# Patient Record
Sex: Female | Born: 1970 | Race: Black or African American | Hispanic: No | Marital: Single | Smoking: Never smoker
Health system: Southern US, Community
[De-identification: ages and names within clinical notes are randomized; demographics above are authoritative.]

## PROBLEM LIST (undated history)

## (undated) ENCOUNTER — Emergency Department (HOSPITAL_COMMUNITY): Admission: EM | Payer: Medicaid Other | Source: Home / Self Care

## (undated) DIAGNOSIS — E43 Unspecified severe protein-calorie malnutrition: Secondary | ICD-10-CM

## (undated) DIAGNOSIS — J189 Pneumonia, unspecified organism: Secondary | ICD-10-CM

## (undated) DIAGNOSIS — C169 Malignant neoplasm of stomach, unspecified: Secondary | ICD-10-CM

## (undated) DIAGNOSIS — R198 Other specified symptoms and signs involving the digestive system and abdomen: Secondary | ICD-10-CM

## (undated) DIAGNOSIS — M3500 Sicca syndrome, unspecified: Secondary | ICD-10-CM

## (undated) DIAGNOSIS — IMO0002 Reserved for concepts with insufficient information to code with codable children: Secondary | ICD-10-CM

## (undated) DIAGNOSIS — T7840XA Allergy, unspecified, initial encounter: Secondary | ICD-10-CM

## (undated) DIAGNOSIS — K219 Gastro-esophageal reflux disease without esophagitis: Secondary | ICD-10-CM

## (undated) DIAGNOSIS — L309 Dermatitis, unspecified: Secondary | ICD-10-CM

## (undated) DIAGNOSIS — D649 Anemia, unspecified: Secondary | ICD-10-CM

## (undated) DIAGNOSIS — R000 Tachycardia, unspecified: Secondary | ICD-10-CM

## (undated) DIAGNOSIS — M329 Systemic lupus erythematosus, unspecified: Secondary | ICD-10-CM

---

## 1997-05-05 ENCOUNTER — Other Ambulatory Visit: Admission: RE | Admit: 1997-05-05 | Discharge: 1997-05-05 | Payer: Self-pay | Admitting: Obstetrics and Gynecology

## 1998-07-18 ENCOUNTER — Emergency Department (HOSPITAL_COMMUNITY): Admission: EM | Admit: 1998-07-18 | Discharge: 1998-07-18 | Payer: Self-pay | Admitting: Emergency Medicine

## 1998-07-18 ENCOUNTER — Encounter: Payer: Self-pay | Admitting: Emergency Medicine

## 1999-04-01 ENCOUNTER — Other Ambulatory Visit: Admission: RE | Admit: 1999-04-01 | Discharge: 1999-04-01 | Payer: Self-pay | Admitting: Family Medicine

## 1999-08-23 ENCOUNTER — Inpatient Hospital Stay (HOSPITAL_COMMUNITY): Admission: EM | Admit: 1999-08-23 | Discharge: 1999-08-24 | Payer: Self-pay | Admitting: Emergency Medicine

## 1999-08-23 ENCOUNTER — Encounter: Payer: Self-pay | Admitting: Internal Medicine

## 2001-01-11 ENCOUNTER — Other Ambulatory Visit: Admission: RE | Admit: 2001-01-11 | Discharge: 2001-01-11 | Payer: Self-pay | Admitting: Obstetrics and Gynecology

## 2001-03-20 ENCOUNTER — Emergency Department (HOSPITAL_COMMUNITY): Admission: EM | Admit: 2001-03-20 | Discharge: 2001-03-20 | Payer: Self-pay | Admitting: *Deleted

## 2007-06-30 ENCOUNTER — Emergency Department (HOSPITAL_COMMUNITY): Admission: EM | Admit: 2007-06-30 | Discharge: 2007-06-30 | Payer: Self-pay | Admitting: Emergency Medicine

## 2011-07-19 ENCOUNTER — Emergency Department (HOSPITAL_COMMUNITY)
Admission: EM | Admit: 2011-07-19 | Discharge: 2011-07-20 | Disposition: A | Discharge status: Home / Self Care | Payer: No Typology Code available for payment source | Attending: Emergency Medicine | Admitting: Emergency Medicine

## 2011-07-19 ENCOUNTER — Encounter (HOSPITAL_COMMUNITY): Payer: Self-pay | Admitting: Emergency Medicine

## 2011-07-19 ENCOUNTER — Emergency Department (INDEPENDENT_AMBULATORY_CARE_PROVIDER_SITE_OTHER)
Admission: EM | Admit: 2011-07-19 | Discharge: 2011-07-19 | Disposition: A | Discharge status: Nursing Home (Includes ICF) | Payer: PRIVATE HEALTH INSURANCE | Source: Home / Self Care | Attending: Family Medicine | Admitting: Family Medicine

## 2011-07-19 ENCOUNTER — Encounter (HOSPITAL_COMMUNITY): Payer: Self-pay | Admitting: *Deleted

## 2011-07-19 DIAGNOSIS — M791 Myalgia, unspecified site: Secondary | ICD-10-CM

## 2011-07-19 DIAGNOSIS — M2550 Pain in unspecified joint: Secondary | ICD-10-CM

## 2011-07-19 DIAGNOSIS — IMO0001 Reserved for inherently not codable concepts without codable children: Secondary | ICD-10-CM | POA: Insufficient documentation

## 2011-07-19 DIAGNOSIS — J45909 Unspecified asthma, uncomplicated: Secondary | ICD-10-CM | POA: Insufficient documentation

## 2011-07-19 HISTORY — DX: Dermatitis, unspecified: L30.9

## 2011-07-19 NOTE — ED Provider Notes (Signed)
History     CSN: [id number]  Arrival date & time 07/19/11  4098   First MD Initiated Contact with Patient 07/19/11 1952      Chief Complaint  Patient presents with  . Joint Pain    (Consider location/radiation/quality/duration/timing/severity/associated sxs/prior treatment) HPI Comments: 41 year old female with history of asthma and eczema. Here complaining of generalized joint pain and swelling. Started with right hip pain about 2 months ago pain progressively worse affecting several joints including right knee and both ankles, shoulders and elbows. She takes ibuprofen over-the-counter 200 mg 5 pills twice a day that her pain has become intractable. She is here complaining of severe generalized joint pain, unable to move or walk due to pain. Denies fever, vomiting, diarrhea. Has had episodes of dizziness. Denies numbness or weakness. Does not have a primary care provider. Denies similar episodes in the past.   Past Medical History  Diagnosis Date  . Asthma   . Eczema     History reviewed. No pertinent past surgical history.  History reviewed. No pertinent family history.  History  Substance Use Topics  . Smoking status: Never Smoker   . Smokeless tobacco: Not on file  . Alcohol Use: Yes    OB History    Grav Para Term Preterm Abortions TAB SAB Ect Mult Living                  Review of Systems  Constitutional: Positive for fatigue. Negative for fever, chills and appetite change.       No sweats, No cold or heat intolerance.  HENT: Positive for neck pain. Negative for sore throat.   Gastrointestinal: Negative for nausea and vomiting.  Genitourinary: Positive for vaginal bleeding. Negative for dysuria, frequency, hematuria and flank pain.       Patient currently with her menstrual period. No polyuria.  Musculoskeletal: Positive for joint swelling and arthralgias.  Skin: Negative for rash.  Neurological: Positive for dizziness. Negative for seizures, weakness,  numbness and headaches.    Allergies  Azithromycin and Sulfa antibiotics  Home Medications   Current Outpatient Rx  Name Route Sig Dispense Refill  . IBUPROFEN 800 MG PO TABS Oral Take 800 mg by mouth every 8 (eight) hours as needed. For pain.      BP 123/59  Pulse 98  Temp(Src) 98.8 F (37.1 C) (Oral)  Resp 20  SpO2 100%  LMP 07/15/2011  Physical Exam  Nursing note and vitals reviewed. Constitutional: She is oriented to person, place, and time. She appears well-developed and well-nourished.       Sitting in wheelchair. Frequent grimacing, appears stiff, robotic-like movements. Responds appropriately to questions.  HENT:  Head: Normocephalic and atraumatic.  Mouth/Throat: Oropharynx is clear and moist. No oropharyngeal exudate.  Eyes: Conjunctivae and EOM are normal. Pupils are equal, round, and reactive to light. No scleral icterus.  Neck: No thyromegaly present.       Limited range of motion due to reported pain  Cardiovascular: Normal heart sounds.   Pulmonary/Chest: Breath sounds normal.  Musculoskeletal:       Generalized joint pain, sever impairment of movement due to reported pain. Patient unable to transfer from chair to table. Unable to walk. No obvious joint swelling on exam.  Lymphadenopathy:    She has no cervical adenopathy.  Neurological: She is alert and oriented to person, place, and time.  Skin: No rash noted.    ED Course  Procedures (including critical care time)  Labs Reviewed - No data to  display No results found.   1. Arthralgia of multiple joints       MDM  41 year old female here with generalized arthralgia can barely moves, unable to walk due to reported severe pain. No primary doctor. Decided to transfer to the emergency department for further evaluation.        Sharin Grave, MD 07/19/11 2230

## 2011-07-19 NOTE — ED Notes (Signed)
Pt with c/o body pain started right leg 2 months ago - now has progressed to left side - from head to toe - taking ibuprofen

## 2011-07-19 NOTE — ED Notes (Signed)
Patient with generalized pain everywhere on body.  Patient mentions feet, joint areas increasing in pain.  Patient states that this has been going on for about one month.

## 2011-07-19 NOTE — ED Notes (Signed)
Pt from wheelchair to exam table and back to wheelchair with assistance moving slowly - c/o pain bottom of feet and back with movement

## 2011-07-20 ENCOUNTER — Encounter (HOSPITAL_COMMUNITY): Payer: Self-pay | Admitting: *Deleted

## 2011-07-20 LAB — URINE MICROSCOPIC-ADD ON

## 2011-07-20 LAB — URINALYSIS, ROUTINE W REFLEX MICROSCOPIC
Bilirubin Urine: NEGATIVE
Glucose, UA: NEGATIVE mg/dL
Ketones, ur: NEGATIVE mg/dL
Nitrite: NEGATIVE
Specific Gravity, Urine: 1.012 (ref 1.005–1.030)
pH: 7.5 (ref 5.0–8.0)

## 2011-07-20 LAB — POCT I-STAT, CHEM 8
BUN: 9 mg/dL (ref 6–23)
Creatinine, Ser: 0.6 mg/dL (ref 0.50–1.10)
Glucose, Bld: 102 mg/dL — ABNORMAL HIGH (ref 70–99)
Sodium: 140 mEq/L (ref 135–145)
TCO2: 27 mmol/L (ref 0–100)

## 2011-07-20 LAB — CBC
HCT: 27.6 % — ABNORMAL LOW (ref 36.0–46.0)
Hemoglobin: 8.7 g/dL — ABNORMAL LOW (ref 12.0–15.0)
MCV: 73.8 fL — ABNORMAL LOW (ref 78.0–100.0)
RBC: 3.74 MIL/uL — ABNORMAL LOW (ref 3.87–5.11)
WBC: 7.8 10*3/uL (ref 4.0–10.5)

## 2011-07-20 LAB — DIFFERENTIAL
Basophils Absolute: 0 10*3/uL (ref 0.0–0.1)
Eosinophils Relative: 1 % (ref 0–5)
Lymphocytes Relative: 29 % (ref 12–46)
Lymphs Abs: 2.3 10*3/uL (ref 0.7–4.0)
Monocytes Absolute: 0.9 10*3/uL (ref 0.1–1.0)
Monocytes Relative: 12 % (ref 3–12)
Neutro Abs: 4.5 10*3/uL (ref 1.7–7.7)

## 2011-07-20 MED ORDER — MELOXICAM 7.5 MG PO TABS: 7.5000 mg | ORAL_TABLET | Freq: Two times a day (BID) | ORAL | Status: DC

## 2011-07-20 MED ORDER — SODIUM CHLORIDE 0.9 % IV BOLUS (SEPSIS)
1000.0000 mL | Freq: Once | INTRAVENOUS | Status: AC
  Administered 2011-07-20: 1000 mL via INTRAVENOUS

## 2011-07-20 NOTE — ED Notes (Signed)
Pt alert, NAD, calm, interactive, skin W&D, resps e/u, speaking in clear complete sentences, pt seen by EDNP prior to RN assessment, see NP notes, orders received and initiated. Pt here for generalized pain, head, neck, joints, back and extremeties, ongoing for ~ 3 months, (denies: fever or nvd).

## 2011-07-20 NOTE — ED Provider Notes (Signed)
History     CSN: [id number]  Arrival date & time 07/19/11  2152   First MD Initiated Contact with Patient 07/20/11 0054      Chief Complaint  Patient presents with  . Joint Pain    (Consider location/radiation/quality/duration/timing/severity/associated sxs/prior treatment) HPI Comments: Andrea Best is a very pleasant 41 year old, African American female, who reports 2 months of progressive myalgias.  She states it started in her right thigh and progressed into her foot and up into her neck.  It is now involving both arms both legs.  His feet.  She is finding it increasingly difficult to ambulate.  She has been taking 5 200 mg tablets of ibuprofen in the morning and repeating this again at night just to get through the day.  He denies appetite change weight loss, dysuria, dyspepsia, probably due to see, polyuria, constipation, diarrhea, shortness of breath, chest pain, visual disturbances, hearing disturbances.  The history is provided by the patient.    Past Medical History  Diagnosis Date  . Asthma   . Eczema     History reviewed. No pertinent past surgical history.  History reviewed. No pertinent family history.  History  Substance Use Topics  . Smoking status: Never Smoker   . Smokeless tobacco: Not on file  . Alcohol Use: Yes    OB History    Grav Para Term Preterm Abortions TAB SAB Ect Mult Living                  Review of Systems  Constitutional: Negative for appetite change and unexpected weight change.  Genitourinary: Negative for menstrual problem.  Musculoskeletal: Positive for myalgias, back pain, arthralgias and gait problem. Negative for joint swelling.  Skin: Negative for pallor and wound.  Neurological: Negative for dizziness and headaches.    Allergies  Azithromycin and Sulfa antibiotics  Home Medications   Current Outpatient Rx  Name Route Sig Dispense Refill  . IBUPROFEN 800 MG PO TABS Oral Take 800 mg by mouth every 8 (eight) hours as  needed. For pain.      BP 115/77  Pulse 96  Temp(Src) 98.4 F (36.9 C) (Oral)  Resp 20  SpO2 100%  LMP 07/15/2011  Physical Exam  Constitutional: She is oriented to person, place, and time. She appears well-developed and well-nourished. No distress.  HENT:  Head: Normocephalic and atraumatic.  Eyes: Pupils are equal, round, and reactive to light.  Neck: Normal range of motion. No thyromegaly present.  Cardiovascular: Normal rate.   Pulmonary/Chest: Effort normal.  Abdominal: She exhibits no distension. There is no tenderness.  Musculoskeletal: Normal range of motion. She exhibits no edema and no tenderness.  Neurological: She is alert and oriented to person, place, and time.  Skin: Skin is warm and dry. No rash noted. There is pallor.    ED Course  Procedures (including critical care time)  Labs Reviewed  CBC - Abnormal; Notable for the following:    RBC 3.74 (*)    Hemoglobin 8.7 (*)    HCT 27.6 (*)    MCV 73.8 (*)    MCH 23.3 (*)    All other components within normal limits  POCT I-STAT, CHEM 8 - Abnormal; Notable for the following:    Glucose, Bld 102 (*)    Hemoglobin 10.2 (*)    HCT 30.0 (*)    All other components within normal limits  DIFFERENTIAL  URINALYSIS, ROUTINE W REFLEX MICROSCOPIC  ANA  LUPUS ANTICOAGULANT PANEL  CK   No  results found.   No diagnosis found.    MDM  During a myositis versus a lupus versus an arthritic process.  I have requested these labs unfortunately, they would not be resulted today .  I did speak with family practice, who has accepted this patient into their practice.  They will call her to set an appointment and also spoken with the patient and her family about this.  They are agreeable I will send this patient home with a prescription for meloxicam 7.5 mg twice a day to replace her 5 tablets of over-the-counter ibuprofen twice a day.        Arman Filter, NP 07/20/11 [phone]

## 2011-07-20 NOTE — ED Provider Notes (Signed)
Medical screening examination/treatment/procedure(s) were performed by non-physician practitioner and as supervising physician I was immediately available for consultation/collaboration.   Sunnie Nielsen, MD 07/20/11 [phone]

## 2011-07-21 LAB — ANTI-NUCLEAR AB-TITER (ANA TITER): ANA Titer 1: 1:320 {titer} — ABNORMAL HIGH

## 2011-07-21 LAB — LUPUS ANTICOAGULANT PANEL: PTT Lupus Anticoagulant: 36.5 secs (ref 28.0–43.0)

## 2011-07-28 ENCOUNTER — Encounter: Payer: Self-pay | Admitting: Family Medicine

## 2011-07-28 ENCOUNTER — Ambulatory Visit (INDEPENDENT_AMBULATORY_CARE_PROVIDER_SITE_OTHER): Payer: PRIVATE HEALTH INSURANCE | Admitting: Family Medicine

## 2011-07-28 VITALS — BP 124/79 | HR 103 | Temp 98.0°F | Ht 65.5 in | Wt 178.0 lb

## 2011-07-28 DIAGNOSIS — M2550 Pain in unspecified joint: Secondary | ICD-10-CM

## 2011-07-28 DIAGNOSIS — Z7689 Persons encountering health services in other specified circumstances: Secondary | ICD-10-CM

## 2011-07-28 MED ORDER — MELOXICAM 15 MG PO TABS: 15.0000 mg | ORAL_TABLET | Freq: Every day | ORAL | Status: AC

## 2011-07-28 NOTE — Patient Instructions (Signed)
It was nice to meet you today! I am sorry you are not feeling well.   Continue taking the Meloxicam 1-2 tablets for the pain. You can also take Tylenol in addition, if you need it. Use heat as needed.  Please take iron tablets for your anemia.  Come back to see me in 2-3 weeks, unless you are feeling all better. We can do some labs at that time if you are still having pain.  Please call if you have fevers, rash, vomiting, joint redness/swelling.  Take care and let me know if you need anything. Dereon Corkery M. Hervey Wedig, M.D.

## 2011-07-28 NOTE — Progress Notes (Signed)
Addended by: Hilarie Fredrickson on: 07/28/2011 04:15 PM   Modules accepted: Orders

## 2011-07-28 NOTE — Assessment & Plan Note (Signed)
Patient has not had PCP in >15 years. Here to establish care. Other than sub acute joint pain (see assessment) patient is overall healthy. PMH of asthma, eczema and anemia. Overall well controlled. Will encourage her to take iron for her anemia.

## 2011-07-28 NOTE — Progress Notes (Signed)
Subjective:     Patient ID: Andrea Best, female   DOB: 07-May-1970, 41 y.o.   MRN: [id number]  HPI Patient is a 41 yo F presenting to clinic as a new patient and a ER follow up appointment Last PCP was around 15 years ago at [facility]. No significant medical problems. When she was acutely ill, she would be seen in Urgent Care for things such as asthma and eczema.  Patient went to the ER 10 days ago for one month history of pains in joints. Mostly in shoulders, arms, back, neck, legs and feet. Overall felt very weak. Since then she states she is somewhat better with Meloxicam, but it has not completely resolved. Biggest concern now is back pain which feels stiff. She also feels some pressure on left side of back. (Able to move right side with little difficulty) She denies any fevers, rashes, bites. She states she had the flu in December and sinus infection in March, but no other recent illnesses. This has never happened before. She has not noticed any swollen or red joints. She has not started taking any new medications recently, other than the Meloxicam that was started after the onset of symptoms. She reports blurred vision when sitting at the computer. She does have some weakness secondary to fatigue, but no focal weakness. No numbness.   Patient was evaluated in ED for this pain. She had UA (unremarkable), CBC which showed anemia (chronic per patient), ANA positive with 1:320 titer and speckled appearance. She is here to follow up theses results as well.  Past Medical History  Diagnosis Date  . Asthma   . Eczema    Past Surgical History  Procedure Date  . Cesarean section     1997   History   Social History  . Marital Status: Single    Spouse Name: N/A    Number of Children: N/A  . Years of Education: College   Occupational History  .     Social History Main Topics  . Smoking status: Never Smoker   . Smokeless tobacco: Not on file  . Alcohol Use: Yes  . Drug Use: No  .  Sexually Active: Not on file   Other Topics Concern  . Not on file   Social History Narrative   Single. Lives with daughter age 21, Dorathy Daft, and patient's mother. 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 Always wears seatbelt.    Review of Systems  Constitutional: Negative for fever and chills.  HENT: Positive for neck pain. Negative for congestion.   Respiratory: Negative for cough and shortness of breath.   Cardiovascular: Negative for chest pain and leg swelling.  Gastrointestinal: Negative for abdominal pain.  Genitourinary: Negative for dysuria.  Musculoskeletal: Positive for back pain and arthralgias. Negative for myalgias and joint swelling.  Skin: Negative for rash.  Neurological: Negative for headaches.       Objective:   Physical Exam  Constitutional: She is oriented to person, place, and time. She appears well-developed and well-nourished. No distress.  HENT:  Head: Normocephalic and atraumatic.  Neck: Normal range of motion.       ROM causes pain when looking down and to the left.  Cardiovascular: Normal rate and regular rhythm.   Pulmonary/Chest: Effort normal and breath sounds normal.  Abdominal: Soft.  Musculoskeletal:       Decreased ROM of joints on left side compared to right, limited by pain. No joint swelling or redness noted, but  does have tenderness to palpation of left shoulder. Strength appears normal limits.  Lymphadenopathy:    She has no cervical adenopathy.  Neurological: She is alert and oriented to person, place, and time. No cranial nerve deficit. Coordination normal.  Skin: Skin is warm and dry. No rash noted.       Assessment:     41 yo F presenting for new patient appointment as well as hospital follow up    Plan:

## 2011-07-28 NOTE — Assessment & Plan Note (Signed)
Unsure etiology of pain at this time. Could be post-viral, or diffuse inflammation from unknown source. Improved on Meloxicam, which we will continue at increased dose to 15mg . Patient is about 4 weeks into course of this illness, but she is improving. Will continue to treat inflammation since she does not have any other red flag symptoms. She will return in 2-3 weeks for follow up appointment. Will consider ESR, CK, and TSH at that visit. If pain persists for long time, will do lose dose of steroids to see if that will help. I anticipate her pain will improve on its own.

## 2011-08-08 ENCOUNTER — Ambulatory Visit (INDEPENDENT_AMBULATORY_CARE_PROVIDER_SITE_OTHER): Payer: PRIVATE HEALTH INSURANCE | Admitting: Family Medicine

## 2011-08-08 ENCOUNTER — Encounter: Payer: Self-pay | Admitting: Family Medicine

## 2011-08-08 VITALS — BP 125/77 | HR 98 | Temp 98.3°F | Ht 65.5 in | Wt 177.0 lb

## 2011-08-08 DIAGNOSIS — M791 Myalgia, unspecified site: Secondary | ICD-10-CM

## 2011-08-08 DIAGNOSIS — IMO0001 Reserved for inherently not codable concepts without codable children: Secondary | ICD-10-CM

## 2011-08-08 DIAGNOSIS — R5383 Other fatigue: Secondary | ICD-10-CM

## 2011-08-08 DIAGNOSIS — M2550 Pain in unspecified joint: Secondary | ICD-10-CM

## 2011-08-08 DIAGNOSIS — R5381 Other malaise: Secondary | ICD-10-CM

## 2011-08-08 LAB — TSH: TSH: 1.204 u[IU]/mL (ref 0.350–4.500)

## 2011-08-08 LAB — SEDIMENTATION RATE: Sed Rate: 129 mm/hr — ABNORMAL HIGH (ref 0–22)

## 2011-08-08 NOTE — Patient Instructions (Signed)
It was great to see you today!  Please continue to take the Meloxicam for pain. You can take Flexeril at night to help with sleep, and take Tylenol during the day.  I will call you if your labs are abnormal, otherwise I will send you a letter.  Please keep your legs elevated at night or when you can during the day.  Please come back to see me in 1-2 months, or earlier if you need anything!  Take care! Jadin Creque M. Lazaria Schaben, M.D.

## 2011-08-09 NOTE — Progress Notes (Signed)
Subjective:     Patient ID: Andrea Best, female   DOB: 05-23-1970, 41 y.o.   MRN: [id number]  HPI Patient returns to the office today for re-evaluation of her subacute pain. She is taking Meloxicam daily and states it has helped some but she continues to have daily symptoms that wax/wane. Her symptoms tend to be on the left side as before. Her main complaint is a pain on her left side of her midback that she describes as a "spasm" with movement. Her left shoulder pain has improved. She does have a new complaint of bilateral ankle swelling. On further review, she has been standing/walking more in the last week. This ankle swelling has caused her to have difficulty walking. She denies fevers, chills, rashes, sore throat, joint swelling or redness. Overall, she seems concerned that she continues to have this pain with no explantation.   Smoking history reviewed. (Never smoker)  Review of Systems  Constitutional: Positive for fatigue. Negative for fever and chills.  HENT: Negative for sore throat.   Musculoskeletal: Positive for myalgias, arthralgias and gait problem.  Neurological: Positive for weakness. Negative for headaches.       Objective:   Physical Exam  Constitutional: She appears well-developed and well-nourished. No distress.  HENT:  Head: Normocephalic and atraumatic.  Cardiovascular: Normal rate and regular rhythm.   Pulmonary/Chest: Effort normal and breath sounds normal.  Abdominal: Soft. There is no tenderness.  Musculoskeletal: She exhibits no edema.       Improved ROM of shoulders. Pain with twisting of back, but no tenderness to palpation. Decreased strength of left lower extremity compared to right. No pitting edema of ankles; appear "tight" but no swelling noted  Lymphadenopathy:    She has no cervical adenopathy.       Assessment:     41 yo F with pain    Plan:

## 2011-08-09 NOTE — Assessment & Plan Note (Signed)
Patient continues to have pain. Taking Meloxicam 15mg  daily, plus Tylenol which does help somewhat. Concerned about not sleeping. States she can take Flexeril at night if needed to help with back pain and sleep.  Will check ESR, CK and TSH today. Encouraged patient to continue moving and taking medications. This could still be viral in nature. Also, will consider starting SSRI (discussed with patient today, and not interested at this time.) Can also consider prednisone for treatment as well. Will follow up in 6-8 weeks.

## 2011-09-14 ENCOUNTER — Ambulatory Visit: Payer: PRIVATE HEALTH INSURANCE | Admitting: Family Medicine

## 2012-03-04 ENCOUNTER — Encounter: Payer: Self-pay | Admitting: Family Medicine

## 2012-03-04 ENCOUNTER — Ambulatory Visit (INDEPENDENT_AMBULATORY_CARE_PROVIDER_SITE_OTHER): Payer: 59 | Admitting: Family Medicine

## 2012-03-04 VITALS — BP 127/63 | HR 92 | Temp 98.1°F | Ht 66.0 in | Wt 182.5 lb

## 2012-03-04 DIAGNOSIS — R05 Cough: Secondary | ICD-10-CM

## 2012-03-04 NOTE — Assessment & Plan Note (Signed)
Post viral cough with a component of postnasal drainage,.  Discussed symptomatic care, time limited course.  Denies allergic symptoms.  Discussed red flags for follow-up

## 2012-03-04 NOTE — Patient Instructions (Addendum)
Andrea Best Med Sinus rinse.- use distilled or sterilized water  Ok to try sudafed to drainage  Try to limit throat clearing,  Drink lots of fluids  Try honey cough cough and throat.  Typically lasts 4-6 weeks after cold

## 2012-03-04 NOTE — Progress Notes (Signed)
  Subjective:    Patient ID: Andrea Best, female    DOB: 18-Jul-1970, 41 y.o.   MRN: [id number]  HPI  Sinus pressure, cough, congestion x 2 weeks  Started as nasal congestion, hacking cough, hoarseness, fatigue.  Depending on the weather, becomes hoarse on days she does more coughing.  Lots of throat clearing.  Feels throat tickling.  Overall improving.    I have reviewed patient's  PMH, FH, and Social history and Medications as related to this visit. Nonsmoker. Review of Systems No fever, of dyspnea.      Objective:   Physical Exam  GEN: Alert & Oriented, No acute distress HEENT: [place]/AT. EOMI, PERRLA, no conjunctival injection or scleral icterus.  Bilateral tympanic membranes intact without erythema or effusion.  .  Nares without edema or rhinorrhea but some nasal crusting.  Oropharynx is without erythema or exudates.  No anterior or posterior cervical lymphadenopathy. CV:  Regular Rate & Rhythm, no murmur Respiratory:  Normal work of breathing, CTAB       Assessment & Plan:

## 2013-05-26 ENCOUNTER — Other Ambulatory Visit: Payer: Self-pay | Admitting: Family Medicine

## 2013-05-26 ENCOUNTER — Encounter: Payer: Self-pay | Admitting: Family Medicine

## 2013-05-26 ENCOUNTER — Ambulatory Visit (INDEPENDENT_AMBULATORY_CARE_PROVIDER_SITE_OTHER): Payer: 59 | Admitting: Family Medicine

## 2013-05-26 VITALS — BP 129/76 | HR 111 | Temp 98.4°F | Ht 66.0 in | Wt 184.0 lb

## 2013-05-26 DIAGNOSIS — M2550 Pain in unspecified joint: Secondary | ICD-10-CM

## 2013-05-26 LAB — POCT SEDIMENTATION RATE: POCT SED RATE: 94 mm/h — AB (ref 0–22)

## 2013-05-26 LAB — COMPREHENSIVE METABOLIC PANEL
ALBUMIN: 3.3 g/dL — AB (ref 3.5–5.2)
ALK PHOS: 88 U/L (ref 39–117)
ALT: 47 U/L — AB (ref 0–35)
AST: 45 U/L — AB (ref 0–37)
BUN: 11 mg/dL (ref 6–23)
CALCIUM: 8.9 mg/dL (ref 8.4–10.5)
CHLORIDE: 104 meq/L (ref 96–112)
CO2: 25 mEq/L (ref 19–32)
Creat: 0.63 mg/dL (ref 0.50–1.10)
Glucose, Bld: 85 mg/dL (ref 70–99)
POTASSIUM: 3.9 meq/L (ref 3.5–5.3)
SODIUM: 136 meq/L (ref 135–145)
TOTAL PROTEIN: 8.3 g/dL (ref 6.0–8.3)
Total Bilirubin: 0.3 mg/dL (ref 0.2–1.2)

## 2013-05-26 LAB — CBC
HCT: 24.1 % — ABNORMAL LOW (ref 36.0–46.0)
Hemoglobin: 8.1 g/dL — ABNORMAL LOW (ref 12.0–15.0)
MCH: 25.7 pg — AB (ref 26.0–34.0)
MCHC: 33.6 g/dL (ref 30.0–36.0)
MCV: 76.5 fL — AB (ref 78.0–100.0)
PLATELETS: 679 10*3/uL — AB (ref 150–400)
RBC: 3.15 MIL/uL — ABNORMAL LOW (ref 3.87–5.11)
RDW: 23.3 % — AB (ref 11.5–15.5)
WBC: 7 10*3/uL (ref 4.0–10.5)

## 2013-05-26 LAB — TSH: TSH: 1.093 u[IU]/mL (ref 0.350–4.500)

## 2013-05-26 MED ORDER — MELOXICAM 15 MG PO TABS: 15.0000 mg | ORAL_TABLET | Freq: Every day | ORAL | Status: DC

## 2013-05-26 NOTE — Patient Instructions (Signed)
Good to see you today!  Thanks for coming in.  I will call you if your lab tests are not normal.  Otherwise we will discuss them at your next visit.  See Dr Sheral Apley in 1-2 weeks  Stop all ibuprofen take one mobic per day with tylenol as needed

## 2013-05-26 NOTE — Progress Notes (Signed)
Subjective:     Patient ID: Andrea Best, female   DOB: [birth year], 43 y.o.   MRN: [id number]  HPI:  Andrea Best is a 43 year old female that presents with a 2 month history of diffuse joint pain.  The pain is polyarticular, affecting both ankles, both knees, the left elbow, the left hand (MCP, DIP joints more than the PIP), and both shoulders.  The pain is worse when she first wakes up in the morning and is generally not relieved by rest. Feels stiff for several hours.  It worsens with physical activity and exercise.  It also keeps her up at night.   She was previously diagnosed in  May of 2013 with "myalgia," and given Meloxicam (Mobic).Her symptoms and pain completely resolved after April of 2014.  She had been pain free until this past January 2015.  Currently, she is managing the pain with 4-5 Advils q 6-7 hours.    She notes bilateral lower extremity muscle weakness/tiredness that has been present over the last 2 months.  It is not ascending/descending and is associated with use of the leg muscles.    There is a family history of lupus (maternal aunt) and "arthritis" (mother, unsure what kind).  The patient has never been a smoker, infrequently uses alcohol, and is not on OCP medication.     Review of Systems No new rashes, no changes in urination, no fevers, no recent infections, weight loss     Objective:   Physical Exam Neuro Gait:  Able to walk on toes with significant pain.  Able to walk on heels.  Gait appeared to be normal swing and stance phase. 4-5+ muscle strength in both lower extremities, 5+ in upper extremities. Unable to make a fist with the L hand   MSK: Ankles/Feet:  No increased swelling or erythema around the joints.  Joints slightly tender to palpation.  No pain on dorsiflexion or plantarflexion.  Able to roll the ankles.  No podagra or nodules on foot. Knees:  L knee slightly more swollen than the R knee.  General tenderness around both knee joints (above and  below), but no single point tenderness. Hips:  R hip slightly painful on palpation Hands:  L hand has significant joint tenderness at the MCP joints.  Mild tenderness at the DIP and PIP joints.   Shoulders:  Diffuse L shoulder pain when raise L arm.  Skin:  Intact without suspicious lesions or rashes Neck:  No deformities, thyromegaly, masses, or tenderness noted.   Supple with full range of motion without pain.      Assessment:      Plan:

## 2013-05-26 NOTE — Assessment & Plan Note (Addendum)
Ms Andrea Best is a 43 year old female that presents with a 2 month history of diffuse joint pain.  When she was evaluated in 2013, ESR was 129, and ANA was positive.  Urinalysis showed a large amount of blood in the urine.  Despite being diagnosed with myalgia at this point, the patient complains more of joint pain.  The polyarticular, chronic nature of this pain, coupled with the history, leads to a probable inflammatory or immunologic condition.  With a positive ANA will check DSDNA and CMET to look for renal involvement and CBC for anemia . Rheumatoid arthritis is a consideration.  Fibromyalgia is also a possibility.  Blood work should be repeated while a cause is delineated.   1. Switch back to Meloxicam for joint pain management.   2. Discontinue use of Ibuprofen. 3. Labs:  -CBC  -TSH  -CMP  -ESR and CRP  - Anti DS DNA 4. Follow up with Dr. Sheral Apley in 2 weeks.   Should check a UA at that time

## 2013-05-27 LAB — ANTI-DNA ANTIBODY, DOUBLE-STRANDED: ds DNA Ab: 21 IU/mL — ABNORMAL HIGH

## 2013-05-29 ENCOUNTER — Telehealth: Payer: Self-pay | Admitting: Family Medicine

## 2013-05-29 NOTE — Telephone Encounter (Signed)
Left voicemail to call us to discuss her lab results

## 2013-05-29 NOTE — Telephone Encounter (Signed)
Pt called back. She would like to be called back today

## 2013-05-30 ENCOUNTER — Telehealth: Payer: Self-pay | Admitting: Family Medicine

## 2013-05-30 DIAGNOSIS — M2550 Pain in unspecified joint: Secondary | ICD-10-CM

## 2013-05-30 MED ORDER — FLUTICASONE PROPIONATE 50 MCG/ACT NA SUSP: 2.0000 | Freq: Every day | NASAL | Status: DC

## 2013-05-30 NOTE — Telephone Encounter (Signed)
Put in referral to Dr Charlestine Night

## 2013-05-30 NOTE — Telephone Encounter (Signed)
Pt called because she was told to get the numbers of physicians that is in her insurance plan so that she can get a referral for an Rheumatologist. Dr. Hurley Cisco [phone], Dr. Unice Bailey [phone], and DR. Hermelinda Medicus [phone]. She was seen be Dr. Erin Hearing on 3/2. jw

## 2013-05-30 NOTE — Telephone Encounter (Signed)
Called back left voicemail

## 2013-05-30 NOTE — Telephone Encounter (Signed)
Spoke with her.  About the same but having nasal congestion like her allergies in past that flonase helped  She will call with names of rheumatologists that take her insurance  Urged her to keep appointment with Dr Sheral Apley

## 2013-05-30 NOTE — Telephone Encounter (Signed)
When patient calls back what would you like for [place] to tell her about results?  Koji Niehoff,CMA

## 2013-06-06 NOTE — Telephone Encounter (Signed)
Dr. Neita Goodnight office will not be able see patient until the end of April due to MD going out of the country. Patient is requesting a referral to one of the other MD she gave in the msg below.

## 2013-06-06 NOTE — Telephone Encounter (Signed)
Please change referral to [facility] and notify pt  Thanks [facility]

## 2013-06-10 NOTE — Telephone Encounter (Signed)
Referral faxed to Dr. Ouida Sills.   They will contact pt.   [place]

## 2013-06-12 ENCOUNTER — Encounter: Payer: Self-pay | Admitting: Family Medicine

## 2013-06-12 ENCOUNTER — Ambulatory Visit (INDEPENDENT_AMBULATORY_CARE_PROVIDER_SITE_OTHER): Payer: 59 | Admitting: Family Medicine

## 2013-06-12 VITALS — BP 138/88 | HR 106 | Temp 99.2°F | Ht 66.0 in | Wt 179.0 lb

## 2013-06-12 DIAGNOSIS — R109 Unspecified abdominal pain: Secondary | ICD-10-CM

## 2013-06-12 DIAGNOSIS — M359 Systemic involvement of connective tissue, unspecified: Secondary | ICD-10-CM

## 2013-06-12 DIAGNOSIS — R1013 Epigastric pain: Secondary | ICD-10-CM

## 2013-06-12 DIAGNOSIS — D8989 Other specified disorders involving the immune mechanism, not elsewhere classified: Secondary | ICD-10-CM

## 2013-06-12 DIAGNOSIS — K3189 Other diseases of stomach and duodenum: Secondary | ICD-10-CM

## 2013-06-12 DIAGNOSIS — M2550 Pain in unspecified joint: Secondary | ICD-10-CM

## 2013-06-12 LAB — POCT URINALYSIS DIPSTICK
Bilirubin, UA: NEGATIVE
Glucose, UA: NEGATIVE
KETONES UA: NEGATIVE
Nitrite, UA: NEGATIVE
PROTEIN UA: 30
RBC UA: NEGATIVE
SPEC GRAV UA: 1.025
UROBILINOGEN UA: 0.2
pH, UA: 7

## 2013-06-12 LAB — POCT UA - MICROSCOPIC ONLY

## 2013-06-12 MED ORDER — OMEPRAZOLE 40 MG PO CPDR: 40.0000 mg | DELAYED_RELEASE_CAPSULE | Freq: Every day | ORAL | Status: DC

## 2013-06-12 NOTE — Assessment & Plan Note (Signed)
Most likely GERD, which could be exacerbated by NSAID use. Will start prilosec daily to see if her symptoms improve. F/u in 1 month.

## 2013-06-12 NOTE — Addendum Note (Signed)
Addended by: Martin[place], Tayron Hunnell on: 06/12/2013 03:03 PM   Modules accepted: Orders

## 2013-06-12 NOTE — Progress Notes (Signed)
Patient ID: Andrea Best, female   DOB: June 11, 1970, 43 y.o.   MRN: [id number]    Subjective: HPI: Patient is a 43 y.o. female presenting to clinic today for follow up appointment. Concerns today include stomach issues  1. Autoimmune disorder - Patient with joint pain, elevated Sed rate and elevated DSDNA. Awaiting rheumatology appointment. Overall, improved some. She is taking Mobic daily with relief. She is able to move better but she does have still have joint pain. She is aware of her results and we discussed them again today. Will need to give UA to eval for proteinuria today.   2. Stomach pain - Patient with intermittent burning in her throat and sharp epigastric pain after eating. She has never been diagnosed with GERD and has not been on medication. She is taking NSAID for joint pains. She does have a hoarse voice currently, but thinks it is from a URI. No vomiting, no fevers, no diarrhea.   History Reviewed: Non smoker. Health Maintenance: Needs pap smear  ROS: Please see HPI above.  Objective: Office vital signs reviewed. BP 138/88  Pulse 106  Temp(Src) 99.2 F (37.3 C) (Oral)  Ht 5\' 6"  (1.676 m)  Wt 179 lb (81.194 kg)  BMI 28.91 kg/m2  LMP 06/06/2013  Physical Examination:  General: Awake, alert. NAD.  HEENT: Atraumatic, normocephalic. MMM. Hoarse voice, postnasal drip. Pulm: CTAB, no wheezes Cardio: RRR, no murmurs appreciated Abdomen:+BS, soft, nontender, nondistended Extremities: No edema. Hyperpigmentation of second toes and pointer fingers.  Neuro: Sensation grossly intact. Strength equal bilaterally, but seems subjectively weak  Assessment: 43 y.o. female follow up  Plan: See Problem List and After Visit Summary

## 2013-06-12 NOTE — Patient Instructions (Signed)
Please come back to see me after you see the rheumatologist.  Start taking Prilosec every day.  Amber M. Hairford, M.D.

## 2013-08-25 ENCOUNTER — Other Ambulatory Visit: Payer: Self-pay | Admitting: Family Medicine

## 2013-08-25 ENCOUNTER — Other Ambulatory Visit (HOSPITAL_COMMUNITY)
Admission: RE | Admit: 2013-08-25 | Discharge: 2013-08-25 | Disposition: A | Discharge status: Home / Self Care | Payer: 59 | Source: Ambulatory Visit | Attending: Family Medicine | Admitting: Family Medicine

## 2013-08-25 DIAGNOSIS — Z1151 Encounter for screening for human papillomavirus (HPV): Secondary | ICD-10-CM | POA: Diagnosis present

## 2013-08-25 DIAGNOSIS — Z124 Encounter for screening for malignant neoplasm of cervix: Secondary | ICD-10-CM | POA: Diagnosis present

## 2013-08-25 DIAGNOSIS — R87810 Cervical high risk human papillomavirus (HPV) DNA test positive: Secondary | ICD-10-CM | POA: Insufficient documentation

## 2013-08-27 LAB — CYTOLOGY - PAP

## 2013-09-22 ENCOUNTER — Other Ambulatory Visit: Payer: Self-pay | Admitting: Gastroenterology

## 2013-09-22 DIAGNOSIS — R198 Other specified symptoms and signs involving the digestive system and abdomen: Secondary | ICD-10-CM

## 2013-09-22 HISTORY — DX: Other specified symptoms and signs involving the digestive system and abdomen: R19.8

## 2013-09-25 ENCOUNTER — Other Ambulatory Visit (HOSPITAL_COMMUNITY): Payer: Self-pay | Admitting: Gastroenterology

## 2013-09-25 DIAGNOSIS — C169 Malignant neoplasm of stomach, unspecified: Secondary | ICD-10-CM

## 2013-09-29 ENCOUNTER — Other Ambulatory Visit: Payer: Self-pay | Admitting: Obstetrics and Gynecology

## 2013-10-02 ENCOUNTER — Other Ambulatory Visit: Payer: Self-pay | Admitting: Gastroenterology

## 2013-10-02 DIAGNOSIS — C169 Malignant neoplasm of stomach, unspecified: Secondary | ICD-10-CM

## 2013-10-03 ENCOUNTER — Ambulatory Visit
Admission: RE | Admit: 2013-10-03 | Discharge: 2013-10-03 | Disposition: A | Discharge status: Home / Self Care | Payer: 59 | Source: Ambulatory Visit | Attending: Gastroenterology | Admitting: Gastroenterology

## 2013-10-03 ENCOUNTER — Encounter (HOSPITAL_COMMUNITY)
Admission: RE | Admit: 2013-10-03 | Discharge: 2013-10-03 | Disposition: A | Discharge status: Home / Self Care | Payer: 59 | Source: Ambulatory Visit | Attending: Gastroenterology | Admitting: Gastroenterology

## 2013-10-03 DIAGNOSIS — C169 Malignant neoplasm of stomach, unspecified: Secondary | ICD-10-CM

## 2013-10-03 MED ORDER — IOHEXOL 300 MG/ML  SOLN
100.0000 mL | Freq: Once | INTRAMUSCULAR | Status: AC | PRN
  Administered 2013-10-03: 100 mL via INTRAVENOUS

## 2013-10-06 ENCOUNTER — Telehealth: Payer: Self-pay | Admitting: Oncology

## 2013-10-06 NOTE — Telephone Encounter (Signed)
LEFT MESSAGE FOR PATIENT AND GAVE NP APPT FOR 07/14 @ 1:45 W/DR. [place].

## 2013-10-06 NOTE — Telephone Encounter (Signed)
PATIENT CALLED TO CONFIRMED NP APPT ON 07/14 @ 1:45.

## 2013-10-07 ENCOUNTER — Telehealth: Payer: Self-pay | Admitting: *Deleted

## 2013-10-07 ENCOUNTER — Ambulatory Visit (HOSPITAL_BASED_OUTPATIENT_CLINIC_OR_DEPARTMENT_OTHER): Payer: 59 | Admitting: Nurse Practitioner

## 2013-10-07 ENCOUNTER — Ambulatory Visit: Payer: 59

## 2013-10-07 VITALS — BP 117/65 | HR 92 | Temp 98.1°F | Resp 18 | Ht 66.0 in | Wt 154.3 lb

## 2013-10-07 DIAGNOSIS — R109 Unspecified abdominal pain: Secondary | ICD-10-CM

## 2013-10-07 DIAGNOSIS — K311 Adult hypertrophic pyloric stenosis: Secondary | ICD-10-CM

## 2013-10-07 DIAGNOSIS — M329 Systemic lupus erythematosus, unspecified: Secondary | ICD-10-CM

## 2013-10-07 DIAGNOSIS — D509 Iron deficiency anemia, unspecified: Secondary | ICD-10-CM

## 2013-10-07 DIAGNOSIS — C169 Malignant neoplasm of stomach, unspecified: Secondary | ICD-10-CM

## 2013-10-07 DIAGNOSIS — M3500 Sicca syndrome, unspecified: Secondary | ICD-10-CM

## 2013-10-07 DIAGNOSIS — C163 Malignant neoplasm of pyloric antrum: Secondary | ICD-10-CM

## 2013-10-07 DIAGNOSIS — R112 Nausea with vomiting, unspecified: Secondary | ICD-10-CM

## 2013-10-07 NOTE — Progress Notes (Addendum)
[facility] Patient Consult   Referring MD: Dr. Karmen Bongo 43 y.o.  1970/12/23    Reason for Referral: Gastric cancer   HPI: Ms. Petrella is a 43 year old woman with lupus and Sjogren's recently diagnosed with gastric cancer. She reports a 3 to four-month history of acid reflux and nausea/vomiting. She has lost about 30 pounds in that same time frame. She has a good appetite but notes early satiety as well as an uncomfortable sensation when she eats. She noted some improvement in symptoms since beginning Prevacid. She had a change in bowel habits about 2-3 months ago with constipation. Stools have been "dark".  She was referred to Dr. Paulita Fujita and underwent an upper endoscopy on 09/22/2013 with findings of esophagitis, retained gastric contents and a partially obstructing gastric ulcer in the gastric antrum. The ulcerated region was biopsied. The first and second part of the duodenum were normal.  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  Staging CT scans 10/03/2013 showed groundglass opacity measuring 7 x 5 mm posteriorly in the superior segment of the right lower lobe; 4.5 mm nodular density in the right upper lobe; bilateral axillary adenopathy with the largest lymph node measuring 13 x 11 mm in the right axillary region; low density area measuring 30 x 21 mm anteriorly in the left hepatic lobe most consistent with fatty infiltration; another hypodense area measuring 27 x 24 mm within the medial segment left hepatic lobe; severe gastric distention with a large amount of complex material seen in the body and antrum of the stomach; mild bilateral inguinal adenopathy with the largest lymph node measuring 10 x 9 mm on the right side.  Past medical history: 1. Lupus 2. Sjgren's   Past Surgical History  Procedure Laterality Date  . Cesarean section      1997    Medications: Reviewed  Allergies:    Allergies  Allergen Reactions  . Azithromycin Anaphylaxis  . Sulfa Antibiotics Anaphylaxis    Family history: Maternal aunt diagnosed with stomach cancer in her 51s. No other family history of cancer.  Social History: She lives in [place]. She is single. She has a 48 year old daughter who is in good health. She is employed as a Media planner. No tobacco use. No alcohol intake since February of 2015. Prior to that she drank alcohol occasionally.  ROS: 30 pound weight loss over the past 3-4 months. Acid reflux and nausea/vomiting over the past 3-4 months. Early satiety. Uncomfortable sensation in the abdomen with eating. No fevers. She has occasional hot flashes at night. No unusual headaches. Chronic vision problems. No dysphagia. No shortness of breath. Intermittent cough. No chest pain. She denies leg swelling and calf pain. No significant joint pains. Change in bowel habits with constipation 2-3 months ago. Stools are dark. No hematuria or dysuria. No numbness or tingling in her hands or feet.  Physical Exam:  Blood pressure 117/65, pulse 92, temperature 98.1 F (36.7 C), temperature source Oral, resp. rate 18, height 5\' 6"  (1.676 m), weight 154 lb 4.8 oz (69.99 kg), SpO2 100.00%.  HEENT: Pupils equal round and reactive to light. Extraocular movements intact. Sclera anicteric. Oropharynx without thrush or ulceration. Lungs: Clear bilaterally. No wheezes or rales. Cardiac: Regular cardiac rhythm. No murmur. Abdomen: Soft with mild tenderness at the right upper abdomen. No organomegaly. No mass. Vascular: No leg edema. Calves soft and nontender. Lymph nodes: No palpable cervical, supraclavicular, axillary or inguinal lymph nodes.  LAB:  CBC  Lab Results  Component Value Date   WBC 7.0 05/26/2013   HGB 8.1* 05/26/2013   HCT 24.1* 05/26/2013   MCV 76.5* 05/26/2013   PLT 679* 05/26/2013   NEUTROABS 4.5 07/20/2011     CMP      Component Value Date/Time   NA 136  05/26/2013 1517   K 3.9 05/26/2013 1517   CL 104 05/26/2013 1517   CO2 25 05/26/2013 1517   GLUCOSE 85 05/26/2013 1517   BUN 11 05/26/2013 1517   CREATININE 0.63 05/26/2013 1517   CREATININE 0.60 07/20/2011 0056   CALCIUM 8.9 05/26/2013 1517   PROT 8.3 05/26/2013 1517   ALBUMIN 3.3* 05/26/2013 1517   AST 45* 05/26/2013 1517   ALT 47* 05/26/2013 1517   ALKPHOS 88 05/26/2013 1517   BILITOT 0.3 05/26/2013 1517    Assessment/Plan:   1. Gastric cancer status post upper endoscopy 09/22/2013 with findings of a partially obstructing oozing cratered gastric ulcer in the gastric antrum.   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  CT scans chest/abdomen/pelvis 10/03/2013 showed a 7 x 5 mm groundglass opacity in the superior segment of the right lower lobe; a 4.5 mm nodular density in the right upper lobe; bilateral axillary adenopathy; low density area anteriorly in the left hepatic lobe most consistent with fatty infiltration; another hypodense area within the medial segment of the left hepatic lobe; severe gastric distention; mild bilateral inguinal adenopathy. 2. Gastric outlet obstruction secondary to #1. 3. Nausea/vomiting secondary to #2. 4. Abdominal pain secondary to #1 and #2. 5. Weight loss secondary to #1 and #2. 6. Microcytic anemia, likely iron deficiency, at least partially due to bleeding from the gastric mass. 7. Lupus. 8. Sjgren's.  Disposition:   Ms. Rager has been diagnosed with gastric cancer. She has a gastric outlet obstruction. She is symptomatic with nausea/vomiting and abdominal pain. She has lost a significant amount of weight.   Dr. Benay Spice recommends hospitalization for initiation of TPN and surgical consultation. She declined hospitalization tonight. She is agreeable to return on 10/08/2013 to be admitted. Dr. Benay Spice has spoken with Dr. Excell Seltzer regarding her case. Dr. Excell Seltzer will see her in the hospital.   Patient seen with Dr. Benay Spice.  CT images reviewed on the computer with Ms. Iorio and her mother. 50 minutes were spent face-to-face at today's visit with the majority of that time involved in counseling/coordination of care.  Ned Card, ANP/GNP-BC 10/07/2013, 3:12 PM  This was a shared visit with Ned Card. Ms. Rawlinson was interviewed and examined. The upper endoscopy report, pathology report, and CT images were reviewed. I reviewed the 10/03/2013 CT images with the patient and her mother.  She has been diagnosed with gastric cancer. She is symptomatic with gastric outlet obstruction. The small axillary nodes and parenchymal lung changes are most likely unrelated to the gastric cancer diagnosis. There is an indeterminate area in the medial left liver.  I discussed the case with Dr. Excell Seltzer. He agrees she should be evaluated for primary resection. A palliative bypass procedure or palliative feeding tube can be considered if the primary tumor is unresectable or she has obvious metastatic disease.. I recommend hospital admission for TNA and an expedient surgical evaluation. We will consider obtaining a PET scan or liver MRI prior to surgery.  I contacted Dr. Charlies Silvers and she agrees to admit Ms. Gurganus to the medical service.  Julieanne Manson, M.D.

## 2013-10-07 NOTE — Telephone Encounter (Addendum)
Dr. Benay Spice discussed case with Dr. Charlies Silvers. Pt to be admitted to [facility] 10/08/13. Bed requested.

## 2013-10-08 ENCOUNTER — Telehealth: Payer: Self-pay | Admitting: *Deleted

## 2013-10-08 ENCOUNTER — Encounter (HOSPITAL_COMMUNITY): Payer: Self-pay | Admitting: Internal Medicine

## 2013-10-08 ENCOUNTER — Inpatient Hospital Stay (HOSPITAL_COMMUNITY)
Admission: AD | Admit: 2013-10-08 | Discharge: 2013-10-27 | DRG: 326 | Disposition: A | Discharge status: Home Health | Payer: 59 | Source: Ambulatory Visit | Attending: Internal Medicine | Admitting: Internal Medicine

## 2013-10-08 ENCOUNTER — Inpatient Hospital Stay (HOSPITAL_COMMUNITY): Payer: 59

## 2013-10-08 DIAGNOSIS — R112 Nausea with vomiting, unspecified: Secondary | ICD-10-CM | POA: Diagnosis present

## 2013-10-08 DIAGNOSIS — Y836 Removal of other organ (partial) (total) as the cause of abnormal reaction of the patient, or of later complication, without mention of misadventure at the time of the procedure: Secondary | ICD-10-CM | POA: Diagnosis not present

## 2013-10-08 DIAGNOSIS — D62 Acute posthemorrhagic anemia: Secondary | ICD-10-CM | POA: Diagnosis not present

## 2013-10-08 DIAGNOSIS — R634 Abnormal weight loss: Secondary | ICD-10-CM | POA: Diagnosis present

## 2013-10-08 DIAGNOSIS — D649 Anemia, unspecified: Secondary | ICD-10-CM

## 2013-10-08 DIAGNOSIS — R339 Retention of urine, unspecified: Secondary | ICD-10-CM | POA: Diagnosis not present

## 2013-10-08 DIAGNOSIS — K311 Adult hypertrophic pyloric stenosis: Principal | ICD-10-CM

## 2013-10-08 DIAGNOSIS — D72829 Elevated white blood cell count, unspecified: Secondary | ICD-10-CM

## 2013-10-08 DIAGNOSIS — R5082 Postprocedural fever: Secondary | ICD-10-CM | POA: Diagnosis not present

## 2013-10-08 DIAGNOSIS — J189 Pneumonia, unspecified organism: Secondary | ICD-10-CM

## 2013-10-08 DIAGNOSIS — R599 Enlarged lymph nodes, unspecified: Secondary | ICD-10-CM | POA: Diagnosis present

## 2013-10-08 DIAGNOSIS — J45909 Unspecified asthma, uncomplicated: Secondary | ICD-10-CM | POA: Diagnosis present

## 2013-10-08 DIAGNOSIS — K259 Gastric ulcer, unspecified as acute or chronic, without hemorrhage or perforation: Secondary | ICD-10-CM | POA: Diagnosis present

## 2013-10-08 DIAGNOSIS — Z8249 Family history of ischemic heart disease and other diseases of the circulatory system: Secondary | ICD-10-CM | POA: Diagnosis not present

## 2013-10-08 DIAGNOSIS — IMO0002 Reserved for concepts with insufficient information to code with codable children: Secondary | ICD-10-CM | POA: Diagnosis not present

## 2013-10-08 DIAGNOSIS — M329 Systemic lupus erythematosus, unspecified: Secondary | ICD-10-CM | POA: Diagnosis present

## 2013-10-08 DIAGNOSIS — E876 Hypokalemia: Secondary | ICD-10-CM | POA: Diagnosis not present

## 2013-10-08 DIAGNOSIS — D500 Iron deficiency anemia secondary to blood loss (chronic): Secondary | ICD-10-CM | POA: Diagnosis present

## 2013-10-08 DIAGNOSIS — R000 Tachycardia, unspecified: Secondary | ICD-10-CM

## 2013-10-08 DIAGNOSIS — Z881 Allergy status to other antibiotic agents status: Secondary | ICD-10-CM

## 2013-10-08 DIAGNOSIS — Z79899 Other long term (current) drug therapy: Secondary | ICD-10-CM

## 2013-10-08 DIAGNOSIS — M3500 Sicca syndrome, unspecified: Secondary | ICD-10-CM | POA: Diagnosis present

## 2013-10-08 DIAGNOSIS — C169 Malignant neoplasm of stomach, unspecified: Secondary | ICD-10-CM | POA: Diagnosis present

## 2013-10-08 DIAGNOSIS — E43 Unspecified severe protein-calorie malnutrition: Secondary | ICD-10-CM

## 2013-10-08 DIAGNOSIS — R109 Unspecified abdominal pain: Secondary | ICD-10-CM

## 2013-10-08 DIAGNOSIS — IMO0001 Reserved for inherently not codable concepts without codable children: Secondary | ICD-10-CM

## 2013-10-08 DIAGNOSIS — J90 Pleural effusion, not elsewhere classified: Secondary | ICD-10-CM

## 2013-10-08 DIAGNOSIS — Y95 Nosocomial condition: Secondary | ICD-10-CM

## 2013-10-08 DIAGNOSIS — R509 Fever, unspecified: Secondary | ICD-10-CM

## 2013-10-08 DIAGNOSIS — B3789 Other sites of candidiasis: Secondary | ICD-10-CM | POA: Diagnosis present

## 2013-10-08 DIAGNOSIS — K651 Peritoneal abscess: Secondary | ICD-10-CM

## 2013-10-08 DIAGNOSIS — Z833 Family history of diabetes mellitus: Secondary | ICD-10-CM | POA: Diagnosis not present

## 2013-10-08 DIAGNOSIS — N871 Moderate cervical dysplasia: Secondary | ICD-10-CM | POA: Diagnosis present

## 2013-10-08 DIAGNOSIS — K922 Gastrointestinal hemorrhage, unspecified: Secondary | ICD-10-CM | POA: Diagnosis present

## 2013-10-08 DIAGNOSIS — T814XXD Infection following a procedure, subsequent encounter: Secondary | ICD-10-CM

## 2013-10-08 DIAGNOSIS — R633 Feeding difficulties, unspecified: Secondary | ICD-10-CM | POA: Diagnosis present

## 2013-10-08 DIAGNOSIS — B379 Candidiasis, unspecified: Secondary | ICD-10-CM

## 2013-10-08 DIAGNOSIS — Z885 Allergy status to narcotic agent status: Secondary | ICD-10-CM

## 2013-10-08 DIAGNOSIS — R188 Other ascites: Secondary | ICD-10-CM | POA: Diagnosis not present

## 2013-10-08 DIAGNOSIS — L259 Unspecified contact dermatitis, unspecified cause: Secondary | ICD-10-CM | POA: Diagnosis present

## 2013-10-08 DIAGNOSIS — D509 Iron deficiency anemia, unspecified: Secondary | ICD-10-CM

## 2013-10-08 DIAGNOSIS — T814XXA Infection following a procedure, initial encounter: Secondary | ICD-10-CM

## 2013-10-08 HISTORY — DX: Systemic lupus erythematosus, unspecified: M32.9

## 2013-10-08 HISTORY — DX: Reserved for concepts with insufficient information to code with codable children: IMO0002

## 2013-10-08 HISTORY — DX: Sjogren syndrome, unspecified: M35.00

## 2013-10-08 HISTORY — DX: Malignant neoplasm of stomach, unspecified: C16.9

## 2013-10-08 LAB — COMPREHENSIVE METABOLIC PANEL
ALT: 25 U/L (ref 0–35)
ANION GAP: 10 (ref 5–15)
AST: 34 U/L (ref 0–37)
Albumin: 2.9 g/dL — ABNORMAL LOW (ref 3.5–5.2)
Alkaline Phosphatase: 50 U/L (ref 39–117)
BUN: 8 mg/dL (ref 6–23)
CO2: 26 meq/L (ref 19–32)
Calcium: 9.6 mg/dL (ref 8.4–10.5)
Chloride: 102 mEq/L (ref 96–112)
Creatinine, Ser: 0.68 mg/dL (ref 0.50–1.10)
Glucose, Bld: 89 mg/dL (ref 70–99)
Potassium: 4.5 mEq/L (ref 3.7–5.3)
SODIUM: 138 meq/L (ref 137–147)
TOTAL PROTEIN: 8.3 g/dL (ref 6.0–8.3)
Total Bilirubin: 0.4 mg/dL (ref 0.3–1.2)

## 2013-10-08 LAB — CBC
HCT: 32.9 % — ABNORMAL LOW (ref 36.0–46.0)
Hemoglobin: 9.9 g/dL — ABNORMAL LOW (ref 12.0–15.0)
MCH: 21.7 pg — AB (ref 26.0–34.0)
MCHC: 30.1 g/dL (ref 30.0–36.0)
MCV: 72.1 fL — ABNORMAL LOW (ref 78.0–100.0)
PLATELETS: 386 10*3/uL (ref 150–400)
RBC: 4.56 MIL/uL (ref 3.87–5.11)
RDW: 17.8 % — ABNORMAL HIGH (ref 11.5–15.5)
WBC: 4.5 10*3/uL (ref 4.0–10.5)

## 2013-10-08 MED ORDER — HEPARIN SODIUM (PORCINE) 5000 UNIT/ML IJ SOLN
5000.0000 [IU] | Freq: Three times a day (TID) | INTRAMUSCULAR | Status: AC
  Administered 2013-10-09 (×3): 5000 [IU] via SUBCUTANEOUS
  Filled 2013-10-08 (×5): qty 1

## 2013-10-08 MED ORDER — DEXTROSE-NACL 5-0.9 % IV SOLN
INTRAVENOUS | Status: DC
  Administered 2013-10-08: via INTRAVENOUS

## 2013-10-08 MED ORDER — ACETAMINOPHEN 325 MG PO TABS: 650.0000 mg | ORAL_TABLET | Freq: Four times a day (QID) | ORAL | Status: DC | PRN

## 2013-10-08 MED ORDER — ACETAMINOPHEN 650 MG RE SUPP
650.0000 mg | Freq: Four times a day (QID) | RECTAL | Status: DC | PRN
  Administered 2013-10-12 – 2013-10-14 (×2): 650 mg via RECTAL
  Filled 2013-10-08 (×2): qty 1

## 2013-10-08 MED ORDER — ONDANSETRON HCL 4 MG/2ML IJ SOLN
4.0000 mg | Freq: Four times a day (QID) | INTRAMUSCULAR | Status: DC | PRN
  Administered 2013-10-16: 4 mg via INTRAVENOUS
  Filled 2013-10-08 (×5): qty 2

## 2013-10-08 MED ORDER — PANTOPRAZOLE SODIUM 40 MG IV SOLR
40.0000 mg | Freq: Every day | INTRAVENOUS | Status: DC
  Administered 2013-10-09 – 2013-10-26 (×19): 40 mg via INTRAVENOUS
  Filled 2013-10-08 (×22): qty 40

## 2013-10-08 MED ORDER — ONDANSETRON HCL 4 MG PO TABS: 4.0000 mg | ORAL_TABLET | Freq: Four times a day (QID) | ORAL | Status: DC | PRN

## 2013-10-08 NOTE — H&P (Signed)
[facility] History and Physical  AVRYL ROEHM YIR:[id number] DOB: 02-09-1971 DOA: 10/08/2013  Referring physician: ER physician. PCP: Conni Slipper, MD   Chief Complaint: Nausea vomiting.  HPI: Andrea Best is a 43 y.o. female with history of recent diagnosis of gastric carcinoma was found to have increasing difficulty eating with nausea vomiting and unable to keep in anything. Patient was diagnosed with gastric outlet obstruction and referred to hospital for admission and have further workup done with surgical peripheral. Patient was seen by Dr. Learta Codding and Dr. Paulita Fujita as outpatient. Plan was to admit patient and be seen by Dr. Excell Seltzer, general surgeon for patient's gastric outlet obstruction for further workup including possible surgery. On my exam patient denies any chest pain or shortness of breath. Denies any abdominal pain. Patient did have some food early yesterday and was able to keep it in. Patient also has diagnoses of Sjogren's syndrome and lupus. Patient is on Plaquenil which she was unable to take last few days.  Review of Systems: As presented in the history of presenting illness, rest negative.  Past Medical History  Diagnosis Date  . Asthma   . Eczema   . Lupus   . Sjogren's disease    Past Surgical History  Procedure Laterality Date  . Cesarean section      1997   Social History:  reports that she has never smoked. She does not have any smokeless tobacco history on file. She reports that she drinks alcohol. She reports that she does not use illicit drugs. Where does patient live home. Can patient participate in ADLs? Yes.  Allergies  Allergen Reactions  . Azithromycin Anaphylaxis  . Sulfa Antibiotics Anaphylaxis  . Hydrocodone-Acetaminophen Nausea And Vomiting    Hallucinating     Family History:  Family History  Problem Relation Age of Onset  . Hypertension Mother   . Diabetes Mother   . Diabetes Maternal Grandmother   .  Diabetes Maternal Grandfather       Prior to Admission medications   Medication Sig Start Date End Date Taking? Authorizing Provider  lansoprazole (PREVACID SOLUTAB) 15 MG disintegrating tablet Take 15 mg by mouth 2 (two) times daily before a meal.   Yes Historical Provider, MD  hydroxychloroquine (PLAQUENIL) 200 MG tablet Take 200 mg by mouth 2 (two) times daily.    Historical Provider, MD  predniSONE (DELTASONE) 5 MG tablet Take 5 mg by mouth 2 (two) times daily with a meal.    Historical Provider, MD    Physical Exam: Filed Vitals:   10/08/13 2041  BP: 100/56  Pulse: 81  Temp: 99.7 F (37.6 C)  TempSrc: Oral  Resp: 18  Height: 5\' 6"  (1.676 m)  Weight: 70.2 kg (154 lb 12.2 oz)  SpO2: 100%     General:  Moderately built and nourished.  Eyes: Anicteric mild pallor.  ENT: No discharge from ears eyes nose mouth.  Neck: No mass felt.  Cardiovascular: S1-S2 heard.  Respiratory: No rhonchi or crepitations.  Abdomen: Soft nontender bowel sounds present. No guarding rigidity.  Skin: No rash.  Musculoskeletal: No edema.  Psychiatric: Appears normal.  Neurologic: Alert awake oriented to time place and person. Moves all extremities.  Labs on Admission:  Basic Metabolic Panel:  Recent Labs Lab 10/08/13 2058  NA 138  K 4.5  CL 102  CO2 26  GLUCOSE 89  BUN 8  CREATININE 0.68  CALCIUM 9.6   Liver Function Tests:  Recent Labs Lab 10/08/13 2058  AST 34  ALT 25  ALKPHOS 50  BILITOT 0.4  PROT 8.3  ALBUMIN 2.9*   No results found for this basename: LIPASE, AMYLASE,  in the last 168 hours No results found for this basename: AMMONIA,  in the last 168 hours CBC:  Recent Labs Lab 10/08/13 2058  WBC 4.5  HGB 9.9*  HCT 32.9*  MCV 72.1*  PLT 386   Cardiac Enzymes: No results found for this basename: CKTOTAL, CKMB, CKMBINDEX, TROPONINI,  in the last 168 hours  BNP (last 3 results) No results found for this basename: PROBNP,  in the last 8760  hours CBG: No results found for this basename: GLUCAP,  in the last 168 hours  Radiological Exams on Admission: No results found.   Assessment/Plan Principal Problem:   Gastric outlet obstruction Active Problems:   Gastric cancer   Anemia   1. Gastric carcinoma with gastric outlet obstruction - I have discussed with on-call surgeon Dr. Earle Gell who will be seeing patient in consult. Dr. Wynetta Emery advised OK to keep patient on clear diet and if patient cannot tolerate and has to be made n.p.o. Patient will be on IV fluids. Further recommendations per surgery and oncologist. 2. Protein calorie malnutrition - I have consulted pharmacy for TPN. Patient may require PICC line in a.m. 3. Anemia probably related to #1 - closely follow CBC. 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 4. History of Sjogren's syndrome and lupus.    Code Status: Full code.  Family Communication: Patient's mother at the bedside.  Disposition Plan: Admit to inpatient.    Karyn Brull N. [facility] Pager [phone].  If 7PM-7AM, please contact night-coverage [email or web address] Password [facility] 10/08/2013, 11:26 PM

## 2013-10-08 NOTE — Telephone Encounter (Signed)
Message from pt, call from pt's mother to follow up on admission for today. Checked with bed control. They have pt on the schedule for today. Oncology unit is full. Admitting will contact pt when a bed is ready. Pt made aware, voiced understanding. Stated she is resting today, will await call from [facility].

## 2013-10-09 ENCOUNTER — Encounter (HOSPITAL_COMMUNITY): Payer: Self-pay | Admitting: General Surgery

## 2013-10-09 DIAGNOSIS — K3189 Other diseases of stomach and duodenum: Secondary | ICD-10-CM

## 2013-10-09 DIAGNOSIS — R112 Nausea with vomiting, unspecified: Secondary | ICD-10-CM

## 2013-10-09 DIAGNOSIS — R1013 Epigastric pain: Secondary | ICD-10-CM

## 2013-10-09 DIAGNOSIS — K311 Adult hypertrophic pyloric stenosis: Principal | ICD-10-CM

## 2013-10-09 DIAGNOSIS — C163 Malignant neoplasm of pyloric antrum: Secondary | ICD-10-CM

## 2013-10-09 DIAGNOSIS — C169 Malignant neoplasm of stomach, unspecified: Secondary | ICD-10-CM

## 2013-10-09 DIAGNOSIS — D509 Iron deficiency anemia, unspecified: Secondary | ICD-10-CM

## 2013-10-09 DIAGNOSIS — R634 Abnormal weight loss: Secondary | ICD-10-CM

## 2013-10-09 DIAGNOSIS — R599 Enlarged lymph nodes, unspecified: Secondary | ICD-10-CM

## 2013-10-09 DIAGNOSIS — M329 Systemic lupus erythematosus, unspecified: Secondary | ICD-10-CM

## 2013-10-09 DIAGNOSIS — K7689 Other specified diseases of liver: Secondary | ICD-10-CM

## 2013-10-09 DIAGNOSIS — R109 Unspecified abdominal pain: Secondary | ICD-10-CM

## 2013-10-09 DIAGNOSIS — C772 Secondary and unspecified malignant neoplasm of intra-abdominal lymph nodes: Secondary | ICD-10-CM

## 2013-10-09 DIAGNOSIS — M3500 Sicca syndrome, unspecified: Secondary | ICD-10-CM

## 2013-10-09 DIAGNOSIS — E43 Unspecified severe protein-calorie malnutrition: Secondary | ICD-10-CM

## 2013-10-09 LAB — COMPREHENSIVE METABOLIC PANEL
ALBUMIN: 2.5 g/dL — AB (ref 3.5–5.2)
ALK PHOS: 45 U/L (ref 39–117)
ALT: 22 U/L (ref 0–35)
AST: 32 U/L (ref 0–37)
Anion gap: 10 (ref 5–15)
BUN: 8 mg/dL (ref 6–23)
CALCIUM: 8.6 mg/dL (ref 8.4–10.5)
CO2: 24 mEq/L (ref 19–32)
Chloride: 105 mEq/L (ref 96–112)
Creatinine, Ser: 0.58 mg/dL (ref 0.50–1.10)
GFR calc Af Amer: >90 mL/min (ref >90)
GFR calc non Af Amer: >90 mL/min (ref >90)
Glucose, Bld: 87 mg/dL (ref 70–99)
POTASSIUM: 3.6 meq/L — AB (ref 3.7–5.3)
Sodium: 139 mEq/L (ref 137–147)
Total Bilirubin: 0.3 mg/dL (ref 0.3–1.2)
Total Protein: 7.2 g/dL (ref 6.0–8.3)

## 2013-10-09 LAB — URINALYSIS, ROUTINE W REFLEX MICROSCOPIC
Bilirubin Urine: NEGATIVE
GLUCOSE, UA: NEGATIVE mg/dL
HGB URINE DIPSTICK: NEGATIVE
Ketones, ur: NEGATIVE mg/dL
Nitrite: NEGATIVE
PH: 6.5 (ref 5.0–8.0)
PROTEIN: NEGATIVE mg/dL
SPECIFIC GRAVITY, URINE: 1.026 (ref 1.005–1.030)
Urobilinogen, UA: 0.2 mg/dL (ref 0.0–1.0)

## 2013-10-09 LAB — GLUCOSE, CAPILLARY
GLUCOSE-CAPILLARY: 101 mg/dL — AB (ref 70–99)
Glucose-Capillary: 98 mg/dL (ref 70–99)

## 2013-10-09 LAB — FERRITIN: Ferritin: 8 ng/mL — ABNORMAL LOW (ref 10–291)

## 2013-10-09 LAB — CBC
HEMATOCRIT: 30.4 % — AB (ref 36.0–46.0)
HEMOGLOBIN: 9.2 g/dL — AB (ref 12.0–15.0)
MCH: 21.8 pg — ABNORMAL LOW (ref 26.0–34.0)
MCHC: 30.3 g/dL (ref 30.0–36.0)
MCV: 72 fL — ABNORMAL LOW (ref 78.0–100.0)
Platelets: 331 10*3/uL (ref 150–400)
RBC: 4.22 MIL/uL (ref 3.87–5.11)
RDW: 17.9 % — ABNORMAL HIGH (ref 11.5–15.5)
WBC: 3.8 10*3/uL — AB (ref 4.0–10.5)

## 2013-10-09 LAB — URINE MICROSCOPIC-ADD ON

## 2013-10-09 LAB — PHOSPHORUS: Phosphorus: 4.8 mg/dL — ABNORMAL HIGH (ref 2.3–4.6)

## 2013-10-09 LAB — MAGNESIUM: MAGNESIUM: 1.9 mg/dL (ref 1.5–2.5)

## 2013-10-09 MED ORDER — CEFAZOLIN SODIUM-DEXTROSE 2-3 GM-% IV SOLR
2.0000 g | INTRAVENOUS | Status: AC
  Administered 2013-10-10: 2 g via INTRAVENOUS

## 2013-10-09 MED ORDER — INSULIN ASPART 100 UNIT/ML ~~LOC~~ SOLN
0.0000 [IU] | SUBCUTANEOUS | Status: DC
  Administered 2013-10-10: 2 [IU] via SUBCUTANEOUS
  Administered 2013-10-10: 5 [IU] via SUBCUTANEOUS
  Administered 2013-10-10 – 2013-10-13 (×6): 1 [IU] via SUBCUTANEOUS

## 2013-10-09 MED ORDER — BOOST / RESOURCE BREEZE PO LIQD
1.0000 | Freq: Two times a day (BID) | ORAL | Status: DC
  Administered 2013-10-09: 1 via ORAL

## 2013-10-09 MED ORDER — SODIUM CHLORIDE 0.9 % IJ SOLN
10.0000 mL | Freq: Two times a day (BID) | INTRAMUSCULAR | Status: DC
  Administered 2013-10-09: 40 mL
  Administered 2013-10-10 – 2013-10-27 (×6): 10 mL

## 2013-10-09 MED ORDER — TRACE MINERALS CR-CU-F-FE-I-MN-MO-SE-ZN IV SOLN
INTRAVENOUS | Status: AC
  Administered 2013-10-09: 18:00:00 via INTRAVENOUS
  Filled 2013-10-09: qty 1000

## 2013-10-09 MED ORDER — SODIUM CHLORIDE 0.9 % IJ SOLN
10.0000 mL | INTRAMUSCULAR | Status: DC | PRN
  Administered 2013-10-15 – 2013-10-27 (×9): 10 mL

## 2013-10-09 MED ORDER — DEXTROSE-NACL 5-0.9 % IV SOLN
INTRAVENOUS | Status: AC
  Administered 2013-10-09 – 2013-10-11 (×4): via INTRAVENOUS

## 2013-10-09 MED ORDER — FAT EMULSION 20 % IV EMUL
250.0000 mL | INTRAVENOUS | Status: AC
  Administered 2013-10-09: 250 mL via INTRAVENOUS
  Administered 2013-10-10: 10 mL via INTRAVENOUS
  Filled 2013-10-09: qty 250

## 2013-10-09 NOTE — Progress Notes (Signed)
IP PROGRESS NOTE  Subjective:   She was admitted last night. She continues to have bloating, belching, and intermittent vomiting.  Objective: Vital signs in last 24 hours: Blood pressure 105/59, pulse 72, temperature 98.1 F (36.7 C), temperature source Oral, resp. rate 16, height 5\' 6"  (1.676 m), weight 154 lb 12.2 oz (70.2 kg), last menstrual period 09/14/2013, SpO2 100.00%.  Intake/Output from previous day: 07/15 0701 - 07/16 0700 In: 467.5 [I.V.:467.5] Out: 225 [Urine:225]  Physical Exam:  Lungs: Lungs clear bilaterally Cardiac: Regular rate and rhythm Abdomen: Distended in the upper abdomen, no hepatosplenomegaly Extremities: No leg edema  Lab Results:  Recent Labs  10/08/13 2058 10/09/13 0427  WBC 4.5 3.8*  HGB 9.9* 9.2*  HCT 32.9* 30.4*  PLT 386 331    BMET  Recent Labs  10/08/13 2058 10/09/13 0427  NA 138 139  K 4.5 3.6*  CL 102 105  CO2 26 24  GLUCOSE 89 87  BUN 8 8  CREATININE 0.68 0.58  CALCIUM 9.6 8.6    Studies/Results: Dg Chest Port 1 View  10/08/2013   CLINICAL DATA:  Dry cough.  Evaluate for infiltrate  EXAM: PORTABLE CHEST - 1 VIEW  COMPARISON:  06/30/2007  FINDINGS: Normal heart size and mediastinal contours. No consolidation or edema. Nodular densities seen on preceding chest CT are not clearly visible. No effusion or pneumothorax. No acute osseous findings.  IMPRESSION: No active disease.   Electronically Signed   By: Jorje Guild M.D.   On: 10/08/2013 23:28    Medications: I have reviewed the patient's current medications.  Assessment/Plan: 1. Gastric cancer status post upper endoscopy 09/22/2013 with findings of a partially obstructing oozing cratered gastric ulcer in the gastric antrum.  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  CT scans chest/abdomen/pelvis 10/03/2013 showed a 7 x 5 mm groundglass opacity in the superior segment of the right lower lobe; a 4.5 mm nodular density in the right  upper lobe; bilateral axillary adenopathy; low density area anteriorly in the left hepatic lobe most consistent with fatty infiltration; another hypodense area within the medial segment of the left hepatic lobe; severe gastric distention; mild bilateral inguinal adenopathy. 2. Gastric outlet obstruction secondary to #1. 3. Nausea/vomiting secondary to #2. 4. Abdominal pain secondary to #1 and #2. 5. Weight loss secondary to #1 and #2. 6. Microcytic anemia, likely iron deficiency, at least partially due to bleeding from the gastric mass. 7. Lupus/Sjgren's.  She has gastric outlet obstruction secondary to the distal gastric tumor. She is admitted for nutrition support and surgical evaluation. We will consider adjuvant treatment options for the gastric cancer based on the surgical findings.  Recommendations:  1. PICC/TNA 2. iron replacement 3. surgical consult 4. staging PET scan if radiology will agree to this as an inpatient, if not schedule an MRI of the liver to evaluate the lesion seen on CT 5. Please call oncology as needed. I will be out 10/10/2013 through 10/12/2013. I will check on her 10/13/2013.   LOS: 1 day   Marianny Goris  10/09/2013, 8:10 AM

## 2013-10-09 NOTE — Progress Notes (Addendum)
INITIAL NUTRITION ASSESSMENT  DOCUMENTATION CODES Per approved criteria  -Severe malnutrition in the context of chronic illness  Pt meets criteria for severe MALNUTRITION in the context of chronic illness as evidenced by PO intake < 75% for > one month, 16% body weight loss in 4 months.   INTERVENTION: -Recommend Resource Breeze po BID, each supplement provides 250 kcal and 9 grams of protein -TPN per pharmacy -Diet advancement per MD -Will continue to monitor  NUTRITION DIAGNOSIS: Inadequate oral intake related to nausea/stomach pains as evidenced by PO intake <75%, 16% body weight loss in 4 months.   Goal: TPN to meet >/= 90% of their estimated nutrition needs    Monitor:  TPN tolerance, total protein/energy intake, labs, weights, GI profile, diet order  Reason for Assessment: New TPN  43 y.o. female  Admitting Dx: Gastric outlet obstruction  ASSESSMENT: She has gastric outlet obstruction secondary to the distal gastric tumor. She is admitted for nutrition support and surgical evaluation.  -Pt reported unintentional weight loss of 30 lbs since 05/2013.  -Follows vegetarian diet. Eats vegetables, pasta, eggs, dairy and cheese. Has had appetite for past 4 months; however has had to decrease intake d/t stomach pain and nausea post meals. Diet recall indicates pt tolerating liquid diet for past 2-3 weeks d/t inability to tolerate solid foods. Consumes one Boost supplement daily, has been on regimen for one week. Family prepares smoothies with protein powder for additional nutrition -Pt tolerating < 50% of clear liquid diet. Denied nausea, having small amount of stomach pains -Was willing to trial [employer] supplement, provided pt with sample, which she tolerated better with soda. Provided pt with information to continue with supplement upon d/c as tolerated -Pt to have PICC lined placed for initiation of TPN, RN noted possibly to occur later today. -K low, Phos slightly  elevated.  Height: Ht Readings from Last 1 Encounters:  10/08/13 5\' 6"  (1.676 m)    Weight: Wt Readings from Last 1 Encounters:  10/08/13 154 lb 12.2 oz (70.2 kg)    Ideal Body Weight: 130 lbs  % Ideal Body Weight: 118%  Wt Readings from Last 10 Encounters:  10/08/13 154 lb 12.2 oz (70.2 kg)  10/07/13 154 lb 4.8 oz (69.99 kg)  06/12/13 179 lb (81.194 kg)  05/26/13 184 lb (83.462 kg)  03/04/12 182 lb 8 oz (82.781 kg)  08/08/11 177 lb (80.287 kg)  07/28/11 178 lb (80.74 kg)    Usual Body Weight: 184 lbs  % Usual Body Weight: 84%  BMI:  Body mass index is 24.99 kg/(m^2).  Estimated Nutritional Needs: Kcal: 2000-2200 Protein: 85-100 gram Fluid: >/=2100 ml/daily  Skin: WDL  Diet Order: Clear Liquid  EDUCATION NEEDS: -No education needs identified at this time   Intake/Output Summary (Last 24 hours) at 10/09/13 1215 Last data filed at 10/09/13 0600  Gross per 24 hour  Intake  467.5 ml  Output    225 ml  Net  242.5 ml    Last BM: 7/17   Labs:   Recent Labs Lab 10/08/13 2058 10/09/13 0427 10/09/13 0433  NA 138 139  --   K 4.5 3.6*  --   CL 102 105  --   CO2 26 24  --   BUN 8 8  --   CREATININE 0.68 0.58  --   CALCIUM 9.6 8.6  --   MG  --   --  1.9  PHOS  --   --  4.8*  GLUCOSE 89 87  --  CBG (last 3)  No results found for this basename: GLUCAP,  in the last 72 hours  Scheduled Meds: . feeding supplement (RESOURCE BREEZE)  1 Container Oral BID BM  . heparin  5,000 Units Subcutaneous 3 times per day  . insulin aspart  0-9 Units Subcutaneous 6 times per day  . pantoprazole (PROTONIX) IV  40 mg Intravenous QHS    Continuous Infusions: . dextrose 5 % and 0.9% NaCl    . Marland KitchenTPN (CLINIMIX-E) Adult     And  . fat emulsion      Past Medical History  Diagnosis Date  . Asthma   . Eczema   . Lupus   . Sjogren's disease     Past Surgical History  Procedure Laterality Date  . Cesarean section      [place]  LDN Clinical Dietitian [phone]

## 2013-10-09 NOTE — Care Management Note (Addendum)
    Page 1 of 2   10/20/2013     3:13:07 PM CARE MANAGEMENT NOTE 10/20/2013  Patient:  Andrea Best, Andrea Best   Account Number:  [id number]  Date Initiated:  10/09/2013  Documentation initiated by:  [facility]  Subjective/Objective Assessment:   adm:  Stomach cancer with obstruction of the outflow tract of the stomach causing nausea and vomiting     Action/Plan:   Anticipated DC Date:  10/23/2013   Anticipated DC Plan:  [place]  CM consult      [facility] Choice  HOME HEALTH   Choice offered to / List presented to:  C-1 Patient        [place] arranged  HH-1 RN      [place].   Status of service:   Medicare Important Message given?  NA - LOS <3 / Initial given by admissions (If response is "NO", the following Medicare IM given date fields will be blank) Date Medicare IM given:   Medicare IM given by:   Date Additional Medicare IM given:   Additional Medicare IM given by:    Discharge Disposition:  [place]  Per UR Regulation:  Reviewed for med. necessity/level of care/duration of stay  If discussed at [place] of Stay Meetings, dates discussed:   10/16/2013    Comments:  10/18/13 Andrea Anastos RN,BSN NCM [phone] [place] REP AWARE.TPN,NPO,JP-PURULENT OUTPUT.LUQ FLUID COLLECTION.CT ABD/CHEST.IV ABX.  10/17/13 MMcGibboney, RN, BSN Pt selected [place] for HHPT. Referral given to in house rep.  10/13/2013/[name],RN,BSn,CCM: pt moving out of the icu to med surg floor/iv flds,iv tpn,goo surg.post op day 3/npo, no bowel sounds as of yet.  10/09/13 15:55 CM notes consult for med asst.  Pt has [facility] insurance and, unfortunately, is not eligible for med asst programs (for uninsured).  If a specific medication is ordered upon discharge which is new on the market, sometimes a discount card may be available.  CM will continue to follow for disposiiton needs.  [name], BSN, CM [phone].

## 2013-10-09 NOTE — Progress Notes (Addendum)
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 Azithromycin Anaphylaxis  . Sulfa Antibiotics Anaphylaxis  . Hydrocodone-Acetaminophen Nausea And Vomiting    Hallucinating   . 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 SHE DOES NOT WANT ANY BLOOD PRODUCTS OR FRACTIONS   Patient Measurements: Height: 5\' 6"  (167.6 cm) Weight: 154 lb 12.2 oz (70.2 kg) IBW/kg (Calculated) : 59.3  Vital Signs: Temp: 98.1 F (36.7 C) (07/16 0537) Temp src: Oral (07/16 0537) BP: 105/59 mmHg (07/16 0537) Pulse Rate: 72 (07/16 0537) Intake/Output from previous day: 07/15 0701 - 07/16 0700 In: 467.5 [I.V.:467.5] Out: 225 [Urine:225] Intake/Output from this shift:    Labs:  Recent Labs  10/08/13 2058 10/09/13 0427  WBC 4.5 3.8*  HGB 9.9* 9.2*  HCT 32.9* 30.4*  PLT 386 331    Recent Labs  10/08/13 2058 10/09/13 0427 10/09/13 0433  NA 138 139  --   K 4.5 3.6*  --   CL 102 105  --   CO2 26 24  --   GLUCOSE 89 87  --   BUN 8 8  --   CREATININE 0.68 0.58  --   CALCIUM 9.6 8.6  --   MG  --   --  1.9  PHOS  --   --  4.8*  PROT 8.3 7.2  --   ALBUMIN 2.9* 2.5*  --   AST 34 32  --   ALT 25 22  --   ALKPHOS 50 45  --   BILITOT 0.4 0.3  --    Estimated Creatinine Clearance: 87.5 ml/min (by C-G formula based on Cr of 0.58).   No results found for this basename: GLUCAP,  in the last 72 hours  Medical History: Past Medical History  Diagnosis Date  . Asthma   . Eczema   . Lupus   . Sjogren's disease    Medications:  Scheduled:  . heparin  5,000 Units Subcutaneous 3 times per day  . pantoprazole (PROTONIX) IV  40 mg Intravenous QHS   Infusions:  . dextrose 5 % and 0.9% NaCl 75 mL/hr at 10/08/13 2346   Insulin Requirements in the past 24 hours:  none  Current Nutrition, IV fluids:  CL diet D5NS at 75 ml/hr  Assessment:  Electrolytes: K 3.6, Phos 4.8, Ca corr 9.4;  Ca x Phos product  within range (41)  LFT's: wnl  Glucose control: wnl  Triglycerides: pending  Pre-Albumin: pending  Nutritional Goals:  RD consulted: await recommendations Clinimix 5/15 at 80 ml/hr + Lipids 20% at 10 ml/hr would deliver: 1850 kCal, 96 grams of protein per day  Plan: at 1800 today  Begin Clinimix E 5/15 at 40 ml/hr  Begin Lipids 20% at 10 ml/hr  Reduce IV fluid to 50 ml/hr  Full TNA labs in am, then Monday/Thursday  MVI and Trace elements daily  Add sensitive Novolog scale q4hr  Follow up daily.  Minda Ditto PharmD Pager [phone] 10/09/2013, 11:29 AM

## 2013-10-09 NOTE — Consult Note (Signed)
Andrea Best December 17, 1970  [id number].   Primary Care MD: Dr. Milagros Evener Requesting MD: Dr. 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 She admits to occasional mild epigastric tenderness but nothing significant as far as pain is concerned. Due to persistent nausea and vomiting, her PCP referred her to her gastroenterologist several weeks ago. Dr. Paulita Fujita endoscoped the patient and found a gastric mass. 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 She was urgently referred to oncology. She saw Dr. Benay Spice this past Tuesday. A CT scan of her chest abdomen and pelvis was obtained for further evaluation of this gastric mass. The chest CT revealed a 7 x 5 mm groundglass opacity in the superior segment of right lower lobe as well as a 5 mm nodular density seen in the right upper lobe. She also had bilateral axillary adenopathy with the largest lymph node measuring 13 x 11 mm on the right side. Her abdominal scan revealed a distended stomach with a neoplasm seen in the body and antrum of the stomach. She has a low density lesion measuring 30 x 21 mm in the anterior left hepatic lobe most consistent with fatty infiltration. However, there is a 27 x 24 mm hypodense area noted laterally to this in the medial segment of the left hepatic lobe which potentially represents metastatic disease. She is also noted to have bilateral inguinal adenopathy. After evaluation by oncology, the patient was admitted to the hospital last night for surgical intervention of her gastric outlet obstruction secondary to this gastric mass. The patient admits to about a 30 pound weight loss over the last 3-4 months due to an inability to eat  as well as now likely secondary to her malignancy. She denies any chest pain, shortness of breath, dysuria, hematemesis, or blood in her stools. She does note a very infrequent stools given her inability to eat. We have been asked to evaluate this patient for surgical recommendations.  ROS please see history of present illness, otherwise all other systems have been reviewed and are negative except for occasional swelling of her lower extremity joints likely secondary to her lupus.  Family History  Problem Relation Age of Onset  . Hypertension Mother   . Diabetes Mother   . Diabetes Maternal Grandmother   . Diabetes Maternal Grandfather     Past Medical History  Diagnosis Date  . Asthma   . Eczema   . Lupus   . Sjogren's disease     Past Surgical History  Procedure Laterality Date  . Cesarean section      1997    Social History:  reports that she has never smoked. She does not have any smokeless tobacco history on file. She reports that she drinks alcohol. She reports that she does not use illicit drugs.  Allergies:  Allergies  Allergen Reactions  . Azithromycin Rash  . Sulfa Antibiotics Rash  . Hydrocodone-Acetaminophen Nausea And Vomiting    Hallucinating   . 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 SHE DOES NOT WANT ANY BLOOD PRODUCTS OR FRACTIONS    Medications Prior to Admission  Medication Sig Dispense Refill  . lansoprazole (PREVACID SOLUTAB) 15 MG disintegrating tablet Take 15 mg by mouth 2 (two) times daily before a meal.      .  hydroxychloroquine (PLAQUENIL) 200 MG tablet Take 200 mg by mouth 2 (two) times daily.      . predniSONE (DELTASONE) 5 MG tablet Take 5 mg by mouth 2 (two) times daily with a meal.        Blood pressure 105/59, pulse 72, temperature 98.1 F (36.7 C), temperature source Oral, resp. rate 16, height $RemoveBe'5\' 6"'BmXIMQMJH$  (1.676 m), weight 154 lb 12.2 oz (70.2 kg), last menstrual period 09/14/2013, SpO2 100.00%. Physical Exam: General: pleasant, WD, WN black  female who is laying in bed in NAD HEENT: head is normocephalic, atraumatic.  Sclera are noninjected.  PERRL.  Ears and nose without any masses or lesions.  Mouth is pink and moist Heart: regular, rate, and rhythm.  Normal s1,s2. No obvious murmurs, gallops, or rubs noted.  Palpable radial and pedal pulses bilaterally Lungs: CTAB, no wheezes, rhonchi, or rales noted.  Respiratory effort nonlabored Abd: soft, mildly tender in her epigastrium, ND, +BS, no masses, hernias, or organomegaly MS: all 4 extremities are symmetrical with no cyanosis, clubbing, or edema. Skin: warm and dry with no masses, lesions, or rashes Psych: A&Ox3 with an appropriate affect.    Results for orders placed during the hospital encounter of 10/08/13 (from the past 48 hour(s))  COMPREHENSIVE METABOLIC PANEL     Status: Abnormal   Collection Time    10/08/13  8:58 PM      Result Value Ref Range   Sodium 138  137 - 147 mEq/L   Potassium 4.5  3.7 - 5.3 mEq/L   Chloride 102  96 - 112 mEq/L   CO2 26  19 - 32 mEq/L   Glucose, Bld 89  70 - 99 mg/dL   BUN 8  6 - 23 mg/dL   Creatinine, Ser 0.68  0.50 - 1.10 mg/dL   Calcium 9.6  8.4 - 10.5 mg/dL   Total Protein 8.3  6.0 - 8.3 g/dL   Albumin 2.9 (*) 3.5 - 5.2 g/dL   AST 34  0 - 37 U/L   ALT 25  0 - 35 U/L   Alkaline Phosphatase 50  39 - 117 U/L   Total Bilirubin 0.4  0.3 - 1.2 mg/dL   GFR calc non Af Amer >90  >90 mL/min   GFR calc Af Amer >90  >90 mL/min   Comment: (NOTE)     The eGFR has been calculated using the CKD EPI equation.     This calculation has not been validated in all clinical situations.     eGFR's persistently <90 mL/min signify possible Chronic Kidney     Disease.   Anion gap 10  5 - 15  CBC     Status: Abnormal   Collection Time    10/08/13  8:58 PM      Result Value Ref Range   WBC 4.5  4.0 - 10.5 K/uL   RBC 4.56  3.87 - 5.11 MIL/uL   Hemoglobin 9.9 (*) 12.0 - 15.0 g/dL   HCT 32.9 (*) 36.0 - 46.0 %   MCV 72.1 (*) 78.0 - 100.0 fL   MCH  21.7 (*) 26.0 - 34.0 pg   MCHC 30.1  30.0 - 36.0 g/dL   RDW 17.8 (*) 11.5 - 15.5 %   Platelets 386  150 - 400 K/uL  COMPREHENSIVE METABOLIC PANEL     Status: Abnormal   Collection Time    10/09/13  4:27 AM      Result Value Ref Range   Sodium 139  137 -  147 mEq/L   Potassium 3.6 (*) 3.7 - 5.3 mEq/L   Comment: DELTA CHECK NOTED     REPEATED TO VERIFY   Chloride 105  96 - 112 mEq/L   CO2 24  19 - 32 mEq/L   Glucose, Bld 87  70 - 99 mg/dL   BUN 8  6 - 23 mg/dL   Creatinine, Ser 0.58  0.50 - 1.10 mg/dL   Calcium 8.6  8.4 - 10.5 mg/dL   Total Protein 7.2  6.0 - 8.3 g/dL   Albumin 2.5 (*) 3.5 - 5.2 g/dL   AST 32  0 - 37 U/L   ALT 22  0 - 35 U/L   Alkaline Phosphatase 45  39 - 117 U/L   Total Bilirubin 0.3  0.3 - 1.2 mg/dL   GFR calc non Af Amer >90  >90 mL/min   GFR calc Af Amer >90  >90 mL/min   Comment: (NOTE)     The eGFR has been calculated using the CKD EPI equation.     This calculation has not been validated in all clinical situations.     eGFR's persistently <90 mL/min signify possible Chronic Kidney     Disease.   Anion gap 10  5 - 15  CBC     Status: Abnormal   Collection Time    10/09/13  4:27 AM      Result Value Ref Range   WBC 3.8 (*) 4.0 - 10.5 K/uL   RBC 4.22  3.87 - 5.11 MIL/uL   Hemoglobin 9.2 (*) 12.0 - 15.0 g/dL   HCT 30.4 (*) 36.0 - 46.0 %   MCV 72.0 (*) 78.0 - 100.0 fL   MCH 21.8 (*) 26.0 - 34.0 pg   MCHC 30.3  30.0 - 36.0 g/dL   RDW 17.9 (*) 11.5 - 15.5 %   Platelets 331  150 - 400 K/uL  MAGNESIUM     Status: None   Collection Time    10/09/13  4:33 AM      Result Value Ref Range   Magnesium 1.9  1.5 - 2.5 mg/dL  PHOSPHORUS     Status: Abnormal   Collection Time    10/09/13  4:33 AM      Result Value Ref Range   Phosphorus 4.8 (*) 2.3 - 4.6 mg/dL  URINALYSIS, ROUTINE W REFLEX MICROSCOPIC     Status: Abnormal   Collection Time    10/09/13  5:15 AM      Result Value Ref Range   Color, Urine YELLOW  YELLOW   APPearance CLOUDY (*) CLEAR    Specific Gravity, Urine 1.026  1.005 - 1.030   pH 6.5  5.0 - 8.0   Glucose, UA NEGATIVE  NEGATIVE mg/dL   Hgb urine dipstick NEGATIVE  NEGATIVE   Bilirubin Urine NEGATIVE  NEGATIVE   Ketones, ur NEGATIVE  NEGATIVE mg/dL   Protein, ur NEGATIVE  NEGATIVE mg/dL   Urobilinogen, UA 0.2  0.0 - 1.0 mg/dL   Nitrite NEGATIVE  NEGATIVE   Leukocytes, UA SMALL (*) NEGATIVE  URINE MICROSCOPIC-ADD ON     Status: Abnormal   Collection Time    10/09/13  5:15 AM      Result Value Ref Range   Squamous Epithelial / LPF RARE  RARE   WBC, UA 3-6  <3 WBC/hpf   RBC / HPF 3-6  <3 RBC/hpf   Bacteria, UA FEW (*) RARE   Dg Chest Port 1 View  10/08/2013   CLINICAL  DATA:  Dry cough.  Evaluate for infiltrate  EXAM: PORTABLE CHEST - 1 VIEW  COMPARISON:  06/30/2007  FINDINGS: Normal heart size and mediastinal contours. No consolidation or edema. Nodular densities seen on preceding chest CT are not clearly visible. No effusion or pneumothorax. No acute osseous findings.  IMPRESSION: No active disease.   Electronically Signed   By: Jorje Guild M.D.   On: 10/08/2013 23:28       Assessment/Plan 1. Gastric carcinoma with signet cell features, with gastric outlet obstruction 2. Several small lesions in her lung and liver, ? malignancy versus benign findings 3. Lupus 4. Sjogren's disease 5. Iron deficiency anemia, hemoglobin 9.2 (Jehovah's Witness)  Plan: 1. I've had an extensive discussion with the patient as well as her mother who is present in the room. I have talked to them about proceeding with surgical intervention to relieve her gastric outlet obstruction. We have talked about the different possibilities of surgery, including resection and anastomosis, bypass without resection, inability to do any type of resection and placement of palliative feeding tubes only.  We also talked about the possibility of trying to obtain a liver biopsy if this lesion was visible and felt appropriate by Dr. Excell Seltzer. I have  discussed with them about proceeding with surgical intervention tomorrow. They seem agreeable to this plan. We did also have several discussions about the fact that the patient is a Jehovah's Witness and blood products would not be given to the patient. 2. We'll make the patient n.p.o. after midnight in possible preparation for surgical intervention tomorrow. 3. Dr. Excell Seltzer will review her films and discuss this with the patient as well. 4. I agree with initiation of TNA as the patient has had poor nutrition for the last several months. We also agree with initiation of iron given the patient is already anemic and has plans for a major abdominal operation.   Dacie Mandel E 10/09/2013, 11:32 AM Pager: [phone]

## 2013-10-09 NOTE — Consult Note (Signed)
Patient interviewed and examined, imaging and endoscopy report personally reviewed. agree with PA note above. I had a long discussion with the patient and her cousin who was present regarding the indications and nature of the surgery and potential risks. She has a long and liver abnormality by CT scan that is indeterminate but this would not change our plan for resection. Her liver may be able to be evaluated intraoperatively. 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 We discussed and confirmed that she does not want a blood transfusion under any circumstances even to save her life. Other risks including anesthetic complications, leakage or infection were discussed. We discussed that her tumor may not be resectable in which case we would do a bypass. I discussed placement of a temporary feeding tube. All of her questions were answered and she desires to proceed.  Edward Jolly MD, FACS  10/09/2013 5:30 PM

## 2013-10-09 NOTE — Progress Notes (Signed)
Progress Note   [name] LFY:[id number] DOB: 1970/06/06 DOA: 10/08/2013 PCP: Conni Slipper, MD   Brief Narrative:   Andrea Best is an 43 y.o. 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  Assessment/Plan:   Principal Problem:   Gastric outlet obstruction secondary to gastric cancer  Evaluated by surgery with plans to proceed with gastric resection 10/10/13.  Dr. Benay Spice following for consideration of adjuvant treatment options.  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    Protein-calorie malnutrition, severe  Pt meets criteria for severe MALNUTRITION in the context of chronic illness as evidenced by PO intake < 75% for > one month, 16% body weight loss in 4 months.  Place PICC line for TNA.    Lupus/Sjogren syndrome  Plaquenil and prednisone currently on hold.    DVT Prophylaxis  On subcutaneous heparin.  Code Status: Full. Family Communication: Mother updated at the bedside. Disposition Plan: Home when stable.   IV Access:    Peripheral IV   Procedures and diagnostic studies:    Chest x-ray 10/08/13: No active disease.   Medical Consultants:    Dr. Betsy Coder, Oncology.  Dr. Excell Seltzer, Surgery.   Other Consultants:    Dietitian  Pharmacy  Anti-Infectives:    None.  Subjective:    [name] complains of some abdominal pain with liquid intake, but no vomiting since admission. She has not moved her bowels in 6 days. No shortness of breath.  Objective:    Filed Vitals:   10/08/13 2041 10/09/13 0537  BP: 100/56 105/59  Pulse: 81 72  Temp: 99.7  F (37.6 C) 98.1 F (36.7 C)  TempSrc: Oral Oral  Resp: 18 16  Height: 5\' 6"  (1.676 m)   Weight: 70.2 kg (154 lb 12.2 oz)   SpO2: 100% 100%    Intake/Output Summary (Last 24 hours) at 10/09/13 1316 Last data filed at 10/09/13 0600  Gross per 24 hour  Intake  467.5 ml  Output    225 ml  Net  242.5 ml    Exam: Gen:  NAD Cardiovascular:  RRR, No M/R/G Respiratory:  Lungs CTAB Gastrointestinal:  Abdomen slightly distended, + BS Extremities:  No C/E/C   Data Reviewed:    Labs: Basic Metabolic Panel:  Recent Labs Lab 10/08/13 2058 10/09/13 0427 10/09/13 0433  NA 138 139  --   K 4.5 3.6*  --   CL 102 105  --   CO2 26 24  --   GLUCOSE 89 87  --   BUN 8 8  --   CREATININE 0.68 0.58  --   CALCIUM 9.6 8.6  --   MG  --   --  1.9  PHOS  --   --  4.8*   GFR Estimated Creatinine Clearance: 87.5 ml/min (by C-G formula based on Cr of 0.58). Liver Function Tests:  Recent Labs Lab 10/08/13 2058 10/09/13 0427  AST 34 32  ALT 25 22  ALKPHOS 50 45  BILITOT 0.4 0.3  PROT 8.3 7.2  ALBUMIN 2.9* 2.5*   CBC:  Recent Labs Lab 10/08/13 2058 10/09/13 0427  WBC 4.5 3.8*  HGB 9.9* 9.2*  HCT 32.9* 30.4*  MCV  72.1* 72.0*  PLT 386 331   Anemia work up:  Recent Labs  10/08/13 2058  FERRITIN 8*   Sepsis Labs:  Recent Labs Lab 10/08/13 2058 10/09/13 0427  WBC 4.5 3.8*   Microbiology No results found for this or any previous visit (from the past 240 hour(s)).   Medications:   . [START ON 10/10/2013]  ceFAZolin (ANCEF) IV  2 g Intravenous On Call to OR  . feeding supplement (RESOURCE BREEZE)  1 Container Oral BID BM  . heparin  5,000 Units Subcutaneous 3 times per day  . insulin aspart  0-9 Units Subcutaneous 6 times per day  . pantoprazole (PROTONIX) IV  40 mg Intravenous QHS   Continuous Infusions: . dextrose 5 % and 0.9% NaCl    . Marland KitchenTPN (CLINIMIX-E) Adult     And  . fat emulsion      Time spent: 35 minutes with > 50% of time discussing current  diagnostic test results, clinical impression and plan of care.    LOS: 1 day   RAMA,CHRISTINA  [facility] Pager [phone]. If unable to reach me by pager, please call my cell phone at [phone].  *Please refer to [email or web address], password [email or web address] to get updated schedule on who will round on this patient, as hospitalists switch teams weekly. If 7PM-7AM, please contact night-coverage at [email or web address], password [email or web address] for any overnight needs.  10/09/2013, 1:16 PM    **Disclaimer: This note was dictated with voice recognition software. Similar sounding words can inadvertently be transcribed and this note may contain transcription errors which may not have been corrected upon publication of note.**   Information printed out and reviewed with the patient/family:     In an effort to keep you and your family informed about your hospital stay, I am providing you with this information sheet. If you or your family have any questions, please do not hesitate to have the nursing staff page me to set up a meeting time.  Also note that the hospitalist doctors typically change on Tuesdays or Wednesdays to a different hospitalist doctor.  [name] 10/09/2013 1 (Number of days in the hospital)  Treatment team:  Dr. Jacquelynn Cree, Hospitalist (Internist)  Dr. Julieanne Manson, cancer specialist  Dr. Excell Seltzer, surgery  Pertinent labs / studies:  Chest x-ray 10/08/13: No active disease.  Hemoglobin 9.2 (slightly low) with low iron.  Principle Diagnosis: Stomach cancer with obstruction of the outflow tract of the stomach causing nausea and vomiting  Plan for today: We will have a special IV placed in your arm (called a PICC line) so that we can give you concentrated nutritional fluids through your IV. The surgeons plan to perform surgery to remove the cancer in your stomach 10/10/13. Once you have recuperated from surgery, Dr. Benay Spice will talk with you about further treatment  options.  Anticipated discharge date: Depends on progress and recuperation from surgery.

## 2013-10-09 NOTE — Progress Notes (Signed)
Peripherally Inserted Central Catheter/Midline Placement  The IV Nurse has discussed with the patient and/or persons authorized to consent for the patient, the purpose of this procedure and the potential benefits and risks involved with this procedure.  The benefits include less needle sticks, lab draws from the catheter and patient may be discharged home with the catheter.  Risks include, but not limited to, infection, bleeding, blood clot (thrombus formation), and puncture of an artery; nerve damage and irregular heat beat.  Alternatives to this procedure were also discussed.  PICC/Midline Placement Documentation  PICC / Midline Double Lumen 10/09/13 PICC Right Brachial 39 cm 1 cm (Active)  Indication for Insertion or Continuance of Line Administration of hyperosmolar/irritating solutions (i.e. TPN, Vancomycin, etc.) 10/09/2013  5:11 PM  Exposed Catheter (cm) 1 cm 10/09/2013  5:11 PM  Site Assessment Clean;Dry;Intact 10/09/2013  5:11 PM  Lumen #1 Status Flushed;Saline locked;Blood return noted 10/09/2013  5:11 PM  Lumen #2 Status Flushed;Saline locked;Blood return noted 10/09/2013  5:11 PM  Dressing Type Transparent 10/09/2013  5:11 PM  Dressing Status Clean;Dry;Intact;Antimicrobial disc in place 10/09/2013  5:11 PM  Line Care Connections checked and tightened 10/09/2013  5:11 PM  Dressing Intervention New dressing 10/09/2013  5:11 PM  Dressing Change Due 10/16/13 10/09/2013  5:11 PM       Andrea Best 10/09/2013, 5:12 PM

## 2013-10-10 ENCOUNTER — Encounter (HOSPITAL_COMMUNITY): Payer: 59 | Admitting: Certified Registered Nurse Anesthetist

## 2013-10-10 ENCOUNTER — Inpatient Hospital Stay (HOSPITAL_COMMUNITY): Payer: 59 | Admitting: Certified Registered Nurse Anesthetist

## 2013-10-10 ENCOUNTER — Encounter (HOSPITAL_COMMUNITY)
Admission: AD | Disposition: A | Discharge status: Home Health | Payer: Self-pay | Source: Ambulatory Visit | Attending: Internal Medicine

## 2013-10-10 HISTORY — PX: LAPAROTOMY: SHX154

## 2013-10-10 LAB — DIFFERENTIAL
BASOS PCT: 0 % (ref 0–1)
Basophils Absolute: 0 10*3/uL (ref 0.0–0.1)
EOS PCT: 1 % (ref 0–5)
Eosinophils Absolute: 0 10*3/uL (ref 0.0–0.7)
LYMPHS PCT: 42 % (ref 12–46)
Lymphs Abs: 1.6 10*3/uL (ref 0.7–4.0)
MONOS PCT: 16 % — AB (ref 3–12)
Monocytes Absolute: 0.6 10*3/uL (ref 0.1–1.0)
NEUTROS PCT: 41 % — AB (ref 43–77)
Neutro Abs: 1.5 10*3/uL — ABNORMAL LOW (ref 1.7–7.7)

## 2013-10-10 LAB — GLUCOSE, CAPILLARY
Glucose-Capillary: 107 mg/dL — ABNORMAL HIGH (ref 70–99)
Glucose-Capillary: 195 mg/dL — ABNORMAL HIGH (ref 70–99)
Glucose-Capillary: 255 mg/dL — ABNORMAL HIGH (ref 70–99)
Glucose-Capillary: 188 mg/dL — ABNORMAL HIGH (ref 70–99)

## 2013-10-10 LAB — COMPREHENSIVE METABOLIC PANEL
ALK PHOS: 43 U/L (ref 39–117)
ALT: 19 U/L (ref 0–35)
AST: 27 U/L (ref 0–37)
Albumin: 2.3 g/dL — ABNORMAL LOW (ref 3.5–5.2)
Anion gap: 9 (ref 5–15)
BUN: 4 mg/dL — ABNORMAL LOW (ref 6–23)
CALCIUM: 8.6 mg/dL (ref 8.4–10.5)
CO2: 25 meq/L (ref 19–32)
Chloride: 106 mEq/L (ref 96–112)
Creatinine, Ser: 0.55 mg/dL (ref 0.50–1.10)
GFR calc Af Amer: >90 mL/min (ref >90)
GFR calc non Af Amer: >90 mL/min (ref >90)
Glucose, Bld: 90 mg/dL (ref 70–99)
Potassium: 3.9 mEq/L (ref 3.7–5.3)
Sodium: 140 mEq/L (ref 137–147)
TOTAL PROTEIN: 6.8 g/dL (ref 6.0–8.3)
Total Bilirubin: 0.2 mg/dL — ABNORMAL LOW (ref 0.3–1.2)

## 2013-10-10 LAB — MRSA PCR SCREENING: MRSA by PCR: NEGATIVE

## 2013-10-10 LAB — PHOSPHORUS: PHOSPHORUS: 5.1 mg/dL — AB (ref 2.3–4.6)

## 2013-10-10 LAB — PREALBUMIN: Prealbumin: 7.9 mg/dL — ABNORMAL LOW (ref 17.0–34.0)

## 2013-10-10 LAB — CBC
HCT: 30 % — ABNORMAL LOW (ref 36.0–46.0)
HEMOGLOBIN: 9 g/dL — AB (ref 12.0–15.0)
MCH: 21.9 pg — ABNORMAL LOW (ref 26.0–34.0)
MCHC: 30 g/dL (ref 30.0–36.0)
MCV: 73 fL — ABNORMAL LOW (ref 78.0–100.0)
Platelets: 338 10*3/uL (ref 150–400)
RBC: 4.11 MIL/uL (ref 3.87–5.11)
RDW: 17.9 % — ABNORMAL HIGH (ref 11.5–15.5)
WBC: 3.7 10*3/uL — ABNORMAL LOW (ref 4.0–10.5)

## 2013-10-10 LAB — HEMOGLOBIN AND HEMATOCRIT, BLOOD
HCT: 31.2 % — ABNORMAL LOW (ref 36.0–46.0)
Hemoglobin: 9.4 g/dL — ABNORMAL LOW (ref 12.0–15.0)

## 2013-10-10 LAB — MAGNESIUM: MAGNESIUM: 1.8 mg/dL (ref 1.5–2.5)

## 2013-10-10 LAB — TRIGLYCERIDES: TRIGLYCERIDES: 94 mg/dL (ref <150)

## 2013-10-10 SURGERY — LAPAROTOMY, EXPLORATORY
Anesthesia: General | Site: Abdomen

## 2013-10-10 MED ORDER — MIDAZOLAM HCL 5 MG/5ML IJ SOLN
INTRAMUSCULAR | Status: DC | PRN
  Administered 2013-10-10: 2 mg via INTRAVENOUS

## 2013-10-10 MED ORDER — GLYCOPYRROLATE 0.2 MG/ML IJ SOLN
INTRAMUSCULAR | Status: AC
  Filled 2013-10-10: qty 2

## 2013-10-10 MED ORDER — LIDOCAINE HCL (CARDIAC) 20 MG/ML IV SOLN
INTRAVENOUS | Status: AC
  Filled 2013-10-10: qty 5

## 2013-10-10 MED ORDER — HYDROMORPHONE HCL PF 1 MG/ML IJ SOLN
1.0000 mg | INTRAMUSCULAR | Status: DC | PRN
  Administered 2013-10-10: 1 mg via INTRAVENOUS
  Administered 2013-10-10 (×2): 2 mg via INTRAVENOUS
  Administered 2013-10-10: 1 mg via INTRAVENOUS
  Administered 2013-10-11: 2 mg via INTRAVENOUS
  Administered 2013-10-11: 1 mg via INTRAVENOUS
  Administered 2013-10-11: 2 mg via INTRAVENOUS
  Administered 2013-10-11: 1 mg via INTRAVENOUS
  Filled 2013-10-10 (×5): qty 2
  Filled 2013-10-10 (×2): qty 1

## 2013-10-10 MED ORDER — HYDROMORPHONE HCL PF 1 MG/ML IJ SOLN
0.2500 mg | INTRAMUSCULAR | Status: DC | PRN
  Administered 2013-10-10: 0.5 mg via INTRAVENOUS

## 2013-10-10 MED ORDER — SODIUM CHLORIDE 0.9 % IV SOLN
25.0000 mg | Freq: Once | INTRAVENOUS | Status: AC
  Administered 2013-10-11: 25 mg via INTRAVENOUS
  Filled 2013-10-10: qty 0.5

## 2013-10-10 MED ORDER — PROMETHAZINE HCL 25 MG/ML IJ SOLN: 6.2500 mg | INTRAMUSCULAR | Status: DC | PRN

## 2013-10-10 MED ORDER — FENTANYL CITRATE 0.05 MG/ML IJ SOLN
INTRAMUSCULAR | Status: AC
  Filled 2013-10-10: qty 5

## 2013-10-10 MED ORDER — CEFAZOLIN SODIUM-DEXTROSE 2-3 GM-% IV SOLR
INTRAVENOUS | Status: AC
  Filled 2013-10-10: qty 50

## 2013-10-10 MED ORDER — FAT EMULSION 20 % IV EMUL
250.0000 mL | INTRAVENOUS | Status: AC
  Administered 2013-10-10: 250 mL via INTRAVENOUS
  Filled 2013-10-10: qty 250

## 2013-10-10 MED ORDER — HYDROMORPHONE HCL PF 2 MG/ML IJ SOLN
INTRAMUSCULAR | Status: AC
  Filled 2013-10-10: qty 1

## 2013-10-10 MED ORDER — PROPOFOL 10 MG/ML IV BOLUS
INTRAVENOUS | Status: DC | PRN
  Administered 2013-10-10: 160 mg via INTRAVENOUS

## 2013-10-10 MED ORDER — CEFAZOLIN SODIUM-DEXTROSE 2-3 GM-% IV SOLR
2.0000 g | Freq: Once | INTRAVENOUS | Status: AC
  Administered 2013-10-10: 2 g via INTRAVENOUS

## 2013-10-10 MED ORDER — HYDROMORPHONE HCL PF 1 MG/ML IJ SOLN
INTRAMUSCULAR | Status: AC
  Administered 2013-10-10: 1 mg via INTRAVENOUS
  Filled 2013-10-10: qty 1

## 2013-10-10 MED ORDER — ONDANSETRON HCL 4 MG/2ML IJ SOLN
4.0000 mg | Freq: Four times a day (QID) | INTRAMUSCULAR | Status: DC
  Administered 2013-10-10 – 2013-10-27 (×66): 4 mg via INTRAVENOUS
  Filled 2013-10-10 (×62): qty 2

## 2013-10-10 MED ORDER — TISSEEL VH 10 ML EX KIT
PACK | CUTANEOUS | Status: AC
  Filled 2013-10-10: qty 1

## 2013-10-10 MED ORDER — HYDROMORPHONE HCL PF 1 MG/ML IJ SOLN
0.2500 mg | INTRAMUSCULAR | Status: DC | PRN
  Administered 2013-10-10: 1 mg via INTRAVENOUS

## 2013-10-10 MED ORDER — 0.9 % SODIUM CHLORIDE (POUR BTL) OPTIME
TOPICAL | Status: DC | PRN
  Administered 2013-10-10: 6000 mL

## 2013-10-10 MED ORDER — MIDAZOLAM HCL 2 MG/2ML IJ SOLN
INTRAMUSCULAR | Status: AC
  Filled 2013-10-10: qty 2

## 2013-10-10 MED ORDER — STERILE WATER FOR IRRIGATION IR SOLN
Status: DC | PRN
  Administered 2013-10-10: 1500 mL

## 2013-10-10 MED ORDER — NEOSTIGMINE METHYLSULFATE 10 MG/10ML IV SOLN
INTRAVENOUS | Status: DC | PRN
  Administered 2013-10-10: 3 mg via INTRAVENOUS

## 2013-10-10 MED ORDER — ROCURONIUM BROMIDE 100 MG/10ML IV SOLN
INTRAVENOUS | Status: AC
Start: 2013-10-10 — End: 2013-10-10
  Filled 2013-10-10: qty 3

## 2013-10-10 MED ORDER — LACTATED RINGERS IV SOLN
INTRAVENOUS | Status: DC | PRN
  Administered 2013-10-10 (×3): via INTRAVENOUS

## 2013-10-10 MED ORDER — ROCURONIUM BROMIDE 100 MG/10ML IV SOLN
INTRAVENOUS | Status: AC
  Filled 2013-10-10: qty 1

## 2013-10-10 MED ORDER — SODIUM CHLORIDE 0.9 % IV SOLN
1000.0000 mg | Freq: Once | INTRAVENOUS | Status: AC
  Administered 2013-10-11: 1000 mg via INTRAVENOUS
  Filled 2013-10-10: qty 20

## 2013-10-10 MED ORDER — DEXAMETHASONE SODIUM PHOSPHATE 10 MG/ML IJ SOLN
INTRAMUSCULAR | Status: DC | PRN
  Administered 2013-10-10: 10 mg via INTRAVENOUS

## 2013-10-10 MED ORDER — HYDROMORPHONE HCL PF 1 MG/ML IJ SOLN
INTRAMUSCULAR | Status: AC
  Filled 2013-10-10: qty 1

## 2013-10-10 MED ORDER — GLYCOPYRROLATE 0.2 MG/ML IJ SOLN
INTRAMUSCULAR | Status: DC | PRN
  Administered 2013-10-10: 0.4 mg via INTRAVENOUS

## 2013-10-10 MED ORDER — FENTANYL CITRATE 0.05 MG/ML IJ SOLN
INTRAMUSCULAR | Status: DC | PRN
  Administered 2013-10-10: 50 ug via INTRAVENOUS
  Administered 2013-10-10: 100 ug via INTRAVENOUS
  Administered 2013-10-10: 50 ug via INTRAVENOUS
  Administered 2013-10-10 (×2): 100 ug via INTRAVENOUS
  Administered 2013-10-10: 50 ug via INTRAVENOUS
  Administered 2013-10-10: 150 ug via INTRAVENOUS
  Administered 2013-10-10: 50 ug via INTRAVENOUS

## 2013-10-10 MED ORDER — PROPOFOL 10 MG/ML IV BOLUS
INTRAVENOUS | Status: AC
  Filled 2013-10-10: qty 20

## 2013-10-10 MED ORDER — ONDANSETRON HCL 4 MG/2ML IJ SOLN
INTRAMUSCULAR | Status: DC | PRN
  Administered 2013-10-10: 4 mg via INTRAVENOUS

## 2013-10-10 MED ORDER — ONDANSETRON HCL 4 MG/2ML IJ SOLN
INTRAMUSCULAR | Status: AC
  Filled 2013-10-10: qty 2

## 2013-10-10 MED ORDER — HYDROMORPHONE HCL PF 1 MG/ML IJ SOLN
INTRAMUSCULAR | Status: DC | PRN
  Administered 2013-10-10 (×2): 1 mg via INTRAVENOUS

## 2013-10-10 MED ORDER — SUCCINYLCHOLINE CHLORIDE 20 MG/ML IJ SOLN
INTRAMUSCULAR | Status: DC | PRN
  Administered 2013-10-10: 100 mg via INTRAVENOUS

## 2013-10-10 MED ORDER — DEXAMETHASONE SODIUM PHOSPHATE 10 MG/ML IJ SOLN
INTRAMUSCULAR | Status: AC
  Filled 2013-10-10: qty 1

## 2013-10-10 MED ORDER — TRACE MINERALS CR-CU-F-FE-I-MN-MO-SE-ZN IV SOLN
INTRAVENOUS | Status: AC
  Administered 2013-10-10: 17:00:00 via INTRAVENOUS
  Filled 2013-10-10: qty 1000

## 2013-10-10 MED ORDER — ROCURONIUM BROMIDE 100 MG/10ML IV SOLN
INTRAVENOUS | Status: DC | PRN
  Administered 2013-10-10 (×4): 10 mg via INTRAVENOUS
  Administered 2013-10-10: 20 mg via INTRAVENOUS
  Administered 2013-10-10: 10 mg via INTRAVENOUS
  Administered 2013-10-10: 40 mg via INTRAVENOUS

## 2013-10-10 MED ORDER — LIDOCAINE HCL (CARDIAC) 20 MG/ML IV SOLN
INTRAVENOUS | Status: DC | PRN
  Administered 2013-10-10: 80 mg via INTRAVENOUS

## 2013-10-10 SURGICAL SUPPLY — 70 items
APPLICATOR COTTON TIP 6IN STRL (MISCELLANEOUS) IMPLANT
ATTRACTOMAT 16X20 MAGNETIC DRP (DRAPES) IMPLANT
BLADE EXTENDED COATED 6.5IN (ELECTRODE) ×4 IMPLANT
BLADE HEX COATED 2.75 (ELECTRODE) ×4 IMPLANT
BLADE SURG SZ10 CARB STEEL (BLADE) ×8 IMPLANT
CANISTER SUCTION 2500CC (MISCELLANEOUS) ×4 IMPLANT
CLIP TI LARGE 6 (CLIP) ×16 IMPLANT
CLIP TI MEDIUM 6 (CLIP) ×16 IMPLANT
COUNTER NEEDLE 20 DBL MAG RED (NEEDLE) ×4 IMPLANT
COVER MAYO STAND STRL (DRAPES) ×4 IMPLANT
DRAIN CHANNEL 19F RND (DRAIN) ×4 IMPLANT
DRAIN CHANNEL RND F F (WOUND CARE) IMPLANT
DRAIN PENROSE 1X36 LTX NONSTRL (WOUND CARE) IMPLANT
DRAPE INCISE IOBAN 66X45 STRL (DRAPES) ×4 IMPLANT
DRAPE LAPAROSCOPIC ABDOMINAL (DRAPES) ×4 IMPLANT
DRAPE WARM FLUID 44X44 (DRAPE) ×4 IMPLANT
DRSG OPSITE POSTOP 4X12 (GAUZE/BANDAGES/DRESSINGS) ×4 IMPLANT
ELECT REM PT RETURN 9FT ADLT (ELECTROSURGICAL) ×4
ELECTRODE REM PT RTRN 9FT ADLT (ELECTROSURGICAL) ×2 IMPLANT
EVACUATOR SILICONE 100CC (DRAIN) ×4 IMPLANT
GAUZE SPONGE 4X4 12PLY STRL (GAUZE/BANDAGES/DRESSINGS) ×4 IMPLANT
GAUZE SPONGE 4X4 16PLY XRAY LF (GAUZE/BANDAGES/DRESSINGS) IMPLANT
GLOVE BIOGEL PI IND STRL 7.0 (GLOVE) ×4 IMPLANT
GLOVE BIOGEL PI IND STRL 7.5 (GLOVE) ×2 IMPLANT
GLOVE BIOGEL PI INDICATOR 7.0 (GLOVE) ×4
GLOVE BIOGEL PI INDICATOR 7.5 (GLOVE) ×2
GLOVE ECLIPSE 6.5 STRL STRAW (GLOVE) ×12 IMPLANT
GLOVE SURG SS PI 6.5 STRL IVOR (GLOVE) ×12 IMPLANT
GLOVE SURG SS PI 7.5 STRL IVOR (GLOVE) ×24 IMPLANT
GOWN STRL REUS W/TWL LRG LVL3 (GOWN DISPOSABLE) ×4 IMPLANT
GOWN STRL REUS W/TWL XL LVL3 (GOWN DISPOSABLE) ×8 IMPLANT
KIT BASIN OR (CUSTOM PROCEDURE TRAY) ×4 IMPLANT
LIGASURE IMPACT 36 18CM CVD LR (INSTRUMENTS) ×4 IMPLANT
MARKER SKIN DUAL TIP RULER LAB (MISCELLANEOUS) IMPLANT
NS IRRIG 1000ML POUR BTL (IV SOLUTION) ×8 IMPLANT
PACK GENERAL/GYN (CUSTOM PROCEDURE TRAY) ×4 IMPLANT
PLUG CATH AND CAP STER (CATHETERS) IMPLANT
RELOAD AUTO 90-4.8 TA90 GRN (ENDOMECHANICALS) ×12 IMPLANT
RELOAD PROXIMATE 75MM BLUE (ENDOMECHANICALS) ×4 IMPLANT
RELOAD PROXIMATE TA60MM BLUE (ENDOMECHANICALS) ×4 IMPLANT
SHEARS HARMONIC ACE PLUS 36CM (ENDOMECHANICALS) IMPLANT
SPONGE DRAIN TRACH 4X4 STRL 2S (GAUZE/BANDAGES/DRESSINGS) ×4 IMPLANT
SPONGE LAP 18X18 X RAY DECT (DISPOSABLE) ×8 IMPLANT
SPONGE LAP 4X18 X RAY DECT (DISPOSABLE) IMPLANT
STAPLER 90 3.5 STAND SLIM (STAPLE) ×4
STAPLER 90 3.5 STD SLIM (STAPLE) ×2 IMPLANT
STAPLER GUN LINEAR PROX 60 (STAPLE) ×4 IMPLANT
STAPLER PROXIMATE 75MM BLUE (STAPLE) ×4 IMPLANT
STAPLER VISISTAT 35W (STAPLE) ×4 IMPLANT
SUCTION POOLE TIP (SUCTIONS) ×12 IMPLANT
SUT ETHILON 2 0 PS N (SUTURE) ×4 IMPLANT
SUT NOVA NAB GS-21 0 18 T12 DT (SUTURE) IMPLANT
SUT PDS AB 1 CTX 36 (SUTURE) IMPLANT
SUT PDS AB 1 TP1 96 (SUTURE) ×8 IMPLANT
SUT SILK 2 0 (SUTURE) ×6
SUT SILK 2 0 SH CR/8 (SUTURE) ×12 IMPLANT
SUT SILK 2-0 18XBRD TIE 12 (SUTURE) ×6 IMPLANT
SUT SILK 3 0 (SUTURE) ×2
SUT SILK 3 0 SH CR/8 (SUTURE) ×8 IMPLANT
SUT SILK 3-0 18XBRD TIE 12 (SUTURE) ×2 IMPLANT
SUT VIC AB 2-0 SH 27 (SUTURE) ×12
SUT VIC AB 2-0 SH 27X BRD (SUTURE) ×12 IMPLANT
SUT VIC AB 3-0 SH 18 (SUTURE) ×4 IMPLANT
SUT VIC AB 3-0 SH 27 (SUTURE) ×6
SUT VIC AB 3-0 SH 27X BRD (SUTURE) ×2 IMPLANT
SUT VIC AB 3-0 SH 27XBRD (SUTURE) ×4 IMPLANT
SYRINGE IRR TOOMEY STRL 70CC (SYRINGE) ×4 IMPLANT
TOWEL OR 17X26 10 PK STRL BLUE (TOWEL DISPOSABLE) ×8 IMPLANT
TRAY FOLEY CATH 14FRSI W/METER (CATHETERS) ×4 IMPLANT
YANKAUER SUCT BULB TIP NO VENT (SUCTIONS) ×4 IMPLANT

## 2013-10-10 NOTE — Anesthesia Postprocedure Evaluation (Signed)
  Anesthesia Post-op Note  Patient: Andrea Best  Procedure(s) Performed: Procedure(s) (LRB): SUBTOTAL GASTRECTOMY WITH GASTRIC JEJUNOSOTMY (N/A)  Patient Location: PACU  Anesthesia Type: General  Level of Consciousness: awake and alert   Airway and Oxygen Therapy: Patient Spontanous Breathing  Post-op Pain: mild  Post-op Assessment: Post-op Vital signs reviewed, Patient's Cardiovascular Status Stable, Respiratory Function Stable, Patent Airway and No signs of Nausea or vomiting  Last Vitals:  Filed Vitals:   10/10/13 1330  BP: 106/72  Pulse: 85  Temp:   Resp: 11    Post-op Vital Signs: stable   Complications: No apparent anesthesia complications

## 2013-10-10 NOTE — Progress Notes (Signed)
CSW received referral that pt had Healthcare Power of [place], but wanted to complete living will.  CSW received notification that pt in surgery today.  CSW to follow up at a later time to assist with Living Will and complete full psychosocial assessment at that time.  CSW to continue to follow.  Alison Murray, MSW, [place] [facility] [phone]

## 2013-10-10 NOTE — Progress Notes (Signed)
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 Azithromycin Rash  . Sulfa Antibiotics Rash  . Hydrocodone-Acetaminophen Nausea And Vomiting    Hallucinating   . 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 SHE DOES NOT WANT ANY BLOOD PRODUCTS OR FRACTIONS   Patient Measurements: Height: 5\' 6"  (167.6 cm) Weight: 154 lb 12.2 oz (70.2 kg) IBW/kg (Calculated) : 59.3  Vital Signs: Temp: 98.5 F (36.9 C) (07/17 0407) Temp src: Oral (07/17 0407) BP: 102/59 mmHg (07/17 0407) Pulse Rate: 74 (07/17 0407) Intake/Output from previous day: 07/16 0701 - 07/17 0700 In: 480 [P.O.:480] Out: 600 [Urine:600] Intake/Output from this shift: Total I/O In: 1000 [I.V.:1000] Out: -   Labs:  Recent Labs  10/08/13 2058 10/09/13 0427 10/10/13 0550  WBC 4.5 3.8* 3.7*  HGB 9.9* 9.2* 9.0*  HCT 32.9* 30.4* 30.0*  PLT 386 331 338    Recent Labs  10/08/13 2058 10/09/13 0427 10/09/13 0433 10/10/13 0550  NA 138 139  --  140  K 4.5 3.6*  --  3.9  CL 102 105  --  106  CO2 26 24  --  25  GLUCOSE 89 87  --  90  BUN 8 8  --  4*  CREATININE 0.68 0.58  --  0.55  CALCIUM 9.6 8.6  --  8.6  MG  --   --  1.9 1.8  PHOS  --   --  4.8* 5.1*  PROT 8.3 7.2  --  6.8  ALBUMIN 2.9* 2.5*  --  2.3*  AST 34 32  --  27  ALT 25 22  --  19  ALKPHOS 50 45  --  43  BILITOT 0.4 0.3  --  0.2*  TRIG  --   --   --  94   Estimated Creatinine Clearance: 87.5 ml/min (by C-G formula based on Cr of 0.55).    Recent Labs  10/09/13 2024 10/09/13 2355 10/10/13 0403  GLUCAP 98 101* 107*   Medications:  Scheduled:  . [MAR HOLD] feeding supplement (RESOURCE BREEZE)  1 Container Oral BID BM  . [MAR HOLD] insulin aspart  0-9 Units Subcutaneous 6 times per day  . [MAR HOLD] pantoprazole (PROTONIX) IV  40 mg Intravenous QHS  . [facility] HOLD] sodium chloride  10-40 mL Intracatheter Q12H   Infusions:  . dextrose 5 % and 0.9% NaCl 75  mL/hr at 10/09/13 1235  . [MAR HOLD] .TPN (CLINIMIX-E) Adult 40 mL/hr (10/10/13 0713)   And  . [MAR HOLD] fat emulsion 250 mL (10/09/13 1802)   Insulin Requirements in the past 24 hours:  None, CBG 101/107 Novolog sensitive scale q4hr  Current Nutrition, IV fluids:  CL diet D5NS at 50 ml/hr  Access: PICC placed 7/16 Day # 2 TNA  Assessment:  Electrolytes: Phos 4.8-> 5.1, Ca corr 9.7;  Ca x Phos product within range (49)  LFT's: wnl  Glucose control: wnl  Triglycerides: 94  Pre-Albumin: pending  Nutritional Goals:  RD consulted: Kcal: 2000-2200  Protein: 85-100 gram  Fluid: >/=2100 ml/daily  Clinimix 5/15 at 80 ml/hr + Lipids 20% at 10 ml/hr would deliver: 1850 kCal per day 96 grams of protein per day  Plan: at 1800 today  Continue Clinimix E 5/15 at 40 ml/hr  Continue Lipids 20% at 10 ml/hr  IV fluid per MD post-op  Full TNA labs Monday/Thursday  MVI and Trace elements  daily  Continue sensitive Novolog scale q4hr, reduce to q6hr once stable with TNA closer to goal range  Follow up daily: CMET, Mag, Phos levels in am  Minda Ditto PharmD Pager [phone] 10/10/2013, 9:58 AM

## 2013-10-10 NOTE — Anesthesia Preprocedure Evaluation (Signed)
Anesthesia Evaluation  Patient identified by MRN, date of birth, ID band Patient awake  General Assessment Comment:gastric cancer  Reviewed: Allergy & Precautions, H&P , NPO status , Patient's Chart, lab work & pertinent test results  Airway Mallampati: II TM Distance: >3 FB Neck ROM: Full    Dental no notable dental hx.    Pulmonary neg pulmonary ROS,  breath sounds clear to auscultation  Pulmonary exam normal       Cardiovascular negative cardio ROS  Rhythm:Regular Rate:Normal     Neuro/Psych negative neurological ROS  negative psych ROS   GI/Hepatic negative GI ROS, Neg liver ROS,   Endo/Other  negative endocrine ROS  Renal/GU negative Renal ROS  negative genitourinary   Musculoskeletal negative musculoskeletal ROS (+)   Abdominal   Peds negative pediatric ROS (+)  Hematology  (+) anemia , Lupus    Anesthesia Other Findings   Reproductive/Obstetrics negative OB ROS                           Anesthesia Physical Anesthesia Plan  ASA: III  Anesthesia Plan: General   Post-op Pain Management:    Induction: Intravenous, Rapid sequence and Cricoid pressure planned  Airway Management Planned: Oral ETT  Additional Equipment:   Intra-op Plan:   Post-operative Plan: Extubation in OR  Informed Consent: I have reviewed the patients History and Physical, chart, labs and discussed the procedure including the risks, benefits and alternatives for the proposed anesthesia with the patient or authorized representative who has indicated his/her understanding and acceptance.   Dental advisory given  Plan Discussed with: CRNA and Surgeon  Anesthesia Plan Comments:         Anesthesia Quick Evaluation

## 2013-10-10 NOTE — Progress Notes (Signed)
Progress Note   Andrea Best:[id number] DOB: 06-21-1970 DOA: 10/08/2013 PCP: Conni Slipper, MD   Brief Narrative:   Andrea Best is an 43 y.o. 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 She underwent subtotal gastrectomy with porta hepatis and peripancreatic lymph node dissection with a Roux-en-Y gastrojejunostomy on 10/10/13.  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  In SDU post-op.  Dr. Benay Spice following for consideration of adjuvant treatment options.  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  Iron therapy ordered per pharmacy dosing.    Protein-calorie malnutrition, severe  Pt meets criteria for severe MALNUTRITION in the context of chronic illness as evidenced by PO intake < 75% for > one month, 16% body weight loss in 4 months.  PICC line placed for TNA administration.    Lupus/Sjogren syndrome  Plaquenil and prednisone currently on hold.    DVT Prophylaxis  On subcutaneous heparin.  Code Status: Full. Family Communication: Mother updated at the bedside 10/09/13, no family currently at the bedside. Disposition Plan: Home when stable.   IV Access:    Peripheral IV   Procedures and diagnostic studies:    Chest x-ray 10/08/13: No active disease.  Subtotal gastrectomy with porta hepatis and peripancreatic lymph node dissection, Roux-en-Y gastrojejunostomy 10/10/13 by Dr. Excell Seltzer.   Medical Consultants:    Dr. Betsy Coder, Oncology.  Dr. Excell Seltzer, Surgery.   Other Consultants:    Dietitian  Pharmacy  Anti-Infectives:    None.  Subjective:    [name] is sedated postoperatively but opens rise to voice. She has some postoperative discomfort but no nausea or vomiting. NG tube in place draining bilious material.  Objective:    Filed Vitals:   10/10/13 1415 10/10/13 1430 10/10/13 1455 10/10/13 1500  BP: 125/71 122/73 127/73 133/77  Pulse: 92 96 100 102  Temp: 97 F (36.1 C) 97 F (36.1 C) 97.7 F (36.5 C)   TempSrc:      Resp: 10 12 13 11   Height:      Weight:      SpO2: 100% 100% 100% 100%    Intake/Output Summary (Last 24 hours) at 10/10/13 1603 Last data filed at 10/10/13 1430  Gross per 24 hour  Intake   3850 ml  Output   2800 ml  Net   1050 ml    Exam: Gen:  NAD, sedated postop Cardiovascular:  Tachycardic, No M/R/G Respiratory:  Lungs diminished Gastrointestinal:  Abdomen soft with dressings intact. Suction drain RUQ. Extremities:  No C/E/C   Data Reviewed:    Labs: Basic Metabolic Panel:  Recent Labs Lab 10/08/13 2058 10/09/13 0427 10/09/13 0433 10/10/13 0550  NA 138 139  --  140  K 4.5 3.6*  --  3.9  CL 102 105  --  106  CO2 26 24  --  25  GLUCOSE 89 87  --  90  BUN 8 8  --  4*  CREATININE 0.68 0.58  --  0.55  CALCIUM 9.6 8.6  --  8.6  MG  --   --  1.9 1.8  PHOS  --   --  4.8* 5.1*   GFR Estimated Creatinine Clearance: 87.5 ml/min (by C-G formula based on Cr of 0.55). Liver Function Tests:  Recent Labs Lab 10/08/13 2058 10/09/13 0427 10/10/13 0550  AST 34 32 27  ALT 25 22 19   ALKPHOS 50 45 43  BILITOT 0.4 0.3 0.2*  PROT 8.3 7.2 6.8  ALBUMIN 2.9* 2.5* 2.3*   CBC:  Recent Labs Lab 10/08/13 2058 10/09/13 0427 10/10/13 0550  WBC 4.5 3.8* 3.7*  NEUTROABS  --   --  1.5*  HGB 9.9* 9.2* 9.0*  HCT 32.9* 30.4* 30.0*  MCV 72.1* 72.0* 73.0*  PLT 386 331 338    Anemia work up:  Recent Labs  10/08/13 2058  FERRITIN 8*   Sepsis Labs:  Recent Labs Lab 10/08/13 2058 10/09/13 0427 10/10/13 0550  WBC 4.5 3.8* 3.7*   Microbiology No results found for this or any previous visit (from the past 240 hour(s)).   Medications:   . HYDROmorphone      . insulin aspart  0-9 Units Subcutaneous 6 times per day  . [START ON 10/11/2013] iron dextran (INFED/DEXFERRUM) infusion  25 mg Intravenous Once   Followed by  . [START ON 10/11/2013] iron dextran (INFED/DEXFERRUM) infusion  1,000 mg Intravenous Once  . ondansetron (ZOFRAN) IV  4 mg Intravenous 4 times per day  . pantoprazole (PROTONIX) IV  40 mg Intravenous QHS  . sodium chloride  10-40 mL Intracatheter Q12H   Continuous Infusions: . dextrose 5 % and 0.9% NaCl 75 mL/hr at 10/10/13 1541  . Marland KitchenTPN (CLINIMIX-E) Adult 40 mL/hr (10/10/13 0713)   And  . fat emulsion 250 mL (10/09/13 1802)  . Marland KitchenTPN (CLINIMIX-E) Adult     And  . fat emulsion      Time spent: 25 minutes.    LOS: 2 days   Kemberly Taves  [facility] Pager [phone]. If unable to reach me by pager, please call my cell phone at [phone].  *Please refer to [email or web address], password [email or web address] to get updated schedule on who will round on this patient, as hospitalists switch teams weekly. If 7PM-7AM, please contact night-coverage at [email or web address], password [email or web address] for any overnight needs.  10/10/2013, 4:03 PM    **Disclaimer: This note was dictated with voice recognition software. Similar sounding words can inadvertently be transcribed and this note may contain transcription errors which may not have been corrected upon publication of note.**   Information printed out and reviewed with the patient/family:     In an effort to keep you and your family informed about your hospital stay, I am providing you with this information sheet. If you or your family have any questions, please do not hesitate to have the nursing staff page me to set  up a meeting time.  Also note that the hospitalist doctors typically change on Tuesdays or Wednesdays to a different hospitalist doctor.  AKACIA BOLTZ 10/10/2013 2 (Number of days in the hospital)  Treatment team:  Dr. Jacquelynn Cree, Hospitalist (Internist)  Dr. Julieanne Manson, cancer specialist  Dr. Excell Seltzer, surgery  Pertinent labs / studies:  Chest x-ray 10/08/13: No active disease.  Hemoglobin 9.2 (slightly low) with low iron.  Principle Diagnosis: Stomach cancer with obstruction of the outflow tract of the stomach causing nausea and vomiting  Plan for today: We will have a special IV placed in your arm (called a PICC line) so that we can give you concentrated nutritional fluids through your IV. The  surgeons plan to perform surgery to remove the cancer in your stomach 10/10/13. Once you have recuperated from surgery, Dr. Benay Spice will talk with you about further treatment options.  Anticipated discharge date: Depends on progress and recuperation from surgery.

## 2013-10-10 NOTE — Op Note (Signed)
Preoperative Diagnosis: gastric cancer Gastric obstruction gastric cancer  Postoprative Diagnosis: gastric cancer Gastric obstruction gastric cancer  Procedure: Procedure(s): SUBTOTAL GASTRECTOMY WITH porta hepatis and peripancreatic lymph node dissection, Roux-en-Y gastrojejunostomy   Surgeon: Excell Seltzer T   Assistants: none  Anesthesia:  General endotracheal anesthesia  Indications: patient is a 43 year old female with several months of progressive postprandial nausea and vomiting and bloating. Recent endoscopy has revealed an ulcerated mass in the prepyloric area with near total obstruction. The scope was able to be passed into the duodenum which appeared normal. Biopsy showed adenocarcinoma, signet ring type. A CT scan showed no obvious extensive regional disease. I have recommended proceeding with exploration with plan subtotal gastrectomy. The nature of the surgery and risks have been discussed and detailed elsewhere.  Procedure Detail:  Patient was brought to the operating room, placed in the supine position on the operating table, and general endotracheal anesthesia induced. PAS were in place. The abdomen was widely sterilely prepped and draped. She received broad-spectrum preoperative IV antibiotics. Patient time out performed and correct procedure verified. An upper midline incision from the xiphoid to just above the umbilicus was used and dissection carried down through the subcutaneous tissue and midline fascia with cautery and the peritoneum entered under direct vision. There was no ascites. A thorough exploration was performed. There was an approximately 6 cm palpable mass in the distal antrum/prepyloric area that felt fairly mobile. The entire abdominal contents were carefully examined. CT scan had questioned a possible mass in the left lobe of the liver. I took down the falciform ligament and carefully examine the entire liver was able to palpate or see no abnormalities. The  small intestine was run from the ligament of Treitz to the ileocecal valve and was normal. There was no evidence of any peritoneal disease. The stomach was very distended. Nasogastric tube was placed but only partially decompressed as it was full of solid material. I could not feel any nodes along the celiac axis. The tumor did appear to be somewhat adherent to the pancreas and I could feel a couple of small firm lymph nodes in the periduodenal area superiorly and in the lower porta hepatis. The tumor did however appear resectable. The duodenum was soft and appeared normal. Initially I chose a point of proximal resection at about the mid stomach. The omentum was divided with the LigaSure were hand gastroepiploic vessels divided between clamps and tied with 2-0 silk. The lesser curve was dissected at this point. There was a slightly enlarged perigastric lymph node along the lesser curve at this point which was sent for frozen section. The stomach was divided at this point with a single firing of the 90 mm green load stapler. Dissection was then carried along distally taking as much soft tissue as possible along the lesser and greater curve working down toward the tumor. Again the tumor was found to be adherent somewhat to the head of the pancreas with some firm tissue just proximal and superior to the pylorus extending up toward the porta hepatis. The dissection was deepened down into the capsule the head of the pancreas and several peripancreatic lymph nodes taken with the specimen. Superiorly the plane of dissection was taken up to the hepatic artery and over across the porta hepatis at the level of the cystic duct. All fibrofatty and lymph node tissue were then swept inferiorly down the porta hepatis toward the stomach. The hepatic artery was skeletonized and the gastroduodenal artery isolated and divided between clamps and  suture ligated. This exposed the portal vein and the common bile duct which were carefully  protected and all nodes around the structures were taken down inferiorly toward the specimen. A point of soft duodenum at about the junction of the first and second point the duodenum was chosen in the periduodenal tissue dissected superiorly and inferiorly and dissection carried back behind the duodenum. Finally the specimen was dissected away from the head of the pancreas somewhat within the capsule of the pancreas and taking all soft tissue around the head of the pancreas superiorly and inferiorly down toward the duodenum until the tumor was completely up off the head of the pancreas and I dissected down to the junction of the first and second portion of the duodenum. This appeared soft and at least a couple of centimeters away from the distal extent of the tumor. The duodenum was divided at this point with the TA 60 blue load stapler and the specimen was removed. The porta hepatis and peripancreatic and periduodenal tissue had been completely dissected. The duodenal stump was oversewn with a running 2-0 Vicryl. The frozen section on the lesser curve lymph node came back as positive for metastatic adenocarcinoma. I elected to take another several centimeters of stomach resecting further soft tissue along the lesser curve more proximally and took some of the lower celiac soft tissue and lymph nodes with this and after cleaning the greater and lesser curve divided the stomach more proximally with the TA 90 green load stapler again. The frozen section of the proximal margin on the original specimen was negative. This was sent as further additional proximal stomach. I cannot feel any abnormal lymph nodes up around the aorta or celiac trunk. The ligament of Treitz was identified and a mobile portion of proximal jejunum about 20 cm distal was chosen and was divided with the GIA stapler. The mesentery was divided a short distance for mobility. At approximately 30 cm Roux limb was created at this point a side-to-side  jejunojejunostomy was created with a single firing of the GIA stapler and the common enterotomy closed with a TA 60 stapler. It was widely patent. Good blood supply. A few bleeding points along the anastomotic staple line had been oversewn with 3-0 Vicryl. The Roux limb was brought up to the gastric remnant. A posterior row of running 2-0 Vicryl was created between the Roux limb and the staple line of the gastric remnant along the lesser curve. The small bowel and stomach were then opened for about 3-4 cm for anastomosis. A very large amount of semisolid stomach contents was suctioned and evacuated, about 800 cc. The anastomosis was then completed with a full-thickness circumferential suture line of 2-0 Vicryl followed by an anterior seromuscular row 2-0 Vicryl. The anastomosis was widely patent under no tension. Following this the abdomen was thoroughly irrigated. Hemostasis was assured. Tisseel tissue sealant was placed over the gastrojejunostomy, the jejunojejunostomy and around the head of the pancreas. A 19 Blake closed suction drain was brought out through a stab wound in the right upper quadrant and placed near the porta hepatis and anastomosis. The viscera returned to their anatomic position. The midline fascia is closed using running looped #1 PDS beginning at either end the incision and tied centrally. Subcutaneous tissue was irrigated and skin closed with staples. Sponge needle and instrument counts were correct.    Findings: As above  Estimated Blood Loss:  150 cc         Drains: 19 round Blake drain  in right upper quadrant  Blood Given: none          Specimens: subtotal gastrectomy with perigastric, periduodenal, peripancreatic and porta hepatis lymph nodes        Complications:  * No complications entered in OR log *         Disposition: PACU - hemodynamically stable.         Condition: stable

## 2013-10-10 NOTE — Transfer of Care (Signed)
Immediate Anesthesia Transfer of Care Note  Patient: Andrea Best  Procedure(s) Performed: Procedure(s) (LRB): SUBTOTAL GASTRECTOMY WITH GASTRIC JEJUNOSOTMY (N/A)  Patient Location: PACU  Anesthesia Type: General  Level of Consciousness: sedated, patient cooperative and responds to stimulation  Airway & Oxygen Therapy: Patient Spontanous Breathing and Patient connected to face mask oxgen  Post-op Assessment: Report given to PACU RN and Post -op Vital signs reviewed and stable  Post vital signs: Reviewed and stable  Complications: No apparent anesthesia complications

## 2013-10-10 NOTE — Progress Notes (Signed)
Dr. Kalman Shan made aware of patient's CBG results in PACU-195- No Insulin coverage to be given at this time.

## 2013-10-10 NOTE — Progress Notes (Signed)
Brief Pharmacy note: Iron Dextran  Newly diagnosed Gastric Ca, surgery 7/17 with Roux N Y gastrojejunostomy. Patient is Jehovah's Witness and does not desire blood products. Oncologist Benay Spice) ordered Ferritin level 7/15 = 8, below normal range and suggested iron supplementation. Surgery note agrees with iron supplementation. Ok per attending to order iron.  7/18: Iron Dextran 25mg  test dose, then 1000mg  Iron Dextran infusion if no reaction to test dose  Aimed for Hgb of 12, current Hgb 9.  Thank you,   Minda Ditto PharmD Pager [phone] 10/10/2013, 2:19 PM

## 2013-10-10 NOTE — Addendum Note (Signed)
Addendum created 10/10/13 1439 by Zhavia Cunanan C [place] CRNA   Modules edited: Anesthesia LDA

## 2013-10-11 LAB — PHOSPHORUS: Phosphorus: 4.8 mg/dL — ABNORMAL HIGH (ref 2.3–4.6)

## 2013-10-11 LAB — COMPREHENSIVE METABOLIC PANEL
ALK PHOS: 42 U/L (ref 39–117)
ALT: 21 U/L (ref 0–35)
AST: 33 U/L (ref 0–37)
Albumin: 2 g/dL — ABNORMAL LOW (ref 3.5–5.2)
Anion gap: 9 (ref 5–15)
BUN: 8 mg/dL (ref 6–23)
CO2: 25 meq/L (ref 19–32)
Calcium: 8.2 mg/dL — ABNORMAL LOW (ref 8.4–10.5)
Chloride: 99 mEq/L (ref 96–112)
Creatinine, Ser: 0.51 mg/dL (ref 0.50–1.10)
GLUCOSE: 134 mg/dL — AB (ref 70–99)
POTASSIUM: 4.2 meq/L (ref 3.7–5.3)
SODIUM: 133 meq/L — AB (ref 137–147)
Total Bilirubin: 0.2 mg/dL — ABNORMAL LOW (ref 0.3–1.2)
Total Protein: 6.4 g/dL (ref 6.0–8.3)

## 2013-10-11 LAB — CBC
HCT: 29 % — ABNORMAL LOW (ref 36.0–46.0)
Hemoglobin: 9 g/dL — ABNORMAL LOW (ref 12.0–15.0)
MCH: 22.7 pg — AB (ref 26.0–34.0)
MCHC: 31 g/dL (ref 30.0–36.0)
MCV: 73.2 fL — ABNORMAL LOW (ref 78.0–100.0)
PLATELETS: 264 10*3/uL (ref 150–400)
RBC: 3.96 MIL/uL (ref 3.87–5.11)
RDW: 17.8 % — AB (ref 11.5–15.5)
WBC: 22.4 10*3/uL — ABNORMAL HIGH (ref 4.0–10.5)

## 2013-10-11 LAB — GLUCOSE, CAPILLARY
GLUCOSE-CAPILLARY: 105 mg/dL — AB (ref 70–99)
GLUCOSE-CAPILLARY: 111 mg/dL — AB (ref 70–99)
Glucose-Capillary: 125 mg/dL — ABNORMAL HIGH (ref 70–99)
Glucose-Capillary: 140 mg/dL — ABNORMAL HIGH (ref 70–99)
Glucose-Capillary: 115 mg/dL — ABNORMAL HIGH (ref 70–99)
Glucose-Capillary: 107 mg/dL — ABNORMAL HIGH (ref 70–99)

## 2013-10-11 LAB — MAGNESIUM: Magnesium: 1.4 mg/dL — ABNORMAL LOW (ref 1.5–2.5)

## 2013-10-11 MED ORDER — HYDROMORPHONE HCL PF 1 MG/ML IJ SOLN
1.0000 mg | INTRAMUSCULAR | Status: DC | PRN
  Administered 2013-10-11: 2 mg via INTRAVENOUS
  Administered 2013-10-11: 1 mg via INTRAVENOUS
  Administered 2013-10-11: 3 mg via INTRAVENOUS
  Administered 2013-10-11 (×2): 1 mg via INTRAVENOUS
  Administered 2013-10-11: 2 mg via INTRAVENOUS
  Administered 2013-10-11 – 2013-10-12 (×4): 1 mg via INTRAVENOUS
  Administered 2013-10-12: 2 mg via INTRAVENOUS
  Administered 2013-10-12: 1 mg via INTRAVENOUS
  Administered 2013-10-12 (×2): 2 mg via INTRAVENOUS
  Administered 2013-10-12: 1 mg via INTRAVENOUS
  Administered 2013-10-12 – 2013-10-13 (×2): 2 mg via INTRAVENOUS
  Administered 2013-10-13 (×2): 1 mg via INTRAVENOUS
  Administered 2013-10-13 (×2): 2 mg via INTRAVENOUS
  Administered 2013-10-13: 1 mg via INTRAVENOUS
  Administered 2013-10-14 (×5): 2 mg via INTRAVENOUS
  Administered 2013-10-14: 1 mg via INTRAVENOUS
  Administered 2013-10-14 – 2013-10-15 (×7): 2 mg via INTRAVENOUS
  Filled 2013-10-11: qty 2
  Filled 2013-10-11: qty 1
  Filled 2013-10-11 (×4): qty 2
  Filled 2013-10-11 (×2): qty 1
  Filled 2013-10-11 (×3): qty 2
  Filled 2013-10-11: qty 3
  Filled 2013-10-11: qty 2
  Filled 2013-10-11: qty 1
  Filled 2013-10-11 (×4): qty 2
  Filled 2013-10-11 (×2): qty 1
  Filled 2013-10-11 (×6): qty 2
  Filled 2013-10-11: qty 3
  Filled 2013-10-11 (×2): qty 2
  Filled 2013-10-11 (×4): qty 1
  Filled 2013-10-11 (×2): qty 2
  Filled 2013-10-11: qty 3

## 2013-10-11 MED ORDER — PHENOL 1.4 % MT LIQD
1.0000 | OROMUCOSAL | Status: DC | PRN
  Administered 2013-10-11: 1 via OROMUCOSAL
  Filled 2013-10-11: qty 177

## 2013-10-11 MED ORDER — TRACE MINERALS CR-CU-F-FE-I-MN-MO-SE-ZN IV SOLN
INTRAVENOUS | Status: AC
  Administered 2013-10-11: 17:00:00 via INTRAVENOUS
  Filled 2013-10-11: qty 2000

## 2013-10-11 MED ORDER — SODIUM CHLORIDE 0.9 % IV SOLN
INTRAVENOUS | Status: AC
  Administered 2013-10-11: 19:00:00 via INTRAVENOUS

## 2013-10-11 MED ORDER — SALINE SPRAY 0.65 % NA SOLN
1.0000 | NASAL | Status: DC | PRN
  Filled 2013-10-11: qty 44

## 2013-10-11 MED ORDER — HYDROMORPHONE HCL PF 2 MG/ML IJ SOLN: 2.0000 mg | INTRAMUSCULAR | Status: DC | PRN

## 2013-10-11 MED ORDER — MAGNESIUM SULFATE 40 MG/ML IJ SOLN
2.0000 g | Freq: Once | INTRAMUSCULAR | Status: AC
  Administered 2013-10-11: 2 g via INTRAVENOUS
  Filled 2013-10-11 (×2): qty 50

## 2013-10-11 MED ORDER — FAT EMULSION 20 % IV EMUL
250.0000 mL | INTRAVENOUS | Status: AC
  Administered 2013-10-11: 250 mL via INTRAVENOUS
  Filled 2013-10-11: qty 250

## 2013-10-11 NOTE — Progress Notes (Signed)
1 Day Post-Op  Subjective: Complains of pain but meds seem to help  Objective: Vital signs in last 24 hours: Temp:  [97 F (36.1 C)-98.3 F (36.8 C)] 98.2 F (36.8 C) (07/18 0400) Pulse Rate:  [83-121] 104 (07/18 0600) Resp:  [5-19] 10 (07/18 0600) BP: (106-137)/(38-83) 115/60 mmHg (07/18 0600) SpO2:  [92 %-100 %] 100 % (07/18 0600) Last BM Date: 09/30/13  Intake/Output from previous day: 07/17 0701 - 07/18 0700 In: 5568.8 [I.V.:4723.8; NG/GT:120; TPN:725] Out: 3355 [Urine:3050; Emesis/NG output:10; Drains:145; Blood:150] Intake/Output this shift:    Resp: clear to auscultation bilaterally Cardio: regular rate and rhythm GI: tender near incision. quiet. drain output serosanguinous. incision ok  Lab Results:   Recent Labs  10/10/13 0550 10/10/13 2158 10/11/13 0420  WBC 3.7*  --  22.4*  HGB 9.0* 9.4* 9.0*  HCT 30.0* 31.2* 29.0*  PLT 338  --  264   BMET  Recent Labs  10/10/13 0550 10/11/13 0420  NA 140 133*  K 3.9 4.2  CL 106 99  CO2 25 25  GLUCOSE 90 134*  BUN 4* 8  CREATININE 0.55 0.51  CALCIUM 8.6 8.2*   PT/INR No results found for this basename: LABPROT, INR,  in the last 72 hours ABG No results found for this basename: PHART, PCO2, PO2, HCO3,  in the last 72 hours  Studies/Results: No results found.  Anti-infectives: Anti-infectives   Start     Dose/Rate Route Frequency Ordered Stop   10/10/13 1230  ceFAZolin (ANCEF) IVPB 2 g/50 mL premix     2 g 100 mL/hr over 30 Minutes Intravenous  Once 10/10/13 1220 10/10/13 1222   10/10/13 0600  [MAR Hold]  ceFAZolin (ANCEF) IVPB 2 g/50 mL premix     (On MAR Hold since 10/10/13 0659)   2 g 100 mL/hr over 30 Minutes Intravenous On call to O.R. 10/09/13 1244 10/10/13 0743      Assessment/Plan: s/p Procedure(s): SUBTOTAL GASTRECTOMY WITH GASTRIC JEJUNOSOTMY (N/A) Continue ng and bowel rest Continue tpn for nutrition support Will monitor in icu another day  LOS: 3 days    TOTH III,PAUL  S 10/11/2013

## 2013-10-11 NOTE — Progress Notes (Signed)
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 Azithromycin Rash  . Sulfa Antibiotics Rash  . Hydrocodone-Acetaminophen Nausea And Vomiting    Hallucinating   . 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 SHE DOES NOT WANT ANY BLOOD PRODUCTS OR FRACTIONS   Patient Measurements: Height: 5\' 6"  (167.6 cm) Weight: 154 lb 12.2 oz (70.2 kg) IBW/kg (Calculated) : 59.3  Vital Signs: Temp: 98.2 F (36.8 C) (07/18 0400) Temp src: Oral (07/18 0400) BP: 115/60 mmHg (07/18 0600) Pulse Rate: 104 (07/18 0600) Intake/Output from previous day: 07/17 0701 - 07/18 0700 In: 5568.8 [I.V.:4723.8; NG/GT:120; TPN:725] Out: 3355 [Urine:3050; Emesis/NG output:10; Drains:145; Blood:150] Intake/Output from this shift:    Labs:  Recent Labs  10/09/13 0427 10/10/13 0550 10/10/13 2158 10/11/13 0420  WBC 3.8* 3.7*  --  22.4*  HGB 9.2* 9.0* 9.4* 9.0*  HCT 30.4* 30.0* 31.2* 29.0*  PLT 331 338  --  264    Recent Labs  10/09/13 0427 10/09/13 0433 10/10/13 0550 10/11/13 0420  NA 139  --  140 133*  K 3.6*  --  3.9 4.2  CL 105  --  106 99  CO2 24  --  25 25  GLUCOSE 87  --  90 134*  BUN 8  --  4* 8  CREATININE 0.58  --  0.55 0.51  CALCIUM 8.6  --  8.6 8.2*  MG  --  1.9 1.8 1.4*  PHOS  --  4.8* 5.1* 4.8*  PROT 7.2  --  6.8 6.4  ALBUMIN 2.5*  --  2.3* 2.0*  AST 32  --  27 33  ALT 22  --  19 21  ALKPHOS 45  --  43 42  BILITOT 0.3  --  0.2* 0.2*  PREALBUMIN  --   --  7.9*  --   TRIG  --   --  94  --    Estimated Creatinine Clearance: 87.5 ml/min (by C-G formula based on Cr of 0.51).    Recent Labs  10/10/13 1929 10/10/13 2350 10/11/13 0420  GLUCAP 188* 140* 125*   Medications:  Scheduled:  . insulin aspart  0-9 Units Subcutaneous 6 times per day  . iron dextran (INFED/DEXFERRUM) infusion  25 mg Intravenous Once   Followed by  . iron dextran (INFED/DEXFERRUM) infusion  1,000 mg Intravenous  Once  . ondansetron (ZOFRAN) IV  4 mg Intravenous 4 times per day  . pantoprazole (PROTONIX) IV  40 mg Intravenous QHS  . sodium chloride  10-40 mL Intracatheter Q12H   Infusions:  . dextrose 5 % and 0.9% NaCl 75 mL/hr at 10/11/13 0537  . Marland KitchenTPN (CLINIMIX-E) Adult 40 mL/hr at 10/10/13 1725   And  . fat emulsion 250 mL (10/10/13 1726)   Insulin Requirements in the past 24 hours:  9 units SSI/24h, post-op CBGs elevated but have improved Novolog sensitive scale q4hr  Current Nutrition, IV fluids:  NPO D5NS at 75 ml/hr  Assessment: 89 YOF presents with N/V, weight loss d/t difficulty eating with recent dx of Gastric cancer. EGD on 09/22/2013 reveald findings of a partially obstructing oozing cratered gastric ulcer in the gastric antrum. 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 TPN started 7/16 for GOO. Surgery consulted, s/p subtotal gastrectomy with gastric jejunostomy on 7/17.    Electrolytes: Phos 4.8, Ca corr 9.8;  Ca x Phos product within range (47), Mg =  1.4, Na = 133  Renal: SCr WNL, I/O = 5.5L/3.3L (+ 2.2L)  LFT's: wnl  Glucose control: elevated post-op but improving  Triglycerides: 94  Pre-Albumin: 7.9 (7/17) - reflects depleted protein stores  Access: PICC placed 7/16 Day # 3 TNA  Nutritional Goals:  RD consulted: Kcal: 2000-2200  Protein: 85-100 gram  Fluid: >/=2100 ml/daily  Clinimix 5/15 at 80 ml/hr + Lipids 20% at 10 ml/hr would deliver: 1850 kCal per day 96 grams of protein per day  Plan: at 1800 today  Increase Clinimix E 5/15 to 60 ml/hr (titrating to goal of 62ml/hr as tolerated)  Watch Phos - may increase with increase TPN rate, depending on degree of elevation may need to convert to electrolyte free product  Continue Lipids 20% at 10 ml/hr  Remove D5W from MIVF to reduce risk of hyperglycemia/reduce rate to 34ml/hr at 6pm  Replace magnesium with magnesium sulfate 2gm IVPB x 1  Full TNA labs  Monday/Thursday  MVI and Trace elements daily  Continue sensitive Novolog scale q4hr, reduce to q6hr once stable with TNA closer to goal range  Follow up daily: BMP, Mag, Phos levels in am  Doreene Eland, PharmD, BCPS.   Pager: [phone] 10/11/2013, 8:29 AM

## 2013-10-11 NOTE — Progress Notes (Signed)
10/11/13 1617 10/11/13 1700  Vitals  Temp 97.9 F (36.6 C) --   Temp src Oral --   BP --  130/73 mmHg  MAP (mmHg) --  88  Pulse Rate --  ! 135  ECG Heart Rate --  ! 137  Resp --  15   Dr. Rockne Menghini made aware of patient HR. Pt. Asymptomatic. New orders given. Will continue to monitor.

## 2013-10-11 NOTE — Progress Notes (Signed)
Progress Note   TERREL MANALO HUT:[id number] DOB: [birth year] DOA: 10/08/2013 PCP: Conni Slipper, MD   Brief Narrative:   Andrea Best is an 43 y.o. 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 She underwent subtotal gastrectomy with porta hepatis and peripancreatic lymph node dissection with a Roux-en-Y gastrojejunostomy on 10/10/13.  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  In SDU post-op.  Dr. Benay Spice following for consideration of adjuvant treatment options.  Active Problems:   Hypomagnesemia / hypophosphatemia  Replete in TNA.    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  Iron therapy ordered per pharmacy dosing.  Hemoglobin stable post operatively.    Protein-calorie malnutrition, severe  Pt meets criteria for severe MALNUTRITION in the context of chronic illness as evidenced by PO intake < 75% for > one month, 16% body weight loss in 4 months.  PICC line placed for TNA administration.    Lupus/Sjogren syndrome  Plaquenil and prednisone currently on hold.    DVT Prophylaxis  On subcutaneous heparin.  Code Status: Full. Family Communication: Mother updated at the bedside 10/09/13, no family currently at the bedside. Disposition Plan: Home when stable.   IV Access:    PICC placed 10/09/13.   Procedures and diagnostic studies:    Chest x-ray 10/08/13: No active disease.  Subtotal gastrectomy with porta hepatis and  peripancreatic lymph node dissection, Roux-en-Y gastrojejunostomy 10/10/13 by Dr. Excell Seltzer.   Medical Consultants:    Dr. Betsy Coder, Oncology.  Dr. Excell Seltzer, Surgery.   Other Consultants:    Dietitian  Pharmacy  Anti-Infectives:    None.  Subjective:    [name] remains somewhat sedated postoperatively but opens her eyes to voice. She complains of pain uncontrolled by current pain regimen, but seems very sedated. No nausea or vomiting, NG tube remains in place.  Objective:    Filed Vitals:   10/11/13 0300 10/11/13 0400 10/11/13 0500 10/11/13 0600  BP: 120/56 113/59 108/44 115/60  Pulse: 118 113 109 104  Temp:  98.2 F (36.8 C)    TempSrc:  Oral    Resp: 13 19 9 10   Height:      Weight:      SpO2: 98% 99% 100% 100%    Intake/Output Summary (Last 24 hours) at 10/11/13 0704 Last data filed at 10/11/13 0600  Gross per 24 hour  Intake 5568.75 ml  Output   3355 ml  Net 2213.75 ml    Exam: Gen:  NAD, sedated postop Cardiovascular:  Tachycardic, No M/R/G Respiratory:  Lungs diminished Gastrointestinal:  Abdomen soft with dressings intact and unsoiled. Suction drain RUQ. Extremities:  No C/E/C   Data Reviewed:    Labs: Basic Metabolic Panel:  Recent Labs Lab 10/08/13 2058 10/09/13 0427 10/09/13 0433 10/10/13 0550 10/11/13 0420  NA 138 139  --  140 133*  K 4.5 3.6*  --  3.9 4.2  CL 102 105  --  106 99  CO2 26 24  --  25 25  GLUCOSE 89 87  --  90 134*  BUN 8 8  --  4* 8  CREATININE 0.68 0.58  --  0.55 0.51  CALCIUM 9.6 8.6  --  8.6 8.2*  MG  --   --  1.9 1.8 1.4*  PHOS  --   --  4.8* 5.1* 4.8*   GFR Estimated Creatinine Clearance: 87.5 ml/min (by C-G formula based on Cr of 0.51). Liver Function Tests:  Recent Labs Lab 10/08/13 2058 10/09/13 0427 10/10/13 0550 10/11/13 0420  AST 34 32 27 33  ALT 25 22 19 21   ALKPHOS 50 45 43 42  BILITOT 0.4 0.3 0.2* 0.2*  PROT 8.3 7.2 6.8 6.4  ALBUMIN 2.9* 2.5* 2.3*  2.0*   CBC:  Recent Labs Lab 10/08/13 2058 10/09/13 0427 10/10/13 0550 10/10/13 2158 10/11/13 0420  WBC 4.5 3.8* 3.7*  --  22.4*  NEUTROABS  --   --  1.5*  --   --   HGB 9.9* 9.2* 9.0* 9.4* 9.0*  HCT 32.9* 30.4* 30.0* 31.2* 29.0*  MCV 72.1* 72.0* 73.0*  --  73.2*  PLT 386 331 338  --  264   Anemia work up:  Recent Labs  10/08/13 2058  FERRITIN 8*   Sepsis Labs:  Recent Labs Lab 10/08/13 2058 10/09/13 0427 10/10/13 0550 10/11/13 0420  WBC 4.5 3.8* 3.7* 22.4*   Microbiology Recent Results (from the past 240 hour(s))  MRSA PCR SCREENING     Status: None   Collection Time    10/10/13  3:39 PM      Result Value Ref Range Status   MRSA by PCR NEGATIVE  NEGATIVE Final   Comment:            The GeneXpert MRSA Assay (FDA     approved for NASAL specimens     only), is one component of a     comprehensive MRSA colonization     surveillance program. It is not     intended to diagnose MRSA     infection nor to guide or     monitor treatment for     MRSA infections.     Medications:   . insulin aspart  0-9 Units Subcutaneous 6 times per day  . iron dextran (INFED/DEXFERRUM) infusion  25 mg Intravenous Once   Followed by  . iron dextran (INFED/DEXFERRUM) infusion  1,000 mg Intravenous Once  . ondansetron (ZOFRAN) IV  4 mg Intravenous 4 times per day  . pantoprazole (PROTONIX) IV  40 mg Intravenous QHS  . sodium chloride  10-40 mL Intracatheter Q12H   Continuous Infusions: . dextrose 5 % and 0.9% NaCl 75 mL/hr at 10/11/13 0537  . Marland KitchenTPN (CLINIMIX-E) Adult 40 mL/hr at 10/10/13 1725   And  . fat emulsion 250 mL (10/10/13 1726)    Time spent: 35 minutes. The patient is medically complex.    LOS: 3 days   [facility] Pager [phone]. If unable to reach me by pager, please call my cell phone at [phone].  *Please refer to [email or web address], password [email or web address] to get updated schedule on who will round on this patient, as hospitalists switch teams  weekly. If 7PM-7AM, please contact night-coverage at [email or web address], password [email or web address] for any overnight needs.  10/11/2013, 7:04 AM    **Disclaimer: This note was dictated with voice recognition software. Similar sounding words can inadvertently be transcribed and this note may contain transcription errors which may not have been corrected upon publication of note.**   Information printed out and reviewed with the patient/family:     In an effort to  keep you and your family informed about your hospital stay, I am providing you with this information sheet. If you or your family have any questions, please do not hesitate to have the nursing staff page me to set up a meeting time.  Also note that the hospitalist doctors typically change on Tuesdays or Wednesdays to a different hospitalist doctor.  SHAKYRA MATTERA 10/11/2013 3 (Number of days in the hospital)  Treatment team:  Dr. Jacquelynn Cree, Hospitalist (Internist)  Dr. Julieanne Manson, cancer specialist  Dr. Excell Seltzer, surgery  Pertinent labs / studies:  Chest x-ray 10/08/13: No active disease.  Hemoglobin 9.2 (slightly low) with low iron.  Principle Diagnosis: Stomach cancer with obstruction of the outflow tract of the stomach causing nausea and vomiting  Plan for today: We will have a special IV placed in your arm (called a PICC line) so that we can give you concentrated nutritional fluids through your IV. The surgeons plan to perform surgery to remove the cancer in your stomach 10/10/13. Once you have recuperated from surgery, Dr. Benay Spice will talk with you about further treatment options.  Anticipated discharge date: Depends on progress and recuperation from surgery.

## 2013-10-12 ENCOUNTER — Inpatient Hospital Stay (HOSPITAL_COMMUNITY): Payer: 59

## 2013-10-12 DIAGNOSIS — R509 Fever, unspecified: Secondary | ICD-10-CM

## 2013-10-12 DIAGNOSIS — R000 Tachycardia, unspecified: Secondary | ICD-10-CM | POA: Diagnosis not present

## 2013-10-12 LAB — GLUCOSE, CAPILLARY
GLUCOSE-CAPILLARY: 106 mg/dL — AB (ref 70–99)
GLUCOSE-CAPILLARY: 107 mg/dL — AB (ref 70–99)
GLUCOSE-CAPILLARY: 90 mg/dL (ref 70–99)
Glucose-Capillary: 114 mg/dL — ABNORMAL HIGH (ref 70–99)
Glucose-Capillary: 125 mg/dL — ABNORMAL HIGH (ref 70–99)
Glucose-Capillary: 120 mg/dL — ABNORMAL HIGH (ref 70–99)

## 2013-10-12 LAB — MAGNESIUM: Magnesium: 1.8 mg/dL (ref 1.5–2.5)

## 2013-10-12 LAB — URINALYSIS, ROUTINE W REFLEX MICROSCOPIC
BILIRUBIN URINE: NEGATIVE
Glucose, UA: NEGATIVE mg/dL
Hgb urine dipstick: NEGATIVE
KETONES UR: NEGATIVE mg/dL
Leukocytes, UA: NEGATIVE
NITRITE: NEGATIVE
PROTEIN: NEGATIVE mg/dL
Specific Gravity, Urine: 1.008 (ref 1.005–1.030)
UROBILINOGEN UA: 0.2 mg/dL (ref 0.0–1.0)
pH: 8 (ref 5.0–8.0)

## 2013-10-12 LAB — BASIC METABOLIC PANEL
Anion gap: 5 (ref 5–15)
BUN: 6 mg/dL (ref 6–23)
CALCIUM: 8.1 mg/dL — AB (ref 8.4–10.5)
CO2: 27 meq/L (ref 19–32)
Chloride: 99 mEq/L (ref 96–112)
Creatinine, Ser: 0.51 mg/dL (ref 0.50–1.10)
GFR calc Af Amer: >90 mL/min (ref >90)
GFR calc non Af Amer: >90 mL/min (ref >90)
GLUCOSE: 136 mg/dL — AB (ref 70–99)
Potassium: 4.1 mEq/L (ref 3.7–5.3)
SODIUM: 131 meq/L — AB (ref 137–147)

## 2013-10-12 LAB — CBC
HCT: 26.3 % — ABNORMAL LOW (ref 36.0–46.0)
HEMOGLOBIN: 8 g/dL — AB (ref 12.0–15.0)
MCH: 22.3 pg — ABNORMAL LOW (ref 26.0–34.0)
MCHC: 30.4 g/dL (ref 30.0–36.0)
MCV: 73.3 fL — ABNORMAL LOW (ref 78.0–100.0)
Platelets: 194 10*3/uL (ref 150–400)
RBC: 3.59 MIL/uL — AB (ref 3.87–5.11)
RDW: 18.1 % — ABNORMAL HIGH (ref 11.5–15.5)
WBC: 18.9 10*3/uL — ABNORMAL HIGH (ref 4.0–10.5)

## 2013-10-12 LAB — PHOSPHORUS: Phosphorus: 2.3 mg/dL (ref 2.3–4.6)

## 2013-10-12 MED ORDER — SODIUM CHLORIDE 0.9 % IV BOLUS (SEPSIS)
500.0000 mL | Freq: Once | INTRAVENOUS | Status: AC
  Administered 2013-10-12: 500 mL via INTRAVENOUS

## 2013-10-12 MED ORDER — LEVOFLOXACIN IN D5W 500 MG/100ML IV SOLN
500.0000 mg | INTRAVENOUS | Status: DC
  Administered 2013-10-12 – 2013-10-13 (×2): 500 mg via INTRAVENOUS
  Filled 2013-10-12 (×5): qty 100

## 2013-10-12 MED ORDER — CHLORHEXIDINE GLUCONATE 0.12 % MT SOLN
15.0000 mL | Freq: Two times a day (BID) | OROMUCOSAL | Status: DC
  Administered 2013-10-12 – 2013-10-27 (×25): 15 mL via OROMUCOSAL
  Filled 2013-10-12 (×32): qty 15

## 2013-10-12 MED ORDER — FAT EMULSION 20 % IV EMUL
250.0000 mL | INTRAVENOUS | Status: AC
  Administered 2013-10-12: 250 mL via INTRAVENOUS
  Filled 2013-10-12: qty 250

## 2013-10-12 MED ORDER — LIP MEDEX EX OINT
TOPICAL_OINTMENT | CUTANEOUS | Status: DC | PRN
  Filled 2013-10-12: qty 7

## 2013-10-12 MED ORDER — SODIUM CHLORIDE 0.9 % IV SOLN
INTRAVENOUS | Status: DC
  Administered 2013-10-13: 30 mL via INTRAVENOUS
  Administered 2013-10-14 – 2013-10-25 (×4): via INTRAVENOUS

## 2013-10-12 MED ORDER — ACETAMINOPHEN 10 MG/ML IV SOLN
1000.0000 mg | Freq: Once | INTRAVENOUS | Status: AC
  Administered 2013-10-12: 1000 mg via INTRAVENOUS
  Filled 2013-10-12: qty 100

## 2013-10-12 MED ORDER — BIOTENE DRY MOUTH MT LIQD
15.0000 mL | Freq: Two times a day (BID) | OROMUCOSAL | Status: DC
  Administered 2013-10-12 – 2013-10-27 (×26): 15 mL via OROMUCOSAL

## 2013-10-12 MED ORDER — TRACE MINERALS CR-CU-F-FE-I-MN-MO-SE-ZN IV SOLN
INTRAVENOUS | Status: AC
  Administered 2013-10-12: 17:00:00 via INTRAVENOUS
  Filled 2013-10-12: qty 2000

## 2013-10-12 NOTE — Progress Notes (Signed)
Progress Note   Andrea Best:[id number] DOB: 20-Mar-1971 DOA: 10/08/2013 PCP: Conni Slipper, MD   Brief Narrative:   Andrea Best is an 43 y.o. 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 She underwent subtotal gastrectomy with porta hepatis and peripancreatic lymph node dissection with a Roux-en-Y gastrojejunostomy on 10/10/13.  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  In SDU post-op.  Dr. Benay Spice following for consideration of adjuvant treatment options.  Active Problems:   Postoperative Fever/tachycardia  Spiked a fever to 102.61F overnight and tachycardic to the 141 (likely multifactorial with fever and anemia contributory).  Fluid bolus ordered by surgery.  Check CXR.  WBC is improving.    Hypomagnesemia / hypophosphatemia  Repleted in TNA.    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  Iron therapy ordered per pharmacy dosing.  Hemoglobin dropped 1 g overnight, but otherwise stable at 8 mg/dL.    Protein-calorie malnutrition, severe  Pt meets criteria for severe MALNUTRITION in the context of chronic illness as evidenced by PO intake < 75% for > one month, 16% body weight loss in 4 months.  PICC line placed for TNA administration.    Lupus/Sjogren syndrome  Plaquenil and prednisone currently on hold.    DVT Prophylaxis  On subcutaneous heparin.  Code Status: Full. Family Communication: Mother updated at the bedside  10/09/13, no family currently at the bedside. Disposition Plan: Home when stable.   IV Access:    PICC placed 10/09/13.   Procedures and diagnostic studies:    Chest x-ray 10/08/13: No active disease.  Subtotal gastrectomy with porta hepatis and peripancreatic lymph node dissection, Roux-en-Y gastrojejunostomy 10/10/13 by Dr. Excell Seltzer.  Chest x-ray 10/12/13: Ordered.   Medical Consultants:    Dr. Betsy Coder, Oncology.  Dr. Excell Seltzer, Surgery.   Other Consultants:    Dietitian  Pharmacy  Anti-Infectives:    None.  Subjective:    Andrea Best is more awake and alert. She feels better. Pain is controlled. No shortness of breath but does complain of a intermittent cough with clear sputum. No flatus yet.  Objective:    Filed Vitals:   10/12/13 0300 10/12/13 0400 10/12/13 0500 10/12/13 0600  BP: 103/46 91/42 114/55 107/62  Pulse: 129 141 133 140  Temp:  102.1 F (38.9 C)  101.3 F (38.5 C)  TempSrc:  Oral  Oral  Resp: 17 15 8 16   Height:      Weight:  74.2 kg (163 lb 9.3 oz)    SpO2: 99% 95% 98% 94%    Intake/Output Summary (Last 24 hours) at 10/12/13 0711 Last data filed at 10/12/13 0600  Gross per 24 hour  Intake   3495 ml  Output   2235 ml  Net   1260 ml    Exam: Gen:  NAD, awake/alert Cardiovascular:  Tachycardic, No M/R/G Respiratory:  Lungs diminished Gastrointestinal:  Abdomen soft with dressings intact and unsoiled. Suction drain RUQ. Extremities:  No C/E/C   Data Reviewed:    Labs: Basic Metabolic Panel:  Recent Labs Lab 10/08/13 2058 10/09/13  3009 10/09/13 0433 10/10/13 0550 10/11/13 0420 10/12/13 0433  NA 138 139  --  140 133* 131*  K 4.5 3.6*  --  3.9 4.2 4.1  CL 102 105  --  106 99 99  CO2 26 24  --  25 25 27   GLUCOSE 89 87  --  90 134* 136*  BUN 8 8  --  4* 8 6  CREATININE 0.68 0.58  --  0.55 0.51 0.51  CALCIUM 9.6 8.6  --  8.6 8.2* 8.1*  MG  --   --  1.9 1.8 1.4* 1.8  PHOS  --   --   4.8* 5.1* 4.8* 2.3   GFR Estimated Creatinine Clearance: 96.4 ml/min (by C-G formula based on Cr of 0.51). Liver Function Tests:  Recent Labs Lab 10/08/13 2058 10/09/13 0427 10/10/13 0550 10/11/13 0420  AST 34 32 27 33  ALT 25 22 19 21   ALKPHOS 50 45 43 42  BILITOT 0.4 0.3 0.2* 0.2*  PROT 8.3 7.2 6.8 6.4  ALBUMIN 2.9* 2.5* 2.3* 2.0*   CBC:  Recent Labs Lab 10/08/13 2058 10/09/13 0427 10/10/13 0550 10/10/13 2158 10/11/13 0420 10/12/13 0440  WBC 4.5 3.8* 3.7*  --  22.4* 18.9*  NEUTROABS  --   --  1.5*  --   --   --   HGB 9.9* 9.2* 9.0* 9.4* 9.0* 8.0*  HCT 32.9* 30.4* 30.0* 31.2* 29.0* 26.3*  MCV 72.1* 72.0* 73.0*  --  73.2* 73.3*  PLT 386 331 338  --  264 194   Microbiology Recent Results (from the past 240 hour(s))  MRSA PCR SCREENING     Status: None   Collection Time    10/10/13  3:39 PM      Result Value Ref Range Status   MRSA by PCR NEGATIVE  NEGATIVE Final   Comment:            The GeneXpert MRSA Assay (FDA     approved for NASAL specimens     only), is one component of a     comprehensive MRSA colonization     surveillance program. It is not     intended to diagnose MRSA     infection nor to guide or     monitor treatment for     MRSA infections.     Medications:   . acetaminophen  1,000 mg Intravenous Once  . insulin aspart  0-9 Units Subcutaneous 6 times per day  . ondansetron (ZOFRAN) IV  4 mg Intravenous 4 times per day  . pantoprazole (PROTONIX) IV  40 mg Intravenous QHS  . sodium chloride  500 mL Intravenous Once  . sodium chloride  10-40 mL Intracatheter Q12H   Continuous Infusions: . sodium chloride 50 mL/hr at 10/11/13 1844  . Marland KitchenTPN (CLINIMIX-E) Adult 60 mL/hr at 10/11/13 1727   And  . fat emulsion 250 mL (10/11/13 1727)    Time spent: 35 minutes. The patient is medically complex.    LOS: 4 days   [facility] Pager [phone]. If unable to reach me by pager, please call my cell phone at  [phone].  *Please refer to [email or web address], password [email or web address] to get updated schedule on who will round on this patient, as hospitalists switch teams weekly. If 7PM-7AM, please contact night-coverage at [email or web address], password [email or web address] for any overnight needs.  10/12/2013, 7:11 AM    **Disclaimer: This note was dictated with voice recognition software. Similar sounding words can inadvertently be transcribed and this note  may contain transcription errors which may not have been corrected upon publication of note.**

## 2013-10-12 NOTE — Progress Notes (Signed)
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 Azithromycin Rash  . Sulfa Antibiotics Rash  . Hydrocodone-Acetaminophen Nausea And Vomiting    Hallucinating   . 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 SHE DOES NOT WANT ANY BLOOD PRODUCTS OR FRACTIONS   Patient Measurements: Height: 5\' 6"  (167.6 cm) Weight: 163 lb 9.3 oz (74.2 kg) IBW/kg (Calculated) : 59.3  Vital Signs: Temp: 100.2 F (37.9 C) (07/19 0800) Temp src: Axillary (07/19 0800) BP: 103/45 mmHg (07/19 1000) Pulse Rate: 108 (07/19 1000) Intake/Output from previous day: 07/18 0701 - 07/19 0700 In: 3495 [I.V.:1375; NG/GT:180; IV Piggyback:620; TPN:1320] Out: 2385 [Urine:1890; Emesis/NG output:390; Drains:105] Intake/Output from this shift: Total I/O In: 30 [NG/GT:30] Out: 300 [Urine:300]  Labs:  Recent Labs  10/10/13 0550 10/10/13 2158 10/11/13 0420 10/12/13 0440  WBC 3.7*  --  22.4* 18.9*  HGB 9.0* 9.4* 9.0* 8.0*  HCT 30.0* 31.2* 29.0* 26.3*  PLT 338  --  264 194    Recent Labs  10/10/13 0550 10/11/13 0420 10/12/13 0433  NA 140 133* 131*  K 3.9 4.2 4.1  CL 106 99 99  CO2 25 25 27   GLUCOSE 90 134* 136*  BUN 4* 8 6  CREATININE 0.55 0.51 0.51  CALCIUM 8.6 8.2* 8.1*  MG 1.8 1.4* 1.8  PHOS 5.1* 4.8* 2.3  PROT 6.8 6.4  --   ALBUMIN 2.3* 2.0*  --   AST 27 33  --   ALT 19 21  --   ALKPHOS 43 42  --   BILITOT 0.2* 0.2*  --   PREALBUMIN 7.9*  --   --   TRIG 94  --   --    Estimated Creatinine Clearance: 96.4 ml/min (by C-G formula based on Cr of 0.51).    Recent Labs  10/12/13 0055 10/12/13 0428 10/12/13 0741  GLUCAP 90 114* 107*   Medications:  Scheduled:  . insulin aspart  0-9 Units Subcutaneous 6 times per day  . ondansetron (ZOFRAN) IV  4 mg Intravenous 4 times per day  . pantoprazole (PROTONIX) IV  40 mg Intravenous QHS  . sodium chloride  10-40 mL Intracatheter Q12H   Infusions:  . sodium  chloride 50 mL/hr at 10/11/13 1844  . Marland KitchenTPN (CLINIMIX-E) Adult 60 mL/hr at 10/11/13 1727   And  . fat emulsion 250 mL (10/11/13 1727)   Insulin Requirements in the past 24 hours:  0 units SSI/24h, post-op CBGs elevated but have improved Novolog sensitive scale q4hr  Current Nutrition, IV fluids:  NPO NS at 50 ml/hr  Assessment: 53 YOF presents with N/V, weight loss d/t difficulty eating with recent dx of Gastric cancer. EGD on 09/22/2013 reveald findings of a partially obstructing oozing cratered gastric ulcer in the gastric antrum. 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 TPN started 7/16 for GOO. Surgery consulted, s/p subtotal gastrectomy with gastric jejunostomy on 7/17.    Electrolytes: Phos = 2.3 (improved after being increased), Ca corr 9.8;  Mg = 1.8, Na = 131, Corr Ca = 9.7 (WNL)  Renal: SCr WNL, I/O = 3.6L/1.9L (+ 1.2L), NGT = 34ml out, drain 133ml out  LFT's: WNL  Glucose control: at goal of  150mg /dl, no hypoglycemia  Triglycerides: 94  Pre-Albumin: 7.9 (7/17) - reflects depleted protein stores  Access: PICC placed 7/16 Day # 4 TNA  Nutritional Goals:  RD consulted: Kcal: 2000-2200  Protein: 85-100 gram  Fluid: >/=2100 ml/daily  Clinimix 5/15 at 80 ml/hr + Lipids 20% at 10 ml/hr would deliver: 1850 kCal per day 96 grams of protein per day  Plan: at 1800 today  Increase Clinimix E 5/15 to goal rate 80 ml/hr  Hyponatremia - likely from free fluid in TPN - unable to change adjust electrolytes with clinimix product  Continue Lipids 20% at 10 ml/hr  Reduce MIVF to 41ml/hr at 6pm for incTPN rate  Full TNA labs Monday/Thursday  MVI and Trace elements daily  Continue sensitive Novolog scale q4hr, reduce to q6hr once stable with TNA closer to goal range  Doreene Eland, PharmD, BCPS.   Pager: [phone] 10/12/2013, 10:59 AM

## 2013-10-12 NOTE — Progress Notes (Signed)
2 Days Post-Op  Subjective: She feels better today. She has developed some tachycardia and fever overnight  Objective: Vital signs in last 24 hours: Temp:  [97.9 F (36.6 C)-102.1 F (38.9 C)] 102.1 F (38.9 C) (07/19 0400) Pulse Rate:  [113-139] 129 (07/19 0300) Resp:  [8-21] 17 (07/19 0300) BP: (96-136)/(38-109) 103/46 mmHg (07/19 0300) SpO2:  [93 %-100 %] 99 % (07/19 0300) Weight:  [163 lb 9.3 oz (74.2 kg)] 163 lb 9.3 oz (74.2 kg) (07/19 0400) Last BM Date: 09/30/13  Intake/Output from previous day: 07/18 0701 - 07/19 0700 In: 3495 [I.V.:1375; NG/GT:180; IV Piggyback:620; TPN:1320] Out: 3299 [Urine:1565; Emesis/NG output:190; Drains:60] Intake/Output this shift: Total I/O In: 2426 [I.V.:500; NG/GT:90; TPN:700] Out: 855 [Urine:825; Drains:30]  Resp: clear to auscultation bilaterally Cardio: regular rate and rhythm and tachy GI: softer and less tender. quiet. drain output serosanguinous  Lab Results:   Recent Labs  10/11/13 0420 10/12/13 0440  WBC 22.4* 18.9*  HGB 9.0* 8.0*  HCT 29.0* 26.3*  PLT 264 194   BMET  Recent Labs  10/11/13 0420 10/12/13 0433  NA 133* 131*  K 4.2 4.1  CL 99 99  CO2 25 27  GLUCOSE 134* 136*  BUN 8 6  CREATININE 0.51 0.51  CALCIUM 8.2* 8.1*   PT/INR No results found for this basename: LABPROT, INR,  in the last 72 hours ABG No results found for this basename: PHART, PCO2, PO2, HCO3,  in the last 72 hours  Studies/Results: No results found.  Anti-infectives: Anti-infectives   Start     Dose/Rate Route Frequency Ordered Stop   10/10/13 1230  ceFAZolin (ANCEF) IVPB 2 g/50 mL premix     2 g 100 mL/hr over 30 Minutes Intravenous  Once 10/10/13 1220 10/10/13 1222   10/10/13 0600  [MAR Hold]  ceFAZolin (ANCEF) IVPB 2 g/50 mL premix     (On MAR Hold since 10/10/13 0659)   2 g 100 mL/hr over 30 Minutes Intravenous On call to O.R. 10/09/13 1244 10/10/13 0743      Assessment/Plan: s/p Procedure(s): SUBTOTAL GASTRECTOMY WITH  GASTRIC JEJUNOSOTMY (N/A) Will give IV acetaminophen for fever Encourage incentive spirometer Overall she seems to be better. Source of fever not clear Will give fluid bolus for tachycardia and continue to monitor in icu  LOS: 4 days    TOTH III,Andrea Best S 10/12/2013

## 2013-10-12 NOTE — Progress Notes (Signed)
Patient has been experiencing urinary frequency about every 45 minutes to an hour since 1400. Patient voided 200cc at 1900 and Post void residual was 334 ml.  Dr. Marcello Moores notified and new orders received.

## 2013-10-13 DIAGNOSIS — E46 Unspecified protein-calorie malnutrition: Secondary | ICD-10-CM

## 2013-10-13 LAB — PHOSPHORUS: Phosphorus: 2.9 mg/dL (ref 2.3–4.6)

## 2013-10-13 LAB — DIFFERENTIAL
BASOS ABS: 0 10*3/uL (ref 0.0–0.1)
Basophils Relative: 0 % (ref 0–1)
EOS ABS: 0 10*3/uL (ref 0.0–0.7)
Eosinophils Relative: 0 % (ref 0–5)
LYMPHS PCT: 7 % — AB (ref 12–46)
Lymphs Abs: 1.2 10*3/uL (ref 0.7–4.0)
MONO ABS: 1.5 10*3/uL — AB (ref 0.1–1.0)
Monocytes Relative: 9 % (ref 3–12)
NEUTROS PCT: 84 % — AB (ref 43–77)
Neutro Abs: 14.5 10*3/uL — ABNORMAL HIGH (ref 1.7–7.7)

## 2013-10-13 LAB — GLUCOSE, CAPILLARY
GLUCOSE-CAPILLARY: 128 mg/dL — AB (ref 70–99)
GLUCOSE-CAPILLARY: 134 mg/dL — AB (ref 70–99)
Glucose-Capillary: 125 mg/dL — ABNORMAL HIGH (ref 70–99)
Glucose-Capillary: 123 mg/dL — ABNORMAL HIGH (ref 70–99)
Glucose-Capillary: 120 mg/dL — ABNORMAL HIGH (ref 70–99)

## 2013-10-13 LAB — COMPREHENSIVE METABOLIC PANEL
ALT: 12 U/L (ref 0–35)
ANION GAP: 8 (ref 5–15)
AST: 22 U/L (ref 0–37)
Albumin: 1.6 g/dL — ABNORMAL LOW (ref 3.5–5.2)
Alkaline Phosphatase: 67 U/L (ref 39–117)
BILIRUBIN TOTAL: 0.2 mg/dL — AB (ref 0.3–1.2)
BUN: 6 mg/dL (ref 6–23)
CALCIUM: 8.4 mg/dL (ref 8.4–10.5)
CHLORIDE: 100 meq/L (ref 96–112)
CO2: 26 meq/L (ref 19–32)
CREATININE: 0.44 mg/dL — AB (ref 0.50–1.10)
GFR calc Af Amer: >90 mL/min (ref >90)
Glucose, Bld: 127 mg/dL — ABNORMAL HIGH (ref 70–99)
Potassium: 4 mEq/L (ref 3.7–5.3)
Sodium: 134 mEq/L — ABNORMAL LOW (ref 137–147)
Total Protein: 6.2 g/dL (ref 6.0–8.3)

## 2013-10-13 LAB — CBC
HCT: 25.1 % — ABNORMAL LOW (ref 36.0–46.0)
Hemoglobin: 7.7 g/dL — ABNORMAL LOW (ref 12.0–15.0)
MCH: 22.3 pg — AB (ref 26.0–34.0)
MCHC: 30.7 g/dL (ref 30.0–36.0)
MCV: 72.8 fL — ABNORMAL LOW (ref 78.0–100.0)
PLATELETS: 161 10*3/uL (ref 150–400)
RBC: 3.45 MIL/uL — ABNORMAL LOW (ref 3.87–5.11)
RDW: 18.5 % — AB (ref 11.5–15.5)
WBC: 17.2 10*3/uL — ABNORMAL HIGH (ref 4.0–10.5)

## 2013-10-13 LAB — MAGNESIUM: MAGNESIUM: 1.7 mg/dL (ref 1.5–2.5)

## 2013-10-13 LAB — PREALBUMIN: PREALBUMIN: 3.1 mg/dL — AB (ref 17.0–34.0)

## 2013-10-13 LAB — TRIGLYCERIDES: TRIGLYCERIDES: 146 mg/dL (ref <150)

## 2013-10-13 MED ORDER — INSULIN ASPART 100 UNIT/ML ~~LOC~~ SOLN
0.0000 [IU] | Freq: Four times a day (QID) | SUBCUTANEOUS | Status: DC
  Administered 2013-10-13 – 2013-10-18 (×13): 1 [IU] via SUBCUTANEOUS
  Administered 2013-10-18: 4 [IU] via SUBCUTANEOUS
  Administered 2013-10-18 – 2013-10-20 (×5): 1 [IU] via SUBCUTANEOUS
  Administered 2013-10-20: 2 [IU] via SUBCUTANEOUS
  Administered 2013-10-20 – 2013-10-22 (×3): 1 [IU] via SUBCUTANEOUS

## 2013-10-13 MED ORDER — TRACE MINERALS CR-CU-F-FE-I-MN-MO-SE-ZN IV SOLN
INTRAVENOUS | Status: AC
  Administered 2013-10-13: 17:00:00 via INTRAVENOUS
  Filled 2013-10-13: qty 2000

## 2013-10-13 MED ORDER — FAT EMULSION 20 % IV EMUL
250.0000 mL | INTRAVENOUS | Status: AC
  Administered 2013-10-13: 250 mL via INTRAVENOUS
  Filled 2013-10-13 (×2): qty 250

## 2013-10-13 MED ORDER — MAGNESIUM SULFATE 40 MG/ML IJ SOLN
2.0000 g | Freq: Once | INTRAMUSCULAR | Status: AC
  Administered 2013-10-13: 2 g via INTRAVENOUS
  Filled 2013-10-13: qty 50

## 2013-10-13 NOTE — Evaluation (Signed)
Physical Therapy Evaluation Patient Details Name: GINNIE MARICH MRN: [id number] DOB: 1970-07-16 Today's Date: 10/13/2013   History of Present Illness  43 y.o. 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 Pt s/p subtotal gastrectomy with porta hepatis and peripancreatic lymph node dissection with a Roux-en-Y gastrojejunostomy on 10/10/13.   Clinical Impression  Pt admitted with above. Pt currently with functional limitations due to the deficits listed below (see PT Problem List).  Pt will benefit from skilled PT to increase their independence and safety with mobility to allow discharge to the venue listed below.  Pt assisted with very short distance ambulation, limited by fatigue and pain.  Pt plans to d/c home with mother to assist however concerned about flight of stairs into home.      Follow Up Recommendations Home health PT;Supervision for mobility/OOB (pending progress)    Equipment Recommendations  Rolling walker with 5" wheels    Recommendations for Other Services       Precautions / Restrictions Precautions Precautions: Fall Precaution Comments: NG tube, R drain      Mobility  Bed Mobility Overal bed mobility: Needs Assistance Bed Mobility: Supine to Sit     Supine to sit: Min assist;HOB elevated     General bed mobility comments: assist for trunk upright  Transfers Overall transfer level: Needs assistance Equipment used: 1 person hand held assist Transfers: Sit to/from Stand Sit to Stand: Min assist         General transfer comment: verbal cues for safe technique  Ambulation/Gait Ambulation/Gait assistance: Min assist Ambulation Distance (Feet): 10 Feet Assistive device:  (bil UE support on IV pole) Gait Pattern/deviations: Step-through pattern;Decreased stride length;Narrow base of support Gait velocity: decr    General Gait Details: very short steps, increased time, pt reports fatigue and pain L ribs  Stairs            Wheelchair Mobility    Modified Rankin (Stroke Patients Only)       Balance                                             Pertinent Vitals/Pain Max c/o L rib pain, repositioned, RN notified and to bring meds SpO2 room air 95% during ambulation HR 130 during ambulation    Home Living Family/patient expects to be discharged to:: Private residence Living Arrangements: Parent Available Help at Discharge: Family Type of Home: Apartment Home Access: Stairs to enter Entrance Stairs-Rails: Right Entrance Stairs-Number of Steps: 15 Home Layout: One level Home Equipment: None      Prior Function Level of Independence: Independent               Hand Dominance        Extremity/Trunk Assessment               Lower Extremity Assessment: Generalized weakness         Communication   Communication: No difficulties  Cognition Arousal/Alertness: Awake/alert Behavior During Therapy: WFL for tasks assessed/performed Overall Cognitive Status: Within Functional Limits for tasks assessed                      General Comments      Exercises        Assessment/Plan    PT Assessment Patient needs continued  PT services  PT Diagnosis Difficulty walking;Acute pain   PT Problem List Decreased strength;Decreased activity tolerance;Decreased mobility;Pain;Decreased knowledge of use of DME  PT Treatment Interventions Gait training;DME instruction;Patient/family education;Functional mobility training;Therapeutic activities;Therapeutic exercise;Stair training   PT Goals (Current goals can be found in the Care Plan section) Acute Rehab PT Goals PT Goal Formulation: With patient Time For Goal Achievement: 10/20/13 Potential to Achieve Goals: Good    Frequency Min 3X/week   Barriers to discharge        Co-evaluation                End of Session   Activity Tolerance: Patient limited by pain Patient left: with call bell/phone within reach;in chair (left in w/c (RN brought w/c) for pt to get ready to change floors) Nurse Communication: Patient requests pain meds         Time: 3545-6256 PT Time Calculation (min): 23 min   Charges:   PT Evaluation $Initial PT Evaluation Tier I: 1 Procedure PT Treatments $Gait Training: 8-22 mins   PT G Codes:          Laverda Stribling,KATHrine E 10/13/2013, 3:50 PM Carmelia Bake, PT, DPT 10/13/2013 Pager: [phone]

## 2013-10-13 NOTE — Progress Notes (Signed)
Received report from [place], RN from ICU. Will continue to monitor patient.

## 2013-10-13 NOTE — Progress Notes (Signed)
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 Azithromycin Rash  . Sulfa Antibiotics Rash  . Hydrocodone-Acetaminophen Nausea And Vomiting    Hallucinating   . 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 SHE DOES NOT WANT ANY BLOOD PRODUCTS OR FRACTIONS   Patient Measurements: Height: 5\' 6"  (167.6 cm) Weight: 161 lb 13.1 oz (73.4 kg) IBW/kg (Calculated) : 59.3  Vital Signs: Temp: 98.4 F (36.9 C) (07/20 0400) Temp src: Oral (07/20 0400) BP: 113/69 mmHg (07/20 0700) Pulse Rate: 111 (07/20 0700) Intake/Output from previous day: 07/19 0701 - 07/20 0700 In: 3140 [I.V.:970; NG/GT:180; IV Piggyback:100; TPN:1890] Out: 3835 [Urine:3625; Emesis/NG output:200; Drains:10] Intake/Output from this shift: Total I/O In: 30 [NG/GT:30] Out: -   Labs:  Recent Labs  10/11/13 0420 10/12/13 0440 10/13/13 0540  WBC 22.4* 18.9* 17.2*  HGB 9.0* 8.0* 7.7*  HCT 29.0* 26.3* 25.1*  PLT 264 194 161    Recent Labs  10/11/13 0420 10/12/13 0433 10/13/13 0540  NA 133* 131* 134*  K 4.2 4.1 4.0  CL 99 99 100  CO2 25 27 26   GLUCOSE 134* 136* 127*  BUN 8 6 6   CREATININE 0.51 0.51 0.44*  CALCIUM 8.2* 8.1* 8.4  MG 1.4* 1.8 1.7  PHOS 4.8* 2.3 2.9  PROT 6.4  --  6.2  ALBUMIN 2.0*  --  1.6*  AST 33  --  22  ALT 21  --  12  ALKPHOS 42  --  67  BILITOT 0.2*  --  0.2*  TRIG  --   --  146   Estimated Creatinine Clearance: 95.8 ml/min (by C-G formula based on Cr of 0.44).    Recent Labs  10/12/13 2311 10/13/13 0419 10/13/13 0757  GLUCAP 120* 123* 128*   Medications:  Scheduled:  . antiseptic oral rinse  15 mL Mouth Rinse q12n4p  . chlorhexidine  15 mL Mouth Rinse BID  . insulin aspart  0-9 Units Subcutaneous 6 times per day  . levofloxacin (LEVAQUIN) IV  500 mg Intravenous Q24H  . ondansetron (ZOFRAN) IV  4 mg Intravenous 4 times per day  . pantoprazole (PROTONIX) IV  40 mg Intravenous QHS   . sodium chloride  10-40 mL Intracatheter Q12H   Infusions:  . sodium chloride 30 mL (10/13/13 0200)  . Marland KitchenTPN (CLINIMIX-E) Adult 80 mL/hr at 10/12/13 1707   And  . fat emulsion 250 mL (10/12/13 1707)   Insulin Requirements in the past 24 hours:  3 units SSI/24h, CBGs controlled Novolog sensitive scale q4hr  Current Nutrition, IV fluids:  NPO NS at 30 ml/hr  Assessment: 63 YOF presents with N/V, weight loss d/t difficulty eating with recent dx of Gastric cancer. EGD on 09/22/2013 reveald findings of a partially obstructing oozing cratered gastric ulcer in the gastric antrum. 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 TPN started 7/16 for GOO. Surgery consulted, s/p subtotal gastrectomy with gastric jejunostomy on 7/17.    Electrolytes: Phos now WNL, Corrected Ca 10.3, Mg 1.7 (repalced 7/18), Na improved to 131  Renal: SCr WNL, I/O = -664ml, UOP 2.1 ml/kg/hr, NGT = 250ml out, drain 59ml out  LFT's: WNL  Glucose control: at goal of  150mg /dl, no hypoglycemia  Triglycerides: 94 (7/17), 146 (7/20) - increased, will continue to monitor   Pre-Albumin: 7.9 (7/17) - reflects depleted protein stores  Access: PICC placed 7/16 Day #5 TNA  Nutritional Goals:  RD consulted: Kcal: 2000-2200  Protein: 85-100 gram  Fluid: >/=2100 ml/daily  Clinimix 5/15 at 80 ml/hr + Lipids 20% at 10 ml/hr would deliver: 1850 kCal per day 96 grams of protein per day  Plan: at 1800 today  Continue Clinimix E 5/15 at goal rate 80 ml/hr  Hyponatremia - likely from free fluid in TPN - unable to change adjust electrolytes with clinimix product  Continue Lipids 20% at 10 ml/hr  Reduce MIVF to 62ml/hr at 6pm for incTPN rate  Full TNA labs Monday/Thursday  Replace Mg 2g IV x 1, recheck Mg level and CMET tomorrow   MVI and Trace elements daily  Reduce SSI to q6h   Ralene Bathe, PharmD, BCPS 10/13/2013, 9:22 AM  Pager: [phone]

## 2013-10-13 NOTE — Progress Notes (Signed)
Seen and agree  

## 2013-10-13 NOTE — Progress Notes (Addendum)
Patient ID: Andrea Best, female   DOB: 06/01/1970, 43 y.o.   MRN: [id number]  [facility] PROGRESS NOTE  Andrea Best PPJ:[id number] DOB: 1970-09-02 DOA: 10/08/2013 PCP: Conni Slipper, MD  Brief Narrative:   43 y.o. 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 She underwent subtotal gastrectomy with porta hepatis and peripancreatic lymph node dissection with a Roux-en-Y gastrojejunostomy on 10/10/13.   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 In SDU post-op.  Dr. Benay Spice following for consideration of adjuvant treatment options. Pt stable for transfer to the floor Active Problems:  Postoperative Fever/tachycardia  Spiked a fever to 102.44F overnight and tachycardic to the 141 (likely multifactorial with fever and anemia contributory, ? PNA) CXR suggestive of developing PNA Pt started on Levaquin, continue day #2 PNA  Continue Levaquin day #2  Sputum analysis requested  Hypomagnesemia / hypophosphatemia  Repleted in TNA. 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  Iron therapy ordered per pharmacy dosing.  Hemoglobin dropped overnight, but otherwise stable at 7.7 Protein-calorie malnutrition, severe  Pt meets criteria for severe MALNUTRITION in the context of chronic illness as evidenced by PO intake < 75% for > one month, 16% body weight loss in 4 months. PICC line placed for TNA administration. Lupus/Sjogren syndrome  Plaquenil and prednisone  currently on hold. DVT Prophylaxis  Hold heparin or Lovenox due to drop inHg   Code Status: Full.  Family Communication: Mother updated at the bedside 10/09/13, no family currently at the bedside.  Disposition Plan: Transfer to telemetry bed   IV Access:   PICC placed 10/09/13. Procedures and diagnostic studies:   Chest x-ray 10/08/13: No active disease.  Subtotal gastrectomy with porta hepatis and peripancreatic lymph node dissection, Roux-en-Y gastrojejunostomy 10/10/13 by Dr. Excell Seltzer.  Chest x-ray 10/12/13: ? Developing PNA Medical Consultants:   Dr. Betsy Coder, Oncology.  Dr. Excell Seltzer, Surgery. Other Consultants:   Dietitian  Pharmacy Anti-Infectives:   Levaquin 7/19 -->  HPI/Subjective: No events overnight.   Objective: Filed Vitals:   10/13/13 1100 10/13/13 1200 10/13/13 1300 10/13/13 1400  BP: 123/72 119/66 117/49   Pulse: 118 124 129 129  Temp:  98.7 F (37.1 C)    TempSrc:  Oral    Resp: 21 18 16 23   Height:      Weight:      SpO2: 98% 96% 97% 98%    Intake/Output Summary (Last 24 hours) at 10/13/13 1429 Last data filed at 10/13/13 1400  Gross per 24 hour  Intake   3190 ml  Output   4655 ml  Net  -1465 ml    Exam:   General:  Pt is alert, follows commands appropriately, not in acute distress  Cardiovascular: Regular rhythm, tachycardic, S1/S2, no murmurs, no rubs, no gallops  Respiratory: Clear to auscultation bilaterally, no wheezing, diminished breath sounds at base   Abdomen: Soft, tender in epigastric area, mildly distended, bowel sounds present, no guarding, NGT in place   Data Reviewed: Basic Metabolic Panel:  Recent Labs Lab 10/09/13 0427 10/09/13 0433 10/10/13 0550 10/11/13 0420 10/12/13 0433 10/13/13 0540  NA  139  --  140 133* 131* 134*  K 3.6*  --  3.9 4.2 4.1 4.0  CL 105  --  106 99 99 100  CO2 24  --  25 25 27 26   GLUCOSE 87  --  90 134* 136* 127*  BUN 8  --  4* 8 6 6   CREATININE 0.58  --  0.55 0.51 0.51  0.44*  CALCIUM 8.6  --  8.6 8.2* 8.1* 8.4  MG  --  1.9 1.8 1.4* 1.8 1.7  PHOS  --  4.8* 5.1* 4.8* 2.3 2.9   Liver Function Tests:  Recent Labs Lab 10/08/13 2058 10/09/13 0427 10/10/13 0550 10/11/13 0420 10/13/13 0540  AST 34 32 27 33 22  ALT 25 22 19 21 12   ALKPHOS 50 45 43 42 67  BILITOT 0.4 0.3 0.2* 0.2* 0.2*  PROT 8.3 7.2 6.8 6.4 6.2  ALBUMIN 2.9* 2.5* 2.3* 2.0* 1.6*   CBC:  Recent Labs Lab 10/09/13 0427 10/10/13 0550 10/10/13 2158 10/11/13 0420 10/12/13 0440 10/13/13 0540  WBC 3.8* 3.7*  --  22.4* 18.9* 17.2*  NEUTROABS  --  1.5*  --   --   --  14.5*  HGB 9.2* 9.0* 9.4* 9.0* 8.0* 7.7*  HCT 30.4* 30.0* 31.2* 29.0* 26.3* 25.1*  MCV 72.0* 73.0*  --  73.2* 73.3* 72.8*  PLT 331 338  --  264 194 161   CBG:  Recent Labs Lab 10/12/13 2006 10/12/13 2311 10/13/13 0419 10/13/13 0757 10/13/13 1210  GLUCAP 125* 120* 123* 128* 134*    Recent Results (from the past 240 hour(s))  MRSA PCR SCREENING     Status: None   Collection Time    10/10/13  3:39 PM      Result Value Ref Range Status   MRSA by PCR NEGATIVE  NEGATIVE Final   Comment:            The GeneXpert MRSA Assay (FDA     approved for NASAL specimens     only), is one component of a     comprehensive MRSA colonization     surveillance program. It is not     intended to diagnose MRSA     infection nor to guide or     monitor treatment for     MRSA infections.     Scheduled Meds: . antiseptic oral rinse  15 mL Mouth Rinse q12n4p  . chlorhexidine  15 mL Mouth Rinse BID  . insulin aspart  0-9 Units Subcutaneous 4 times per day  . levofloxacin (LEVAQUIN) IV  500 mg Intravenous Q24H  . ondansetron (ZOFRAN) IV  4 mg Intravenous 4 times per day  . pantoprazole (PROTONIX) IV  40 mg Intravenous QHS  . sodium chloride  10-40 mL Intracatheter Q12H   Continuous Infusions: . sodium chloride 30 mL (10/13/13 0200)  . Marland KitchenTPN (CLINIMIX-E) Adult 80 mL/hr at 10/12/13 1707   And  . fat emulsion 250 mL (10/12/13  1707)  . Marland KitchenTPN (CLINIMIX-E) Adult     And  . fat emulsion     Faye Ramsay, MD  [facility] Pager [phone]  If 7PM-7AM, please contact night-coverage [email or web address] Password [email or web address] 10/13/2013, 2:29 PM   LOS: 5 days

## 2013-10-13 NOTE — Progress Notes (Signed)
[facility] Progress Note  3 Days Post-Op  Subjective: Pt doing okay, no N/V, abdominal pain well managed.  No bowel function yet.  No flatus or BM.  Ambulating OOB to the bathroom and sat in the chair yesterday.  Objective: Vital signs in last 24 hours: Temp:  [97.7 F (36.5 C)-101.3 F (38.5 C)] 98.4 F (36.9 C) (07/20 0400) Pulse Rate:  [25-136] 111 (07/20 0700) Resp:  [9-22] 20 (07/20 0700) BP: (82-127)/(44-69) 113/69 mmHg (07/20 0700) SpO2:  [91 %-100 %] 97 % (07/20 0700) Weight:  [161 lb 13.1 oz (73.4 kg)] 161 lb 13.1 oz (73.4 kg) (07/20 0400) Last BM Date: 09/30/13  Intake/Output from previous day: 07/19 0701 - 07/20 0700 In: 3140 [I.V.:970; NG/GT:180; IV Piggyback:100; TPN:1890] Out: 3835 [Urine:3625; Emesis/NG output:200; Drains:10] Intake/Output this shift:    PE: Gen:  Alert, NAD, pleasant Abd: Soft, mild distension, tender in the upper abdomen and midline, +BS, no HSM, midline incision staples in place with honeycomb dressing overtop with sanguinous drainage, drain with minimal brown clear drainage   Lab Results:   Recent Labs  10/12/13 0440 10/13/13 0540  WBC 18.9* 17.2*  HGB 8.0* 7.7*  HCT 26.3* 25.1*  PLT 194 161   BMET  Recent Labs  10/12/13 0433 10/13/13 0540  NA 131* 134*  K 4.1 4.0  CL 99 100  CO2 27 26  GLUCOSE 136* 127*  BUN 6 6  CREATININE 0.51 0.44*  CALCIUM 8.1* 8.4   PT/INR No results found for this basename: LABPROT, INR,  in the last 72 hours CMP     Component Value Date/Time   NA 134* 10/13/2013 0540   K 4.0 10/13/2013 0540   CL 100 10/13/2013 0540   CO2 26 10/13/2013 0540   GLUCOSE 127* 10/13/2013 0540   BUN 6 10/13/2013 0540   CREATININE 0.44* 10/13/2013 0540   CREATININE 0.63 05/26/2013 1517   CALCIUM 8.4 10/13/2013 0540   PROT 6.2 10/13/2013 0540   ALBUMIN 1.6* 10/13/2013 0540   AST 22 10/13/2013 0540   ALT 12 10/13/2013 0540   ALKPHOS 67 10/13/2013 0540   BILITOT 0.2* 10/13/2013 0540   GFRNONAA >90 10/13/2013  0540   GFRAA >90 10/13/2013 0540   Lipase  No results found for this basename: lipase       Studies/Results: Dg Chest Port 1 View  10/12/2013   CLINICAL DATA:  Fever and cough.  Asthma.  EXAM: PORTABLE CHEST - 1 VIEW  COMPARISON:  10/08/2013  FINDINGS: Nasogastric tube is seen with tip in the mid stomach. A right arm PICC line is seen with tip overlying the proximal to mid right atrium. Low lung volumes are seen with bibasilar opacity which may be due to atelectasis or pneumonia. No evidence of pleural effusion.  IMPRESSION: Low lung volumes with bibasilar atelectasis or pneumonia.   Electronically Signed   By: Earle Gell M.D.   On: 10/12/2013 09:30    Anti-infectives: Anti-infectives   Start     Dose/Rate Route Frequency Ordered Stop   10/12/13 1600  levofloxacin (LEVAQUIN) IVPB 500 mg     500 mg 100 mL/hr over 60 Minutes Intravenous Every 24 hours 10/12/13 1545     10/10/13 1230  ceFAZolin (ANCEF) IVPB 2 g/50 mL premix     2 g 100 mL/hr over 30 Minutes Intravenous  Once 10/10/13 1220 10/10/13 1222   10/10/13 0600  [MAR Hold]  ceFAZolin (ANCEF) IVPB 2 g/50 mL premix     (On MAR Hold since  10/10/13 0659)   2 g 100 mL/hr over 30 Minutes Intravenous On call to O.R. 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) POD #3 s/p subtotal gastrectomy with Roux-en-Y gastrojejunostomy & porta hepatis and peripancreatic lymph node dissection Intractable N/V - resolved Microcytic anemia/blood loss anemia Leukocytosis - down to 17.2 ABL Anemia - Hgb 7.7 PCM on TPN Urinary retention - foley back in  Plan: -Afebrile, WBC trending down to 17.2, mild tachycardia -On Levoquin for possible PNA -Encourage incentive spirometer, SCD's, and would not start heparin or lovenox yet given drop in Hgb/Hct -Continue TPN, NPO, IVF, pain control, antiemetics -Await bowel function prior to advancing diet, may need UGI study prior to taking out  NG tube -Okay from surgery perspective to transfer out of unit to floor -Ordered PT -Keep foley for now -Hgb down 7.7, but seems to be stabilizing, pt does not desire blood because she is a Jehovah's witness     LOS: 5 days    DORT, Martiza Speth 10/13/2013, 7:55 AM Pager: [phone]

## 2013-10-14 ENCOUNTER — Inpatient Hospital Stay (HOSPITAL_COMMUNITY): Payer: 59

## 2013-10-14 ENCOUNTER — Encounter (HOSPITAL_COMMUNITY): Payer: Self-pay | Admitting: General Surgery

## 2013-10-14 LAB — CBC
HCT: 25.9 % — ABNORMAL LOW (ref 36.0–46.0)
HEMOGLOBIN: 8 g/dL — AB (ref 12.0–15.0)
MCH: 22.7 pg — ABNORMAL LOW (ref 26.0–34.0)
MCHC: 30.9 g/dL (ref 30.0–36.0)
MCV: 73.4 fL — ABNORMAL LOW (ref 78.0–100.0)
Platelets: 212 10*3/uL (ref 150–400)
RBC: 3.53 MIL/uL — ABNORMAL LOW (ref 3.87–5.11)
RDW: 18.7 % — ABNORMAL HIGH (ref 11.5–15.5)
WBC: 18.4 10*3/uL — AB (ref 4.0–10.5)

## 2013-10-14 LAB — URINALYSIS, ROUTINE W REFLEX MICROSCOPIC
Bilirubin Urine: NEGATIVE
Glucose, UA: NEGATIVE mg/dL
Hgb urine dipstick: NEGATIVE
KETONES UR: NEGATIVE mg/dL
LEUKOCYTES UA: NEGATIVE
NITRITE: NEGATIVE
PH: 7.5 (ref 5.0–8.0)
Protein, ur: 30 mg/dL — AB
Specific Gravity, Urine: 1.013 (ref 1.005–1.030)
Urobilinogen, UA: 0.2 mg/dL (ref 0.0–1.0)

## 2013-10-14 LAB — GLUCOSE, CAPILLARY
GLUCOSE-CAPILLARY: 121 mg/dL — AB (ref 70–99)
GLUCOSE-CAPILLARY: 136 mg/dL — AB (ref 70–99)
Glucose-Capillary: 132 mg/dL — ABNORMAL HIGH (ref 70–99)
Glucose-Capillary: 136 mg/dL — ABNORMAL HIGH (ref 70–99)
Glucose-Capillary: 141 mg/dL — ABNORMAL HIGH (ref 70–99)

## 2013-10-14 LAB — COMPREHENSIVE METABOLIC PANEL
ALT: 10 U/L (ref 0–35)
AST: 15 U/L (ref 0–37)
Albumin: 1.6 g/dL — ABNORMAL LOW (ref 3.5–5.2)
Alkaline Phosphatase: 85 U/L (ref 39–117)
Anion gap: 9 (ref 5–15)
BUN: 8 mg/dL (ref 6–23)
CHLORIDE: 96 meq/L (ref 96–112)
CO2: 25 meq/L (ref 19–32)
Calcium: 8.5 mg/dL (ref 8.4–10.5)
Creatinine, Ser: 0.47 mg/dL — ABNORMAL LOW (ref 0.50–1.10)
GFR calc Af Amer: >90 mL/min (ref >90)
Glucose, Bld: 140 mg/dL — ABNORMAL HIGH (ref 70–99)
POTASSIUM: 3.7 meq/L (ref 3.7–5.3)
SODIUM: 130 meq/L — AB (ref 137–147)
Total Bilirubin: <0.2 mg/dL — ABNORMAL LOW (ref 0.3–1.2)
Total Protein: 6.8 g/dL (ref 6.0–8.3)

## 2013-10-14 LAB — EXPECTORATED SPUTUM ASSESSMENT W GRAM STAIN, RFLX TO RESP C

## 2013-10-14 LAB — MAGNESIUM: Magnesium: 1.8 mg/dL (ref 1.5–2.5)

## 2013-10-14 LAB — STREP PNEUMONIAE URINARY ANTIGEN: Strep Pneumo Urinary Antigen: NEGATIVE

## 2013-10-14 LAB — URINE MICROSCOPIC-ADD ON

## 2013-10-14 LAB — EXPECTORATED SPUTUM ASSESSMENT W REFEX TO RESP CULTURE

## 2013-10-14 MED ORDER — TRACE MINERALS CR-CU-F-FE-I-MN-MO-SE-ZN IV SOLN
INTRAVENOUS | Status: AC
  Administered 2013-10-14: 17:00:00 via INTRAVENOUS
  Filled 2013-10-14: qty 2000

## 2013-10-14 MED ORDER — VANCOMYCIN HCL IN DEXTROSE 750-5 MG/150ML-% IV SOLN
750.0000 mg | Freq: Three times a day (TID) | INTRAVENOUS | Status: DC
  Administered 2013-10-14 – 2013-10-15 (×4): 750 mg via INTRAVENOUS
  Filled 2013-10-14 (×5): qty 150

## 2013-10-14 MED ORDER — DEXTROSE 5 % IV SOLN
1.0000 g | Freq: Three times a day (TID) | INTRAVENOUS | Status: DC
  Administered 2013-10-14 – 2013-10-20 (×19): 1 g via INTRAVENOUS
  Filled 2013-10-14 (×20): qty 1

## 2013-10-14 MED ORDER — FAT EMULSION 20 % IV EMUL
250.0000 mL | INTRAVENOUS | Status: AC
  Administered 2013-10-14: 250 mL via INTRAVENOUS
  Filled 2013-10-14: qty 250

## 2013-10-14 MED ORDER — SODIUM CHLORIDE 0.9 % IV SOLN
1500.0000 mg | Freq: Once | INTRAVENOUS | Status: AC
  Administered 2013-10-14: 1500 mg via INTRAVENOUS
  Filled 2013-10-14: qty 1500

## 2013-10-14 NOTE — Progress Notes (Addendum)
Patient ID: Andrea Best, female   DOB: [birth year], 43 y.o.   MRN: [id number]  [facility] PROGRESS NOTE  Andrea Best QPY:[id number] DOB: September 03, 1970 DOA: 10/08/2013 PCP: Conni Slipper, MD  Brief Narrative:   43 y.o. 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 She underwent subtotal gastrectomy with porta hepatis and peripancreatic lymph node dissection with a Roux-en-Y gastrojejunostomy on 10/10/13.   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 In SDU post-op.  Dr. Benay Spice following for consideration of adjuvant treatment options.  Pt stable for transfer to the floor Appreciate surgery input Continue TPN, n.p.o., pain control and antiemetics as needed Active Problems:  Postoperative Fever/tachycardia  Patient remains febrile with MAXIMUM TEMPERATURE 100.1 Fahrenheit CXR suggestive of developing PNA  Pt started on Levaquin, today is day #3 Due to worsening leukocytosis and persistent fever will broaden the spectrum to cover for HCAP Repeat chest x-ray, also check urinalysis and urine culture PNA  Patient initially started on Levaquin and today is day 3  will broaden the spectrum as noted above to vancomycin and Maxipime Hypomagnesemia / hypophosphatemia  Repleted in TNA. 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  Iron therapy ordered per pharmacy dosing.  Hemoglobin remained stable over the past 24 hours Protein-calorie malnutrition, severe  Pt  meets criteria for severe MALNUTRITION in the context of chronic illness as evidenced by PO intake < 75% for > one month, 16% body weight loss in 4 months.  PICC line placed for TNA administration. Lupus/Sjogren syndrome  Plaquenil and prednisone currently on hold. DVT Prophylaxis  Hold heparin or Lovenox due to drop inHg   Code Status: Full.  Family Communication: Mother updated at the bedside 10/09/13, no family currently at the bedside.  Disposition Plan: Transfer to telemetry bed   IV Access:   PICC placed 10/09/13. Procedures and diagnostic studies:   Chest x-ray 10/08/13: No active disease.  Subtotal gastrectomy with porta hepatis and peripancreatic lymph node dissection, Roux-en-Y gastrojejunostomy 10/10/13 by Dr. Excell Seltzer.  Chest x-ray 10/12/13: ? Developing PNA Medical Consultants:   Dr. Betsy Coder, Oncology.  Dr. Excell Seltzer, Surgery. Other Consultants:   Dietitian  Pharmacy Anti-Infectives:   Levaquin 7/19 --> 7/21 Maxipime 7/21 --> Vancomycin 7/21 -->   [name], MD  [facility] Pager [phone]  If 7PM-7AM, please contact night-coverage [email or web address] Password [facility] 10/14/2013, 10:23 AM   LOS: 6 days   HPI/Subjective: No acute overnight events. NG tube with 617mL bilious output. No flatus or BM yet.  Objective: Filed Vitals:   10/13/13 2041 10/14/13 0217 10/14/13 0541 10/14/13 0816  BP: 114/59 112/61 117/63 115/61  Pulse: 128 133 136 130  Temp: 98.5 F (36.9 C) 99 F (37.2 C) 99.4 F (37.4 C) 100.1 F (37.8 C)  TempSrc: Oral Oral Oral Oral  Resp: 16 16 18 18   Height:      Weight:      SpO2: 97% 96% 95% 94%    Intake/Output Summary (Last 24 hours) at 10/14/13 1023 Last data filed at 10/14/13 0700  Gross per 24 hour  Intake   2670 ml  Output   2785 ml  Net   -115 ml    Exam:   General:  Pt is alert, follows commands appropriately, not in acute distress  Cardiovascular: Regular rhythm, tachycardic, S1/S2, no  murmurs  Respiratory: Diminished breath sounds at bases, bibasilar rales  Abdomen: Soft, non tender, non distended, bowel sounds present, 600 mL of bilious output, NGT in place   Extremities: No edema, pulses DP and PT palpable bilaterally  Neuro: Grossly nonfocal  Data Reviewed: Basic Metabolic Panel:  Recent Labs Lab 10/09/13 0433 10/10/13 0550 10/11/13 0420 10/12/13 0433 10/13/13 0540 10/14/13 0450  NA  --  140 133* 131* 134* 130*  K  --  3.9 4.2 4.1 4.0 3.7  CL  --  106 99 99 100 96  CO2  --  25 25 27 26 25   GLUCOSE  --  90 134* 136* 127* 140*  BUN  --  4* 8 6 6 8   CREATININE  --  0.55 0.51 0.51 0.44* 0.47*  CALCIUM  --  8.6 8.2* 8.1* 8.4 8.5  MG 1.9 1.8 1.4* 1.8 1.7 1.8  PHOS 4.8* 5.1* 4.8* 2.3 2.9  --    Liver Function Tests:  Recent Labs Lab 10/09/13 0427 10/10/13 0550 10/11/13 0420 10/13/13 0540 10/14/13 0450  AST 32 27 33 22 15  ALT 22 19 21 12 10   ALKPHOS 45 43 42 67 85  BILITOT 0.3 0.2* 0.2* 0.2* <0.2*  PROT 7.2 6.8 6.4 6.2 6.8  ALBUMIN 2.5* 2.3* 2.0* 1.6* 1.6*  CBC:  Recent Labs Lab 10/10/13 0550 10/10/13 2158 10/11/13 0420 10/12/13 0440 10/13/13 0540 10/14/13 0450  WBC 3.7*  --  22.4* 18.9* 17.2* 18.4*  NEUTROABS 1.5*  --   --   --  14.5*  --   HGB 9.0* 9.4* 9.0* 8.0* 7.7* 8.0*  HCT 30.0* 31.2* 29.0* 26.3* 25.1* 25.9*  MCV 73.0*  --  73.2* 73.3* 72.8* 73.4*  PLT 338  --  264 194 161 212   CBG:  Recent Labs Lab 10/13/13 0757 10/13/13 1210 10/13/13 1803 10/14/13 0020 10/14/13 0619  GLUCAP 128* 134* 120* 132* 121*    Recent Results (from the past 240 hour(s))  MRSA PCR SCREENING     Status: None   Collection Time    10/10/13  3:39 PM      Result Value Ref Range Status   MRSA by PCR NEGATIVE  NEGATIVE Final   Comment:            The GeneXpert MRSA Assay (FDA     approved for NASAL specimens     only), is one component of a     comprehensive MRSA colonization     surveillance program. It is not     intended to diagnose  MRSA     infection nor to guide or     monitor treatment for     MRSA infections.     Studies: No results found.  Scheduled Meds: . antiseptic oral rinse  15 mL Mouth Rinse q12n4p  . chlorhexidine  15 mL Mouth Rinse BID  . insulin aspart  0-9 Units Subcutaneous 4 times per day  . levofloxacin (LEVAQUIN) IV  500 mg Intravenous Q24H  . ondansetron (ZOFRAN) IV  4 mg Intravenous 4 times per day  . pantoprazole (PROTONIX) IV  40 mg Intravenous QHS  . sodium chloride  10-40 mL Intracatheter Q12H   Continuous Infusions: . sodium chloride 30 mL (10/13/13 0200)  . Marland KitchenTPN (CLINIMIX-E) Adult 80  mL/hr at 10/13/13 1725   And  . fat emulsion 250 mL (10/13/13 1725)  . Marland KitchenTPN (CLINIMIX-E) Adult     And  . fat emulsion

## 2013-10-14 NOTE — Progress Notes (Signed)
[facility] Progress Note  4 Days Post-Op  Subjective: Pt states she's doing okay.  No N/V, abdominal pain with movement.  Not hungry.  Worried about foley coming out, doesn't feels she's strong enough to get up.  Sat in chair yesterday, working with PT/OT.  Using IS but can only get between 500-750.  NG tube with 675mL bilious output.  No flatus or BM yet.  Objective: Vital signs in last 24 hours: Temp:  [98.5 F (36.9 C)-99.4 F (37.4 C)] 99.4 F (37.4 C) (07/21 0541) Pulse Rate:  [110-136] 136 (07/21 0541) Resp:  [15-23] 18 (07/21 0541) BP: (107-123)/(49-72) 117/63 mmHg (07/21 0541) SpO2:  [95 %-98 %] 95 % (07/21 0541) Last BM Date: 09/30/13  Intake/Output from previous day: 07/20 0701 - 07/21 0700 In: 3060 [I.V.:720; NG/GT:210; IV Piggyback:150; TPN:1980] Out: 2785 [Urine:2175; Emesis/NG output:600; Drains:10] Intake/Output this shift:    PE: Gen:  Alert, NAD, pleasant Abd: Soft, mildly distended, tender throughout and most over midline incision, +BS, no HSM, incisions C/D/I, drain with brown clear drainage (recorded as 10mL/24hr output) NG tube with 656mL bilious output   Lab Results:   Recent Labs  10/13/13 0540 10/14/13 0450  WBC 17.2* 18.4*  HGB 7.7* 8.0*  HCT 25.1* 25.9*  PLT 161 212   BMET  Recent Labs  10/13/13 0540 10/14/13 0450  NA 134* 130*  K 4.0 3.7  CL 100 96  CO2 26 25  GLUCOSE 127* 140*  BUN 6 8  CREATININE 0.44* 0.47*  CALCIUM 8.4 8.5   PT/INR No results found for this basename: LABPROT, INR,  in the last 72 hours CMP     Component Value Date/Time   NA 130* 10/14/2013 0450   K 3.7 10/14/2013 0450   CL 96 10/14/2013 0450   CO2 25 10/14/2013 0450   GLUCOSE 140* 10/14/2013 0450   BUN 8 10/14/2013 0450   CREATININE 0.47* 10/14/2013 0450   CREATININE 0.63 05/26/2013 1517   CALCIUM 8.5 10/14/2013 0450   PROT 6.8 10/14/2013 0450   ALBUMIN 1.6* 10/14/2013 0450   AST 15 10/14/2013 0450   ALT 10 10/14/2013 0450   ALKPHOS 85  10/14/2013 0450   BILITOT <0.2* 10/14/2013 0450   GFRNONAA >90 10/14/2013 0450   GFRAA >90 10/14/2013 0450   Lipase  No results found for this basename: lipase       Studies/Results: Dg Chest Port 1 View  10/12/2013   CLINICAL DATA:  Fever and cough.  Asthma.  EXAM: PORTABLE CHEST - 1 VIEW  COMPARISON:  10/08/2013  FINDINGS: Nasogastric tube is seen with tip in the mid stomach. A right arm PICC line is seen with tip overlying the proximal to mid right atrium. Low lung volumes are seen with bibasilar opacity which may be due to atelectasis or pneumonia. No evidence of pleural effusion.  IMPRESSION: Low lung volumes with bibasilar atelectasis or pneumonia.   Electronically Signed   By: Earle Gell M.D.   On: 10/12/2013 09:30    Anti-infectives: Anti-infectives   Start     Dose/Rate Route Frequency Ordered Stop   10/12/13 1600  levofloxacin (LEVAQUIN) IVPB 500 mg     500 mg 100 mL/hr over 60 Minutes Intravenous Every 24 hours 10/12/13 1545     10/10/13 1230  ceFAZolin (ANCEF) IVPB 2 g/50 mL premix     2 g 100 mL/hr over 30 Minutes Intravenous  Once 10/10/13 1220 10/10/13 1222   10/10/13 0600  [MAR Hold]  ceFAZolin (ANCEF) IVPB 2  g/50 mL premix     (On MAR Hold since 10/10/13 0659)   2 g 100 mL/hr over 30 Minutes Intravenous On call to O.R. 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)  POD #4 s/p subtotal gastrectomy with Roux-en-Y gastrojejunostomy & porta hepatis and peripancreatic lymph node dissection  Intractable N/V - resolved  Microcytic anemia/blood loss anemia  Leukocytosis - up to 18.4 today ABL Anemia - Hgb 8.0 PCM on TPN  Urinary retention - foley back in   Plan:  -Afebrile, WBC trending back up to 18.4, tachycardic - ?pulm source since IS is pretty insufficient at >750 -On Levoquin for possible PNA  -Encourage incentive spirometer, SCD's, and would not start heparin or lovenox yet given drop in Hgb/Hct  and patients blood administration preferences -Continue TPN, NPO, IVF, pain control, antiemetics  -Await bowel function prior to advancing diet, may need UGI study prior to taking out NG tube  -PT following -May need to try to d/c foley today if suspect urinary source of leukocytosis -Hgb up to 8.0, seems to be stabilizing, pt does not desire blood because she is a Jehovah's witness      LOS: 6 days    DORT, Noor Vidales 10/14/2013, 8:14 AM Pager: [phone]

## 2013-10-14 NOTE — Progress Notes (Signed)
ANTIBIOTIC CONSULT NOTE - INITIAL  Pharmacy Consult for vancomcyin Indication: HCAP  Allergies  Allergen Reactions  . Azithromycin Rash  . Sulfa Antibiotics Rash  . Hydrocodone-Acetaminophen Nausea And Vomiting    Hallucinating   . 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 SHE DOES NOT WANT ANY BLOOD PRODUCTS OR FRACTIONS    Patient Measurements: Height: 5\' 6"  (167.6 cm) Weight: 161 lb 13.1 oz (73.4 kg) IBW/kg (Calculated) : 59.3 Adjusted Body Weight:   Vital Signs: Temp: 100.1 F (37.8 C) (07/21 0816) Temp src: Oral (07/21 0816) BP: 115/61 mmHg (07/21 0816) Pulse Rate: 130 (07/21 0816) Intake/Output from previous day: 07/20 0701 - 07/21 0700 In: 3060 [I.V.:720; NG/GT:210; IV Piggyback:150; TPN:1980] Out: 2785 [Urine:2175; Emesis/NG output:600; Drains:10] Intake/Output from this shift:    Labs:  Recent Labs  10/12/13 0433 10/12/13 0440 10/13/13 0540 10/14/13 0450  WBC  --  18.9* 17.2* 18.4*  HGB  --  8.0* 7.7* 8.0*  PLT  --  194 161 212  CREATININE 0.51  --  0.44* 0.47*   Estimated Creatinine Clearance: 95.8 ml/min (by C-G formula based on Cr of 0.47). No results found for this basename: VANCOTROUGH, Corlis Leak, VANCORANDOM, [place], GENTPEAK, GENTRANDOM, TOBRATROUGH, TOBRAPEAK, TOBRARND, AMIKACINPEAK, AMIKACINTROU, AMIKACIN,  in the last 72 hours   Microbiology: Recent Results (from the past 720 hour(s))  MRSA PCR SCREENING     Status: None   Collection Time    10/10/13  3:39 PM      Result Value Ref Range Status   MRSA by PCR NEGATIVE  NEGATIVE Final   Comment:            The GeneXpert MRSA Assay (FDA     approved for NASAL specimens     only), is one component of a     comprehensive MRSA colonization     surveillance program. It is not     intended to diagnose MRSA     infection nor to guide or     monitor treatment for     MRSA infections.    Medical History: Past Medical History  Diagnosis Date  . Asthma   . Eczema   . Lupus   . Sjogren's  disease    Assessment: 74 YOF with Gastric cancer status post upper endoscopy 09/22/2013 with findings of a partially obstructing oozing cratered gastric ulcer in the gastric antrum. 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  Started of levofloxacin for possible pneumonia 7/19, orders to broaden to vancomycin/cefepime  7/19 >> levofloxacin >> 7/21 >> vancomycin  >> 7/21 >> cefepime  >>    Tmax:100.1 WBCs: elevated Renal: Scr low, normalized CrCl > 168ml/min  7/21 blood:  7/21 urine:  7/21 sputum:   Goal of Therapy:  Vancomycin trough level 15-20 mcg/ml  Plan:   Vancomycin 1500mg  IV x1 then 750mg  IV q8h, check steady state trough  Continue cefepime 1gm VI q8h as ordered by [facility]  Monitor renal function and culture results  Doreene Eland, PharmD, BCPS.   Pager: [phone]  10/14/2013,11:43 AM

## 2013-10-14 NOTE — Progress Notes (Signed)
Report received from E. Pelletier, RN. I agree with previous RN's assessment. Will continue to monitor pt closely. Asaro, Darrnell Mangiaracina I  

## 2013-10-14 NOTE — Progress Notes (Signed)
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 Azithromycin Rash  . Sulfa Antibiotics Rash  . Hydrocodone-Acetaminophen Nausea And Vomiting    Hallucinating   . 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 SHE DOES NOT WANT ANY BLOOD PRODUCTS OR FRACTIONS   Patient Measurements: Height: 5\' 6"  (167.6 cm) Weight: 161 lb 13.1 oz (73.4 kg) IBW/kg (Calculated) : 59.3  Vital Signs: Temp: 100.1 F (37.8 C) (07/21 0816) Temp src: Oral (07/21 0816) BP: 115/61 mmHg (07/21 0816) Pulse Rate: 130 (07/21 0816) Intake/Output from previous day: 07/20 0701 - 07/21 0700 In: 3060 [I.V.:720; NG/GT:210; IV Piggyback:150; TPN:1980] Out: 2785 [Urine:2175; Emesis/NG output:600; Drains:10] Intake/Output from this shift:    Labs:  Recent Labs  10/12/13 0440 10/13/13 0540 10/14/13 0450  WBC 18.9* 17.2* 18.4*  HGB 8.0* 7.7* 8.0*  HCT 26.3* 25.1* 25.9*  PLT 194 161 212    Recent Labs  10/12/13 0433 10/13/13 0540 10/14/13 0450  NA 131* 134* 130*  K 4.1 4.0 3.7  CL 99 100 96  CO2 27 26 25   GLUCOSE 136* 127* 140*  BUN 6 6 8   CREATININE 0.51 0.44* 0.47*  CALCIUM 8.1* 8.4 8.5  MG 1.8 1.7 1.8  PHOS 2.3 2.9  --   PROT  --  6.2 6.8  ALBUMIN  --  1.6* 1.6*  AST  --  22 15  ALT  --  12 10  ALKPHOS  --  67 85  BILITOT  --  0.2* <0.2*  PREALBUMIN  --  3.1*  --   TRIG  --  146  --    Estimated Creatinine Clearance: 95.8 ml/min (by C-G formula based on Cr of 0.47).    Recent Labs  10/13/13 1803 10/14/13 0020 10/14/13 0619  GLUCAP 120* 132* 121*   Medications:  Scheduled:  . antiseptic oral rinse  15 mL Mouth Rinse q12n4p  . chlorhexidine  15 mL Mouth Rinse BID  . insulin aspart  0-9 Units Subcutaneous 4 times per day  . levofloxacin (LEVAQUIN) IV  500 mg Intravenous Q24H  . ondansetron (ZOFRAN) IV  4 mg Intravenous 4 times per day  . pantoprazole (PROTONIX) IV  40 mg Intravenous QHS  .  sodium chloride  10-40 mL Intracatheter Q12H   Infusions:  . sodium chloride 30 mL (10/13/13 0200)  . Marland KitchenTPN (CLINIMIX-E) Adult 80 mL/hr at 10/13/13 1725   And  . fat emulsion 250 mL (10/13/13 1725)   Insulin Requirements in the past 24 hours:  3 units SSI/24h, CBGs controlled Novolog sensitive scale q4hr  Current Nutrition, IV fluids:  NPO NS at 30 ml/hr  Assessment: 29 YOF presents with N/V, weight loss d/t difficulty eating with recent dx of Gastric cancer. EGD on 09/22/2013 reveald findings of a partially obstructing oozing cratered gastric ulcer in the gastric antrum. 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 TPN started 7/16 for GOO. Surgery consulted, s/p subtotal gastrectomy with gastric jejunostomy on 7/17.    Electrolytes: Corrected Ca = 10.42, Mg 1.8 (replaced 7/18), Na = 130  Renal: SCr WNL, I/O = -52ml, NGT = 671ml out, drain 80ml out  LFT's: WNL   Glucose control: at goal of  150mg /dl, no hypoglycemia  Triglycerides: 94 (7/17), 146 (7/20) - increased, will continue to monitor   Pre-Albumin: 3.1 (7/20), 7.9 (7/17) - reflects depleted protein stores  Access: PICC placed 7/16 Day #6  TNA  Nutritional Goals:  RD consulted: Kcal: 2000-2200  Protein: 85-100 gram  Fluid: >/=2100 ml/daily  Clinimix 5/15 at 80 ml/hr + Lipids 20% at 10 ml/hr would deliver: 1850 kCal per day 96 grams of protein per day  Plan: at 1800 today  Continue Clinimix E 5/15 at goal rate 80 ml/hr  Hyponatremia - likely from free fluid in TPN - unable to change adjust electrolytes with clinimix product  Prealbumin decreased from previous but does not reflect TPN at goal rate  Continue Lipids 20% at 10 ml/hr  Reduce MIVF to 58ml/hr at 6pm for incTPN rate  Full TNA labs Monday/Thursday  Replace Mg 2g IV x 1, recheck Mg level and CMET tomorrow   MVI and Trace elements daily  Reduce SSI to q6h   Doreene Eland, PharmD, BCPS.   Pager:  [phone] 10/14/2013. 10:10 AM

## 2013-10-15 ENCOUNTER — Inpatient Hospital Stay (HOSPITAL_COMMUNITY): Payer: 59

## 2013-10-15 LAB — BASIC METABOLIC PANEL
ANION GAP: 12 (ref 5–15)
BUN: 8 mg/dL (ref 6–23)
CO2: 25 mEq/L (ref 19–32)
Calcium: 8.6 mg/dL (ref 8.4–10.5)
Chloride: 97 mEq/L (ref 96–112)
Creatinine, Ser: 0.46 mg/dL — ABNORMAL LOW (ref 0.50–1.10)
GFR calc Af Amer: >90 mL/min (ref >90)
GFR calc non Af Amer: >90 mL/min (ref >90)
Glucose, Bld: 137 mg/dL — ABNORMAL HIGH (ref 70–99)
Potassium: 3.6 mEq/L — ABNORMAL LOW (ref 3.7–5.3)
Sodium: 134 mEq/L — ABNORMAL LOW (ref 137–147)

## 2013-10-15 LAB — GLUCOSE, CAPILLARY
GLUCOSE-CAPILLARY: 114 mg/dL — AB (ref 70–99)
Glucose-Capillary: 135 mg/dL — ABNORMAL HIGH (ref 70–99)
Glucose-Capillary: 124 mg/dL — ABNORMAL HIGH (ref 70–99)
Glucose-Capillary: 125 mg/dL — ABNORMAL HIGH (ref 70–99)

## 2013-10-15 LAB — LEGIONELLA ANTIGEN, URINE: Legionella Antigen, Urine: NEGATIVE

## 2013-10-15 LAB — CBC
HCT: 25 % — ABNORMAL LOW (ref 36.0–46.0)
Hemoglobin: 7.8 g/dL — ABNORMAL LOW (ref 12.0–15.0)
MCH: 22.7 pg — ABNORMAL LOW (ref 26.0–34.0)
MCHC: 31.2 g/dL (ref 30.0–36.0)
MCV: 72.9 fL — ABNORMAL LOW (ref 78.0–100.0)
Platelets: 205 K/uL (ref 150–400)
RBC: 3.43 MIL/uL — ABNORMAL LOW (ref 3.87–5.11)
RDW: 18.5 % — ABNORMAL HIGH (ref 11.5–15.5)
WBC: 18.1 K/uL — ABNORMAL HIGH (ref 4.0–10.5)

## 2013-10-15 LAB — URINE CULTURE
Colony Count: NO GROWTH
Culture: NO GROWTH

## 2013-10-15 MED ORDER — TRACE MINERALS CR-CU-F-FE-I-MN-MO-SE-ZN IV SOLN
INTRAVENOUS | Status: AC
  Administered 2013-10-15: 17:00:00 via INTRAVENOUS
  Filled 2013-10-15: qty 2000

## 2013-10-15 MED ORDER — METHOCARBAMOL 1000 MG/10ML IJ SOLN
1000.0000 mg | Freq: Three times a day (TID) | INTRAVENOUS | Status: DC
  Administered 2013-10-15 – 2013-10-27 (×37): 1000 mg via INTRAVENOUS
  Filled 2013-10-15 (×39): qty 10

## 2013-10-15 MED ORDER — POTASSIUM CHLORIDE 10 MEQ/100ML IV SOLN
10.0000 meq | INTRAVENOUS | Status: AC
  Administered 2013-10-15 (×3): 10 meq via INTRAVENOUS
  Filled 2013-10-15 (×3): qty 100

## 2013-10-15 MED ORDER — POTASSIUM CHLORIDE 10 MEQ/100ML IV SOLN: 10.0000 meq | INTRAVENOUS | Status: DC

## 2013-10-15 MED ORDER — ACETAMINOPHEN 10 MG/ML IV SOLN
1000.0000 mg | Freq: Four times a day (QID) | INTRAVENOUS | Status: AC
  Administered 2013-10-15 – 2013-10-16 (×4): 1000 mg via INTRAVENOUS
  Filled 2013-10-15 (×4): qty 100

## 2013-10-15 MED ORDER — HYDROMORPHONE HCL PF 1 MG/ML IJ SOLN
1.0000 mg | INTRAMUSCULAR | Status: DC | PRN
  Administered 2013-10-15 – 2013-10-16 (×6): 2 mg via INTRAVENOUS
  Filled 2013-10-15 (×6): qty 2

## 2013-10-15 MED ORDER — FAT EMULSION 20 % IV EMUL
250.0000 mL | INTRAVENOUS | Status: AC
  Administered 2013-10-15: 250 mL via INTRAVENOUS
  Filled 2013-10-15: qty 250

## 2013-10-15 MED ORDER — IOHEXOL 300 MG/ML  SOLN
50.0000 mL | Freq: Once | INTRAMUSCULAR | Status: AC | PRN
  Administered 2013-10-15: 50 mL

## 2013-10-15 NOTE — Progress Notes (Signed)
PT Cancellation Note  Patient Details Name: Andrea Best MRN: [id number] DOB: 05/12/1970   Cancelled Treatment:    Reason Eval/Treat Not Completed: Patient at procedure or test/unavailableWill check back as schedule permits.   Larose Batres,KATHrine E 10/15/2013, 10:25 AM Carmelia Bake, PT, DPT 10/15/2013 Pager: [phone]

## 2013-10-15 NOTE — Progress Notes (Signed)
Physical Therapy Treatment Patient Details Name: Andrea Best MRN: [id number] DOB: 1971/03/07 Today's Date: 10/23/13    History of Present Illness 43 y.o. 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 Pt s/p subtotal gastrectomy with porta hepatis and peripancreatic lymph node dissection with a Roux-en-Y gastrojejunostomy on 10/10/13.     PT Comments    Pt able to increase ambulation distance this session however required RW and assist to steady.  Follow Up Recommendations  Home health PT;Supervision for mobility/OOB     Equipment Recommendations  Rolling walker with 5" wheels    Recommendations for Other Services       Precautions / Restrictions Precautions Precautions: Fall Precaution Comments: NG tube, R drain    Mobility  Bed Mobility               General bed mobility comments: pt up in recliner on arrival  Transfers Overall transfer level: Needs assistance Equipment used: Rolling walker (2 wheeled) Transfers: Sit to/from Stand Sit to Stand: Min guard         General transfer comment: verbal cues for safe technique  Ambulation/Gait Ambulation/Gait assistance: Min assist Ambulation Distance (Feet): 200 Feet Assistive device: Rolling walker (2 wheeled) Gait Pattern/deviations: Step-through pattern;Decreased stride length;Narrow base of support Gait velocity: decr   General Gait Details: decreased pace iniitally but able to improve a little with distance, unsteady gait with assist required, pt aware if she mobilizes with nsg to have person assist and use RW for safety   Stairs            Wheelchair Mobility    Modified Rankin (Stroke Patients Only)       Balance                                    Cognition Arousal/Alertness: Awake/alert Behavior During Therapy: WFL for tasks  assessed/performed Overall Cognitive Status: Within Functional Limits for tasks assessed                      Exercises Other Exercises Other Exercises: demonstrated marching, LAQ, ankle pumps for pt to perform as tolerated    General Comments        Pertinent Vitals/Pain Pt reports pain controlled, activity to tolerance    Home Living                      Prior Function            PT Goals (current goals can now be found in the care plan section) Progress towards PT goals: Progressing toward goals    Frequency  Min 3X/week    PT Plan Current plan remains appropriate    Co-evaluation             End of Session   Activity Tolerance: Patient tolerated treatment well Patient left: in chair;with call bell/phone within reach     Time: 1423-1447 PT Time Calculation (min): 24 min  Charges:  $Gait Training: 23-37 mins                    G Codes:      Chaye Misch,KATHrine E 10/23/13, 3:21 PM Carmelia Bake, PT, DPT 23-Oct-2013 Pager: [phone]

## 2013-10-15 NOTE — Progress Notes (Signed)
Chaplain saw pt re: advance directives.  Pt indicated that she wished to complete prior to surgery, but is now thinking of completing advance directives after discharged.  Chaplain provided information and packet on advance directives.  Pt will look at information and chaplain will follow up tomorrow to complete if pt desires.    [place], [place]

## 2013-10-15 NOTE — Progress Notes (Signed)
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 Azithromycin Rash  . Sulfa Antibiotics Rash  . Hydrocodone-Acetaminophen Nausea And Vomiting    Hallucinating   . 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 SHE DOES NOT WANT ANY BLOOD PRODUCTS OR FRACTIONS   Patient Measurements: Height: 5\' 6"  (167.6 cm) Weight: 161 lb 13.1 oz (73.4 kg) IBW/kg (Calculated) : 59.3  Vital Signs: Temp: 98.5 F (36.9 C) (07/22 0604) Temp src: Oral (07/22 0604) BP: 127/64 mmHg (07/22 0604) Pulse Rate: 132 (07/22 0604) Intake/Output from previous day: 07/21 0701 - 07/22 0700 In: 1750 [I.V.:600; NG/GT:180; IV Piggyback:250; TPN:720] Out: 2052 [Urine:1800; Emesis/NG output:250; Drains:2] Intake/Output from this shift: Total I/O In: 1448 [I.V.:600; NG/GT:90; IV Piggyback:200; TPN:720] Out: 2050 [Urine:1800; Emesis/NG output:250]  Labs:  Recent Labs  10/13/13 0540 10/14/13 0450 10/15/13 0447  WBC 17.2* 18.4* 18.1*  HGB 7.7* 8.0* 7.8*  HCT 25.1* 25.9* 25.0*  PLT 161 212 205    Recent Labs  10/13/13 0540 10/14/13 0450 10/15/13 0447  NA 134* 130* 134*  K 4.0 3.7 3.6*  CL 100 96 97  CO2 26 25 25   GLUCOSE 127* 140* 137*  BUN 6 8 8   CREATININE 0.44* 0.47* 0.46*  CALCIUM 8.4 8.5 8.6  MG 1.7 1.8  --   PHOS 2.9  --   --   PROT 6.2 6.8  --   ALBUMIN 1.6* 1.6*  --   AST 22 15  --   ALT 12 10  --   ALKPHOS 67 85  --   BILITOT 0.2* <0.2*  --   PREALBUMIN 3.1*  --   --   TRIG 146  --   --    Estimated Creatinine Clearance: 95.8 ml/min (by C-G formula based on Cr of 0.46).    Recent Labs  10/14/13 1808 10/14/13 2351 10/15/13 0613  GLUCAP 136* 141* 135*   Medications:  Scheduled:  . antiseptic oral rinse  15 mL Mouth Rinse q12n4p  . ceFEPime (MAXIPIME) IV  1 g Intravenous 3 times per day  . chlorhexidine  15 mL Mouth Rinse BID  . insulin aspart  0-9 Units Subcutaneous 4 times per day  .  ondansetron (ZOFRAN) IV  4 mg Intravenous 4 times per day  . pantoprazole (PROTONIX) IV  40 mg Intravenous QHS  . sodium chloride  10-40 mL Intracatheter Q12H  . vancomycin  750 mg Intravenous Q8H   Infusions:  . sodium chloride 30 mL/hr at 10/14/13 1317  . Marland KitchenTPN (CLINIMIX-E) Adult 80 mL/hr at 10/14/13 1726   And  . fat emulsion 250 mL (10/14/13 1726)   Insulin Requirements in the past 24 hours:  5 units SSI/24h, CBGs controlled Novolog sensitive scale q4hr  Current Nutrition, IV fluids:  NPO NS at 30 ml/hr  Assessment: 31 YOF presents with N/V, weight loss d/t difficulty eating with recent dx of Gastric cancer. EGD on 09/22/2013 reveald findings of a partially obstructing oozing cratered gastric ulcer in the gastric antrum. 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 TPN started 7/16 for GOO. Surgery consulted, s/p subtotal gastrectomy with gastric jejunostomy on 7/17.    Electrolytes: Corrected Ca = 10.4, Na = 134 (improved)  Renal: SCr WNL, I/O = + 197ml, NGT = 259ml out (improved), drain 65ml out  LFT's: WNL   Glucose control: at goal of  150mg /dl, no hypoglycemia  Triglycerides: 146 (7/20),  94 (7/17), - increased, will continue to monitor    Pre-Albumin: 3.1 (7/20), 7.9 (7/17) - reflects depleted protein stores  Access: PICC placed 7/16 Day #7 TNA  Nutritional Goals:  RD consulted: Kcal: 2000-2200  Protein: 85-100 gram  Fluid: >/=2100 ml/daily  Clinimix 5/15 at 80 ml/hr + Lipids 20% at 10 ml/hr would deliver: 1850 kCal per day 96 grams of protein per day  Plan: at 1800 today  Continue Clinimix E 5/15 at goal rate 80 ml/hr  Hyponatremia - likely from free fluid in TPN - unable to change adjust electrolytes with clinimix product  Prealbumin decreased from previous but does not reflect TPN at goal rate  Continue Lipids 20% at 10 ml/hr  Give KCl x 3 runs  Full TNA labs Monday/Thursday  MVI and Trace elements  daily  Continue SSI at q6h   Doreene Eland, PharmD, BCPS.   Pager: [phone] 10/15/2013. 6:57 AM

## 2013-10-15 NOTE — Progress Notes (Signed)
Seen and agree  

## 2013-10-15 NOTE — Progress Notes (Signed)
[facility] Progress Note  5 Days Post-Op  Subjective: Pt c/o pain in abdomen, mostly over midline incision, not at drain site.  Ambulating OOB with PT.  No N/V.  Not hungry.    Objective: Vital signs in last 24 hours: Temp:  [98.5 F (36.9 C)-101.5 F (38.6 C)] 98.5 F (36.9 C) (07/22 0604) Pulse Rate:  [130-147] 132 (07/22 0604) Resp:  [16-18] 16 (07/22 0604) BP: (115-127)/(61-73) 127/64 mmHg (07/22 0604) SpO2:  [94 %-96 %] 94 % (07/22 0604) Last BM Date: 09/30/13  Intake/Output from previous day: 07/21 0701 - 07/22 0700 In: 1750 [I.V.:600; NG/GT:180; IV Piggyback:250; TPN:720] Out: 2052 [Urine:1800; Emesis/NG output:250; Drains:2] Intake/Output this shift:    PE: Gen:  Alert, NAD, pleasant Abd: Soft, mild distension, tender in the midline, +BS, no HSM, midline incision staples in place, dressing C/D/I, drain with minimal brown clear drainage (33mL/24hr)   Lab Results:   Recent Labs  10/14/13 0450 10/15/13 0447  WBC 18.4* 18.1*  HGB 8.0* 7.8*  HCT 25.9* 25.0*  PLT 212 205   BMET  Recent Labs  10/14/13 0450 10/15/13 0447  NA 130* 134*  K 3.7 3.6*  CL 96 97  CO2 25 25  GLUCOSE 140* 137*  BUN 8 8  CREATININE 0.47* 0.46*  CALCIUM 8.5 8.6   PT/INR No results found for this basename: LABPROT, INR,  in the last 72 hours CMP     Component Value Date/Time   NA 134* 10/15/2013 0447   K 3.6* 10/15/2013 0447   CL 97 10/15/2013 0447   CO2 25 10/15/2013 0447   GLUCOSE 137* 10/15/2013 0447   BUN 8 10/15/2013 0447   CREATININE 0.46* 10/15/2013 0447   CREATININE 0.63 05/26/2013 1517   CALCIUM 8.6 10/15/2013 0447   PROT 6.8 10/14/2013 0450   ALBUMIN 1.6* 10/14/2013 0450   AST 15 10/14/2013 0450   ALT 10 10/14/2013 0450   ALKPHOS 85 10/14/2013 0450   BILITOT <0.2* 10/14/2013 0450   GFRNONAA >90 10/15/2013 0447   GFRAA >90 10/15/2013 0447   Lipase  No results found for this basename: lipase       Studies/Results: Dg Chest 2 View  10/14/2013   CLINICAL  DATA:  Chest pain. Worsening pneumonia. Fever. Shortness of breath.  EXAM: CHEST  2 VIEW  COMPARISON:  Chest x-ray 10/12/2013.  FINDINGS: There is a right upper extremity PICC with tip terminating in the superior aspect of the right atrium. Nasogastric tube in place extending into the mid stomach. Lung volumes are low. Worsening opacification at the base of the left hemithorax, compatible with atelectasis and/or consolidation in the left lower lobe, was superimposed moderate left pleural effusion. Right lung appears clear. No evidence of pulmonary edema. Heart size is within normal limits. Upper mediastinal contours are unremarkable. Numerous surgical clips and skin staples are seen overlying the epigastric region.  IMPRESSION: 1. Worsening atelectasis and/or consolidation in the left lower lobe with increasing moderate left pleural effusion. 2. Support apparatus, as above.   Electronically Signed   By: Vinnie Langton M.D.   On: 10/14/2013 14:14    Anti-infectives: Anti-infectives   Start     Dose/Rate Route Frequency Ordered Stop   10/14/13 2200  vancomycin (VANCOCIN) IVPB 750 mg/150 ml premix     750 mg 150 mL/hr over 60 Minutes Intravenous Every 8 hours 10/14/13 1158     10/14/13 1400  ceFEPIme (MAXIPIME) 1 g in dextrose 5 % 50 mL IVPB     1 g  100 mL/hr over 30 Minutes Intravenous 3 times per day 10/14/13 1125 10/22/13 1359   10/14/13 1400  vancomycin (VANCOCIN) 1,500 mg in sodium chloride 0.9 % 500 mL IVPB     1,500 mg 250 mL/hr over 120 Minutes Intravenous  Once 10/14/13 1158 10/14/13 1658   10/12/13 1600  levofloxacin (LEVAQUIN) IVPB 500 mg  Status:  Discontinued     500 mg 100 mL/hr over 60 Minutes Intravenous Every 24 hours 10/12/13 1545 10/14/13 1124   10/10/13 1230  ceFAZolin (ANCEF) IVPB 2 g/50 mL premix     2 g 100 mL/hr over 30 Minutes Intravenous  Once 10/10/13 1220 10/10/13 1222   10/10/13 0600  [MAR Hold]  ceFAZolin (ANCEF) IVPB 2 g/50 mL premix     (On MAR Hold since 10/10/13  0659)   2 g 100 mL/hr over 30 Minutes Intravenous On call to O.R. 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)  POD #5 s/p subtotal gastrectomy with Roux-en-Y gastrojejunostomy & porta hepatis and peripancreatic lymph node dissection  Intractable N/V - resolved  Microcytic anemia/blood loss anemia  Leukocytosis - at 18.1 today  ABL Anemia - down to Hgb 7.8  PCM on TPN  Urinary retention - foley back in  HCAP  Plan:  -Afebrile, WBC trending back up to 18.1, tachycardic - appears to be pulm source on vanc and cefepime -Encourage incentive spirometer, SCD's, and would not start heparin or lovenox yet given drop in Hgb/Hct and patients blood administration preferences  -Continue TPN, NPO, IVF, pain control, antiemetics  -Await bowel function prior to advancing diet -UGI today to check for anastomotic leak at [place] junction given elevated WBC -PT following, needs to ambulate more -D/c foley, I&O cath if needed -Decrease dosage/frequence of dilaudid, add robaxin, IV tylenol to help with pain control, orals when we start a diet -Hgb up to 7.8, seems to be stabilizing, pt does not desire blood because she is a Jehovah's witness      LOS: 7 days    DORT, Hasina Kreager 10/15/2013, 7:55 AM Pager: [phone]

## 2013-10-15 NOTE — Progress Notes (Signed)
Patient ID: Andrea Best, female   DOB: 1971/02/05, 43 y.o.   MRN: [id number] [facility] PROGRESS NOTE  Andrea Best OEH:[id number] DOB: 1970/10/27 DOA: 10/08/2013 PCP: Conni Slipper, MD  Brief narrative: 43 y.o. 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 She underwent subtotal gastrectomy with porta hepatis and peripancreatic lymph node dissection with a Roux-en-Y gastrojejunostomy on 10/10/13.   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 In SDU post-op. Remains stable on medical floor. Appreciate Dr. Benay Spice following for consideration of adjuvant treatment options. Surgery following as well. Continue TPN, n.p.o., pain control and antiemetics as needed Active Problems:  Postoperative Fever/tachycardia / HCAP CXR suggestive of developing PNA. Pt started on Levaquin but this was changed to maxipime and vanco on 10/14/13. Hypomagnesemia / hypophosphatemia /hypokalemia Replete as needed. 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  Hemoglobin ranges 7.8-8.0  Protein-calorie malnutrition, severe  Pt meets criteria for severe MALNUTRITION in the context of chronic illness as evidenced by PO intake < 75% for > one month, 16% body weight loss in 4 months.  PICC line placed for TNA administration. Lupus/Sjogren syndrome  Plaquenil and prednisone currently on hold. DVT Prophylaxis  Hold heparin or Lovenox due to drop in Hg    Code Status: Full.  Family  Communication: Mother updated at the bedside 10/09/13, no family currently at the bedside.  Disposition Plan: home when stable   IV Access:   PICC placed 10/09/13.  [name], MD  [facility] Pager [phone]  If 7PM-7AM, please contact night-coverage [email or web address] Password [email or web address] 10/15/2013, 3:47 PM   LOS: 7 days   Consultants: Dr. Betsy Coder, Oncology.  Dr. Excell Seltzer, Surgery  Other consultants:  [place]  Procedures: Chest x-ray 10/08/13: No active disease.  Subtotal gastrectomy with porta hepatis and peripancreatic lymph node dissection, Roux-en-Y gastrojejunostomy 10/10/13 by Dr. Excell Seltzer.  Chest x-ray 10/12/13: ? Developing PNA  Antibiotics:  Levaquin 7/19 --> 7/21   Maxipime 7/21 -->   Vancomycin 7/21 -->  HPI/Subjective: No acute overnight events.  Objective: Filed Vitals:   10/14/13 1812 10/14/13 1929 10/14/13 1956 10/15/13 0604  BP: 123/63  121/65 127/64  Pulse: 147  133 132  Temp: 101.5 F (38.6 C) 100.1 F (37.8 C) 99.7 F (37.6 C) 98.5 F (36.9 C)  TempSrc: Oral Oral Oral Oral  Resp: 18  18 16   Height:      Weight:      SpO2: 95%  96% 94%    Intake/Output Summary (Last 24 hours) at 10/15/13 1547 Last data filed at 10/15/13 1345  Gross per 24 hour  Intake   1640 ml  Output   2925 ml  Net  -1285 ml    Exam:   General:  Pt is alert, follows commands appropriately, not in acute distress  Cardiovascular: Regular rate and rhythm, S1/S2, no murmurs  Respiratory: Clear to auscultation bilaterally, no wheezing, no crackles, no rhonchi  Abdomen: Soft, non tender, non distended, bowel sounds present  Extremities: No edema, pulses DP and PT palpable bilaterally  Neuro: Grossly nonfocal  Data Reviewed: Basic Metabolic  Panel:  Recent Labs Lab 10/09/13 0433 10/10/13 0550 10/11/13 0420 10/12/13 0433 10/13/13 0540 10/14/13 0450 10/15/13 0447  NA  --  140 133* 131* 134* 130* 134*  K  --  3.9 4.2  4.1 4.0 3.7 3.6*  CL  --  106 99 99 100 96 97  CO2  --  25 25 27 26 25 25   GLUCOSE  --  90 134* 136* 127* 140* 137*  BUN  --  4* 8 6 6 8 8   CREATININE  --  0.55 0.51 0.51 0.44* 0.47* 0.46*  CALCIUM  --  8.6 8.2* 8.1* 8.4 8.5 8.6  MG 1.9 1.8 1.4* 1.8 1.7 1.8  --   PHOS 4.8* 5.1* 4.8* 2.3 2.9  --   --    Liver Function Tests:  Recent Labs Lab 10/09/13 0427 10/10/13 0550 10/11/13 0420 10/13/13 0540 10/14/13 0450  AST 32 27 33 22 15  ALT 22 19 21 12 10   ALKPHOS 45 43 42 67 85  BILITOT 0.3 0.2* 0.2* 0.2* <0.2*  PROT 7.2 6.8 6.4 6.2 6.8  ALBUMIN 2.5* 2.3* 2.0* 1.6* 1.6*   No results found for this basename: LIPASE, AMYLASE,  in the last 168 hours No results found for this basename: AMMONIA,  in the last 168 hours CBC:  Recent Labs Lab 10/10/13 0550  10/11/13 0420 10/12/13 0440 10/13/13 0540 10/14/13 0450 10/15/13 0447  WBC 3.7*  --  22.4* 18.9* 17.2* 18.4* 18.1*  NEUTROABS 1.5*  --   --   --  14.5*  --   --   HGB 9.0*  < > 9.0* 8.0* 7.7* 8.0* 7.8*  HCT 30.0*  < > 29.0* 26.3* 25.1* 25.9* 25.0*  MCV 73.0*  --  73.2* 73.3* 72.8* 73.4* 72.9*  PLT 338  --  264 194 161 212 205  < > = values in this interval not displayed. Cardiac Enzymes: No results found for this basename: CKTOTAL, CKMB, CKMBINDEX, TROPONINI,  in the last 168 hours BNP: No components found with this basename: POCBNP,  CBG:  Recent Labs Lab 10/14/13 1204 10/14/13 1808 10/14/13 2351 10/15/13 0613 10/15/13 1214  GLUCAP 136* 136* 141* 135* 124*    Recent Results (from the past 240 hour(s))  MRSA PCR SCREENING     Status: None   Collection Time    10/10/13  3:39 PM      Result Value Ref Range Status   MRSA by PCR NEGATIVE  NEGATIVE Final   Comment:            The GeneXpert MRSA Assay (FDA     approved for NASAL specimens     only), is one component of a     comprehensive MRSA colonization     surveillance program. It is not     intended to diagnose MRSA     infection nor to guide or      monitor treatment for     MRSA infections.  CULTURE, BLOOD (ROUTINE X 2)     Status: None   Collection Time    10/14/13 11:50 AM      Result Value Ref Range Status   Specimen Description BLOOD LEFT ARM   Final   Special Requests BOTTLES DRAWN AEROBIC AND ANAEROBIC [facility] EACH   Final   Culture  Setup Time     Final   Value: 10/14/2013 14:17     Performed at [facility]   Culture     Final   Value:  BLOOD CULTURE RECEIVED NO GROWTH TO DATE CULTURE WILL BE HELD FOR 5 DAYS BEFORE ISSUING A FINAL NEGATIVE REPORT     Performed at [facility]   Report Status PENDING   Incomplete  CULTURE, BLOOD (ROUTINE X 2)     Status: None   Collection Time    10/14/13 11:53 AM      Result Value Ref Range Status   Specimen Description BLOOD LEFT HAND   Final   Special Requests BOTTLES DRAWN AEROBIC AND ANAEROBIC 5CC EACH   Final   Culture  Setup Time     Final   Value: 10/14/2013 14:17     Performed at [facility]   Culture     Final   Value:        BLOOD CULTURE RECEIVED NO GROWTH TO DATE CULTURE WILL BE HELD FOR 5 DAYS BEFORE ISSUING A FINAL NEGATIVE REPORT     Performed at [facility]   Report Status PENDING   Incomplete  URINE CULTURE     Status: None   Collection Time    10/14/13 12:09 PM      Result Value Ref Range Status   Specimen Description URINE, CATHETERIZED   Final   Special Requests NONE   Final   Culture  Setup Time     Final   Value: 10/14/2013 17:48     Performed at [facility]     Final   Value: NO GROWTH     Performed at [facility]   Culture     Final   Value: NO GROWTH     Performed at [facility]   Report Status 10/15/2013 FINAL   Final  CULTURE, EXPECTORATED SPUTUM-ASSESSMENT     Status: None   Collection Time    10/14/13  2:59 PM      Result Value Ref Range Status   Specimen Description SPUTUM   Final   Special Requests NONE   Final   Sputum evaluation     Final   Value: THIS  SPECIMEN IS ACCEPTABLE. RESPIRATORY CULTURE REPORT TO FOLLOW.   Report Status 10/14/2013 FINAL   Final  CULTURE, RESPIRATORY (NON-EXPECTORATED)     Status: None   Collection Time    10/14/13  2:59 PM      Result Value Ref Range Status   Specimen Description SPUTUM   Final   Special Requests NONE   Final   Gram Stain     Final   Value: RARE WBC PRESENT, PREDOMINANTLY MONONUCLEAR     FEW SQUAMOUS EPITHELIAL CELLS PRESENT     MODERATE GRAM POSITIVE COCCI IN PAIRS     IN CLUSTERS FEW GRAM POSITIVE RODS     RARE GRAM NEGATIVE RODS   Culture     Final   Value: Culture reincubated for better growth     Performed at [facility]   Report Status PENDING   Incomplete     Studies: Dg Chest 2 View 10/14/2013   IMPRESSION: 1. Worsening atelectasis and/or consolidation in the left lower lobe with increasing moderate left pleural effusion. 2. Support apparatus, as above.   Electronically Signed   By: Vinnie Langton M.D.   On: 10/14/2013 14:14   Dg Ugi W/water Sol Cm 10/15/2013     IMPRESSION: 1. No evidence of anastomotic leak. Slow transit of contrast from the stomach to the jejunostomy loop. 2. Small volume gastroesophageal reflux to which the patient was  mildly symptomatic.    Scheduled Meds: . acetaminophen  1,000 mg Intravenous 4 times per day  . antiseptic oral rinse  15 mL Mouth Rinse q12n4p  . ceFEPime (MAXIPIME) IV  1 g Intravenous 3 times per day  . chlorhexidine  15 mL Mouth Rinse BID  . insulin aspart  0-9 Units Subcutaneous 4 times per day  . methocarbamol (ROBAXIN) IV  1,000 mg Intravenous Q8H  . ondansetron (ZOFRAN) IV  4 mg Intravenous 4 times per day  . pantoprazole (PROTONIX) IV  40 mg Intravenous QHS  . sodium chloride  10-40 mL Intracatheter Q12H  . vancomycin  750 mg Intravenous Q8H   Continuous Infusions: . sodium chloride 30 mL/hr at 10/14/13 1317  . Marland KitchenTPN (CLINIMIX-E) Adult 80 mL/hr at 10/14/13 1726   And  . fat emulsion 250 mL (10/14/13 1726)  . Marland KitchenTPN  (CLINIMIX-E) Adult     And  . fat emulsion

## 2013-10-16 LAB — COMPREHENSIVE METABOLIC PANEL
ALBUMIN: 1.6 g/dL — AB (ref 3.5–5.2)
ALT: 19 U/L (ref 0–35)
ANION GAP: 9 (ref 5–15)
AST: 29 U/L (ref 0–37)
Alkaline Phosphatase: 165 U/L — ABNORMAL HIGH (ref 39–117)
BILIRUBIN TOTAL: 0.3 mg/dL (ref 0.3–1.2)
BUN: 7 mg/dL (ref 6–23)
CALCIUM: 8.8 mg/dL (ref 8.4–10.5)
CHLORIDE: 99 meq/L (ref 96–112)
CO2: 26 mEq/L (ref 19–32)
CREATININE: 0.38 mg/dL — AB (ref 0.50–1.10)
GFR calc Af Amer: >90 mL/min (ref >90)
GFR calc non Af Amer: >90 mL/min (ref >90)
Glucose, Bld: 117 mg/dL — ABNORMAL HIGH (ref 70–99)
Potassium: 3.6 mEq/L — ABNORMAL LOW (ref 3.7–5.3)
Sodium: 134 mEq/L — ABNORMAL LOW (ref 137–147)
TOTAL PROTEIN: 7 g/dL (ref 6.0–8.3)

## 2013-10-16 LAB — GLUCOSE, CAPILLARY
GLUCOSE-CAPILLARY: 141 mg/dL — AB (ref 70–99)
Glucose-Capillary: 116 mg/dL — ABNORMAL HIGH (ref 70–99)
Glucose-Capillary: 127 mg/dL — ABNORMAL HIGH (ref 70–99)
Glucose-Capillary: 109 mg/dL — ABNORMAL HIGH (ref 70–99)

## 2013-10-16 LAB — PHOSPHORUS: PHOSPHORUS: 4.4 mg/dL (ref 2.3–4.6)

## 2013-10-16 LAB — CBC
HCT: 24.6 % — ABNORMAL LOW (ref 36.0–46.0)
Hemoglobin: 7.6 g/dL — ABNORMAL LOW (ref 12.0–15.0)
MCH: 22.6 pg — AB (ref 26.0–34.0)
MCHC: 30.9 g/dL (ref 30.0–36.0)
MCV: 73.2 fL — ABNORMAL LOW (ref 78.0–100.0)
Platelets: 207 10*3/uL (ref 150–400)
RBC: 3.36 MIL/uL — ABNORMAL LOW (ref 3.87–5.11)
RDW: 18.9 % — ABNORMAL HIGH (ref 11.5–15.5)
WBC: 18.6 10*3/uL — ABNORMAL HIGH (ref 4.0–10.5)

## 2013-10-16 LAB — CULTURE, RESPIRATORY W GRAM STAIN: Culture: NORMAL

## 2013-10-16 LAB — VANCOMYCIN, TROUGH: VANCOMYCIN TR: 9.8 ug/mL — AB (ref 10.0–20.0)

## 2013-10-16 LAB — MAGNESIUM: Magnesium: 2 mg/dL (ref 1.5–2.5)

## 2013-10-16 LAB — CULTURE, RESPIRATORY

## 2013-10-16 LAB — TROPONIN I: Troponin I: <0.3 ng/mL (ref <0.30)

## 2013-10-16 MED ORDER — CLINIMIX E/DEXTROSE (5/15) 5 % IV SOLN
INTRAVENOUS | Status: AC
  Administered 2013-10-16: 18:00:00 via INTRAVENOUS
  Filled 2013-10-16: qty 2000

## 2013-10-16 MED ORDER — ALBUTEROL SULFATE (2.5 MG/3ML) 0.083% IN NEBU
2.5000 mg | INHALATION_SOLUTION | Freq: Four times a day (QID) | RESPIRATORY_TRACT | Status: DC
  Administered 2013-10-16: 2.5 mg via RESPIRATORY_TRACT
  Filled 2013-10-16: qty 3

## 2013-10-16 MED ORDER — VANCOMYCIN HCL IN DEXTROSE 1-5 GM/200ML-% IV SOLN
1000.0000 mg | Freq: Three times a day (TID) | INTRAVENOUS | Status: DC
  Administered 2013-10-16 – 2013-10-17 (×5): 1000 mg via INTRAVENOUS
  Filled 2013-10-16 (×7): qty 200

## 2013-10-16 MED ORDER — POTASSIUM CHLORIDE 10 MEQ/100ML IV SOLN
10.0000 meq | INTRAVENOUS | Status: AC
  Administered 2013-10-16 (×3): 10 meq via INTRAVENOUS
  Filled 2013-10-16 (×3): qty 100

## 2013-10-16 MED ORDER — FAT EMULSION 20 % IV EMUL
250.0000 mL | INTRAVENOUS | Status: AC
  Administered 2013-10-16: 250 mL via INTRAVENOUS
  Filled 2013-10-16: qty 250

## 2013-10-16 MED ORDER — HYDROMORPHONE HCL PF 1 MG/ML IJ SOLN
1.0000 mg | INTRAMUSCULAR | Status: DC | PRN
  Administered 2013-10-16 – 2013-10-17 (×3): 1 mg via INTRAVENOUS
  Filled 2013-10-16 (×2): qty 1

## 2013-10-16 MED ORDER — HYDROMORPHONE HCL PF 1 MG/ML IJ SOLN
1.0000 mg | Freq: Four times a day (QID) | INTRAMUSCULAR | Status: DC | PRN
  Administered 2013-10-16: 1 mg via INTRAVENOUS
  Filled 2013-10-16 (×2): qty 1

## 2013-10-16 MED ORDER — ALBUTEROL SULFATE (2.5 MG/3ML) 0.083% IN NEBU
2.5000 mg | INHALATION_SOLUTION | Freq: Three times a day (TID) | RESPIRATORY_TRACT | Status: DC
  Administered 2013-10-16 – 2013-10-18 (×4): 2.5 mg via RESPIRATORY_TRACT
  Filled 2013-10-16 (×5): qty 3

## 2013-10-16 MED ORDER — ACETAMINOPHEN 10 MG/ML IV SOLN
1000.0000 mg | Freq: Four times a day (QID) | INTRAVENOUS | Status: AC
  Administered 2013-10-16 – 2013-10-17 (×4): 1000 mg via INTRAVENOUS
  Filled 2013-10-16 (×5): qty 100

## 2013-10-16 NOTE — Progress Notes (Signed)
Patient ID: Andrea Best, female   DOB: [birth year], 43 y.o.   MRN: [id number] [facility] PROGRESS NOTE  Andrea Best IWP:[id number] DOB: 1971-01-14 DOA: 10/08/2013 PCP: Conni Slipper, MD  Brief narrative: 43 y.o. 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 She underwent subtotal gastrectomy with porta hepatis and peripancreatic lymph node dissection with a Roux-en-Y gastrojejunostomy on 10/10/13.   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 Was in SDU post-op. Remains stable on medical floor. NG output about 150 in past 24 hours. Appreciate Dr. Benay Spice recommendations for consideration of adjuvant treatment options. Surgery following as well.  Continue TPN, n.p.o., pain control and antiemetics as needed Active Problems:  Postoperative Fever/tachycardia / HCAP  CXR suggestive of developing PNA. Pt started on Levaquin but this was changed to maxipime and vanco on 10/14/13. Hypomagnesemia / hypophosphatemia /hypokalemia  Replete as needed. 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  Hemoglobin ranges 7.8-8.0  Protein-calorie malnutrition, severe  Pt meets criteria for severe MALNUTRITION in the context of chronic illness as evidenced by PO intake < 75% for > one month, 16% body weight loss in 4 months.  PICC line placed for TNA administration. Lupus/Sjogren syndrome  Plaquenil and prednisone currently on hold. DVT Prophylaxis  Hold heparin or Lovenox due  to drop in Hg    Code Status: Full.  Family Communication: Mother updated at the bedside 10/09/13, no family currently at the bedside.  Disposition Plan: home when stable    IV access: PICC placed 10/09/13. Consultants:  Dr. Betsy Coder, Oncology.  Dr. Excell Seltzer, Surgery Other consultants:  Baker Procedures:  Chest x-ray 10/08/13: No active disease.  Subtotal gastrectomy with porta hepatis and peripancreatic lymph node dissection, Roux-en-Y gastrojejunostomy 10/10/13 by Dr. Excell Seltzer.  Chest x-ray 10/12/13: ? Developing PNA Antibiotics:  Levaquin 7/19 --> 7/21  Maxipime 7/21 -->  Vancomycin 7/21 -->   [name], MD  [facility] Pager [phone]  If 7PM-7AM, please contact night-coverage [email or web address] Password [facility] 10/16/2013, 6:58 AM   LOS: 8 days    HPI/Subjective: No acute overnight events.  Objective: Filed Vitals:   10/14/13 1929 10/14/13 1956 10/15/13 0604 10/15/13 2133  BP:  121/65 127/64 124/58  Pulse:  133 132 122  Temp: 100.1 F (37.8 C) 99.7 F (37.6 C) 98.5 F (36.9 C) 99.5 F (37.5 C)  TempSrc: Oral Oral Oral Oral  Resp:  18 16 18   Height:      Weight:      SpO2:  96% 94% 98%    Intake/Output Summary (Last 24 hours) at 10/16/13 7341 Last data filed at 10/16/13 0606  Gross per 24 hour  Intake   1770 ml  Output   3130 ml  Net  -1360 ml    Exam:   General:  Pt is alert, follows commands appropriately, not in acute distress  Cardiovascular: Regular rate and rhythm, S1/S2, no murmurs  Respiratory: Clear to auscultation bilaterally, no wheezing  Abdomen: abd incision, dressing in place, drain with brownish color, bowel sounds present  Extremities: No edema, pulses DP and PT palpable  bilaterally  Neuro: Grossly nonfocal  Data Reviewed: Basic Metabolic Panel:  Recent Labs Lab 10/10/13 0550 10/11/13 0420 10/12/13 0433 10/13/13 0540 10/14/13 0450 10/15/13 0447 10/16/13 0530  NA 140 133*  131* 134* 130* 134* 134*  K 3.9 4.2 4.1 4.0 3.7 3.6* 3.6*  CL 106 99 99 100 96 97 99  CO2 25 25 27 26 25 25 26   GLUCOSE 90 134* 136* 127* 140* 137* 117*  BUN 4* 8 6 6 8 8 7   CREATININE 0.55 0.51 0.51 0.44* 0.47* 0.46* 0.38*  CALCIUM 8.6 8.2* 8.1* 8.4 8.5 8.6 8.8  MG 1.8 1.4* 1.8 1.7 1.8  --  2.0  PHOS 5.1* 4.8* 2.3 2.9  --   --  4.4   Liver Function Tests:  Recent Labs Lab 10/10/13 0550 10/11/13 0420 10/13/13 0540 10/14/13 0450 10/16/13 0530  AST 27 33 22 15 29   ALT 19 21 12 10 19   ALKPHOS 43 42 67 85 165*  BILITOT 0.2* 0.2* 0.2* <0.2* 0.3  PROT 6.8 6.4 6.2 6.8 7.0  ALBUMIN 2.3* 2.0* 1.6* 1.6* 1.6*   No results found for this basename: LIPASE, AMYLASE,  in the last 168 hours No results found for this basename: AMMONIA,  in the last 168 hours CBC:  Recent Labs Lab 10/10/13 0550  10/12/13 0440 10/13/13 0540 10/14/13 0450 10/15/13 0447 10/16/13 0530  WBC 3.7*  < > 18.9* 17.2* 18.4* 18.1* 18.6*  NEUTROABS 1.5*  --   --  14.5*  --   --   --   HGB 9.0*  < > 8.0* 7.7* 8.0* 7.8* 7.6*  HCT 30.0*  < > 26.3* 25.1* 25.9* 25.0* 24.6*  MCV 73.0*  < > 73.3* 72.8* 73.4* 72.9* 73.2*  PLT 338  < > 194 161 212 205 207  < > = values in this interval not displayed. Cardiac Enzymes: No results found for this basename: CKTOTAL, CKMB, CKMBINDEX, TROPONINI,  in the last 168 hours BNP: No components found with this basename: POCBNP,  CBG:  Recent Labs Lab 10/14/13 2351 10/15/13 0613 10/15/13 1214 10/15/13 1758 10/15/13 2319  GLUCAP 141* 135* 124* 114* 125*    Recent Results (from the past 240 hour(s))  MRSA PCR SCREENING     Status: None   Collection Time    10/10/13  3:39 PM      Result Value Ref Range Status   MRSA by PCR NEGATIVE  NEGATIVE Final   Comment:            The GeneXpert MRSA Assay (FDA     approved for NASAL specimens     only), is one component of a     comprehensive MRSA colonization     surveillance program. It is not     intended to diagnose MRSA      infection nor to guide or     monitor treatment for     MRSA infections.  CULTURE, BLOOD (ROUTINE X 2)     Status: None   Collection Time    10/14/13 11:50 AM      Result Value Ref Range Status   Specimen Description BLOOD LEFT ARM   Final   Special Requests BOTTLES DRAWN AEROBIC AND ANAEROBIC [facility] EACH   Final   Culture  Setup Time     Final   Value: 10/14/2013 14:17     Performed at [facility]   Culture     Final   Value:  BLOOD CULTURE RECEIVED NO GROWTH TO DATE CULTURE WILL BE HELD FOR 5 DAYS BEFORE ISSUING A FINAL NEGATIVE REPORT     Performed at [facility]   Report Status PENDING   Incomplete  CULTURE, BLOOD (ROUTINE X 2)     Status: None   Collection Time    10/14/13 11:53 AM      Result Value Ref Range Status   Specimen Description BLOOD LEFT HAND   Final   Special Requests BOTTLES DRAWN AEROBIC AND ANAEROBIC 5CC EACH   Final   Culture  Setup Time     Final   Value: 10/14/2013 14:17     Performed at [facility]   Culture     Final   Value:        BLOOD CULTURE RECEIVED NO GROWTH TO DATE CULTURE WILL BE HELD FOR 5 DAYS BEFORE ISSUING A FINAL NEGATIVE REPORT     Performed at [facility]   Report Status PENDING   Incomplete  URINE CULTURE     Status: None   Collection Time    10/14/13 12:09 PM      Result Value Ref Range Status   Specimen Description URINE, CATHETERIZED   Final   Special Requests NONE   Final   Culture  Setup Time     Final   Value: 10/14/2013 17:48     Performed at [facility]     Final   Value: NO GROWTH     Performed at [facility]   Culture     Final   Value: NO GROWTH     Performed at [facility]   Report Status 10/15/2013 FINAL   Final  CULTURE, EXPECTORATED SPUTUM-ASSESSMENT     Status: None   Collection Time    10/14/13  2:59 PM      Result Value Ref Range Status   Specimen Description SPUTUM   Final   Special Requests NONE   Final   Sputum evaluation      Final   Value: THIS SPECIMEN IS ACCEPTABLE. RESPIRATORY CULTURE REPORT TO FOLLOW.   Report Status 10/14/2013 FINAL   Final  CULTURE, RESPIRATORY (NON-EXPECTORATED)     Status: None   Collection Time    10/14/13  2:59 PM      Result Value Ref Range Status   Specimen Description SPUTUM   Final   Special Requests NONE   Final   Gram Stain     Final   Value: RARE WBC PRESENT, PREDOMINANTLY MONONUCLEAR     FEW SQUAMOUS EPITHELIAL CELLS PRESENT     MODERATE GRAM POSITIVE COCCI IN PAIRS     IN CLUSTERS FEW GRAM POSITIVE RODS     RARE GRAM NEGATIVE RODS   Culture     Final   Value: Culture reincubated for better growth     Performed at [facility]   Report Status PENDING   Incomplete     Studies: Dg Chest 2 View  10/14/2013   CLINICAL DATA:  Chest pain. Worsening pneumonia. Fever. Shortness of breath.  EXAM: CHEST  2 VIEW  COMPARISON:  Chest x-ray 10/12/2013.  FINDINGS: There is a right upper extremity PICC with tip terminating in the superior aspect of the right atrium. Nasogastric tube in place extending into the mid stomach. Lung volumes are low. Worsening opacification at the base of the left hemithorax, compatible with atelectasis and/or consolidation in the left lower lobe, was superimposed  moderate left pleural effusion. Right lung appears clear. No evidence of pulmonary edema. Heart size is within normal limits. Upper mediastinal contours are unremarkable. Numerous surgical clips and skin staples are seen overlying the epigastric region.  IMPRESSION: 1. Worsening atelectasis and/or consolidation in the left lower lobe with increasing moderate left pleural effusion. 2. Support apparatus, as above.   Electronically Signed   By: Vinnie Langton M.D.   On: 10/14/2013 14:14   Dg Duanne Limerick W/water Sol Cm  10/15/2013   CLINICAL DATA:  44 year old female with distal gastric cancer status post distal gastrectomy with Roux-en-Y. Postop day 5. Abdomen pain, leukocytosis, query anastomotic leak.  Initial encounter.  EXAM: WATER SOLUBLE UPPER GI SERIES  TECHNIQUE: Single-column upper GI series was performed using water soluble contrast.  CONTRAST:  86mL OMNIPAQUE IOHEXOL 300 MG/ML  SOLN  COMPARISON:  Preoperative CT Abdomen and Pelvis 10/03/2013.  FLUOROSCOPY TIME:  1 min and 16 seconds.  FINDINGS: Preprocedural scout view of the abdomen. NG tube in place in the left upper quadrant. Right abdominal approach postoperative drain. Numerous surgical clips to the right of the spine. Multiple staple lines in the left upper quadrant. Midline abdominal staples. Paucity of bowel gas. Non obstructed bowel gas pattern.  50 mL of Omnipaque 300 was injected into the patient NG tube and followed by 50 mL of water. Filling of the proximal stomach remnant occurred as expected. A small volume of gastroesophageal reflux occurred tracking proximally along the course of the tube. The patient was mildly symptomatic to this.  After a short delay of to min there is faint contrast visible in the jejunostomy loop (series 9-11 arrows). After an additional 15 min delay, more robust opacification of the jejunostomy loop is noted (image 16). No abnormal accumulation of contrast identified. No contrast within the nearby surgical drain.  IMPRESSION: 1. No evidence of anastomotic leak. Slow transit of contrast from the stomach to the jejunostomy loop. 2. Small volume gastroesophageal reflux to which the patient was mildly symptomatic.   Electronically Signed   By: Lars Pinks M.D.   On: 10/15/2013 12:34    Scheduled Meds: . antiseptic oral rinse  15 mL Mouth Rinse q12n4p  . ceFEPime (MAXIPIME) IV  1 g Intravenous 3 times per day  . chlorhexidine  15 mL Mouth Rinse BID  . insulin aspart  0-9 Units Subcutaneous 4 times per day  . methocarbamol (ROBAXIN) IV  1,000 mg Intravenous Q8H  . ondansetron (ZOFRAN) IV  4 mg Intravenous 4 times per day  . pantoprazole (PROTONIX) IV  40 mg Intravenous QHS  . sodium chloride  10-40 mL  Intracatheter Q12H  . vancomycin  750 mg Intravenous Q8H   Continuous Infusions: . sodium chloride 30 mL/hr at 10/14/13 1317  . Marland KitchenTPN (CLINIMIX-E) Adult 80 mL/hr at 10/15/13 1723   And  . fat emulsion 250 mL (10/15/13 1723)

## 2013-10-16 NOTE — Progress Notes (Signed)
Operative findings and pathology report reviewed with the patient. We briefly discussed adjuvant treatment recommendations. She is recovering from the gastrectomy procedure.  Please call oncology as needed. I will check on her 10/20/2013.  We will schedule outpatient followup at the [facility].

## 2013-10-16 NOTE — Progress Notes (Signed)
Physical Therapy Treatment Patient Details Name: Andrea Best MRN: [id number] DOB: 26-Oct-1970 Today's Date: 2013-11-10    History of Present Illness 43 y.o. 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 Pt s/p subtotal gastrectomy with porta hepatis and peripancreatic lymph node dissection with a Roux-en-Y gastrojejunostomy on 10/10/13.     PT Comments    Pt able to increase ambulation distance today.  Follow Up Recommendations  Home health PT;Supervision for mobility/OOB     Equipment Recommendations  Rolling walker with 5" wheels    Recommendations for Other Services       Precautions / Restrictions Precautions Precautions: Fall Precaution Comments: NG tube, R drain    Mobility  Bed Mobility Overal bed mobility: Needs Assistance Bed Mobility: Supine to Sit     Supine to sit: Supervision     General bed mobility comments: supervision for lines  Transfers Overall transfer level: Needs assistance Equipment used: Rolling walker (2 wheeled) Transfers: Sit to/from Stand Sit to Stand: Min guard         General transfer comment: verbal cues for safe technique  Ambulation/Gait Ambulation/Gait assistance: Min guard Ambulation Distance (Feet): 400 Feet Assistive device: Rolling walker (2 wheeled) Gait Pattern/deviations: Step-through pattern;Trunk flexed;Narrow base of support Gait velocity: decr   General Gait Details: improved steadiness today and less need for UE support through RW, occasional short rest breaks due to fatigue   Stairs            Wheelchair Mobility    Modified Rankin (Stroke Patients Only)       Balance                                    Cognition Arousal/Alertness: Awake/alert Behavior During Therapy: WFL for tasks assessed/performed Overall Cognitive Status: Within  Functional Limits for tasks assessed                      Exercises      General Comments        Pertinent Vitals/Pain RN into room to give muscle relaxer prior to session, no pain reported, repositioned.    Home Living                      Prior Function            PT Goals (current goals can now be found in the care plan section) Progress towards PT goals: Progressing toward goals    Frequency  Min 3X/week    PT Plan Current plan remains appropriate    Co-evaluation             End of Session   Activity Tolerance: Patient tolerated treatment well Patient left: in chair;with call bell/phone within reach     Time: 2841-3244 PT Time Calculation (min): 29 min  Charges:  $Gait Training: 23-37 mins                    G Codes:      Faelynn Wynder,KATHrine E Nov 10, 2013, 1:47 PM Carmelia Bake, PT, DPT 11/10/13 Pager: [phone]

## 2013-10-16 NOTE — Progress Notes (Signed)
NUTRITION FOLLOW UP  Intervention:   -Diet advancement per MD -Recommend [employer] supplement TID as tolerated -TPN per pharmacy; consider increase in goal rate and monitor labs -Will continue to monitor  Nutrition Dx:   Inadequate oral intake related to nausea/stomach pains as evidenced by PO intake <75%, 16% body weight loss in 4 months; ongoing, now as evidenced by NPO status  Goal:   TPN to meet >/= 90% of their estimated nutrition needs    Monitor:   Diet order, GI profile, TPN tolerance, total protein/energy intake, labs, weights  Assessment:   7/16: -Pt reported unintentional weight loss of 30 lbs since 05/2013.  -Follows vegetarian diet. Eats vegetables, pasta, eggs, dairy and cheese. Has had appetite for past 4 months; however has had to decrease intake d/t stomach pain and nausea post meals. Diet recall indicates pt tolerating liquid diet for past 2-3 weeks d/t inability to tolerate solid foods. Consumes one Boost supplement daily, has been on regimen for one week. Family prepares smoothies with protein powder for additional nutrition  -Pt tolerating < 50% of clear liquid diet. Denied nausea, having small amount of stomach pains  -Was willing to trial [employer] supplement, provided pt with sample, which she tolerated better with soda. Provided pt with information to continue with supplement upon d/c as tolerated  -Pt to have PICC lined placed for initiation of TPN, RN noted possibly to occur later today.  -K low, Phos slightly elevated.  7/23: -Pt s/p subtotal gastrectomy with porta hepatis and peripancreatic lymph node dissection with a Roux-en-Y gastrojejunostomy on 10/10/13 -NGT with 150 ml output, bowel function has not yet returned. Surgery recommends to continue NPO status -Pharmacy noted pt receiving Clinimix 5/15 at goal rate of 80 ml/hr w/Lipids 20% at 10 ml/hr -This provides 1850 kcal (93% est minimum kcal needs, 84% est maximum kcal needs) and 96 gram  protein (100% est protein needs) -Will recommend to slightly increase TPN rate to meet > 90% est kcal needs; pt with decreased albumin/pre-albumin (also likely r/t to inflammatory process/gastric cancer), and MD noted pt with feelings of hunger/thirst -Phos/Mg WNL, K slightly low at 3.6 -CBG's WNL, <150 mg   Height: Ht Readings from Last 1 Encounters:  10/08/13 5\' 6"  (1.676 m)    Weight Status:   Wt Readings from Last 1 Encounters:  10/13/13 161 lb 13.1 oz (73.4 kg)  10/08/13 154 lbs  Re-estimated needs:  Kcal: 2000-2200  Protein: 85-100 gram  Fluid: >/=2100 ml/daily   Skin: WDL  Diet Order: NPO   Intake/Output Summary (Last 24 hours) at 10/16/13 1159 Last data filed at 10/16/13 0837  Gross per 24 hour  Intake   2090 ml  Output   3380 ml  Net  -1290 ml    Last BM: 7/07- hypoactive BS   Labs:   Recent Labs Lab 10/12/13 0433 10/13/13 0540 10/14/13 0450 10/15/13 0447 10/16/13 0530  NA 131* 134* 130* 134* 134*  K 4.1 4.0 3.7 3.6* 3.6*  CL 99 100 96 97 99  CO2 27 26 25 25 26   BUN 6 6 8 8 7   CREATININE 0.51 0.44* 0.47* 0.46* 0.38*  CALCIUM 8.1* 8.4 8.5 8.6 8.8  MG 1.8 1.7 1.8  --  2.0  PHOS 2.3 2.9  --   --  4.4  GLUCOSE 136* 127* 140* 137* 117*    CBG (last 3)   Recent Labs  10/15/13 1758 10/15/13 2319 10/16/13 0708  GLUCAP 114* 125* 116*    Scheduled  Meds: . acetaminophen  1,000 mg Intravenous 4 times per day  . albuterol  2.5 mg Nebulization Q6H  . antiseptic oral rinse  15 mL Mouth Rinse q12n4p  . ceFEPime (MAXIPIME) IV  1 g Intravenous 3 times per day  . chlorhexidine  15 mL Mouth Rinse BID  . insulin aspart  0-9 Units Subcutaneous 4 times per day  . methocarbamol (ROBAXIN) IV  1,000 mg Intravenous Q8H  . ondansetron (ZOFRAN) IV  4 mg Intravenous 4 times per day  . pantoprazole (PROTONIX) IV  40 mg Intravenous QHS  . sodium chloride  10-40 mL Intracatheter Q12H  . vancomycin  1,000 mg Intravenous 3 times per day    Continuous  Infusions: . sodium chloride 30 mL/hr at 10/16/13 0720  . Marland KitchenTPN (CLINIMIX-E) Adult 80 mL/hr at 10/15/13 1723   And  . fat emulsion 250 mL (10/15/13 1723)  . Marland KitchenTPN (CLINIMIX-E) Adult     And  . fat emulsion      Atlee Abide MS RD LDN Clinical Dietitian [phone]

## 2013-10-16 NOTE — Progress Notes (Signed)
[facility] Progress Note  6 Days Post-Op  Subjective: Pt's very out of it after administration of 2mg  dilaudid.  Says pain is well controlled with addition of robaxin and tylenol.  No flatus or BM yet.  Using IS but only at 750.  Ambulating with PT/OT only.  IS to 750 only.  Pt starting to get hungry/thirsty.    Objective: Vital signs in last 24 hours: Temp:  [99.3 F (37.4 C)-99.5 F (37.5 C)] 99.3 F (37.4 C) (07/23 0709) Pulse Rate:  [122-123] 123 (07/23 0709) Resp:  [18] 18 (07/23 0709) BP: (123-124)/(58-72) 123/72 mmHg (07/23 0709) SpO2:  [97 %-98 %] 97 % (07/23 0709) Last BM Date: 09/30/13  Intake/Output from previous day: 07/22 0701 - 07/23 0700 In: 1770 [I.V.:600; NG/GT:90; IV Piggyback:1080] Out: 3130 [Urine:2975; Emesis/NG output:150; Drains:5] Intake/Output this shift:    PE: Gen:  Alert, NAD, pleasant Abd: Soft, mild tenderness, mild distension, diminished BS, no HSM, midline incision staples in place, dressing C/D/I, drain with minimal light brown clear drainage (56mL/24hr)    Lab Results:   Recent Labs  10/15/13 0447 10/16/13 0530  WBC 18.1* 18.6*  HGB 7.8* 7.6*  HCT 25.0* 24.6*  PLT 205 207   BMET  Recent Labs  10/15/13 0447 10/16/13 0530  NA 134* 134*  K 3.6* 3.6*  CL 97 99  CO2 25 26  GLUCOSE 137* 117*  BUN 8 7  CREATININE 0.46* 0.38*  CALCIUM 8.6 8.8   PT/INR No results found for this basename: LABPROT, INR,  in the last 72 hours CMP     Component Value Date/Time   NA 134* 10/16/2013 0530   K 3.6* 10/16/2013 0530   CL 99 10/16/2013 0530   CO2 26 10/16/2013 0530   GLUCOSE 117* 10/16/2013 0530   BUN 7 10/16/2013 0530   CREATININE 0.38* 10/16/2013 0530   CREATININE 0.63 05/26/2013 1517   CALCIUM 8.8 10/16/2013 0530   PROT 7.0 10/16/2013 0530   ALBUMIN 1.6* 10/16/2013 0530   AST 29 10/16/2013 0530   ALT 19 10/16/2013 0530   ALKPHOS 165* 10/16/2013 0530   BILITOT 0.3 10/16/2013 0530   GFRNONAA >90 10/16/2013 0530   GFRAA >90  10/16/2013 0530   Lipase  No results found for this basename: lipase       Studies/Results: Dg Chest 2 View  10/14/2013   CLINICAL DATA:  Chest pain. Worsening pneumonia. Fever. Shortness of breath.  EXAM: CHEST  2 VIEW  COMPARISON:  Chest x-ray 10/12/2013.  FINDINGS: There is a right upper extremity PICC with tip terminating in the superior aspect of the right atrium. Nasogastric tube in place extending into the mid stomach. Lung volumes are low. Worsening opacification at the base of the left hemithorax, compatible with atelectasis and/or consolidation in the left lower lobe, was superimposed moderate left pleural effusion. Right lung appears clear. No evidence of pulmonary edema. Heart size is within normal limits. Upper mediastinal contours are unremarkable. Numerous surgical clips and skin staples are seen overlying the epigastric region.  IMPRESSION: 1. Worsening atelectasis and/or consolidation in the left lower lobe with increasing moderate left pleural effusion. 2. Support apparatus, as above.   Electronically Signed   By: Vinnie Langton M.D.   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 Postop day 5. Abdomen pain, leukocytosis, query anastomotic leak. Initial encounter.  EXAM: WATER SOLUBLE UPPER GI  SERIES  TECHNIQUE: Single-column upper GI series was performed using water soluble contrast.  CONTRAST:  60mL OMNIPAQUE IOHEXOL 300 MG/ML  SOLN  COMPARISON:  Preoperative CT Abdomen and Pelvis 10/03/2013.  FLUOROSCOPY TIME:  1 min and 16 seconds.  FINDINGS: Preprocedural scout view of the abdomen. NG tube in place in the left upper quadrant. Right abdominal approach postoperative drain. Numerous surgical clips to the right of the spine. Multiple staple lines in the left upper quadrant. Midline abdominal staples. Paucity of bowel gas. Non obstructed bowel gas pattern.  50 mL  of Omnipaque 300 was injected into the patient NG tube and followed by 50 mL of water. Filling of the proximal stomach remnant occurred as expected. A small volume of gastroesophageal reflux occurred tracking proximally along the course of the tube. The patient was mildly symptomatic to this.  After a short delay of to min there is faint contrast visible in the jejunostomy loop (series 9-11 arrows). After an additional 15 min delay, more robust opacification of the jejunostomy loop is noted (image 16). No abnormal accumulation of contrast identified. No contrast within the nearby surgical drain.  IMPRESSION: 1. No evidence of anastomotic leak. Slow transit of contrast from the stomach to the jejunostomy loop. 2. Small volume gastroesophageal reflux to which the patient was mildly symptomatic.   Electronically Signed   By: Lars Pinks M.D.   On: 10/15/2013 12:34    Anti-infectives: Anti-infectives   Start     Dose/Rate Route Frequency Ordered Stop   10/14/13 2200  vancomycin (VANCOCIN) IVPB 750 mg/150 ml premix     750 mg 150 mL/hr over 60 Minutes Intravenous Every 8 hours 10/14/13 1158     10/14/13 1400  ceFEPIme (MAXIPIME) 1 g in dextrose 5 % 50 mL IVPB     1 g 100 mL/hr over 30 Minutes Intravenous 3 times per day 10/14/13 1125 10/22/13 1359   10/14/13 1400  vancomycin (VANCOCIN) 1,500 mg in sodium chloride 0.9 % 500 mL IVPB     1,500 mg 250 mL/hr over 120 Minutes Intravenous  Once 10/14/13 1158 10/14/13 1658   10/12/13 1600  levofloxacin (LEVAQUIN) IVPB 500 mg  Status:  Discontinued     500 mg 100 mL/hr over 60 Minutes Intravenous Every 24 hours 10/12/13 1545 10/14/13 1124   10/10/13 1230  ceFAZolin (ANCEF) IVPB 2 g/50 mL premix     2 g 100 mL/hr over 30 Minutes Intravenous  Once 10/10/13 1220 10/10/13 1222   10/10/13 0600  [MAR Hold]  ceFAZolin (ANCEF) IVPB 2 g/50 mL premix     (On MAR Hold since 10/10/13 0659)   2 g 100 mL/hr over 30 Minutes Intravenous On call to O.R. 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)  POD #6 s/p subtotal gastrectomy with Roux-en-Y gastrojejunostomy & porta hepatis and peripancreatic lymph node dissection  Intractable N/V - resolved  Microcytic anemia/blood loss anemia  Leukocytosis - at 18.6 today  ABL Anemia - down to Hgb 7.6 PCM on TPN  Urinary retention - foley discontinued HCAP   Plan:  -Afebrile, WBC trending back up to 18.6, tachycardic - appears to be pulm source on vanc and cefepime  -Encourage incentive spirometer, SCD's, and would not start heparin or lovenox yet given drop in Hgb/Hct and patients blood administration preferences  -Continue TPN, NPO, IVF, pain control, antiemetics  -Await bowel function prior to advancing diet  -UGI today showed no leak, but no  bowel function yet -PT following, needs to ambulate more!!! - try 6x today -Removed foley yesterday -Decrease dosage/frequence of dilaudid to 1mg  q6h prn, cont robaxin, IV tylenol to help with pain control, orals when we start a diet  -Hgb up to 7.6, Heme consult pending, pt does not desire blood because she is a Jehovah's witness  -Could resume prednisone (solumedrol) which could also make her tachycardic since she is on it long term for her lupus - will leave this up to Dr. Charlies Silvers -Try albuterol     LOS: 8 days    DORT, [facility] 10/16/2013, 7:18 AM Pager: [phone]

## 2013-10-16 NOTE — Progress Notes (Signed)
Pt c/o left chest tightness with out any increased SOB, diaphoresis, radiation or N/V.   VSS. EKG done. Lurlean Leyden text paged with finding. Awaiting return call. Will continue to monitor.

## 2013-10-16 NOTE — Progress Notes (Signed)
ANTIBIOTIC CONSULT NOTE - follow-up  Pharmacy Consult for vancomcyin Indication: HCAP  Allergies  Allergen Reactions  . Azithromycin Rash  . Sulfa Antibiotics Rash  . Hydrocodone-Acetaminophen Nausea And Vomiting    Hallucinating   . 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 SHE DOES NOT WANT ANY BLOOD PRODUCTS OR FRACTIONS    Patient Measurements: Height: 5\' 6"  (167.6 cm) Weight: 161 lb 13.1 oz (73.4 kg) IBW/kg (Calculated) : 59.3 Adjusted Body Weight:   Vital Signs: Temp: 99.3 F (37.4 C) (07/23 0709) Temp src: Oral (07/23 0709) BP: 123/72 mmHg (07/23 0709) Pulse Rate: 123 (07/23 0709) Intake/Output from previous day: 07/22 0701 - 07/23 0700 In: 1770 [I.V.:600; NG/GT:90; IV Piggyback:1080] Out: 9323 [Urine:2975; Emesis/NG output:150; Drains:5] Intake/Output from this shift:    Labs:  Recent Labs  10/14/13 0450 10/15/13 0447 10/16/13 0530  WBC 18.4* 18.1* 18.6*  HGB 8.0* 7.8* 7.6*  PLT 212 205 207  CREATININE 0.47* 0.46* 0.38*   Estimated Creatinine Clearance: 95.8 ml/min (by C-G formula based on Cr of 0.38).  Recent Labs  10/16/13 0530  VANCOTROUGH 9.8*     Microbiology: Recent Results (from the past 720 hour(s))  MRSA PCR SCREENING     Status: None   Collection Time    10/10/13  3:39 PM      Result Value Ref Range Status   MRSA by PCR NEGATIVE  NEGATIVE Final   Comment:            The GeneXpert MRSA Assay (FDA     approved for NASAL specimens     only), is one component of a     comprehensive MRSA colonization     surveillance program. It is not     intended to diagnose MRSA     infection nor to guide or     monitor treatment for     MRSA infections.  CULTURE, BLOOD (ROUTINE X 2)     Status: None   Collection Time    10/14/13 11:50 AM      Result Value Ref Range Status   Specimen Description BLOOD LEFT ARM   Final   Special Requests BOTTLES DRAWN AEROBIC AND ANAEROBIC 5CC EACH   Final   Culture  Setup Time     Final   Value: 10/14/2013 14:17      Performed at [facility]   Culture     Final   Value:        BLOOD CULTURE RECEIVED NO GROWTH TO DATE CULTURE WILL BE HELD FOR 5 DAYS BEFORE ISSUING A FINAL NEGATIVE REPORT     Performed at [facility]   Report Status PENDING   Incomplete  CULTURE, BLOOD (ROUTINE X 2)     Status: None   Collection Time    10/14/13 11:53 AM      Result Value Ref Range Status   Specimen Description BLOOD LEFT HAND   Final   Special Requests BOTTLES DRAWN AEROBIC AND ANAEROBIC 5CC EACH   Final   Culture  Setup Time     Final   Value: 10/14/2013 14:17     Performed at [facility]   Culture     Final   Value:        BLOOD CULTURE RECEIVED NO GROWTH TO DATE CULTURE WILL BE HELD FOR 5 DAYS BEFORE ISSUING A FINAL NEGATIVE REPORT     Performed at [facility]   Report Status PENDING   Incomplete  URINE CULTURE     Status: None   Collection Time    10/14/13 12:09 PM      Result Value Ref Range Status   Specimen Description URINE, CATHETERIZED   Final   Special Requests NONE   Final   Culture  Setup Time     Final   Value: 10/14/2013 17:48     Performed at [place]     Final   Value: NO GROWTH     Performed at [facility]   Culture     Final   Value: NO GROWTH     Performed at [facility]   Report Status 10/15/2013 FINAL   Final  CULTURE, EXPECTORATED SPUTUM-ASSESSMENT     Status: None   Collection Time    10/14/13  2:59 PM      Result Value Ref Range Status   Specimen Description SPUTUM   Final   Special Requests NONE   Final   Sputum evaluation     Final   Value: THIS SPECIMEN IS ACCEPTABLE. RESPIRATORY CULTURE REPORT TO FOLLOW.   Report Status 10/14/2013 FINAL   Final  CULTURE, RESPIRATORY (NON-EXPECTORATED)     Status: None   Collection Time    10/14/13  2:59 PM      Result Value Ref Range Status   Specimen Description SPUTUM   Final   Special Requests NONE   Final   Gram Stain     Final   Value: RARE WBC  PRESENT, PREDOMINANTLY MONONUCLEAR     FEW SQUAMOUS EPITHELIAL CELLS PRESENT     MODERATE GRAM POSITIVE COCCI IN PAIRS     IN CLUSTERS FEW GRAM POSITIVE RODS     RARE GRAM NEGATIVE RODS   Culture     Final   Value: Culture reincubated for better growth     Performed at [facility]   Report Status PENDING   Incomplete    Medical History: Past Medical History  Diagnosis Date  . Asthma   . Eczema   . Lupus   . Sjogren's disease    Assessment: 5 YOF with Gastric cancer status post upper endoscopy 09/22/2013 with findings of a partially obstructing oozing cratered gastric ulcer in the gastric antrum. 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  Started of levofloxacin for possible pneumonia 7/19, orders to broaden to vancomycin/cefepime  7/19 >> levofloxacin >> 7/21 7/21 >> vancomycin  >> 7/21 >> cefepime  >>    Tmax: afeb WBCs: elevated/unchanged Renal: Scr low, normalized CrCl > 131ml/min  7/21 blood: NG 7/21 urine: NG 7/21 sputum: re-incubated  Drug level / dose changes info: 7/23: 0530 VT = 9.22mcg/ml on 750mg  IV q8h (prior to 5th maintenance dose)  Goal of Therapy:  Vancomycin trough level 15-20 mcg/ml  Plan:  Day #3 vancomycin/cefepime  Increase vancomycin 1gm IV q8h (expect new VT to be ~56mcg/ml)  Continue cefepime 1gm IV q8h as ordered by [facility]  Monitor renal function and culture results  Doreene Eland, PharmD, BCPS.   Pager: [phone]  10/16/2013,7:22 AM

## 2013-10-16 NOTE — Progress Notes (Signed)
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 Azithromycin Rash  . Sulfa Antibiotics Rash  . Hydrocodone-Acetaminophen Nausea And Vomiting    Hallucinating   . 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 SHE DOES NOT WANT ANY BLOOD PRODUCTS OR FRACTIONS   Patient Measurements: Height: 5\' 6"  (167.6 cm) Weight: 161 lb 13.1 oz (73.4 kg) IBW/kg (Calculated) : 59.3  Vital Signs: Temp: 99.3 F (37.4 C) (07/23 0709) Temp src: Oral (07/23 0709) BP: 123/72 mmHg (07/23 0709) Pulse Rate: 123 (07/23 0709) Intake/Output from previous day: 07/22 0701 - 07/23 0700 In: 1770 [I.V.:600; NG/GT:90; IV Piggyback:1080] Out: 3130 [Urine:2975; Emesis/NG output:150; Drains:5] Intake/Output from this shift:    Labs:  Recent Labs  10/14/13 0450 10/15/13 0447 10/16/13 0530  WBC 18.4* 18.1* 18.6*  HGB 8.0* 7.8* 7.6*  HCT 25.9* 25.0* 24.6*  PLT 212 205 207    Recent Labs  10/14/13 0450 10/15/13 0447 10/16/13 0530  NA 130* 134* 134*  K 3.7 3.6* 3.6*  CL 96 97 99  CO2 25 25 26   GLUCOSE 140* 137* 117*  BUN 8 8 7   CREATININE 0.47* 0.46* 0.38*  CALCIUM 8.5 8.6 8.8  MG 1.8  --  2.0  PHOS  --   --  4.4  PROT 6.8  --  7.0  ALBUMIN 1.6*  --  1.6*  AST 15  --  29  ALT 10  --  19  ALKPHOS 85  --  165*  BILITOT <0.2*  --  0.3   Estimated Creatinine Clearance: 95.8 ml/min (by C-G formula based on Cr of 0.38).    Recent Labs  10/15/13 1758 10/15/13 2319 10/16/13 0708  GLUCAP 114* 125* 116*   Medications:  Scheduled:  . antiseptic oral rinse  15 mL Mouth Rinse q12n4p  . ceFEPime (MAXIPIME) IV  1 g Intravenous 3 times per day  . chlorhexidine  15 mL Mouth Rinse BID  . insulin aspart  0-9 Units Subcutaneous 4 times per day  . methocarbamol (ROBAXIN) IV  1,000 mg Intravenous Q8H  . ondansetron (ZOFRAN) IV  4 mg Intravenous 4 times per day  . pantoprazole (PROTONIX) IV  40 mg Intravenous QHS  .  sodium chloride  10-40 mL Intracatheter Q12H  . vancomycin  1,000 mg Intravenous 3 times per day   Infusions:  . sodium chloride 30 mL/hr at 10/16/13 0720  . Marland KitchenTPN (CLINIMIX-E) Adult 80 mL/hr at 10/15/13 1723   And  . fat emulsion 250 mL (10/15/13 1723)   Insulin Requirements in the past 24 hours:  1 units SSI/24h, CBGs controlled Novolog sensitive scale q4hr  Current Nutrition, IV fluids:  NPO NS at 30 ml/hr  Assessment: 37 YOF presents with N/V, weight loss d/t difficulty eating with recent dx of Gastric cancer. EGD on 09/22/2013 reveald findings of a partially obstructing oozing cratered gastric ulcer in the gastric antrum. 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 TPN started 7/16 for GOO. Surgery consulted, s/p subtotal gastrectomy with gastric jejunostomy on 7/17.  UGI series 7/22 revealed: No evidence of anastomotic leak. Slow transit of contrast from the stomach to the jejunostomy loop.    Electrolytes: Corrected Ca = 10.72 (sl elevated), Phos = 4.4 (Ca x Phos = 47, goal < 55)  Na = 134 (improved), K = 3.6  Renal: SCr WNL, I/O = - 1.3L, NGT = 139ml out (improved), drain  82ml out  LFT's: WNL   Glucose control: at goal of  150mg /dl, no hypoglycemia  Triglycerides: 146 (7/20), 94 (7/17), - increased, will continue to monitor    Pre-Albumin: 3.1 (7/20), 7.9 (7/17) - reflects depleted protein stores  Access: PICC placed 7/16 Day #8 TNA  Nutritional Goals:  RD consulted: Kcal: 2000-2200  Protein: 85-100 gram  Fluid: >/=2100 ml/daily  Clinimix 5/15 at 80 ml/hr + Lipids 20% at 10 ml/hr would deliver: 1850 kCal per day 96 grams of protein per day  Plan: at 1800 today  Continue Clinimix E 5/15 at goal rate 80 ml/hr  Hyponatremia - likely from free fluid in TPN - unable to change adjust electrolytes with clinimix product  Prealbumin decreased from previous value but does not reflect TPN at goal rate  Continue Lipids 20% at 10  ml/hr  Full TNA labs Monday/Thursday  MVI and Trace elements daily  Continue SSI at q6h   Doreene Eland, PharmD, BCPS.   Pager: [phone] 10/16/2013. 7:32 AM

## 2013-10-17 ENCOUNTER — Telehealth: Payer: Self-pay | Admitting: *Deleted

## 2013-10-17 LAB — GLUCOSE, CAPILLARY
GLUCOSE-CAPILLARY: 108 mg/dL — AB (ref 70–99)
Glucose-Capillary: 123 mg/dL — ABNORMAL HIGH (ref 70–99)
Glucose-Capillary: 124 mg/dL — ABNORMAL HIGH (ref 70–99)
Glucose-Capillary: 148 mg/dL — ABNORMAL HIGH (ref 70–99)

## 2013-10-17 LAB — BASIC METABOLIC PANEL
Anion gap: 11 (ref 5–15)
BUN: 8 mg/dL (ref 6–23)
CHLORIDE: 98 meq/L (ref 96–112)
CO2: 24 meq/L (ref 19–32)
CREATININE: 0.44 mg/dL — AB (ref 0.50–1.10)
Calcium: 8.6 mg/dL (ref 8.4–10.5)
GFR calc Af Amer: >90 mL/min (ref >90)
GFR calc non Af Amer: >90 mL/min (ref >90)
Glucose, Bld: 138 mg/dL — ABNORMAL HIGH (ref 70–99)
Potassium: 3.8 mEq/L (ref 3.7–5.3)
Sodium: 133 mEq/L — ABNORMAL LOW (ref 137–147)

## 2013-10-17 LAB — TROPONIN I: Troponin I: <0.3 ng/mL (ref <0.30)

## 2013-10-17 LAB — VANCOMYCIN, TROUGH: Vancomycin Tr: 13.1 ug/mL (ref 10.0–20.0)

## 2013-10-17 LAB — CBC
HEMATOCRIT: 23.2 % — AB (ref 36.0–46.0)
HEMOGLOBIN: 7.2 g/dL — AB (ref 12.0–15.0)
MCH: 22.8 pg — AB (ref 26.0–34.0)
MCHC: 31 g/dL (ref 30.0–36.0)
MCV: 73.4 fL — ABNORMAL LOW (ref 78.0–100.0)
Platelets: 248 10*3/uL (ref 150–400)
RBC: 3.16 MIL/uL — ABNORMAL LOW (ref 3.87–5.11)
RDW: 19.5 % — ABNORMAL HIGH (ref 11.5–15.5)
WBC: 16.1 10*3/uL — ABNORMAL HIGH (ref 4.0–10.5)

## 2013-10-17 MED ORDER — TRACE MINERALS CR-CU-F-FE-I-MN-MO-SE-ZN IV SOLN
INTRAVENOUS | Status: AC
  Administered 2013-10-17: 18:00:00 via INTRAVENOUS
  Filled 2013-10-17: qty 2000

## 2013-10-17 MED ORDER — HYDROMORPHONE HCL PF 1 MG/ML IJ SOLN
1.0000 mg | INTRAMUSCULAR | Status: DC | PRN
  Administered 2013-10-17 – 2013-10-20 (×13): 1 mg via INTRAVENOUS
  Filled 2013-10-17 (×13): qty 1

## 2013-10-17 MED ORDER — METOCLOPRAMIDE HCL 5 MG/ML IJ SOLN
5.0000 mg | Freq: Three times a day (TID) | INTRAMUSCULAR | Status: DC
  Administered 2013-10-17 – 2013-10-25 (×26): 5 mg via INTRAVENOUS
  Administered 2013-10-26: 22:00:00 via INTRAVENOUS
  Administered 2013-10-26 – 2013-10-27 (×3): 5 mg via INTRAVENOUS
  Filled 2013-10-17 (×6): qty 1
  Filled 2013-10-17: qty 2
  Filled 2013-10-17: qty 1
  Filled 2013-10-17: qty 2
  Filled 2013-10-17 (×16): qty 1
  Filled 2013-10-17: qty 2
  Filled 2013-10-17 (×8): qty 1
  Filled 2013-10-17: qty 2
  Filled 2013-10-17 (×2): qty 1

## 2013-10-17 MED ORDER — VANCOMYCIN HCL 10 G IV SOLR
1250.0000 mg | Freq: Three times a day (TID) | INTRAVENOUS | Status: DC
  Administered 2013-10-17 – 2013-10-19 (×5): 1250 mg via INTRAVENOUS
  Filled 2013-10-17 (×6): qty 1250

## 2013-10-17 MED ORDER — FAT EMULSION 20 % IV EMUL
250.0000 mL | INTRAVENOUS | Status: AC
  Administered 2013-10-17: 250 mL via INTRAVENOUS
  Filled 2013-10-17: qty 250

## 2013-10-17 NOTE — Progress Notes (Signed)
2 bm's this afternoon. Will try clamping ng and see how she does

## 2013-10-17 NOTE — Progress Notes (Signed)
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 Azithromycin Rash  . Sulfa Antibiotics Rash  . Hydrocodone-Acetaminophen Nausea And Vomiting    Hallucinating   . 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 SHE DOES NOT WANT ANY BLOOD PRODUCTS OR FRACTIONS   Andrea Best Measurements: Height: 5\' 6"  (167.6 cm) Weight: 161 lb 13.1 oz (73.4 kg) IBW/kg (Calculated) : 59.3  Vital Signs: Temp: 98.3 F (36.8 C) (07/23 2156) Temp src: Oral (07/23 2156) BP: 111/52 mmHg (07/23 2156) Pulse Rate: 121 (07/23 2156) Intake/Output from previous day: 07/23 0701 - 07/24 0700 In: 3468 [P.O.:1500; I.V.:718; NG/GT:60; IV Piggyback:1070] Out: 3500 [Urine:3500] Intake/Output from this shift:    Labs:  Recent Labs  10/15/13 0447 10/16/13 0530 10/17/13 0620  WBC 18.1* 18.6* 16.1*  HGB 7.8* 7.6* 7.2*  HCT 25.0* 24.6* 23.2*  PLT 205 207 248    Recent Labs  10/15/13 0447 10/16/13 0530 10/17/13 0620  NA 134* 134* 133*  K 3.6* 3.6* 3.8  CL 97 99 98  CO2 25 26 24   GLUCOSE 137* 117* 138*  BUN 8 7 8   CREATININE 0.46* 0.38* 0.44*  CALCIUM 8.6 8.8 8.6  MG  --  2.0  --   PHOS  --  4.4  --   PROT  --  7.0  --   ALBUMIN  --  1.6*  --   AST  --  29  --   ALT  --  19  --   ALKPHOS  --  165*  --   BILITOT  --  0.3  --    Estimated Creatinine Clearance: 95.8 ml/min (by C-G formula based on Cr of 0.44).    Recent Labs  10/16/13 1743 10/16/13 2355 10/17/13 0637  GLUCAP 109* 141* 108*   Medications:  Scheduled:  . albuterol  2.5 mg Nebulization TID  . antiseptic oral rinse  15 mL Mouth Rinse q12n4p  . ceFEPime (MAXIPIME) IV  1 g Intravenous 3 times per day  . chlorhexidine  15 mL Mouth Rinse BID  . insulin aspart  0-9 Units Subcutaneous 4 times per day  . methocarbamol (ROBAXIN) IV  1,000 mg Intravenous Q8H  . ondansetron (ZOFRAN) IV  4 mg Intravenous 4 times per day  . pantoprazole (PROTONIX) IV  40  mg Intravenous QHS  . sodium chloride  10-40 mL Intracatheter Q12H  . vancomycin  1,000 mg Intravenous 3 times per day   Infusions:  . sodium chloride 30 mL/hr at 10/17/13 0514  . Marland KitchenTPN (CLINIMIX-E) Adult 80 mL/hr at 10/16/13 1812   And  . fat emulsion 250 mL (10/16/13 1812)   Insulin Requirements in the past 24 hours:  1 units SSI/24h, CBGs controlled Novolog sensitive scale q4hr  Current Nutrition, IV fluids:  NPO NS at 30 ml/hr  Assessment: 53 YOF presents with N/V, weight loss d/t difficulty eating with recent dx of Gastric cancer. EGD on 09/22/2013 reveald findings of a partially obstructing oozing cratered gastric ulcer in the gastric antrum. 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 TPN started 7/16 for GOO. Surgery consulted, s/p subtotal gastrectomy with gastric jejunostomy on 7/17.  UGI series 7/22 revealed: No evidence of anastomotic leak. Slow transit of contrast from the stomach to the jejunostomy loop.    Electrolytes: Corrected Ca = 10.52 (sl elevated), Phos = 4.4 (7/23) (Ca x Phos = 47, goal < 55)  Na = 133 (low), K = 3.8  Renal: SCr WNL, NGT = 80ml out (improved)  LFT's: WNL   Glucose control: at goal of  150mg /dl, no hypoglycemia  Triglycerides: 146 (7/20), 94 (7/17), - increased, will continue to monitor    Pre-Albumin: 3.1 (7/20), 7.9 (7/17) - reflects depleted protein stores  Access: PICC placed 7/16 Day #9 TNA  Nutritional Goals:  RD consulted: Kcal: 2000-2200  Protein: 85-100 gram  Fluid: >/=2100 ml/daily  Clinimix 5/15 at 80 ml/hr + Lipids 20% at 10 ml/hr would deliver: 1850 kCal per day 96 grams of protein per day Per Dietitian recommendations and pt noting feelings of hunger/thirst, agree to increase kCal and adjust TPN accordingly    Plan: at 1800 today  Switch Andrea Best to Clinimix E 5/20 at goal rate 80 ml/hr to provide 2169 kCal and 96g protein.  Hyponatremia - likely from free fluid in TPN - unable  to change adjust electrolytes with clinimix product  Prealbumin decreased from previous value but does not reflect TPN at goal rate  Continue Lipids 20% at 10 ml/hr  Full TNA labs Monday/Thursday  MVI and Trace elements daily  Continue SSI at q6h   [name], PharmD Pager: [phone] 10/17/2013 8:19 AM

## 2013-10-17 NOTE — Progress Notes (Addendum)
Patient ID: Andrea Best, female   DOB: [birth year], 43 y.o.   MRN: [id number] [facility] PROGRESS NOTE  [name] HYQ:[id number] DOB: 07-Sep-1970 DOA: 10/08/2013 PCP: Conni Slipper, MD  Brief narrative: 43 y.o. 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 She underwent subtotal gastrectomy with porta hepatis and peripancreatic lymph node dissection with a Roux-en-Y gastrojejunostomy on 10/10/13.   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 Was in SDU post-op. Remains stable on medical floor. Still has NG tube until bowel function improves. Needs to ambulate more. Appreciate Dr. Benay Spice recommendations for consideration of adjuvant treatment options. Surgery following as well.  Continue TPN, n.p.o., pain control and antiemetics as needed Active Problems:  Postoperative Fever/tachycardia / HCAP  CXR was suggestive of developing PNA. Pt started on Levaquin but this was changed to maxipime and vanco on 10/14/13. Hypomagnesemia / hypophosphatemia /hypokalemia  Replete as needed. 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  Hemoglobin ranges 7.2-8.0  Protein-calorie malnutrition, severe  Pt meets criteria for severe MALNUTRITION in the context of chronic illness as evidenced by PO intake < 75% for > one month, 16% body weight loss in 4 months.  PICC line placed for TNA administration. Lupus/Sjogren syndrome  Plaquenil and prednisone currently on hold. Will  resume once PO intake starts. DVT Prophylaxis  Hold heparin or Lovenox due to drop in Hg    Code Status: Full.  Family Communication: Mother updated at the bedside 10/09/13, no family currently at the bedside.  Disposition Plan: home when stable    IV access:  PICC placed 10/09/13. Consultants:  Dr. Betsy Coder, Oncology.  Dr. Excell Seltzer, Surgery Other consultants:  [place] Procedures:  Chest x-ray 10/08/13: No active disease.  Subtotal gastrectomy with porta hepatis and peripancreatic lymph node dissection, Roux-en-Y gastrojejunostomy 10/10/13 by Dr. Excell Seltzer.  Chest x-ray 10/12/13: ? Developing PNA Antibiotics:  Levaquin 7/19 --> 7/21  Maxipime 7/21 -->  Vancomycin 7/21 -->   [name], MD  [facility] Pager [phone]  If 7PM-7AM, please contact night-coverage [email or web address] Password [email or web address] 10/17/2013, 2:08 PM   LOS: 9 days    HPI/Subjective: No acute overnight events.  Objective: Filed Vitals:   10/16/13 1414 10/16/13 2056 10/16/13 2156 10/17/13 0804  BP:   111/52   Pulse:  120 121   Temp:   98.3 F (36.8 C)   TempSrc:   Oral   Resp:  18 18   Height:      Weight:      SpO2: 98% 98% 100% 96%    Intake/Output Summary (Last 24 hours) at 10/17/13 1408 Last data filed at 10/17/13 1145  Gross per 24 hour  Intake   3528 ml  Output   3150 ml  Net    378 ml    Exam:   General:  Pt is alert, follows commands appropriately, not in acute distress  Cardiovascular: Regular rate and rhythm, S1/S2, no murmurs  Respiratory: Clear to auscultation bilaterally, no wheezing  Abdomen: Soft, non tender, non distended, bowel sounds present; NG tube in place  Extremities: No edema,  pulses DP and PT palpable bilaterally  Neuro: Grossly nonfocal  Data Reviewed: Basic Metabolic Panel:  Recent Labs Lab 10/11/13 0420 10/12/13 0433 10/13/13 0540 10/14/13 0450 10/15/13 0447 10/16/13 0530 10/17/13 0620  NA 133* 131* 134* 130*  134* 134* 133*  K 4.2 4.1 4.0 3.7 3.6* 3.6* 3.8  CL 99 99 100 96 97 99 98  CO2 25 27 26 25 25 26 24   GLUCOSE 134* 136* 127* 140* 137* 117* 138*  BUN 8 6 6 8 8 7 8   CREATININE 0.51 0.51 0.44* 0.47* 0.46* 0.38* 0.44*  CALCIUM 8.2* 8.1* 8.4 8.5 8.6 8.8 8.6  MG 1.4* 1.8 1.7 1.8  --  2.0  --   PHOS 4.8* 2.3 2.9  --   --  4.4  --    Liver Function Tests:  Recent Labs Lab 10/11/13 0420 10/13/13 0540 10/14/13 0450 10/16/13 0530  AST 33 22 15 29   ALT 21 12 10 19   ALKPHOS 42 67 85 165*  BILITOT 0.2* 0.2* <0.2* 0.3  PROT 6.4 6.2 6.8 7.0  ALBUMIN 2.0* 1.6* 1.6* 1.6*   No results found for this basename: LIPASE, AMYLASE,  in the last 168 hours No results found for this basename: AMMONIA,  in the last 168 hours CBC:  Recent Labs Lab 10/13/13 0540 10/14/13 0450 10/15/13 0447 10/16/13 0530 10/17/13 0620  WBC 17.2* 18.4* 18.1* 18.6* 16.1*  NEUTROABS 14.5*  --   --   --   --   HGB 7.7* 8.0* 7.8* 7.6* 7.2*  HCT 25.1* 25.9* 25.0* 24.6* 23.2*  MCV 72.8* 73.4* 72.9* 73.2* 73.4*  PLT 161 212 205 207 248   Cardiac Enzymes:  Recent Labs Lab 10/16/13 2230 10/17/13 0620  TROPONINI <0.30 <0.30   BNP: No components found with this basename: POCBNP,  CBG:  Recent Labs Lab 10/16/13 0708 10/16/13 1156 10/16/13 1743 10/16/13 2355 10/17/13 0637  GLUCAP 116* 127* 109* 141* 108*    MRSA PCR SCREENING     Status: None   Collection Time    10/10/13  3:39 PM      Result Value Ref Range Status   MRSA by PCR NEGATIVE  NEGATIVE Final  CULTURE, BLOOD (ROUTINE X 2)     Status: None   Collection Time    10/14/13 11:50 AM      Result Value Ref Range Status   Specimen Description BLOOD LEFT ARM   Final   Value:        BLOOD CULTURE RECEIVED NO GROWTH TO DATE CULTURE WILL BE HELD FOR 5 DAYS BEFORE ISSUING A FINAL NEGATIVE REPORT     Performed at [facility]   Report Status PENDING   Incomplete  CULTURE, BLOOD (ROUTINE X 2)     Status: None   Collection Time    10/14/13 11:53  AM      Result Value Ref Range Status   Specimen Description BLOOD LEFT HAND   Final   Value:        BLOOD CULTURE RECEIVED NO GROWTH TO DATE CULTURE WILL BE HELD FOR 5 DAYS BEFORE ISSUING A FINAL NEGATIVE REPORT     Performed at [facility]   Report Status PENDING   Incomplete  URINE CULTURE     Status: None   Collection Time    10/14/13 12:09 PM      Result Value Ref Range Status   Specimen Description URINE, CATHETERIZED   Final   Value: NO GROWTH     Performed  at [facility]   Report Status 10/15/2013 FINAL   Final  CULTURE, EXPECTORATED SPUTUM-ASSESSMENT     Status: None   Collection Time    10/14/13  2:59 PM      Result Value Ref Range Status   Specimen Description SPUTUM   Final   Special Requests NONE   Final   Sputum evaluation     Final   Value: THIS SPECIMEN IS ACCEPTABLE. RESPIRATORY CULTURE REPORT TO FOLLOW.   Report Status 10/14/2013 FINAL   Final  CULTURE, RESPIRATORY (NON-EXPECTORATED)     Status: None   Collection Time    10/14/13  2:59 PM      Result Value Ref Range Status   Specimen Description SPUTUM   Final   Special Requests NONE   Final   Gram Stain     Final   Value: RARE WBC PRESENT, PREDOMINANTLY MONONUCLEAR     FEW SQUAMOUS EPITHELIAL CELLS PRESENT     MODERATE GRAM POSITIVE COCCI IN PAIRS     IN CLUSTERS FEW GRAM POSITIVE RODS     RARE GRAM NEGATIVE RODS   Culture     Final   Value: NORMAL OROPHARYNGEAL FLORA     Performed at [facility]   Report Status 10/16/2013 FINAL   Final     Studies: No results found.  Scheduled Meds: . albuterol  2.5 mg Nebulization TID  . antiseptic oral rinse  15 mL Mouth Rinse q12n4p  . ceFEPime (MAXIPIME) IV  1 g Intravenous 3 times per day  . chlorhexidine  15 mL Mouth Rinse BID  . insulin aspart  0-9 Units Subcutaneous 4 times per day  . methocarbamol (ROBAXIN) IV  1,000 mg Intravenous Q8H  . ondansetron (ZOFRAN) IV  4 mg Intravenous 4 times per day  . pantoprazole (PROTONIX) IV   40 mg Intravenous QHS  . sodium chloride  10-40 mL Intracatheter Q12H  . vancomycin  1,000 mg Intravenous 3 times per day   Continuous Infusions: . sodium chloride 30 mL/hr at 10/17/13 0514  . Marland KitchenTPN (CLINIMIX-E) Adult 80 mL/hr at 10/16/13 1812   And  . fat emulsion 250 mL (10/16/13 1812)  . Marland KitchenTPN (CLINIMIX-E) Adult     And  . fat emulsion

## 2013-10-17 NOTE — Progress Notes (Signed)
Dyanne Carrel, NP  returned page. New orders placed in Epic and followed out. Dilaudid 1 mg IV given at 2208 and pt is now pain free. Will continue to monitor.

## 2013-10-17 NOTE — Progress Notes (Signed)
CARE MANAGEMENT NOTE 10/17/2013  Patient:  Andrea Best, Andrea Best   Account Number:  [id number]  Date Initiated:  10/09/2013  Documentation initiated by:  [facility]  Subjective/Objective Assessment:   adm:  Stomach cancer with obstruction of the outflow tract of the stomach causing nausea and vomiting     Action/Plan:   Anticipated DC Date:  10/16/2013   Anticipated DC Plan:  [place]  CM consult      [facility] Choice  HOME HEALTH   Choice offered to / List presented to:  C-1 Patient        [place] arranged  HH-1 RN      [place].   Status of service:  Completed, signed off Medicare Important Message given?  NA - LOS <3 / Initial given by admissions (If response is "NO", the following Medicare IM given date fields will be blank) Date Medicare IM given:   Medicare IM given by:   Date Additional Medicare IM given:   Additional Medicare IM given by:    Discharge Disposition:  [place]  Per UR Regulation:  Reviewed for med. necessity/level of care/duration of stay  If discussed at [place] of Stay Meetings, dates discussed:   10/16/2013    Comments:  10/17/13 MMcGibboney, RN, BSN Pt selected [place] for [place]. Referral given to in house rep.  10/13/2013/[name],RN,BSn,CCM: pt moving out of the icu to med surg floor/iv flds,iv tpn,goo surg.post op day 3/npo, no bowel sounds as of yet.  10/09/13 15:55 CM notes consult for med asst.  Pt has [employer] insurance and, unfortunately, is not eligible for med asst programs (for uninsured).  If a specific medication is ordered upon discharge which is new on the market, sometimes a discount card may be available.  CM will continue to follow for disposiiton needs.  [name], BSN, CM [phone].

## 2013-10-17 NOTE — Progress Notes (Signed)
Pt resting comfortable in bed. Denies pain at this time. Will continue to monitor.

## 2013-10-17 NOTE — Progress Notes (Signed)
[facility] Progress Note  7 Days Post-Op  Subjective: Abdominal pain improved, no N/V.  IS more consistently at 750.  Says breathing treatments are helping.  OOB to bathroom, and only did one lap yesterday, refused getting up yesterday with nurses multiple times.    Objective: Vital signs in last 24 hours: Temp:  [98.3 F (36.8 C)-99.7 F (37.6 C)] 98.3 F (36.8 C) (07/23 2156) Pulse Rate:  [120-131] 121 (07/23 2156) Resp:  [18] 18 (07/23 2156) BP: (111-114)/(52-53) 111/52 mmHg (07/23 2156) SpO2:  [98 %-100 %] 100 % (07/23 2156) Last BM Date: 09/30/13  Intake/Output from previous day: 07/23 0701 - 07/24 0700 In: 3468 [P.O.:1500; I.V.:718; NG/GT:60; IV Piggyback:1070] Out: 3500 [Urine:3500] Intake/Output this shift:    PE: Gen:  Alert, NAD, pleasant Abd: Soft, mild tenderness, mild distension, diminished BS, no HSM, midline incision staples in place, dressing C/D/I, drain with minimal tan/yellow drainage (98mL/24hr)   Lab Results:   Recent Labs  10/16/13 0530 10/17/13 0620  WBC 18.6* 16.1*  HGB 7.6* 7.2*  HCT 24.6* 23.2*  PLT 207 248   BMET  Recent Labs  10/16/13 0530 10/17/13 0620  NA 134* 133*  K 3.6* 3.8  CL 99 98  CO2 26 24  GLUCOSE 117* 138*  BUN 7 8  CREATININE 0.38* 0.44*  CALCIUM 8.8 8.6   PT/INR No results found for this basename: LABPROT, INR,  in the last 72 hours CMP     Component Value Date/Time   NA 133* 10/17/2013 0620   K 3.8 10/17/2013 0620   CL 98 10/17/2013 0620   CO2 24 10/17/2013 0620   GLUCOSE 138* 10/17/2013 0620   BUN 8 10/17/2013 0620   CREATININE 0.44* 10/17/2013 0620   CREATININE 0.63 05/26/2013 1517   CALCIUM 8.6 10/17/2013 0620   PROT 7.0 10/16/2013 0530   ALBUMIN 1.6* 10/16/2013 0530   AST 29 10/16/2013 0530   ALT 19 10/16/2013 0530   ALKPHOS 165* 10/16/2013 0530   BILITOT 0.3 10/16/2013 0530   GFRNONAA >90 10/17/2013 0620   GFRAA >90 10/17/2013 0620   Lipase  No results found for this basename: lipase        Studies/Results: Dg Ugi W/water Sol Cm  10/15/2013   CLINICAL DATA:  43 year old female with distal gastric cancer status post distal gastrectomy with Roux-en-Y. Postop day 5. Abdomen pain, leukocytosis, query anastomotic leak. Initial encounter.  EXAM: WATER SOLUBLE UPPER GI SERIES  TECHNIQUE: Single-column upper GI series was performed using water soluble contrast.  CONTRAST:  70mL OMNIPAQUE IOHEXOL 300 MG/ML  SOLN  COMPARISON:  Preoperative CT Abdomen and Pelvis 10/03/2013.  FLUOROSCOPY TIME:  1 min and 16 seconds.  FINDINGS: Preprocedural scout view of the abdomen. NG tube in place in the left upper quadrant. Right abdominal approach postoperative drain. Numerous surgical clips to the right of the spine. Multiple staple lines in the left upper quadrant. Midline abdominal staples. Paucity of bowel gas. Non obstructed bowel gas pattern.  50 mL of Omnipaque 300 was injected into the patient NG tube and followed by 50 mL of water. Filling of the proximal stomach remnant occurred as expected. A small volume of gastroesophageal reflux occurred tracking proximally along the course of the tube. The patient was mildly symptomatic to this.  After a short delay of to min there is faint contrast visible in the jejunostomy loop (series 9-11 arrows). After an additional 15 min delay, more robust opacification of the jejunostomy loop is noted (image 16). No abnormal accumulation  of contrast identified. No contrast within the nearby surgical drain.  IMPRESSION: 1. No evidence of anastomotic leak. Slow transit of contrast from the stomach to the jejunostomy loop. 2. Small volume gastroesophageal reflux to which the patient was mildly symptomatic.   Electronically Signed   By: Lars Pinks M.D.   On: 10/15/2013 12:34    Anti-infectives: Anti-infectives   Start     Dose/Rate Route Frequency Ordered Stop   10/16/13 0830  vancomycin (VANCOCIN) IVPB 1000 mg/200 mL premix     1,000 mg 200 mL/hr over 60 Minutes  Intravenous 3 times per day 10/16/13 0730     10/14/13 2200  vancomycin (VANCOCIN) IVPB 750 mg/150 ml premix  Status:  Discontinued     750 mg 150 mL/hr over 60 Minutes Intravenous Every 8 hours 10/14/13 1158 10/16/13 0730   10/14/13 1400  ceFEPIme (MAXIPIME) 1 g in dextrose 5 % 50 mL IVPB     1 g 100 mL/hr over 30 Minutes Intravenous 3 times per day 10/14/13 1125 10/22/13 1359   10/14/13 1400  vancomycin (VANCOCIN) 1,500 mg in sodium chloride 0.9 % 500 mL IVPB     1,500 mg 250 mL/hr over 120 Minutes Intravenous  Once 10/14/13 1158 10/14/13 1658   10/12/13 1600  levofloxacin (LEVAQUIN) IVPB 500 mg  Status:  Discontinued     500 mg 100 mL/hr over 60 Minutes Intravenous Every 24 hours 10/12/13 1545 10/14/13 1124   10/10/13 1230  ceFAZolin (ANCEF) IVPB 2 g/50 mL premix     2 g 100 mL/hr over 30 Minutes Intravenous  Once 10/10/13 1220 10/10/13 1222   10/10/13 0600  [MAR Hold]  ceFAZolin (ANCEF) IVPB 2 g/50 mL premix     (On MAR Hold since 10/10/13 0659)   2 g 100 mL/hr over 30 Minutes Intravenous On call to O.R. 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)  POD #7 s/p subtotal gastrectomy with Roux-en-Y gastrojejunostomy & porta hepatis and peripancreatic lymph node dissection  Intractable N/V - resolved  Microcytic anemia/blood loss anemia  Leukocytosis - at 16.1 today  ABL Anemia - down to Hgb 7.6  PCM on TPN  Urinary retention - resolved HCAP   Plan:  -Afebrile, WBC trending back up to 16.1, tachycardic - appears to be pulm source on vanc and cefepime  -Encourage incentive spirometer, SCD's, and would not start heparin or lovenox yet given drop in Hgb/Hct and patients blood administration preferences  -Continue TPN, NPO, IVF, pain control, antiemetics  -Await bowel function prior to advancing diet  -UGI today showed no leak, but no bowel function yet  -PT following, needs to ambulate more!!! - try 6x today   -Decrease dosage/frequence of dilaudid to 1mg  q5h prn, cont robaxin, IV tylenol to help with pain control, orals when we start a diet  -Hgb 7.2 today, pt does not desire blood because she is a Jehovah's witness  -Could resume prednisone (solumedrol) which could also make her tachycardic since she is on it long term for her lupus - will leave this up to Dr. Charlies Silvers -Cont albuterol     LOS: 9 days    DORT, Brayleigh Rybacki 10/17/2013, 7:30 AM Pager: [phone]

## 2013-10-17 NOTE — Progress Notes (Signed)
Physical Therapy Treatment Patient Details Name: Andrea Best MRN: [id number] DOB: Jul 30, 1970 Today's Date: 11-14-13    History of Present Illness 43 y.o. 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 Pt s/p subtotal gastrectomy with porta hepatis and peripancreatic lymph node dissection with a Roux-en-Y gastrojejunostomy on 10/10/13.     PT Comments    Pt ambulated good distance in hallway.  Left NG tube clamped per RN request.  Pt performed a couple exercises in recliner however ended session so pt could interact with visitors.  Follow Up Recommendations  Home health PT;Supervision for mobility/OOB     Equipment Recommendations  Rolling walker with 5" wheels    Recommendations for Other Services       Precautions / Restrictions Precautions Precautions: Fall Precaution Comments: NG tube, R drain    Mobility  Bed Mobility               General bed mobility comments: pt up in recliner and premedicated  Transfers Overall transfer level: Needs assistance Equipment used: Rolling walker (2 wheeled) Transfers: Sit to/from Stand Sit to Stand: Min guard         General transfer comment: verbal cues for safe technique  Ambulation/Gait Ambulation/Gait assistance: Min guard Ambulation Distance (Feet): 600 Feet Assistive device: Rolling walker (2 wheeled) Gait Pattern/deviations: Step-through pattern;Trunk flexed;Narrow base of support Gait velocity: decr   General Gait Details: able to improve distance however reports fatigue, still required RW today, pt hopes to need no assistive device soon   Stairs            Wheelchair Mobility    Modified Rankin (Stroke Patients Only)       Balance                                    Cognition Arousal/Alertness: Awake/alert Behavior During Therapy: WFL  for tasks assessed/performed Overall Cognitive Status: Within Functional Limits for tasks assessed                      Exercises General Exercises - Lower Extremity Ankle Circles/Pumps: AROM;Both;10 reps;Seated Long Arc Quad: AROM;Seated;Both;10 reps Hip Flexion/Marching: AROM;Both;10 reps    General Comments        Pertinent Vitals/Pain Premedicated for session, pt reports pain but better after medication, activity to tolerance    Home Living                      Prior Function            PT Goals (current goals can now be found in the care plan section) Progress towards PT goals: Progressing toward goals    Frequency  Min 3X/week    PT Plan Current plan remains appropriate    Co-evaluation             End of Session   Activity Tolerance: Patient tolerated treatment well Patient left: in chair;with call bell/phone within reach;with family/visitor present     Time: 5681-2751 PT Time Calculation (min): 23 min  Charges:  $Gait Training: 23-37 mins                    G Codes:      Clotiel Troop,KATHrine E 11-14-2013, 3:56 PM Carmelia Bake, PT, DPT 11/14/13 Pager: [phone]

## 2013-10-17 NOTE — Telephone Encounter (Signed)
Spoke with patient and checked in with her to review discussion per Dr. Benay Spice on 10/16/13.  She understands she will receive follow-up appointment with medical oncologist when DC date is known.  Will continue to follow.

## 2013-10-18 ENCOUNTER — Inpatient Hospital Stay (HOSPITAL_COMMUNITY): Payer: 59

## 2013-10-18 LAB — CBC
HCT: 23.6 % — ABNORMAL LOW (ref 36.0–46.0)
Hemoglobin: 7.2 g/dL — ABNORMAL LOW (ref 12.0–15.0)
MCH: 22.6 pg — AB (ref 26.0–34.0)
MCHC: 30.5 g/dL (ref 30.0–36.0)
MCV: 74.2 fL — AB (ref 78.0–100.0)
PLATELETS: 346 10*3/uL (ref 150–400)
RBC: 3.18 MIL/uL — AB (ref 3.87–5.11)
RDW: 19.7 % — ABNORMAL HIGH (ref 11.5–15.5)
WBC: 17.8 10*3/uL — ABNORMAL HIGH (ref 4.0–10.5)

## 2013-10-18 LAB — BASIC METABOLIC PANEL
Anion gap: 10 (ref 5–15)
BUN: 9 mg/dL (ref 6–23)
CO2: 24 mEq/L (ref 19–32)
Calcium: 8.8 mg/dL (ref 8.4–10.5)
Chloride: 100 mEq/L (ref 96–112)
Creatinine, Ser: 0.44 mg/dL — ABNORMAL LOW (ref 0.50–1.10)
GLUCOSE: 116 mg/dL — AB (ref 70–99)
POTASSIUM: 3.7 meq/L (ref 3.7–5.3)
Sodium: 134 mEq/L — ABNORMAL LOW (ref 137–147)

## 2013-10-18 LAB — GLUCOSE, CAPILLARY
GLUCOSE-CAPILLARY: 121 mg/dL — AB (ref 70–99)
Glucose-Capillary: 132 mg/dL — ABNORMAL HIGH (ref 70–99)
Glucose-Capillary: 122 mg/dL — ABNORMAL HIGH (ref 70–99)

## 2013-10-18 MED ORDER — TRACE MINERALS CR-CU-F-FE-I-MN-MO-SE-ZN IV SOLN
INTRAVENOUS | Status: AC
  Administered 2013-10-18: 18:00:00 via INTRAVENOUS
  Filled 2013-10-18: qty 2000

## 2013-10-18 MED ORDER — ALBUTEROL SULFATE (2.5 MG/3ML) 0.083% IN NEBU: 2.5000 mg | INHALATION_SOLUTION | RESPIRATORY_TRACT | Status: DC | PRN

## 2013-10-18 MED ORDER — FAT EMULSION 20 % IV EMUL
250.0000 mL | INTRAVENOUS | Status: AC
  Administered 2013-10-18: 250 mL via INTRAVENOUS
  Filled 2013-10-18: qty 250

## 2013-10-18 NOTE — Progress Notes (Addendum)
ANTIBIOTIC CONSULT NOTE - FOLLOW UP  Pharmacy Consult for vancomycin Indication: HCAP  Allergies  Allergen Reactions  . Azithromycin Rash  . Sulfa Antibiotics Rash  . Hydrocodone-Acetaminophen Nausea And Vomiting    Hallucinating   . 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 SHE DOES NOT WANT ANY BLOOD PRODUCTS OR FRACTIONS    Patient Measurements: Height: 5\' 6"  (167.6 cm) Weight: 161 lb 13.1 oz (73.4 kg) IBW/kg (Calculated) : 59.3 Adjusted Body Weight:   Vital Signs: Temp: 98.6 F (37 C) (07/24 2156) Temp src: Oral (07/24 2156) BP: 128/62 mmHg (07/24 2156) Pulse Rate: 122 (07/24 2156) Intake/Output from previous day: 07/24 0701 - 07/25 0700 In: 1082 [I.V.:362; NG/GT:150; IV Piggyback:480] Out: 650 [Urine:650] Intake/Output from this shift: Total I/O In: 110 [NG/GT:60; IV Piggyback:50] Out: 350 [Urine:350]  Labs:  Recent Labs  10/15/13 0447 10/16/13 0530 10/17/13 0620  WBC 18.1* 18.6* 16.1*  HGB 7.8* 7.6* 7.2*  PLT 205 207 248  CREATININE 0.46* 0.38* 0.44*   Estimated Creatinine Clearance: 95.8 ml/min (by C-G formula based on Cr of 0.44).  Recent Labs  10/16/13 0530 10/17/13 2115  VANCOTROUGH 9.8* 13.1     Microbiology: Recent Results (from the past 720 hour(s))  MRSA PCR SCREENING     Status: None   Collection Time    10/10/13  3:39 PM      Result Value Ref Range Status   MRSA by PCR NEGATIVE  NEGATIVE Final   Comment:            The GeneXpert MRSA Assay (FDA     approved for NASAL specimens     only), is one component of a     comprehensive MRSA colonization     surveillance program. It is not     intended to diagnose MRSA     infection nor to guide or     monitor treatment for     MRSA infections.  CULTURE, BLOOD (ROUTINE X 2)     Status: None   Collection Time    10/14/13 11:50 AM      Result Value Ref Range Status   Specimen Description BLOOD LEFT ARM   Final   Special Requests BOTTLES DRAWN AEROBIC AND ANAEROBIC 5CC EACH   Final   Culture   Setup Time     Final   Value: 10/14/2013 14:17     Performed at [facility]   Culture     Final   Value:        BLOOD CULTURE RECEIVED NO GROWTH TO DATE CULTURE WILL BE HELD FOR 5 DAYS BEFORE ISSUING A FINAL NEGATIVE REPORT     Performed at [facility]   Report Status PENDING   Incomplete  CULTURE, BLOOD (ROUTINE X 2)     Status: None   Collection Time    10/14/13 11:53 AM      Result Value Ref Range Status   Specimen Description BLOOD LEFT HAND   Final   Special Requests BOTTLES DRAWN AEROBIC AND ANAEROBIC 5CC EACH   Final   Culture  Setup Time     Final   Value: 10/14/2013 14:17     Performed at [facility]   Culture     Final   Value:        BLOOD CULTURE RECEIVED NO GROWTH TO DATE CULTURE WILL BE HELD FOR 5 DAYS BEFORE ISSUING A FINAL NEGATIVE REPORT     Performed at [facility]  Report Status PENDING   Incomplete  URINE CULTURE     Status: None   Collection Time    10/14/13 12:09 PM      Result Value Ref Range Status   Specimen Description URINE, CATHETERIZED   Final   Special Requests NONE   Final   Culture  Setup Time     Final   Value: 10/14/2013 17:48     Performed at [place]     Final   Value: NO GROWTH     Performed at [facility]   Culture     Final   Value: NO GROWTH     Performed at [facility]   Report Status 10/15/2013 FINAL   Final  CULTURE, EXPECTORATED SPUTUM-ASSESSMENT     Status: None   Collection Time    10/14/13  2:59 PM      Result Value Ref Range Status   Specimen Description SPUTUM   Final   Special Requests NONE   Final   Sputum evaluation     Final   Value: THIS SPECIMEN IS ACCEPTABLE. RESPIRATORY CULTURE REPORT TO FOLLOW.   Report Status 10/14/2013 FINAL   Final  CULTURE, RESPIRATORY (NON-EXPECTORATED)     Status: None   Collection Time    10/14/13  2:59 PM      Result Value Ref Range Status   Specimen Description SPUTUM   Final   Special Requests NONE    Final   Gram Stain     Final   Value: RARE WBC PRESENT, PREDOMINANTLY MONONUCLEAR     FEW SQUAMOUS EPITHELIAL CELLS PRESENT     MODERATE GRAM POSITIVE COCCI IN PAIRS     IN CLUSTERS FEW GRAM POSITIVE RODS     RARE GRAM NEGATIVE RODS   Culture     Final   Value: NORMAL OROPHARYNGEAL FLORA     Performed at [facility]   Report Status 10/16/2013 FINAL   Final    Anti-infectives   Start     Dose/Rate Route Frequency Ordered Stop   10/17/13 2315  vancomycin (VANCOCIN) 1,250 mg in sodium chloride 0.9 % 250 mL IVPB     1,250 mg 166.7 mL/hr over 90 Minutes Intravenous 3 times per day 10/17/13 2303     10/16/13 0830  vancomycin (VANCOCIN) IVPB 1000 mg/200 mL premix  Status:  Discontinued     1,000 mg 200 mL/hr over 60 Minutes Intravenous 3 times per day 10/16/13 0730 10/17/13 2303   10/14/13 2200  vancomycin (VANCOCIN) IVPB 750 mg/150 ml premix  Status:  Discontinued     750 mg 150 mL/hr over 60 Minutes Intravenous Every 8 hours 10/14/13 1158 10/16/13 0730   10/14/13 1400  ceFEPIme (MAXIPIME) 1 g in dextrose 5 % 50 mL IVPB     1 g 100 mL/hr over 30 Minutes Intravenous 3 times per day 10/14/13 1125 10/22/13 1359   10/14/13 1400  vancomycin (VANCOCIN) 1,500 mg in sodium chloride 0.9 % 500 mL IVPB     1,500 mg 250 mL/hr over 120 Minutes Intravenous  Once 10/14/13 1158 10/14/13 1658   10/12/13 1600  levofloxacin (LEVAQUIN) IVPB 500 mg  Status:  Discontinued     500 mg 100 mL/hr over 60 Minutes Intravenous Every 24 hours 10/12/13 1545 10/14/13 1124   10/10/13 1230  ceFAZolin (ANCEF) IVPB 2 g/50 mL premix     2 g 100 mL/hr over 30 Minutes Intravenous  Once 10/10/13 1220 10/10/13 1222  10/10/13 0600  [MAR Hold]  ceFAZolin (ANCEF) IVPB 2 g/50 mL premix     (On MAR Hold since 10/10/13 0659)   2 g 100 mL/hr over 30 Minutes Intravenous On call to O.R. 10/09/13 1244 10/10/13 0743      Assessment: Patient with low vancomycin level.    Goal of Therapy:  Vancomycin trough level 15-20  mcg/ml  Plan:  Measure antibiotic drug levels at steady state Follow up culture results Change vancomycin to 1250mg  iv q8hr with dose now  Tyler Deis, Shea Stakes [place] 10/18/2013,2:13 AM

## 2013-10-18 NOTE — Progress Notes (Addendum)
Patient ID: Andrea Best, female   DOB: 31-Mar-1970, 43 y.o.   MRN: [id number] [facility] PROGRESS NOTE  Andrea Best KZS:[id number] DOB: 1971-01-11 DOA: 10/08/2013 PCP: Conni Slipper, MD  Brief narrative: 43 y.o. 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 She underwent subtotal gastrectomy with porta hepatis and peripancreatic lymph node dissection with a Roux-en-Y gastrojejunostomy on 10/10/13. Hospital course is complicated due to development of HCAP for which she is on maxipime and vancomycin.   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 Was in SDU post-op. Remains stable on medical floor.  NG tube still present but hopefully can come out today, will see with surgery. Pt was seen by Dr. Benay Spice, possible consideration for adjuvant chemotherapy. Currently on TNA, NPO Continue pain control efforts: robaxin 1000 mg every 8 hours IV; dilaudid 1 mg IV every 5 hours PRN  Active Problems:  Postoperative Fever/tachycardia / HCAP  CXR was suggestive of developing PNA. Pt started on Levaquin but this was changed to maxipime and vanco on 10/14/13. She is doing better so we will get CXR to follow up on resolution of pneumonia. Hypomagnesemia / hypophosphatemia /hypokalemia  Replete as needed. 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  Hemoglobin ranges 7.2-8.0  Protein-calorie malnutrition, severe  Pt meets criteria for severe MALNUTRITION in the context of  chronic illness as evidenced by PO intake < 75% for > one month, 16% body weight loss in 4 months.  PICC line placed for TNA administration which will likely continue even if able to have PO intake. Lupus/Sjogren syndrome  Plaquenil and prednisone currently on hold. Will resume once PO intake starts. DVT Prophylaxis  Hold heparin or Lovenox due to drop in Hg  SCD's ordered for DVT prophylaxis    Code Status: Full.  Family Communication: Mother updated at the bedside daily.  Disposition Plan: home when stable    IV access:  PICC placed 10/09/13. Consultants:  Dr. Betsy Coder, Oncology.  Dr. Excell Seltzer, Surgery Other consultants:  [place] Procedures:  Chest x-ray 10/08/13: No active disease.  Subtotal gastrectomy with porta hepatis and peripancreatic lymph node dissection, Roux-en-Y gastrojejunostomy 10/10/13 by Dr. Excell Seltzer.  Chest x-ray 10/12/13: ? Developing PNA CXR 10/18/2013 - to follow up on resolution of pneumonia Antibiotics:  Levaquin 7/19 --> 7/21  Maxipime 7/21 -->  Vancomycin 7/21 -->   [name], MD  [facility] Pager [phone]  If 7PM-7AM, please contact night-coverage [email or web address] Password [facility] 10/18/2013, 1:53 PM   LOS: 10 days    HPI/Subjective: No acute overnight events. Says she is hungry and hopes NG tube can be removed today.  Objective: Filed Vitals:   10/17/13 2120 10/17/13 2156 10/18/13 0548 10/18/13 0841  BP:  128/62 118/72   Pulse: 128 122 111   Temp:  98.6 F (37 C) 98.9 F (37.2 C)   TempSrc:  Oral Oral   Resp: 16 18 16    Height:      Weight:   71.305 kg (157 lb 3.2 oz)   SpO2: 99% 97% 98% 99%    Intake/Output Summary (Last  24 hours) at 10/18/13 1353 Last data filed at 10/18/13 8250  Gross per 24 hour  Intake   1672 ml  Output    360 ml  Net   1312 ml    Exam:   General:  Pt is alert, follows commands appropriately, not in acute distress  Cardiovascular: Regular rate and rhythm,  S1/S2, no murmurs  Respiratory: Clear to auscultation bilaterally, no wheezing  Abdomen: Soft, non tender, non distended, bowel sounds present; NG tube (+)  Extremities: No edema, pulses DP and PT palpable bilaterally  Neuro: Grossly nonfocal  Data Reviewed: Basic Metabolic Panel:  Recent Labs Lab 10/12/13 0433 10/13/13 0540 10/14/13 0450 10/15/13 0447 10/16/13 0530 10/17/13 0620 10/18/13 0455  NA 131* 134* 130* 134* 134* 133* 134*  K 4.1 4.0 3.7 3.6* 3.6* 3.8 3.7  CL 99 100 96 97 99 98 100  CO2 27 26 25 25 26 24 24   GLUCOSE 136* 127* 140* 137* 117* 138* 116*  BUN 6 6 8 8 7 8 9   CREATININE 0.51 0.44* 0.47* 0.46* 0.38* 0.44* 0.44*  CALCIUM 8.1* 8.4 8.5 8.6 8.8 8.6 8.8  MG 1.8 1.7 1.8  --  2.0  --   --   PHOS 2.3 2.9  --   --  4.4  --   --    Liver Function Tests:  Recent Labs Lab 10/13/13 0540 10/14/13 0450 10/16/13 0530  AST 22 15 29   ALT 12 10 19   ALKPHOS 67 85 165*  BILITOT 0.2* <0.2* 0.3  PROT 6.2 6.8 7.0  ALBUMIN 1.6* 1.6* 1.6*   No results found for this basename: LIPASE, AMYLASE,  in the last 168 hours No results found for this basename: AMMONIA,  in the last 168 hours CBC:  Recent Labs Lab 10/13/13 0540 10/14/13 0450 10/15/13 0447 10/16/13 0530 10/17/13 0620 10/18/13 0455  WBC 17.2* 18.4* 18.1* 18.6* 16.1* 17.8*  NEUTROABS 14.5*  --   --   --   --   --   HGB 7.7* 8.0* 7.8* 7.6* 7.2* 7.2*  HCT 25.1* 25.9* 25.0* 24.6* 23.2* 23.6*  MCV 72.8* 73.4* 72.9* 73.2* 73.4* 74.2*  PLT 161 212 205 207 248 346   Cardiac Enzymes:  Recent Labs Lab 10/16/13 2230 10/17/13 0620  TROPONINI <0.30 <0.30   BNP: No components found with this basename: POCBNP,  CBG:  Recent Labs Lab 10/17/13 0637 10/17/13 1216 10/17/13 1654 10/17/13 2351 10/18/13 0532  GLUCAP 108* 123* 124* 148* 132*    Recent Results (from the past 240 hour(s))  MRSA PCR SCREENING     Status: None   Collection Time    10/10/13  3:39 PM      Result Value Ref Range Status    MRSA by PCR NEGATIVE  NEGATIVE Final   Comment:            The GeneXpert MRSA Assay (FDA     approved for NASAL specimens     only), is one component of a     comprehensive MRSA colonization     surveillance program. It is not     intended to diagnose MRSA     infection nor to guide or     monitor treatment for     MRSA infections.  CULTURE, BLOOD (ROUTINE X 2)     Status: None   Collection Time    10/14/13 11:50 AM      Result Value Ref Range Status   Specimen Description BLOOD LEFT ARM   Final  Special Requests BOTTLES DRAWN AEROBIC AND ANAEROBIC [facility] EACH   Final   Culture  Setup Time     Final   Value: 10/14/2013 14:17     Performed at [facility]   Culture     Final   Value:        BLOOD CULTURE RECEIVED NO GROWTH TO DATE CULTURE WILL BE HELD FOR 5 DAYS BEFORE ISSUING A FINAL NEGATIVE REPORT     Performed at [facility]   Report Status PENDING   Incomplete  CULTURE, BLOOD (ROUTINE X 2)     Status: None   Collection Time    10/14/13 11:53 AM      Result Value Ref Range Status   Specimen Description BLOOD LEFT HAND   Final   Special Requests BOTTLES DRAWN AEROBIC AND ANAEROBIC 5CC EACH   Final   Culture  Setup Time     Final   Value: 10/14/2013 14:17     Performed at [facility]   Culture     Final   Value:        BLOOD CULTURE RECEIVED NO GROWTH TO DATE CULTURE WILL BE HELD FOR 5 DAYS BEFORE ISSUING A FINAL NEGATIVE REPORT     Performed at [facility]   Report Status PENDING   Incomplete  URINE CULTURE     Status: None   Collection Time    10/14/13 12:09 PM      Result Value Ref Range Status   Specimen Description URINE, CATHETERIZED   Final   Special Requests NONE   Final   Culture  Setup Time     Final   Value: 10/14/2013 17:48     Performed at [place]     Final   Value: NO GROWTH     Performed at [facility]   Culture     Final   Value: NO GROWTH     Performed at [facility]    Report Status 10/15/2013 FINAL   Final  CULTURE, EXPECTORATED SPUTUM-ASSESSMENT     Status: None   Collection Time    10/14/13  2:59 PM      Result Value Ref Range Status   Specimen Description SPUTUM   Final   Special Requests NONE   Final   Sputum evaluation     Final   Value: THIS SPECIMEN IS ACCEPTABLE. RESPIRATORY CULTURE REPORT TO FOLLOW.   Report Status 10/14/2013 FINAL   Final  CULTURE, RESPIRATORY (NON-EXPECTORATED)     Status: None   Collection Time    10/14/13  2:59 PM      Result Value Ref Range Status   Specimen Description SPUTUM   Final   Special Requests NONE   Final   Gram Stain     Final   Value: RARE WBC PRESENT, PREDOMINANTLY MONONUCLEAR     FEW SQUAMOUS EPITHELIAL CELLS PRESENT     MODERATE GRAM POSITIVE COCCI IN PAIRS     IN CLUSTERS FEW GRAM POSITIVE RODS     RARE GRAM NEGATIVE RODS   Culture     Final   Value: NORMAL OROPHARYNGEAL FLORA     Performed at [facility]   Report Status 10/16/2013 FINAL   Final     Studies: No results found.  Scheduled Meds: . albuterol  2.5 mg Nebulization TID  . ceFEPime (MAXIPIME) IV  1 g Intravenous 3 times per day  . insulin aspart  0-9 Units Subcutaneous 4 times per day  . methocarbamol   1,000 mg Intravenous Q8H  . metoCLOPramide   5 mg Intravenous 3 times per day  . ondansetron (ZOFRAN)   4 mg Intravenous 4 times per day  . Protonix  40 mg Intravenous QHS  . vancomycin  1,250 mg Intravenous 3 times per day   Continuous Infusions: . sodium chloride 30 mL/hr at 10/17/13 0514  . Marland KitchenTPN (CLINIMIX-E) Adult 80 mL/hr at 10/17/13 2328   And  . fat emulsion 250 kcal (10/17/13 2328)  . Marland KitchenTPN (CLINIMIX-E) Adult     And  . fat emulsion

## 2013-10-18 NOTE — Progress Notes (Signed)
Patient ID: Andrea Best, female   DOB: 02-May-1970, 43 y.o.   MRN: [id number] 8 Days Post-Op  Subjective: States she feels a lot better the last couple of days. Had several bowel movements last night and this morning. Has had no nausea or belching or bloating with NG clamped for 24 hours. Pain is improving.  Objective: Vital signs in last 24 hours: Temp:  [98.1 F (36.7 C)-98.9 F (37.2 C)] 98.9 F (37.2 C) (07/25 0548) Pulse Rate:  [111-128] 111 (07/25 0548) Resp:  [16-18] 16 (07/25 0548) BP: (115-128)/(62-72) 118/72 mmHg (07/25 0548) SpO2:  [97 %-99 %] 99 % (07/25 0841) Weight:  [157 lb 3.2 oz (71.305 kg)] 157 lb 3.2 oz (71.305 kg) (07/25 0548) Last BM Date: 10/18/13  Intake/Output from previous day: 07/24 0701 - 07/25 0700 In: 1792 [I.V.:482; NG/GT:150; IV Piggyback:1040] Out: 660 [Urine:650; Drains:10] Intake/Output this shift:    General appearance: alert, cooperative and no distress GI: minimal appropriate tenderness. Incision/Wound: clean and dry without erythema  Lab Results:   Recent Labs  10/17/13 0620 10/18/13 0455  WBC 16.1* 17.8*  HGB 7.2* 7.2*  HCT 23.2* 23.6*  PLT 248 346   BMET  Recent Labs  10/17/13 0620 10/18/13 0455  NA 133* 134*  K 3.8 3.7  CL 98 100  CO2 24 24  GLUCOSE 138* 116*  BUN 8 9  CREATININE 0.44* 0.44*  CALCIUM 8.6 8.8     Studies/Results: No results found.  Anti-infectives: Anti-infectives   Start     Dose/Rate Route Frequency Ordered Stop   10/17/13 2315  vancomycin (VANCOCIN) 1,250 mg in sodium chloride 0.9 % 250 mL IVPB     1,250 mg 166.7 mL/hr over 90 Minutes Intravenous 3 times per day 10/17/13 2303     10/16/13 0830  vancomycin (VANCOCIN) IVPB 1000 mg/200 mL premix  Status:  Discontinued     1,000 mg 200 mL/hr over 60 Minutes Intravenous 3 times per day 10/16/13 0730 10/17/13 2303   10/14/13 2200  vancomycin (VANCOCIN) IVPB 750 mg/150 ml premix  Status:  Discontinued     750 mg 150 mL/hr over 60 Minutes  Intravenous Every 8 hours 10/14/13 1158 10/16/13 0730   10/14/13 1400  ceFEPIme (MAXIPIME) 1 g in dextrose 5 % 50 mL IVPB     1 g 100 mL/hr over 30 Minutes Intravenous 3 times per day 10/14/13 1125 10/22/13 1359   10/14/13 1400  vancomycin (VANCOCIN) 1,500 mg in sodium chloride 0.9 % 500 mL IVPB     1,500 mg 250 mL/hr over 120 Minutes Intravenous  Once 10/14/13 1158 10/14/13 1658   10/12/13 1600  levofloxacin (LEVAQUIN) IVPB 500 mg  Status:  Discontinued     500 mg 100 mL/hr over 60 Minutes Intravenous Every 24 hours 10/12/13 1545 10/14/13 1124   10/10/13 1230  ceFAZolin (ANCEF) IVPB 2 g/50 mL premix     2 g 100 mL/hr over 30 Minutes Intravenous  Once 10/10/13 1220 10/10/13 1222   10/10/13 0600  [MAR Hold]  ceFAZolin (ANCEF) IVPB 2 g/50 mL premix     (On MAR Hold since 10/10/13 0659)   2 g 100 mL/hr over 30 Minutes Intravenous On call to O.R. 10/09/13 1244 10/10/13 0743      Assessment/Plan: s/p Procedure(s): SUBTOTAL GASTRECTOMY WITH GASTRIC JEJUNOSOTMY Progressing well Will discontinue NG tube. Ice chips by mouth. Continue TNA for now. Persistent leukocytosis of uncertain origin. Follow for now.    LOS: 10 days    Tinslee Klare T 10/18/2013

## 2013-10-18 NOTE — Progress Notes (Signed)
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 Azithromycin Rash  . Sulfa Antibiotics Rash  . Hydrocodone-Acetaminophen Nausea And Vomiting    Hallucinating   . 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 SHE DOES NOT WANT ANY BLOOD PRODUCTS OR FRACTIONS   Patient Measurements: Height: 5\' 6"  (167.6 cm) Weight: 157 lb 3.2 oz (71.305 kg) IBW/kg (Calculated) : 59.3  Vital Signs: Temp: 98.9 F (37.2 C) (07/25 0548) Temp src: Oral (07/25 0548) BP: 118/72 mmHg (07/25 0548) Pulse Rate: 111 (07/25 0548) Intake/Output from previous day: 07/24 0701 - 07/25 0700 In: 5027 [I.V.:482; NG/GT:150; IV Piggyback:1040] Out: 660 [Urine:650; Drains:10] Intake/Output from this shift:    Labs:  Recent Labs  10/16/13 0530 10/17/13 0620 10/18/13 0455  WBC 18.6* 16.1* 17.8*  HGB 7.6* 7.2* 7.2*  HCT 24.6* 23.2* 23.6*  PLT 207 248 346    Recent Labs  10/16/13 0530 10/17/13 0620 10/18/13 0455  NA 134* 133* 134*  K 3.6* 3.8 3.7  CL 99 98 100  CO2 26 24 24   GLUCOSE 117* 138* 116*  BUN 7 8 9   CREATININE 0.38* 0.44* 0.44*  CALCIUM 8.8 8.6 8.8  MG 2.0  --   --   PHOS 4.4  --   --   PROT 7.0  --   --   ALBUMIN 1.6*  --   --   AST 29  --   --   ALT 19  --   --   ALKPHOS 165*  --   --   BILITOT 0.3  --   --    Estimated Creatinine Clearance: 94.6 ml/min (by C-G formula based on Cr of 0.44).    Recent Labs  10/17/13 1654 10/17/13 2351 10/18/13 0532  GLUCAP 124* 148* 132*   Medications:  Scheduled:  . albuterol  2.5 mg Nebulization TID  . antiseptic oral rinse  15 mL Mouth Rinse q12n4p  . ceFEPime (MAXIPIME) IV  1 g Intravenous 3 times per day  . chlorhexidine  15 mL Mouth Rinse BID  . insulin aspart  0-9 Units Subcutaneous 4 times per day  . methocarbamol (ROBAXIN) IV  1,000 mg Intravenous Q8H  . metoCLOPramide (REGLAN) injection  5 mg Intravenous 3 times per day  . ondansetron (ZOFRAN)  IV  4 mg Intravenous 4 times per day  . pantoprazole (PROTONIX) IV  40 mg Intravenous QHS  . sodium chloride  10-40 mL Intracatheter Q12H  . vancomycin  1,250 mg Intravenous 3 times per day   Infusions:  . sodium chloride 30 mL/hr at 10/17/13 0514  . Marland KitchenTPN (CLINIMIX-E) Adult 80 mL/hr at 10/17/13 2328   And  . fat emulsion 250 kcal (10/17/13 2328)     Nutritional Goals:   RD recs: Kcal: 2000-2200, Protein: 85-100 gram, Fluid: >/=2100 ml/daily  Per Dietitian recommendations and pt noting feelings of hunger/thirst, agree to increase kCal and adjust TPN accordingly  Clinimix 5/15 at a goal rate of 80 ml/hr + lipids to provide: 96 g/day protein, 1850 Kcal/day.  Clinimix 5/20 at a goal rate of 80 ml/hr + lipids to provide: 96 g/day protein, 2169 Kcal/day.  Current nutrition:   Diet: NPO  IVF: NS @ 30 ml/hr  Clinimix + Lipids at goal rate for total of 90 ml/hr  CBGs & Insulin requirements past 24 hours:   CBGs: 108-148  Novolog sensitive scale q4hr - 4 units  Assessment:  12 YOF presents with N/V, weight loss d/t difficulty eating with recent dx of Gastric cancer. EGD on 09/22/2013 reveald findings of a partially obstructing oozing cratered gastric ulcer in the gastric antrum. 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 TPN started 7/16 for GOO. Surgery consulted, s/p subtotal gastrectomy with gastric jejunostomy on 7/17.  UGI series 7/22 revealed: No evidence of anastomotic leak. Slow transit of contrast from the stomach to the jejunostomy loop.   Renal / Hepatic function: SCr remains low/stable, LFTs WNL  Electrolytes: Na low (unable to adjust in premix TNA), Corr Ca 10.72 (sl elevated), other WNL.    Pre-Albumin: 3.1 (7/20), 7.9 (7/17) - reflects depleted protein stores  TG:   146 (7/20), 94 (7/17), - increased, will continue to monitor  Glucose: at goal  <150 mg/dl, no hypoglycemia  TPN Access: PICC (placed 7/16)  TPN day#:   10  Plan:  At 1800 today:  Continue Clinimix E 5/20 at 80 ml/hr.  20% fat emulsion at 10 ml/hr.  TNA to contain standard multivitamins and trace elements daily.  Continue SSI q6h  TNA lab panels on Mondays & Thursdays.  F/u daily.   Gretta Arab PharmD, BCPS Pager [phone] 10/18/2013 8:50 AM

## 2013-10-19 ENCOUNTER — Encounter (HOSPITAL_COMMUNITY): Payer: Self-pay | Admitting: Infectious Diseases

## 2013-10-19 DIAGNOSIS — D72829 Elevated white blood cell count, unspecified: Secondary | ICD-10-CM

## 2013-10-19 DIAGNOSIS — J189 Pneumonia, unspecified organism: Secondary | ICD-10-CM

## 2013-10-19 LAB — CBC
HEMATOCRIT: 23.2 % — AB (ref 36.0–46.0)
Hemoglobin: 7.2 g/dL — ABNORMAL LOW (ref 12.0–15.0)
MCH: 22.9 pg — AB (ref 26.0–34.0)
MCHC: 31 g/dL (ref 30.0–36.0)
MCV: 73.9 fL — AB (ref 78.0–100.0)
Platelets: 424 10*3/uL — ABNORMAL HIGH (ref 150–400)
RBC: 3.14 MIL/uL — AB (ref 3.87–5.11)
RDW: 19.9 % — ABNORMAL HIGH (ref 11.5–15.5)
WBC: 18.6 10*3/uL — AB (ref 4.0–10.5)

## 2013-10-19 LAB — AMYLASE: Amylase: 173 U/L — ABNORMAL HIGH (ref 0–105)

## 2013-10-19 LAB — BASIC METABOLIC PANEL
Anion gap: 12 (ref 5–15)
BUN: 9 mg/dL (ref 6–23)
CALCIUM: 8.7 mg/dL (ref 8.4–10.5)
CO2: 22 meq/L (ref 19–32)
Chloride: 100 mEq/L (ref 96–112)
Creatinine, Ser: 0.48 mg/dL — ABNORMAL LOW (ref 0.50–1.10)
GFR calc Af Amer: >90 mL/min (ref >90)
GFR calc non Af Amer: >90 mL/min (ref >90)
GLUCOSE: 107 mg/dL — AB (ref 70–99)
Potassium: 3.8 mEq/L (ref 3.7–5.3)
SODIUM: 134 meq/L — AB (ref 137–147)

## 2013-10-19 LAB — GLUCOSE, CAPILLARY
GLUCOSE-CAPILLARY: 117 mg/dL — AB (ref 70–99)
GLUCOSE-CAPILLARY: 119 mg/dL — AB (ref 70–99)
Glucose-Capillary: 122 mg/dL — ABNORMAL HIGH (ref 70–99)
Glucose-Capillary: 124 mg/dL — ABNORMAL HIGH (ref 70–99)

## 2013-10-19 LAB — LIPASE, BLOOD: Lipase: 121 U/L — ABNORMAL HIGH (ref 11–59)

## 2013-10-19 LAB — VANCOMYCIN, TROUGH: VANCOMYCIN TR: 20.6 ug/mL — AB (ref 10.0–20.0)

## 2013-10-19 MED ORDER — FAT EMULSION 20 % IV EMUL
250.0000 mL | INTRAVENOUS | Status: AC
  Administered 2013-10-19: 250 mL via INTRAVENOUS
  Filled 2013-10-19: qty 250

## 2013-10-19 MED ORDER — VANCOMYCIN HCL IN DEXTROSE 1-5 GM/200ML-% IV SOLN
1000.0000 mg | Freq: Three times a day (TID) | INTRAVENOUS | Status: DC
  Administered 2013-10-19 – 2013-10-24 (×15): 1000 mg via INTRAVENOUS
  Filled 2013-10-19 (×16): qty 200

## 2013-10-19 MED ORDER — TRACE MINERALS CR-CU-F-FE-I-MN-MO-SE-ZN IV SOLN
INTRAVENOUS | Status: AC
  Administered 2013-10-19: 18:00:00 via INTRAVENOUS
  Filled 2013-10-19: qty 2000

## 2013-10-19 NOTE — Progress Notes (Signed)
ANTIBIOTIC CONSULT NOTE - FOLLOW UP  Pharmacy Consult for Vancomycin, Antibiotic renal dose adjustment (Cefepime) Indication: pneumonia  Allergies  Allergen Reactions  . Azithromycin Rash  . Sulfa Antibiotics Rash  . Hydrocodone-Acetaminophen Nausea And Vomiting    Hallucinating   . 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 SHE DOES NOT WANT ANY BLOOD PRODUCTS OR FRACTIONS    Patient Measurements: Height: 5\' 6"  (167.6 cm) Weight: 157 lb 3.2 oz (71.305 kg) IBW/kg (Calculated) : 59.3  Vital Signs: Temp: 98.7 F (37.1 C) (07/26 0513) Temp src: Oral (07/26 0513) BP: 121/62 mmHg (07/26 0513) Pulse Rate: 112 (07/26 0513) Intake/Output from previous day: 07/25 0701 - 07/26 0700 In: 5277 [I.V.:960; IV Piggyback:770] Out: -   Labs:  Recent Labs  10/17/13 0620 10/18/13 0455 10/19/13 0405  WBC 16.1* 17.8* 18.6*  HGB 7.2* 7.2* 7.2*  PLT 248 346 424*  CREATININE 0.44* 0.44* 0.48*   Estimated Creatinine Clearance: 94.6 ml/min (by C-G formula based on Cr of 0.48).  Recent Labs  10/17/13 2115 10/19/13 1300  VANCOTROUGH 13.1 20.6*     Anti-infectives: 7/19 >> levofloxacin >> 7/21 7/21 >> vancomycin >> 7/21 >> cefepime >>    Assessment: 39 YOF with Gastric cancer s/p upper endoscopy 06/29.  Admitted 7/15 with findings of a partially obstructing oozing cratered gastric ulcer in the gastric antrum. 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 Started of levofloxacin for possible pneumonia 7/19, but pharmacy consulted on 7/21 to broaden to vancomycin/cefepime.  Day #6 Vancomycin and Cefepime  Tmax: 100.1, Tc 98.7  WBCs: increased, 18.6  Renal: Scr low, normalized CrCl > 11ml/min  Vancomycin trough level = 20.6, above goal range.   Goal of Therapy:  Vancomycin trough level 15-20 mcg/ml Appropriate abx dosing, eradication of infection.   Plan:   Hold 1400 vancomycin dose  Decrease to  Vancomycin 1g IV q8h.  Recheck Vanc trough at steady state.  Follow up renal fxn and culture results.  Gretta Arab PharmD, BCPS Pager [phone] 10/19/2013 2:00 PM

## 2013-10-19 NOTE — Consult Note (Signed)
[facility]  Date of Admission:  10/08/2013  Date of Consult:  10/19/2013  Reason for Consult: Pneumonia, Leukocytosis Referring Physician: Charlies Silvers  Impression/Recommendation Leukocytosis Pneumonia  Would Consider CT scan chest/abd/pelvis Remove PIC? Consider LE dopplers Check HIV  Comment Wider ddx for her fevers. She has had recent surgery so would be worthwhile to investigate her wound bed. As well, she has not been on heparin so she is at increased risk of DVT, PE. Could this be lupus flare? Will be hard to check her inflammatory markers to f/u as they will be up from her surgery.   Thank you so much for this interesting consult,   Bobby Rumpf (pager) [phone] [email or web address]  Andrea Best is an 43 y.o. female.  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 On 7-17 she underwent subtotal gastrectomy with porta hepatis and peripancreatic lymph node dissection with a Roux-en-Y gastrojejunostomy. By 7-18 she had WBC 22.4 and by 7-19 she had developed temp to 102.1. She was started on levaquin for possible pneumonia (CXR- 7/19- Low lung volumes with bibasilar atelectasis or pneumonia.). By 7-21 her temp was 101.5 and her WBC was 18.4. Her anbx were changed to vanco/cefepime. Repeat CXR showed: Worsening atelectasis and/or consolidation in the left lower lobe with increasing moderate left pleural effusion. 7-22 she underwent upper GI series to evaluate for leak (none found).  A repeat CXR on 7-25 showed airspace consolidation LLL with small effusion.  Tmax last 24h 100.1.  Resp Cx (7-21) nl flora BCx (7-21) ngtd UCx (7-21) (-)  She was given decadron 7-17 (previously on home prednisone 72m daily).   We are now asked to see for persistent leukocytosis.  Past Medical History  Diagnosis Date  . Asthma   . Eczema   . Lupus   . Sjogren's disease     Past Surgical  History  Procedure Laterality Date  . Cesarean section      1997  . Laparotomy N/A 10/10/2013    Procedure: SUBTOTAL GASTRECTOMY WITH GASTRIC JEJUNOSOTMY;  Surgeon: BEdward Jolly MD;  Location: [facility];  Service: General;  Laterality: N/A;     Allergies  Allergen Reactions  . Azithromycin Rash  . Sulfa Antibiotics Rash  . Hydrocodone-Acetaminophen Nausea And Vomiting    Hallucinating   . 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 SHE DOES NOT WANT ANY BLOOD PRODUCTS OR FRACTIONS    Medications:  Scheduled: . antiseptic oral rinse  15 mL Mouth Rinse q12n4p  . ceFEPime (MAXIPIME) IV  1 g Intravenous 3 times per day  . chlorhexidine  15 mL Mouth Rinse BID  . insulin aspart  0-9 Units Subcutaneous 4 times per day  . methocarbamol (ROBAXIN) IV  1,000 mg Intravenous Q8H  . metoCLOPramide (REGLAN) injection  5 mg Intravenous 3 times per day  . ondansetron (ZOFRAN) IV  4 mg Intravenous 4 times per day  . pantoprazole (PROTONIX) IV  40 mg Intravenous QHS  . sodium chloride  10-40 mL Intracatheter Q12H  . vancomycin  1,000 mg Intravenous Q8H    Abtx:  Anti-infectives   Start     Dose/Rate Route Frequency Ordered Stop   10/19/13 2200  vancomycin (VANCOCIN) IVPB 1000 mg/200 mL premix     1,000 mg 200 mL/hr over 60 Minutes Intravenous Every 8 hours 10/19/13 1402     10/17/13 2315  vancomycin (VANCOCIN) 1,250 mg in sodium  chloride 0.9 % 250 mL IVPB  Status:  Discontinued     1,250 mg 166.7 mL/hr over 90 Minutes Intravenous 3 times per day 10/17/13 2303 10/19/13 1337   10/16/13 0830  vancomycin (VANCOCIN) IVPB 1000 mg/200 mL premix  Status:  Discontinued     1,000 mg 200 mL/hr over 60 Minutes Intravenous 3 times per day 10/16/13 0730 10/17/13 2303   10/14/13 2200  vancomycin (VANCOCIN) IVPB 750 mg/150 ml premix  Status:  Discontinued     750 mg 150 mL/hr over 60 Minutes Intravenous Every 8 hours 10/14/13 1158 10/16/13 0730   10/14/13 1400  ceFEPIme (MAXIPIME) 1 g in dextrose 5 % 50 mL  IVPB     1 g 100 mL/hr over 30 Minutes Intravenous 3 times per day 10/14/13 1125 10/22/13 1359   10/14/13 1400  vancomycin (VANCOCIN) 1,500 mg in sodium chloride 0.9 % 500 mL IVPB     1,500 mg 250 mL/hr over 120 Minutes Intravenous  Once 10/14/13 1158 10/14/13 1658   10/12/13 1600  levofloxacin (LEVAQUIN) IVPB 500 mg  Status:  Discontinued     500 mg 100 mL/hr over 60 Minutes Intravenous Every 24 hours 10/12/13 1545 10/14/13 1124   10/10/13 1230  ceFAZolin (ANCEF) IVPB 2 g/50 mL premix     2 g 100 mL/hr over 30 Minutes Intravenous  Once 10/10/13 1220 10/10/13 1222   10/10/13 0600  [MAR Hold]  ceFAZolin (ANCEF) IVPB 2 g/50 mL premix     (On MAR Hold since 10/10/13 0659)   2 g 100 mL/hr over 30 Minutes Intravenous On call to O.R. 10/09/13 1244 10/10/13 0743      Total days of antibiotics: 8 levaquin 7-19 -->7-21 Cefepime 7-21 --> Vancomycin 7-21 -->          Social History:  reports that she has never smoked. She does not have any smokeless tobacco history on file. She reports that she drinks alcohol. She reports that she does not use illicit drugs.  Family History  Problem Relation Age of Onset  . Hypertension Mother   . Diabetes Mother   . Diabetes Maternal Grandmother   . Diabetes Maternal Grandfather     General ROS: normal BM, minimal pain from PIC, no abd pain, no SOB, no CP, see HPI.   Blood pressure 121/62, pulse 112, temperature 98.7 F (37.1 C), temperature source Oral, resp. rate 20, height _0  (1.676 m), weight 71.305 kg (157 lb 3.2 oz), last menstrual period 10/11/2013, SpO2 99.00%. General appearance: alert, cooperative and no distress Eyes: negative findings: conjunctivae and sclerae normal and pupils equal, round, reactive to light and accomodation Throat: lips, mucosa, and tongue normal; teeth and gums normal Neck: no adenopathy and supple, symmetrical, trachea midline Lungs: clear to auscultation bilaterally Heart: regular rate and rhythm Abdomen: normal  findings: bowel sounds normal and soft, non-tender Extremities: edema none and RUE PIC is clean, nontender, no d/c. it is warm.  Her abd wound is clean, non-tender. There is warmth here.  There is no cordis palpable in her calf's   Results for orders placed during the hospital encounter of 10/08/13 (from the past 48 hour(s))  GLUCOSE, CAPILLARY     Status: Abnormal   Collection Time    10/17/13  4:54 PM      Result Value Ref Range   Glucose-Capillary 124 (*) 70 - 99 mg/dL  VANCOMYCIN, TROUGH     Status: None   Collection Time    10/17/13  9:15 PM  Result Value Ref Range   Vancomycin Tr 13.1  10.0 - 20.0 ug/mL  GLUCOSE, CAPILLARY     Status: Abnormal   Collection Time    10/17/13 11:51 PM      Result Value Ref Range   Glucose-Capillary 148 (*) 70 - 99 mg/dL  BASIC METABOLIC PANEL     Status: Abnormal   Collection Time    10/18/13  4:55 AM      Result Value Ref Range   Sodium 134 (*) 137 - 147 mEq/L   Potassium 3.7  3.7 - 5.3 mEq/L   Chloride 100  96 - 112 mEq/L   CO2 24  19 - 32 mEq/L   Glucose, Bld 116 (*) 70 - 99 mg/dL   BUN 9  6 - 23 mg/dL   Creatinine, Ser 0.44 (*) 0.50 - 1.10 mg/dL   Calcium 8.8  8.4 - 10.5 mg/dL   GFR calc non Af Amer >90  >90 mL/min   GFR calc Af Amer >90  >90 mL/min   Comment: (NOTE)     The eGFR has been calculated using the CKD EPI equation.     This calculation has not been validated in all clinical situations.     eGFR's persistently <90 mL/min signify possible Chronic Kidney     Disease.   Anion gap 10  5 - 15  CBC     Status: Abnormal   Collection Time    10/18/13  4:55 AM      Result Value Ref Range   WBC 17.8 (*) 4.0 - 10.5 K/uL   RBC 3.18 (*) 3.87 - 5.11 MIL/uL   Hemoglobin 7.2 (*) 12.0 - 15.0 g/dL   HCT 23.6 (*) 36.0 - 46.0 %   MCV 74.2 (*) 78.0 - 100.0 fL   MCH 22.6 (*) 26.0 - 34.0 pg   MCHC 30.5  30.0 - 36.0 g/dL   RDW 19.7 (*) 11.5 - 15.5 %   Platelets 346  150 - 400 K/uL   Comment: DELTA CHECK NOTED     REPEATED TO  VERIFY  GLUCOSE, CAPILLARY     Status: Abnormal   Collection Time    10/18/13  5:32 AM      Result Value Ref Range   Glucose-Capillary 132 (*) 70 - 99 mg/dL  GLUCOSE, CAPILLARY     Status: Abnormal   Collection Time    10/18/13  5:46 PM      Result Value Ref Range   Glucose-Capillary 122 (*) 70 - 99 mg/dL  GLUCOSE, CAPILLARY     Status: Abnormal   Collection Time    10/18/13 11:15 PM      Result Value Ref Range   Glucose-Capillary 121 (*) 70 - 99 mg/dL  BASIC METABOLIC PANEL     Status: Abnormal   Collection Time    10/19/13  4:05 AM      Result Value Ref Range   Sodium 134 (*) 137 - 147 mEq/L   Potassium 3.8  3.7 - 5.3 mEq/L   Chloride 100  96 - 112 mEq/L   CO2 22  19 - 32 mEq/L   Glucose, Bld 107 (*) 70 - 99 mg/dL   BUN 9  6 - 23 mg/dL   Creatinine, Ser 0.48 (*) 0.50 - 1.10 mg/dL   Calcium 8.7  8.4 - 10.5 mg/dL   GFR calc non Af Amer >90  >90 mL/min   GFR calc Af Amer >90  >90 mL/min   Comment: (NOTE)  The eGFR has been calculated using the CKD EPI equation.     This calculation has not been validated in all clinical situations.     eGFR's persistently <90 mL/min signify possible Chronic Kidney     Disease.   Anion gap 12  5 - 15  CBC     Status: Abnormal   Collection Time    10/19/13  4:05 AM      Result Value Ref Range   WBC 18.6 (*) 4.0 - 10.5 K/uL   RBC 3.14 (*) 3.87 - 5.11 MIL/uL   Hemoglobin 7.2 (*) 12.0 - 15.0 g/dL   HCT 23.2 (*) 36.0 - 46.0 %   MCV 73.9 (*) 78.0 - 100.0 fL   MCH 22.9 (*) 26.0 - 34.0 pg   MCHC 31.0  30.0 - 36.0 g/dL   RDW 19.9 (*) 11.5 - 15.5 %   Platelets 424 (*) 150 - 400 K/uL  GLUCOSE, CAPILLARY     Status: Abnormal   Collection Time    10/19/13  5:14 AM      Result Value Ref Range   Glucose-Capillary 122 (*) 70 - 99 mg/dL  GLUCOSE, CAPILLARY     Status: Abnormal   Collection Time    10/19/13 12:24 PM      Result Value Ref Range   Glucose-Capillary 117 (*) 70 - 99 mg/dL  VANCOMYCIN, TROUGH     Status: Abnormal   Collection  Time    10/19/13  1:00 PM      Result Value Ref Range   Vancomycin Tr 20.6 (*) 10.0 - 20.0 ug/mL      Component Value Date/Time   SDES SPUTUM 10/14/2013 1459   SDES SPUTUM 10/14/2013 1459   SPECREQUEST NONE 10/14/2013 1459   SPECREQUEST NONE 10/14/2013 1459   CULT  Value: NORMAL OROPHARYNGEAL FLORA Performed at [facility] 10/14/2013 1459   REPTSTATUS 10/14/2013 FINAL 10/14/2013 1459   REPTSTATUS 10/16/2013 FINAL 10/14/2013 1459   Dg Chest Port 1 View  10/18/2013   CLINICAL DATA:  Chest pain  EXAM: PORTABLE CHEST - 1 VIEW  COMPARISON:  October 14, 2013  FINDINGS: Nasogastric tube is been removed. Central catheter tip is at the cavoatrial junction. No pneumothorax. There is consolidation in the left base with small left effusion. There is atelectatic change in the right base. Heart is mildly enlarged with normal pulmonary vascularity. No adenopathy.  IMPRESSION: Airspace consolidation left lower lobe with small left effusion. Right base atelectasis. Central catheter tip at cavoatrial junction. No pneumothorax. No change in cardiac silhouette.   Electronically Signed   By: Lowella Grip M.D.   On: 10/18/2013 14:16   Recent Results (from the past 240 hour(s))  MRSA PCR SCREENING     Status: None   Collection Time    10/10/13  3:39 PM      Result Value Ref Range Status   MRSA by PCR NEGATIVE  NEGATIVE Final   Comment:            The GeneXpert MRSA Assay (FDA     approved for NASAL specimens     only), is one component of a     comprehensive MRSA colonization     surveillance program. It is not     intended to diagnose MRSA     infection nor to guide or     monitor treatment for     MRSA infections.  CULTURE, BLOOD (ROUTINE X 2)     Status: None   Collection  Time    10/14/13 11:50 AM      Result Value Ref Range Status   Specimen Description BLOOD LEFT ARM   Final   Special Requests BOTTLES DRAWN AEROBIC AND ANAEROBIC [facility] EACH   Final   Culture  Setup Time     Final   Value:  10/14/2013 14:17     Performed at [facility]   Culture     Final   Value:        BLOOD CULTURE RECEIVED NO GROWTH TO DATE CULTURE WILL BE HELD FOR 5 DAYS BEFORE ISSUING A FINAL NEGATIVE REPORT     Performed at [facility]   Report Status PENDING   Incomplete  CULTURE, BLOOD (ROUTINE X 2)     Status: None   Collection Time    10/14/13 11:53 AM      Result Value Ref Range Status   Specimen Description BLOOD LEFT HAND   Final   Special Requests BOTTLES DRAWN AEROBIC AND ANAEROBIC 5CC EACH   Final   Culture  Setup Time     Final   Value: 10/14/2013 14:17     Performed at [facility]   Culture     Final   Value:        BLOOD CULTURE RECEIVED NO GROWTH TO DATE CULTURE WILL BE HELD FOR 5 DAYS BEFORE ISSUING A FINAL NEGATIVE REPORT     Performed at [facility]   Report Status PENDING   Incomplete  URINE CULTURE     Status: None   Collection Time    10/14/13 12:09 PM      Result Value Ref Range Status   Specimen Description URINE, CATHETERIZED   Final   Special Requests NONE   Final   Culture  Setup Time     Final   Value: 10/14/2013 17:48     Performed at [facility]     Final   Value: NO GROWTH     Performed at [facility]   Culture     Final   Value: NO GROWTH     Performed at [facility]   Report Status 10/15/2013 FINAL   Final  CULTURE, EXPECTORATED SPUTUM-ASSESSMENT     Status: None   Collection Time    10/14/13  2:59 PM      Result Value Ref Range Status   Specimen Description SPUTUM   Final   Special Requests NONE   Final   Sputum evaluation     Final   Value: THIS SPECIMEN IS ACCEPTABLE. RESPIRATORY CULTURE REPORT TO FOLLOW.   Report Status 10/14/2013 FINAL   Final  CULTURE, RESPIRATORY (NON-EXPECTORATED)     Status: None   Collection Time    10/14/13  2:59 PM      Result Value Ref Range Status   Specimen Description SPUTUM   Final   Special Requests NONE   Final   Gram Stain     Final    Value: RARE WBC PRESENT, PREDOMINANTLY MONONUCLEAR     FEW SQUAMOUS EPITHELIAL CELLS PRESENT     MODERATE GRAM POSITIVE COCCI IN PAIRS     IN CLUSTERS FEW GRAM POSITIVE RODS     RARE GRAM NEGATIVE RODS   Culture     Final   Value: NORMAL OROPHARYNGEAL FLORA     Performed at [facility]   Report Status 10/16/2013 FINAL   Final      10/19/2013,  3:44 PM     LOS: 11 days     **Disclaimer: This note may have been dictated with voice recognition software. Similar sounding words can inadvertently be transcribed and this note may contain transcription errors which may not have been corrected upon publication of note.**

## 2013-10-19 NOTE — Progress Notes (Signed)
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 Azithromycin Rash  . Sulfa Antibiotics Rash  . Hydrocodone-Acetaminophen Nausea And Vomiting    Hallucinating   . 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 SHE DOES NOT WANT ANY BLOOD PRODUCTS OR FRACTIONS   Patient Measurements: Height: 5\' 6"  (167.6 cm) Weight: 157 lb 3.2 oz (71.305 kg) IBW/kg (Calculated) : 59.3  Vital Signs: Temp: 98.7 F (37.1 C) (07/26 0513) Temp src: Oral (07/26 0513) BP: 121/62 mmHg (07/26 0513) Pulse Rate: 112 (07/26 0513) Intake/Output from previous day: 07/25 0701 - 07/26 0700 In: 3810 [I.V.:960; IV Piggyback:770] Out: -  Intake/Output from this shift:    Labs:  Recent Labs  10/17/13 0620 10/18/13 0455 10/19/13 0405  WBC 16.1* 17.8* 18.6*  HGB 7.2* 7.2* 7.2*  HCT 23.2* 23.6* 23.2*  PLT 248 346 424*    Recent Labs  10/17/13 0620 10/18/13 0455 10/19/13 0405  NA 133* 134* 134*  K 3.8 3.7 3.8  CL 98 100 100  CO2 24 24 22   GLUCOSE 138* 116* 107*  BUN 8 9 9   CREATININE 0.44* 0.44* 0.48*  CALCIUM 8.6 8.8 8.7   Estimated Creatinine Clearance: 94.6 ml/min (by C-G formula based on Cr of 0.48).    Recent Labs  10/18/13 1746 10/18/13 2315 10/19/13 0514  GLUCAP 122* 121* 122*   Medications:  Scheduled:  . antiseptic oral rinse  15 mL Mouth Rinse q12n4p  . ceFEPime (MAXIPIME) IV  1 g Intravenous 3 times per day  . chlorhexidine  15 mL Mouth Rinse BID  . insulin aspart  0-9 Units Subcutaneous 4 times per day  . methocarbamol (ROBAXIN) IV  1,000 mg Intravenous Q8H  . metoCLOPramide (REGLAN) injection  5 mg Intravenous 3 times per day  . ondansetron (ZOFRAN) IV  4 mg Intravenous 4 times per day  . pantoprazole (PROTONIX) IV  40 mg Intravenous QHS  . sodium chloride  10-40 mL Intracatheter Q12H  . vancomycin  1,250 mg Intravenous 3 times per day   Infusions:  . sodium chloride 30 mL/hr at  10/17/13 0514  . Marland KitchenTPN (CLINIMIX-E) Adult 80 mL/hr at 10/18/13 1742   And  . fat emulsion 250 mL (10/18/13 1742)     Nutritional Goals:   RD recs (7/23): Kcal: 2000-2200, Protein: 85-100 gram, Fluid: >/=2100 ml/daily  Per Dietitian recommendations and pt noting feelings of hunger/thirst, agree to increase kCal and adjust TPN accordingly (7/24)  Clinimix 5/20 at a goal rate of 80 ml/hr + lipids to provide: 96 g/day protein, 2169 Kcal/day.  Current nutrition:   Diet: NPO  IVF: NS @ 30 ml/hr  Clinimix + Lipids at goal rate for total of 90 ml/hr  CBGs & Insulin requirements past 24 hours:   CBGs: 121-122  Novolog sensitive scale q4hr - 6 units  Assessment:  17 YOF presents with N/V, weight loss d/t difficulty eating with recent dx of Gastric cancer. EGD on 09/22/2013 reveald findings of a partially obstructing oozing cratered gastric ulcer in the gastric antrum. 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 Pharmacy consulted to start TPN 7/16 for GOO. Surgery consulted, s/p subtotal gastrectomy with gastric jejunostomy on 7/17.  UGI series 7/22 revealed no evidence of anastomotic leak; slow transit of contrast from the stomach to the jejunostomy loop.  Pt tolerated 24 hr clamping of NG tube with planned removal on 7/26, remains NPO.  Renal / Hepatic function: SCr remains low/stable, LFTs WNL  Electrolytes: Na low (unable to adjust in premix TNA), Corr Ca 10.62 (sl elevated), other WNL.    Pre-Albumin: 3.1 (7/20), 7.9 (7/17) - reflects depleted protein stores  TG:   146 (7/20), 94 (7/17), - increased, will continue to monitor  Glucose: at goal  <150 mg/dl, no hypoglycemia  TPN Access: PICC (placed 7/16)  TPN day#:  11  Plan:  At 1800 today:  Continue Clinimix E 5/20 at 80 ml/hr.  20% fat emulsion at 10 ml/hr.  TNA to contain standard multivitamins and trace elements daily.  Continue SSI q6h  TNA lab panels on Mondays &  Thursdays.  F/u daily.   Gretta Arab PharmD, BCPS Pager [phone] 10/19/2013 8:03 AM

## 2013-10-19 NOTE — Progress Notes (Addendum)
Patient ID: Andrea Best, female   DOB: January 07, 1971, 43 y.o.   MRN: [id number] [facility] PROGRESS NOTE  Andrea Best NWG:[id number] DOB: Jun 25, 1970 DOA: 10/08/2013 PCP: Conni Slipper, MD  Brief narrative: 43 y.o. 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 She underwent subtotal gastrectomy with porta hepatis and peripancreatic lymph node dissection with a Roux-en-Y gastrojejunostomy on 10/10/13. Hospital course is complicated due to development of HCAP for which she is on maxipime and vancomycin. ID consulted to help with antibiotic management and persistent leukocytosis.  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 Was in SDU post-op. Remains stable on medical floor.  Patient required NG tube post-operatively but it was discontinued 10/18/2013. PO intake has not started yet.  Dr. Benay Spice saw the patient in consultation. Possible consideration for adjuvant chemotherapy. Continue pain control efforts: robaxin 1000 mg every 8 hours IV; dilaudid 1 mg IV every 5 hours PRN Active Problems:  Postoperative Fever/tachycardia / HCAP   CXR was suggestive of developing PNA in left lower lobe. Pt started on Levaquin but this was changed to maxipime and vanco on 10/14/13. Repeat CXR 10/18/2013 still shows consolidation in left lower lobe. I asked ID to help with antibiotic recommendation considering persistent left lower lobe consolidation and leukocytosis. Hypomagnesemia / hypophosphatemia /hypokalemia  Replete as needed. 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  Hemoglobin ranges 7.2-8.0  Protein-calorie malnutrition, severe  Pt meets criteria for severe MALNUTRITION in the context of chronic illness as evidenced by PO intake < 75% for > one month, 16% body weight loss in 4 months.  PICC line placed for TNA administration which will likely continue even if able to have PO intake. Lupus/Sjogren syndrome  Plaquenil and prednisone currently on hold. Will resume once PO intake starts. DVT Prophylaxis  Hold heparin or Lovenox due to drop in Hg  SCD's ordered for DVT prophylaxis   Code Status: Full.  Family Communication: Mother updated at the bedside daily.  Disposition Plan: home when stable    IV access:  PICC placed 10/09/13. Consultants:  Dr. Betsy Coder, Oncology.  Dr. Excell Seltzer, Surgery Dt. Bobby Rumpf, Infectious disease  Other consultants:  Dietician  Pharmacy Procedures:  Chest x-ray 10/08/13: No active disease.  Subtotal gastrectomy with porta hepatis and peripancreatic lymph node dissection, Roux-en-Y gastrojejunostomy 10/10/13 by Dr. Excell Seltzer.  Chest x-ray 10/12/13 CXR 10/18/2013 Antibiotics:  Levaquin 7/19 --> 7/21  Maxipime 7/21 -->  Vancomycin 7/21 -->   [name], MD  [facility] Pager [phone]  If 7PM-7AM, please contact night-coverage [email or web address] Password [email or web address] 10/19/2013, 8:24 AM   LOS: 11 days    HPI/Subjective: No acute overnight events.  Objective: Filed Vitals:   10/18/13 2218 10/18/13 2230 10/19/13 0130 10/19/13 0513  BP: 117/58   121/62  Pulse: 128   112  Temp: 100.1 F (37.8 C) 100 F (37.8 C) 99.6 F (37.6 C) 98.7 F (37.1 C)  TempSrc: Oral Oral Oral Oral  Resp: 18   20  Height:      Weight:      SpO2: 100%   99%  Intake/Output Summary (Last 24 hours) at 10/19/13 0824 Last data filed at 10/19/13 0700  Gross per 24 hour  Intake   1730 ml  Output      0 ml   Net   1730 ml    Exam:   General:  Pt is alert, follows commands appropriately, not in acute distress  Cardiovascular: Regular rate and rhythm, S1/S2, no murmurs  Respiratory: some rhonchi on the left side otherwise no wheezing  Abdomen: tender in mid abdomen but no guarding, no rebound, (+) BS  Extremities: No edema, pulses DP and PT palpable bilaterally  Neuro: Grossly nonfocal  Data Reviewed: Basic Metabolic Panel:  Recent Labs Lab 10/13/13 0540 10/14/13 0450 10/15/13 0447 10/16/13 0530 10/17/13 0620 10/18/13 0455 10/19/13 0405  NA 134* 130* 134* 134* 133* 134* 134*  K 4.0 3.7 3.6* 3.6* 3.8 3.7 3.8  CL 100 96 97 99 98 100 100  CO2 26 25 25 26 24 24 22   GLUCOSE 127* 140* 137* 117* 138* 116* 107*  BUN 6 8 8 7 8 9 9   CREATININE 0.44* 0.47* 0.46* 0.38* 0.44* 0.44* 0.48*  CALCIUM 8.4 8.5 8.6 8.8 8.6 8.8 8.7  MG 1.7 1.8  --  2.0  --   --   --   PHOS 2.9  --   --  4.4  --   --   --    Liver Function Tests:  Recent Labs Lab 10/13/13 0540 10/14/13 0450 10/16/13 0530  AST 22 15 29   ALT 12 10 19   ALKPHOS 67 85 165*  BILITOT 0.2* <0.2* 0.3  PROT 6.2 6.8 7.0  ALBUMIN 1.6* 1.6* 1.6*   No results found for this basename: LIPASE, AMYLASE,  in the last 168 hours No results found for this basename: AMMONIA,  in the last 168 hours CBC:  Recent Labs Lab 10/13/13 0540  10/15/13 0447 10/16/13 0530 10/17/13 0620 10/18/13 0455 10/19/13 0405  WBC 17.2*  < > 18.1* 18.6* 16.1* 17.8* 18.6*  NEUTROABS 14.5*  --   --   --   --   --   --   HGB 7.7*  < > 7.8* 7.6* 7.2* 7.2* 7.2*  HCT 25.1*  < > 25.0* 24.6* 23.2* 23.6* 23.2*  MCV 72.8*  < > 72.9* 73.2* 73.4* 74.2* 73.9*  PLT 161  < > 205 207 248 346 424*  < > = values in this interval not displayed. Cardiac Enzymes:  Recent Labs Lab 10/16/13 2230 10/17/13 0620  TROPONINI <0.30 <0.30   BNP: No components found with this basename: POCBNP,  CBG:  Recent Labs Lab 10/17/13 2351 10/18/13 0532 10/18/13 1746  10/18/13 2315 10/19/13 0514  GLUCAP 148* 132* 122* 121* 122*    MRSA PCR SCREENING     Status: None   Collection Time    10/10/13  3:39 PM      Result Value Ref Range Status   MRSA by PCR NEGATIVE  NEGATIVE Final  CULTURE, BLOOD (ROUTINE X 2)     Status: None   Collection Time    10/14/13 11:50 AM      Result Value Ref Range Status   Specimen Description BLOOD LEFT ARM   Final   Value:        BLOOD CULTURE RECEIVED NO GROWTH TO DATE CULTURE WILL BE HELD FOR 5 DAYS BEFORE ISSUING A FINAL NEGATIVE REPORT     Performed at [facility]   Report Status PENDING   Incomplete  CULTURE, BLOOD (ROUTINE X  2)     Status: None   Collection Time    10/14/13 11:53 AM      Result Value Ref Range Status   Specimen Description BLOOD LEFT HAND   Final   Value:        BLOOD CULTURE RECEIVED NO GROWTH TO DATE CULTURE WILL BE HELD FOR 5 DAYS BEFORE ISSUING A FINAL NEGATIVE REPORT     Performed at [facility]   Report Status PENDING   Incomplete  URINE CULTURE     Status: None   Collection Time    10/14/13 12:09 PM      Result Value Ref Range Status   Specimen Description URINE, CATHETERIZED   Final   Value: NO GROWTH     Performed at [facility]   Report Status 10/15/2013 FINAL   Final  CULTURE, RESPIRATORY (NON-EXPECTORATED)     Status: None   Collection Time    10/14/13  2:59 PM      Result Value Ref Range Status   Specimen Description SPUTUM   Final   Value: NORMAL OROPHARYNGEAL FLORA     Performed at [facility]   Report Status 10/16/2013 FINAL   Final     Studies: Dg Chest Port 1 View 10/18/2013   IMPRESSION: Airspace consolidation left lower lobe with small left effusion. Right base atelectasis. Central catheter tip at cavoatrial junction. No pneumothorax. No change in cardiac silhouette.   Electronically Signed   By: Lowella Grip M.D.   On: 10/18/2013 14:16    Scheduled Meds: . ceFEPime (MAXIPIME) IV  1 g Intravenous 3 times per day  .  insulin aspart  0-9 Units Subcutaneous 4 times per day  . methocarbamol (ROBAXIN) IV  1,000 mg Intravenous Q8H  . metoCLOPramide (REGLAN) injection  5 mg Intravenous 3 times per day  . ondansetron (ZOFRAN)   4 mg Intravenous 4 times per day  . pantoprazole (PROTONIX) IV  40 mg Intravenous QHS  . vancomycin  1,250 mg Intravenous 3 times per day   Continuous Infusions: . sodium chloride 30 mL/hr at 10/17/13 0514  . Marland KitchenTPN (CLINIMIX-E) Adult 80 mL/hr at 10/18/13 1742   And  . fat emulsion 250 mL (10/18/13 1742)  . Marland KitchenTPN (CLINIMIX-E) Adult     And  . fat emulsion

## 2013-10-19 NOTE — Progress Notes (Signed)
Patient ID: Andrea Best, female   DOB: 06/16/1970, 43 y.o.   MRN: [id number] 9 Days Post-Op  Subjective: Feels "great". Denies abdominal pain or nausea. No chest pain or shortness of breath. Tolerating NG out. Had small bowel movement.  Objective: Vital signs in last 24 hours: Temp:  [98.7 F (37.1 C)-100.1 F (37.8 C)] 98.7 F (37.1 C) (07/26 0513) Pulse Rate:  [112-128] 112 (07/26 0513) Resp:  [18-20] 20 (07/26 0513) BP: (117-127)/(58-91) 121/62 mmHg (07/26 0513) SpO2:  [99 %-100 %] 99 % (07/26 0513) Last BM Date: 10/18/13  Intake/Output from previous day: 07/25 0701 - 07/26 0700 In: 1730 [I.V.:960; IV Piggyback:770] Out: -  Intake/Output this shift:    General appearance: alert, cooperative and no distress Resp: no wheezing or increased work of breathing GI: normal findings: soft, non-tender Incision/Wound: clean and dry without evidence of infection  Lab Results:   Recent Labs  10/18/13 0455 10/19/13 0405  WBC 17.8* 18.6*  HGB 7.2* 7.2*  HCT 23.6* 23.2*  PLT 346 424*   BMET  Recent Labs  10/18/13 0455 10/19/13 0405  NA 134* 134*  K 3.7 3.8  CL 100 100  CO2 24 22  GLUCOSE 116* 107*  BUN 9 9  CREATININE 0.44* 0.48*  CALCIUM 8.8 8.7     Studies/Results: Dg Chest Port 1 View  10/18/2013   CLINICAL DATA:  Chest pain  EXAM: PORTABLE CHEST - 1 VIEW  COMPARISON:  October 14, 2013  FINDINGS: Nasogastric tube is been removed. Central catheter tip is at the cavoatrial junction. No pneumothorax. There is consolidation in the left base with small left effusion. There is atelectatic change in the right base. Heart is mildly enlarged with normal pulmonary vascularity. No adenopathy.  IMPRESSION: Airspace consolidation left lower lobe with small left effusion. Right base atelectasis. Central catheter tip at cavoatrial junction. No pneumothorax. No change in cardiac silhouette.   Electronically Signed   By: Lowella Grip M.D.   On: 10/18/2013 14:16     Anti-infectives: Anti-infectives   Start     Dose/Rate Route Frequency Ordered Stop   10/17/13 2315  vancomycin (VANCOCIN) 1,250 mg in sodium chloride 0.9 % 250 mL IVPB     1,250 mg 166.7 mL/hr over 90 Minutes Intravenous 3 times per day 10/17/13 2303     10/16/13 0830  vancomycin (VANCOCIN) IVPB 1000 mg/200 mL premix  Status:  Discontinued     1,000 mg 200 mL/hr over 60 Minutes Intravenous 3 times per day 10/16/13 0730 10/17/13 2303   10/14/13 2200  vancomycin (VANCOCIN) IVPB 750 mg/150 ml premix  Status:  Discontinued     750 mg 150 mL/hr over 60 Minutes Intravenous Every 8 hours 10/14/13 1158 10/16/13 0730   10/14/13 1400  ceFEPIme (MAXIPIME) 1 g in dextrose 5 % 50 mL IVPB     1 g 100 mL/hr over 30 Minutes Intravenous 3 times per day 10/14/13 1125 10/22/13 1359   10/14/13 1400  vancomycin (VANCOCIN) 1,500 mg in sodium chloride 0.9 % 500 mL IVPB     1,500 mg 250 mL/hr over 120 Minutes Intravenous  Once 10/14/13 1158 10/14/13 1658   10/12/13 1600  levofloxacin (LEVAQUIN) IVPB 500 mg  Status:  Discontinued     500 mg 100 mL/hr over 60 Minutes Intravenous Every 24 hours 10/12/13 1545 10/14/13 1124   10/10/13 1230  ceFAZolin (ANCEF) IVPB 2 g/50 mL premix     2 g 100 mL/hr over 30 Minutes Intravenous  Once 10/10/13 1220 10/10/13  1222   10/10/13 0600  [MAR Hold]  ceFAZolin (ANCEF) IVPB 2 g/50 mL premix     (On MAR Hold since 10/10/13 0659)   2 g 100 mL/hr over 30 Minutes Intravenous On call to O.R. 10/09/13 1244 10/10/13 0743      Assessment/Plan: s/p Procedure(s): SUBTOTAL GASTRECTOMY WITH GASTRIC JEJUNOSOTMY No apparent abdominal problems clinically. Hemoglobin stable She has persistent leukocytosis and mild tachycardia and low-grade temp but this could be explained on the basis of left lower lobe pneumonia. On Maxipime and vancomycin for pneumonia. Start clear liquid diet.    LOS: 11 days    Markey Deady T 10/19/2013

## 2013-10-20 ENCOUNTER — Inpatient Hospital Stay (HOSPITAL_COMMUNITY): Payer: 59

## 2013-10-20 ENCOUNTER — Encounter (HOSPITAL_COMMUNITY): Payer: Self-pay | Admitting: Radiology

## 2013-10-20 DIAGNOSIS — J189 Pneumonia, unspecified organism: Secondary | ICD-10-CM | POA: Diagnosis not present

## 2013-10-20 DIAGNOSIS — R509 Fever, unspecified: Secondary | ICD-10-CM

## 2013-10-20 DIAGNOSIS — D72829 Elevated white blood cell count, unspecified: Secondary | ICD-10-CM | POA: Diagnosis not present

## 2013-10-20 LAB — CBC
HEMATOCRIT: 23.7 % — AB (ref 36.0–46.0)
HEMOGLOBIN: 7.3 g/dL — AB (ref 12.0–15.0)
MCH: 23 pg — ABNORMAL LOW (ref 26.0–34.0)
MCHC: 30.8 g/dL (ref 30.0–36.0)
MCV: 74.8 fL — ABNORMAL LOW (ref 78.0–100.0)
Platelets: 514 10*3/uL — ABNORMAL HIGH (ref 150–400)
RBC: 3.17 MIL/uL — AB (ref 3.87–5.11)
RDW: 20.1 % — ABNORMAL HIGH (ref 11.5–15.5)
WBC: 22.1 10*3/uL — ABNORMAL HIGH (ref 4.0–10.5)

## 2013-10-20 LAB — COMPREHENSIVE METABOLIC PANEL
ALT: 131 U/L — ABNORMAL HIGH (ref 0–35)
AST: 142 U/L — AB (ref 0–37)
Albumin: 1.8 g/dL — ABNORMAL LOW (ref 3.5–5.2)
Alkaline Phosphatase: 481 U/L — ABNORMAL HIGH (ref 39–117)
Anion gap: 12 (ref 5–15)
BUN: 9 mg/dL (ref 6–23)
CALCIUM: 8.7 mg/dL (ref 8.4–10.5)
CHLORIDE: 100 meq/L (ref 96–112)
CO2: 22 meq/L (ref 19–32)
Creatinine, Ser: 0.47 mg/dL — ABNORMAL LOW (ref 0.50–1.10)
GFR calc Af Amer: >90 mL/min (ref >90)
Glucose, Bld: 108 mg/dL — ABNORMAL HIGH (ref 70–99)
Potassium: 3.8 mEq/L (ref 3.7–5.3)
SODIUM: 134 meq/L — AB (ref 137–147)
Total Bilirubin: <0.2 mg/dL — ABNORMAL LOW (ref 0.3–1.2)
Total Protein: 7.7 g/dL (ref 6.0–8.3)

## 2013-10-20 LAB — CULTURE, BLOOD (ROUTINE X 2)
CULTURE: NO GROWTH
Culture: NO GROWTH

## 2013-10-20 LAB — DIFFERENTIAL
BASOS PCT: 1 % (ref 0–1)
Basophils Absolute: 0.2 10*3/uL — ABNORMAL HIGH (ref 0.0–0.1)
EOS ABS: 0.2 10*3/uL (ref 0.0–0.7)
EOS PCT: 1 % (ref 0–5)
LYMPHS PCT: 7 % — AB (ref 12–46)
Lymphs Abs: 1.5 10*3/uL (ref 0.7–4.0)
MONOS PCT: 9 % (ref 3–12)
Monocytes Absolute: 2 10*3/uL — ABNORMAL HIGH (ref 0.1–1.0)
Neutro Abs: 18.2 10*3/uL — ABNORMAL HIGH (ref 1.7–7.7)
Neutrophils Relative %: 82 % — ABNORMAL HIGH (ref 43–77)

## 2013-10-20 LAB — PHOSPHORUS: PHOSPHORUS: 4.5 mg/dL (ref 2.3–4.6)

## 2013-10-20 LAB — HIV ANTIBODY (ROUTINE TESTING W REFLEX): HIV 1&2 Ab, 4th Generation: NONREACTIVE

## 2013-10-20 LAB — LIPASE, BLOOD: Lipase: 151 U/L — ABNORMAL HIGH (ref 11–59)

## 2013-10-20 LAB — GLUCOSE, CAPILLARY
GLUCOSE-CAPILLARY: 122 mg/dL — AB (ref 70–99)
Glucose-Capillary: 129 mg/dL — ABNORMAL HIGH (ref 70–99)
Glucose-Capillary: 157 mg/dL — ABNORMAL HIGH (ref 70–99)
Glucose-Capillary: 120 mg/dL — ABNORMAL HIGH (ref 70–99)

## 2013-10-20 LAB — PREALBUMIN: PREALBUMIN: 15.2 mg/dL — AB (ref 17.0–34.0)

## 2013-10-20 LAB — TRIGLYCERIDES: TRIGLYCERIDES: 130 mg/dL (ref <150)

## 2013-10-20 LAB — MAGNESIUM: Magnesium: 1.9 mg/dL (ref 1.5–2.5)

## 2013-10-20 MED ORDER — TRACE MINERALS CR-CU-F-FE-I-MN-MO-SE-ZN IV SOLN
INTRAVENOUS | Status: AC
  Administered 2013-10-20: 18:00:00 via INTRAVENOUS
  Filled 2013-10-20: qty 2000

## 2013-10-20 MED ORDER — FAT EMULSION 20 % IV EMUL
250.0000 mL | INTRAVENOUS | Status: AC
  Administered 2013-10-20: 250 mL via INTRAVENOUS
  Filled 2013-10-20: qty 250

## 2013-10-20 MED ORDER — ENOXAPARIN SODIUM 40 MG/0.4ML ~~LOC~~ SOLN
40.0000 mg | SUBCUTANEOUS | Status: DC
  Administered 2013-10-20: 40 mg via SUBCUTANEOUS
  Filled 2013-10-20: qty 0.4

## 2013-10-20 MED ORDER — HYDROXYCHLOROQUINE SULFATE 200 MG PO TABS
200.0000 mg | ORAL_TABLET | Freq: Two times a day (BID) | ORAL | Status: DC
  Administered 2013-10-20 – 2013-10-27 (×14): 200 mg via ORAL
  Filled 2013-10-20 (×17): qty 1

## 2013-10-20 MED ORDER — SODIUM CHLORIDE 0.9 % IV SOLN
500.0000 mg | Freq: Four times a day (QID) | INTRAVENOUS | Status: DC
  Administered 2013-10-20 – 2013-10-27 (×27): 500 mg via INTRAVENOUS
  Filled 2013-10-20 (×29): qty 500

## 2013-10-20 MED ORDER — HYDROMORPHONE HCL PF 1 MG/ML IJ SOLN
1.0000 mg | Freq: Once | INTRAMUSCULAR | Status: AC
  Administered 2013-10-20: 1 mg via INTRAVENOUS
  Filled 2013-10-20: qty 1

## 2013-10-20 MED ORDER — PREDNISONE 5 MG PO TABS
5.0000 mg | ORAL_TABLET | Freq: Two times a day (BID) | ORAL | Status: DC
Start: 2013-10-20 — End: 2013-10-27
  Administered 2013-10-20 – 2013-10-27 (×15): 5 mg via ORAL
  Filled 2013-10-20 (×17): qty 1

## 2013-10-20 MED ORDER — BISACODYL 10 MG RE SUPP: 10.0000 mg | Freq: Every day | RECTAL | Status: DC | PRN

## 2013-10-20 MED ORDER — IOHEXOL 350 MG/ML SOLN
100.0000 mL | Freq: Once | INTRAVENOUS | Status: AC | PRN
  Administered 2013-10-20: 100 mL via INTRAVENOUS

## 2013-10-20 MED ORDER — HYDROMORPHONE HCL PF 2 MG/ML IJ SOLN
2.0000 mg | INTRAMUSCULAR | Status: DC
  Administered 2013-10-20 – 2013-10-21 (×7): 2 mg via INTRAVENOUS
  Filled 2013-10-20 (×7): qty 1

## 2013-10-20 MED ORDER — IOHEXOL 300 MG/ML  SOLN
25.0000 mL | Freq: Once | INTRAMUSCULAR | Status: AC | PRN
  Administered 2013-10-20: 25 mL via ORAL

## 2013-10-20 NOTE — Progress Notes (Signed)
Physical Therapy Treatment Patient Details Name: Andrea Best MRN: [id number] DOB: September 01, 1970 Today's Date: 10/20/2013    History of Present Illness 43 y.o. 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 Pt s/p subtotal gastrectomy with porta hepatis and peripancreatic lymph node dissection with a Roux-en-Y gastrojejunostomy on 10/10/13.     PT Comments    Discussed PT with RN prior to visit.  RN reports elevated HR today and at rest 120 prior to entering room however pt wished to ambulate today and agreeable to short distance only.  Pt's HR elevated to 145 bpm with 140 feet of ambulation.   Follow Up Recommendations  Home health PT;Supervision for mobility/OOB     Equipment Recommendations  None recommended by PT    Recommendations for Other Services       Precautions / Restrictions Precautions Precautions: Fall Precaution Comments: R drain    Mobility  Bed Mobility Overal bed mobility: Needs Assistance Bed Mobility: Supine to Sit;Sit to Supine     Supine to sit: Supervision Sit to supine: Supervision   General bed mobility comments: supervision for lines/drain  Transfers Overall transfer level: Needs assistance Equipment used: None Transfers: Sit to/from Stand Sit to Stand: Supervision            Ambulation/Gait Ambulation/Gait assistance: Min guard Ambulation Distance (Feet): 140 Feet Assistive device:  (IV pole) Gait Pattern/deviations: Step-through pattern Gait velocity: decr   General Gait Details: PT limited distance due to elevated HR today and at rest, HR during ambulation up to 145 bpm and pt reports mild dyspnea however SpO2 99% room air once returning to room, pt used IV pole for support today   Stairs            Wheelchair Mobility    Modified Rankin (Stroke Patients Only)       Balance                                     Cognition Arousal/Alertness: Awake/alert Behavior During Therapy: WFL for tasks assessed/performed Overall Cognitive Status: Within Functional Limits for tasks assessed                      Exercises      General Comments        Pertinent Vitals/Pain Tachycardia per above (mobility), RN aware and okay with mobility    Home Living                      Prior Function            PT Goals (current goals can now be found in the care plan section) Acute Rehab PT Goals PT Goal Formulation: With patient Time For Goal Achievement: 10/27/13 Potential to Achieve Goals: Good Progress towards PT goals: Progressing toward goals    Frequency  Min 3X/week    PT Plan Current plan remains appropriate    Co-evaluation             End of Session   Activity Tolerance: Patient tolerated treatment well Patient left: in bed;with call bell/phone within reach     Time: 8315-1761 PT Time Calculation (min): 15 min  Charges:  $Gait Training: 8-22 mins                    G  CodesTrena Platt 11-06-13, 4:06 PM Carmelia Bake, PT, DPT 06-Nov-2013 Pager: [phone]

## 2013-10-20 NOTE — Progress Notes (Addendum)
[facility] PROGRESS NOTE  Andrea Best WJX:[id number] DOB: 08/16/1970 DOA: 10/08/2013 PCP: Conni Slipper, MD   Brief narrative:  43 y.o. 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 She underwent subtotal gastrectomy with porta hepatis and peripancreatic lymph node dissection with a Roux-en-Y gastrojejunostomy on 10/10/13. Hospital course was complicated due to development of HCAP for which she is on maxipime and vancomycin and now with persistent tachycardia, low grade fever and progressive leucocytosis.  Assessment/Plan: Fever/tachycardia/  And leucocytosis/ HCAP  -CXR was suggestive of developing PNA in left lower lobe post op on 7/19. Patient started on Levaquin but changed to maxipime and vanco on 10/14/13. Repeat CXR 10/18/2013 still showing consolidation in the left lower lobe.  -ID consult appreciated . Agree that given persistent tachycardia ( in 150s this am), cannot r/o underlying PE or DVT. Also patient has not been getting DVT prophylaxis for past few days due to drop in H&H. Wbc trending up and is 21 k today. Also could be flare up of lupus and patient has not been on prednisone she takes at home. .. Will obtain CT angio chest to r/ PE, CT abd and pelvis to r/o intraabdominal infection or abscess. Doppler LE to r/o DVT. -if diet can be advanced today, will remove PICC -cx so far negative. Continue current abx. -monitor wbc and temp closely.  Gastric outlet obstruction secondary to gastric cancer  -Status post subtotal gastrectomy with porta hepatis and peripancreatic lymph node dissection with a Roux-en-Y gastrojejunostomy on 10/10/13. Was in SDU post-op. Remains stable on telemetry and now tolerating clear liquids.. Patient required NG tube post-operatively but  removed on 7/25.  Dr. Benay Spice saw the patient in consultation. Plan on adjuvant chemotherapy.  -pain not controlled with current regimen of IV robaxin and prn dilaudid ( 1mg  q5 hr prn), will increase dilaudid dose to 2 mg q 4 hr prn. Advance diet as tolerated.  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 Check iron panel Hemoglobin ranges from 7-8.0 .   Protein-calorie malnutrition On TNA.    Lupus/Sjogren syndrome  Plaquenil and prednisone currently on hold. Will resume today.   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 Will need gyn follow up as outpt  DVT Prophylaxis  Start on sq lovenox for prophylaxis   Code Status: full code Family Communication: none at bedside Disposition Plan: home once clinically stable   Consultants:  ID   [facility]   Dr Learta Codding  Procedures:  Subtotal gastrectomy with porta hepatis and peripancreatic lymph node dissection, Roux-en-Y gastrojejunostomy 10/10/13 by Dr. Excell Seltzer.   Antibiotics:  Levaquin 7/19 --> 7/21  Maxipime 7/21 -->  Vancomycin 7/21 -->   HPI/Subjective: C/o pain from lateral left chest down to LLQ. Not improved on current mediation regimen. Afebrile. tolerating clear liquids, had BM yesterday  Objective: Filed Vitals:   10/20/13 0636  BP: 111/59  Pulse: 112  Temp: 98.4 F (36.9 C)  Resp: 18    Intake/Output Summary (Last 24 hours) at 10/20/13 0840 Last data filed at 10/20/13 0500  Gross per 24 hour  Intake   2926 ml  Output      0 ml  Net   2926 ml  Filed Weights   10/12/13 0400 10/13/13 0400 10/18/13 0548  Weight: 74.2 kg (163 lb 9.3 oz) 73.4 kg (161 lb 13.1 oz) 71.305 kg (157 lb 3.2 oz)    Exam:   General:  [name] in NAD  HEENT: pallor+, no icterus, moist mucosa  Chest: clear to auscultation  b/l, no added sounds  CVS: S17S2 tachycardic, no MRG  Abd: soft, midline staples intact, JP drain with milky discharge.BS+, LLQ tenderness  Ext: warm, no edema, no calf swelling or tenderness  CNS: alert and oriented  Data Reviewed: Basic Metabolic Panel:  Recent Labs Lab 10/14/13 0450  10/16/13 0530 10/17/13 0620 10/18/13 0455 10/19/13 0405 10/20/13 0500  NA 130*  < > 134* 133* 134* 134* 134*  K 3.7  < > 3.6* 3.8 3.7 3.8 3.8  CL 96  < > 99 98 100 100 100  CO2 25  < > 26 24 24 22 22   GLUCOSE 140*  < > 117* 138* 116* 107* 108*  BUN 8  < > 7 8 9 9 9   CREATININE 0.47*  < > 0.38* 0.44* 0.44* 0.48* 0.47*  CALCIUM 8.5  < > 8.8 8.6 8.8 8.7 8.7  MG 1.8  --  2.0  --   --   --  1.9  PHOS  --   --  4.4  --   --   --  4.5  < > = values in this interval not displayed. Liver Function Tests:  Recent Labs Lab 10/14/13 0450 10/16/13 0530 10/20/13 0500  AST 15 29 142*  ALT 10 19 131*  ALKPHOS 85 165* 481*  BILITOT <0.2* 0.3 <0.2*  PROT 6.8 7.0 7.7  ALBUMIN 1.6* 1.6* 1.8*    Recent Labs Lab 10/19/13 1655  LIPASE 121*  AMYLASE 173*   No results found for this basename: AMMONIA,  in the last 168 hours CBC:  Recent Labs Lab 10/16/13 0530 10/17/13 0620 10/18/13 0455 10/19/13 0405 10/20/13 0500  WBC 18.6* 16.1* 17.8* 18.6* 22.1*  NEUTROABS  --   --   --   --  18.2*  HGB 7.6* 7.2* 7.2* 7.2* 7.3*  HCT 24.6* 23.2* 23.6* 23.2* 23.7*  MCV 73.2* 73.4* 74.2* 73.9* 74.8*  PLT 207 248 346 424* 514*   Cardiac Enzymes:  Recent Labs Lab 10/16/13 2230 10/17/13 0620  TROPONINI <0.30 <0.30   BNP (last 3 results) No results found for this basename: PROBNP,  in the last 8760 hours CBG:  Recent Labs Lab 10/19/13 0514 10/19/13 1224 10/19/13 1808 10/19/13 2013 10/20/13 0617  GLUCAP 122* 117* 124* 119* 129*    Recent Results (from the past 240 hour(s))  MRSA PCR SCREENING     Status: None   Collection Time    10/10/13  3:39 PM      Result Value Ref Range Status    MRSA by PCR NEGATIVE  NEGATIVE Final   Comment:            The GeneXpert MRSA Assay (FDA     approved for NASAL specimens     only), is one component of a     comprehensive MRSA colonization     surveillance program. It is not     intended to diagnose MRSA     infection nor to guide or     monitor treatment for     MRSA infections.  CULTURE, BLOOD (ROUTINE X 2)     Status: None   Collection Time    10/14/13 11:50  AM      Result Value Ref Range Status   Specimen Description BLOOD LEFT ARM   Final   Special Requests BOTTLES DRAWN AEROBIC AND ANAEROBIC [facility] EACH   Final   Culture  Setup Time     Final   Value: 10/14/2013 14:17     Performed at [facility]   Culture     Final   Value: NO GROWTH 5 DAYS     Performed at [facility]   Report Status 10/20/2013 FINAL   Final  CULTURE, BLOOD (ROUTINE X 2)     Status: None   Collection Time    10/14/13 11:53 AM      Result Value Ref Range Status   Specimen Description BLOOD LEFT HAND   Final   Special Requests BOTTLES DRAWN AEROBIC AND ANAEROBIC [facility] EACH   Final   Culture  Setup Time     Final   Value: 10/14/2013 14:17     Performed at [facility]   Culture     Final   Value: NO GROWTH 5 DAYS     Performed at [facility]   Report Status 10/20/2013 FINAL   Final  URINE CULTURE     Status: None   Collection Time    10/14/13 12:09 PM      Result Value Ref Range Status   Specimen Description URINE, CATHETERIZED   Final   Special Requests NONE   Final   Culture  Setup Time     Final   Value: 10/14/2013 17:48     Performed at [place]     Final   Value: NO GROWTH     Performed at [facility]   Culture     Final   Value: NO GROWTH     Performed at [facility]   Report Status 10/15/2013 FINAL   Final  CULTURE, EXPECTORATED SPUTUM-ASSESSMENT     Status: None   Collection Time    10/14/13  2:59 PM      Result Value Ref Range Status   Specimen  Description SPUTUM   Final   Special Requests NONE   Final   Sputum evaluation     Final   Value: THIS SPECIMEN IS ACCEPTABLE. RESPIRATORY CULTURE REPORT TO FOLLOW.   Report Status 10/14/2013 FINAL   Final  CULTURE, RESPIRATORY (NON-EXPECTORATED)     Status: None   Collection Time    10/14/13  2:59 PM      Result Value Ref Range Status   Specimen Description SPUTUM   Final   Special Requests NONE   Final   Gram Stain     Final   Value: RARE WBC PRESENT, PREDOMINANTLY MONONUCLEAR     FEW SQUAMOUS EPITHELIAL CELLS PRESENT     MODERATE GRAM POSITIVE COCCI IN PAIRS     IN CLUSTERS FEW GRAM POSITIVE RODS     RARE GRAM NEGATIVE RODS   Culture     Final   Value: NORMAL OROPHARYNGEAL FLORA     Performed at [facility]   Report Status 10/16/2013 FINAL   Final  CULTURE, BLOOD (ROUTINE X 2)     Status: None   Collection Time    10/19/13  4:55 PM      Result Value Ref Range Status   Specimen Description BLOOD LEFT ARM   Final   Special Requests     Final   Value: BOTTLES DRAWN  AEROBIC AND ANAEROBIC 10CC BOTH BOTTLES   Culture  Setup Time     Final   Value: 10/19/2013 22:40     Performed at [facility]   Culture     Final   Value:        BLOOD CULTURE RECEIVED NO GROWTH TO DATE CULTURE WILL BE HELD FOR 5 DAYS BEFORE ISSUING A FINAL NEGATIVE REPORT     Performed at [facility]   Report Status PENDING   Incomplete     Studies: Dg Chest Port 1 View  10/18/2013   CLINICAL DATA:  Chest pain  EXAM: PORTABLE CHEST - 1 VIEW  COMPARISON:  October 14, 2013  FINDINGS: Nasogastric tube is been removed. Central catheter tip is at the cavoatrial junction. No pneumothorax. There is consolidation in the left base with small left effusion. There is atelectatic change in the right base. Heart is mildly enlarged with normal pulmonary vascularity. No adenopathy.  IMPRESSION: Airspace consolidation left lower lobe with small left effusion. Right base atelectasis. Central catheter tip at  cavoatrial junction. No pneumothorax. No change in cardiac silhouette.   Electronically Signed   By: Lowella Grip M.D.   On: 10/18/2013 14:16    Scheduled Meds: . antiseptic oral rinse  15 mL Mouth Rinse q12n4p  . ceFEPime (MAXIPIME) IV  1 g Intravenous 3 times per day  . chlorhexidine  15 mL Mouth Rinse BID  . enoxaparin (LOVENOX) injection  40 mg Subcutaneous Q24H  .  HYDROmorphone (DILAUDID) injection  2 mg Intravenous Q4H  . insulin aspart  0-9 Units Subcutaneous 4 times per day  . methocarbamol (ROBAXIN) IV  1,000 mg Intravenous Q8H  . metoCLOPramide (REGLAN) injection  5 mg Intravenous 3 times per day  . ondansetron (ZOFRAN) IV  4 mg Intravenous 4 times per day  . pantoprazole (PROTONIX) IV  40 mg Intravenous QHS  . sodium chloride  10-40 mL Intracatheter Q12H  . vancomycin  1,000 mg Intravenous Q8H   Continuous Infusions: . sodium chloride 30 mL/hr at 10/17/13 0514  . Marland KitchenTPN (CLINIMIX-E) Adult 80 mL/hr at 10/19/13 1824   And  . fat emulsion 250 mL (10/19/13 1824)      Time spent: 35 minutes    Brando Taves, [facility] Pager [phone] If 7PM-7AM, please contact night-coverage at [email or web address], password [facility] 10/20/2013, 8:40 AM  LOS: 12 days

## 2013-10-20 NOTE — Progress Notes (Addendum)
INFECTIOUS DISEASE PROGRESS NOTE  ID: Andrea Best is a 43 y.o. female with  Principal Problem:   Gastric outlet obstruction Active Problems:   Gastric cancer   Anemia, iron deficiency   Protein-calorie malnutrition, severe   Hypomagnesemia   Hyperphosphatemia   Fever, unspecified   Tachycardia   Leucocytosis   HCAP (healthcare-associated pneumonia)  Subjective: Without complaints.  No abd pain.  Abtx:  Anti-infectives   Start     Dose/Rate Route Frequency Ordered Stop   10/20/13 1000  hydroxychloroquine (PLAQUENIL) tablet 200 mg     200 mg Oral 2 times daily 10/20/13 0906     10/19/13 2200  vancomycin (VANCOCIN) IVPB 1000 mg/200 mL premix     1,000 mg 200 mL/hr over 60 Minutes Intravenous Every 8 hours 10/19/13 1402     10/17/13 2315  vancomycin (VANCOCIN) 1,250 mg in sodium chloride 0.9 % 250 mL IVPB  Status:  Discontinued     1,250 mg 166.7 mL/hr over 90 Minutes Intravenous 3 times per day 10/17/13 2303 10/19/13 1337   10/16/13 0830  vancomycin (VANCOCIN) IVPB 1000 mg/200 mL premix  Status:  Discontinued     1,000 mg 200 mL/hr over 60 Minutes Intravenous 3 times per day 10/16/13 0730 10/17/13 2303   10/14/13 2200  vancomycin (VANCOCIN) IVPB 750 mg/150 ml premix  Status:  Discontinued     750 mg 150 mL/hr over 60 Minutes Intravenous Every 8 hours 10/14/13 1158 10/16/13 0730   10/14/13 1400  ceFEPIme (MAXIPIME) 1 g in dextrose 5 % 50 mL IVPB     1 g 100 mL/hr over 30 Minutes Intravenous 3 times per day 10/14/13 1125 10/22/13 1359   10/14/13 1400  vancomycin (VANCOCIN) 1,500 mg in sodium chloride 0.9 % 500 mL IVPB     1,500 mg 250 mL/hr over 120 Minutes Intravenous  Once 10/14/13 1158 10/14/13 1658   10/12/13 1600  levofloxacin (LEVAQUIN) IVPB 500 mg  Status:  Discontinued     500 mg 100 mL/hr over 60 Minutes Intravenous Every 24 hours 10/12/13 1545 10/14/13 1124   10/10/13 1230  ceFAZolin (ANCEF) IVPB 2 g/50 mL premix     2 g 100 mL/hr over 30 Minutes  Intravenous  Once 10/10/13 1220 10/10/13 1222   10/10/13 0600  [MAR Hold]  ceFAZolin (ANCEF) IVPB 2 g/50 mL premix     (On MAR Hold since 10/10/13 0659)   2 g 100 mL/hr over 30 Minutes Intravenous On call to O.R. 10/09/13 1244 10/10/13 0743      Medications:  Scheduled: . antiseptic oral rinse  15 mL Mouth Rinse q12n4p  . ceFEPime (MAXIPIME) IV  1 g Intravenous 3 times per day  . chlorhexidine  15 mL Mouth Rinse BID  . enoxaparin (LOVENOX) injection  40 mg Subcutaneous Q24H  .  HYDROmorphone (DILAUDID) injection  2 mg Intravenous Q4H  . hydroxychloroquine  200 mg Oral BID  . insulin aspart  0-9 Units Subcutaneous 4 times per day  . methocarbamol (ROBAXIN) IV  1,000 mg Intravenous Q8H  . metoCLOPramide (REGLAN) injection  5 mg Intravenous 3 times per day  . ondansetron (ZOFRAN) IV  4 mg Intravenous 4 times per day  . pantoprazole (PROTONIX) IV  40 mg Intravenous QHS  . predniSONE  5 mg Oral BID WC  . sodium chloride  10-40 mL Intracatheter Q12H  . vancomycin  1,000 mg Intravenous Q8H    Objective: Vital signs in last 24 hours: Temp:  [98.4 F (36.9 C)-99.8 F (  37.7 C)] 99.4 F (37.4 C) (07/27 1352) Pulse Rate:  [112-129] 129 (07/27 1352) Resp:  [18-20] 18 (07/27 1352) BP: (109-121)/(56-65) 118/57 mmHg (07/27 1352) SpO2:  [96 %-100 %] 100 % (07/27 1352)   General appearance: alert, cooperative and no distress Resp: clear to auscultation bilaterally Cardio: regular rate and rhythm GI: normal findings: bowel sounds normal and soft, non-tender Extremities: edema none  Lab Results  Recent Labs  10/19/13 0405 10/20/13 0500  WBC 18.6* 22.1*  HGB 7.2* 7.3*  HCT 23.2* 23.7*  NA 134* 134*  K 3.8 3.8  CL 100 100  CO2 22 22  BUN 9 9  CREATININE 0.48* 0.47*   Liver Panel  Recent Labs  10/20/13 0500  PROT 7.7  ALBUMIN 1.8*  AST 142*  ALT 131*  ALKPHOS 481*  BILITOT <0.2*   Sedimentation Rate No results found for this basename: ESRSEDRATE,  in the last 72  hours C-Reactive Protein No results found for this basename: CRP,  in the last 72 hours  Microbiology: Recent Results (from the past 240 hour(s))  MRSA PCR SCREENING     Status: None   Collection Time    10/10/13  3:39 PM      Result Value Ref Range Status   MRSA by PCR NEGATIVE  NEGATIVE Final   Comment:            The GeneXpert MRSA Assay (FDA     approved for NASAL specimens     only), is one component of a     comprehensive MRSA colonization     surveillance program. It is not     intended to diagnose MRSA     infection nor to guide or     monitor treatment for     MRSA infections.  CULTURE, BLOOD (ROUTINE X 2)     Status: None   Collection Time    10/14/13 11:50 AM      Result Value Ref Range Status   Specimen Description BLOOD LEFT ARM   Final   Special Requests BOTTLES DRAWN AEROBIC AND ANAEROBIC [facility] EACH   Final   Culture  Setup Time     Final   Value: 10/14/2013 14:17     Performed at [facility]   Culture     Final   Value: NO GROWTH 5 DAYS     Performed at [facility]   Report Status 10/20/2013 FINAL   Final  CULTURE, BLOOD (ROUTINE X 2)     Status: None   Collection Time    10/14/13 11:53 AM      Result Value Ref Range Status   Specimen Description BLOOD LEFT HAND   Final   Special Requests BOTTLES DRAWN AEROBIC AND ANAEROBIC [facility] EACH   Final   Culture  Setup Time     Final   Value: 10/14/2013 14:17     Performed at [facility]   Culture     Final   Value: NO GROWTH 5 DAYS     Performed at [facility]   Report Status 10/20/2013 FINAL   Final  URINE CULTURE     Status: None   Collection Time    10/14/13 12:09 PM      Result Value Ref Range Status   Specimen Description URINE, CATHETERIZED   Final   Special Requests NONE   Final   Culture  Setup Time     Final   Value: 10/14/2013 17:48  Performed at [place]     Final   Value: NO GROWTH     Performed at [facility]   Culture      Final   Value: NO GROWTH     Performed at [facility]   Report Status 10/15/2013 FINAL   Final  CULTURE, EXPECTORATED SPUTUM-ASSESSMENT     Status: None   Collection Time    10/14/13  2:59 PM      Result Value Ref Range Status   Specimen Description SPUTUM   Final   Special Requests NONE   Final   Sputum evaluation     Final   Value: THIS SPECIMEN IS ACCEPTABLE. RESPIRATORY CULTURE REPORT TO FOLLOW.   Report Status 10/14/2013 FINAL   Final  CULTURE, RESPIRATORY (NON-EXPECTORATED)     Status: None   Collection Time    10/14/13  2:59 PM      Result Value Ref Range Status   Specimen Description SPUTUM   Final   Special Requests NONE   Final   Gram Stain     Final   Value: RARE WBC PRESENT, PREDOMINANTLY MONONUCLEAR     FEW SQUAMOUS EPITHELIAL CELLS PRESENT     MODERATE GRAM POSITIVE COCCI IN PAIRS     IN CLUSTERS FEW GRAM POSITIVE RODS     RARE GRAM NEGATIVE RODS   Culture     Final   Value: NORMAL OROPHARYNGEAL FLORA     Performed at [facility]   Report Status 10/16/2013 FINAL   Final  CULTURE, BLOOD (ROUTINE X 2)     Status: None   Collection Time    10/19/13  4:55 PM      Result Value Ref Range Status   Specimen Description BLOOD LEFT ARM   Final   Special Requests     Final   Value: BOTTLES DRAWN AEROBIC AND ANAEROBIC 10CC BOTH BOTTLES   Culture  Setup Time     Final   Value: 10/19/2013 22:40     Performed at [facility]   Culture     Final   Value:        BLOOD CULTURE RECEIVED NO GROWTH TO DATE CULTURE WILL BE HELD FOR 5 DAYS BEFORE ISSUING A FINAL NEGATIVE REPORT     Performed at [facility]   Report Status PENDING   Incomplete    Studies/Results: Ct Angio Chest Pe W/cm &/or Wo Cm  10/20/2013   CLINICAL DATA:  History of malignant gastric tumor removal now with mid abdominal pain and tachycardia ; history of lupus and Sjogren's disease  EXAM: CT ANGIOGRAPHY CHEST  CT ABDOMEN AND PELVIS WITH CONTRAST  TECHNIQUE: Multidetector  CT imaging of the chest was performed using the standard protocol during bolus administration of intravenous contrast. Multiplanar CT image reconstructions and MIPs were obtained to evaluate the vascular anatomy. Multidetector CT imaging of the abdomen and pelvis was performed using the standard protocol during bolus administration of intravenous contrast.  CONTRAST:  122mL OMNIPAQUE IOHEXOL 350 MG/ML SOLN, 46mL OMNIPAQUE IOHEXOL 300 MG/ML SOLN  COMPARISON:  Portable chest x-ray of today's date CT scan of the chest and abdomen and pelvis of October 03, 2013  FINDINGS: CTA CHEST FINDINGS  Contrast within the pulmonary arterial tree is normal. The caliber of the thoracic aorta is normal. There is no false lumen. There is a new moderate size left pleural effusion layering posteriorly. There is atelectasis of much of the  left lower lobe and atelectasis of the posterior inferior aspect of the right lower lobe. The cardiac chambers are top-normal in size. There is no pericardial effusion. There is a precarinal lymph node which measures 9 mm in short axis. There is no sub carinal, AP window, or axillary lymphadenopathy. There is a small hiatal hernia.  The observed portions of the bony thorax are normal.  CT ABDOMEN and PELVIS FINDINGS  The patient has undergone partial resection of the left hepatic lobe. There is a small amount of fluid at the surgical site. There is a drainage catheter in place which enters the abdominal cavity tip to the right of midline. The tip comes to lie superior to the spleen adjacent to the medial aspect of the remaining portion of the stomach. There are a few normal sized perigastric lymph nodes. There is no bulky lymphadenopathy.  There is minimal intrahepatic ductal dilation. The gallbladder is normal. The pancreas exhibits no inflammatory change or focal mass. There is a thin walled, elliptical fluid collection inferior to the tip of the spleen which measures 8.3 cm AP x 2.5 cm transversely x  4.2 cm longitudinally with HU value of +14. The kidneys and adrenal glands are normal. The periaortic and pericaval regions are normal.  Contrast has traversed the stomach and reached as far distally as the proximal transverse colon. There is no ileus, obstruction, or acute inflammation. There is small amount of free pelvic fluid. The uterus exhibits heterogeneous density and there is likely a fibroid in the fundus measuring 3.2 cm in diameter. There is a tiny amount of gas within the partially distended urinary bladder. There is no inguinal hernia. There is a small amount of fluid within the midline incision.  The lumbar spine and bony pelvis are unremarkable.  Review of the MIP images confirms the above findings.  IMPRESSION: 1. There is no acute pulmonary embolism nor acute thoracic aortic pathology. 2. Left lower lobe atelectasis and/or pneumonia with moderate-sized left pleural effusion new since July 10th. There mild posterior right lower lobe atelectasis. 3. There are surgical changes from the previous partial left gastrectomy. A drainage catheter is in place in the gastric band and no abnormal fluid collection is seen here. There is a fluid collection inferior to the spleen compatible with a seroma or resolved hematoma. There is a small amount of free pelvic fluid. 4. Postsurgical changes in the left hepatic lobe are noted. A small amount of fluid is present at the surgical site but no abscess is demonstrated. 5. There is no acute bowel abnormality nor acute hepatobiliary abnormality. There is a small amount of fluid in the midline incision.   Electronically Signed   By: David  Martinique   On: 10/20/2013 13:15   Ct Abdomen Pelvis W Contrast  10/20/2013   CLINICAL DATA:  Status post subtotal gastrectomy.  EXAM: CT CHEST, ABDOMEN, AND PELVIS WITH CONTRAST  TECHNIQUE: Multidetector CT imaging of the chest, abdomen and pelvis was performed following the standard protocol during bolus administration of  intravenous contrast.  CONTRAST:  164mL OMNIPAQUE IOHEXOL 350 MG/ML SOLN, 34mL OMNIPAQUE IOHEXOL 300 MG/ML SOLN  COMPARISON:  CT scan of October 03, 2013.  FINDINGS: CT CHEST FINDINGS  No pneumothorax is noted. Large left pleural effusion is noted with atelectasis of the left lower lobe. Mild right posterior basilar subsegmental atelectasis is noted. Right-sided PICC line is noted with distal tip at the cavoatrial junction. There is no evidence of pulmonary embolus. Thoracic aorta and great vessels appear  normal without evidence of dissection or aneurysm. Bilateral axillary adenopathy is noted which is unchanged compared to prior exam, with the largest measuring 21 x 11 mm on the right side. Precarinal lymph node measuring 12 x 9 mm is noted which most likely is benign.  CT ABDOMEN AND PELVIS FINDINGS  No gallstones are noted. Patient is status post partial gastrectomy with Roux en Y procedure. The spleen and pancreas appear normal. The low density seen in the left hepatic lobe on prior exam is significantly improved compared to prior exam and is most consistent with focal fatty infiltration as opposed to neoplasm. No other abnormality seen in hepatic parenchyma. Adrenal glands and kidneys appear normal. No hydronephrosis or renal obstruction is in noted. There is noted a lenticular shaped fluid collection measuring 8.5 x 2.8 cm inferior to the spleen. Surgical drain remains within the epigastric region. Expected amount of postoperative fluid is seen in the epigastric region anteriorly. Minimal amount of fluid is noted in the right pericolic gutter. There is no evidence of bowel obstruction. Urinary bladder appears normal. Continued presence of probable fibroid is noted and uterine fundus. Interval development of fluid collection with enhancing margins is noted in the presacral space of the pelvis measuring 3.9 x 3.1 cm. Anterior midline surgical incision is noted with a small amount of associated fluid.  IMPRESSION:  No evidence of pulmonary embolus.  Large left pleural effusion is noted with associated atelectasis of the left lower lobe.  Stable bilateral axillary and inguinal adenopathy is noted which which may be reactive, but metastatic disease cannot be excluded. PET scan may be performed further evaluation.  Status post partial gastrectomy with expected postoperative changes. Surgical drain remains in the epigastric region.  Abnormality noted in left hepatic lobe on prior exam is significantly improved to most likely represents focal fatty infiltration.  Stable uterine fibroid.  There is noted 8.5 x 2.8 cm lenticular shaped fluid collection inferior to the spleen concerning for possible abscess or seroma. Also noted is 3.9 x 3.1 cm fluid collection seen in the presacral space of the posterior pelvis which may represent abscess is well. Critical Value/emergent results were called by telephone at the time of interpretation on 10/20/2013 at 1:37 pm to [facility] , who verbally acknowledged these results.   Electronically Signed   By: Sabino Dick M.D.   On: 10/20/2013 13:39     Assessment/Plan: Leukocytosis  Pneumonia ? abd abscesses Severe protein calorie malnutrition   Total days of antibiotics: 9 Total days of antibiotics: 8  levaquin 7-19 -->7-21  Cefepime 7-21 -->  Vancomycin 7-21 -->   Ct results noted.  For IR drain tomorrow Will change cefpime to imipenem WBC up  HIV (-) LE dopplers (-) on parenteral nutrition        Bobby Rumpf Infectious Diseases (pager) [phone] [email or web address] 10/20/2013, 3:26 PM  LOS: 12 days   **Disclaimer: This note may have been dictated with voice recognition software. Similar sounding words can inadvertently be transcribed and this note may contain transcription errors which may not have been corrected upon publication of note.**

## 2013-10-20 NOTE — Progress Notes (Signed)
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 Azithromycin Rash  . Sulfa Antibiotics Rash  . Hydrocodone-Acetaminophen Nausea And Vomiting    Hallucinating   . 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 SHE DOES NOT WANT ANY BLOOD PRODUCTS OR FRACTIONS   Patient Measurements: Height: 5' 6"  (167.6 cm) Weight: 157 lb 3.2 oz (71.305 kg) IBW/kg (Calculated) : 59.3  Vital Signs: Temp: 98.4 F (36.9 C) (07/27 0636) Temp src: Oral (07/27 0636) BP: 111/59 mmHg (07/27 0636) Pulse Rate: 112 (07/27 0636) Intake/Output from previous day: 07/26 0701 - 07/27 0700 In: 2926 [P.O.:240; I.V.:660; IV Piggyback:580; OMB:5597] Out: 0  Intake/Output from this shift:    Labs:  Recent Labs  10/18/13 0455 10/19/13 0405 10/20/13 0500  WBC 17.8* 18.6* 22.1*  HGB 7.2* 7.2* 7.3*  HCT 23.6* 23.2* 23.7*  PLT 346 424* 514*    Recent Labs  10/18/13 0455 10/19/13 0405 10/20/13 0500  NA 134* 134* 134*  K 3.7 3.8 3.8  CL 100 100 100  CO2 24 22 22   GLUCOSE 116* 107* 108*  BUN 9 9 9   CREATININE 0.44* 0.48* 0.47*  CALCIUM 8.8 8.7 8.7  MG  --   --  1.9  PHOS  --   --  4.5  PROT  --   --  7.7  ALBUMIN  --   --  1.8*  AST  --   --  142*  ALT  --   --  131*  ALKPHOS  --   --  481*  BILITOT  --   --  <0.2*  TRIG  --   --  130   Estimated Creatinine Clearance: 94.6 ml/min (by C-G formula based on Cr of 0.47).    Recent Labs  10/19/13 1808 10/19/13 2013 10/20/13 0617  GLUCAP 124* 119* 129*   Medications:  Scheduled:  . antiseptic oral rinse  15 mL Mouth Rinse q12n4p  . ceFEPime (MAXIPIME) IV  1 g Intravenous 3 times per day  . chlorhexidine  15 mL Mouth Rinse BID  . enoxaparin (LOVENOX) injection  40 mg Subcutaneous Q24H  . insulin aspart  0-9 Units Subcutaneous 4 times per day  . methocarbamol (ROBAXIN) IV  1,000 mg Intravenous Q8H  . metoCLOPramide (REGLAN) injection  5 mg Intravenous 3  times per day  . ondansetron (ZOFRAN) IV  4 mg Intravenous 4 times per day  . pantoprazole (PROTONIX) IV  40 mg Intravenous QHS  . sodium chloride  10-40 mL Intracatheter Q12H  . vancomycin  1,000 mg Intravenous Q8H   Infusions:  . sodium chloride 30 mL/hr at 10/17/13 0514  . Marland KitchenTPN (CLINIMIX-E) Adult 80 mL/hr at 10/19/13 1824   And  . fat emulsion 250 mL (10/19/13 1824)     Nutritional Goals:   RD recs (7/23): Kcal: 2000-2200, Protein: 85-100 gram, Fluid: >/=2100 ml/daily  Per Dietitian recommendations and pt noting feelings of hunger/thirst, agree to increase kCal and adjust TPN accordingly (7/24)  Clinimix 5/20 at a goal rate of 80 ml/hr + lipids to provide: 96 g/day protein, 2169 Kcal/day.  Current nutrition:   Diet: NPO  IVF: NS @ 30 ml/hr  Clinimix + Lipids at goal rate for total of 90 ml/hr  CBGs & Insulin requirements past 24 hours:   CBGs: 108-129  Novolog sensitive scale q4hr - 2 units  Assessment:  24 YOF presents with N/V, weight loss  d/t difficulty eating with recent dx of Gastric cancer. EGD on 09/22/2013 reveald findings of a partially obstructing oozing cratered gastric ulcer in the gastric antrum. 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 Pharmacy consulted to start TPN 7/16 for GOO. Surgery consulted, s/p subtotal gastrectomy with gastric jejunostomy on 7/17.  UGI series 7/22 revealed no evidence of anastomotic leak; slow transit of contrast from the stomach to the jejunostomy loop.  Pt tolerated 24 hr clamping of NG tube with planned removal on 7/26, remains NPO.   Renal / Hepatic function: SCr remains low/stable, LFTs have increased AST 142, ALT 131. Alk Phos 481. Total bili < 0.2. Continue to monitor.  Electrolytes: Na low but stable (unable to adjust in premix TNA), Corr Ca 10.62 (sl elevated), other WNL.    Pre-Albumin: pending today, 3.1 (7/20), 7.9 (7/17) - reflects depleted protein stores  TG:   130 today, 146  (7/20), 94 (7/17), - increased, will continue to monitor  Glucose: at goal  <150 mg/dl, no hypoglycemia  TPN Access: PICC (placed 7/16)  TPN day#:  12  Plan:  At 1800 today:  Continue Clinimix E 5/20 at 80 ml/hr.  20% fat emulsion at 10 ml/hr.  TNA to contain standard multivitamins and trace elements daily.  Continue SSI q6h  TNA lab panels on Mondays & Thursdays.  F/u daily.  F/u prealbumin level   [name], PharmD Pager: [phone] 10/20/2013 8:40 AM

## 2013-10-20 NOTE — Progress Notes (Signed)
*  PRELIMINARY RESULTS* Vascular Ultrasound Lower extremity venous duplex has been completed.  Preliminary findings: no evidence of DVT or baker's cyst.  Landry Mellow, RDMS, RVT  10/20/2013, 10:00 AM

## 2013-10-20 NOTE — Progress Notes (Signed)
Patient ID: Andrea Best, female   DOB: [birth year], 43 y.o.   MRN: [id number] Request received from [facility] for CT guided aspiration/possible drainage of fluid collections inferior to spleen and presacral space. Pt is POD #10 from subtotal gastrectomy/Roux-en-Y gastrojejunostomy/porta hepatis-peripancreatic LN dissection for gastric adenocarcinoma(signet ring type)/GOO. She has increasing WBC, abd pain and purulent drainage from RLQ surgical drain. Imaging studies were reviewed by Dr. Vernard Gambles. Additional PMH as below. Exam: pt awake/alert; chest- dim BS left base; heart- tachy but regular; abd- soft, mildly dist., intact RLQ surgical drain with purulent beige colored fluid in bulb, tender epig- LUQ region; clean midline wound with staples; ext- FROM, no edema.    Filed Vitals:   10/19/13 1700 10/19/13 2226 10/20/13 0636 10/20/13 1352  BP: 109/56 121/65 111/59 118/57  Pulse: 116 120 112 129  Temp: 98.4 F (36.9 C) 99.8 F (37.7 C) 98.4 F (36.9 C) 99.4 F (37.4 C)  TempSrc: Axillary Oral Oral Oral  Resp:  20 18 18   Height:      Weight:      SpO2: 99% 96% 100% 100%   Past Medical History  Diagnosis Date  . Asthma   . Eczema   . Lupus   . Sjogren's disease   . Gastric cancer    Past Surgical History  Procedure Laterality Date  . Cesarean section      1997  . Laparotomy N/A 10/10/2013    Procedure: SUBTOTAL GASTRECTOMY WITH GASTRIC JEJUNOSOTMY;  Surgeon: Edward Jolly, MD;  Location: [facility];  Service: General;  Laterality: N/A;   Dg Chest 2 View  10/14/2013   CLINICAL DATA:  Chest pain. Worsening pneumonia. Fever. Shortness of breath.  EXAM: CHEST  2 VIEW  COMPARISON:  Chest x-ray 10/12/2013.  FINDINGS: There is a right upper extremity PICC with tip terminating in the superior aspect of the right atrium. Nasogastric tube in place extending into the mid stomach. Lung volumes are low. Worsening opacification at the base of the left hemithorax, compatible with atelectasis and/or  consolidation in the left lower lobe, was superimposed moderate left pleural effusion. Right lung appears clear. No evidence of pulmonary edema. Heart size is within normal limits. Upper mediastinal contours are unremarkable. Numerous surgical clips and skin staples are seen overlying the epigastric region.  IMPRESSION: 1. Worsening atelectasis and/or consolidation in the left lower lobe with increasing moderate left pleural effusion. 2. Support apparatus, as above.   Electronically Signed   By: Vinnie Langton M.D.   On: 10/14/2013 14:14   Ct Chest W Contrast  10/03/2013   CLINICAL DATA:  Gastric cancer.  EXAM: CT CHEST, ABDOMEN, AND PELVIS WITH CONTRAST  TECHNIQUE: Multidetector CT imaging of the chest, abdomen and pelvis was performed following the standard protocol during bolus administration of intravenous contrast.  CONTRAST:  173mL OMNIPAQUE IOHEXOL 300 MG/ML  SOLN  COMPARISON:  None.  FINDINGS: CT CHEST FINDINGS  No pneumothorax or pleural effusion is noted. Ground-glass opacity measuring 7 x 5 mm is noted posteriorly in the superior segment of the right lower lobe best seen on image number 26 of series 4. 5 mm nodular density is noted in the right upper lobe best seen on image number 22 of series 4. No definite abnormality seen in the left lung. Bilateral axillary adenopathy is noted with the largest lymph node measuring 13 x 11 mm in the right axillary region. No mediastinal mass or adenopathy is noted. No definite osseous abnormality is noted in the chest.  CT ABDOMEN  AND PELVIS FINDINGS  The spleen and pancreas appear normal. No gallstones are noted. Low density area measuring 30 x 21 mm is noted anteriorly in left hepatic lobe with blood vessel running through it most consistent with fatty infiltration. However, another hypodense area measuring 27 x 24 mm is noted lateral to this within the medial segment of the left hepatic lobe. Metastatic disease cannot be excluded given the history of gastric  carcinoma. Adrenal glands and kidneys appear normal. No hydronephrosis or renal obstruction is noted. Severe gastric distention is noted with a large amount of complex material seen in the body and antrum of the stomach. This most likely represents a combination of neoplasm and retained food debris. The appendix appears normal. No abnormality seen involving the large or small bowel. The abdominal aorta appears normal. Urinary bladder appears normal. 47 x 42 mm low density is noted in the left side of the uterine fundus with peripheral enhancement most consistent with uterine fibroid. No significant retroperitoneal adenopathy is noted. Mild bilateral inguinal adenopathy is noted with the largest lymph node measuring 10 x 9 mm in the right side.  IMPRESSION: Distended stomach is noted with large amount of complex material seen in the body and antrum of the stomach most likely representing a combination of neoplasm an retained food debris.  Low density area measuring 30 x 21 mm is noted anteriorly in left hepatic lobe most consistent with fatty infiltration. However, 27 x 24 mm hypodense area is noted lateral to this within the medial segment the left hepatic lobe which potentially may represent metastatic disease; further evaluation with MRI is recommended.  47 x 42 mm low density seen in left side of uterine fundus most consistent with uterine fibroid. Follow-up pelvic ultrasound may be obtained.  Mild bilateral inguinal adenopathy is noted which may simply be reactive or inflammatory in etiology, but given the history of gastric carcinoma, metastatic disease cannot be excluded.  7 x 5 mm ground-glass opacity seen in superior segment of right lower lobe as well as 5 mm nodular density seen in right upper lobe. Also noted is bilateral axillary adenopathy with largest lymph node measuring 13 x 11 mm on the right side ; given the history of gastric carcinoma, metastatic disease cannot be excluded and further evaluation  with PET scan is recommended.   Electronically Signed   By: Sabino Dick M.D.   On: 10/03/2013 10:22   Ct Angio Chest Pe W/cm &/or Wo Cm  10/20/2013   CLINICAL DATA:  History of malignant gastric tumor removal now with mid abdominal pain and tachycardia ; history of lupus and Sjogren's disease  EXAM: CT ANGIOGRAPHY CHEST  CT ABDOMEN AND PELVIS WITH CONTRAST  TECHNIQUE: Multidetector CT imaging of the chest was performed using the standard protocol during bolus administration of intravenous contrast. Multiplanar CT image reconstructions and MIPs were obtained to evaluate the vascular anatomy. Multidetector CT imaging of the abdomen and pelvis was performed using the standard protocol during bolus administration of intravenous contrast.  CONTRAST:  126mL OMNIPAQUE IOHEXOL 350 MG/ML SOLN, 25mL OMNIPAQUE IOHEXOL 300 MG/ML SOLN  COMPARISON:  Portable chest x-ray of today's date CT scan of the chest and abdomen and pelvis of October 03, 2013  FINDINGS: CTA CHEST FINDINGS  Contrast within the pulmonary arterial tree is normal. The caliber of the thoracic aorta is normal. There is no false lumen. There is a new moderate size left pleural effusion layering posteriorly. There is atelectasis of much of the left lower  lobe and atelectasis of the posterior inferior aspect of the right lower lobe. The cardiac chambers are top-normal in size. There is no pericardial effusion. There is a precarinal lymph node which measures 9 mm in short axis. There is no sub carinal, AP window, or axillary lymphadenopathy. There is a small hiatal hernia.  The observed portions of the bony thorax are normal.  CT ABDOMEN and PELVIS FINDINGS  The patient has undergone partial resection of the left hepatic lobe. There is a small amount of fluid at the surgical site. There is a drainage catheter in place which enters the abdominal cavity tip to the right of midline. The tip comes to lie superior to the spleen adjacent to the medial aspect of the  remaining portion of the stomach. There are a few normal sized perigastric lymph nodes. There is no bulky lymphadenopathy.  There is minimal intrahepatic ductal dilation. The gallbladder is normal. The pancreas exhibits no inflammatory change or focal mass. There is a thin walled, elliptical fluid collection inferior to the tip of the spleen which measures 8.3 cm AP x 2.5 cm transversely x 4.2 cm longitudinally with HU value of +14. The kidneys and adrenal glands are normal. The periaortic and pericaval regions are normal.  Contrast has traversed the stomach and reached as far distally as the proximal transverse colon. There is no ileus, obstruction, or acute inflammation. There is small amount of free pelvic fluid. The uterus exhibits heterogeneous density and there is likely a fibroid in the fundus measuring 3.2 cm in diameter. There is a tiny amount of gas within the partially distended urinary bladder. There is no inguinal hernia. There is a small amount of fluid within the midline incision.  The lumbar spine and bony pelvis are unremarkable.  Review of the MIP images confirms the above findings.  IMPRESSION: 1. There is no acute pulmonary embolism nor acute thoracic aortic pathology. 2. Left lower lobe atelectasis and/or pneumonia with moderate-sized left pleural effusion new since July 10th. There mild posterior right lower lobe atelectasis. 3. There are surgical changes from the previous partial left gastrectomy. A drainage catheter is in place in the gastric band and no abnormal fluid collection is seen here. There is a fluid collection inferior to the spleen compatible with a seroma or resolved hematoma. There is a small amount of free pelvic fluid. 4. Postsurgical changes in the left hepatic lobe are noted. A small amount of fluid is present at the surgical site but no abscess is demonstrated. 5. There is no acute bowel abnormality nor acute hepatobiliary abnormality. There is a small amount of fluid in  the midline incision.   Electronically Signed   By: David  Martinique   On: 10/20/2013 13:15   Ct Abdomen Pelvis W Contrast  10/20/2013   CLINICAL DATA:  Status post subtotal gastrectomy.  EXAM: CT CHEST, ABDOMEN, AND PELVIS WITH CONTRAST  TECHNIQUE: Multidetector CT imaging of the chest, abdomen and pelvis was performed following the standard protocol during bolus administration of intravenous contrast.  CONTRAST:  167mL OMNIPAQUE IOHEXOL 350 MG/ML SOLN, 34mL OMNIPAQUE IOHEXOL 300 MG/ML SOLN  COMPARISON:  CT scan of October 03, 2013.  FINDINGS: CT CHEST FINDINGS  No pneumothorax is noted. Large left pleural effusion is noted with atelectasis of the left lower lobe. Mild right posterior basilar subsegmental atelectasis is noted. Right-sided PICC line is noted with distal tip at the cavoatrial junction. There is no evidence of pulmonary embolus. Thoracic aorta and great vessels appear normal without  evidence of dissection or aneurysm. Bilateral axillary adenopathy is noted which is unchanged compared to prior exam, with the largest measuring 21 x 11 mm on the right side. Precarinal lymph node measuring 12 x 9 mm is noted which most likely is benign.  CT ABDOMEN AND PELVIS FINDINGS  No gallstones are noted. Patient is status post partial gastrectomy with Roux en Y procedure. The spleen and pancreas appear normal. The low density seen in the left hepatic lobe on prior exam is significantly improved compared to prior exam and is most consistent with focal fatty infiltration as opposed to neoplasm. No other abnormality seen in hepatic parenchyma. Adrenal glands and kidneys appear normal. No hydronephrosis or renal obstruction is in noted. There is noted a lenticular shaped fluid collection measuring 8.5 x 2.8 cm inferior to the spleen. Surgical drain remains within the epigastric region. Expected amount of postoperative fluid is seen in the epigastric region anteriorly. Minimal amount of fluid is noted in the right pericolic  gutter. There is no evidence of bowel obstruction. Urinary bladder appears normal. Continued presence of probable fibroid is noted and uterine fundus. Interval development of fluid collection with enhancing margins is noted in the presacral space of the pelvis measuring 3.9 x 3.1 cm. Anterior midline surgical incision is noted with a small amount of associated fluid.  IMPRESSION: No evidence of pulmonary embolus.  Large left pleural effusion is noted with associated atelectasis of the left lower lobe.  Stable bilateral axillary and inguinal adenopathy is noted which which may be reactive, but metastatic disease cannot be excluded. PET scan may be performed further evaluation.  Status post partial gastrectomy with expected postoperative changes. Surgical drain remains in the epigastric region.  Abnormality noted in left hepatic lobe on prior exam is significantly improved to most likely represents focal fatty infiltration.  Stable uterine fibroid.  There is noted 8.5 x 2.8 cm lenticular shaped fluid collection inferior to the spleen concerning for possible abscess or seroma. Also noted is 3.9 x 3.1 cm fluid collection seen in the presacral space of the posterior pelvis which may represent abscess is well. Critical Value/emergent results were called by telephone at the time of interpretation on 10/20/2013 at 1:37 pm to [facility] , who verbally acknowledged these results.   Electronically Signed   By: Sabino Dick M.D.   On: 10/20/2013 13:39   Ct Abdomen Pelvis W Contrast  10/03/2013   CLINICAL DATA:  Gastric cancer.  EXAM: CT CHEST, ABDOMEN, AND PELVIS WITH CONTRAST  TECHNIQUE: Multidetector CT imaging of the chest, abdomen and pelvis was performed following the standard protocol during bolus administration of intravenous contrast.  CONTRAST:  170mL OMNIPAQUE IOHEXOL 300 MG/ML  SOLN  COMPARISON:  None.  FINDINGS: CT CHEST FINDINGS  No pneumothorax or pleural effusion is noted. Ground-glass opacity measuring 7 x 5  mm is noted posteriorly in the superior segment of the right lower lobe best seen on image number 26 of series 4. 5 mm nodular density is noted in the right upper lobe best seen on image number 22 of series 4. No definite abnormality seen in the left lung. Bilateral axillary adenopathy is noted with the largest lymph node measuring 13 x 11 mm in the right axillary region. No mediastinal mass or adenopathy is noted. No definite osseous abnormality is noted in the chest.  CT ABDOMEN AND PELVIS FINDINGS  The spleen and pancreas appear normal. No gallstones are noted. Low density area measuring 30 x 21 mm is noted  anteriorly in left hepatic lobe with blood vessel running through it most consistent with fatty infiltration. However, another hypodense area measuring 27 x 24 mm is noted lateral to this within the medial segment of the left hepatic lobe. Metastatic disease cannot be excluded given the history of gastric carcinoma. Adrenal glands and kidneys appear normal. No hydronephrosis or renal obstruction is noted. Severe gastric distention is noted with a large amount of complex material seen in the body and antrum of the stomach. This most likely represents a combination of neoplasm and retained food debris. The appendix appears normal. No abnormality seen involving the large or small bowel. The abdominal aorta appears normal. Urinary bladder appears normal. 47 x 42 mm low density is noted in the left side of the uterine fundus with peripheral enhancement most consistent with uterine fibroid. No significant retroperitoneal adenopathy is noted. Mild bilateral inguinal adenopathy is noted with the largest lymph node measuring 10 x 9 mm in the right side.  IMPRESSION: Distended stomach is noted with large amount of complex material seen in the body and antrum of the stomach most likely representing a combination of neoplasm an retained food debris.  Low density area measuring 30 x 21 mm is noted anteriorly in left  hepatic lobe most consistent with fatty infiltration. However, 27 x 24 mm hypodense area is noted lateral to this within the medial segment the left hepatic lobe which potentially may represent metastatic disease; further evaluation with MRI is recommended.  47 x 42 mm low density seen in left side of uterine fundus most consistent with uterine fibroid. Follow-up pelvic ultrasound may be obtained.  Mild bilateral inguinal adenopathy is noted which may simply be reactive or inflammatory in etiology, but given the history of gastric carcinoma, metastatic disease cannot be excluded.  7 x 5 mm ground-glass opacity seen in superior segment of right lower lobe as well as 5 mm nodular density seen in right upper lobe. Also noted is bilateral axillary adenopathy with largest lymph node measuring 13 x 11 mm on the right side ; given the history of gastric carcinoma, metastatic disease cannot be excluded and further evaluation with PET scan is recommended.   Electronically Signed   By: Sabino Dick M.D.   On: 10/03/2013 10:22   Dg Chest Port 1 View  10/18/2013   CLINICAL DATA:  Chest pain  EXAM: PORTABLE CHEST - 1 VIEW  COMPARISON:  October 14, 2013  FINDINGS: Nasogastric tube is been removed. Central catheter tip is at the cavoatrial junction. No pneumothorax. There is consolidation in the left base with small left effusion. There is atelectatic change in the right base. Heart is mildly enlarged with normal pulmonary vascularity. No adenopathy.  IMPRESSION: Airspace consolidation left lower lobe with small left effusion. Right base atelectasis. Central catheter tip at cavoatrial junction. No pneumothorax. No change in cardiac silhouette.   Electronically Signed   By: Lowella Grip M.D.   On: 10/18/2013 14:16   Dg Chest Port 1 View  10/12/2013   CLINICAL DATA:  Fever and cough.  Asthma.  EXAM: PORTABLE CHEST - 1 VIEW  COMPARISON:  10/08/2013  FINDINGS: Nasogastric tube is seen with tip in the mid stomach. A right arm  PICC line is seen with tip overlying the proximal to mid right atrium. Low lung volumes are seen with bibasilar opacity which may be due to atelectasis or pneumonia. No evidence of pleural effusion.  IMPRESSION: Low lung volumes with bibasilar atelectasis or pneumonia.   Electronically  Signed   By: Earle Gell M.D.   On: 10/12/2013 09:30   Dg Chest Port 1 View  10/08/2013   CLINICAL DATA:  Dry cough.  Evaluate for infiltrate  EXAM: PORTABLE CHEST - 1 VIEW  COMPARISON:  06/30/2007  FINDINGS: Normal heart size and mediastinal contours. No consolidation or edema. Nodular densities seen on preceding chest CT are not clearly visible. No effusion or pneumothorax. No acute osseous findings.  IMPRESSION: No active disease.   Electronically Signed   By: Jorje Guild M.D.   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 Postop day 5. Abdomen pain, leukocytosis, query anastomotic leak. Initial encounter.  EXAM: WATER SOLUBLE UPPER GI SERIES  TECHNIQUE: Single-column upper GI series was performed using water soluble contrast.  CONTRAST:  67mL OMNIPAQUE IOHEXOL 300 MG/ML  SOLN  COMPARISON:  Preoperative CT Abdomen and Pelvis 10/03/2013.  FLUOROSCOPY TIME:  1 min and 16 seconds.  FINDINGS: Preprocedural scout view of the abdomen. NG tube in place in the left upper quadrant. Right abdominal approach postoperative drain. Numerous surgical clips to the right of the spine. Multiple staple lines in the left upper quadrant. Midline abdominal staples. Paucity of bowel gas. Non obstructed bowel gas pattern.  50 mL of Omnipaque 300 was injected into the patient NG tube and followed by 50 mL of water. Filling of the proximal stomach remnant occurred as expected. A small volume of gastroesophageal reflux occurred tracking proximally along the course of the tube. The patient was mildly symptomatic to this.  After  a short delay of to min there is faint contrast visible in the jejunostomy loop (series 9-11 arrows). After an additional 15 min delay, more robust opacification of the jejunostomy loop is noted (image 16). No abnormal accumulation of contrast identified. No contrast within the nearby surgical drain.  IMPRESSION: 1. No evidence of anastomotic leak. Slow transit of contrast from the stomach to the jejunostomy loop. 2. Small volume gastroesophageal reflux to which the patient was mildly symptomatic.   Electronically Signed   By: Lars Pinks M.D.   On: 10/15/2013 12:34  Results for orders placed during the hospital encounter of 10/08/13  MRSA PCR SCREENING      Result Value Ref Range   MRSA by PCR NEGATIVE  NEGATIVE  URINE CULTURE      Result Value Ref Range   Specimen Description URINE, CATHETERIZED     Special Requests NONE     Culture  Setup Time       Value: 10/14/2013 17:48     Performed at [facility] Count       Value: NO GROWTH     Performed at [facility]   Culture       Value: NO GROWTH     Performed at [facility]   Report Status 10/15/2013 FINAL    CULTURE, BLOOD (ROUTINE X 2)      Result Value Ref Range   Specimen Description BLOOD LEFT HAND     Special Requests BOTTLES DRAWN AEROBIC AND ANAEROBIC 5CC EACH     Culture  Setup Time       Value: 10/14/2013 14:17     Performed at [facility]   Culture       Value: NO GROWTH 5 DAYS     Performed at [facility]  Report Status 10/20/2013 FINAL    CULTURE, BLOOD (ROUTINE X 2)      Result Value Ref Range   Specimen Description BLOOD LEFT ARM     Special Requests BOTTLES DRAWN AEROBIC AND ANAEROBIC 5CC EACH     Culture  Setup Time       Value: 10/14/2013 14:17     Performed at [facility]   Culture       Value: NO GROWTH 5 DAYS     Performed at [facility]   Report Status 10/20/2013 FINAL    CULTURE, EXPECTORATED SPUTUM-ASSESSMENT      Result Value Ref  Range   Specimen Description SPUTUM     Special Requests NONE     Sputum evaluation       Value: THIS SPECIMEN IS ACCEPTABLE. RESPIRATORY CULTURE REPORT TO FOLLOW.   Report Status 10/14/2013 FINAL    CULTURE, RESPIRATORY (NON-EXPECTORATED)      Result Value Ref Range   Specimen Description SPUTUM     Special Requests NONE     Gram Stain       Value: RARE WBC PRESENT, PREDOMINANTLY MONONUCLEAR     FEW SQUAMOUS EPITHELIAL CELLS PRESENT     MODERATE GRAM POSITIVE COCCI IN PAIRS     IN CLUSTERS FEW GRAM POSITIVE RODS     RARE GRAM NEGATIVE RODS   Culture       Value: NORMAL OROPHARYNGEAL FLORA     Performed at [facility]   Report Status 10/16/2013 FINAL    CULTURE, BLOOD (ROUTINE X 2)      Result Value Ref Range   Specimen Description BLOOD LEFT ARM     Special Requests       Value: BOTTLES DRAWN AEROBIC AND ANAEROBIC 10CC BOTH BOTTLES   Culture  Setup Time       Value: 10/19/2013 22:40     Performed at [facility]   Culture       Value:        BLOOD CULTURE RECEIVED NO GROWTH TO DATE CULTURE WILL BE HELD FOR 5 DAYS BEFORE ISSUING A FINAL NEGATIVE REPORT     Performed at [facility]   Report Status PENDING    COMPREHENSIVE METABOLIC PANEL      Result Value Ref Range   Sodium 138  137 - 147 mEq/L   Potassium 4.5  3.7 - 5.3 mEq/L   Chloride 102  96 - 112 mEq/L   CO2 26  19 - 32 mEq/L   Glucose, Bld 89  70 - 99 mg/dL   BUN 8  6 - 23 mg/dL   Creatinine, Ser 0.68  0.50 - 1.10 mg/dL   Calcium 9.6  8.4 - 10.5 mg/dL   Total Protein 8.3  6.0 - 8.3 g/dL   Albumin 2.9 (*) 3.5 - 5.2 g/dL   AST 34  0 - 37 U/L   ALT 25  0 - 35 U/L   Alkaline Phosphatase 50  39 - 117 U/L   Total Bilirubin 0.4  0.3 - 1.2 mg/dL   GFR calc non Af Amer >90  >90 mL/min   GFR calc Af Amer >90  >90 mL/min   Anion gap 10  5 - 15  CBC      Result Value Ref Range   WBC 4.5  4.0 - 10.5 K/uL   RBC 4.56  3.87 - 5.11 MIL/uL   Hemoglobin 9.9 (*) 12.0 - 15.0 g/dL   HCT 32.9 (*)  36.0 -  46.0 %   MCV 72.1 (*) 78.0 - 100.0 fL   MCH 21.7 (*) 26.0 - 34.0 pg   MCHC 30.1  30.0 - 36.0 g/dL   RDW 17.8 (*) 11.5 - 15.5 %   Platelets 386  150 - 400 K/uL  COMPREHENSIVE METABOLIC PANEL      Result Value Ref Range   Sodium 139  137 - 147 mEq/L   Potassium 3.6 (*) 3.7 - 5.3 mEq/L   Chloride 105  96 - 112 mEq/L   CO2 24  19 - 32 mEq/L   Glucose, Bld 87  70 - 99 mg/dL   BUN 8  6 - 23 mg/dL   Creatinine, Ser 0.58  0.50 - 1.10 mg/dL   Calcium 8.6  8.4 - 10.5 mg/dL   Total Protein 7.2  6.0 - 8.3 g/dL   Albumin 2.5 (*) 3.5 - 5.2 g/dL   AST 32  0 - 37 U/L   ALT 22  0 - 35 U/L   Alkaline Phosphatase 45  39 - 117 U/L   Total Bilirubin 0.3  0.3 - 1.2 mg/dL   GFR calc non Af Amer >90  >90 mL/min   GFR calc Af Amer >90  >90 mL/min   Anion gap 10  5 - 15  CBC      Result Value Ref Range   WBC 3.8 (*) 4.0 - 10.5 K/uL   RBC 4.22  3.87 - 5.11 MIL/uL   Hemoglobin 9.2 (*) 12.0 - 15.0 g/dL   HCT 30.4 (*) 36.0 - 46.0 %   MCV 72.0 (*) 78.0 - 100.0 fL   MCH 21.8 (*) 26.0 - 34.0 pg   MCHC 30.3  30.0 - 36.0 g/dL   RDW 17.9 (*) 11.5 - 15.5 %   Platelets 331  150 - 400 K/uL  URINALYSIS, ROUTINE W REFLEX MICROSCOPIC      Result Value Ref Range   Color, Urine YELLOW  YELLOW   APPearance CLOUDY (*) CLEAR   Specific Gravity, Urine 1.026  1.005 - 1.030   pH 6.5  5.0 - 8.0   Glucose, UA NEGATIVE  NEGATIVE mg/dL   Hgb urine dipstick NEGATIVE  NEGATIVE   Bilirubin Urine NEGATIVE  NEGATIVE   Ketones, ur NEGATIVE  NEGATIVE mg/dL   Protein, ur NEGATIVE  NEGATIVE mg/dL   Urobilinogen, UA 0.2  0.0 - 1.0 mg/dL   Nitrite NEGATIVE  NEGATIVE   Leukocytes, UA SMALL (*) NEGATIVE  URINE MICROSCOPIC-ADD ON      Result Value Ref Range   Squamous Epithelial / LPF RARE  RARE   WBC, UA 3-6  <3 WBC/hpf   RBC / HPF 3-6  <3 RBC/hpf   Bacteria, UA FEW (*) RARE  MAGNESIUM      Result Value Ref Range   Magnesium 1.9  1.5 - 2.5 mg/dL  PHOSPHORUS      Result Value Ref Range   Phosphorus 4.8 (*) 2.3 - 4.6 mg/dL   FERRITIN      Result Value Ref Range   Ferritin 8 (*) 10 - 291 ng/mL  COMPREHENSIVE METABOLIC PANEL      Result Value Ref Range   Sodium 140  137 - 147 mEq/L   Potassium 3.9  3.7 - 5.3 mEq/L   Chloride 106  96 - 112 mEq/L   CO2 25  19 - 32 mEq/L   Glucose, Bld 90  70 - 99 mg/dL   BUN 4 (*) 6 - 23 mg/dL  Creatinine, Ser 0.55  0.50 - 1.10 mg/dL   Calcium 8.6  8.4 - 10.5 mg/dL   Total Protein 6.8  6.0 - 8.3 g/dL   Albumin 2.3 (*) 3.5 - 5.2 g/dL   AST 27  0 - 37 U/L   ALT 19  0 - 35 U/L   Alkaline Phosphatase 43  39 - 117 U/L   Total Bilirubin 0.2 (*) 0.3 - 1.2 mg/dL   GFR calc non Af Amer >90  >90 mL/min   GFR calc Af Amer >90  >90 mL/min   Anion gap 9  5 - 15  PREALBUMIN      Result Value Ref Range   Prealbumin 7.9 (*) 17.0 - 34.0 mg/dL  MAGNESIUM      Result Value Ref Range   Magnesium 1.8  1.5 - 2.5 mg/dL  TRIGLYCERIDES      Result Value Ref Range   Triglycerides 94  <150 mg/dL  CBC      Result Value Ref Range   WBC 3.7 (*) 4.0 - 10.5 K/uL   RBC 4.11  3.87 - 5.11 MIL/uL   Hemoglobin 9.0 (*) 12.0 - 15.0 g/dL   HCT 30.0 (*) 36.0 - 46.0 %   MCV 73.0 (*) 78.0 - 100.0 fL   MCH 21.9 (*) 26.0 - 34.0 pg   MCHC 30.0  30.0 - 36.0 g/dL   RDW 17.9 (*) 11.5 - 15.5 %   Platelets 338  150 - 400 K/uL  DIFFERENTIAL      Result Value Ref Range   Neutrophils Relative % 41 (*) 43 - 77 %   Lymphocytes Relative 42  12 - 46 %   Monocytes Relative 16 (*) 3 - 12 %   Eosinophils Relative 1  0 - 5 %   Basophils Relative 0  0 - 1 %   Neutro Abs 1.5 (*) 1.7 - 7.7 K/uL   Lymphs Abs 1.6  0.7 - 4.0 K/uL   Monocytes Absolute 0.6  0.1 - 1.0 K/uL   Eosinophils Absolute 0.0  0.0 - 0.7 K/uL   Basophils Absolute 0.0  0.0 - 0.1 K/uL   RBC Morphology ELLIPTOCYTES    PHOSPHORUS      Result Value Ref Range   Phosphorus 5.1 (*) 2.3 - 4.6 mg/dL  GLUCOSE, CAPILLARY      Result Value Ref Range   Glucose-Capillary 98  70 - 99 mg/dL   Comment 1 Notify RN    GLUCOSE, CAPILLARY      Result Value Ref  Range   Glucose-Capillary 101 (*) 70 - 99 mg/dL   Comment 1 Notify RN    GLUCOSE, CAPILLARY      Result Value Ref Range   Glucose-Capillary 107 (*) 70 - 99 mg/dL   Comment 1 Notify RN    GLUCOSE, CAPILLARY      Result Value Ref Range   Glucose-Capillary 195 (*) 70 - 99 mg/dL   Comment 1 Documented in Chart     Comment 2 Notify RN    HEMOGLOBIN AND HEMATOCRIT, BLOOD      Result Value Ref Range   Hemoglobin 9.4 (*) 12.0 - 15.0 g/dL   HCT 31.2 (*) 36.0 - 46.0 %  MAGNESIUM      Result Value Ref Range   Magnesium 1.4 (*) 1.5 - 2.5 mg/dL  COMPREHENSIVE METABOLIC PANEL      Result Value Ref Range   Sodium 133 (*) 137 - 147 mEq/L   Potassium 4.2  3.7 -  5.3 mEq/L   Chloride 99  96 - 112 mEq/L   CO2 25  19 - 32 mEq/L   Glucose, Bld 134 (*) 70 - 99 mg/dL   BUN 8  6 - 23 mg/dL   Creatinine, Ser 0.51  0.50 - 1.10 mg/dL   Calcium 8.2 (*) 8.4 - 10.5 mg/dL   Total Protein 6.4  6.0 - 8.3 g/dL   Albumin 2.0 (*) 3.5 - 5.2 g/dL   AST 33  0 - 37 U/L   ALT 21  0 - 35 U/L   Alkaline Phosphatase 42  39 - 117 U/L   Total Bilirubin 0.2 (*) 0.3 - 1.2 mg/dL   GFR calc non Af Amer >90  >90 mL/min   GFR calc Af Amer >90  >90 mL/min   Anion gap 9  5 - 15  PHOSPHORUS      Result Value Ref Range   Phosphorus 4.8 (*) 2.3 - 4.6 mg/dL  CBC      Result Value Ref Range   WBC 22.4 (*) 4.0 - 10.5 K/uL   RBC 3.96  3.87 - 5.11 MIL/uL   Hemoglobin 9.0 (*) 12.0 - 15.0 g/dL   HCT 29.0 (*) 36.0 - 46.0 %   MCV 73.2 (*) 78.0 - 100.0 fL   MCH 22.7 (*) 26.0 - 34.0 pg   MCHC 31.0  30.0 - 36.0 g/dL   RDW 17.8 (*) 11.5 - 15.5 %   Platelets 264  150 - 400 K/uL  GLUCOSE, CAPILLARY      Result Value Ref Range   Glucose-Capillary 255 (*) 70 - 99 mg/dL  GLUCOSE, CAPILLARY      Result Value Ref Range   Glucose-Capillary 188 (*) 70 - 99 mg/dL  GLUCOSE, CAPILLARY      Result Value Ref Range   Glucose-Capillary 140 (*) 70 - 99 mg/dL   Comment 1 Notify RN    GLUCOSE, CAPILLARY      Result Value Ref Range    Glucose-Capillary 125 (*) 70 - 99 mg/dL  GLUCOSE, CAPILLARY      Result Value Ref Range   Glucose-Capillary 115 (*) 70 - 99 mg/dL   Comment 1 Documented in Chart     Comment 2 Notify RN    GLUCOSE, CAPILLARY      Result Value Ref Range   Glucose-Capillary 105 (*) 70 - 99 mg/dL   Comment 1 Documented in Chart     Comment 2 Notify RN    GLUCOSE, CAPILLARY      Result Value Ref Range   Glucose-Capillary 107 (*) 70 - 99 mg/dL   Comment 1 Documented in Chart     Comment 2 Notify RN    BASIC METABOLIC PANEL      Result Value Ref Range   Sodium 131 (*) 137 - 147 mEq/L   Potassium 4.1  3.7 - 5.3 mEq/L   Chloride 99  96 - 112 mEq/L   CO2 27  19 - 32 mEq/L   Glucose, Bld 136 (*) 70 - 99 mg/dL   BUN 6  6 - 23 mg/dL   Creatinine, Ser 0.51  0.50 - 1.10 mg/dL   Calcium 8.1 (*) 8.4 - 10.5 mg/dL   GFR calc non Af Amer >90  >90 mL/min   GFR calc Af Amer >90  >90 mL/min   Anion gap 5  5 - 15  MAGNESIUM      Result Value Ref Range   Magnesium 1.8  1.5 - 2.5  mg/dL  PHOSPHORUS      Result Value Ref Range   Phosphorus 2.3  2.3 - 4.6 mg/dL  GLUCOSE, CAPILLARY      Result Value Ref Range   Glucose-Capillary 111 (*) 70 - 99 mg/dL  GLUCOSE, CAPILLARY      Result Value Ref Range   Glucose-Capillary 90  70 - 99 mg/dL  CBC      Result Value Ref Range   WBC 18.9 (*) 4.0 - 10.5 K/uL   RBC 3.59 (*) 3.87 - 5.11 MIL/uL   Hemoglobin 8.0 (*) 12.0 - 15.0 g/dL   HCT 26.3 (*) 36.0 - 46.0 %   MCV 73.3 (*) 78.0 - 100.0 fL   MCH 22.3 (*) 26.0 - 34.0 pg   MCHC 30.4  30.0 - 36.0 g/dL   RDW 18.1 (*) 11.5 - 15.5 %   Platelets 194  150 - 400 K/uL  GLUCOSE, CAPILLARY      Result Value Ref Range   Glucose-Capillary 114 (*) 70 - 99 mg/dL  GLUCOSE, CAPILLARY      Result Value Ref Range   Glucose-Capillary 107 (*) 70 - 99 mg/dL   Comment 1 Documented in Chart     Comment 2 Notify RN    GLUCOSE, CAPILLARY      Result Value Ref Range   Glucose-Capillary 106 (*) 70 - 99 mg/dL  COMPREHENSIVE METABOLIC PANEL       Result Value Ref Range   Sodium 134 (*) 137 - 147 mEq/L   Potassium 4.0  3.7 - 5.3 mEq/L   Chloride 100  96 - 112 mEq/L   CO2 26  19 - 32 mEq/L   Glucose, Bld 127 (*) 70 - 99 mg/dL   BUN 6  6 - 23 mg/dL   Creatinine, Ser 0.44 (*) 0.50 - 1.10 mg/dL   Calcium 8.4  8.4 - 10.5 mg/dL   Total Protein 6.2  6.0 - 8.3 g/dL   Albumin 1.6 (*) 3.5 - 5.2 g/dL   AST 22  0 - 37 U/L   ALT 12  0 - 35 U/L   Alkaline Phosphatase 67  39 - 117 U/L   Total Bilirubin 0.2 (*) 0.3 - 1.2 mg/dL   GFR calc non Af Amer >90  >90 mL/min   GFR calc Af Amer >90  >90 mL/min   Anion gap 8  5 - 15  MAGNESIUM      Result Value Ref Range   Magnesium 1.7  1.5 - 2.5 mg/dL  PHOSPHORUS      Result Value Ref Range   Phosphorus 2.9  2.3 - 4.6 mg/dL  CBC      Result Value Ref Range   WBC 17.2 (*) 4.0 - 10.5 K/uL   RBC 3.45 (*) 3.87 - 5.11 MIL/uL   Hemoglobin 7.7 (*) 12.0 - 15.0 g/dL   HCT 25.1 (*) 36.0 - 46.0 %   MCV 72.8 (*) 78.0 - 100.0 fL   MCH 22.3 (*) 26.0 - 34.0 pg   MCHC 30.7  30.0 - 36.0 g/dL   RDW 18.5 (*) 11.5 - 15.5 %   Platelets 161  150 - 400 K/uL  DIFFERENTIAL      Result Value Ref Range   Neutrophils Relative % 84 (*) 43 - 77 %   Lymphocytes Relative 7 (*) 12 - 46 %   Monocytes Relative 9  3 - 12 %   Eosinophils Relative 0  0 - 5 %   Basophils Relative 0  0 - 1 %   Neutro Abs 14.5 (*) 1.7 - 7.7 K/uL   Lymphs Abs 1.2  0.7 - 4.0 K/uL   Monocytes Absolute 1.5 (*) 0.1 - 1.0 K/uL   Eosinophils Absolute 0.0  0.0 - 0.7 K/uL   Basophils Absolute 0.0  0.0 - 0.1 K/uL   RBC Morphology ELLIPTOCYTES    TRIGLYCERIDES      Result Value Ref Range   Triglycerides 146  <150 mg/dL  PREALBUMIN      Result Value Ref Range   Prealbumin 3.1 (*) 17.0 - 34.0 mg/dL  URINALYSIS, ROUTINE W REFLEX MICROSCOPIC      Result Value Ref Range   Color, Urine YELLOW  YELLOW   APPearance CLEAR  CLEAR   Specific Gravity, Urine 1.008  1.005 - 1.030   pH 8.0  5.0 - 8.0   Glucose, UA NEGATIVE  NEGATIVE mg/dL   Hgb urine  dipstick NEGATIVE  NEGATIVE   Bilirubin Urine NEGATIVE  NEGATIVE   Ketones, ur NEGATIVE  NEGATIVE mg/dL   Protein, ur NEGATIVE  NEGATIVE mg/dL   Urobilinogen, UA 0.2  0.0 - 1.0 mg/dL   Nitrite NEGATIVE  NEGATIVE   Leukocytes, UA NEGATIVE  NEGATIVE  GLUCOSE, CAPILLARY      Result Value Ref Range   Glucose-Capillary 125 (*) 70 - 99 mg/dL  GLUCOSE, CAPILLARY      Result Value Ref Range   Glucose-Capillary 120 (*) 70 - 99 mg/dL  GLUCOSE, CAPILLARY      Result Value Ref Range   Glucose-Capillary 125 (*) 70 - 99 mg/dL   Comment 1 Documented in Chart     Comment 2 Notify RN    GLUCOSE, CAPILLARY      Result Value Ref Range   Glucose-Capillary 123 (*) 70 - 99 mg/dL  GLUCOSE, CAPILLARY      Result Value Ref Range   Glucose-Capillary 128 (*) 70 - 99 mg/dL  GLUCOSE, CAPILLARY      Result Value Ref Range   Glucose-Capillary 134 (*) 70 - 99 mg/dL  CBC      Result Value Ref Range   WBC 18.4 (*) 4.0 - 10.5 K/uL   RBC 3.53 (*) 3.87 - 5.11 MIL/uL   Hemoglobin 8.0 (*) 12.0 - 15.0 g/dL   HCT 25.9 (*) 36.0 - 46.0 %   MCV 73.4 (*) 78.0 - 100.0 fL   MCH 22.7 (*) 26.0 - 34.0 pg   MCHC 30.9  30.0 - 36.0 g/dL   RDW 18.7 (*) 11.5 - 15.5 %   Platelets 212  150 - 400 K/uL  MAGNESIUM      Result Value Ref Range   Magnesium 1.8  1.5 - 2.5 mg/dL  COMPREHENSIVE METABOLIC PANEL      Result Value Ref Range   Sodium 130 (*) 137 - 147 mEq/L   Potassium 3.7  3.7 - 5.3 mEq/L   Chloride 96  96 - 112 mEq/L   CO2 25  19 - 32 mEq/L   Glucose, Bld 140 (*) 70 - 99 mg/dL   BUN 8  6 - 23 mg/dL   Creatinine, Ser 0.47 (*) 0.50 - 1.10 mg/dL   Calcium 8.5  8.4 - 10.5 mg/dL   Total Protein 6.8  6.0 - 8.3 g/dL   Albumin 1.6 (*) 3.5 - 5.2 g/dL   AST 15  0 - 37 U/L   ALT 10  0 - 35 U/L   Alkaline Phosphatase 85  39 - 117 U/L   Total  Bilirubin <0.2 (*) 0.3 - 1.2 mg/dL   GFR calc non Af Amer >90  >90 mL/min   GFR calc Af Amer >90  >90 mL/min   Anion gap 9  5 - 15  GLUCOSE, CAPILLARY      Result Value Ref Range    Glucose-Capillary 120 (*) 70 - 99 mg/dL  GLUCOSE, CAPILLARY      Result Value Ref Range   Glucose-Capillary 132 (*) 70 - 99 mg/dL   Comment 1 Notify RN     Comment 2 Documented in Chart    GLUCOSE, CAPILLARY      Result Value Ref Range   Glucose-Capillary 121 (*) 70 - 99 mg/dL  URINALYSIS, ROUTINE W REFLEX MICROSCOPIC      Result Value Ref Range   Color, Urine YELLOW  YELLOW   APPearance CLEAR  CLEAR   Specific Gravity, Urine 1.013  1.005 - 1.030   pH 7.5  5.0 - 8.0   Glucose, UA NEGATIVE  NEGATIVE mg/dL   Hgb urine dipstick NEGATIVE  NEGATIVE   Bilirubin Urine NEGATIVE  NEGATIVE   Ketones, ur NEGATIVE  NEGATIVE mg/dL   Protein, ur 30 (*) NEGATIVE mg/dL   Urobilinogen, UA 0.2  0.0 - 1.0 mg/dL   Nitrite NEGATIVE  NEGATIVE   Leukocytes, UA NEGATIVE  NEGATIVE  LEGIONELLA ANTIGEN, URINE      Result Value Ref Range   Specimen Description URINE, CATHETERIZED     Special Requests NONE     Legionella Antigen, Urine       Value: Negative for Legionella pneumophilia serogroup 1     Performed at [facility]   Report Status 10/15/2013 FINAL    STREP PNEUMONIAE URINARY ANTIGEN      Result Value Ref Range   Strep Pneumo Urinary Antigen NEGATIVE  NEGATIVE  GLUCOSE, CAPILLARY      Result Value Ref Range   Glucose-Capillary 136 (*) 70 - 99 mg/dL   Comment 1 Documented in Chart     Comment 2 Notify RN    URINE MICROSCOPIC-ADD ON      Result Value Ref Range   Squamous Epithelial / LPF RARE  RARE   WBC, UA 0-2  <3 WBC/hpf   RBC / HPF 0-2  <3 RBC/hpf  CBC      Result Value Ref Range   WBC 18.1 (*) 4.0 - 10.5 K/uL   RBC 3.43 (*) 3.87 - 5.11 MIL/uL   Hemoglobin 7.8 (*) 12.0 - 15.0 g/dL   HCT 25.0 (*) 36.0 - 46.0 %   MCV 72.9 (*) 78.0 - 100.0 fL   MCH 22.7 (*) 26.0 - 34.0 pg   MCHC 31.2  30.0 - 36.0 g/dL   RDW 18.5 (*) 11.5 - 15.5 %   Platelets 205  150 - 400 K/uL  BASIC METABOLIC PANEL      Result Value Ref Range   Sodium 134 (*) 137 - 147 mEq/L   Potassium 3.6 (*) 3.7  - 5.3 mEq/L   Chloride 97  96 - 112 mEq/L   CO2 25  19 - 32 mEq/L   Glucose, Bld 137 (*) 70 - 99 mg/dL   BUN 8  6 - 23 mg/dL   Creatinine, Ser 0.46 (*) 0.50 - 1.10 mg/dL   Calcium 8.6  8.4 - 10.5 mg/dL   GFR calc non Af Amer >90  >90 mL/min   GFR calc Af Amer >90  >90 mL/min   Anion gap 12  5 - 15  GLUCOSE, CAPILLARY      Result Value Ref Range   Glucose-Capillary 136 (*) 70 - 99 mg/dL  GLUCOSE, CAPILLARY      Result Value Ref Range   Glucose-Capillary 141 (*) 70 - 99 mg/dL   Comment 1 Documented in Chart     Comment 2 Notify RN    GLUCOSE, CAPILLARY      Result Value Ref Range   Glucose-Capillary 135 (*) 70 - 99 mg/dL   Comment 1 Notify RN     Comment 2 Documented in Chart    GLUCOSE, CAPILLARY      Result Value Ref Range   Glucose-Capillary 124 (*) 70 - 99 mg/dL  COMPREHENSIVE METABOLIC PANEL      Result Value Ref Range   Sodium 134 (*) 137 - 147 mEq/L   Potassium 3.6 (*) 3.7 - 5.3 mEq/L   Chloride 99  96 - 112 mEq/L   CO2 26  19 - 32 mEq/L   Glucose, Bld 117 (*) 70 - 99 mg/dL   BUN 7  6 - 23 mg/dL   Creatinine, Ser 0.38 (*) 0.50 - 1.10 mg/dL   Calcium 8.8  8.4 - 10.5 mg/dL   Total Protein 7.0  6.0 - 8.3 g/dL   Albumin 1.6 (*) 3.5 - 5.2 g/dL   AST 29  0 - 37 U/L   ALT 19  0 - 35 U/L   Alkaline Phosphatase 165 (*) 39 - 117 U/L   Total Bilirubin 0.3  0.3 - 1.2 mg/dL   GFR calc non Af Amer >90  >90 mL/min   GFR calc Af Amer >90  >90 mL/min   Anion gap 9  5 - 15  MAGNESIUM      Result Value Ref Range   Magnesium 2.0  1.5 - 2.5 mg/dL  PHOSPHORUS      Result Value Ref Range   Phosphorus 4.4  2.3 - 4.6 mg/dL  CBC      Result Value Ref Range   WBC 18.6 (*) 4.0 - 10.5 K/uL   RBC 3.36 (*) 3.87 - 5.11 MIL/uL   Hemoglobin 7.6 (*) 12.0 - 15.0 g/dL   HCT 24.6 (*) 36.0 - 46.0 %   MCV 73.2 (*) 78.0 - 100.0 fL   MCH 22.6 (*) 26.0 - 34.0 pg   MCHC 30.9  30.0 - 36.0 g/dL   RDW 18.9 (*) 11.5 - 15.5 %   Platelets 207  150 - 400 K/uL  VANCOMYCIN, TROUGH      Result Value  Ref Range   Vancomycin Tr 9.8 (*) 10.0 - 20.0 ug/mL  GLUCOSE, CAPILLARY      Result Value Ref Range   Glucose-Capillary 114 (*) 70 - 99 mg/dL  GLUCOSE, CAPILLARY      Result Value Ref Range   Glucose-Capillary 125 (*) 70 - 99 mg/dL  GLUCOSE, CAPILLARY      Result Value Ref Range   Glucose-Capillary 116 (*) 70 - 99 mg/dL  GLUCOSE, CAPILLARY      Result Value Ref Range   Glucose-Capillary 127 (*) 70 - 99 mg/dL  BASIC METABOLIC PANEL      Result Value Ref Range   Sodium 133 (*) 137 - 147 mEq/L   Potassium 3.8  3.7 - 5.3 mEq/L   Chloride 98  96 - 112 mEq/L   CO2 24  19 - 32 mEq/L   Glucose, Bld 138 (*) 70 - 99 mg/dL   BUN 8  6 - 23 mg/dL  Creatinine, Ser 0.44 (*) 0.50 - 1.10 mg/dL   Calcium 8.6  8.4 - 10.5 mg/dL   GFR calc non Af Amer >90  >90 mL/min   GFR calc Af Amer >90  >90 mL/min   Anion gap 11  5 - 15  CBC      Result Value Ref Range   WBC 16.1 (*) 4.0 - 10.5 K/uL   RBC 3.16 (*) 3.87 - 5.11 MIL/uL   Hemoglobin 7.2 (*) 12.0 - 15.0 g/dL   HCT 23.2 (*) 36.0 - 46.0 %   MCV 73.4 (*) 78.0 - 100.0 fL   MCH 22.8 (*) 26.0 - 34.0 pg   MCHC 31.0  30.0 - 36.0 g/dL   RDW 19.5 (*) 11.5 - 15.5 %   Platelets 248  150 - 400 K/uL  GLUCOSE, CAPILLARY      Result Value Ref Range   Glucose-Capillary 109 (*) 70 - 99 mg/dL  TROPONIN I      Result Value Ref Range   Troponin I <0.30  <0.30 ng/mL  TROPONIN I      Result Value Ref Range   Troponin I <0.30  <0.30 ng/mL  GLUCOSE, CAPILLARY      Result Value Ref Range   Glucose-Capillary 141 (*) 70 - 99 mg/dL   Comment 1 Documented in Chart     Comment 2 Notify RN    GLUCOSE, CAPILLARY      Result Value Ref Range   Glucose-Capillary 108 (*) 70 - 99 mg/dL  VANCOMYCIN, TROUGH      Result Value Ref Range   Vancomycin Tr 13.1  10.0 - 20.0 ug/mL  GLUCOSE, CAPILLARY      Result Value Ref Range   Glucose-Capillary 123 (*) 70 - 99 mg/dL  BASIC METABOLIC PANEL      Result Value Ref Range   Sodium 134 (*) 137 - 147 mEq/L   Potassium 3.7   3.7 - 5.3 mEq/L   Chloride 100  96 - 112 mEq/L   CO2 24  19 - 32 mEq/L   Glucose, Bld 116 (*) 70 - 99 mg/dL   BUN 9  6 - 23 mg/dL   Creatinine, Ser 0.44 (*) 0.50 - 1.10 mg/dL   Calcium 8.8  8.4 - 10.5 mg/dL   GFR calc non Af Amer >90  >90 mL/min   GFR calc Af Amer >90  >90 mL/min   Anion gap 10  5 - 15  CBC      Result Value Ref Range   WBC 17.8 (*) 4.0 - 10.5 K/uL   RBC 3.18 (*) 3.87 - 5.11 MIL/uL   Hemoglobin 7.2 (*) 12.0 - 15.0 g/dL   HCT 23.6 (*) 36.0 - 46.0 %   MCV 74.2 (*) 78.0 - 100.0 fL   MCH 22.6 (*) 26.0 - 34.0 pg   MCHC 30.5  30.0 - 36.0 g/dL   RDW 19.7 (*) 11.5 - 15.5 %   Platelets 346  150 - 400 K/uL  GLUCOSE, CAPILLARY      Result Value Ref Range   Glucose-Capillary 124 (*) 70 - 99 mg/dL  GLUCOSE, CAPILLARY      Result Value Ref Range   Glucose-Capillary 148 (*) 70 - 99 mg/dL  GLUCOSE, CAPILLARY      Result Value Ref Range   Glucose-Capillary 132 (*) 70 - 99 mg/dL  BASIC METABOLIC PANEL      Result Value Ref Range   Sodium 134 (*) 137 - 147 mEq/L   Potassium  3.8  3.7 - 5.3 mEq/L   Chloride 100  96 - 112 mEq/L   CO2 22  19 - 32 mEq/L   Glucose, Bld 107 (*) 70 - 99 mg/dL   BUN 9  6 - 23 mg/dL   Creatinine, Ser 0.48 (*) 0.50 - 1.10 mg/dL   Calcium 8.7  8.4 - 10.5 mg/dL   GFR calc non Af Amer >90  >90 mL/min   GFR calc Af Amer >90  >90 mL/min   Anion gap 12  5 - 15  CBC      Result Value Ref Range   WBC 18.6 (*) 4.0 - 10.5 K/uL   RBC 3.14 (*) 3.87 - 5.11 MIL/uL   Hemoglobin 7.2 (*) 12.0 - 15.0 g/dL   HCT 23.2 (*) 36.0 - 46.0 %   MCV 73.9 (*) 78.0 - 100.0 fL   MCH 22.9 (*) 26.0 - 34.0 pg   MCHC 31.0  30.0 - 36.0 g/dL   RDW 19.9 (*) 11.5 - 15.5 %   Platelets 424 (*) 150 - 400 K/uL  GLUCOSE, CAPILLARY      Result Value Ref Range   Glucose-Capillary 122 (*) 70 - 99 mg/dL  GLUCOSE, CAPILLARY      Result Value Ref Range   Glucose-Capillary 121 (*) 70 - 99 mg/dL  VANCOMYCIN, TROUGH      Result Value Ref Range   Vancomycin Tr 20.6 (*) 10.0 - 20.0 ug/mL   GLUCOSE, CAPILLARY      Result Value Ref Range   Glucose-Capillary 122 (*) 70 - 99 mg/dL  GLUCOSE, CAPILLARY      Result Value Ref Range   Glucose-Capillary 117 (*) 70 - 99 mg/dL  HIV ANTIBODY (ROUTINE TESTING)      Result Value Ref Range   HIV 1&2 Ab, 4th Generation NONREACTIVE  NONREACTIVE  AMYLASE      Result Value Ref Range   Amylase 173 (*) 0 - 105 U/L  LIPASE, BLOOD      Result Value Ref Range   Lipase 121 (*) 11 - 59 U/L  COMPREHENSIVE METABOLIC PANEL      Result Value Ref Range   Sodium 134 (*) 137 - 147 mEq/L   Potassium 3.8  3.7 - 5.3 mEq/L   Chloride 100  96 - 112 mEq/L   CO2 22  19 - 32 mEq/L   Glucose, Bld 108 (*) 70 - 99 mg/dL   BUN 9  6 - 23 mg/dL   Creatinine, Ser 0.47 (*) 0.50 - 1.10 mg/dL   Calcium 8.7  8.4 - 10.5 mg/dL   Total Protein 7.7  6.0 - 8.3 g/dL   Albumin 1.8 (*) 3.5 - 5.2 g/dL   AST 142 (*) 0 - 37 U/L   ALT 131 (*) 0 - 35 U/L   Alkaline Phosphatase 481 (*) 39 - 117 U/L   Total Bilirubin <0.2 (*) 0.3 - 1.2 mg/dL   GFR calc non Af Amer >90  >90 mL/min   GFR calc Af Amer >90  >90 mL/min   Anion gap 12  5 - 15  MAGNESIUM      Result Value Ref Range   Magnesium 1.9  1.5 - 2.5 mg/dL  PHOSPHORUS      Result Value Ref Range   Phosphorus 4.5  2.3 - 4.6 mg/dL  CBC      Result Value Ref Range   WBC 22.1 (*) 4.0 - 10.5 K/uL   RBC 3.17 (*) 3.87 - 5.11 MIL/uL  Hemoglobin 7.3 (*) 12.0 - 15.0 g/dL   HCT 23.7 (*) 36.0 - 46.0 %   MCV 74.8 (*) 78.0 - 100.0 fL   MCH 23.0 (*) 26.0 - 34.0 pg   MCHC 30.8  30.0 - 36.0 g/dL   RDW 20.1 (*) 11.5 - 15.5 %   Platelets 514 (*) 150 - 400 K/uL  DIFFERENTIAL      Result Value Ref Range   Neutrophils Relative % 82 (*) 43 - 77 %   Lymphocytes Relative 7 (*) 12 - 46 %   Monocytes Relative 9  3 - 12 %   Eosinophils Relative 1  0 - 5 %   Basophils Relative 1  0 - 1 %   Neutro Abs 18.2 (*) 1.7 - 7.7 K/uL   Lymphs Abs 1.5  0.7 - 4.0 K/uL   Monocytes Absolute 2.0 (*) 0.1 - 1.0 K/uL   Eosinophils Absolute 0.2  0.0 -  0.7 K/uL   Basophils Absolute 0.2 (*) 0.0 - 0.1 K/uL   RBC Morphology POLYCHROMASIA PRESENT     WBC Morphology       Value: MODERATE LEFT SHIFT (>5% METAS AND MYELOS,OCC PRO NOTED)  TRIGLYCERIDES      Result Value Ref Range   Triglycerides 130  <150 mg/dL  GLUCOSE, CAPILLARY      Result Value Ref Range   Glucose-Capillary 124 (*) 70 - 99 mg/dL  GLUCOSE, CAPILLARY      Result Value Ref Range   Glucose-Capillary 119 (*) 70 - 99 mg/dL  GLUCOSE, CAPILLARY      Result Value Ref Range   Glucose-Capillary 129 (*) 70 - 99 mg/dL  GLUCOSE, CAPILLARY      Result Value Ref Range   Glucose-Capillary 122 (*) 70 - 99 mg/dL  LIPASE, BLOOD      Result Value Ref Range   Lipase 151 (*) 11 - 59 U/L   A/P: Pt is POD #10 from subtotal gastrectomy/Roux-en-Y gastrojejunostomy/porta hepatis-peripancreatic LN dissection for gastric adenocarcinoma(signet ring type)/GOO. She has increasing WBC, abd pain and purulent drainage from RLQ surgical drain. Imaging studies reveal fluid collections inferior to spleen and in presacral space. At this time collections are too small for drainage cath placement (LUQ collection would entail crossing pleura with drain and could create potential pleural contamination ). However,  both areas are amenable to aspiration. Details/risks of procedure d/w pt with her understanding and consent . Since pt had lovenox today will plan case for 7/28. Pt also noted to be Jehovah's witness.

## 2013-10-20 NOTE — Progress Notes (Signed)
Seen, agree with above.  Will get CT scan chest/abd/pelvis since leukocytosis is worsening, and she is still having low grade fevers.  Drain output also purulent.  Has had PNA, so will also rule out worsening or empyema.

## 2013-10-20 NOTE — Progress Notes (Signed)
[facility] Progress Note  10 Days Post-Op  Subjective: Patient feels great, no N/V, tolerating clears.  Abdominal pain much improved.  IS at 1000.  Says lungs are better.  Drain putting out purulent drainage.  Objective: Vital signs in last 24 hours: Temp:  [98.4 F (36.9 C)-99.8 F (37.7 C)] 98.4 F (36.9 C) (07/27 0636) Pulse Rate:  [112-120] 112 (07/27 0636) Resp:  [18-20] 18 (07/27 0636) BP: (109-121)/(56-65) 111/59 mmHg (07/27 0636) SpO2:  [96 %-100 %] 100 % (07/27 0636) Last BM Date: 10/19/13  Intake/Output from previous day: 07/26 0701 - 07/27 0700 In: 3166 [P.O.:480; I.V.:660; IV Piggyback:580; TPN:1446] Out: 0  Intake/Output this shift:    PE: Gen:  Alert, NAD, pleasant Abd: Soft, NT/ND, +BS, no HSM, incisions C/D/I with staples in place, drain with yellow/tan cloudy purulent drainage, approximately 32mL drainage in JP drain (but the nurses have not been recording I&O)   Lab Results:   Recent Labs  10/19/13 0405 10/20/13 0500  WBC 18.6* 22.1*  HGB 7.2* 7.3*  HCT 23.2* 23.7*  PLT 424* 514*   BMET  Recent Labs  10/19/13 0405 10/20/13 0500  NA 134* 134*  K 3.8 3.8  CL 100 100  CO2 22 22  GLUCOSE 107* 108*  BUN 9 9  CREATININE 0.48* 0.47*  CALCIUM 8.7 8.7   PT/INR No results found for this basename: LABPROT, INR,  in the last 72 hours CMP     Component Value Date/Time   NA 134* 10/20/2013 0500   K 3.8 10/20/2013 0500   CL 100 10/20/2013 0500   CO2 22 10/20/2013 0500   GLUCOSE 108* 10/20/2013 0500   BUN 9 10/20/2013 0500   CREATININE 0.47* 10/20/2013 0500   CREATININE 0.63 05/26/2013 1517   CALCIUM 8.7 10/20/2013 0500   PROT 7.7 10/20/2013 0500   ALBUMIN 1.8* 10/20/2013 0500   AST 142* 10/20/2013 0500   ALT 131* 10/20/2013 0500   ALKPHOS 481* 10/20/2013 0500   BILITOT <0.2* 10/20/2013 0500   GFRNONAA >90 10/20/2013 0500   GFRAA >90 10/20/2013 0500   Lipase     Component Value Date/Time   LIPASE 121* 10/19/2013 1655        Studies/Results: Dg Chest Port 1 View  10/18/2013   CLINICAL DATA:  Chest pain  EXAM: PORTABLE CHEST - 1 VIEW  COMPARISON:  October 14, 2013  FINDINGS: Nasogastric tube is been removed. Central catheter tip is at the cavoatrial junction. No pneumothorax. There is consolidation in the left base with small left effusion. There is atelectatic change in the right base. Heart is mildly enlarged with normal pulmonary vascularity. No adenopathy.  IMPRESSION: Airspace consolidation left lower lobe with small left effusion. Right base atelectasis. Central catheter tip at cavoatrial junction. No pneumothorax. No change in cardiac silhouette.   Electronically Signed   By: Lowella Grip M.D.   On: 10/18/2013 14:16    Anti-infectives: Anti-infectives   Start     Dose/Rate Route Frequency Ordered Stop   10/20/13 1000  hydroxychloroquine (PLAQUENIL) tablet 200 mg     200 mg Oral 2 times daily 10/20/13 0906     10/19/13 2200  vancomycin (VANCOCIN) IVPB 1000 mg/200 mL premix     1,000 mg 200 mL/hr over 60 Minutes Intravenous Every 8 hours 10/19/13 1402     10/17/13 2315  vancomycin (VANCOCIN) 1,250 mg in sodium chloride 0.9 % 250 mL IVPB  Status:  Discontinued     1,250 mg 166.7 mL/hr over 90 Minutes  Intravenous 3 times per day 10/17/13 2303 10/19/13 1337   10/16/13 0830  vancomycin (VANCOCIN) IVPB 1000 mg/200 mL premix  Status:  Discontinued     1,000 mg 200 mL/hr over 60 Minutes Intravenous 3 times per day 10/16/13 0730 10/17/13 2303   10/14/13 2200  vancomycin (VANCOCIN) IVPB 750 mg/150 ml premix  Status:  Discontinued     750 mg 150 mL/hr over 60 Minutes Intravenous Every 8 hours 10/14/13 1158 10/16/13 0730   10/14/13 1400  ceFEPIme (MAXIPIME) 1 g in dextrose 5 % 50 mL IVPB     1 g 100 mL/hr over 30 Minutes Intravenous 3 times per day 10/14/13 1125 10/22/13 1359   10/14/13 1400  vancomycin (VANCOCIN) 1,500 mg in sodium chloride 0.9 % 500 mL IVPB     1,500 mg 250 mL/hr over 120 Minutes  Intravenous  Once 10/14/13 1158 10/14/13 1658   10/12/13 1600  levofloxacin (LEVAQUIN) IVPB 500 mg  Status:  Discontinued     500 mg 100 mL/hr over 60 Minutes Intravenous Every 24 hours 10/12/13 1545 10/14/13 1124   10/10/13 1230  ceFAZolin (ANCEF) IVPB 2 g/50 mL premix     2 g 100 mL/hr over 30 Minutes Intravenous  Once 10/10/13 1220 10/10/13 1222   10/10/13 0600  [MAR Hold]  ceFAZolin (ANCEF) IVPB 2 g/50 mL premix     (On MAR Hold since 10/10/13 0659)   2 g 100 mL/hr over 30 Minutes Intravenous On call to O.R. 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)  POD #10 s/p subtotal gastrectomy with Roux-en-Y gastrojejunostomy & porta hepatis and peripancreatic lymph node dissection  Intractable N/V - resolved  Microcytic anemia/blood loss anemia  Leukocytosis - at 22.1 today  ABL Anemia -  Hgb 7.3 PCM on TPN  Urinary retention - resolved  HCAP   Plan:  -Afebrile, WBC trending back up to 22.1, tachycardic -Encourage incentive spirometer, ambulation, SCD's, and would not start heparin or lovenox yet given low Hgb/Hct and patients blood administration preferences  -Hgb 7.3 today, pt does not desire blood because she is a Jehovah's witness  -Continue TPN, NPO, IVF, pain control, antiemetics  -UGI showed no leak -Work to decrease IV narcotics, orals when we resume diet -Order CT Abd/pelvis and CT chest for possible PE, LE doppler shows no DVT -Likely has intra-abdominal fluid collection based on change of JP drain output    LOS: 12 days    Andrea Best, Andrea Best 10/20/2013, 9:37 AM Pager: [phone]

## 2013-10-20 NOTE — Progress Notes (Signed)
NUTRITION FOLLOW UP  Intervention:   -Diet advancement per MD -Recommend [employer] supplement TID as tolerated -TPN per pharmacy -Will continue to monitor  Nutrition Dx:   Inadequate oral intake related to nausea/stomach pains as evidenced by PO intake <75%, 16% body weight loss in 4 months; ongoing, now as evidenced by NPO status  Goal:   TPN to meet >/= 90% of their estimated nutrition needs    Monitor:   Diet order, GI profile, TPN tolerance, total protein/energy intake, labs, weights  Assessment:   7/16: -Pt reported unintentional weight loss of 30 lbs since 05/2013.  -Follows vegetarian diet. Eats vegetables, pasta, eggs, dairy and cheese. Has had appetite for past 4 months; however has had to decrease intake d/t stomach pain and nausea post meals. Diet recall indicates pt tolerating liquid diet for past 2-3 weeks d/t inability to tolerate solid foods. Consumes one Boost supplement daily, has been on regimen for one week. Family prepares smoothies with protein powder for additional nutrition  -Pt tolerating < 50% of clear liquid diet. Denied nausea, having small amount of stomach pains  -Was willing to trial [employer] supplement, provided pt with sample, which she tolerated better with soda. Provided pt with information to continue with supplement upon d/c as tolerated  -Pt to have PICC lined placed for initiation of TPN, RN noted possibly to occur later today.  -K low, Phos slightly elevated.  7/23: -Pt s/p subtotal gastrectomy with porta hepatis and peripancreatic lymph node dissection with a Roux-en-Y gastrojejunostomy on 10/10/13 -NGT with 150 ml output, bowel function has not yet returned. Surgery recommends to continue NPO status -Pharmacy noted pt receiving Clinimix 5/15 at goal rate of 80 ml/hr w/Lipids 20% at 10 ml/hr -This provides 1850 kcal (93% est minimum kcal needs, 84% est maximum kcal needs) and 96 gram protein (100% est protein needs) -Will recommend to  slightly increase TPN rate to meet > 90% est kcal needs; pt with decreased albumin/pre-albumin (also likely r/t to inflammatory process/gastric cancer), and MD noted pt with feelings of hunger/thirst -Phos/Mg WNL, K slightly low at 3.6 -CBG's WNL, <150 mg  7/27: -Per pharmacy note, TPN was modified to Clinimix E 5/20 at 80 ml/hr to provide 2196 kcal (100% est kcal needs), and 96 gram protein (100% est protein needs). Appreciate Pharmacy assistance -Phos/K/Mg WNL, Glucose < 150 mg/dl. Prealbumin pending. -LFT elevated, will continue to monitor. Consider cyclical TPN as needed. Will add supplements as diet advancement tolerated. -NGT clamped on 7/26. Was advanced to clears on 7/26, then full liquid on 7/27. Tolerated w/out nausea/vomiting. Receiving Reglan -Pt to undergo CT scan chest/abd/pelvis. Currently NPO.   Height: Ht Readings from Last 1 Encounters:  10/08/13 5\' 6"  (1.676 m)    Weight Status:   Wt Readings from Last 1 Encounters:  10/18/13 157 lb 3.2 oz (71.305 kg)  10/08/13 154 lbs  Re-estimated needs:  Kcal: 2000-2200  Protein: 85-100 gram  Fluid: >/=2100 ml/daily   Skin: WDL  Diet Order: NPO   Intake/Output Summary (Last 24 hours) at 10/20/13 1230 Last data filed at 10/20/13 0900  Gross per 24 hour  Intake   3286 ml  Output      0 ml  Net   3286 ml    Last BM: 7/26- hypoactive BS   Labs:   Recent Labs Lab 10/14/13 0450  10/16/13 0530  10/18/13 0455 10/19/13 0405 10/20/13 0500  NA 130*  < > 134*  < > 134* 134* 134*  K 3.7  < >  3.6*  < > 3.7 3.8 3.8  CL 96  < > 99  < > 100 100 100  CO2 25  < > 26  < > 24 22 22   BUN 8  < > 7  < > 9 9 9   CREATININE 0.47*  < > 0.38*  < > 0.44* 0.48* 0.47*  CALCIUM 8.5  < > 8.8  < > 8.8 8.7 8.7  MG 1.8  --  2.0  --   --   --  1.9  PHOS  --   --  4.4  --   --   --  4.5  GLUCOSE 140*  < > 117*  < > 116* 107* 108*  < > = values in this interval not displayed.  CBG (last 3)   Recent Labs  10/19/13 2013 10/20/13 0617  10/20/13 1154  GLUCAP 119* 129* 122*    Scheduled Meds: . antiseptic oral rinse  15 mL Mouth Rinse q12n4p  . ceFEPime (MAXIPIME) IV  1 g Intravenous 3 times per day  . chlorhexidine  15 mL Mouth Rinse BID  . enoxaparin (LOVENOX) injection  40 mg Subcutaneous Q24H  .  HYDROmorphone (DILAUDID) injection  2 mg Intravenous Q4H  . hydroxychloroquine  200 mg Oral BID  . insulin aspart  0-9 Units Subcutaneous 4 times per day  . methocarbamol (ROBAXIN) IV  1,000 mg Intravenous Q8H  . metoCLOPramide (REGLAN) injection  5 mg Intravenous 3 times per day  . ondansetron (ZOFRAN) IV  4 mg Intravenous 4 times per day  . pantoprazole (PROTONIX) IV  40 mg Intravenous QHS  . predniSONE  5 mg Oral BID WC  . sodium chloride  10-40 mL Intracatheter Q12H  . vancomycin  1,000 mg Intravenous Q8H    Continuous Infusions: . sodium chloride 30 mL/hr at 10/17/13 0514  . Marland KitchenTPN (CLINIMIX-E) Adult 80 mL/hr at 10/19/13 1824   And  . fat emulsion 250 mL (10/19/13 1824)  . Marland KitchenTPN (CLINIMIX-E) Adult     And  . fat emulsion      Atlee Abide MS RD LDN Clinical Dietitian [phone]

## 2013-10-21 ENCOUNTER — Inpatient Hospital Stay (HOSPITAL_COMMUNITY): Payer: 59

## 2013-10-21 DIAGNOSIS — T8140XA Infection following a procedure, unspecified, initial encounter: Secondary | ICD-10-CM

## 2013-10-21 DIAGNOSIS — J90 Pleural effusion, not elsewhere classified: Secondary | ICD-10-CM

## 2013-10-21 DIAGNOSIS — K651 Peritoneal abscess: Secondary | ICD-10-CM

## 2013-10-21 DIAGNOSIS — G893 Neoplasm related pain (acute) (chronic): Secondary | ICD-10-CM

## 2013-10-21 LAB — BODY FLUID CELL COUNT WITH DIFFERENTIAL
EOS FL: 2 %
Lymphs, Fluid: 49 %
Monocyte-Macrophage-Serous Fluid: 10 % — ABNORMAL LOW (ref 50–90)
NEUTROPHIL FLUID: 39 % — AB (ref 0–25)
WBC FLUID: UNDETERMINED uL (ref 0–1000)

## 2013-10-21 LAB — BASIC METABOLIC PANEL
Anion gap: 13 (ref 5–15)
BUN: 10 mg/dL (ref 6–23)
CALCIUM: 8.9 mg/dL (ref 8.4–10.5)
CHLORIDE: 98 meq/L (ref 96–112)
CO2: 23 meq/L (ref 19–32)
Creatinine, Ser: 0.43 mg/dL — ABNORMAL LOW (ref 0.50–1.10)
GFR calc Af Amer: >90 mL/min (ref >90)
GFR calc non Af Amer: >90 mL/min (ref >90)
GLUCOSE: 120 mg/dL — AB (ref 70–99)
POTASSIUM: 4.1 meq/L (ref 3.7–5.3)
SODIUM: 134 meq/L — AB (ref 137–147)

## 2013-10-21 LAB — IRON AND TIBC
Iron: 33 ug/dL — ABNORMAL LOW (ref 42–135)
Saturation Ratios: 17 % — ABNORMAL LOW (ref 20–55)
TIBC: 196 ug/dL — ABNORMAL LOW (ref 250–470)
UIBC: 163 ug/dL (ref 125–400)

## 2013-10-21 LAB — CBC
HEMATOCRIT: 23.7 % — AB (ref 36.0–46.0)
Hemoglobin: 7 g/dL — ABNORMAL LOW (ref 12.0–15.0)
MCH: 22.7 pg — ABNORMAL LOW (ref 26.0–34.0)
MCHC: 29.5 g/dL — ABNORMAL LOW (ref 30.0–36.0)
MCV: 76.7 fL — ABNORMAL LOW (ref 78.0–100.0)
Platelets: 542 10*3/uL — ABNORMAL HIGH (ref 150–400)
RBC: 3.09 MIL/uL — AB (ref 3.87–5.11)
RDW: 20.4 % — ABNORMAL HIGH (ref 11.5–15.5)
WBC: 23.7 10*3/uL — AB (ref 4.0–10.5)

## 2013-10-21 LAB — GLUCOSE, CAPILLARY
GLUCOSE-CAPILLARY: 121 mg/dL — AB (ref 70–99)
GLUCOSE-CAPILLARY: 122 mg/dL — AB (ref 70–99)
Glucose-Capillary: 118 mg/dL — ABNORMAL HIGH (ref 70–99)

## 2013-10-21 LAB — PROTEIN, BODY FLUID: Total protein, fluid: 5.3 g/dL

## 2013-10-21 LAB — PROTIME-INR
INR: 1.1 (ref 0.00–1.49)
Prothrombin Time: 14.2 seconds (ref 11.6–15.2)

## 2013-10-21 LAB — LACTATE DEHYDROGENASE, PLEURAL OR PERITONEAL FLUID: LD FL: 392 U/L — AB (ref 3–23)

## 2013-10-21 LAB — LACTATE DEHYDROGENASE: LDH: 428 U/L — ABNORMAL HIGH (ref 94–250)

## 2013-10-21 LAB — PROTEIN, TOTAL: Total Protein: 7.5 g/dL (ref 6.0–8.3)

## 2013-10-21 LAB — APTT: APTT: 35 s (ref 24–37)

## 2013-10-21 LAB — VITAMIN B12: VITAMIN B 12: 1135 pg/mL — AB (ref 211–911)

## 2013-10-21 MED ORDER — MIDAZOLAM HCL 2 MG/2ML IJ SOLN
INTRAMUSCULAR | Status: AC | PRN
  Administered 2013-10-21 (×4): 1 mg via INTRAVENOUS

## 2013-10-21 MED ORDER — FLUCONAZOLE IN SODIUM CHLORIDE 200-0.9 MG/100ML-% IV SOLN
200.0000 mg | INTRAVENOUS | Status: DC
  Administered 2013-10-21: 200 mg via INTRAVENOUS
  Filled 2013-10-21: qty 100

## 2013-10-21 MED ORDER — HYDROMORPHONE HCL PF 2 MG/ML IJ SOLN
2.0000 mg | INTRAMUSCULAR | Status: DC | PRN
  Administered 2013-10-21 – 2013-10-27 (×7): 2 mg via INTRAVENOUS
  Filled 2013-10-21 (×7): qty 1

## 2013-10-21 MED ORDER — FENTANYL CITRATE 0.05 MG/ML IJ SOLN
INTRAMUSCULAR | Status: AC | PRN
  Administered 2013-10-21 (×2): 50 ug via INTRAVENOUS

## 2013-10-21 MED ORDER — MIDAZOLAM HCL 2 MG/2ML IJ SOLN
INTRAMUSCULAR | Status: AC
  Filled 2013-10-21: qty 6

## 2013-10-21 MED ORDER — OXYCODONE HCL 5 MG PO TABS
5.0000 mg | ORAL_TABLET | ORAL | Status: DC | PRN
  Administered 2013-10-22 (×3): 10 mg via ORAL
  Administered 2013-10-23 (×4): 15 mg via ORAL
  Administered 2013-10-24: 10 mg via ORAL
  Administered 2013-10-24 – 2013-10-25 (×6): 15 mg via ORAL
  Administered 2013-10-25: 10 mg via ORAL
  Administered 2013-10-26 (×2): 15 mg via ORAL
  Administered 2013-10-26: 10 mg via ORAL
  Administered 2013-10-27: 5 mg via ORAL
  Filled 2013-10-21 (×5): qty 3
  Filled 2013-10-21: qty 2
  Filled 2013-10-21: qty 3
  Filled 2013-10-21: qty 2
  Filled 2013-10-21: qty 3
  Filled 2013-10-21: qty 2
  Filled 2013-10-21 (×3): qty 3
  Filled 2013-10-21: qty 2
  Filled 2013-10-21 (×2): qty 3
  Filled 2013-10-21 (×2): qty 2
  Filled 2013-10-21: qty 3

## 2013-10-21 MED ORDER — FAT EMULSION 20 % IV EMUL
250.0000 mL | INTRAVENOUS | Status: AC
  Administered 2013-10-21: 250 mL via INTRAVENOUS
  Filled 2013-10-21: qty 250

## 2013-10-21 MED ORDER — TRACE MINERALS CR-CU-F-FE-I-MN-MO-SE-ZN IV SOLN
INTRAVENOUS | Status: AC
  Administered 2013-10-21: 18:00:00 via INTRAVENOUS
  Filled 2013-10-21: qty 2000

## 2013-10-21 MED ORDER — DEXTROSE 50 % IV SOLN: 25.0000 mL | Freq: Once | INTRAVENOUS | Status: DC | PRN

## 2013-10-21 MED ORDER — FENTANYL CITRATE 0.05 MG/ML IJ SOLN
INTRAMUSCULAR | Status: AC
  Filled 2013-10-21: qty 6

## 2013-10-21 MED ORDER — SODIUM CHLORIDE 0.9 % IV SOLN
100.0000 mg | Freq: Every day | INTRAVENOUS | Status: DC
  Administered 2013-10-21 – 2013-10-27 (×7): 100 mg via INTRAVENOUS
  Filled 2013-10-21 (×7): qty 100

## 2013-10-21 NOTE — Consult Note (Signed)
Name: Andrea Best MRN: [id number] DOB: 02/25/1971    ADMISSION DATE:  10/08/2013 CONSULTATION DATE:  7/28  REFERRING MD :  [facility] PRIMARY SERVICE: [facility]  CHIEF COMPLAINT:  Pleural effusion  BRIEF PATIENT DESCRIPTION:  [name] with recent subtotal gastrectomy 7/17 (metastatic adenocarcinoma) and developed increasing abdomina pain and CT abd revealed abscess and drain was placed per IR. CT chest further revealed left lower lobe effusion/pna and PCCM was asked to evaluate.  PMH: Lupus Asthma Sjogren's dz. Eczema    SIGNIFICANT EVENTS / STUDIES:  7/17 subtotal gastrectomy for metastatic cancer 7/28 abdominal abscess drain per IR   LINES / TUBES: Rt picc 7/16>>  CULTURES: 7/26 bc>> 7/28 body fluid>> 7/28 pleural fluid>> ANTIBIOTICS: 7/27 diflucan>> 7/27 primaxin>> 7/26 vanco>>  HISTORY OF PRESENT ILLNESS:   43 yo (Jehovah Witness) AAF with recent subtotal gastrectomy 7/17 (metastatic adenocarcinoma) and developed increasing abdomina pain and CT abd revealed abscess and drain was placed per IR. CT chest further revealed left lower lobe effusion/pna.  She has an extensive history of Lupus, allergies, Asthma, Sjogren's dz.and has been on steroids and plaquenil for years. She developed vomiting and after EGD per Dr. Paulita Fujita was diagnosed with metastatic adenocarcinoma ,gastric, and was operated on 7/17. Now with new abd abscess post perq drain per IR 7/28 and CT chest reveals LLL effusion worrisome for infection. PCCM asked to evaluate for thoracentesis.   PAST MEDICAL HISTORY :  Past Medical History  Diagnosis Date  . Asthma   . Eczema   . Lupus   . Sjogren's disease   . Gastric cancer    Past Surgical History  Procedure Laterality Date  . Cesarean section      1997  . Laparotomy N/A 10/10/2013    Procedure: SUBTOTAL GASTRECTOMY WITH GASTRIC JEJUNOSOTMY;  Surgeon: Edward Jolly, MD;  Location: [facility];  Service: General;  Laterality: N/A;   Prior to  Admission medications   Medication Sig Start Date End Date Taking? Authorizing Provider  lansoprazole (PREVACID SOLUTAB) 15 MG disintegrating tablet Take 15 mg by mouth 2 (two) times daily before a meal.   Yes Historical Provider, MD  hydroxychloroquine (PLAQUENIL) 200 MG tablet Take 200 mg by mouth 2 (two) times daily.    Historical Provider, MD  predniSONE (DELTASONE) 5 MG tablet Take 5 mg by mouth 2 (two) times daily with a meal.    Historical Provider, MD   Allergies  Allergen Reactions  . Azithromycin Rash  . Sulfa Antibiotics Rash  . Hydrocodone-Acetaminophen Nausea And Vomiting    Hallucinating   . 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 SHE DOES NOT WANT ANY BLOOD PRODUCTS OR FRACTIONS    FAMILY HISTORY:  Family History  Problem Relation Age of Onset  . Hypertension Mother   . Diabetes Mother   . Diabetes Maternal Grandmother   . Diabetes Maternal Grandfather    SOCIAL HISTORY:  reports that she has never smoked. She does not have any smokeless tobacco history on file. She reports that she drinks alcohol. She reports that she does not use illicit drugs.  REVIEW OF SYSTEMS:   Constitutional: Negative for fever, chills, weight loss, malaise/fatigue and diaphoresis.  HENT: Negative for hearing loss, ear pain, nosebleeds, congestion, sore throat, neck pain, tinnitus and ear discharge.   Eyes: Negative for blurred vision, double vision, photophobia, pain, discharge and redness.  Respiratory: Negative for cough, hemoptysis, sputum production, shortness of breath, wheezing and stridor.   Cardiovascular: Negative for  chest pain, palpitations, orthopnea, claudication, leg swelling and PND.  Gastrointestinal: Negative for heartburn, nausea, vomiting, abdominal pain, diarrhea, constipation, blood in stool and melena.  Genitourinary: Negative for dysuria, urgency, frequency, hematuria and flank pain.  Musculoskeletal: Negative for myalgias, back pain, joint pain and falls.  Skin: Negative  for itching and rash.  Neurological: Negative for dizziness, tingling, tremors, sensory change, speech change, focal weakness, seizures, loss of consciousness, weakness and headaches.  Endo/Heme/Allergies: Negative for environmental allergies and polydipsia. Does not bruise/bleed easily.  SUBJECTIVE:   VITAL SIGNS: Temp:  [98 F (36.7 C)-99.4 F (37.4 C)] 98 F (36.7 C) (07/28 0535) Pulse Rate:  [97-168] 111 (07/28 1013) Resp:  [12-27] 12 (07/28 1013) BP: (100-121)/(52-79) 110/72 mmHg (07/28 1013) SpO2:  [97 %-100 %] 100 % (07/28 1013)  PHYSICAL EXAMINATION: General:  WNWDAAM nad AT REST Neuro:  Intact HEENT: No LAN/JVD, OP unremarkable Cardiovascular: HSR RRR Lungs:  Diminished left base otherwise clear Abdomen:  Rt flank drain noted, suture line intact with staples Musculoskeletal:  intact Skin:  warm   Recent Labs Lab 10/19/13 0405 10/20/13 0500 10/21/13 0510  NA 134* 134* 134*  K 3.8 3.8 4.1  CL 100 100 98  CO2 22 22 23   BUN 9 9 10   CREATININE 0.48* 0.47* 0.43*  GLUCOSE 107* 108* 120*    Recent Labs Lab 10/19/13 0405 10/20/13 0500 10/21/13 0510  HGB 7.2* 7.3* 7.0*  HCT 23.2* 23.7* 23.7*  WBC 18.6* 22.1* 23.7*  PLT 424* 514* 542*   ASSESSMENT / PLAN:  Left pleural effusion in 77 female with subtotal gastrectomy 7/17 for locally advanced adenocarcinoma and abd abscess drainage 7/28 with New left  pleural effusion cannot r/o infectious process.  Pan: We will order left thoracentesis per IR  Sent fluid for culture along with usual lab studies. Discussed risks & benefits of procedure with pt   Richardson Landry Minor ACNP Maryanna Shape PCCM Pager [phone] till 3 pm If no answer page [phone]  Independently examined pt, evaluated data & formulated above care plan with NP who scribed this note & edited by me. Discussed with Dr Christin Fudge MD  10/21/2013, 1:26 PM

## 2013-10-21 NOTE — Procedures (Signed)
CT aspiration times two Perisplenic - serous 15 cc Pelvic - cloudy bloody 12 cc No comp

## 2013-10-21 NOTE — Procedures (Addendum)
Small left pleural effusion identified Successful US guided left thoracentesis. Yielded 254mL of hazy dark yellow fluid. Pt tolerated procedure well. No immediate complications.  Specimen was sent for labs. CXR ordered.  Ascencion Dike PA-C 10/21/2013 3:49 PM

## 2013-10-21 NOTE — Progress Notes (Signed)
[facility] PROGRESS NOTE  Andrea Best IRC:[id number] DOB: Nov 13, 1970 DOA: 10/08/2013 PCP: Andrea Slipper, MD   Brief narrative:  43 y.o. 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 She underwent subtotal gastrectomy with porta hepatis and peripancreatic lymph node dissection with a Roux-en-Y gastrojejunostomy on 10/10/13. Hospital course was complicated due to development of HCAP for which she is on maxipime and vancomycin and now with persistent tachycardia, low grade fever and progressive leucocytosis.  Assessment/Plan: Fever/tachycardia/   leucocytosis/ HCAP  -CXR was suggestive of developing PNA in left lower lobe post op on 7/19. Patient started on Levaquin but changed to maxipime and vanco on 10/14/13. Repeat CXR 10/18/2013 still showing consolidation in the left lower lobe.  -ID consult appreciated . patient ruled out for DVT and PE. CT of chest and abd shows left pleural effusion and fluid collection inferior to spleen and free pelvic fluid.  - Perisplenic and pelvic fluid aspirated by  IR on 7/28 and sent for cx. Preliminary cx growing yeast.  Added empiric mycafungin. Also had 270 cc dark yellow fluid aspirated from left pleura and sent for analysis -continue full liquids with TNA -Blood cx so far negative. Continue current abx. HR better today. -monitor wbc and temp closely.  Gastric outlet obstruction secondary to gastric cancer  -Status post subtotal gastrectomy with porta hepatis and peripancreatic lymph node dissection with a Roux-en-Y gastrojejunostomy on 10/10/13. Was in SDU post-op. Remains stable on telemetry and now tolerating clear liquids.. Patient required NG tube post-operatively but removed on 7/25.  Dr. Benay Best saw the patient in consultation.  Plan on adjuvant chemotherapy.  -pain better controlled after dilaudid dose adjusted. Added oxycodone prn today. Advance diet as tolerated.  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 Check iron panel Hemoglobin ranges from 7-8.0 .   Protein-calorie malnutrition On TNA.    Lupus/Sjogren syndrome  Plaquenil and prednisone resumed.   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 Will need gyn follow up as outpt  DVT Prophylaxis  Resumed sq lovenox for prophylaxis as Hb has been stable around 7   Code Status: full code Family Communication: none at bedside Disposition Plan: home once clinically stable   Consultants:  ID   CCS   Dr Andrea Best  Pulmonary   IR    Procedures:  Subtotal gastrectomy with porta hepatis and peripancreatic lymph node dissection, Roux-en-Y gastrojejunostomy 10/10/13 by Dr. Excell Best.  Aspiration of perisplenic and pelvic fluid on 7/28. Left thoracentesis on 7/28  Antibiotics:  Levaquin 7/19 --> 7/21  Maxipime 7/21 -->  Vancomycin 7/21 -->   HPI/Subjective: LLQ better. S/p aspiration of pelvic and infrasplenic fluid followed by left thoracentesis today. Had serosanguinous discharge from midline incision overnight. 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  JP drain with milky discharge.BS+, non tender  Ext: warm, no edema, no calf swelling or tenderness  CNS: alert and oriented  Data Reviewed: Basic Metabolic Panel:  Recent Labs Lab 10/16/13 0530 10/17/13 0620 10/18/13 0455 10/19/13 0405 10/20/13 0500 10/21/13 0510  NA 134* 133* 134* 134* 134* 134*  K 3.6* 3.8 3.7 3.8 3.8 4.1  CL 99 98 100 100 100 98  CO2 26 24 24 22 22 23   GLUCOSE 117* 138* 116* 107* 108* 120*  BUN 7 8 9 9 9 10   CREATININE 0.38* 0.44* 0.44* 0.48* 0.47* 0.43*  CALCIUM 8.8 8.6 8.8 8.7 8.7 8.9  MG 2.0  --   --   --  1.9  --   PHOS 4.4  --   --   --  4.5  --    Liver Function Tests:  Recent Labs Lab 10/16/13 0530 10/20/13 0500  AST 29 142*  ALT 19 131*  ALKPHOS 165* 481*  BILITOT 0.3 <0.2*  PROT 7.0 7.7  ALBUMIN 1.6* 1.8*    Recent Labs Lab 10/19/13 1655 10/20/13 1214  LIPASE 121* 151*  AMYLASE 173*  --    No results found for this basename: AMMONIA,  in the last 168 hours CBC:  Recent Labs Lab 10/17/13 0620 10/18/13 0455 10/19/13 0405 10/20/13 0500 10/21/13 0510  WBC 16.1* 17.8* 18.6* 22.1* 23.7*  NEUTROABS  --   --   --  18.2*  --   HGB 7.2* 7.2* 7.2* 7.3* 7.0*  HCT 23.2* 23.6* 23.2* 23.7* 23.7*  MCV 73.4* 74.2* 73.9* 74.8* 76.7*  PLT 248 346 424* 514* 542*   Cardiac Enzymes:  Recent Labs Lab 10/16/13 2230 10/17/13 0620  TROPONINI <0.30 <0.30   BNP (last 3 results) No results found for this basename: PROBNP,  in the last 8760 hours CBG:  Recent Labs Lab 10/20/13 1154 10/20/13 1823 10/20/13 2319 10/21/13 0535 10/21/13 1155  GLUCAP 122* 157* 120* 118* 121*    Recent Results (from the past 240 hour(s))  CULTURE, BLOOD (ROUTINE X 2)     Status: None   Collection Time    10/14/13 11:50 AM      Result Value Ref Range Status   Specimen  Description BLOOD LEFT ARM   Final   Special Requests BOTTLES DRAWN AEROBIC AND ANAEROBIC [facility] EACH   Final   Culture  Setup Time     Final   Value: 10/14/2013 14:17     Performed at [facility]   Culture     Final   Value: NO GROWTH 5 DAYS     Performed at [facility]   Report Status 10/20/2013 FINAL   Final  CULTURE, BLOOD (ROUTINE X 2)     Status: None   Collection Time    10/14/13 11:53 AM      Result Value Ref Range Status   Specimen Description BLOOD LEFT HAND   Final   Special Requests BOTTLES DRAWN AEROBIC AND ANAEROBIC [facility] EACH   Final   Culture  Setup Time     Final   Value: 10/14/2013 14:17  Performed at [facility]     Final   Value: NO GROWTH 5 DAYS     Performed at [facility]   Report Status 10/20/2013 FINAL   Final  URINE CULTURE     Status: None   Collection Time    10/14/13 12:09 PM      Result Value Ref Range Status   Specimen Description URINE, CATHETERIZED   Final   Special Requests NONE   Final   Culture  Setup Time     Final   Value: 10/14/2013 17:48     Performed at [place]     Final   Value: NO GROWTH     Performed at [facility]   Culture     Final   Value: NO GROWTH     Performed at [facility]   Report Status 10/15/2013 FINAL   Final  CULTURE, EXPECTORATED SPUTUM-ASSESSMENT     Status: None   Collection Time    10/14/13  2:59 PM      Result Value Ref Range Status   Specimen Description SPUTUM   Final   Special Requests NONE   Final   Sputum evaluation     Final   Value: THIS SPECIMEN IS ACCEPTABLE. RESPIRATORY CULTURE REPORT TO FOLLOW.   Report Status 10/14/2013 FINAL   Final  CULTURE, RESPIRATORY (NON-EXPECTORATED)     Status: None   Collection Time    10/14/13  2:59 PM      Result Value Ref Range Status   Specimen Description SPUTUM   Final   Special Requests NONE   Final   Gram Stain     Final   Value: RARE WBC PRESENT, PREDOMINANTLY  MONONUCLEAR     FEW SQUAMOUS EPITHELIAL CELLS PRESENT     MODERATE GRAM POSITIVE COCCI IN PAIRS     IN CLUSTERS FEW GRAM POSITIVE RODS     RARE GRAM NEGATIVE RODS   Culture     Final   Value: NORMAL OROPHARYNGEAL FLORA     Performed at [facility]   Report Status 10/16/2013 FINAL   Final  CULTURE, BLOOD (ROUTINE X 2)     Status: None   Collection Time    10/19/13  4:55 PM      Result Value Ref Range Status   Specimen Description BLOOD LEFT ARM   Final   Special Requests     Final   Value: BOTTLES DRAWN AEROBIC AND ANAEROBIC 10CC BOTH BOTTLES   Culture  Setup Time     Final   Value: 10/19/2013 22:40     Performed at [facility]   Culture     Final   Value:        BLOOD CULTURE RECEIVED NO GROWTH TO DATE CULTURE WILL BE HELD FOR 5 DAYS BEFORE ISSUING A FINAL NEGATIVE REPORT     Performed at [facility]   Report Status PENDING   Incomplete  BODY FLUID CULTURE     Status: None   Collection Time    10/20/13  5:28 PM      Result Value Ref Range Status   Specimen Description OTHER   Final   Special Requests Normal JP DRAIN IN RIGHT ABDOMEN   Final   Gram Stain     Final   Value: MODERATE WBC PRESENT,BOTH PMN AND MONONUCLEAR     YEAST     Performed at [facility]   Culture PENDING   Incomplete   Report Status PENDING   Incomplete     Studies: Ct Angio Chest Pe W/cm &/or Wo Cm  10/20/2013   CLINICAL DATA:  History of malignant gastric tumor removal now with mid abdominal pain and tachycardia ; history of lupus and Sjogren's disease  EXAM: CT ANGIOGRAPHY CHEST  CT ABDOMEN AND PELVIS WITH CONTRAST  TECHNIQUE: Multidetector CT imaging of the chest was performed using the standard protocol during bolus administration of intravenous contrast. Multiplanar CT image reconstructions and MIPs were obtained to evaluate the vascular anatomy. Multidetector CT imaging of the abdomen and pelvis was performed using the standard protocol during bolus administration of  intravenous contrast.  CONTRAST:  117mL OMNIPAQUE IOHEXOL 350 MG/ML SOLN, 59mL OMNIPAQUE IOHEXOL 300 MG/ML SOLN  COMPARISON:  Portable chest x-ray of today's date CT scan of the chest and abdomen and pelvis of October 03, 2013  FINDINGS: CTA CHEST FINDINGS  Contrast within the pulmonary arterial tree is normal. The caliber of the thoracic aorta is normal. There is no false lumen. There is a new moderate size left pleural effusion layering posteriorly. There is atelectasis of much of the left lower lobe and atelectasis of the posterior inferior aspect of the right lower lobe. The cardiac chambers are top-normal in size. There is no pericardial effusion. There is a precarinal lymph node which measures 9 mm in short axis. There is no sub carinal, AP window, or axillary lymphadenopathy. There is a small hiatal hernia.  The observed portions of the bony thorax are normal.  CT ABDOMEN and PELVIS FINDINGS  The patient has undergone partial resection of the left hepatic lobe. There is a small amount of fluid at the surgical site. There is a drainage catheter in place which enters the abdominal cavity tip to the right of midline. The tip comes to lie superior to the spleen adjacent to the medial aspect of the remaining portion of the stomach. There are a few normal sized perigastric lymph nodes. There is no bulky lymphadenopathy.  There is minimal intrahepatic ductal dilation. The gallbladder is normal. The pancreas exhibits no inflammatory change or focal mass. There is a thin walled, elliptical fluid collection inferior to the tip of the spleen which measures 8.3 cm AP x 2.5 cm transversely x 4.2 cm longitudinally with HU value of +14. The kidneys and adrenal glands are normal. The periaortic and pericaval regions are normal.  Contrast has traversed the stomach and reached as far distally as the proximal transverse colon. There is no ileus, obstruction, or acute inflammation. There is small amount of free pelvic fluid. The  uterus exhibits heterogeneous density and there is likely a fibroid in the fundus measuring 3.2 cm in diameter. There is a tiny amount of gas within the partially distended urinary bladder. There is no inguinal hernia. There is a small amount of fluid within the midline incision.  The lumbar spine and bony pelvis are unremarkable.  Review of the MIP images confirms the above findings.  IMPRESSION: 1. There is no acute pulmonary embolism nor acute thoracic aortic pathology. 2. Left lower lobe atelectasis and/or pneumonia with moderate-sized left pleural effusion new since July 10th. There mild posterior right lower lobe atelectasis. 3. There are surgical changes from the previous partial left gastrectomy. A drainage catheter is in place in the gastric band and no abnormal fluid collection is seen here. There is a fluid collection inferior to the spleen compatible with a seroma or resolved  hematoma. There is a small amount of free pelvic fluid. 4. Postsurgical changes in the left hepatic lobe are noted. A small amount of fluid is present at the surgical site but no abscess is demonstrated. 5. There is no acute bowel abnormality nor acute hepatobiliary abnormality. There is a small amount of fluid in the midline incision.   Electronically Signed   By: David  Martinique   On: 10/20/2013 13:15   Ct Abdomen Pelvis W Contrast  10/20/2013   CLINICAL DATA:  Status post subtotal gastrectomy.  EXAM: CT CHEST, ABDOMEN, AND PELVIS WITH CONTRAST  TECHNIQUE: Multidetector CT imaging of the chest, abdomen and pelvis was performed following the standard protocol during bolus administration of intravenous contrast.  CONTRAST:  159mL OMNIPAQUE IOHEXOL 350 MG/ML SOLN, 66mL OMNIPAQUE IOHEXOL 300 MG/ML SOLN  COMPARISON:  CT scan of October 03, 2013.  FINDINGS: CT CHEST FINDINGS  No pneumothorax is noted. Large left pleural effusion is noted with atelectasis of the left lower lobe. Mild right posterior basilar subsegmental atelectasis is  noted. Right-sided PICC line is noted with distal tip at the cavoatrial junction. There is no evidence of pulmonary embolus. Thoracic aorta and great vessels appear normal without evidence of dissection or aneurysm. Bilateral axillary adenopathy is noted which is unchanged compared to prior exam, with the largest measuring 21 x 11 mm on the right side. Precarinal lymph node measuring 12 x 9 mm is noted which most likely is benign.  CT ABDOMEN AND PELVIS FINDINGS  No gallstones are noted. Patient is status post partial gastrectomy with Roux en Y procedure. The spleen and pancreas appear normal. The low density seen in the left hepatic lobe on prior exam is significantly improved compared to prior exam and is most consistent with focal fatty infiltration as opposed to neoplasm. No other abnormality seen in hepatic parenchyma. Adrenal glands and kidneys appear normal. No hydronephrosis or renal obstruction is in noted. There is noted a lenticular shaped fluid collection measuring 8.5 x 2.8 cm inferior to the spleen. Surgical drain remains within the epigastric region. Expected amount of postoperative fluid is seen in the epigastric region anteriorly. Minimal amount of fluid is noted in the right pericolic gutter. There is no evidence of bowel obstruction. Urinary bladder appears normal. Continued presence of probable fibroid is noted and uterine fundus. Interval development of fluid collection with enhancing margins is noted in the presacral space of the pelvis measuring 3.9 x 3.1 cm. Anterior midline surgical incision is noted with a small amount of associated fluid.  IMPRESSION: No evidence of pulmonary embolus.  Large left pleural effusion is noted with associated atelectasis of the left lower lobe.  Stable bilateral axillary and inguinal adenopathy is noted which which may be reactive, but metastatic disease cannot be excluded. PET scan may be performed further evaluation.  Status post partial gastrectomy with  expected postoperative changes. Surgical drain remains in the epigastric region.  Abnormality noted in left hepatic lobe on prior exam is significantly improved to most likely represents focal fatty infiltration.  Stable uterine fibroid.  There is noted 8.5 x 2.8 cm lenticular shaped fluid collection inferior to the spleen concerning for possible abscess or seroma. Also noted is 3.9 x 3.1 cm fluid collection seen in the presacral space of the posterior pelvis which may represent abscess is well. Critical Value/emergent results were called by telephone at the time of interpretation on 10/20/2013 at 1:37 pm to [facility] , who verbally acknowledged these results.   Electronically Signed  By: Sabino Dick M.D.   On: 10/20/2013 13:39   Ct Aspiration  10/21/2013   CLINICAL DATA:  Abdominal and pelvic abscesses  EXAM: CT GUIDED ASPIRATION BIOPSY OF ABDOMINAL AND PELVIC ABSCESSES  ANESTHESIA/SEDATION: Four  Mg IV Versed; 100 mcg IV Fentanyl  Total Moderate Sedation Time: 30 minutes.  PROCEDURE: The procedure risks, benefits, and alternatives were explained to the patient. Questions regarding the procedure were encouraged and answered. The patient understands and consents to the procedure.  The abdomen was prepped with Betadinein a sterile fashion, and a sterile drape was applied covering the operative field. A sterile gown and sterile gloves were used for the procedure. Local anesthesia was provided with 1% Lidocaine.  Under CT guidance, an 18 gauge needle was advanced into the perisplenic fluid collection. 15 cc serous fluid was aspirated and sent for culture.  In the prone position, the right gluteal region was prepped and draped in a sterile fashion. Under CT guidance, a second 18 gauge needle was advanced into the pelvic fluid collection via right trans gluteal approach. 12 cc cloudy reddish fluid was aspirated. Samples were sent for culture.  Imaging with documentation was performed.  Complications: None.   FINDINGS: The series of images demonstrate needle placement into the perisplenic and pelvic fluid collections. Post aspiration imaging demonstrates near resolution of the fluid collections.  IMPRESSION: Successful aspiration of 2 fluid collections as described above. The perisplenic aspiration yielded serous fluid in the pelvic aspiration yielded purulent bloody fluid.   Electronically Signed   By: Maryclare Bean M.D.   On: 10/21/2013 12:26   Ct Aspiration  10/21/2013   CLINICAL DATA:  Abdominal and pelvic abscesses  EXAM: CT GUIDED ASPIRATION BIOPSY OF ABDOMINAL AND PELVIC ABSCESSES  ANESTHESIA/SEDATION: Four  Mg IV Versed; 100 mcg IV Fentanyl  Total Moderate Sedation Time: 30 minutes.  PROCEDURE: The procedure risks, benefits, and alternatives were explained to the patient. Questions regarding the procedure were encouraged and answered. The patient understands and consents to the procedure.  The abdomen was prepped with Betadinein a sterile fashion, and a sterile drape was applied covering the operative field. A sterile gown and sterile gloves were used for the procedure. Local anesthesia was provided with 1% Lidocaine.  Under CT guidance, an 18 gauge needle was advanced into the perisplenic fluid collection. 15 cc serous fluid was aspirated and sent for culture.  In the prone position, the right gluteal region was prepped and draped in a sterile fashion. Under CT guidance, a second 18 gauge needle was advanced into the pelvic fluid collection via right trans gluteal approach. 12 cc cloudy reddish fluid was aspirated. Samples were sent for culture.  Imaging with documentation was performed.  Complications: None.  FINDINGS: The series of images demonstrate needle placement into the perisplenic and pelvic fluid collections. Post aspiration imaging demonstrates near resolution of the fluid collections.  IMPRESSION: Successful aspiration of 2 fluid collections as described above. The perisplenic aspiration yielded  serous fluid in the pelvic aspiration yielded purulent bloody fluid.   Electronically Signed   By: Maryclare Bean M.D.   On: 10/21/2013 12:26    Scheduled Meds: . antiseptic oral rinse  15 mL Mouth Rinse q12n4p  . chlorhexidine  15 mL Mouth Rinse BID  . fluconazole (DIFLUCAN) IV  200 mg Intravenous Q24H  . hydroxychloroquine  200 mg Oral BID  . imipenem-cilastatin  500 mg Intravenous 4 times per day  . insulin aspart  0-9 Units Subcutaneous 4 times per day  .  methocarbamol (ROBAXIN) IV  1,000 mg Intravenous Q8H  . metoCLOPramide (REGLAN) injection  5 mg Intravenous 3 times per day  . ondansetron (ZOFRAN) IV  4 mg Intravenous 4 times per day  . pantoprazole (PROTONIX) IV  40 mg Intravenous QHS  . predniSONE  5 mg Oral BID WC  . sodium chloride  10-40 mL Intracatheter Q12H  . vancomycin  1,000 mg Intravenous Q8H   Continuous Infusions: . sodium chloride 30 mL/hr at 10/17/13 0514  . Marland KitchenTPN (CLINIMIX-E) Adult 80 mL/hr at 10/20/13 1758   And  . fat emulsion 250 mL (10/20/13 1757)  . Marland KitchenTPN (CLINIMIX-E) Adult     And  . fat emulsion        Time spent: 25 minutes    Keni Elison  [facility] Pager [phone] If 7PM-7AM, please contact night-coverage at [email or web address], password [facility] 10/21/2013, 2:55 PM  LOS: 13 days

## 2013-10-21 NOTE — Progress Notes (Signed)
IP PROGRESS NOTE  Subjective:   She underwent a subtotal gastrectomy and lymph node dissection with creation of a gastrojejunostomy on 10/10/2013. There was no evidence of distant metastatic disease noted at the time of surgery. No evidence of liver metastases. 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 Tumor involved the serosal surface. The resection margins were negative. 10 of 17 lymph nodes were positive for metastatic adenocarcinoma.  She is recovering from surgery. She has been diagnosed with pneumonia and  possible abdominal/pelvic abscesses. She is scheduled for a drainage procedure in interventional radiology today.  Ms. Uffelman reports resolution of the nausea and belching.  Objective: Vital signs in last 24 hours: Blood pressure 100/57, pulse 97, temperature 98 F (36.7 C), temperature source Oral, resp. rate 20, height 5\' 6"  (1.676 m), weight 157 lb 3.2 oz (71.305 kg), last menstrual period 10/11/2013, SpO2 100.00%.  Intake/Output from previous day: 07/27 0701 - 07/28 0700 In: 3995.2 [P.O.:480; I.V.:510; IV Piggyback:920; TPN:2085.2] Out: 20 [Drains:20]  Physical Exam:  Lungs: Decreased breath sounds at the lower chest bilaterally, no respiratory distress Cardiac: Regular rate and rhythm Abdomen: Midline incision with staples in place.. Let drainage on the covering gauze, right upper quadrant drain Extremities: No leg edema  Portacath/PICC-without erythema  Lab Results:  Recent Labs  10/20/13 0500 10/21/13 0510  WBC 22.1* 23.7*  HGB 7.3* 7.0*  HCT 23.7* 23.7*  PLT 514* 542*    BMET  Recent Labs  10/20/13 0500 10/21/13 0510  NA 134* 134*  K 3.8 4.1  CL 100 98  CO2 22 23  GLUCOSE 108* 120*  BUN 9 10  CREATININE 0.47* 0.43*  CALCIUM 8.7 8.9    Studies/Results: Ct Angio Chest Pe W/cm &/or Wo Cm  10/20/2013   CLINICAL DATA:  History of malignant gastric tumor removal now with mid abdominal pain and  tachycardia ; history of lupus and Sjogren's disease  EXAM: CT ANGIOGRAPHY CHEST  CT ABDOMEN AND PELVIS WITH CONTRAST  TECHNIQUE: Multidetector CT imaging of the chest was performed using the standard protocol during bolus administration of intravenous contrast. Multiplanar CT image reconstructions and MIPs were obtained to evaluate the vascular anatomy. Multidetector CT imaging of the abdomen and pelvis was performed using the standard protocol during bolus administration of intravenous contrast.  CONTRAST:  130mL OMNIPAQUE IOHEXOL 350 MG/ML SOLN, 71mL OMNIPAQUE IOHEXOL 300 MG/ML SOLN  COMPARISON:  Portable chest x-ray of today's date CT scan of the chest and abdomen and pelvis of October 03, 2013  FINDINGS: CTA CHEST FINDINGS  Contrast within the pulmonary arterial tree is normal. The caliber of the thoracic aorta is normal. There is no false lumen. There is a new moderate size left pleural effusion layering posteriorly. There is atelectasis of much of the left lower lobe and atelectasis of the posterior inferior aspect of the right lower lobe. The cardiac chambers are top-normal in size. There is no pericardial effusion. There is a precarinal lymph node which measures 9 mm in short axis. There is no sub carinal, AP window, or axillary lymphadenopathy. There is a small hiatal hernia.  The observed portions of the bony thorax are normal.  CT ABDOMEN and PELVIS FINDINGS  The patient has undergone partial resection of the left hepatic lobe. There is a small amount of fluid at the surgical site. There is a drainage catheter in place which enters the abdominal cavity tip to the right of midline. The tip comes to lie superior to the spleen adjacent to the  medial aspect of the remaining portion of the stomach. There are a few normal sized perigastric lymph nodes. There is no bulky lymphadenopathy.  There is minimal intrahepatic ductal dilation. The gallbladder is normal. The pancreas exhibits no inflammatory change or focal  mass. There is a thin walled, elliptical fluid collection inferior to the tip of the spleen which measures 8.3 cm AP x 2.5 cm transversely x 4.2 cm longitudinally with HU value of +14. The kidneys and adrenal glands are normal. The periaortic and pericaval regions are normal.  Contrast has traversed the stomach and reached as far distally as the proximal transverse colon. There is no ileus, obstruction, or acute inflammation. There is small amount of free pelvic fluid. The uterus exhibits heterogeneous density and there is likely a fibroid in the fundus measuring 3.2 cm in diameter. There is a tiny amount of gas within the partially distended urinary bladder. There is no inguinal hernia. There is a small amount of fluid within the midline incision.  The lumbar spine and bony pelvis are unremarkable.  Review of the MIP images confirms the above findings.  IMPRESSION: 1. There is no acute pulmonary embolism nor acute thoracic aortic pathology. 2. Left lower lobe atelectasis and/or pneumonia with moderate-sized left pleural effusion new since July 10th. There mild posterior right lower lobe atelectasis. 3. There are surgical changes from the previous partial left gastrectomy. A drainage catheter is in place in the gastric band and no abnormal fluid collection is seen here. There is a fluid collection inferior to the spleen compatible with a seroma or resolved hematoma. There is a small amount of free pelvic fluid. 4. Postsurgical changes in the left hepatic lobe are noted. A small amount of fluid is present at the surgical site but no abscess is demonstrated. 5. There is no acute bowel abnormality nor acute hepatobiliary abnormality. There is a small amount of fluid in the midline incision.   Electronically Signed   By: David  Martinique   On: 10/20/2013 13:15   Ct Abdomen Pelvis W Contrast  10/20/2013   CLINICAL DATA:  Status post subtotal gastrectomy.  EXAM: CT CHEST, ABDOMEN, AND PELVIS WITH CONTRAST  TECHNIQUE:  Multidetector CT imaging of the chest, abdomen and pelvis was performed following the standard protocol during bolus administration of intravenous contrast.  CONTRAST:  191mL OMNIPAQUE IOHEXOL 350 MG/ML SOLN, 45mL OMNIPAQUE IOHEXOL 300 MG/ML SOLN  COMPARISON:  CT scan of October 03, 2013.  FINDINGS: CT CHEST FINDINGS  No pneumothorax is noted. Large left pleural effusion is noted with atelectasis of the left lower lobe. Mild right posterior basilar subsegmental atelectasis is noted. Right-sided PICC line is noted with distal tip at the cavoatrial junction. There is no evidence of pulmonary embolus. Thoracic aorta and great vessels appear normal without evidence of dissection or aneurysm. Bilateral axillary adenopathy is noted which is unchanged compared to prior exam, with the largest measuring 21 x 11 mm on the right side. Precarinal lymph node measuring 12 x 9 mm is noted which most likely is benign.  CT ABDOMEN AND PELVIS FINDINGS  No gallstones are noted. Patient is status post partial gastrectomy with Roux en Y procedure. The spleen and pancreas appear normal. The low density seen in the left hepatic lobe on prior exam is significantly improved compared to prior exam and is most consistent with focal fatty infiltration as opposed to neoplasm. No other abnormality seen in hepatic parenchyma. Adrenal glands and kidneys appear normal. No hydronephrosis or renal obstruction is in  noted. There is noted a lenticular shaped fluid collection measuring 8.5 x 2.8 cm inferior to the spleen. Surgical drain remains within the epigastric region. Expected amount of postoperative fluid is seen in the epigastric region anteriorly. Minimal amount of fluid is noted in the right pericolic gutter. There is no evidence of bowel obstruction. Urinary bladder appears normal. Continued presence of probable fibroid is noted and uterine fundus. Interval development of fluid collection with enhancing margins is noted in the presacral space of  the pelvis measuring 3.9 x 3.1 cm. Anterior midline surgical incision is noted with a small amount of associated fluid.  IMPRESSION: No evidence of pulmonary embolus.  Large left pleural effusion is noted with associated atelectasis of the left lower lobe.  Stable bilateral axillary and inguinal adenopathy is noted which which may be reactive, but metastatic disease cannot be excluded. PET scan may be performed further evaluation.  Status post partial gastrectomy with expected postoperative changes. Surgical drain remains in the epigastric region.  Abnormality noted in left hepatic lobe on prior exam is significantly improved to most likely represents focal fatty infiltration.  Stable uterine fibroid.  There is noted 8.5 x 2.8 cm lenticular shaped fluid collection inferior to the spleen concerning for possible abscess or seroma. Also noted is 3.9 x 3.1 cm fluid collection seen in the presacral space of the posterior pelvis which may represent abscess is well. Critical Value/emergent results were called by telephone at the time of interpretation on 10/20/2013 at 1:37 pm to [facility] , who verbally acknowledged these results.   Electronically Signed   By: Sabino Dick M.D.   On: 10/20/2013 13:39    Medications: I have reviewed the patient's current medications.  Assessment/Plan:  1. Gastric cancer status post upper endoscopy 09/22/2013 with findings of a partially obstructing oozing cratered gastric ulcer in the gastric antrum.  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  CT scans chest/abdomen/pelvis 10/03/2013 showed a 7 x 5 mm groundglass opacity in the superior segment of the right lower lobe; a 4.5 mm nodular density in the right upper lobe; bilateral axillary adenopathy; low density area anteriorly in the left hepatic lobe most consistent with fatty infiltration; another hypodense area within the medial segment of the left hepatic lobe; severe gastric distention;  mild bilateral inguinal adenopathy. 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 Gastric outlet obstruction secondary to #1. 3. Nausea/vomiting secondary to #2. Resolved. 4. Abdominal pain secondary to #1 and #2. 5. Weight loss secondary to #1 and #2. 6. Microcytic anemia, likely iron deficiency, she received iron dextran 10/11/2013 7. Lupus/Sjgren's. 8. Pneumonia 9. Abdominal/pelvic fluid collections -seroma versus abscesses, scheduled for IR drainage today   Ms. Stratton is recovering from surgery. I discussed the surgical pathology with Ms. Maynes and her mother. She understands the high chance of developing recurrent gastric cancer. We reviewed the indication for adjuvant therapy. I recommend adjuvant chemotherapy and radiation. I will make a radiation oncology referral. We will start with adjuvant chemotherapy to begin when she has recovered from surgery.  My initial recommendation is to deliver adjuvant weekly 5-FU/leucovorin according to the "[place] "regimen for 2 months to be followed by concurrent capecitabine and radiation. We discussed the potential toxicities associated with 5-fluorouracil including a chance for nausea, mucositis, diarrhea, alopecia, and hematologic toxicity. We discussed the hyperpigmentation  and hand/foot syndrome associated with 5-fluorouracil. Ms. Bembenek will attend a chemotherapy teaching class.  Recommendations:  1. Limit blood draws as possible 2. Begin oral iron 3. outpatient followup will be scheduled at the [facility]      LOS: 13 days   [place]  10/21/2013, 8:50 AM

## 2013-10-21 NOTE — Progress Notes (Signed)
[facility] Progress Note  11 Days Post-Op  Subjective: Pt doing well, just got back from IR who aspirated her pelvic and sub-splenic.  Pt breathing better, IS up to 1250.  No N/V, hungry.  Abdominal pain improving, but midline wound started draining clear/bloody fluid.  Ambulating OOB.  Having BM's and flatus, pain better controlled.    Objective: Vital signs in last 24 hours: Temp:  [98 F (36.7 C)-99.4 F (37.4 C)] 98 F (36.7 C) (07/28 0535) Pulse Rate:  [97-168] 111 (07/28 1013) Resp:  [12-27] 12 (07/28 1013) BP: (100-121)/(52-79) 110/72 mmHg (07/28 1013) SpO2:  [97 %-100 %] 100 % (07/28 1013) Last BM Date: 10/21/13  Intake/Output from previous day: 07/27 0701 - 07/28 0700 In: 3995.2 [P.O.:480; I.V.:510; IV Piggyback:920; TPN:2085.2] Out: 20 [Drains:20] Intake/Output this shift:    PE: Gen:  Alert, NAD, pleasant Pulm:  IS at 1250 Abd: Soft, mild tenderness, ND, +BS, no HSM, midline incisions with staples in place, in the middle of the incision site 3 staples were removed and packed with wet gauze with dry dressing, no foul smell or purulent drainage was noted (only serosanguinous), drain with whitish tan cloudy purulent drainage, approximately 44mL drainage in JP drain   Lab Results:   Recent Labs  10/20/13 0500 10/21/13 0510  WBC 22.1* 23.7*  HGB 7.3* 7.0*  HCT 23.7* 23.7*  PLT 514* 542*   BMET  Recent Labs  10/20/13 0500 10/21/13 0510  NA 134* 134*  K 3.8 4.1  CL 100 98  CO2 22 23  GLUCOSE 108* 120*  BUN 9 10  CREATININE 0.47* 0.43*  CALCIUM 8.7 8.9   PT/INR  Recent Labs  10/21/13 0510  LABPROT 14.2  INR 1.10   CMP     Component Value Date/Time   NA 134* 10/21/2013 0510   K 4.1 10/21/2013 0510   CL 98 10/21/2013 0510   CO2 23 10/21/2013 0510   GLUCOSE 120* 10/21/2013 0510   BUN 10 10/21/2013 0510   CREATININE 0.43* 10/21/2013 0510   CREATININE 0.63 05/26/2013 1517   CALCIUM 8.9 10/21/2013 0510   PROT 7.7 10/20/2013 0500   ALBUMIN  1.8* 10/20/2013 0500   AST 142* 10/20/2013 0500   ALT 131* 10/20/2013 0500   ALKPHOS 481* 10/20/2013 0500   BILITOT <0.2* 10/20/2013 0500   GFRNONAA >90 10/21/2013 0510   GFRAA >90 10/21/2013 0510   Lipase     Component Value Date/Time   LIPASE 151* 10/20/2013 1214       Studies/Results: Ct Angio Chest Pe W/cm &/or Wo Cm  10/20/2013   CLINICAL DATA:  History of malignant gastric tumor removal now with mid abdominal pain and tachycardia ; history of lupus and Sjogren's disease  EXAM: CT ANGIOGRAPHY CHEST  CT ABDOMEN AND PELVIS WITH CONTRAST  TECHNIQUE: Multidetector CT imaging of the chest was performed using the standard protocol during bolus administration of intravenous contrast. Multiplanar CT image reconstructions and MIPs were obtained to evaluate the vascular anatomy. Multidetector CT imaging of the abdomen and pelvis was performed using the standard protocol during bolus administration of intravenous contrast.  CONTRAST:  1103mL OMNIPAQUE IOHEXOL 350 MG/ML SOLN, 26mL OMNIPAQUE IOHEXOL 300 MG/ML SOLN  COMPARISON:  Portable chest x-ray of today's date CT scan of the chest and abdomen and pelvis of October 03, 2013  FINDINGS: CTA CHEST FINDINGS  Contrast within the pulmonary arterial tree is normal. The caliber of the thoracic aorta is normal. There is no false lumen. There is a new  moderate size left pleural effusion layering posteriorly. There is atelectasis of much of the left lower lobe and atelectasis of the posterior inferior aspect of the right lower lobe. The cardiac chambers are top-normal in size. There is no pericardial effusion. There is a precarinal lymph node which measures 9 mm in short axis. There is no sub carinal, AP window, or axillary lymphadenopathy. There is a small hiatal hernia.  The observed portions of the bony thorax are normal.  CT ABDOMEN and PELVIS FINDINGS  The patient has undergone partial resection of the left hepatic lobe. There is a small amount of fluid at the surgical  site. There is a drainage catheter in place which enters the abdominal cavity tip to the right of midline. The tip comes to lie superior to the spleen adjacent to the medial aspect of the remaining portion of the stomach. There are a few normal sized perigastric lymph nodes. There is no bulky lymphadenopathy.  There is minimal intrahepatic ductal dilation. The gallbladder is normal. The pancreas exhibits no inflammatory change or focal mass. There is a thin walled, elliptical fluid collection inferior to the tip of the spleen which measures 8.3 cm AP x 2.5 cm transversely x 4.2 cm longitudinally with HU value of +14. The kidneys and adrenal glands are normal. The periaortic and pericaval regions are normal.  Contrast has traversed the stomach and reached as far distally as the proximal transverse colon. There is no ileus, obstruction, or acute inflammation. There is small amount of free pelvic fluid. The uterus exhibits heterogeneous density and there is likely a fibroid in the fundus measuring 3.2 cm in diameter. There is a tiny amount of gas within the partially distended urinary bladder. There is no inguinal hernia. There is a small amount of fluid within the midline incision.  The lumbar spine and bony pelvis are unremarkable.  Review of the MIP images confirms the above findings.  IMPRESSION: 1. There is no acute pulmonary embolism nor acute thoracic aortic pathology. 2. Left lower lobe atelectasis and/or pneumonia with moderate-sized left pleural effusion new since July 10th. There mild posterior right lower lobe atelectasis. 3. There are surgical changes from the previous partial left gastrectomy. A drainage catheter is in place in the gastric band and no abnormal fluid collection is seen here. There is a fluid collection inferior to the spleen compatible with a seroma or resolved hematoma. There is a small amount of free pelvic fluid. 4. Postsurgical changes in the left hepatic lobe are noted. A small amount  of fluid is present at the surgical site but no abscess is demonstrated. 5. There is no acute bowel abnormality nor acute hepatobiliary abnormality. There is a small amount of fluid in the midline incision.   Electronically Signed   By: David  Martinique   On: 10/20/2013 13:15   Ct Abdomen Pelvis W Contrast  10/20/2013   CLINICAL DATA:  Status post subtotal gastrectomy.  EXAM: CT CHEST, ABDOMEN, AND PELVIS WITH CONTRAST  TECHNIQUE: Multidetector CT imaging of the chest, abdomen and pelvis was performed following the standard protocol during bolus administration of intravenous contrast.  CONTRAST:  143mL OMNIPAQUE IOHEXOL 350 MG/ML SOLN, 46mL OMNIPAQUE IOHEXOL 300 MG/ML SOLN  COMPARISON:  CT scan of October 03, 2013.  FINDINGS: CT CHEST FINDINGS  No pneumothorax is noted. Large left pleural effusion is noted with atelectasis of the left lower lobe. Mild right posterior basilar subsegmental atelectasis is noted. Right-sided PICC line is noted with distal tip at the cavoatrial  junction. There is no evidence of pulmonary embolus. Thoracic aorta and great vessels appear normal without evidence of dissection or aneurysm. Bilateral axillary adenopathy is noted which is unchanged compared to prior exam, with the largest measuring 21 x 11 mm on the right side. Precarinal lymph node measuring 12 x 9 mm is noted which most likely is benign.  CT ABDOMEN AND PELVIS FINDINGS  No gallstones are noted. Patient is status post partial gastrectomy with Roux en Y procedure. The spleen and pancreas appear normal. The low density seen in the left hepatic lobe on prior exam is significantly improved compared to prior exam and is most consistent with focal fatty infiltration as opposed to neoplasm. No other abnormality seen in hepatic parenchyma. Adrenal glands and kidneys appear normal. No hydronephrosis or renal obstruction is in noted. There is noted a lenticular shaped fluid collection measuring 8.5 x 2.8 cm inferior to the spleen.  Surgical drain remains within the epigastric region. Expected amount of postoperative fluid is seen in the epigastric region anteriorly. Minimal amount of fluid is noted in the right pericolic gutter. There is no evidence of bowel obstruction. Urinary bladder appears normal. Continued presence of probable fibroid is noted and uterine fundus. Interval development of fluid collection with enhancing margins is noted in the presacral space of the pelvis measuring 3.9 x 3.1 cm. Anterior midline surgical incision is noted with a small amount of associated fluid.  IMPRESSION: No evidence of pulmonary embolus.  Large left pleural effusion is noted with associated atelectasis of the left lower lobe.  Stable bilateral axillary and inguinal adenopathy is noted which which may be reactive, but metastatic disease cannot be excluded. PET scan may be performed further evaluation.  Status post partial gastrectomy with expected postoperative changes. Surgical drain remains in the epigastric region.  Abnormality noted in left hepatic lobe on prior exam is significantly improved to most likely represents focal fatty infiltration.  Stable uterine fibroid.  There is noted 8.5 x 2.8 cm lenticular shaped fluid collection inferior to the spleen concerning for possible abscess or seroma. Also noted is 3.9 x 3.1 cm fluid collection seen in the presacral space of the posterior pelvis which may represent abscess is well. Critical Value/emergent results were called by telephone at the time of interpretation on 10/20/2013 at 1:37 pm to [facility] , who verbally acknowledged these results.   Electronically Signed   By: Sabino Dick M.D.   On: 10/20/2013 13:39    Anti-infectives: Anti-infectives   Start     Dose/Rate Route Frequency Ordered Stop   10/20/13 1800  imipenem-cilastatin (PRIMAXIN) 500 mg in sodium chloride 0.9 % 100 mL IVPB     500 mg 200 mL/hr over 30 Minutes Intravenous 4 times per day 10/20/13 1536     10/20/13 1000   hydroxychloroquine (PLAQUENIL) tablet 200 mg     200 mg Oral 2 times daily 10/20/13 0906     10/19/13 2200  vancomycin (VANCOCIN) IVPB 1000 mg/200 mL premix     1,000 mg 200 mL/hr over 60 Minutes Intravenous Every 8 hours 10/19/13 1402     10/17/13 2315  vancomycin (VANCOCIN) 1,250 mg in sodium chloride 0.9 % 250 mL IVPB  Status:  Discontinued     1,250 mg 166.7 mL/hr over 90 Minutes Intravenous 3 times per day 10/17/13 2303 10/19/13 1337   10/16/13 0830  vancomycin (VANCOCIN) IVPB 1000 mg/200 mL premix  Status:  Discontinued     1,000 mg 200 mL/hr over 60 Minutes Intravenous  3 times per day 10/16/13 0730 10/17/13 2303   10/14/13 2200  vancomycin (VANCOCIN) IVPB 750 mg/150 ml premix  Status:  Discontinued     750 mg 150 mL/hr over 60 Minutes Intravenous Every 8 hours 10/14/13 1158 10/16/13 0730   10/14/13 1400  ceFEPIme (MAXIPIME) 1 g in dextrose 5 % 50 mL IVPB  Status:  Discontinued     1 g 100 mL/hr over 30 Minutes Intravenous 3 times per day 10/14/13 1125 10/20/13 1536   10/14/13 1400  vancomycin (VANCOCIN) 1,500 mg in sodium chloride 0.9 % 500 mL IVPB     1,500 mg 250 mL/hr over 120 Minutes Intravenous  Once 10/14/13 1158 10/14/13 1658   10/12/13 1600  levofloxacin (LEVAQUIN) IVPB 500 mg  Status:  Discontinued     500 mg 100 mL/hr over 60 Minutes Intravenous Every 24 hours 10/12/13 1545 10/14/13 1124   10/10/13 1230  ceFAZolin (ANCEF) IVPB 2 g/50 mL premix     2 g 100 mL/hr over 30 Minutes Intravenous  Once 10/10/13 1220 10/10/13 1222   10/10/13 0600  [MAR Hold]  ceFAZolin (ANCEF) IVPB 2 g/50 mL premix     (On MAR Hold since 10/10/13 0659)   2 g 100 mL/hr over 30 Minutes Intravenous On call to O.R. 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 Clementeen Graham, but I was hesitant to resume it because of her low Hgb/Hct (7.0 today) and patients blood administration preferences, but because of lupus its a hard decision -Hgb dropped to 7.0 today, does not desire blood because she is a Jehovah's witness  -Continue TPN, NPO, IVF, pain control, antiemetics  -Work to decrease IV narcotics (PRN breakthrough only), start orals (OxyIR) -CT Abd/pelvis showed intraabdominal fluid collections s/p aspiration -Continue JP drain -Add diflucan 200mg  IV given gram stain showed yeast -Talked to Dr. Elsworth Soho for pulmonary consult - may need pulmonary effusion aspirated on left -Removed some inferior and mid wound staples and packed with WD dressing -Test JP drainage for lipase and amylase     LOS: 13 days    DORT, Torie Towle 10/21/2013, 12:02 PM Pager: [phone]

## 2013-10-21 NOTE — Progress Notes (Signed)
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 Azithromycin Rash  . Sulfa Antibiotics Rash  . Hydrocodone-Acetaminophen Nausea And Vomiting    Hallucinating   . 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 SHE DOES NOT WANT ANY BLOOD PRODUCTS OR FRACTIONS   Patient Measurements: Height: _0  (167.6 cm) Weight: 157 lb 3.2 oz (71.305 kg) IBW/kg (Calculated) : 59.3  Vital Signs: Temp: 98 F (36.7 C) (07/28 0535) Temp src: Oral (07/28 0535) BP: 100/57 mmHg (07/28 0535) Pulse Rate: 97 (07/28 0535) Intake/Output from previous day: 07/27 0701 - 07/28 0700 In: 3995.2 [P.O.:480; I.V.:510; IV Piggyback:920; TPN:2085.2] Out: 20 [Drains:20] Intake/Output from this shift:    Labs:  Recent Labs  10/19/13 0405 10/20/13 0500 10/21/13 0510  WBC 18.6* 22.1* 23.7*  HGB 7.2* 7.3* 7.0*  HCT 23.2* 23.7* 23.7*  PLT 424* 514* 542*  APTT  --   --  35  INR  --   --  1.10    Recent Labs  10/19/13 0405 10/20/13 0500 10/21/13 0510  NA 134* 134* 134*  K 3.8 3.8 4.1  CL 100 100 98  CO2 _1 GLUCOSE 107* 108* 120*  BUN _2 CREATININE 0.48* 0.47* 0.43*  CALCIUM 8.7 8.7 8.9  MG  --  1.9  --   PHOS  --  4.5  --   PROT  --  7.7  --   ALBUMIN  --  1.8*  --   AST  --  142*  --   ALT  --  131*  --   ALKPHOS  --  481*  --   BILITOT  --  <0.2*  --   PREALBUMIN  --  15.2*  --   TRIG  --  130  --    Estimated Creatinine Clearance: 94.6 ml/min (by C-G formula based on Cr of 0.43).    Recent Labs  10/20/13 1823 10/20/13 2319 10/21/13 0535  GLUCAP 157* 120* 118*   Medications:  Scheduled:  . antiseptic oral rinse  15 mL Mouth Rinse q12n4p  . chlorhexidine  15 mL Mouth Rinse BID  . fentaNYL      .  HYDROmorphone (DILAUDID) injection  2 mg Intravenous Q4H  . hydroxychloroquine  200 mg Oral BID  . imipenem-cilastatin  500 mg Intravenous 4 times per day  . insulin aspart  0-9  Units Subcutaneous 4 times per day  . methocarbamol (ROBAXIN) IV  1,000 mg Intravenous Q8H  . metoCLOPramide (REGLAN) injection  5 mg Intravenous 3 times per day  . midazolam      . ondansetron (ZOFRAN) IV  4 mg Intravenous 4 times per day  . pantoprazole (PROTONIX) IV  40 mg Intravenous QHS  . predniSONE  5 mg Oral BID WC  . sodium chloride  10-40 mL Intracatheter Q12H  . vancomycin  1,000 mg Intravenous Q8H   Infusions:  . sodium chloride 30 mL/hr at 10/17/13 0514  . Marland KitchenTPN (CLINIMIX-E) Adult 80 mL/hr at 10/20/13 1758   And  . fat emulsion 250 mL (10/20/13 1757)     Nutritional Goals:   RD recs (7/23): Kcal: 2000-2200, Protein: 85-100 gram, Fluid: >/=2100 ml/daily  Clinimix 5/20 at a goal rate of 80 ml/hr + lipids to provide: 96 g/day protein, 2169 Kcal/day.  Current nutrition:   Diet: full liquids  IVF: NS @ 30 ml/hr  Clinimix +  Lipids at goal rate for total of 90 ml/hr  CBGs & Insulin requirements past 24 hours:   CBGs: 118-157  Novolog sensitive scale q4hr - 3 units  Assessment:  38 YOF presents with N/V, weight loss d/t difficulty eating with recent dx of Gastric cancer. Partially obstructing gastric ulcer on EGD. TPN per pharmacy started 7/16. 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  Transitioned to full liquids on 7/26; NPO for IR procedure today, but planning to advance enteral nutrition this afternoon as able.   Renal / Hepatic function: SCr remains low/stable, LFTs have increased AST 142, ALT 131. Alk Phos 481. Total bili < 0.2. Continue to monitor.  Electrolytes: Na low but stable (unable to adjust in premix TNA), Corr Ca 10.8 (sl elevated), other WNL.    Pre-Albumin: 15.2 (7/27), 3.1 (7/20), 7.9 (7/17) - reflects depleted protein synthesis; improving  TG 130 (7/27), 146 (7/20), 94 (7/17), - increased, will continue to monitor  Glucose: 1 reading at 157, otherwise at goal < 150.  No hypoglycemia  TPN Access: PICC (placed  7/16)  TPN day#: 13  Plan:  At 1800 today:  Continue Clinimix E 5/20 at 80 ml/hr; 20% fat emulsion at 10 ml/hr.  Can reduce rate tomorrow based on how well patient tolerates PO nutrition  TNA to contain standard multivitamins and trace elements daily.  Continue SSI q6h  TNA lab panels on Mondays & Thursdays.  F/u daily.   Reuel Boom, PharmD [phone] 10/21/2013 9:21 AM

## 2013-10-21 NOTE — Progress Notes (Signed)
INFECTIOUS DISEASE PROGRESS NOTE  ID: Andrea Best is a 43 y.o. female with  Principal Problem:   Gastric outlet obstruction Active Problems:   Gastric cancer   Anemia, iron deficiency   Protein-calorie malnutrition, severe   Hypomagnesemia   Hyperphosphatemia   Fever, unspecified   Tachycardia   Leucocytosis   HCAP (healthcare-associated pneumonia)  Subjective: Feels better post thoracentesis  Abtx:  Anti-infectives   Start     Dose/Rate Route Frequency Ordered Stop   10/21/13 1400  fluconazole (DIFLUCAN) IVPB 200 mg     200 mg 100 mL/hr over 60 Minutes Intravenous Every 24 hours 10/21/13 1207     10/20/13 1800  imipenem-cilastatin (PRIMAXIN) 500 mg in sodium chloride 0.9 % 100 mL IVPB     500 mg 200 mL/hr over 30 Minutes Intravenous 4 times per day 10/20/13 1536     10/20/13 1000  hydroxychloroquine (PLAQUENIL) tablet 200 mg     200 mg Oral 2 times daily 10/20/13 0906     10/19/13 2200  vancomycin (VANCOCIN) IVPB 1000 mg/200 mL premix     1,000 mg 200 mL/hr over 60 Minutes Intravenous Every 8 hours 10/19/13 1402     10/17/13 2315  vancomycin (VANCOCIN) 1,250 mg in sodium chloride 0.9 % 250 mL IVPB  Status:  Discontinued     1,250 mg 166.7 mL/hr over 90 Minutes Intravenous 3 times per day 10/17/13 2303 10/19/13 1337   10/16/13 0830  vancomycin (VANCOCIN) IVPB 1000 mg/200 mL premix  Status:  Discontinued     1,000 mg 200 mL/hr over 60 Minutes Intravenous 3 times per day 10/16/13 0730 10/17/13 2303   10/14/13 2200  vancomycin (VANCOCIN) IVPB 750 mg/150 ml premix  Status:  Discontinued     750 mg 150 mL/hr over 60 Minutes Intravenous Every 8 hours 10/14/13 1158 10/16/13 0730   10/14/13 1400  ceFEPIme (MAXIPIME) 1 g in dextrose 5 % 50 mL IVPB  Status:  Discontinued     1 g 100 mL/hr over 30 Minutes Intravenous 3 times per day 10/14/13 1125 10/20/13 1536   10/14/13 1400  vancomycin (VANCOCIN) 1,500 mg in sodium chloride 0.9 % 500 mL IVPB     1,500 mg 250 mL/hr  over 120 Minutes Intravenous  Once 10/14/13 1158 10/14/13 1658   10/12/13 1600  levofloxacin (LEVAQUIN) IVPB 500 mg  Status:  Discontinued     500 mg 100 mL/hr over 60 Minutes Intravenous Every 24 hours 10/12/13 1545 10/14/13 1124   10/10/13 1230  ceFAZolin (ANCEF) IVPB 2 g/50 mL premix     2 g 100 mL/hr over 30 Minutes Intravenous  Once 10/10/13 1220 10/10/13 1222   10/10/13 0600  [MAR Hold]  ceFAZolin (ANCEF) IVPB 2 g/50 mL premix     (On MAR Hold since 10/10/13 0659)   2 g 100 mL/hr over 30 Minutes Intravenous On call to O.R. 10/09/13 1244 10/10/13 0743      Medications:  Scheduled: . antiseptic oral rinse  15 mL Mouth Rinse q12n4p  . chlorhexidine  15 mL Mouth Rinse BID  . hydroxychloroquine  200 mg Oral BID  . imipenem-cilastatin  500 mg Intravenous 4 times per day  . insulin aspart  0-9 Units Subcutaneous 4 times per day  . methocarbamol (ROBAXIN) IV  1,000 mg Intravenous Q8H  . metoCLOPramide (REGLAN) injection  5 mg Intravenous 3 times per day  . micafungin [facility]) IV  100 mg Intravenous Daily  . ondansetron (ZOFRAN) IV  4 mg Intravenous 4 times  per day  . pantoprazole (PROTONIX) IV  40 mg Intravenous QHS  . predniSONE  5 mg Oral BID WC  . sodium chloride  10-40 mL Intracatheter Q12H  . vancomycin  1,000 mg Intravenous Q8H    Objective: Vital signs in last 24 hours: Temp:  [98 F (36.7 C)-98.6 F (37 C)] 98 F (36.7 C) (07/28 0535) Pulse Rate:  [97-145] 111 (07/28 1013) Resp:  [12-27] 12 (07/28 1013) BP: (100-121)/(52-79) 110/72 mmHg (07/28 1013) SpO2:  [97 %-100 %] 100 % (07/28 1013)   General appearance: alert, cooperative and no distress Resp: clear to auscultation bilaterally Cardio: regular rate and rhythm GI: normal findings: soft, non-tender and abnormal findings:  hypoactive bowel sounds and drain with cloudy fluid.   Lab Results  Recent Labs  10/20/13 0500 10/21/13 0510  WBC 22.1* 23.7*  HGB 7.3* 7.0*  HCT 23.7* 23.7*  NA 134* 134*  K 3.8  4.1  CL 100 98  CO2 22 23  BUN 9 10  CREATININE 0.47* 0.43*   Liver Panel  Recent Labs  10/20/13 0500  PROT 7.7  ALBUMIN 1.8*  AST 142*  ALT 131*  ALKPHOS 481*  BILITOT <0.2*   Sedimentation Rate No results found for this basename: ESRSEDRATE,  in the last 72 hours C-Reactive Protein No results found for this basename: CRP,  in the last 72 hours  Microbiology: Recent Results (from the past 240 hour(s))  CULTURE, BLOOD (ROUTINE X 2)     Status: None   Collection Time    10/14/13 11:50 AM      Result Value Ref Range Status   Specimen Description BLOOD LEFT ARM   Final   Special Requests BOTTLES DRAWN AEROBIC AND ANAEROBIC [facility] EACH   Final   Culture  Setup Time     Final   Value: 10/14/2013 14:17     Performed at [facility]   Culture     Final   Value: NO GROWTH 5 DAYS     Performed at [facility]   Report Status 10/20/2013 FINAL   Final  CULTURE, BLOOD (ROUTINE X 2)     Status: None   Collection Time    10/14/13 11:53 AM      Result Value Ref Range Status   Specimen Description BLOOD LEFT HAND   Final   Special Requests BOTTLES DRAWN AEROBIC AND ANAEROBIC [facility] EACH   Final   Culture  Setup Time     Final   Value: 10/14/2013 14:17     Performed at [facility]   Culture     Final   Value: NO GROWTH 5 DAYS     Performed at [facility]   Report Status 10/20/2013 FINAL   Final  URINE CULTURE     Status: None   Collection Time    10/14/13 12:09 PM      Result Value Ref Range Status   Specimen Description URINE, CATHETERIZED   Final   Special Requests NONE   Final   Culture  Setup Time     Final   Value: 10/14/2013 17:48     Performed at [place]     Final   Value: NO GROWTH     Performed at [facility]   Culture     Final   Value: NO GROWTH     Performed at [facility]   Report Status 10/15/2013 FINAL   Final  CULTURE,  EXPECTORATED SPUTUM-ASSESSMENT     Status: None    Collection Time    10/14/13  2:59 PM      Result Value Ref Range Status   Specimen Description SPUTUM   Final   Special Requests NONE   Final   Sputum evaluation     Final   Value: THIS SPECIMEN IS ACCEPTABLE. RESPIRATORY CULTURE REPORT TO FOLLOW.   Report Status 10/14/2013 FINAL   Final  CULTURE, RESPIRATORY (NON-EXPECTORATED)     Status: None   Collection Time    10/14/13  2:59 PM      Result Value Ref Range Status   Specimen Description SPUTUM   Final   Special Requests NONE   Final   Gram Stain     Final   Value: RARE WBC PRESENT, PREDOMINANTLY MONONUCLEAR     FEW SQUAMOUS EPITHELIAL CELLS PRESENT     MODERATE GRAM POSITIVE COCCI IN PAIRS     IN CLUSTERS FEW GRAM POSITIVE RODS     RARE GRAM NEGATIVE RODS   Culture     Final   Value: NORMAL OROPHARYNGEAL FLORA     Performed at [facility]   Report Status 10/16/2013 FINAL   Final  CULTURE, BLOOD (ROUTINE X 2)     Status: None   Collection Time    10/19/13  4:55 PM      Result Value Ref Range Status   Specimen Description BLOOD LEFT ARM   Final   Special Requests     Final   Value: BOTTLES DRAWN AEROBIC AND ANAEROBIC 10CC BOTH BOTTLES   Culture  Setup Time     Final   Value: 10/19/2013 22:40     Performed at [facility]   Culture     Final   Value:        BLOOD CULTURE RECEIVED NO GROWTH TO DATE CULTURE WILL BE HELD FOR 5 DAYS BEFORE ISSUING A FINAL NEGATIVE REPORT     Performed at [facility]   Report Status PENDING   Incomplete  BODY FLUID CULTURE     Status: None   Collection Time    10/20/13  5:28 PM      Result Value Ref Range Status   Specimen Description OTHER   Final   Special Requests Normal JP DRAIN IN RIGHT ABDOMEN   Final   Gram Stain     Final   Value: MODERATE WBC PRESENT,BOTH PMN AND MONONUCLEAR     YEAST     Performed at [facility]   Culture PENDING   Incomplete   Report Status PENDING   Incomplete    Studies/Results: Ct Angio Chest Pe W/cm &/or Wo  Cm  10/20/2013   CLINICAL DATA:  History of malignant gastric tumor removal now with mid abdominal pain and tachycardia ; history of lupus and Sjogren's disease  EXAM: CT ANGIOGRAPHY CHEST  CT ABDOMEN AND PELVIS WITH CONTRAST  TECHNIQUE: Multidetector CT imaging of the chest was performed using the standard protocol during bolus administration of intravenous contrast. Multiplanar CT image reconstructions and MIPs were obtained to evaluate the vascular anatomy. Multidetector CT imaging of the abdomen and pelvis was performed using the standard protocol during bolus administration of intravenous contrast.  CONTRAST:  160mL OMNIPAQUE IOHEXOL 350 MG/ML SOLN, 94mL OMNIPAQUE IOHEXOL 300 MG/ML SOLN  COMPARISON:  Portable chest x-ray of today's date CT scan of the chest and abdomen and pelvis of October 03, 2013  FINDINGS: CTA CHEST FINDINGS  Contrast  within the pulmonary arterial tree is normal. The caliber of the thoracic aorta is normal. There is no false lumen. There is a new moderate size left pleural effusion layering posteriorly. There is atelectasis of much of the left lower lobe and atelectasis of the posterior inferior aspect of the right lower lobe. The cardiac chambers are top-normal in size. There is no pericardial effusion. There is a precarinal lymph node which measures 9 mm in short axis. There is no sub carinal, AP window, or axillary lymphadenopathy. There is a small hiatal hernia.  The observed portions of the bony thorax are normal.  CT ABDOMEN and PELVIS FINDINGS  The patient has undergone partial resection of the left hepatic lobe. There is a small amount of fluid at the surgical site. There is a drainage catheter in place which enters the abdominal cavity tip to the right of midline. The tip comes to lie superior to the spleen adjacent to the medial aspect of the remaining portion of the stomach. There are a few normal sized perigastric lymph nodes. There is no bulky lymphadenopathy.  There is minimal  intrahepatic ductal dilation. The gallbladder is normal. The pancreas exhibits no inflammatory change or focal mass. There is a thin walled, elliptical fluid collection inferior to the tip of the spleen which measures 8.3 cm AP x 2.5 cm transversely x 4.2 cm longitudinally with HU value of +14. The kidneys and adrenal glands are normal. The periaortic and pericaval regions are normal.  Contrast has traversed the stomach and reached as far distally as the proximal transverse colon. There is no ileus, obstruction, or acute inflammation. There is small amount of free pelvic fluid. The uterus exhibits heterogeneous density and there is likely a fibroid in the fundus measuring 3.2 cm in diameter. There is a tiny amount of gas within the partially distended urinary bladder. There is no inguinal hernia. There is a small amount of fluid within the midline incision.  The lumbar spine and bony pelvis are unremarkable.  Review of the MIP images confirms the above findings.  IMPRESSION: 1. There is no acute pulmonary embolism nor acute thoracic aortic pathology. 2. Left lower lobe atelectasis and/or pneumonia with moderate-sized left pleural effusion new since July 10th. There mild posterior right lower lobe atelectasis. 3. There are surgical changes from the previous partial left gastrectomy. A drainage catheter is in place in the gastric band and no abnormal fluid collection is seen here. There is a fluid collection inferior to the spleen compatible with a seroma or resolved hematoma. There is a small amount of free pelvic fluid. 4. Postsurgical changes in the left hepatic lobe are noted. A small amount of fluid is present at the surgical site but no abscess is demonstrated. 5. There is no acute bowel abnormality nor acute hepatobiliary abnormality. There is a small amount of fluid in the midline incision.   Electronically Signed   By: David  Martinique   On: 10/20/2013 13:15   Ct Abdomen Pelvis W Contrast  10/20/2013    CLINICAL DATA:  Status post subtotal gastrectomy.  EXAM: CT CHEST, ABDOMEN, AND PELVIS WITH CONTRAST  TECHNIQUE: Multidetector CT imaging of the chest, abdomen and pelvis was performed following the standard protocol during bolus administration of intravenous contrast.  CONTRAST:  181mL OMNIPAQUE IOHEXOL 350 MG/ML SOLN, 63mL OMNIPAQUE IOHEXOL 300 MG/ML SOLN  COMPARISON:  CT scan of October 03, 2013.  FINDINGS: CT CHEST FINDINGS  No pneumothorax is noted. Large left pleural effusion is noted with atelectasis  of the left lower lobe. Mild right posterior basilar subsegmental atelectasis is noted. Right-sided PICC line is noted with distal tip at the cavoatrial junction. There is no evidence of pulmonary embolus. Thoracic aorta and great vessels appear normal without evidence of dissection or aneurysm. Bilateral axillary adenopathy is noted which is unchanged compared to prior exam, with the largest measuring 21 x 11 mm on the right side. Precarinal lymph node measuring 12 x 9 mm is noted which most likely is benign.  CT ABDOMEN AND PELVIS FINDINGS  No gallstones are noted. Patient is status post partial gastrectomy with Roux en Y procedure. The spleen and pancreas appear normal. The low density seen in the left hepatic lobe on prior exam is significantly improved compared to prior exam and is most consistent with focal fatty infiltration as opposed to neoplasm. No other abnormality seen in hepatic parenchyma. Adrenal glands and kidneys appear normal. No hydronephrosis or renal obstruction is in noted. There is noted a lenticular shaped fluid collection measuring 8.5 x 2.8 cm inferior to the spleen. Surgical drain remains within the epigastric region. Expected amount of postoperative fluid is seen in the epigastric region anteriorly. Minimal amount of fluid is noted in the right pericolic gutter. There is no evidence of bowel obstruction. Urinary bladder appears normal. Continued presence of probable fibroid is noted and  uterine fundus. Interval development of fluid collection with enhancing margins is noted in the presacral space of the pelvis measuring 3.9 x 3.1 cm. Anterior midline surgical incision is noted with a small amount of associated fluid.  IMPRESSION: No evidence of pulmonary embolus.  Large left pleural effusion is noted with associated atelectasis of the left lower lobe.  Stable bilateral axillary and inguinal adenopathy is noted which which may be reactive, but metastatic disease cannot be excluded. PET scan may be performed further evaluation.  Status post partial gastrectomy with expected postoperative changes. Surgical drain remains in the epigastric region.  Abnormality noted in left hepatic lobe on prior exam is significantly improved to most likely represents focal fatty infiltration.  Stable uterine fibroid.  There is noted 8.5 x 2.8 cm lenticular shaped fluid collection inferior to the spleen concerning for possible abscess or seroma. Also noted is 3.9 x 3.1 cm fluid collection seen in the presacral space of the posterior pelvis which may represent abscess is well. Critical Value/emergent results were called by telephone at the time of interpretation on 10/20/2013 at 1:37 pm to [facility] , who verbally acknowledged these results.   Electronically Signed   By: Sabino Dick M.D.   On: 10/20/2013 13:39   Ct Aspiration  10/21/2013   CLINICAL DATA:  Abdominal and pelvic abscesses  EXAM: CT GUIDED ASPIRATION BIOPSY OF ABDOMINAL AND PELVIC ABSCESSES  ANESTHESIA/SEDATION: Four  Mg IV Versed; 100 mcg IV Fentanyl  Total Moderate Sedation Time: 30 minutes.  PROCEDURE: The procedure risks, benefits, and alternatives were explained to the patient. Questions regarding the procedure were encouraged and answered. The patient understands and consents to the procedure.  The abdomen was prepped with Betadinein a sterile fashion, and a sterile drape was applied covering the operative field. A sterile gown and sterile gloves  were used for the procedure. Local anesthesia was provided with 1% Lidocaine.  Under CT guidance, an 18 gauge needle was advanced into the perisplenic fluid collection. 15 cc serous fluid was aspirated and sent for culture.  In the prone position, the right gluteal region was prepped and draped in a sterile fashion. Under  CT guidance, a second 18 gauge needle was advanced into the pelvic fluid collection via right trans gluteal approach. 12 cc cloudy reddish fluid was aspirated. Samples were sent for culture.  Imaging with documentation was performed.  Complications: None.  FINDINGS: The series of images demonstrate needle placement into the perisplenic and pelvic fluid collections. Post aspiration imaging demonstrates near resolution of the fluid collections.  IMPRESSION: Successful aspiration of 2 fluid collections as described above. The perisplenic aspiration yielded serous fluid in the pelvic aspiration yielded purulent bloody fluid.   Electronically Signed   By: Maryclare Bean M.D.   On: 10/21/2013 12:26   Ct Aspiration  10/21/2013   CLINICAL DATA:  Abdominal and pelvic abscesses  EXAM: CT GUIDED ASPIRATION BIOPSY OF ABDOMINAL AND PELVIC ABSCESSES  ANESTHESIA/SEDATION: Four  Mg IV Versed; 100 mcg IV Fentanyl  Total Moderate Sedation Time: 30 minutes.  PROCEDURE: The procedure risks, benefits, and alternatives were explained to the patient. Questions regarding the procedure were encouraged and answered. The patient understands and consents to the procedure.  The abdomen was prepped with Betadinein a sterile fashion, and a sterile drape was applied covering the operative field. A sterile gown and sterile gloves were used for the procedure. Local anesthesia was provided with 1% Lidocaine.  Under CT guidance, an 18 gauge needle was advanced into the perisplenic fluid collection. 15 cc serous fluid was aspirated and sent for culture.  In the prone position, the right gluteal region was prepped and draped in a sterile  fashion. Under CT guidance, a second 18 gauge needle was advanced into the pelvic fluid collection via right trans gluteal approach. 12 cc cloudy reddish fluid was aspirated. Samples were sent for culture.  Imaging with documentation was performed.  Complications: None.  FINDINGS: The series of images demonstrate needle placement into the perisplenic and pelvic fluid collections. Post aspiration imaging demonstrates near resolution of the fluid collections.  IMPRESSION: Successful aspiration of 2 fluid collections as described above. The perisplenic aspiration yielded serous fluid in the pelvic aspiration yielded purulent bloody fluid.   Electronically Signed   By: Maryclare Bean M.D.   On: 10/21/2013 12:26     Assessment/Plan: Leukocytosis  Pneumonia  abd abscesses   Body fluid cx (7-27) yeast on stain Severe protein calorie malnutrition   Total days of antibiotics: 10  levaquin 7-19 -->7-21  Cefepime 7-21 --> 7-27 Vancomycin 7-21 -->  Imipenem 7-27 --> Diflucan 7-28 -->   Would change diflucan to mycafungin Add fungal cx to routine cx Await cx Await thoracentesis results WBC unchanged         Bobby Rumpf Infectious Diseases (pager) [phone] [email or web address] 10/21/2013, 2:57 PM  LOS: 13 days   **Disclaimer: This note may have been dictated with voice recognition software. Similar sounding words can inadvertently be transcribed and this note may contain transcription errors which may not have been corrected upon publication of note.**

## 2013-10-21 NOTE — Progress Notes (Signed)
Adding antifungal to regimen.  Pulm seeing her about the pleural effusion which is also likely reactive in nature.    OK to advance to full liquids, but given extent of surgery, it is unlikely that she will be able to meet caloric needs with diet for a bit.  Would not wean TNA yet.

## 2013-10-22 ENCOUNTER — Other Ambulatory Visit: Payer: Self-pay | Admitting: *Deleted

## 2013-10-22 DIAGNOSIS — L03319 Cellulitis of trunk, unspecified: Secondary | ICD-10-CM

## 2013-10-22 DIAGNOSIS — L02219 Cutaneous abscess of trunk, unspecified: Secondary | ICD-10-CM

## 2013-10-22 DIAGNOSIS — B3789 Other sites of candidiasis: Secondary | ICD-10-CM

## 2013-10-22 LAB — BASIC METABOLIC PANEL
Anion gap: 11 (ref 5–15)
BUN: 10 mg/dL (ref 6–23)
CALCIUM: 8.9 mg/dL (ref 8.4–10.5)
CO2: 24 mEq/L (ref 19–32)
CREATININE: 0.44 mg/dL — AB (ref 0.50–1.10)
Chloride: 99 mEq/L (ref 96–112)
Glucose, Bld: 112 mg/dL — ABNORMAL HIGH (ref 70–99)
Potassium: 4.3 mEq/L (ref 3.7–5.3)
Sodium: 134 mEq/L — ABNORMAL LOW (ref 137–147)

## 2013-10-22 LAB — GLUCOSE, CAPILLARY
GLUCOSE-CAPILLARY: 111 mg/dL — AB (ref 70–99)
Glucose-Capillary: 103 mg/dL — ABNORMAL HIGH (ref 70–99)
Glucose-Capillary: 113 mg/dL — ABNORMAL HIGH (ref 70–99)
Glucose-Capillary: 116 mg/dL — ABNORMAL HIGH (ref 70–99)
Glucose-Capillary: 136 mg/dL — ABNORMAL HIGH (ref 70–99)

## 2013-10-22 LAB — CBC
HCT: 23.6 % — ABNORMAL LOW (ref 36.0–46.0)
Hemoglobin: 7.2 g/dL — ABNORMAL LOW (ref 12.0–15.0)
MCH: 23.7 pg — ABNORMAL LOW (ref 26.0–34.0)
MCHC: 30.5 g/dL (ref 30.0–36.0)
MCV: 77.6 fL — ABNORMAL LOW (ref 78.0–100.0)
PLATELETS: 583 10*3/uL — AB (ref 150–400)
RBC: 3.04 MIL/uL — ABNORMAL LOW (ref 3.87–5.11)
RDW: 20.7 % — AB (ref 11.5–15.5)
WBC: 17.5 10*3/uL — ABNORMAL HIGH (ref 4.0–10.5)

## 2013-10-22 LAB — URINALYSIS, ROUTINE W REFLEX MICROSCOPIC
Bilirubin Urine: NEGATIVE
Glucose, UA: NEGATIVE mg/dL
Hgb urine dipstick: NEGATIVE
Ketones, ur: NEGATIVE mg/dL
Leukocytes, UA: NEGATIVE
Nitrite: NEGATIVE
Protein, ur: NEGATIVE mg/dL
Specific Gravity, Urine: 1.015 (ref 1.005–1.030)
Urobilinogen, UA: 0.2 mg/dL (ref 0.0–1.0)
pH: 7 (ref 5.0–8.0)

## 2013-10-22 LAB — FERRITIN: Ferritin: 2599 ng/mL — ABNORMAL HIGH (ref 10–291)

## 2013-10-22 LAB — VANCOMYCIN, TROUGH: Vancomycin Tr: 17.7 ug/mL (ref 10.0–20.0)

## 2013-10-22 MED ORDER — TRACE MINERALS CR-CU-F-FE-I-MN-MO-SE-ZN IV SOLN
INTRAVENOUS | Status: AC
  Administered 2013-10-22: 17:00:00 via INTRAVENOUS
  Filled 2013-10-22: qty 2000

## 2013-10-22 MED ORDER — FAT EMULSION 20 % IV EMUL
250.0000 mL | INTRAVENOUS | Status: AC
  Administered 2013-10-22: 250 mL via INTRAVENOUS
  Filled 2013-10-22: qty 250

## 2013-10-22 MED ORDER — BOOST / RESOURCE BREEZE PO LIQD
1.0000 | Freq: Two times a day (BID) | ORAL | Status: DC
Start: 2013-10-22 — End: 2013-10-23
  Administered 2013-10-22 – 2013-10-23 (×3): 1 via ORAL

## 2013-10-22 NOTE — Progress Notes (Signed)
[facility] PROGRESS NOTE  RAEANNE DESCHLER PXT:[id number] DOB: [birth year] DOA: 10/08/2013 PCP: Conni Slipper, MD   Brief narrative:  43 y.o. 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 She underwent subtotal gastrectomy with porta hepatis and peripancreatic lymph node dissection with a Roux-en-Y gastrojejunostomy on 10/10/13. Hospital course was complicated due to development of HCAP for which she is on maxipime and vancomycin and now with persistent tachycardia, low grade fever and progressive leucocytosis.  Assessment/Plan: Fever/tachycardia/   leucocytosis/ HCAP  -CXR was suggestive of developing PNA in left lower lobe post op on 7/19. Patient started on Levaquin but changed to maxipime and vanco on 10/14/13. Repeat CXR 10/18/2013 still showing consolidation in the left lower lobe.  -ID consult appreciated . patient ruled out for DVT and PE. CT of chest and abd shows left pleural effusion and fluid collection inferior to spleen and free pelvic fluid.  - Perisplenic and pelvic fluid aspirated by  IR on 7/28 and sent for cx. Preliminary cx growing yeast.  Added empiric mycafungin. Also had 270 cc dark yellow fluid aspirated from left pleura and sent for analysis. Suggests exudate. cx pending -continue full liquids with TNA.  -Blood cx so far negative. Afebrile and leucocytosis improving.  Continue current abx. Still has episodes of tachycardia but overall appears improved.   Gastric outlet obstruction secondary to gastric cancer  -Status post subtotal gastrectomy with porta hepatis and peripancreatic lymph node dissection with a Roux-en-Y gastrojejunostomy on 10/10/13. Was in SDU post-op. Remains stable on telemetry and now tolerating clear liquids.. Patient required NG tube  post-operatively but removed on 7/25.  Dr. Benay Spice saw the patient in consultation. Plan on adjuvant chemotherapy.  -added oxycodone for pain control to avoid dilaudid Advance diet to full liquid. Calorie count started with plan on stopping TNA.  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 Check iron panel Hemoglobin ranges from 7-8.0 .   Protein-calorie malnutrition Added resource breeze bid. Diet changed to soft. On TNA.    Lupus/Sjogren syndrome  Plaquenil and prednisone resumed.   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 Will need gyn follow up as outpt  DVT Prophylaxis  Resumed sq lovenox for prophylaxis as Hb has been stable around 7   Code Status: full code Family Communication: none at bedside Disposition Plan: home once off TNA   Consultants:  ID   CCS   Dr Learta Codding  Pulmonary   IR    Procedures:  Subtotal gastrectomy with porta hepatis and peripancreatic lymph node dissection, Roux-en-Y gastrojejunostomy 10/10/13 by Dr. Excell Seltzer.  Aspiration of perisplenic and pelvic fluid on 7/28. Left thoracentesis on 7/28  Antibiotics:  Levaquin 7/19 --> 7/21  Maxipime 7/21 -->  Vancomycin 7/21 -->   HPI/Subjective: abdominal pain improved. Denies any symptoms   Objective: Filed Vitals:   10/22/13 1405  BP: 112/59  Pulse: 71  Temp: 98.2 F (36.8 C)  Resp: 19    Intake/Output Summary (Last 24 hours) at 10/22/13 1718 Last data filed at 10/22/13 1500  Gross per 24 hour  Intake   3625 ml  Output   2030 ml  Net   1595 ml   Filed Weights   10/12/13 0400 10/13/13  0400 10/18/13 0548  Weight: 74.2 kg (163 lb 9.3 oz) 73.4 kg (161 lb 13.1 oz) 71.305 kg (157 lb 3.2 oz)    Exam:   General:  NAD  HEENT: pallor+, no icterus, moist mucosa  Chest: clear to  auscultation b/l,   CVS: S1&S2 tachycardic, no MRG  Abd: soft, dressing over midline incision site  clean.  JP drain with milky discharge.BS+, non tender  Ext: warm, no edema,   CNS: alert and oriented  Data Reviewed: Basic Metabolic Panel:  Recent Labs Lab 10/16/13 0530  10/18/13 0455 10/19/13 0405 10/20/13 0500 10/21/13 0510 10/22/13 0410  NA 134*  < > 134* 134* 134* 134* 134*  K 3.6*  < > 3.7 3.8 3.8 4.1 4.3  CL 99  < > 100 100 100 98 99  CO2 26  < > 24 22 22 23 24   GLUCOSE 117*  < > 116* 107* 108* 120* 112*  BUN 7  < > 9 9 9 10 10   CREATININE 0.38*  < > 0.44* 0.48* 0.47* 0.43* 0.44*  CALCIUM 8.8  < > 8.8 8.7 8.7 8.9 8.9  MG 2.0  --   --   --  1.9  --   --   PHOS 4.4  --   --   --  4.5  --   --   < > = values in this interval not displayed. Liver Function Tests:  Recent Labs Lab 10/16/13 0530 10/20/13 0500 10/21/13 0510  AST 29 142*  --   ALT 19 131*  --   ALKPHOS 165* 481*  --   BILITOT 0.3 <0.2*  --   PROT 7.0 7.7 7.5  ALBUMIN 1.6* 1.8*  --     Recent Labs Lab 10/19/13 1655 10/20/13 1214  LIPASE 121* 151*  AMYLASE 173*  --    No results found for this basename: AMMONIA,  in the last 168 hours CBC:  Recent Labs Lab 10/18/13 0455 10/19/13 0405 10/20/13 0500 10/21/13 0510 10/22/13 0410  WBC 17.8* 18.6* 22.1* 23.7* 17.5*  NEUTROABS  --   --  18.2*  --   --   HGB 7.2* 7.2* 7.3* 7.0* 7.2*  HCT 23.6* 23.2* 23.7* 23.7* 23.6*  MCV 74.2* 73.9* 74.8* 76.7* 77.6*  PLT 346 424* 514* 542* 583*   Cardiac Enzymes:  Recent Labs Lab 10/16/13 2230 10/17/13 0620  TROPONINI <0.30 <0.30   BNP (last 3 results) No results found for this basename: PROBNP,  in the last 8760 hours CBG:  Recent Labs Lab 10/21/13 1155 10/21/13 1705 10/21/13 2354 10/22/13 0628 10/22/13 1149  GLUCAP 121* 122* 136* 103* 116*    Recent Results (from the past 240 hour(s))  CULTURE, BLOOD (ROUTINE X 2)     Status: None   Collection Time    10/14/13 11:50 AM       Result Value Ref Range Status   Specimen Description BLOOD LEFT ARM   Final   Special Requests BOTTLES DRAWN AEROBIC AND ANAEROBIC [facility] EACH   Final   Culture  Setup Time     Final   Value: 10/14/2013 14:17     Performed at [facility]   Culture     Final   Value: NO GROWTH 5 DAYS     Performed at [facility]   Report Status 10/20/2013 FINAL   Final  CULTURE, BLOOD (ROUTINE X 2)     Status: None   Collection Time    10/14/13 11:53 AM  Result Value Ref Range Status   Specimen Description BLOOD LEFT HAND   Final   Special Requests BOTTLES DRAWN AEROBIC AND ANAEROBIC [facility] EACH   Final   Culture  Setup Time     Final   Value: 10/14/2013 14:17     Performed at [facility]   Culture     Final   Value: NO GROWTH 5 DAYS     Performed at [facility]   Report Status 10/20/2013 FINAL   Final  URINE CULTURE     Status: None   Collection Time    10/14/13 12:09 PM      Result Value Ref Range Status   Specimen Description URINE, CATHETERIZED   Final   Special Requests NONE   Final   Culture  Setup Time     Final   Value: 10/14/2013 17:48     Performed at [place]     Final   Value: NO GROWTH     Performed at [facility]   Culture     Final   Value: NO GROWTH     Performed at [facility]   Report Status 10/15/2013 FINAL   Final  CULTURE, EXPECTORATED SPUTUM-ASSESSMENT     Status: None   Collection Time    10/14/13  2:59 PM      Result Value Ref Range Status   Specimen Description SPUTUM   Final   Special Requests NONE   Final   Sputum evaluation     Final   Value: THIS SPECIMEN IS ACCEPTABLE. RESPIRATORY CULTURE REPORT TO FOLLOW.   Report Status 10/14/2013 FINAL   Final  CULTURE, RESPIRATORY (NON-EXPECTORATED)     Status: None   Collection Time    10/14/13  2:59 PM      Result Value Ref Range Status   Specimen Description SPUTUM   Final   Special Requests NONE   Final   Gram Stain     Final    Value: RARE WBC PRESENT, PREDOMINANTLY MONONUCLEAR     FEW SQUAMOUS EPITHELIAL CELLS PRESENT     MODERATE GRAM POSITIVE COCCI IN PAIRS     IN CLUSTERS FEW GRAM POSITIVE RODS     RARE GRAM NEGATIVE RODS   Culture     Final   Value: NORMAL OROPHARYNGEAL FLORA     Performed at [facility]   Report Status 10/16/2013 FINAL   Final  CULTURE, BLOOD (ROUTINE X 2)     Status: None   Collection Time    10/19/13  4:55 PM      Result Value Ref Range Status   Specimen Description BLOOD LEFT ARM   Final   Special Requests     Final   Value: BOTTLES DRAWN AEROBIC AND ANAEROBIC 10CC BOTH BOTTLES   Culture  Setup Time     Final   Value: 10/19/2013 22:40     Performed at [facility]   Culture     Final   Value:        BLOOD CULTURE RECEIVED NO GROWTH TO DATE CULTURE WILL BE HELD FOR 5 DAYS BEFORE ISSUING A FINAL NEGATIVE REPORT     Performed at [facility]   Report Status PENDING   Incomplete  BODY FLUID CULTURE     Status: None   Collection Time    10/20/13  5:28 PM      Result Value Ref Range Status   Specimen  Description OTHER   Final   Special Requests Normal JP DRAIN IN RIGHT ABDOMEN   Final   Gram Stain     Final   Value: MODERATE WBC PRESENT,BOTH PMN AND MONONUCLEAR     YEAST     Performed at [facility]   Culture     Final   Value: MODERATE YEAST     Performed at [facility]   Report Status PENDING   Incomplete  CULTURE, ROUTINE-ABSCESS     Status: None   Collection Time    10/21/13 10:58 AM      Result Value Ref Range Status   Specimen Description PERITONEAL CAVITY PERISPLENIC   Final   Special Requests NONE   Final   Gram Stain     Final   Value: FEW WBC PRESENT,BOTH PMN AND MONONUCLEAR     NO ORGANISMS SEEN     Performed at [facility]   Culture     Final   Value: NO GROWTH 1 DAY     Performed at [facility]   Report Status PENDING   Incomplete  BODY FLUID CULTURE     Status: None   Collection Time     10/21/13 10:58 AM      Result Value Ref Range Status   Specimen Description PERITONEAL CAVITY PELVIC   Final   Special Requests NONE   Final   Gram Stain     Final   Value: ABUNDANT WBC PRESENT,BOTH PMN AND MONONUCLEAR     NO ORGANISMS SEEN     Performed at [facility]   Culture     Final   Value: NO GROWTH 1 DAY     Performed at [facility]   Report Status PENDING   Incomplete  FUNGUS CULTURE W SMEAR     Status: None   Collection Time    10/21/13  3:30 PM      Result Value Ref Range Status   Specimen Description PLEURAL   Final   Special Requests Normal   Final   Fungal Smear     Final   Value: NO YEAST OR FUNGAL ELEMENTS SEEN     Performed at [facility]   Culture     Final   Value: CULTURE IN PROGRESS FOR FOUR WEEKS     Performed at [facility]   Report Status PENDING   Incomplete  AFB CULTURE WITH SMEAR     Status: None   Collection Time    10/21/13  3:49 PM      Result Value Ref Range Status   Specimen Description PLEURAL   Final   Special Requests Normal   Final   Acid Fast Smear     Final   Value: NO ACID FAST BACILLI SEEN     Performed at [facility]   Culture     Final   Value: CULTURE WILL BE EXAMINED FOR 6 WEEKS BEFORE ISSUING A FINAL REPORT     Performed at [facility]   Report Status PENDING   Incomplete  BODY FLUID CULTURE     Status: None   Collection Time    10/21/13  3:49 PM      Result Value Ref Range Status   Specimen Description PLEURAL   Final   Special Requests Normal   Final   Gram Stain     Final   Value: FEW WBC PRESENT,BOTH PMN AND MONONUCLEAR  NO ORGANISMS SEEN     Performed at [facility]   Culture     Final   Value: NO GROWTH 1 DAY     Performed at [facility]   Report Status PENDING   Incomplete     Studies: Dg Chest 1 View  10/21/2013   CLINICAL DATA:  LEFT thoracentesis.  EXAM: CHEST - 1 VIEW  COMPARISON:  10/20/2013.  FINDINGS: There is no pneumothorax  status post LEFT thoracentesis. LEFT pleural effusion and atelectasis remains present. RIGHT basilar atelectasis. RIGHT upper extremity PICC tip terminates at the junction of the superior vena cava and RIGHT atrium. Surgical clips are present in the upper abdomen. Cardiopericardial silhouette is within normal limits.  IMPRESSION: No pneumothorax status post LEFT thoracentesis. LEFT-greater-than-RIGHT bilateral basilar atelectasis and small LEFT pleural effusion.   Electronically Signed   By: Dereck Ligas M.D.   On: 10/21/2013 16:06   Ct Aspiration  10/21/2013   CLINICAL DATA:  Abdominal and pelvic abscesses  EXAM: CT GUIDED ASPIRATION BIOPSY OF ABDOMINAL AND PELVIC ABSCESSES  ANESTHESIA/SEDATION: Four  Mg IV Versed; 100 mcg IV Fentanyl  Total Moderate Sedation Time: 30 minutes.  PROCEDURE: The procedure risks, benefits, and alternatives were explained to the patient. Questions regarding the procedure were encouraged and answered. The patient understands and consents to the procedure.  The abdomen was prepped with Betadinein a sterile fashion, and a sterile drape was applied covering the operative field. A sterile gown and sterile gloves were used for the procedure. Local anesthesia was provided with 1% Lidocaine.  Under CT guidance, an 18 gauge needle was advanced into the perisplenic fluid collection. 15 cc serous fluid was aspirated and sent for culture.  In the prone position, the right gluteal region was prepped and draped in a sterile fashion. Under CT guidance, a second 18 gauge needle was advanced into the pelvic fluid collection via right trans gluteal approach. 12 cc cloudy reddish fluid was aspirated. Samples were sent for culture.  Imaging with documentation was performed.  Complications: None.  FINDINGS: The series of images demonstrate needle placement into the perisplenic and pelvic fluid collections. Post aspiration imaging demonstrates near resolution of the fluid collections.  IMPRESSION:  Successful aspiration of 2 fluid collections as described above. The perisplenic aspiration yielded serous fluid in the pelvic aspiration yielded purulent bloody fluid.   Electronically Signed   By: Maryclare Bean M.D.   On: 10/21/2013 12:26   Ct Aspiration  10/21/2013   CLINICAL DATA:  Abdominal and pelvic abscesses  EXAM: CT GUIDED ASPIRATION BIOPSY OF ABDOMINAL AND PELVIC ABSCESSES  ANESTHESIA/SEDATION: Four  Mg IV Versed; 100 mcg IV Fentanyl  Total Moderate Sedation Time: 30 minutes.  PROCEDURE: The procedure risks, benefits, and alternatives were explained to the patient. Questions regarding the procedure were encouraged and answered. The patient understands and consents to the procedure.  The abdomen was prepped with Betadinein a sterile fashion, and a sterile drape was applied covering the operative field. A sterile gown and sterile gloves were used for the procedure. Local anesthesia was provided with 1% Lidocaine.  Under CT guidance, an 18 gauge needle was advanced into the perisplenic fluid collection. 15 cc serous fluid was aspirated and sent for culture.  In the prone position, the right gluteal region was prepped and draped in a sterile fashion. Under CT guidance, a second 18 gauge needle was advanced into the pelvic fluid collection via right trans gluteal approach. 12 cc cloudy reddish fluid was aspirated. Samples  were sent for culture.  Imaging with documentation was performed.  Complications: None.  FINDINGS: The series of images demonstrate needle placement into the perisplenic and pelvic fluid collections. Post aspiration imaging demonstrates near resolution of the fluid collections.  IMPRESSION: Successful aspiration of 2 fluid collections as described above. The perisplenic aspiration yielded serous fluid in the pelvic aspiration yielded purulent bloody fluid.   Electronically Signed   By: Maryclare Bean M.D.   On: 10/21/2013 12:26   US Thoracentesis Asp Pleural Space W/img Guide  10/21/2013    CLINICAL DATA:  Left-sided pleural effusion. Request diagnostic and therapeutic thoracentesis.  EXAM: ULTRASOUND GUIDED LEFT THORACENTESIS  COMPARISON:  None.  PROCEDURE: An ultrasound guided thoracentesis was thoroughly discussed with the patient and questions answered. The benefits, risks, alternatives and complications were also discussed. The patient understands and wishes to proceed with the procedure. Written consent was obtained.  Ultrasound was performed to localize and mark an adequate pocket of fluid in the left chest. The area was then prepped and draped in the normal sterile fashion. 1% Lidocaine was used for local anesthesia. Under ultrasound guidance a 19 gauge Yueh catheter was introduced. Thoracentesis was performed. The catheter was removed and a dressing applied.  Complications:  None immediate  FINDINGS: A total of approximately 270 mL of hazy, dark yellow fluid was removed. A fluid sample wassent for laboratory analysis.  IMPRESSION: Successful ultrasound guided left thoracentesis yielding 270 mL of pleural fluid.  Read by: Ascencion Dike PA-C   Electronically Signed   By: Maryclare Bean M.D.   On: 10/21/2013 15:54    Scheduled Meds: . antiseptic oral rinse  15 mL Mouth Rinse q12n4p  . chlorhexidine  15 mL Mouth Rinse BID  . feeding supplement (RESOURCE BREEZE)  1 Container Oral BID BM  . hydroxychloroquine  200 mg Oral BID  . imipenem-cilastatin  500 mg Intravenous 4 times per day  . insulin aspart  0-9 Units Subcutaneous 4 times per day  . methocarbamol (ROBAXIN) IV  1,000 mg Intravenous Q8H  . metoCLOPramide (REGLAN) injection  5 mg Intravenous 3 times per day  . micafungin [facility]) IV  100 mg Intravenous Daily  . ondansetron (ZOFRAN) IV  4 mg Intravenous 4 times per day  . pantoprazole (PROTONIX) IV  40 mg Intravenous QHS  . predniSONE  5 mg Oral BID WC  . sodium chloride  10-40 mL Intracatheter Q12H  . vancomycin  1,000 mg Intravenous Q8H   Continuous Infusions: . sodium  chloride 30 mL/hr at 10/17/13 0514  . Marland KitchenTPN (CLINIMIX-E) Adult 80 mL/hr at 10/21/13 1758   And  . fat emulsion 250 mL (10/21/13 1758)  . Marland KitchenTPN (CLINIMIX-E) Adult 80 mL/hr at 10/22/13 1713   And  . fat emulsion 250 mL (10/22/13 1713)      Time spent: 25 minutes    Tavon Magnussen, [place]  [facility] Pager [phone] If 7PM-7AM, please contact night-coverage at [email or web address], password [facility] 10/22/2013, 5:18 PM  LOS: 14 days

## 2013-10-22 NOTE — Progress Notes (Signed)
Patient ID: Andrea Best, female   DOB: [birth year], 43 y.o.   MRN: [id number]  Subjective: Tolerated clears.  Hungry.  Had a BM.  Ambulating.  WBC down.  Afebrile.    Objective:  Vital signs:  Filed Vitals:   10/21/13 1530 10/21/13 1555 10/21/13 2052 10/22/13 0628  BP: 108/59 97/47 110/58 108/61  Pulse:   102 98  Temp:   99.1 F (37.3 C) 98.2 F (36.8 C)  TempSrc:   Oral Oral  Resp:   24 16  Height:      Weight:      SpO2:   100% 100%    Last BM Date: 10/22/13 (per patient. )  Intake/Output   Yesterday:  07/28 0701 - 07/29 0700 In: 4080 [I.V.:540; IV Piggyback:1740; TPN:1800] Out: 9373 [Urine:1820; Drains:15] This shift:  Total I/O In: -  Out: 200 [Urine:200]   PE:  Gen: Alert, NAD, pleasant  Abd: Soft, mild tenderness, ND, +BS, no HSM, midline incisions with staples in place, in the middle of the incision site 3 staples were removed and packed with wet gauze with dry dressing, no foul smell or purulent drainage was noted (only serosanguinous), drain with whitish tan cloudy purulent drainage   Problem List:   Principal Problem:   Gastric outlet obstruction Active Problems:   Gastric cancer   Anemia, iron deficiency   Protein-calorie malnutrition, severe   Hypomagnesemia   Hyperphosphatemia   Fever, unspecified   Tachycardia   Leucocytosis   HCAP (healthcare-associated pneumonia)    Results:   Labs: Results for orders placed during the hospital encounter of 10/08/13 (from the past 48 hour(s))  BODY FLUID CULTURE     Status: None   Collection Time    10/20/13  5:28 PM      Result Value Ref Range   Specimen Description OTHER     Special Requests Normal JP DRAIN IN RIGHT ABDOMEN     Gram Stain       Value: MODERATE WBC PRESENT,BOTH PMN AND MONONUCLEAR     YEAST     Performed at [facility]   Culture       Value: MODERATE YEAST     Performed at [facility]   Report Status PENDING    GLUCOSE, CAPILLARY     Status: Abnormal    Collection Time    10/20/13  6:23 PM      Result Value Ref Range   Glucose-Capillary 157 (*) 70 - 99 mg/dL  GLUCOSE, CAPILLARY     Status: Abnormal   Collection Time    10/20/13 11:19 PM      Result Value Ref Range   Glucose-Capillary 120 (*) 70 - 99 mg/dL  BASIC METABOLIC PANEL     Status: Abnormal   Collection Time    10/21/13  5:10 AM      Result Value Ref Range   Sodium 134 (*) 137 - 147 mEq/L   Potassium 4.1  3.7 - 5.3 mEq/L   Chloride 98  96 - 112 mEq/L   CO2 23  19 - 32 mEq/L   Glucose, Bld 120 (*) 70 - 99 mg/dL   BUN 10  6 - 23 mg/dL   Creatinine, Ser 0.43 (*) 0.50 - 1.10 mg/dL   Calcium 8.9  8.4 - 10.5 mg/dL   GFR calc non Af Amer >90  >90 mL/min   GFR calc Af Amer >90  >90 mL/min   Comment: (NOTE)     The eGFR has  been calculated using the CKD EPI equation.     This calculation has not been validated in all clinical situations.     eGFR's persistently <90 mL/min signify possible Chronic Kidney     Disease.   Anion gap 13  5 - 15  CBC     Status: Abnormal   Collection Time    10/21/13  5:10 AM      Result Value Ref Range   WBC 23.7 (*) 4.0 - 10.5 K/uL   RBC 3.09 (*) 3.87 - 5.11 MIL/uL   Hemoglobin 7.0 (*) 12.0 - 15.0 g/dL   HCT 23.7 (*) 36.0 - 46.0 %   MCV 76.7 (*) 78.0 - 100.0 fL   MCH 22.7 (*) 26.0 - 34.0 pg   MCHC 29.5 (*) 30.0 - 36.0 g/dL   RDW 20.4 (*) 11.5 - 15.5 %   Platelets 542 (*) 150 - 400 K/uL  PROTIME-INR     Status: None   Collection Time    10/21/13  5:10 AM      Result Value Ref Range   Prothrombin Time 14.2  11.6 - 15.2 seconds   INR 1.10  0.00 - 1.49  APTT     Status: None   Collection Time    10/21/13  5:10 AM      Result Value Ref Range   aPTT 35  24 - 37 seconds  LACTATE DEHYDROGENASE     Status: Abnormal   Collection Time    10/21/13  5:10 AM      Result Value Ref Range   LDH 428 (*) 94 - 250 U/L  PROTEIN, TOTAL     Status: None   Collection Time    10/21/13  5:10 AM      Result Value Ref Range   Total Protein 7.5  6.0 -  8.3 g/dL  GLUCOSE, CAPILLARY     Status: Abnormal   Collection Time    10/21/13  5:35 AM      Result Value Ref Range   Glucose-Capillary 118 (*) 70 - 99 mg/dL  IRON AND TIBC     Status: Abnormal   Collection Time    10/21/13 10:20 AM      Result Value Ref Range   Iron 33 (*) 42 - 135 ug/dL   TIBC 196 (*) 250 - 470 ug/dL   Saturation Ratios 17 (*) 20 - 55 %   UIBC 163  125 - 400 ug/dL   Comment: Performed at [facility]  VITAMIN B12     Status: Abnormal   Collection Time    10/21/13 10:20 AM      Result Value Ref Range   Vitamin B-12 1135 (*) 211 - 911 pg/mL   Comment: Performed at [facility], ROUTINE-ABSCESS     Status: None   Collection Time    10/21/13 10:58 AM      Result Value Ref Range   Specimen Description PERITONEAL CAVITY PERISPLENIC     Special Requests NONE     Gram Stain       Value: FEW WBC PRESENT,BOTH PMN AND MONONUCLEAR     NO ORGANISMS SEEN     Performed at [facility]   Culture       Value: NO GROWTH 1 DAY     Performed at [facility]   Report Status PENDING    BODY FLUID CULTURE     Status: None   Collection Time    10/21/13  10:58 AM      Result Value Ref Range   Specimen Description PERITONEAL CAVITY PELVIC     Special Requests NONE     Gram Stain       Value: ABUNDANT WBC PRESENT,BOTH PMN AND MONONUCLEAR     NO ORGANISMS SEEN     Performed at [facility]   Culture       Value: NO GROWTH 1 DAY     Performed at [facility]   Report Status PENDING    GLUCOSE, CAPILLARY     Status: Abnormal   Collection Time    10/21/13 11:55 AM      Result Value Ref Range   Glucose-Capillary 121 (*) 70 - 99 mg/dL  FUNGUS CULTURE W SMEAR     Status: None   Collection Time    10/21/13  3:30 PM      Result Value Ref Range   Specimen Description PLEURAL     Special Requests Normal     Fungal Smear       Value: NO YEAST OR FUNGAL ELEMENTS SEEN     Performed at [facility]   Culture        Value: CULTURE IN PROGRESS FOR FOUR WEEKS     Performed at [facility]   Report Status PENDING    LACTATE DEHYDROGENASE, BODY FLUID     Status: Abnormal   Collection Time    10/21/13  3:49 PM      Result Value Ref Range   LD, Fluid 392 (*) 3 - 23 U/L   Comment: Performed at [facility]   Fluid Type-FLDH PLEURAL     Comment: CORRECTED ON 07/28 AT 1609: PREVIOUSLY REPORTED AS Pleural Fld  PROTEIN, BODY FLUID     Status: None   Collection Time    10/21/13  3:49 PM      Result Value Ref Range   Total protein, fluid 5.3     Comment: NO NORMAL RANGE ESTABLISHED FOR THIS TEST     Performed at [facility]   Fluid Type-FTP PLEURAL     Comment: CORRECTED ON 07/28 AT 1608: PREVIOUSLY REPORTED AS Pleural Fld  BODY FLUID CELL COUNT WITH DIFFERENTIAL     Status: Abnormal   Collection Time    10/21/13  3:49 PM      Result Value Ref Range   Fluid Type-FCT PLEURAL     Comment: CORRECTED ON 07/28 AT 1609: PREVIOUSLY REPORTED AS Body Fluid   Color, Fluid AMBER (*) YELLOW   Appearance, Fluid CLOUDY (*) CLEAR   WBC, Fluid UNABLE TO PERFORM COUNT DUE TO CLOT IN SPECIMEN  0 - 1000 cu mm   Neutrophil Count, Fluid 39 (*) 0 - 25 %   Lymphs, Fluid 49     Monocyte-Macrophage-Serous Fluid 10 (*) 50 - 90 %   Eos, Fluid 2     Other Cells, Fluid CORRELATE WITH CYTOLOGY.    AFB CULTURE WITH SMEAR     Status: None   Collection Time    10/21/13  3:49 PM      Result Value Ref Range   Specimen Description PLEURAL     Special Requests Normal     Acid Fast Smear       Value: NO ACID FAST BACILLI SEEN     Performed at [facility]   Culture       Value: CULTURE WILL BE EXAMINED FOR 6 WEEKS BEFORE ISSUING A FINAL  REPORT     Performed at [facility]   Report Status PENDING    BODY FLUID CULTURE     Status: None   Collection Time    10/21/13  3:49 PM      Result Value Ref Range   Specimen Description PLEURAL     Special Requests Normal     Gram Stain PENDING      Culture       Value: NO GROWTH 1 DAY     Performed at [facility]   Report Status PENDING    GLUCOSE, CAPILLARY     Status: Abnormal   Collection Time    10/21/13  5:05 PM      Result Value Ref Range   Glucose-Capillary 122 (*) 70 - 99 mg/dL  GLUCOSE, CAPILLARY     Status: Abnormal   Collection Time    10/21/13 11:54 PM      Result Value Ref Range   Glucose-Capillary 136 (*) 70 - 99 mg/dL  BASIC METABOLIC PANEL     Status: Abnormal   Collection Time    10/22/13  4:10 AM      Result Value Ref Range   Sodium 134 (*) 137 - 147 mEq/L   Potassium 4.3  3.7 - 5.3 mEq/L   Chloride 99  96 - 112 mEq/L   CO2 24  19 - 32 mEq/L   Glucose, Bld 112 (*) 70 - 99 mg/dL   BUN 10  6 - 23 mg/dL   Creatinine, Ser 0.44 (*) 0.50 - 1.10 mg/dL   Calcium 8.9  8.4 - 10.5 mg/dL   GFR calc non Af Amer >90  >90 mL/min   GFR calc Af Amer >90  >90 mL/min   Comment: (NOTE)     The eGFR has been calculated using the CKD EPI equation.     This calculation has not been validated in all clinical situations.     eGFR's persistently <90 mL/min signify possible Chronic Kidney     Disease.   Anion gap 11  5 - 15  CBC     Status: Abnormal   Collection Time    10/22/13  4:10 AM      Result Value Ref Range   WBC 17.5 (*) 4.0 - 10.5 K/uL   RBC 3.04 (*) 3.87 - 5.11 MIL/uL   Hemoglobin 7.2 (*) 12.0 - 15.0 g/dL   HCT 23.6 (*) 36.0 - 46.0 %   MCV 77.6 (*) 78.0 - 100.0 fL   MCH 23.7 (*) 26.0 - 34.0 pg   MCHC 30.5  30.0 - 36.0 g/dL   RDW 20.7 (*) 11.5 - 15.5 %   Platelets 583 (*) 150 - 400 K/uL  URINALYSIS, ROUTINE W REFLEX MICROSCOPIC     Status: None   Collection Time    10/22/13  5:09 AM      Result Value Ref Range   Color, Urine YELLOW  YELLOW   APPearance CLEAR  CLEAR   Specific Gravity, Urine 1.015  1.005 - 1.030   pH 7.0  5.0 - 8.0   Glucose, UA NEGATIVE  NEGATIVE mg/dL   Hgb urine dipstick NEGATIVE  NEGATIVE   Bilirubin Urine NEGATIVE  NEGATIVE   Ketones, ur NEGATIVE  NEGATIVE mg/dL    Protein, ur NEGATIVE  NEGATIVE mg/dL   Urobilinogen, UA 0.2  0.0 - 1.0 mg/dL   Nitrite NEGATIVE  NEGATIVE   Leukocytes, UA NEGATIVE  NEGATIVE   Comment: MICROSCOPIC NOT DONE ON URINES  WITH NEGATIVE PROTEIN, BLOOD, LEUKOCYTES, NITRITE, OR GLUCOSE <1000 mg/dL.  GLUCOSE, CAPILLARY     Status: Abnormal   Collection Time    10/22/13  6:28 AM      Result Value Ref Range   Glucose-Capillary 103 (*) 70 - 99 mg/dL  GLUCOSE, CAPILLARY     Status: Abnormal   Collection Time    10/22/13 11:49 AM      Result Value Ref Range   Glucose-Capillary 116 (*) 70 - 99 mg/dL    Imaging / Studies: Dg Chest 1 View  10/21/2013   CLINICAL DATA:  LEFT thoracentesis.  EXAM: CHEST - 1 VIEW  COMPARISON:  10/20/2013.  FINDINGS: There is no pneumothorax status post LEFT thoracentesis. LEFT pleural effusion and atelectasis remains present. RIGHT basilar atelectasis. RIGHT upper extremity PICC tip terminates at the junction of the superior vena cava and RIGHT atrium. Surgical clips are present in the upper abdomen. Cardiopericardial silhouette is within normal limits.  IMPRESSION: No pneumothorax status post LEFT thoracentesis. LEFT-greater-than-RIGHT bilateral basilar atelectasis and small LEFT pleural effusion.   Electronically Signed   By: Dereck Ligas M.D.   On: 10/21/2013 16:06   Ct Aspiration  10/21/2013   CLINICAL DATA:  Abdominal and pelvic abscesses  EXAM: CT GUIDED ASPIRATION BIOPSY OF ABDOMINAL AND PELVIC ABSCESSES  ANESTHESIA/SEDATION: Four  Mg IV Versed; 100 mcg IV Fentanyl  Total Moderate Sedation Time: 30 minutes.  PROCEDURE: The procedure risks, benefits, and alternatives were explained to the patient. Questions regarding the procedure were encouraged and answered. The patient understands and consents to the procedure.  The abdomen was prepped with Betadinein a sterile fashion, and a sterile drape was applied covering the operative field. A sterile gown and sterile gloves were used for the procedure. Local  anesthesia was provided with 1% Lidocaine.  Under CT guidance, an 18 gauge needle was advanced into the perisplenic fluid collection. 15 cc serous fluid was aspirated and sent for culture.  In the prone position, the right gluteal region was prepped and draped in a sterile fashion. Under CT guidance, a second 18 gauge needle was advanced into the pelvic fluid collection via right trans gluteal approach. 12 cc cloudy reddish fluid was aspirated. Samples were sent for culture.  Imaging with documentation was performed.  Complications: None.  FINDINGS: The series of images demonstrate needle placement into the perisplenic and pelvic fluid collections. Post aspiration imaging demonstrates near resolution of the fluid collections.  IMPRESSION: Successful aspiration of 2 fluid collections as described above. The perisplenic aspiration yielded serous fluid in the pelvic aspiration yielded purulent bloody fluid.   Electronically Signed   By: Maryclare Bean M.D.   On: 10/21/2013 12:26   Ct Aspiration  10/21/2013   CLINICAL DATA:  Abdominal and pelvic abscesses  EXAM: CT GUIDED ASPIRATION BIOPSY OF ABDOMINAL AND PELVIC ABSCESSES  ANESTHESIA/SEDATION: Four  Mg IV Versed; 100 mcg IV Fentanyl  Total Moderate Sedation Time: 30 minutes.  PROCEDURE: The procedure risks, benefits, and alternatives were explained to the patient. Questions regarding the procedure were encouraged and answered. The patient understands and consents to the procedure.  The abdomen was prepped with Betadinein a sterile fashion, and a sterile drape was applied covering the operative field. A sterile gown and sterile gloves were used for the procedure. Local anesthesia was provided with 1% Lidocaine.  Under CT guidance, an 18 gauge needle was advanced into the perisplenic fluid collection. 15 cc serous fluid was aspirated and sent for culture.  In the prone  position, the right gluteal region was prepped and draped in a sterile fashion. Under CT guidance, a  second 18 gauge needle was advanced into the pelvic fluid collection via right trans gluteal approach. 12 cc cloudy reddish fluid was aspirated. Samples were sent for culture.  Imaging with documentation was performed.  Complications: None.  FINDINGS: The series of images demonstrate needle placement into the perisplenic and pelvic fluid collections. Post aspiration imaging demonstrates near resolution of the fluid collections.  IMPRESSION: Successful aspiration of 2 fluid collections as described above. The perisplenic aspiration yielded serous fluid in the pelvic aspiration yielded purulent bloody fluid.   Electronically Signed   By: Maryclare Bean M.D.   On: 10/21/2013 12:26   US Thoracentesis Asp Pleural Space W/img Guide  10/21/2013   CLINICAL DATA:  Left-sided pleural effusion. Request diagnostic and therapeutic thoracentesis.  EXAM: ULTRASOUND GUIDED LEFT THORACENTESIS  COMPARISON:  None.  PROCEDURE: An ultrasound guided thoracentesis was thoroughly discussed with the patient and questions answered. The benefits, risks, alternatives and complications were also discussed. The patient understands and wishes to proceed with the procedure. Written consent was obtained.  Ultrasound was performed to localize and mark an adequate pocket of fluid in the left chest. The area was then prepped and draped in the normal sterile fashion. 1% Lidocaine was used for local anesthesia. Under ultrasound guidance a 19 gauge Yueh catheter was introduced. Thoracentesis was performed. The catheter was removed and a dressing applied.  Complications:  None immediate  FINDINGS: A total of approximately 270 mL of hazy, dark yellow fluid was removed. A fluid sample wassent for laboratory analysis.  IMPRESSION: Successful ultrasound guided left thoracentesis yielding 270 mL of pleural fluid.  Read by: Ascencion Dike PA-C   Electronically Signed   By: Maryclare Bean M.D.   On: 10/21/2013 15:54   Scheduled Meds: . antiseptic oral rinse  15 mL  Mouth Rinse q12n4p  . chlorhexidine  15 mL Mouth Rinse BID  . hydroxychloroquine  200 mg Oral BID  . imipenem-cilastatin  500 mg Intravenous 4 times per day  . insulin aspart  0-9 Units Subcutaneous 4 times per day  . methocarbamol (ROBAXIN) IV  1,000 mg Intravenous Q8H  . metoCLOPramide (REGLAN) injection  5 mg Intravenous 3 times per day  . micafungin [facility]) IV  100 mg Intravenous Daily  . ondansetron (ZOFRAN) IV  4 mg Intravenous 4 times per day  . pantoprazole (PROTONIX) IV  40 mg Intravenous QHS  . predniSONE  5 mg Oral BID WC  . sodium chloride  10-40 mL Intracatheter Q12H  . vancomycin  1,000 mg Intravenous Q8H   Continuous Infusions: . sodium chloride 30 mL/hr at 10/17/13 0514  . Marland KitchenTPN (CLINIMIX-E) Adult 80 mL/hr at 10/21/13 1758   And  . fat emulsion 250 mL (10/21/13 1758)  . Marland KitchenTPN (CLINIMIX-E) Adult     And  . fat emulsion     PRN Meds:.acetaminophen, albuterol, bisacodyl, HYDROmorphone (DILAUDID) injection, lip balm, ondansetron (ZOFRAN) IV, ondansetron, oxyCODONE, phenol, sodium chloride, sodium chloride  Antibiotics: Anti-infectives   Start     Dose/Rate Route Frequency Ordered Stop   10/21/13 1630  micafungin (MYCAMINE) 100 mg in sodium chloride 0.9 % 100 mL IVPB     100 mg 100 mL/hr over 1 Hours Intravenous Daily 10/21/13 1511     10/21/13 1400  fluconazole (DIFLUCAN) IVPB 200 mg  Status:  Discontinued     200 mg 100 mL/hr over 60 Minutes Intravenous Every 24 hours 10/21/13 1207  10/21/13 1511   10/20/13 1800  imipenem-cilastatin (PRIMAXIN) 500 mg in sodium chloride 0.9 % 100 mL IVPB     500 mg 200 mL/hr over 30 Minutes Intravenous 4 times per day 10/20/13 1536     10/20/13 1000  hydroxychloroquine (PLAQUENIL) tablet 200 mg     200 mg Oral 2 times daily 10/20/13 0906     10/19/13 2200  vancomycin (VANCOCIN) IVPB 1000 mg/200 mL premix     1,000 mg 200 mL/hr over 60 Minutes Intravenous Every 8 hours 10/19/13 1402     10/17/13 2315  vancomycin (VANCOCIN) 1,250  mg in sodium chloride 0.9 % 250 mL IVPB  Status:  Discontinued     1,250 mg 166.7 mL/hr over 90 Minutes Intravenous 3 times per day 10/17/13 2303 10/19/13 1337   10/16/13 0830  vancomycin (VANCOCIN) IVPB 1000 mg/200 mL premix  Status:  Discontinued     1,000 mg 200 mL/hr over 60 Minutes Intravenous 3 times per day 10/16/13 0730 10/17/13 2303   10/14/13 2200  vancomycin (VANCOCIN) IVPB 750 mg/150 ml premix  Status:  Discontinued     750 mg 150 mL/hr over 60 Minutes Intravenous Every 8 hours 10/14/13 1158 10/16/13 0730   10/14/13 1400  ceFEPIme (MAXIPIME) 1 g in dextrose 5 % 50 mL IVPB  Status:  Discontinued     1 g 100 mL/hr over 30 Minutes Intravenous 3 times per day 10/14/13 1125 10/20/13 1536   10/14/13 1400  vancomycin (VANCOCIN) 1,500 mg in sodium chloride 0.9 % 500 mL IVPB     1,500 mg 250 mL/hr over 120 Minutes Intravenous  Once 10/14/13 1158 10/14/13 1658   10/12/13 1600  levofloxacin (LEVAQUIN) IVPB 500 mg  Status:  Discontinued     500 mg 100 mL/hr over 60 Minutes Intravenous Every 24 hours 10/12/13 1545 10/14/13 1124   10/10/13 1230  ceFAZolin (ANCEF) IVPB 2 g/50 mL premix     2 g 100 mL/hr over 30 Minutes Intravenous  Once 10/10/13 1220 10/10/13 1222   10/10/13 0600  [MAR Hold]  ceFAZolin (ANCEF) IVPB 2 g/50 mL premix     (On MAR Hold since 10/10/13 0659)   2 g 100 mL/hr over 30 Minutes Intravenous On call to O.R. 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   -Encourage incentive spirometer, ambulation, SCD's  -Continue JP drain  -continue with atbx, micafungin    Erby Pian, [facility] Surgery Pager [phone] Office [phone]  10/22/2013 1:31 PM

## 2013-10-22 NOTE — Progress Notes (Signed)
BRIEF PHARMACY NOTE  7/29 1300 Vancomycin trough = 17.7 (therapeutic)   Goal of Therapy:  Vancomycin trough level 15-20 mcg/ml  Appropriate abx dosing, eradication of infection.   Plan: Continue Vancomycin 1g IV Q8H as trough level is therapeutic   [name], PharmD Pager: [phone] 10/22/2013 1:47 PM

## 2013-10-22 NOTE — Progress Notes (Signed)
Physical Therapy Treatment Patient Details Name: Andrea Best MRN: [id number] DOB: 1970-08-29 Today's Date: 10-31-13    History of Present Illness 43 y.o. 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 Pt s/p subtotal gastrectomy with porta hepatis and peripancreatic lymph node dissection with a Roux-en-Y gastrojejunostomy on 10/10/13.  Pt s/p  left thoracentesis 7/28    PT Comments    Pt reports feeling better today and ambulated good distance in hallway.  Pt progressing well towards goals and anticipate likely one more session for ambulation and stairs.  Pt encouraged to ambulate with staff tomorrow.  Follow Up Recommendations  No PT follow up     Equipment Recommendations  None recommended by PT    Recommendations for Other Services       Precautions / Restrictions Precautions Precautions: Fall Precaution Comments: R drain    Mobility  Bed Mobility Overal bed mobility: Modified Independent             General bed mobility comments: increased time  Transfers Overall transfer level: Modified independent Equipment used: None Transfers: Sit to/from Stand              Ambulation/Gait Ambulation/Gait assistance: Supervision Ambulation Distance (Feet): 800 Feet Assistive device: None (IV pole) Gait Pattern/deviations: Step-through pattern Gait velocity: decr   General Gait Details: Pt ambulated with IV pole 200 feet then with no UE support, no unsteadiness or LOB observed, HR continues to be elevated with activity up to 146 bpm today, RN aware   Stairs            Wheelchair Mobility    Modified Rankin (Stroke Patients Only)       Balance                                    Cognition Arousal/Alertness: Awake/alert Behavior During Therapy: WFL for tasks assessed/performed Overall  Cognitive Status: Within Functional Limits for tasks assessed                      Exercises      General Comments        Pertinent Vitals/Pain 8/10 abdominal pain after mobility, RN in to give pain meds, repositioned    Home Living                      Prior Function            PT Goals (current goals can now be found in the care plan section) Progress towards PT goals: Progressing toward goals    Frequency  Min 3X/week    PT Plan Current plan remains appropriate    Co-evaluation             End of Session   Activity Tolerance: Patient tolerated treatment well Patient left: with call bell/phone within reach;in chair;with nursing/sitter in room (with RN, aware of HR)     Time: 7078-6754 PT Time Calculation (min): 23 min  Charges:  $Gait Training: 23-37 mins                    G Codes:      Abbagail Scaff,KATHrine E 10/31/13, 1:11 PM Carmelia Bake, PT, DPT 10-31-13 Pager: [phone]

## 2013-10-22 NOTE — Progress Notes (Signed)
Please hang calorie count envelope on the patient's door. Document percent consumed for each item on the patient's meal tray ticket and keep in envelope. Also document percent of any supplement or snack pt consumes and keep documentation in envelope for RD to review.    Yeison Sippel F Tyreona Panjwani MS RD LDN Clinical Dietitian Pager:[phone]  

## 2013-10-22 NOTE — Progress Notes (Addendum)
NUTRITION FOLLOW UP  Intervention: -Recommend Resource Breeze supplement BID as tolerated -Initiated 48 hr Calorie Count  -TPN per pharmacy -Will continue to monitor  Nutrition Dx:   Inadequate oral intake related to nausea/stomach pains as evidenced by PO intake <75%, 16% body weight loss in 4 months; ongoing  Goal:   TPN to meet >/= 90% of their estimated nutrition needs    Monitor:   Diet order, GI profile, TPN tolerance, total protein/energy intake, labs, weights  Assessment:   7/16: -Pt reported unintentional weight loss of 30 lbs since 05/2013.  -Follows vegetarian diet. Eats vegetables, pasta, eggs, dairy and cheese. Has had appetite for past 4 months; however has had to decrease intake d/t stomach pain and nausea post meals. Diet recall indicates pt tolerating liquid diet for past 2-3 weeks d/t inability to tolerate solid foods. Consumes one Boost supplement daily, has been on regimen for one week. Family prepares smoothies with protein powder for additional nutrition  -Pt tolerating < 50% of clear liquid diet. Denied nausea, having small amount of stomach pains  -Was willing to trial [employer] supplement, provided pt with sample, which she tolerated better with soda. Provided pt with information to continue with supplement upon d/c as tolerated  -Pt to have PICC lined placed for initiation of TPN, RN noted possibly to occur later today.  -K low, Phos slightly elevated.  7/23: -Pt s/p subtotal gastrectomy with porta hepatis and peripancreatic lymph node dissection with a Roux-en-Y gastrojejunostomy on 10/10/13 -NGT with 150 ml output, bowel function has not yet returned. Surgery recommends to continue NPO status -Pharmacy noted pt receiving Clinimix 5/15 at goal rate of 80 ml/hr w/Lipids 20% at 10 ml/hr -This provides 1850 kcal (93% est minimum kcal needs, 84% est maximum kcal needs) and 96 gram protein (100% est protein needs) -Will recommend to slightly increase TPN rate  to meet > 90% est kcal needs; pt with decreased albumin/pre-albumin (also likely r/t to inflammatory process/gastric cancer), and MD noted pt with feelings of hunger/thirst -Phos/Mg WNL, K slightly low at 3.6 -CBG's WNL, <150 mg  7/27: -Per pharmacy note, TPN was modified to Clinimix E 5/20 at 80 ml/hr to provide 2196 kcal (100% est kcal needs), and 96 gram protein (100% est protein needs). Appreciate Pharmacy assistance -Phos/K/Mg WNL, Glucose < 150 mg/dl. Prealbumin pending. -LFT elevated, will continue to monitor. Consider cyclical TPN as needed. Will add supplements as diet advancement tolerated. -NGT clamped on 7/26. Was advanced to clears on 7/26, then full liquid on 7/27. Tolerated w/out nausea/vomiting. Receiving Reglan -Pt to undergo CT scan chest/abd/pelvis. Currently NPO.   7/29: -Diet advanced to clear liquid on 7/28. Was advanced to full liquid and progressed to Soft diet w/crackers and toast allowed on 7/29 -Pt had not yet ordered lunch meal during RD follow up -Was willing to incorporate supplements into diet. Preferred Resource Breeze as she tolerated it vs Ensure/Boost. Will order BID; was willing to add in snacks as tolerated. RD to continue to follow. -Calorie Count initiated for 48 hours. Instructions noted -Continues to receive Clinimix E 5/20 at 80 ml/hr as pt with minimal PO intake. Will monitor PO intake and wean off TPN as warranted -Triglycerides increased, Pharm to continue to monitor, recommended to continue with 20% fat emulsion at 10 ml/hr -LFT and total bili elevated, will continue to monitor and encouraged PO intake to wean TPN -Prealbumin improving  Height: Ht Readings from Last 1 Encounters:  10/08/13 5\' 6"  (1.676 m)  Weight Status:   Wt Readings from Last 1 Encounters:  10/18/13 157 lb 3.2 oz (71.305 kg)  10/08/13 154 lbs  Re-estimated needs:  Kcal: 2000-2200  Protein: 85-100 gram  Fluid: >/=2100 ml/daily   Skin: WDL  Diet Order:  Andrea Best   Intake/Output Summary (Last 24 hours) at 10/22/13 1418 Last data filed at 10/22/13 1000  Gross per 24 hour  Intake   3260 ml  Output   2035 ml  Net   1225 ml    Last BM: 7/29- active BS   Labs:   Recent Labs Lab 10/16/13 0530  10/20/13 0500 10/21/13 0510 10/22/13 0410  NA 134*  < > 134* 134* 134*  K 3.6*  < > 3.8 4.1 4.3  CL 99  < > 100 98 99  CO2 26  < > 22 23 24   BUN 7  < > 9 10 10   CREATININE 0.38*  < > 0.47* 0.43* 0.44*  CALCIUM 8.8  < > 8.7 8.9 8.9  MG 2.0  --  1.9  --   --   PHOS 4.4  --  4.5  --   --   GLUCOSE 117*  < > 108* 120* 112*  < > = values in this interval not displayed.  CBG (last 3)   Recent Labs  10/21/13 2354 10/22/13 0628 10/22/13 1149  GLUCAP 136* 103* 116*    Scheduled Meds: . antiseptic oral rinse  15 mL Mouth Rinse q12n4p  . chlorhexidine  15 mL Mouth Rinse BID  . feeding supplement (RESOURCE BREEZE)  1 Container Oral BID BM  . hydroxychloroquine  200 mg Oral BID  . imipenem-cilastatin  500 mg Intravenous 4 times per day  . insulin aspart  0-9 Units Subcutaneous 4 times per day  . methocarbamol (ROBAXIN) IV  1,000 mg Intravenous Q8H  . metoCLOPramide (REGLAN) injection  5 mg Intravenous 3 times per day  . micafungin [facility]) IV  100 mg Intravenous Daily  . ondansetron (ZOFRAN) IV  4 mg Intravenous 4 times per day  . pantoprazole (PROTONIX) IV  40 mg Intravenous QHS  . predniSONE  5 mg Oral BID WC  . sodium chloride  10-40 mL Intracatheter Q12H  . vancomycin  1,000 mg Intravenous Q8H    Continuous Infusions: . sodium chloride 30 mL/hr at 10/17/13 0514  . Marland KitchenTPN (CLINIMIX-E) Adult 80 mL/hr at 10/21/13 1758   And  . fat emulsion 250 mL (10/21/13 1758)  . Marland KitchenTPN (CLINIMIX-E) Adult     And  . fat emulsion      Atlee Abide MS RD LDN Clinical Dietitian [phone]

## 2013-10-22 NOTE — Progress Notes (Signed)
Seen, agree with above.  Leukocytosis improving with antifungal. Advance diet. Calorie counts.  Hope to start weaning off TNA if she does well and then work toward discharge.

## 2013-10-22 NOTE — Progress Notes (Signed)
ANTIBIOTIC CONSULT NOTE - FOLLOW UP  Pharmacy Consult for Vancomycin, Antibiotic renal dose adjustment (Imipenem) Indication: pneumonia  Allergies  Allergen Reactions  . Azithromycin Rash  . Sulfa Antibiotics Rash  . Hydrocodone-Acetaminophen Nausea And Vomiting    Hallucinating   . 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 SHE DOES NOT WANT ANY BLOOD PRODUCTS OR FRACTIONS    Patient Measurements: Height: 5\' 6"  (167.6 cm) Weight: 157 lb 3.2 oz (71.305 kg) IBW/kg (Calculated) : 59.3  Vital Signs: Temp: 98.2 F (36.8 C) (07/29 0628) Temp src: Oral (07/29 0628) BP: 108/61 mmHg (07/29 0628) Pulse Rate: 98 (07/29 0628) Intake/Output from previous day: 07/28 0701 - 07/29 0700 In: 2760 [I.V.:300; IV Piggyback:1740; TPN:720] Out: 1835 [Urine:1820; Drains:15]  Labs:  Recent Labs  10/20/13 0500 10/21/13 0510 10/22/13 0410  WBC 22.1* 23.7* 17.5*  HGB 7.3* 7.0* 7.2*  PLT 514* 542* 583*  CREATININE 0.47* 0.43* 0.44*   Estimated Creatinine Clearance: 94.6 ml/min (by C-G formula based on Cr of 0.44).  Recent Labs  10/19/13 1300  VANCOTROUGH 20.6*     Anti-infectives: 7/19 >> levofloxacin >> 7/21 7/21 >> vancomycin  >> (D9) 7/21 >> cefepime  >>  7/27 >> primaxin >> (D3) 7/28 >> fluconazole >> 7/28 >> micafungin >> (D2) Total Abx: D11  Assessment: 29 YOF with newly diagnosed stage IV gastric adenocarcinoma, s/p subtotal gastrectomy on 7/17; TNA x2 weeks.  Developed HCAP during admission on vanc/imipenem; LLL pleural effusion on CXR not fully drained with thorascentesis on 7/28. Yeast found on JP drain, started on fluconazole but switched to micafungin per ID. Pelvic and perisplenic abscesses drained 7/28, no organisms seen. Currently receiving Primaxin 500 mg IV q6h; vancomycin 1g IV q8h; and micafungin 100 mg IV q24h.  BCx 7/21 x2: NG-F UCx 7/21: NG BCx 7/26 x1: NGTD Fluid Cx (JP drain) 7/27: yeast; fungal Cx taken 7/28, pending Fluid Cx (Abd Abscess) 7/28: NGTD Fluid Cx  (Pleural effusion): 7/28: NGTD AFB Cx 7/28: pending   Tmax: 99.1 w/o antipyretics  WBCs: decreasing, 17.5 (prednisone, plaquenil started 7/27)  Renal function remains excellent: Scr low, normalized CrCl > 90 ml/min (TBW); 87 ml/min (IBW)  Vancomycin dose changed on 7/26; should be at Css by now  Primaxin dosing appropriate for renal function; no adjustment necessary for micafungin  Goal of Therapy:  Vancomycin trough level 15-20 mcg/ml Appropriate abx dosing, eradication of infection.   Plan:   Obtaining vancomycin trough before 1400 dose today; will adjust pending results  Continue Primaxin, micafungin as ordered  Follow up renal fxn and culture results.  Reuel Boom, PharmD [phone] 10/22/2013 8:39 AM

## 2013-10-22 NOTE — Progress Notes (Signed)
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 Azithromycin Rash  . Sulfa Antibiotics Rash  . Hydrocodone-Acetaminophen Nausea And Vomiting    Hallucinating   . 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 SHE DOES NOT WANT ANY BLOOD PRODUCTS OR FRACTIONS   Patient Measurements: Height: 5' 6"  (167.6 cm) Weight: 157 lb 3.2 oz (71.305 kg) IBW/kg (Calculated) : 59.3  Vital Signs: Temp: 98.2 F (36.8 C) (07/29 1610) Temp src: Oral (07/29 0628) BP: 108/61 mmHg (07/29 0628) Pulse Rate: 98 (07/29 0628) Intake/Output from previous day: 07/28 0701 - 07/29 0700 In: 4080 [I.V.:540; IV Piggyback:1740; TPN:1800] Out: 1835 [Urine:1820; Drains:15] Intake/Output from this shift:    Labs:  Recent Labs  10/20/13 0500 10/21/13 0510 10/22/13 0410  WBC 22.1* 23.7* 17.5*  HGB 7.3* 7.0* 7.2*  HCT 23.7* 23.7* 23.6*  PLT 514* 542* 583*  APTT  --  35  --   INR  --  1.10  --     Recent Labs  10/20/13 0500 10/21/13 0510 10/22/13 0410  NA 134* 134* 134*  K 3.8 4.1 4.3  CL 100 98 99  CO2 22 23 24   GLUCOSE 108* 120* 112*  BUN 9 10 10   CREATININE 0.47* 0.43* 0.44*  CALCIUM 8.7 8.9 8.9  MG 1.9  --   --   PHOS 4.5  --   --   PROT 7.7 7.5  --   ALBUMIN 1.8*  --   --   AST 142*  --   --   ALT 131*  --   --   ALKPHOS 481*  --   --   BILITOT <0.2*  --   --   PREALBUMIN 15.2*  --   --   TRIG 130  --   --    Estimated Creatinine Clearance: 94.6 ml/min (by C-G formula based on Cr of 0.44).    Recent Labs  10/21/13 1705 10/21/13 2354 10/22/13 0628  GLUCAP 122* 136* 103*   Medications:  Scheduled:  . antiseptic oral rinse  15 mL Mouth Rinse q12n4p  . chlorhexidine  15 mL Mouth Rinse BID  . hydroxychloroquine  200 mg Oral BID  . imipenem-cilastatin  500 mg Intravenous 4 times per day  . insulin aspart  0-9 Units Subcutaneous 4 times per day  . methocarbamol (ROBAXIN) IV  1,000 mg  Intravenous Q8H  . metoCLOPramide (REGLAN) injection  5 mg Intravenous 3 times per day  . micafungin [facility]) IV  100 mg Intravenous Daily  . ondansetron (ZOFRAN) IV  4 mg Intravenous 4 times per day  . pantoprazole (PROTONIX) IV  40 mg Intravenous QHS  . predniSONE  5 mg Oral BID WC  . sodium chloride  10-40 mL Intracatheter Q12H  . vancomycin  1,000 mg Intravenous Q8H   Infusions:  . sodium chloride 30 mL/hr at 10/17/13 0514  . Marland KitchenTPN (CLINIMIX-E) Adult 80 mL/hr at 10/21/13 1758   And  . fat emulsion 250 mL (10/21/13 1758)     Nutritional Goals:   RD recs (7/23): Kcal: 2000-2200, Protein: 85-100 gram, Fluid: >/=2100 ml/daily  Clinimix 5/20 at a goal rate of 80 ml/hr + lipids to provide: 96 g/day protein, 2169 Kcal/day.  Current nutrition:   Diet: resume full liquids  IVF: NS @ 30 ml/hr  Clinimix + Lipids at goal rate for total of 90 ml/hr  CBGs & Insulin requirements past 24  hours:   CBGs: 136-103  Novolog sensitive scale q4hr - 2 unit  Assessment:  53 YOF presents with N/V, weight loss d/t difficulty eating with recent dx of Gastric cancer. Partially obstructing gastric ulcer on EGD. TPN per pharmacy started 7/16. 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  Clear liquid diet, but does not appear to have any PO intake in past 24 hr  Renal / Hepatic function: SCr remains low/stable, LFTs have increased AST 142, ALT 131. Alk Phos 481. Total bili < 0.2.  Electrolytes: Na low but stable (unable to adjust in premix TNA), Corr Ca 10.8 (sl elevated), other WNL.    Pre-Albumin: 15.2 (7/27), 3.1 (7/20), 7.9 (7/17) - reflects depleted protein synthesis; improving  TG 130 (7/27), 146 (7/20), 94 (7/17), - increased, will continue to monitor  Glucose: At goal < 150.  No hypoglycemia  TPN Access: PICC (placed 7/16)  TPN day 14  Plan:  At 1800 today:  Continue Clinimix E 5/20 at 80 ml/hr; 20% fat emulsion at 10 ml/hr.  Reassess ability to tolerate PO  nutrition tomorrow  TNA to contain standard multivitamins and trace elements daily.  Continue SSI q6h  TNA lab panels on Mondays & Thursdays.  F/u CMP, LFTs tomorrow.   Reuel Boom, PharmD [phone] 10/22/2013 8:52 AM

## 2013-10-22 NOTE — Progress Notes (Addendum)
INFECTIOUS DISEASE PROGRESS NOTE  ID: Andrea Best is a 43 y.o. female with  Principal Problem:   Gastric outlet obstruction Active Problems:   Gastric cancer   Anemia, iron deficiency   Protein-calorie malnutrition, severe   Hypomagnesemia   Hyperphosphatemia   Fever, unspecified   Tachycardia   Leucocytosis   HCAP (healthcare-associated pneumonia)  Subjective: Without complaints  Abtx:  Anti-infectives   Start     Dose/Rate Route Frequency Ordered Stop   10/21/13 1630  micafungin (MYCAMINE) 100 mg in sodium chloride 0.9 % 100 mL IVPB     100 mg 100 mL/hr over 1 Hours Intravenous Daily 10/21/13 1511     10/21/13 1400  fluconazole (DIFLUCAN) IVPB 200 mg  Status:  Discontinued     200 mg 100 mL/hr over 60 Minutes Intravenous Every 24 hours 10/21/13 1207 10/21/13 1511   10/20/13 1800  imipenem-cilastatin (PRIMAXIN) 500 mg in sodium chloride 0.9 % 100 mL IVPB     500 mg 200 mL/hr over 30 Minutes Intravenous 4 times per day 10/20/13 1536     10/20/13 1000  hydroxychloroquine (PLAQUENIL) tablet 200 mg     200 mg Oral 2 times daily 10/20/13 0906     10/19/13 2200  vancomycin (VANCOCIN) IVPB 1000 mg/200 mL premix     1,000 mg 200 mL/hr over 60 Minutes Intravenous Every 8 hours 10/19/13 1402     10/17/13 2315  vancomycin (VANCOCIN) 1,250 mg in sodium chloride 0.9 % 250 mL IVPB  Status:  Discontinued     1,250 mg 166.7 mL/hr over 90 Minutes Intravenous 3 times per day 10/17/13 2303 10/19/13 1337   10/16/13 0830  vancomycin (VANCOCIN) IVPB 1000 mg/200 mL premix  Status:  Discontinued     1,000 mg 200 mL/hr over 60 Minutes Intravenous 3 times per day 10/16/13 0730 10/17/13 2303   10/14/13 2200  vancomycin (VANCOCIN) IVPB 750 mg/150 ml premix  Status:  Discontinued     750 mg 150 mL/hr over 60 Minutes Intravenous Every 8 hours 10/14/13 1158 10/16/13 0730   10/14/13 1400  ceFEPIme (MAXIPIME) 1 g in dextrose 5 % 50 mL IVPB  Status:  Discontinued     1 g 100 mL/hr over 30  Minutes Intravenous 3 times per day 10/14/13 1125 10/20/13 1536   10/14/13 1400  vancomycin (VANCOCIN) 1,500 mg in sodium chloride 0.9 % 500 mL IVPB     1,500 mg 250 mL/hr over 120 Minutes Intravenous  Once 10/14/13 1158 10/14/13 1658   10/12/13 1600  levofloxacin (LEVAQUIN) IVPB 500 mg  Status:  Discontinued     500 mg 100 mL/hr over 60 Minutes Intravenous Every 24 hours 10/12/13 1545 10/14/13 1124   10/10/13 1230  ceFAZolin (ANCEF) IVPB 2 g/50 mL premix     2 g 100 mL/hr over 30 Minutes Intravenous  Once 10/10/13 1220 10/10/13 1222   10/10/13 0600  [MAR Hold]  ceFAZolin (ANCEF) IVPB 2 g/50 mL premix     (On MAR Hold since 10/10/13 0659)   2 g 100 mL/hr over 30 Minutes Intravenous On call to O.R. 10/09/13 1244 10/10/13 0743      Medications:  Scheduled: . antiseptic oral rinse  15 mL Mouth Rinse q12n4p  . chlorhexidine  15 mL Mouth Rinse BID  . feeding supplement (RESOURCE BREEZE)  1 Container Oral BID BM  . hydroxychloroquine  200 mg Oral BID  . imipenem-cilastatin  500 mg Intravenous 4 times per day  . insulin aspart  0-9 Units Subcutaneous  4 times per day  . methocarbamol (ROBAXIN) IV  1,000 mg Intravenous Q8H  . metoCLOPramide (REGLAN) injection  5 mg Intravenous 3 times per day  . micafungin [facility]) IV  100 mg Intravenous Daily  . ondansetron (ZOFRAN) IV  4 mg Intravenous 4 times per day  . pantoprazole (PROTONIX) IV  40 mg Intravenous QHS  . predniSONE  5 mg Oral BID WC  . sodium chloride  10-40 mL Intracatheter Q12H  . vancomycin  1,000 mg Intravenous Q8H    Objective: Vital signs in last 24 hours: Temp:  [98.2 F (36.8 C)-99.1 F (37.3 C)] 98.2 F (36.8 C) (07/29 1405) Pulse Rate:  [71-102] 71 (07/29 1405) Resp:  [16-24] 19 (07/29 1405) BP: (97-112)/(47-61) 112/59 mmHg (07/29 1405) SpO2:  [99 %-100 %] 99 % (07/29 1405)   General appearance: alert, cooperative and no distress Resp: clear to auscultation bilaterally Cardio: regular rate and rhythm GI: normal  findings: bowel sounds normal and soft, non-tender  Lab Results  Recent Labs  10/21/13 0510 10/22/13 0410  WBC 23.7* 17.5*  HGB 7.0* 7.2*  HCT 23.7* 23.6*  NA 134* 134*  K 4.1 4.3  CL 98 99  CO2 23 24  BUN 10 10  CREATININE 0.43* 0.44*   Liver Panel  Recent Labs  10/20/13 0500 10/21/13 0510  PROT 7.7 7.5  ALBUMIN 1.8*  --   AST 142*  --   ALT 131*  --   ALKPHOS 481*  --   BILITOT <0.2*  --    Sedimentation Rate No results found for this basename: ESRSEDRATE,  in the last 72 hours C-Reactive Protein No results found for this basename: CRP,  in the last 72 hours  Microbiology: Recent Results (from the past 240 hour(s))  CULTURE, BLOOD (ROUTINE X 2)     Status: None   Collection Time    10/14/13 11:50 AM      Result Value Ref Range Status   Specimen Description BLOOD LEFT ARM   Final   Special Requests BOTTLES DRAWN AEROBIC AND ANAEROBIC [facility] EACH   Final   Culture  Setup Time     Final   Value: 10/14/2013 14:17     Performed at [facility]   Culture     Final   Value: NO GROWTH 5 DAYS     Performed at [facility]   Report Status 10/20/2013 FINAL   Final  CULTURE, BLOOD (ROUTINE X 2)     Status: None   Collection Time    10/14/13 11:53 AM      Result Value Ref Range Status   Specimen Description BLOOD LEFT HAND   Final   Special Requests BOTTLES DRAWN AEROBIC AND ANAEROBIC [facility] EACH   Final   Culture  Setup Time     Final   Value: 10/14/2013 14:17     Performed at [place]     Final   Value: NO GROWTH 5 DAYS     Performed at [facility]   Report Status 10/20/2013 FINAL   Final  URINE CULTURE     Status: None   Collection Time    10/14/13 12:09 PM      Result Value Ref Range Status   Specimen Description URINE, CATHETERIZED   Final   Special Requests NONE   Final   Culture  Setup Time     Final   Value: 10/14/2013 17:48     Performed at [facility]   [place]     Final   Value: NO GROWTH       Performed at [facility]     Final   Value: NO GROWTH     Performed at [facility]   Report Status 10/15/2013 FINAL   Final  CULTURE, EXPECTORATED SPUTUM-ASSESSMENT     Status: None   Collection Time    10/14/13  2:59 PM      Result Value Ref Range Status   Specimen Description SPUTUM   Final   Special Requests NONE   Final   Sputum evaluation     Final   Value: THIS SPECIMEN IS ACCEPTABLE. RESPIRATORY CULTURE REPORT TO FOLLOW.   Report Status 10/14/2013 FINAL   Final  CULTURE, RESPIRATORY (NON-EXPECTORATED)     Status: None   Collection Time    10/14/13  2:59 PM      Result Value Ref Range Status   Specimen Description SPUTUM   Final   Special Requests NONE   Final   Gram Stain     Final   Value: RARE WBC PRESENT, PREDOMINANTLY MONONUCLEAR     FEW SQUAMOUS EPITHELIAL CELLS PRESENT     MODERATE GRAM POSITIVE COCCI IN PAIRS     IN CLUSTERS FEW GRAM POSITIVE RODS     RARE GRAM NEGATIVE RODS   Culture     Final   Value: NORMAL OROPHARYNGEAL FLORA     Performed at [facility]   Report Status 10/16/2013 FINAL   Final  CULTURE, BLOOD (ROUTINE X 2)     Status: None   Collection Time    10/19/13  4:55 PM      Result Value Ref Range Status   Specimen Description BLOOD LEFT ARM   Final   Special Requests     Final   Value: BOTTLES DRAWN AEROBIC AND ANAEROBIC 10CC BOTH BOTTLES   Culture  Setup Time     Final   Value: 10/19/2013 22:40     Performed at [facility]   Culture     Final   Value:        BLOOD CULTURE RECEIVED NO GROWTH TO DATE CULTURE WILL BE HELD FOR 5 DAYS BEFORE ISSUING A FINAL NEGATIVE REPORT     Performed at [facility]   Report Status PENDING   Incomplete  BODY FLUID CULTURE     Status: None   Collection Time    10/20/13  5:28 PM      Result Value Ref Range Status   Specimen Description OTHER   Final   Special Requests Normal JP DRAIN IN RIGHT ABDOMEN   Final   Gram Stain     Final   Value: MODERATE  WBC PRESENT,BOTH PMN AND MONONUCLEAR     YEAST     Performed at [facility]   Culture     Final   Value: MODERATE YEAST     Performed at [facility]   Report Status PENDING   Incomplete  CULTURE, ROUTINE-ABSCESS     Status: None   Collection Time    10/21/13 10:58 AM      Result Value Ref Range Status   Specimen Description PERITONEAL CAVITY PERISPLENIC   Final   Special Requests NONE   Final   Gram Stain     Final   Value: FEW WBC PRESENT,BOTH PMN AND MONONUCLEAR     NO ORGANISMS SEEN  Performed at [facility]     Final   Value: NO GROWTH 1 DAY     Performed at [facility]   Report Status PENDING   Incomplete  BODY FLUID CULTURE     Status: None   Collection Time    10/21/13 10:58 AM      Result Value Ref Range Status   Specimen Description PERITONEAL CAVITY PELVIC   Final   Special Requests NONE   Final   Gram Stain     Final   Value: ABUNDANT WBC PRESENT,BOTH PMN AND MONONUCLEAR     NO ORGANISMS SEEN     Performed at [facility]   Culture     Final   Value: NO GROWTH 1 DAY     Performed at [facility]   Report Status PENDING   Incomplete  FUNGUS CULTURE W SMEAR     Status: None   Collection Time    10/21/13  3:30 PM      Result Value Ref Range Status   Specimen Description PLEURAL   Final   Special Requests Normal   Final   Fungal Smear     Final   Value: NO YEAST OR FUNGAL ELEMENTS SEEN     Performed at [facility]   Culture     Final   Value: CULTURE IN PROGRESS FOR FOUR WEEKS     Performed at [facility]   Report Status PENDING   Incomplete  AFB CULTURE WITH SMEAR     Status: None   Collection Time    10/21/13  3:49 PM      Result Value Ref Range Status   Specimen Description PLEURAL   Final   Special Requests Normal   Final   Acid Fast Smear     Final   Value: NO ACID FAST BACILLI SEEN     Performed at [facility]   Culture     Final   Value: CULTURE WILL BE  EXAMINED FOR 6 WEEKS BEFORE ISSUING A FINAL REPORT     Performed at [facility]   Report Status PENDING   Incomplete  BODY FLUID CULTURE     Status: None   Collection Time    10/21/13  3:49 PM      Result Value Ref Range Status   Specimen Description PLEURAL   Final   Special Requests Normal   Final   Gram Stain     Final   Value: FEW WBC PRESENT,BOTH PMN AND MONONUCLEAR     NO ORGANISMS SEEN     Performed at [facility]   Culture     Final   Value: NO GROWTH 1 DAY     Performed at [facility]   Report Status PENDING   Incomplete    Studies/Results: Dg Chest 1 View  10/21/2013   CLINICAL DATA:  LEFT thoracentesis.  EXAM: CHEST - 1 VIEW  COMPARISON:  10/20/2013.  FINDINGS: There is no pneumothorax status post LEFT thoracentesis. LEFT pleural effusion and atelectasis remains present. RIGHT basilar atelectasis. RIGHT upper extremity PICC tip terminates at the junction of the superior vena cava and RIGHT atrium. Surgical clips are present in the upper abdomen. Cardiopericardial silhouette is within normal limits.  IMPRESSION: No pneumothorax status post LEFT thoracentesis. LEFT-greater-than-RIGHT bilateral basilar atelectasis and small LEFT pleural effusion.   Electronically Signed   By: Dereck Ligas M.D.   On: 10/21/2013 16:06  Ct Aspiration  10/21/2013   CLINICAL DATA:  Abdominal and pelvic abscesses  EXAM: CT GUIDED ASPIRATION BIOPSY OF ABDOMINAL AND PELVIC ABSCESSES  ANESTHESIA/SEDATION: Four  Mg IV Versed; 100 mcg IV Fentanyl  Total Moderate Sedation Time: 30 minutes.  PROCEDURE: The procedure risks, benefits, and alternatives were explained to the patient. Questions regarding the procedure were encouraged and answered. The patient understands and consents to the procedure.  The abdomen was prepped with Betadinein a sterile fashion, and a sterile drape was applied covering the operative field. A sterile gown and sterile gloves were used for the procedure. Local  anesthesia was provided with 1% Lidocaine.  Under CT guidance, an 18 gauge needle was advanced into the perisplenic fluid collection. 15 cc serous fluid was aspirated and sent for culture.  In the prone position, the right gluteal region was prepped and draped in a sterile fashion. Under CT guidance, a second 18 gauge needle was advanced into the pelvic fluid collection via right trans gluteal approach. 12 cc cloudy reddish fluid was aspirated. Samples were sent for culture.  Imaging with documentation was performed.  Complications: None.  FINDINGS: The series of images demonstrate needle placement into the perisplenic and pelvic fluid collections. Post aspiration imaging demonstrates near resolution of the fluid collections.  IMPRESSION: Successful aspiration of 2 fluid collections as described above. The perisplenic aspiration yielded serous fluid in the pelvic aspiration yielded purulent bloody fluid.   Electronically Signed   By: Maryclare Bean M.D.   On: 10/21/2013 12:26   Ct Aspiration  10/21/2013   CLINICAL DATA:  Abdominal and pelvic abscesses  EXAM: CT GUIDED ASPIRATION BIOPSY OF ABDOMINAL AND PELVIC ABSCESSES  ANESTHESIA/SEDATION: Four  Mg IV Versed; 100 mcg IV Fentanyl  Total Moderate Sedation Time: 30 minutes.  PROCEDURE: The procedure risks, benefits, and alternatives were explained to the patient. Questions regarding the procedure were encouraged and answered. The patient understands and consents to the procedure.  The abdomen was prepped with Betadinein a sterile fashion, and a sterile drape was applied covering the operative field. A sterile gown and sterile gloves were used for the procedure. Local anesthesia was provided with 1% Lidocaine.  Under CT guidance, an 18 gauge needle was advanced into the perisplenic fluid collection. 15 cc serous fluid was aspirated and sent for culture.  In the prone position, the right gluteal region was prepped and draped in a sterile fashion. Under CT guidance, a  second 18 gauge needle was advanced into the pelvic fluid collection via right trans gluteal approach. 12 cc cloudy reddish fluid was aspirated. Samples were sent for culture.  Imaging with documentation was performed.  Complications: None.  FINDINGS: The series of images demonstrate needle placement into the perisplenic and pelvic fluid collections. Post aspiration imaging demonstrates near resolution of the fluid collections.  IMPRESSION: Successful aspiration of 2 fluid collections as described above. The perisplenic aspiration yielded serous fluid in the pelvic aspiration yielded purulent bloody fluid.   Electronically Signed   By: Maryclare Bean M.D.   On: 10/21/2013 12:26   US Thoracentesis Asp Pleural Space W/img Guide  10/21/2013   CLINICAL DATA:  Left-sided pleural effusion. Request diagnostic and therapeutic thoracentesis.  EXAM: ULTRASOUND GUIDED LEFT THORACENTESIS  COMPARISON:  None.  PROCEDURE: An ultrasound guided thoracentesis was thoroughly discussed with the patient and questions answered. The benefits, risks, alternatives and complications were also discussed. The patient understands and wishes to proceed with the procedure. Written consent was obtained.  Ultrasound was performed to localize  and mark an adequate pocket of fluid in the left chest. The area was then prepped and draped in the normal sterile fashion. 1% Lidocaine was used for local anesthesia. Under ultrasound guidance a 19 gauge Yueh catheter was introduced. Thoracentesis was performed. The catheter was removed and a dressing applied.  Complications:  None immediate  FINDINGS: A total of approximately 270 mL of hazy, dark yellow fluid was removed. A fluid sample wassent for laboratory analysis.  IMPRESSION: Successful ultrasound guided left thoracentesis yielding 270 mL of pleural fluid.  Read by: Ascencion Dike PA-C   Electronically Signed   By: Maryclare Bean M.D.   On: 10/21/2013 15:54     Assessment/Plan: Leukocytosis  Pneumonia    abd abscesses  Body fluid cx (7-27) yeast on stain  Severe protein calorie malnutrition   Total days of antibiotics: 11  levaquin 7-19 -->7-21  Cefepime 7-21 --> 7-27  Vancomycin 7-21 -->  Imipenem 7-27 -->  Diflucan 7-28 --> 7-28 Micafungin 7-28 -->  Doing well,  WBC better await Cx from 7-27 showing yeast.  Drain out 15 yesterday Await pleural fluid Cxs (high LDH) Calorie count, working on improving oral intake         Bobby Rumpf Infectious Diseases (pager) [phone] [email or web address] 10/22/2013, 3:01 PM  LOS: 14 days   **Disclaimer: This note may have been dictated with voice recognition software. Similar sounding words can inadvertently be transcribed and this note may contain transcription errors which may not have been corrected upon publication of note.**

## 2013-10-22 NOTE — Progress Notes (Signed)
   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 CT chest further revealed left lower lobe effusion/pna and PCCM was asked to evaluate.  PMH: Lupus Asthma Sjogren's dz. Eczema    SIGNIFICANT EVENTS / STUDIES:  7/17 subtotal gastrectomy for metastatic cancer 7/28 abdominal abscess drain per IR 7/28 left  thora >>270 >> exudate   LINES / TUBES: Rt picc 7/16>>  CULTURES: 7/26 bc>>ng 7/28 body fluid>> 7/28 pleural fluid>> ANTIBIOTICS: 7/27 diflucan>>7/28 7/28 micafungin >> 7/27 primaxin>> 7/26 vanco>>  SUBJECTIVE: afebrile Denies pain  VITAL SIGNS: Temp:  [98.2 F (36.8 C)-99.1 F (37.3 C)] 98.2 F (36.8 C) (07/29 0628) Pulse Rate:  [98-102] 98 (07/29 0628) Resp:  [16-24] 16 (07/29 0628) BP: (97-110)/(47-61) 108/61 mmHg (07/29 0628) SpO2:  [100 %] 100 % (07/29 0628)  PHYSICAL EXAMINATION: General:  WNWDAAM nad AT REST Neuro:  Intact HEENT: No LAN/JVD, OP unremarkable Cardiovascular: HSR RRR Lungs:  Diminished left base otherwise clear Abdomen:  Rt flank drain noted, suture line intact with staples Musculoskeletal:  intact Skin:  warm   Recent Labs Lab 10/20/13 0500 10/21/13 0510 10/22/13 0410  NA 134* 134* 134*  K 3.8 4.1 4.3  CL 100 98 99  CO2 22 23 24   BUN 9 10 10   CREATININE 0.47* 0.43* 0.44*  GLUCOSE 108* 120* 112*    Recent Labs Lab 10/20/13 0500 10/21/13 0510 10/22/13 0410  HGB 7.3* 7.0* 7.2*  HCT 23.7* 23.7* 23.6*  WBC 22.1* 23.7* 17.5*  PLT 514* 542* 583*   ASSESSMENT / PLAN:  Left pleural effusion in 30 female with subtotal gastrectomy 7/17 for locally advanced adenocarcinoma and  abd abscess drainage 7/28  FLuid appears to be exudate (high LDH, prot) - but lymphocytic predominant - this is more c/w fluid due to lupus or chronic inflammation.  Pan: Follow fluid culture  Antibiotics per ID, agree with broadening to micafungin , would consider her immunocompromised  Consider minimising blood draws now that WBC decreased (pediatric tubes etc) & epogen (she will find out from her church , if allowed )  PCCM to sign off   Andrea Best V. MD  10/22/2013, 11:14 AM

## 2013-10-23 LAB — PHOSPHORUS: PHOSPHORUS: 4 mg/dL (ref 2.3–4.6)

## 2013-10-23 LAB — COMPREHENSIVE METABOLIC PANEL
ALT: 244 U/L — ABNORMAL HIGH (ref 0–35)
ANION GAP: 12 (ref 5–15)
AST: 142 U/L — ABNORMAL HIGH (ref 0–37)
Albumin: 2 g/dL — ABNORMAL LOW (ref 3.5–5.2)
Alkaline Phosphatase: 501 U/L — ABNORMAL HIGH (ref 39–117)
BUN: 10 mg/dL (ref 6–23)
CO2: 24 meq/L (ref 19–32)
CREATININE: 0.48 mg/dL — AB (ref 0.50–1.10)
Calcium: 8.9 mg/dL (ref 8.4–10.5)
Chloride: 98 mEq/L (ref 96–112)
GLUCOSE: 106 mg/dL — AB (ref 70–99)
Potassium: 3.8 mEq/L (ref 3.7–5.3)
Sodium: 134 mEq/L — ABNORMAL LOW (ref 137–147)
Total Bilirubin: 0.2 mg/dL — ABNORMAL LOW (ref 0.3–1.2)
Total Protein: 7.8 g/dL (ref 6.0–8.3)

## 2013-10-23 LAB — CBC
HCT: 23.9 % — ABNORMAL LOW (ref 36.0–46.0)
Hemoglobin: 7.2 g/dL — ABNORMAL LOW (ref 12.0–15.0)
MCH: 23.2 pg — ABNORMAL LOW (ref 26.0–34.0)
MCHC: 30.1 g/dL (ref 30.0–36.0)
MCV: 76.8 fL — ABNORMAL LOW (ref 78.0–100.0)
PLATELETS: 668 10*3/uL — AB (ref 150–400)
RBC: 3.11 MIL/uL — ABNORMAL LOW (ref 3.87–5.11)
RDW: 21.3 % — ABNORMAL HIGH (ref 11.5–15.5)
WBC: 12.8 10*3/uL — ABNORMAL HIGH (ref 4.0–10.5)

## 2013-10-23 LAB — MAGNESIUM: MAGNESIUM: 1.9 mg/dL (ref 1.5–2.5)

## 2013-10-23 LAB — GLUCOSE, CAPILLARY
Glucose-Capillary: 111 mg/dL — ABNORMAL HIGH (ref 70–99)
Glucose-Capillary: 112 mg/dL — ABNORMAL HIGH (ref 70–99)
Glucose-Capillary: 127 mg/dL — ABNORMAL HIGH (ref 70–99)

## 2013-10-23 MED ORDER — TRACE MINERALS CR-CU-F-FE-I-MN-MO-SE-ZN IV SOLN
INTRAVENOUS | Status: DC
  Administered 2013-10-23: 18:00:00 via INTRAVENOUS
  Filled 2013-10-23: qty 2000

## 2013-10-23 MED ORDER — OXYCODONE HCL ER 10 MG PO T12A
10.0000 mg | EXTENDED_RELEASE_TABLET | Freq: Two times a day (BID) | ORAL | Status: DC
  Administered 2013-10-23 – 2013-10-27 (×9): 10 mg via ORAL
  Filled 2013-10-23 (×9): qty 1

## 2013-10-23 MED ORDER — FAT EMULSION 20 % IV EMUL
250.0000 mL | INTRAVENOUS | Status: DC
  Administered 2013-10-23: 250 mL via INTRAVENOUS
  Filled 2013-10-23: qty 250

## 2013-10-23 MED ORDER — INSULIN ASPART 100 UNIT/ML ~~LOC~~ SOLN
0.0000 [IU] | Freq: Three times a day (TID) | SUBCUTANEOUS | Status: DC
  Administered 2013-10-23 – 2013-10-24 (×2): 1 [IU] via SUBCUTANEOUS

## 2013-10-23 MED ORDER — BOOST PLUS PO LIQD
237.0000 mL | Freq: Two times a day (BID) | ORAL | Status: DC
  Administered 2013-10-23 – 2013-10-27 (×6): 237 mL via ORAL
  Filled 2013-10-23 (×9): qty 237

## 2013-10-23 NOTE — Progress Notes (Signed)
Calorie Count Note  48 hour hour calorie count ordered.  Diet: Soft diet Supplements: Resource Breeze po BID, each supplement provides 250 kcal and 9 grams of protein   Breakfast: 80 kcal, 6 gram protein Lunch: 160 kcal, 2 gram protein Dinner: Not ordered Supplements: 250 kcal, 9 gram protein (approximately one Resource Breeze supplement)  Total intake: 490 kcal (25% of minimum estimated needs)  17 gram protein (20% of minimum estimated needs)  Estimated needs: Kcal: 2000-2200  Protein: 85-100 gram  Fluid: >/=2100 ml/daily    Nutrition Dx: Inadequate oral intake related to nausea/stomach pains as evidenced by PO intake <75%, 16% body weight loss in 4 months; ongoing    Goal: Pt to meet >/= 90% of their estimated nutrition needs; progressing    Intervention:  -Pharmacy modified TPN to Clinimix 5/15 at 80 ml/hr d/t rising LFTs.  -Provided pt [employer] supplement, notified RN to encourage PO intake as supplement alternative -Trial Boost BID as pt dislikes [employer] -Will continue to encourage PO intake -RD to follow 7/31   Atlee Abide [place] RD [place] Clinical Dietitian [phone]

## 2013-10-23 NOTE — Progress Notes (Signed)
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 Azithromycin Rash  . Sulfa Antibiotics Rash  . Hydrocodone-Acetaminophen Nausea And Vomiting    Hallucinating   . 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 SHE DOES NOT WANT ANY BLOOD PRODUCTS OR FRACTIONS   Patient Measurements: Height: 5\' 6"  (167.6 cm) Weight: 157 lb 3.2 oz (71.305 kg) IBW/kg (Calculated) : 59.3  Vital Signs: Temp: 98.5 F (36.9 C) (07/30 0521) Temp src: Oral (07/30 0521) BP: 101/45 mmHg (07/30 0521) Pulse Rate: 101 (07/30 0521) Intake/Output from previous day: 07/29 0701 - 07/30 0700 In: 2810 [P.O.:360; I.V.:450; IV Piggyback:1280; TPN:720] Out: 1180 [Urine:1175; Drains:5] Intake/Output from this shift: Total I/O In: 60 [P.O.:60] Out: -   Labs:  Recent Labs  10/21/13 0510 10/22/13 0410 10/23/13 0605  WBC 23.7* 17.5* 12.8*  HGB 7.0* 7.2* 7.2*  HCT 23.7* 23.6* 23.9*  PLT 542* 583* 668*  APTT 35  --   --   INR 1.10  --   --     Recent Labs  10/21/13 0510 10/22/13 0410 10/23/13 0605  NA 134* 134* 134*  K 4.1 4.3 3.8  CL 98 99 98  CO2 23 24 24   GLUCOSE 120* 112* 106*  BUN 10 10 10   CREATININE 0.43* 0.44* 0.48*  CALCIUM 8.9 8.9 8.9  MG  --   --  1.9  PHOS  --   --  4.0  PROT 7.5  --  7.8  ALBUMIN  --   --  2.0*  AST  --   --  142*  ALT  --   --  244*  ALKPHOS  --   --  501*  BILITOT  --   --  0.2*   Estimated Creatinine Clearance: 94.6 ml/min (by C-G formula based on Cr of 0.48).    Recent Labs  10/22/13 1720 10/22/13 2352 10/23/13 0524  GLUCAP 111* 113* 111*   Medications:  Scheduled:  . antiseptic oral rinse  15 mL Mouth Rinse q12n4p  . chlorhexidine  15 mL Mouth Rinse BID  . feeding supplement (RESOURCE BREEZE)  1 Container Oral BID BM  . hydroxychloroquine  200 mg Oral BID  . imipenem-cilastatin  500 mg Intravenous 4 times per day  . insulin aspart  0-9 Units Subcutaneous 4  times per day  . methocarbamol (ROBAXIN) IV  1,000 mg Intravenous Q8H  . metoCLOPramide (REGLAN) injection  5 mg Intravenous 3 times per day  . micafungin [facility]) IV  100 mg Intravenous Daily  . ondansetron (ZOFRAN) IV  4 mg Intravenous 4 times per day  . OxyCODONE  10 mg Oral Q12H  . pantoprazole (PROTONIX) IV  40 mg Intravenous QHS  . predniSONE  5 mg Oral BID WC  . sodium chloride  10-40 mL Intracatheter Q12H  . vancomycin  1,000 mg Intravenous Q8H   Infusions:  . sodium chloride 30 mL/hr at 10/17/13 0514  . Marland KitchenTPN (CLINIMIX-E) Adult 80 mL/hr at 10/22/13 1713   And  . fat emulsion 250 mL (10/22/13 1713)     Nutritional Goals:   RD recs (7/23): Kcal: 2000-2200, Protein: 85-100 gram, Fluid: >/=2100 ml/daily  Clinimix 5/20 at a goal rate of 80 ml/hr + lipids to provide: 96 g/day protein, 2169 Kcal/day.  7/30: change to Clinimix 5/15 at rate of 80 ml/hr + lipids to provide 96g protein and 1843 kcal/day (92% of est calorie goals)  Current nutrition:   Diet: regular diet - currently doing calorie count  IVF: NS @ 30 ml/hr  Clinimix + Lipids at goal rate for total of 90 ml/hr  CBGs & Insulin requirements past 24 hours:   CBGs: stable WNL  Novolog sensitive scale q4hr - 0 unit  Assessment:  89 YOF presents with N/V, weight loss d/t difficulty eating with recent dx of Gastric cancer. Partially obstructing gastric ulcer on EGD. TPN per pharmacy started 7/16. 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  Clear liquid diet, but does not appear to have any PO intake in past 24 hr  Renal / Hepatic function: SCr remains low/stable, LFTs still elevated - AST remained 142 and ALT rose from 131 to 244. AlkP rose from 481 to 501. Total bili remains < 0.2.  Electrolytes: Na low but stable (unable to adjust in premix TNA), other WNL.    Pre-Albumin: 15.2 (7/27), 3.1 (7/20), 7.9 (7/17) - reflects depleted protein synthesis; improving  TG 130 (7/27), 146 (7/20), 94  (7/17), - increased, will continue to monitor  Glucose: At goal < 150.  No hypoglycemia  TPN Access: PICC (placed 7/16)  TPN day 15  Note patient currently on calorie count until tomorrow and if does well plan is to wean TNA at that point and discontinue  Plan:  At 1800 today:  Due to rising LFTs, it is possible that patient is receiving too much dextrose at the current formula of 5/20 - will change patient to 5/15 Clinimix formula with a lower percentage of dextrose at same rate of 80 ml/hr to start tonight at 6pm.  Reassess ability to tolerate PO nutrition tomorrow with plan to stop TNA if patient's calorie count is good per RD  TNA to contain standard multivitamins and trace elements daily.  Change SSI/CBGs to with meals  TNA lab panels on Mondays & Thursdays.  F/u CMP, LFTs tomorrow.   Adrian Saran, PharmD, BCPS Pager [phone] 10/23/2013 11:40 AM

## 2013-10-23 NOTE — Progress Notes (Signed)
[facility] PROGRESS NOTE  Andrea Best OZH:[id number] DOB: March 11, 1971 DOA: 10/08/2013 PCP: Conni Slipper, MD   Brief narrative:  43 y.o. 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 She underwent subtotal gastrectomy with porta hepatis and peripancreatic lymph node dissection with a Roux-en-Y gastrojejunostomy on 10/10/13. Hospital course was complicated due to development of HCAP for which she is on maxipime and vancomycin and now with persistent tachycardia, low grade fever and progressive leucocytosis.  Assessment/Plan: Fever/tachycardia/   leucocytosis/ HCAP  -CXR was suggestive of developing PNA in left lower lobe post op on 7/19. Patient started on Levaquin but changed to maxipime and vanco on 10/14/13. Repeat CXR 10/18/2013 still showing consolidation in the left lower lobe.  -ID consult appreciated . patient ruled out for DVT and PE. CT of chest and abd shows left pleural effusion and fluid collection inferior to spleen and free pelvic fluid.  - Perisplenic and pelvic fluid aspirated by  IR on 7/28 and sent for cx.   Added empiric mycafungin for preliminary finding of yeast on body fluid stain. cx so far negative. . Also had 270 cc dark yellow fluid aspirated from left pleura and sent for analysis. Suggests exudate. cx so far negative -Blood cx  negative. Afebrile and leucocytosis improving.  Continue current abx. 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 Was in SDU post-op and transferred to telemetry... Patient required NG tube post-operatively but removed on 7/25.  Dr. Benay Spice saw the  patient in consultation. Plan on adjuvant chemotherapy.  -added oxycodone for pain control to avoid dilaudid. Patient reports being pain not fully controlled  with oxycodone alone. Will place on oxy ER . Told patient that she would have some pain which will improve slowly and that she would not be discharged home on dialaudid and she understands. Advance diet to soft . Calorie count started on 7/29 with plan on stopping TNA.  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 Check iron panel Hemoglobin ranges from 7-8.   Protein-calorie malnutrition Added resource breeze bid. Diet changed to soft. On TNA.    Lupus/Sjogren syndrome  Plaquenil and prednisone resumed.   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 Will need gyn follow up as outpt  DVT Prophylaxis  Resumed sq lovenox for prophylaxis as Hb has been stable around 7   Code Status: full code Family Communication: none at bedside Disposition Plan: home once off TNA   Consultants:  ID   CCS   Dr Learta Codding  Pulmonary   IR    Procedures:  Subtotal gastrectomy with porta hepatis and peripancreatic lymph node dissection, Roux-en-Y gastrojejunostomy 10/10/13 by Dr. Excell Seltzer.  Aspiration of perisplenic and pelvic fluid on 7/28. Left thoracentesis on 7/28  Antibiotics:  Levaquin 7/19 --> 7/21  Maxipime 7/21 -->  Vancomycin 7/21 -->   HPI/Subjective: Patient reports abdominal pain not improved with oxycodone.   Objective: Filed Vitals:   10/23/13 0521  BP: 101/45  Pulse: 101  Temp: 98.5 F (36.9 C)  Resp: 18    Intake/Output Summary (Last 24 hours) at 10/23/13 0759  Last data filed at 10/23/13 8676  Gross per 24 hour  Intake   2810 ml  Output   1180 ml  Net   1630 ml   Filed Weights   10/12/13 0400 10/13/13 0400  10/18/13 0548  Weight: 74.2 kg (163 lb 9.3 oz) 73.4 kg (161 lb 13.1 oz) 71.305 kg (157 lb 3.2 oz)    Exam:   General:  NAD  HEENT: pallor+,  moist mucosa  Chest: clear to auscultation b/l,   CVS: S1&S2 tachycardic, no Murmurs  Abd: soft, dressing over midline incision with some soakage.  JP drain with milky discharge. BS+, non tender  Ext: warm,    Data Reviewed: Basic Metabolic Panel:  Recent Labs Lab 10/19/13 0405 10/20/13 0500 10/21/13 0510 10/22/13 0410 10/23/13 0605  NA 134* 134* 134* 134* 134*  K 3.8 3.8 4.1 4.3 3.8  CL 100 100 98 99 98  CO2 22 22 23 24 24   GLUCOSE 107* 108* 120* 112* 106*  BUN 9 9 10 10 10   CREATININE 0.48* 0.47* 0.43* 0.44* 0.48*  CALCIUM 8.7 8.7 8.9 8.9 8.9  MG  --  1.9  --   --  1.9  PHOS  --  4.5  --   --  4.0   Liver Function Tests:  Recent Labs Lab 10/20/13 0500 10/21/13 0510 10/23/13 0605  AST 142*  --  142*  ALT 131*  --  244*  ALKPHOS 481*  --  501*  BILITOT <0.2*  --  0.2*  PROT 7.7 7.5 7.8  ALBUMIN 1.8*  --  2.0*    Recent Labs Lab 10/19/13 1655 10/20/13 1214  LIPASE 121* 151*  AMYLASE 173*  --    No results found for this basename: AMMONIA,  in the last 168 hours CBC:  Recent Labs Lab 10/19/13 0405 10/20/13 0500 10/21/13 0510 10/22/13 0410 10/23/13 0605  WBC 18.6* 22.1* 23.7* 17.5* 12.8*  NEUTROABS  --  18.2*  --   --   --   HGB 7.2* 7.3* 7.0* 7.2* 7.2*  HCT 23.2* 23.7* 23.7* 23.6* 23.9*  MCV 73.9* 74.8* 76.7* 77.6* 76.8*  PLT 424* 514* 542* 583* 668*   Cardiac Enzymes:  Recent Labs Lab 10/16/13 2230 10/17/13 0620  TROPONINI <0.30 <0.30   BNP (last 3 results) No results found for this basename: PROBNP,  in the last 8760 hours CBG:  Recent Labs Lab 10/22/13 0628 10/22/13 1149 10/22/13 1720 10/22/13 2352 10/23/13 0524  GLUCAP 103* 116* 111* 113* 111*    Recent Results (from the past 240 hour(s))  CULTURE, BLOOD (ROUTINE X 2)     Status: None   Collection Time    10/14/13 11:50  AM      Result Value Ref Range Status   Specimen Description BLOOD LEFT ARM   Final   Special Requests BOTTLES DRAWN AEROBIC AND ANAEROBIC [facility] EACH   Final   Culture  Setup Time     Final   Value: 10/14/2013 14:17     Performed at [facility]   Culture     Final   Value: NO GROWTH 5 DAYS     Performed at [facility]   Report Status 10/20/2013 FINAL   Final  CULTURE, BLOOD (ROUTINE X 2)     Status: None   Collection Time    10/14/13 11:53 AM      Result Value Ref Range Status   Specimen Description BLOOD LEFT HAND   Final   Special Requests BOTTLES  DRAWN AEROBIC AND ANAEROBIC [facility] EACH   Final   Culture  Setup Time     Final   Value: 10/14/2013 14:17     Performed at [facility]   Culture     Final   Value: NO GROWTH 5 DAYS     Performed at [facility]   Report Status 10/20/2013 FINAL   Final  URINE CULTURE     Status: None   Collection Time    10/14/13 12:09 PM      Result Value Ref Range Status   Specimen Description URINE, CATHETERIZED   Final   Special Requests NONE   Final   Culture  Setup Time     Final   Value: 10/14/2013 17:48     Performed at [place]     Final   Value: NO GROWTH     Performed at [facility]   Culture     Final   Value: NO GROWTH     Performed at [facility]   Report Status 10/15/2013 FINAL   Final  CULTURE, EXPECTORATED SPUTUM-ASSESSMENT     Status: None   Collection Time    10/14/13  2:59 PM      Result Value Ref Range Status   Specimen Description SPUTUM   Final   Special Requests NONE   Final   Sputum evaluation     Final   Value: THIS SPECIMEN IS ACCEPTABLE. RESPIRATORY CULTURE REPORT TO FOLLOW.   Report Status 10/14/2013 FINAL   Final  CULTURE, RESPIRATORY (NON-EXPECTORATED)     Status: None   Collection Time    10/14/13  2:59 PM      Result Value Ref Range Status   Specimen Description SPUTUM   Final   Special Requests NONE   Final   Gram Stain      Final   Value: RARE WBC PRESENT, PREDOMINANTLY MONONUCLEAR     FEW SQUAMOUS EPITHELIAL CELLS PRESENT     MODERATE GRAM POSITIVE COCCI IN PAIRS     IN CLUSTERS FEW GRAM POSITIVE RODS     RARE GRAM NEGATIVE RODS   Culture     Final   Value: NORMAL OROPHARYNGEAL FLORA     Performed at [facility]   Report Status 10/16/2013 FINAL   Final  CULTURE, BLOOD (ROUTINE X 2)     Status: None   Collection Time    10/19/13  4:55 PM      Result Value Ref Range Status   Specimen Description BLOOD LEFT ARM   Final   Special Requests     Final   Value: BOTTLES DRAWN AEROBIC AND ANAEROBIC 10CC BOTH BOTTLES   Culture  Setup Time     Final   Value: 10/19/2013 22:40     Performed at [facility]   Culture     Final   Value:        BLOOD CULTURE RECEIVED NO GROWTH TO DATE CULTURE WILL BE HELD FOR 5 DAYS BEFORE ISSUING A FINAL NEGATIVE REPORT     Performed at [facility]   Report Status PENDING   Incomplete  BODY FLUID CULTURE     Status: None   Collection Time    10/20/13  5:28 PM      Result Value Ref Range Status   Specimen Description OTHER   Final   Special Requests Normal JP DRAIN IN RIGHT ABDOMEN   Final  Gram Stain     Final   Value: MODERATE WBC PRESENT,BOTH PMN AND MONONUCLEAR     YEAST     Performed at [facility]   Culture     Final   Value: MODERATE YEAST     Performed at [facility]   Report Status PENDING   Incomplete  CULTURE, ROUTINE-ABSCESS     Status: None   Collection Time    10/21/13 10:58 AM      Result Value Ref Range Status   Specimen Description PERITONEAL CAVITY PERISPLENIC   Final   Special Requests NONE   Final   Gram Stain     Final   Value: FEW WBC PRESENT,BOTH PMN AND MONONUCLEAR     NO ORGANISMS SEEN     Performed at [facility]   Culture     Final   Value: NO GROWTH 2 DAYS     Performed at [facility]   Report Status PENDING   Incomplete  BODY FLUID CULTURE     Status: None   Collection Time     10/21/13 10:58 AM      Result Value Ref Range Status   Specimen Description PERITONEAL CAVITY PELVIC   Final   Special Requests NONE   Final   Gram Stain     Final   Value: ABUNDANT WBC PRESENT,BOTH PMN AND MONONUCLEAR     NO ORGANISMS SEEN     Performed at [facility]   Culture     Final   Value: NO GROWTH 1 DAY     Performed at [facility]   Report Status PENDING   Incomplete  FUNGUS CULTURE W SMEAR     Status: None   Collection Time    10/21/13  3:30 PM      Result Value Ref Range Status   Specimen Description PLEURAL   Final   Special Requests Normal   Final   Fungal Smear     Final   Value: NO YEAST OR FUNGAL ELEMENTS SEEN     Performed at [facility]   Culture     Final   Value: CULTURE IN PROGRESS FOR FOUR WEEKS     Performed at [facility]   Report Status PENDING   Incomplete  AFB CULTURE WITH SMEAR     Status: None   Collection Time    10/21/13  3:49 PM      Result Value Ref Range Status   Specimen Description PLEURAL   Final   Special Requests Normal   Final   Acid Fast Smear     Final   Value: NO ACID FAST BACILLI SEEN     Performed at [facility]   Culture     Final   Value: CULTURE WILL BE EXAMINED FOR 6 WEEKS BEFORE ISSUING A FINAL REPORT     Performed at [facility]   Report Status PENDING   Incomplete  BODY FLUID CULTURE     Status: None   Collection Time    10/21/13  3:49 PM      Result Value Ref Range Status   Specimen Description PLEURAL   Final   Special Requests Normal   Final   Gram Stain     Final   Value: FEW WBC PRESENT,BOTH PMN AND MONONUCLEAR     NO ORGANISMS SEEN     Performed at [facility]  Final   Value: NO GROWTH 1 DAY     Performed at [facility]   Report Status PENDING   Incomplete     Studies: Dg Chest 1 View  10/21/2013   CLINICAL DATA:  LEFT thoracentesis.  EXAM: CHEST - 1 VIEW  COMPARISON:  10/20/2013.  FINDINGS: There is no  pneumothorax status post LEFT thoracentesis. LEFT pleural effusion and atelectasis remains present. RIGHT basilar atelectasis. RIGHT upper extremity PICC tip terminates at the junction of the superior vena cava and RIGHT atrium. Surgical clips are present in the upper abdomen. Cardiopericardial silhouette is within normal limits.  IMPRESSION: No pneumothorax status post LEFT thoracentesis. LEFT-greater-than-RIGHT bilateral basilar atelectasis and small LEFT pleural effusion.   Electronically Signed   By: Dereck Ligas M.D.   On: 10/21/2013 16:06   Ct Aspiration  10/21/2013   CLINICAL DATA:  Abdominal and pelvic abscesses  EXAM: CT GUIDED ASPIRATION BIOPSY OF ABDOMINAL AND PELVIC ABSCESSES  ANESTHESIA/SEDATION: Four  Mg IV Versed; 100 mcg IV Fentanyl  Total Moderate Sedation Time: 30 minutes.  PROCEDURE: The procedure risks, benefits, and alternatives were explained to the patient. Questions regarding the procedure were encouraged and answered. The patient understands and consents to the procedure.  The abdomen was prepped with Betadinein a sterile fashion, and a sterile drape was applied covering the operative field. A sterile gown and sterile gloves were used for the procedure. Local anesthesia was provided with 1% Lidocaine.  Under CT guidance, an 18 gauge needle was advanced into the perisplenic fluid collection. 15 cc serous fluid was aspirated and sent for culture.  In the prone position, the right gluteal region was prepped and draped in a sterile fashion. Under CT guidance, a second 18 gauge needle was advanced into the pelvic fluid collection via right trans gluteal approach. 12 cc cloudy reddish fluid was aspirated. Samples were sent for culture.  Imaging with documentation was performed.  Complications: None.  FINDINGS: The series of images demonstrate needle placement into the perisplenic and pelvic fluid collections. Post aspiration imaging demonstrates near resolution of the fluid collections.   IMPRESSION: Successful aspiration of 2 fluid collections as described above. The perisplenic aspiration yielded serous fluid in the pelvic aspiration yielded purulent bloody fluid.   Electronically Signed   By: Maryclare Bean M.D.   On: 10/21/2013 12:26   Ct Aspiration  10/21/2013   CLINICAL DATA:  Abdominal and pelvic abscesses  EXAM: CT GUIDED ASPIRATION BIOPSY OF ABDOMINAL AND PELVIC ABSCESSES  ANESTHESIA/SEDATION: Four  Mg IV Versed; 100 mcg IV Fentanyl  Total Moderate Sedation Time: 30 minutes.  PROCEDURE: The procedure risks, benefits, and alternatives were explained to the patient. Questions regarding the procedure were encouraged and answered. The patient understands and consents to the procedure.  The abdomen was prepped with Betadinein a sterile fashion, and a sterile drape was applied covering the operative field. A sterile gown and sterile gloves were used for the procedure. Local anesthesia was provided with 1% Lidocaine.  Under CT guidance, an 18 gauge needle was advanced into the perisplenic fluid collection. 15 cc serous fluid was aspirated and sent for culture.  In the prone position, the right gluteal region was prepped and draped in a sterile fashion. Under CT guidance, a second 18 gauge needle was advanced into the pelvic fluid collection via right trans gluteal approach. 12 cc cloudy reddish fluid was aspirated. Samples were sent for culture.  Imaging with documentation was performed.  Complications: None.  FINDINGS: The series of images  demonstrate needle placement into the perisplenic and pelvic fluid collections. Post aspiration imaging demonstrates near resolution of the fluid collections.  IMPRESSION: Successful aspiration of 2 fluid collections as described above. The perisplenic aspiration yielded serous fluid in the pelvic aspiration yielded purulent bloody fluid.   Electronically Signed   By: Maryclare Bean M.D.   On: 10/21/2013 12:26   US Thoracentesis Asp Pleural Space W/img  Guide  10/21/2013   CLINICAL DATA:  Left-sided pleural effusion. Request diagnostic and therapeutic thoracentesis.  EXAM: ULTRASOUND GUIDED LEFT THORACENTESIS  COMPARISON:  None.  PROCEDURE: An ultrasound guided thoracentesis was thoroughly discussed with the patient and questions answered. The benefits, risks, alternatives and complications were also discussed. The patient understands and wishes to proceed with the procedure. Written consent was obtained.  Ultrasound was performed to localize and mark an adequate pocket of fluid in the left chest. The area was then prepped and draped in the normal sterile fashion. 1% Lidocaine was used for local anesthesia. Under ultrasound guidance a 19 gauge Yueh catheter was introduced. Thoracentesis was performed. The catheter was removed and a dressing applied.  Complications:  None immediate  FINDINGS: A total of approximately 270 mL of hazy, dark yellow fluid was removed. A fluid sample wassent for laboratory analysis.  IMPRESSION: Successful ultrasound guided left thoracentesis yielding 270 mL of pleural fluid.  Read by: Ascencion Dike PA-C   Electronically Signed   By: Maryclare Bean M.D.   On: 10/21/2013 15:54    Scheduled Meds: . antiseptic oral rinse  15 mL Mouth Rinse q12n4p  . chlorhexidine  15 mL Mouth Rinse BID  . feeding supplement (RESOURCE BREEZE)  1 Container Oral BID BM  . hydroxychloroquine  200 mg Oral BID  . imipenem-cilastatin  500 mg Intravenous 4 times per day  . insulin aspart  0-9 Units Subcutaneous 4 times per day  . methocarbamol (ROBAXIN) IV  1,000 mg Intravenous Q8H  . metoCLOPramide (REGLAN) injection  5 mg Intravenous 3 times per day  . micafungin [facility]) IV  100 mg Intravenous Daily  . ondansetron (ZOFRAN) IV  4 mg Intravenous 4 times per day  . pantoprazole (PROTONIX) IV  40 mg Intravenous QHS  . predniSONE  5 mg Oral BID WC  . sodium chloride  10-40 mL Intracatheter Q12H  . vancomycin  1,000 mg Intravenous Q8H   Continuous  Infusions: . sodium chloride 30 mL/hr at 10/17/13 0514  . Marland KitchenTPN (CLINIMIX-E) Adult 80 mL/hr at 10/22/13 1713   And  . fat emulsion 250 mL (10/22/13 1713)      Time spent: 25 minutes    Kazuo Durnil, [place]  [facility] Pager [phone] If 7PM-7AM, please contact night-coverage at [email or web address], password [facility] 10/23/2013, 7:59 AM  LOS: 15 days

## 2013-10-23 NOTE — Progress Notes (Signed)
Patient ID: Andrea Best, female   DOB: 1970/11/27, 43 y.o.   MRN: [id number]  Subjective: Tolerating diet.  BM yesterday.  No n/v.  Still having pain.    Objective:  Vital signs:  Filed Vitals:   10/22/13 0628 10/22/13 1405 10/22/13 2110 10/23/13 0521  BP: 108/61 112/59 115/63 101/45  Pulse: 98 71 118 101  Temp: 98.2 F (36.8 C) 98.2 F (36.8 C) 99.7 F (37.6 C) 98.5 F (36.9 C)  TempSrc: Oral Oral Oral Oral  Resp: _0 Height:      Weight:      SpO2: 100% 99% 98% 99%    Last BM Date: 10/22/13  Intake/Output   Yesterday:  07/29 0701 - 07/30 0700 In: 2810 [P.O.:360; I.V.:450; IV Piggyback:1280; TPN:720] Out: 1180 [Urine:1175; Drains:5] This shift:    I/O last 3 completed shifts: In: 5190 [P.O.:360; I.V.:930; IV Piggyback:2100] Out: 3010 [Urine:2995; Drains:15]   PE:  Gen: Alert, NAD, pleasant  Abd: Soft, mild tenderness, ND, +BS, no HSM, midline incisions with staples in place, in the middle of the incision site 4 staples were removed and packed with wet gauze with dry dressing, no foul smell or purulent drainage was noted (only serosanguinous), drain with whitish tan cloudy purulent drainage   Problem List:   Principal Problem:   Gastric outlet obstruction Active Problems:   Gastric cancer   Anemia, iron deficiency   Protein-calorie malnutrition, severe   Hypomagnesemia   Hyperphosphatemia   Fever, unspecified   Tachycardia   Leucocytosis   HCAP (healthcare-associated pneumonia)    Results:   Labs: Results for orders placed during the hospital encounter of 10/08/13 (from the past 48 hour(s))  CULTURE, ROUTINE-ABSCESS     Status: None   Collection Time    10/21/13 10:58 AM      Result Value Ref Range   Specimen Description PERITONEAL CAVITY PERISPLENIC     Special Requests NONE     Gram Stain       Value: FEW WBC PRESENT,BOTH PMN AND MONONUCLEAR     NO ORGANISMS SEEN     Performed at [facility]   Culture       Value:  NO GROWTH 2 DAYS     Performed at [facility]   Report Status PENDING    BODY FLUID CULTURE     Status: None   Collection Time    10/21/13 10:58 AM      Result Value Ref Range   Specimen Description PERITONEAL CAVITY PELVIC     Special Requests NONE     Gram Stain       Value: ABUNDANT WBC PRESENT,BOTH PMN AND MONONUCLEAR     NO ORGANISMS SEEN     Performed at [facility]   Culture       Value: NO GROWTH 1 DAY     Performed at [facility]   Report Status PENDING    GLUCOSE, CAPILLARY     Status: Abnormal   Collection Time    10/21/13 11:55 AM      Result Value Ref Range   Glucose-Capillary 121 (*) 70 - 99 mg/dL  FUNGUS CULTURE W SMEAR     Status: None   Collection Time    10/21/13  3:30 PM      Result Value Ref Range   Specimen Description PLEURAL     Special Requests Normal     Fungal Smear       Value: NO  YEAST OR FUNGAL ELEMENTS SEEN     Performed at [facility]   Culture       Value: CULTURE IN PROGRESS FOR FOUR WEEKS     Performed at [facility]   Report Status PENDING    LACTATE DEHYDROGENASE, BODY FLUID     Status: Abnormal   Collection Time    10/21/13  3:49 PM      Result Value Ref Range   LD, Fluid 392 (*) 3 - 23 U/L   Comment: Performed at [facility]   Fluid Type-FLDH PLEURAL     Comment: CORRECTED ON 07/28 AT 1609: PREVIOUSLY REPORTED AS Pleural Fld  PROTEIN, BODY FLUID     Status: None   Collection Time    10/21/13  3:49 PM      Result Value Ref Range   Total protein, fluid 5.3     Comment: NO NORMAL RANGE ESTABLISHED FOR THIS TEST     Performed at [facility]   Fluid Type-FTP PLEURAL     Comment: CORRECTED ON 07/28 AT 1608: PREVIOUSLY REPORTED AS Pleural Fld  BODY FLUID CELL COUNT WITH DIFFERENTIAL     Status: Abnormal   Collection Time    10/21/13  3:49 PM      Result Value Ref Range   Fluid Type-FCT PLEURAL     Comment: CORRECTED ON 07/28 AT 1609: PREVIOUSLY REPORTED AS Body Fluid    Color, Fluid AMBER (*) YELLOW   Appearance, Fluid CLOUDY (*) CLEAR   WBC, Fluid UNABLE TO PERFORM COUNT DUE TO CLOT IN SPECIMEN  0 - 1000 cu mm   Neutrophil Count, Fluid 39 (*) 0 - 25 %   Lymphs, Fluid 49     Monocyte-Macrophage-Serous Fluid 10 (*) 50 - 90 %   Eos, Fluid 2     Other Cells, Fluid CORRELATE WITH CYTOLOGY.    AFB CULTURE WITH SMEAR     Status: None   Collection Time    10/21/13  3:49 PM      Result Value Ref Range   Specimen Description PLEURAL     Special Requests Normal     Acid Fast Smear       Value: NO ACID FAST BACILLI SEEN     Performed at [facility]   Culture       Value: CULTURE WILL BE EXAMINED FOR 6 WEEKS BEFORE ISSUING A FINAL REPORT     Performed at [facility]   Report Status PENDING    BODY FLUID CULTURE     Status: None   Collection Time    10/21/13  3:49 PM      Result Value Ref Range   Specimen Description PLEURAL     Special Requests Normal     Gram Stain       Value: FEW WBC PRESENT,BOTH PMN AND MONONUCLEAR     NO ORGANISMS SEEN     Performed at [facility]   Culture       Value: NO GROWTH 1 DAY     Performed at [facility]   Report Status PENDING    GLUCOSE, CAPILLARY     Status: Abnormal   Collection Time    10/21/13  5:05 PM      Result Value Ref Range   Glucose-Capillary 122 (*) 70 - 99 mg/dL  GLUCOSE, CAPILLARY     Status: Abnormal   Collection Time    10/21/13 11:54 PM  Result Value Ref Range   Glucose-Capillary 136 (*) 70 - 99 mg/dL  BASIC METABOLIC PANEL     Status: Abnormal   Collection Time    10/22/13  4:10 AM      Result Value Ref Range   Sodium 134 (*) 137 - 147 mEq/L   Potassium 4.3  3.7 - 5.3 mEq/L   Chloride 99  96 - 112 mEq/L   CO2 24  19 - 32 mEq/L   Glucose, Bld 112 (*) 70 - 99 mg/dL   BUN 10  6 - 23 mg/dL   Creatinine, Ser 0.44 (*) 0.50 - 1.10 mg/dL   Calcium 8.9  8.4 - 10.5 mg/dL   GFR calc non Af Amer >90  >90 mL/min   GFR calc Af Amer >90  >90 mL/min   Comment:  (NOTE)     The eGFR has been calculated using the CKD EPI equation.     This calculation has not been validated in all clinical situations.     eGFR's persistently <90 mL/min signify possible Chronic Kidney     Disease.   Anion gap 11  5 - 15  CBC     Status: Abnormal   Collection Time    10/22/13  4:10 AM      Result Value Ref Range   WBC 17.5 (*) 4.0 - 10.5 K/uL   RBC 3.04 (*) 3.87 - 5.11 MIL/uL   Hemoglobin 7.2 (*) 12.0 - 15.0 g/dL   HCT 23.6 (*) 36.0 - 46.0 %   MCV 77.6 (*) 78.0 - 100.0 fL   MCH 23.7 (*) 26.0 - 34.0 pg   MCHC 30.5  30.0 - 36.0 g/dL   RDW 20.7 (*) 11.5 - 15.5 %   Platelets 583 (*) 150 - 400 K/uL  URINALYSIS, ROUTINE W REFLEX MICROSCOPIC     Status: None   Collection Time    10/22/13  5:09 AM      Result Value Ref Range   Color, Urine YELLOW  YELLOW   APPearance CLEAR  CLEAR   Specific Gravity, Urine 1.015  1.005 - 1.030   pH 7.0  5.0 - 8.0   Glucose, UA NEGATIVE  NEGATIVE mg/dL   Hgb urine dipstick NEGATIVE  NEGATIVE   Bilirubin Urine NEGATIVE  NEGATIVE   Ketones, ur NEGATIVE  NEGATIVE mg/dL   Protein, ur NEGATIVE  NEGATIVE mg/dL   Urobilinogen, UA 0.2  0.0 - 1.0 mg/dL   Nitrite NEGATIVE  NEGATIVE   Leukocytes, UA NEGATIVE  NEGATIVE   Comment: MICROSCOPIC NOT DONE ON URINES WITH NEGATIVE PROTEIN, BLOOD, LEUKOCYTES, NITRITE, OR GLUCOSE <1000 mg/dL.  GLUCOSE, CAPILLARY     Status: Abnormal   Collection Time    10/22/13  6:28 AM      Result Value Ref Range   Glucose-Capillary 103 (*) 70 - 99 mg/dL  GLUCOSE, CAPILLARY     Status: Abnormal   Collection Time    10/22/13 11:49 AM      Result Value Ref Range   Glucose-Capillary 116 (*) 70 - 99 mg/dL  FERRITIN     Status: Abnormal   Collection Time    10/22/13 12:53 PM      Result Value Ref Range   Ferritin 2599 (*) 10 - 291 ng/mL   Comment: (NOTE)     Result repeated and verified.     Result confirmed by automatic dilution.     Performed at [facility], [place]     Status: None  Collection Time    10/22/13 12:54 PM      Result Value Ref Range   Vancomycin Tr 17.7  10.0 - 20.0 ug/mL  GLUCOSE, CAPILLARY     Status: Abnormal   Collection Time    10/22/13  5:20 PM      Result Value Ref Range   Glucose-Capillary 111 (*) 70 - 99 mg/dL   Comment 1 Documented in Chart     Comment 2 Notify RN    GLUCOSE, CAPILLARY     Status: Abnormal   Collection Time    10/22/13 11:52 PM      Result Value Ref Range   Glucose-Capillary 113 (*) 70 - 99 mg/dL   Comment 1 Notify RN    GLUCOSE, CAPILLARY     Status: Abnormal   Collection Time    10/23/13  5:24 AM      Result Value Ref Range   Glucose-Capillary 111 (*) 70 - 99 mg/dL  COMPREHENSIVE METABOLIC PANEL     Status: Abnormal   Collection Time    10/23/13  6:05 AM      Result Value Ref Range   Sodium 134 (*) 137 - 147 mEq/L   Potassium 3.8  3.7 - 5.3 mEq/L   Chloride 98  96 - 112 mEq/L   CO2 24  19 - 32 mEq/L   Glucose, Bld 106 (*) 70 - 99 mg/dL   BUN 10  6 - 23 mg/dL   Creatinine, Ser 0.48 (*) 0.50 - 1.10 mg/dL   Calcium 8.9  8.4 - 10.5 mg/dL   Total Protein 7.8  6.0 - 8.3 g/dL   Albumin 2.0 (*) 3.5 - 5.2 g/dL   AST 142 (*) 0 - 37 U/L   ALT 244 (*) 0 - 35 U/L   Alkaline Phosphatase 501 (*) 39 - 117 U/L   Total Bilirubin 0.2 (*) 0.3 - 1.2 mg/dL   GFR calc non Af Amer >90  >90 mL/min   GFR calc Af Amer >90  >90 mL/min   Comment: (NOTE)     The eGFR has been calculated using the CKD EPI equation.     This calculation has not been validated in all clinical situations.     eGFR's persistently <90 mL/min signify possible Chronic Kidney     Disease.   Anion gap 12  5 - 15  MAGNESIUM     Status: None   Collection Time    10/23/13  6:05 AM      Result Value Ref Range   Magnesium 1.9  1.5 - 2.5 mg/dL  PHOSPHORUS     Status: None   Collection Time    10/23/13  6:05 AM      Result Value Ref Range   Phosphorus 4.0  2.3 - 4.6 mg/dL  CBC     Status: Abnormal   Collection Time    10/23/13  6:05 AM      Result Value  Ref Range   WBC 12.8 (*) 4.0 - 10.5 K/uL   RBC 3.11 (*) 3.87 - 5.11 MIL/uL   Hemoglobin 7.2 (*) 12.0 - 15.0 g/dL   HCT 23.9 (*) 36.0 - 46.0 %   MCV 76.8 (*) 78.0 - 100.0 fL   MCH 23.2 (*) 26.0 - 34.0 pg   MCHC 30.1  30.0 - 36.0 g/dL   RDW 21.3 (*) 11.5 - 15.5 %   Platelets 668 (*) 150 - 400 K/uL    Imaging / Studies: Dg Chest 1 View  10/21/2013   CLINICAL DATA:  LEFT thoracentesis.  EXAM: CHEST - 1 VIEW  COMPARISON:  10/20/2013.  FINDINGS: There is no pneumothorax status post LEFT thoracentesis. LEFT pleural effusion and atelectasis remains present. RIGHT basilar atelectasis. RIGHT upper extremity PICC tip terminates at the junction of the superior vena cava and RIGHT atrium. Surgical clips are present in the upper abdomen. Cardiopericardial silhouette is within normal limits.  IMPRESSION: No pneumothorax status post LEFT thoracentesis. LEFT-greater-than-RIGHT bilateral basilar atelectasis and small LEFT pleural effusion.   Electronically Signed   By: Dereck Ligas M.D.   On: 10/21/2013 16:06   Ct Aspiration  10/21/2013   CLINICAL DATA:  Abdominal and pelvic abscesses  EXAM: CT GUIDED ASPIRATION BIOPSY OF ABDOMINAL AND PELVIC ABSCESSES  ANESTHESIA/SEDATION: Four  Mg IV Versed; 100 mcg IV Fentanyl  Total Moderate Sedation Time: 30 minutes.  PROCEDURE: The procedure risks, benefits, and alternatives were explained to the patient. Questions regarding the procedure were encouraged and answered. The patient understands and consents to the procedure.  The abdomen was prepped with Betadinein a sterile fashion, and a sterile drape was applied covering the operative field. A sterile gown and sterile gloves were used for the procedure. Local anesthesia was provided with 1% Lidocaine.  Under CT guidance, an 18 gauge needle was advanced into the perisplenic fluid collection. 15 cc serous fluid was aspirated and sent for culture.  In the prone position, the right gluteal region was prepped and draped in a  sterile fashion. Under CT guidance, a second 18 gauge needle was advanced into the pelvic fluid collection via right trans gluteal approach. 12 cc cloudy reddish fluid was aspirated. Samples were sent for culture.  Imaging with documentation was performed.  Complications: None.  FINDINGS: The series of images demonstrate needle placement into the perisplenic and pelvic fluid collections. Post aspiration imaging demonstrates near resolution of the fluid collections.  IMPRESSION: Successful aspiration of 2 fluid collections as described above. The perisplenic aspiration yielded serous fluid in the pelvic aspiration yielded purulent bloody fluid.   Electronically Signed   By: Maryclare Bean M.D.   On: 10/21/2013 12:26   Ct Aspiration  10/21/2013   CLINICAL DATA:  Abdominal and pelvic abscesses  EXAM: CT GUIDED ASPIRATION BIOPSY OF ABDOMINAL AND PELVIC ABSCESSES  ANESTHESIA/SEDATION: Four  Mg IV Versed; 100 mcg IV Fentanyl  Total Moderate Sedation Time: 30 minutes.  PROCEDURE: The procedure risks, benefits, and alternatives were explained to the patient. Questions regarding the procedure were encouraged and answered. The patient understands and consents to the procedure.  The abdomen was prepped with Betadinein a sterile fashion, and a sterile drape was applied covering the operative field. A sterile gown and sterile gloves were used for the procedure. Local anesthesia was provided with 1% Lidocaine.  Under CT guidance, an 18 gauge needle was advanced into the perisplenic fluid collection. 15 cc serous fluid was aspirated and sent for culture.  In the prone position, the right gluteal region was prepped and draped in a sterile fashion. Under CT guidance, a second 18 gauge needle was advanced into the pelvic fluid collection via right trans gluteal approach. 12 cc cloudy reddish fluid was aspirated. Samples were sent for culture.  Imaging with documentation was performed.  Complications: None.  FINDINGS: The series of  images demonstrate needle placement into the perisplenic and pelvic fluid collections. Post aspiration imaging demonstrates near resolution of the fluid collections.  IMPRESSION: Successful aspiration of 2 fluid collections as described above. The perisplenic aspiration yielded  serous fluid in the pelvic aspiration yielded purulent bloody fluid.   Electronically Signed   By: Maryclare Bean M.D.   On: 10/21/2013 12:26   US Thoracentesis Asp Pleural Space W/img Guide  10/21/2013   CLINICAL DATA:  Left-sided pleural effusion. Request diagnostic and therapeutic thoracentesis.  EXAM: ULTRASOUND GUIDED LEFT THORACENTESIS  COMPARISON:  None.  PROCEDURE: An ultrasound guided thoracentesis was thoroughly discussed with the patient and questions answered. The benefits, risks, alternatives and complications were also discussed. The patient understands and wishes to proceed with the procedure. Written consent was obtained.  Ultrasound was performed to localize and mark an adequate pocket of fluid in the left chest. The area was then prepped and draped in the normal sterile fashion. 1% Lidocaine was used for local anesthesia. Under ultrasound guidance a 19 gauge Yueh catheter was introduced. Thoracentesis was performed. The catheter was removed and a dressing applied.  Complications:  None immediate  FINDINGS: A total of approximately 270 mL of hazy, dark yellow fluid was removed. A fluid sample wassent for laboratory analysis.  IMPRESSION: Successful ultrasound guided left thoracentesis yielding 270 mL of pleural fluid.  Read by: Ascencion Dike PA-C   Electronically Signed   By: Maryclare Bean M.D.   On: 10/21/2013 15:54    Scheduled Meds: . antiseptic oral rinse  15 mL Mouth Rinse q12n4p  . chlorhexidine  15 mL Mouth Rinse BID  . feeding supplement (RESOURCE BREEZE)  1 Container Oral BID BM  . hydroxychloroquine  200 mg Oral BID  . imipenem-cilastatin  500 mg Intravenous 4 times per day  . insulin aspart  0-9 Units  Subcutaneous 4 times per day  . methocarbamol (ROBAXIN) IV  1,000 mg Intravenous Q8H  . metoCLOPramide (REGLAN) injection  5 mg Intravenous 3 times per day  . micafungin [facility]) IV  100 mg Intravenous Daily  . ondansetron (ZOFRAN) IV  4 mg Intravenous 4 times per day  . pantoprazole (PROTONIX) IV  40 mg Intravenous QHS  . predniSONE  5 mg Oral BID WC  . sodium chloride  10-40 mL Intracatheter Q12H  . vancomycin  1,000 mg Intravenous Q8H   Continuous Infusions: . sodium chloride 30 mL/hr at 10/17/13 0514  . Marland KitchenTPN (CLINIMIX-E) Adult 80 mL/hr at 10/22/13 1713   And  . fat emulsion 250 mL (10/22/13 1713)   PRN Meds:.acetaminophen, albuterol, bisacodyl, HYDROmorphone (DILAUDID) injection, lip balm, ondansetron (ZOFRAN) IV, ondansetron, oxyCODONE, phenol, sodium chloride, sodium chloride  Antibiotics: Anti-infectives   Start     Dose/Rate Route Frequency Ordered Stop   10/21/13 1630  micafungin (MYCAMINE) 100 mg in sodium chloride 0.9 % 100 mL IVPB     100 mg 100 mL/hr over 1 Hours Intravenous Daily 10/21/13 1511     10/21/13 1400  fluconazole (DIFLUCAN) IVPB 200 mg  Status:  Discontinued     200 mg 100 mL/hr over 60 Minutes Intravenous Every 24 hours 10/21/13 1207 10/21/13 1511   10/20/13 1800  imipenem-cilastatin (PRIMAXIN) 500 mg in sodium chloride 0.9 % 100 mL IVPB     500 mg 200 mL/hr over 30 Minutes Intravenous 4 times per day 10/20/13 1536     10/20/13 1000  hydroxychloroquine (PLAQUENIL) tablet 200 mg     200 mg Oral 2 times daily 10/20/13 0906     10/19/13 2200  vancomycin (VANCOCIN) IVPB 1000 mg/200 mL premix     1,000 mg 200 mL/hr over 60 Minutes Intravenous Every 8 hours 10/19/13 1402     10/17/13 2315  vancomycin (VANCOCIN) 1,250 mg in sodium chloride 0.9 % 250 mL IVPB  Status:  Discontinued     1,250 mg 166.7 mL/hr over 90 Minutes Intravenous 3 times per day 10/17/13 2303 10/19/13 1337   10/16/13 0830  vancomycin (VANCOCIN) IVPB 1000 mg/200 mL premix  Status:   Discontinued     1,000 mg 200 mL/hr over 60 Minutes Intravenous 3 times per day 10/16/13 0730 10/17/13 2303   10/14/13 2200  vancomycin (VANCOCIN) IVPB 750 mg/150 ml premix  Status:  Discontinued     750 mg 150 mL/hr over 60 Minutes Intravenous Every 8 hours 10/14/13 1158 10/16/13 0730   10/14/13 1400  ceFEPIme (MAXIPIME) 1 g in dextrose 5 % 50 mL IVPB  Status:  Discontinued     1 g 100 mL/hr over 30 Minutes Intravenous 3 times per day 10/14/13 1125 10/20/13 1536   10/14/13 1400  vancomycin (VANCOCIN) 1,500 mg in sodium chloride 0.9 % 500 mL IVPB     1,500 mg 250 mL/hr over 120 Minutes Intravenous  Once 10/14/13 1158 10/14/13 1658   10/12/13 1600  levofloxacin (LEVAQUIN) IVPB 500 mg  Status:  Discontinued     500 mg 100 mL/hr over 60 Minutes Intravenous Every 24 hours 10/12/13 1545 10/14/13 1124   10/10/13 1230  ceFAZolin (ANCEF) IVPB 2 g/50 mL premix     2 g 100 mL/hr over 30 Minutes Intravenous  Once 10/10/13 1220 10/10/13 1222   10/10/13 0600  [MAR Hold]  ceFAZolin (ANCEF) IVPB 2 g/50 mL premix     (On MAR Hold since 10/10/13 0659)   2 g 100 mL/hr over 30 Minutes Intravenous On call to O.R. 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

## 2013-10-23 NOTE — Progress Notes (Signed)
Seen, agree with above.    Calorie count.

## 2013-10-23 NOTE — Progress Notes (Signed)
INFECTIOUS DISEASE PROGRESS NOTE  ID: MLISS Andrea Best is a 43 y.o. female with  Principal Problem:   Gastric outlet obstruction Active Problems:   Gastric cancer   Anemia, iron deficiency   Protein-calorie malnutrition, severe   Hypomagnesemia   Hyperphosphatemia   Fever, unspecified   Tachycardia   Leucocytosis   HCAP (healthcare-associated pneumonia)  Subjective: Without complaints  Abtx:  Anti-infectives   Start     Dose/Rate Route Frequency Ordered Stop   10/21/13 1630  micafungin (MYCAMINE) 100 mg in sodium chloride 0.9 % 100 mL IVPB     100 mg 100 mL/hr over 1 Hours Intravenous Daily 10/21/13 1511     10/21/13 1400  fluconazole (DIFLUCAN) IVPB 200 mg  Status:  Discontinued     200 mg 100 mL/hr over 60 Minutes Intravenous Every 24 hours 10/21/13 1207 10/21/13 1511   10/20/13 1800  imipenem-cilastatin (PRIMAXIN) 500 mg in sodium chloride 0.9 % 100 mL IVPB     500 mg 200 mL/hr over 30 Minutes Intravenous 4 times per day 10/20/13 1536     10/20/13 1000  hydroxychloroquine (PLAQUENIL) tablet 200 mg     200 mg Oral 2 times daily 10/20/13 0906     10/19/13 2200  vancomycin (VANCOCIN) IVPB 1000 mg/200 mL premix     1,000 mg 200 mL/hr over 60 Minutes Intravenous Every 8 hours 10/19/13 1402     10/17/13 2315  vancomycin (VANCOCIN) 1,250 mg in sodium chloride 0.9 % 250 mL IVPB  Status:  Discontinued     1,250 mg 166.7 mL/hr over 90 Minutes Intravenous 3 times per day 10/17/13 2303 10/19/13 1337   10/16/13 0830  vancomycin (VANCOCIN) IVPB 1000 mg/200 mL premix  Status:  Discontinued     1,000 mg 200 mL/hr over 60 Minutes Intravenous 3 times per day 10/16/13 0730 10/17/13 2303   10/14/13 2200  vancomycin (VANCOCIN) IVPB 750 mg/150 ml premix  Status:  Discontinued     750 mg 150 mL/hr over 60 Minutes Intravenous Every 8 hours 10/14/13 1158 10/16/13 0730   10/14/13 1400  ceFEPIme (MAXIPIME) 1 g in dextrose 5 % 50 mL IVPB  Status:  Discontinued     1 g 100 mL/hr over 30  Minutes Intravenous 3 times per day 10/14/13 1125 10/20/13 1536   10/14/13 1400  vancomycin (VANCOCIN) 1,500 mg in sodium chloride 0.9 % 500 mL IVPB     1,500 mg 250 mL/hr over 120 Minutes Intravenous  Once 10/14/13 1158 10/14/13 1658   10/12/13 1600  levofloxacin (LEVAQUIN) IVPB 500 mg  Status:  Discontinued     500 mg 100 mL/hr over 60 Minutes Intravenous Every 24 hours 10/12/13 1545 10/14/13 1124   10/10/13 1230  ceFAZolin (ANCEF) IVPB 2 g/50 mL premix     2 g 100 mL/hr over 30 Minutes Intravenous  Once 10/10/13 1220 10/10/13 1222   10/10/13 0600  [MAR Hold]  ceFAZolin (ANCEF) IVPB 2 g/50 mL premix     (On MAR Hold since 10/10/13 0659)   2 g 100 mL/hr over 30 Minutes Intravenous On call to O.R. 10/09/13 1244 10/10/13 0743      Medications:  Scheduled: . antiseptic oral rinse  15 mL Mouth Rinse q12n4p  . chlorhexidine  15 mL Mouth Rinse BID  . feeding supplement (RESOURCE BREEZE)  1 Container Oral BID BM  . hydroxychloroquine  200 mg Oral BID  . imipenem-cilastatin  500 mg Intravenous 4 times per day  . insulin aspart  0-9 Units Subcutaneous  TID WC  . methocarbamol (ROBAXIN) IV  1,000 mg Intravenous Q8H  . metoCLOPramide (REGLAN) injection  5 mg Intravenous 3 times per day  . micafungin [employer]) IV  100 mg Intravenous Daily  . ondansetron (ZOFRAN) IV  4 mg Intravenous 4 times per day  . OxyCODONE  10 mg Oral Q12H  . pantoprazole (PROTONIX) IV  40 mg Intravenous QHS  . predniSONE  5 mg Oral BID WC  . sodium chloride  10-40 mL Intracatheter Q12H  . vancomycin  1,000 mg Intravenous Q8H    Objective: Vital signs in last 24 hours: Temp:  [98.5 F (36.9 C)-100.2 F (37.9 C)] 100.2 F (37.9 C) (07/30 1347) Pulse Rate:  [101-118] 113 (07/30 1347) Resp:  [18-20] 20 (07/30 1347) BP: (101-127)/(45-63) 127/63 mmHg (07/30 1347) SpO2:  [98 %-100 %] 100 % (07/30 1347)   General appearance: alert, cooperative and no distress Resp: clear to auscultation bilaterally Cardio: regular  rate and rhythm GI: normal findings: soft, non-tender and abnormal findings:  hypoactive bowel sounds  Lab Results  Recent Labs  10/22/13 0410 10/23/13 0605  WBC 17.5* 12.8*  HGB 7.2* 7.2*  HCT 23.6* 23.9*  NA 134* 134*  K 4.3 3.8  CL 99 98  CO2 24 24  BUN 10 10  CREATININE 0.44* 0.48*   Liver Panel  Recent Labs  10/21/13 0510 10/23/13 0605  PROT 7.5 7.8  ALBUMIN  --  2.0*  AST  --  142*  ALT  --  244*  ALKPHOS  --  501*  BILITOT  --  0.2*   Sedimentation Rate No results found for this basename: ESRSEDRATE,  in the last 72 hours C-Reactive Protein No results found for this basename: CRP,  in the last 72 hours  Microbiology: Recent Results (from the past 240 hour(s))  CULTURE, BLOOD (ROUTINE X 2)     Status: None   Collection Time    10/14/13 11:50 AM      Result Value Ref Range Status   Specimen Description BLOOD LEFT ARM   Final   Special Requests BOTTLES DRAWN AEROBIC AND ANAEROBIC [facility] EACH   Final   Culture  Setup Time     Final   Value: 10/14/2013 14:17     Performed at [facility]   Culture     Final   Value: NO GROWTH 5 DAYS     Performed at [facility]   Report Status 10/20/2013 FINAL   Final  CULTURE, BLOOD (ROUTINE X 2)     Status: None   Collection Time    10/14/13 11:53 AM      Result Value Ref Range Status   Specimen Description BLOOD LEFT HAND   Final   Special Requests BOTTLES DRAWN AEROBIC AND ANAEROBIC [facility] EACH   Final   Culture  Setup Time     Final   Value: 10/14/2013 14:17     Performed at [facility]   Culture     Final   Value: NO GROWTH 5 DAYS     Performed at [facility]   Report Status 10/20/2013 FINAL   Final  URINE CULTURE     Status: None   Collection Time    10/14/13 12:09 PM      Result Value Ref Range Status   Specimen Description URINE, CATHETERIZED   Final   Special Requests NONE   Final   Culture  Setup Time     Final   Value: 10/14/2013  17:48     Performed at [facility]     Final   Value: NO GROWTH     Performed at [facility]   Culture     Final   Value: NO GROWTH     Performed at [facility]   Report Status 10/15/2013 FINAL   Final  CULTURE, EXPECTORATED SPUTUM-ASSESSMENT     Status: None   Collection Time    10/14/13  2:59 PM      Result Value Ref Range Status   Specimen Description SPUTUM   Final   Special Requests NONE   Final   Sputum evaluation     Final   Value: THIS SPECIMEN IS ACCEPTABLE. RESPIRATORY CULTURE REPORT TO FOLLOW.   Report Status 10/14/2013 FINAL   Final  CULTURE, RESPIRATORY (NON-EXPECTORATED)     Status: None   Collection Time    10/14/13  2:59 PM      Result Value Ref Range Status   Specimen Description SPUTUM   Final   Special Requests NONE   Final   Gram Stain     Final   Value: RARE WBC PRESENT, PREDOMINANTLY MONONUCLEAR     FEW SQUAMOUS EPITHELIAL CELLS PRESENT     MODERATE GRAM POSITIVE COCCI IN PAIRS     IN CLUSTERS FEW GRAM POSITIVE RODS     RARE GRAM NEGATIVE RODS   Culture     Final   Value: NORMAL OROPHARYNGEAL FLORA     Performed at [facility]   Report Status 10/16/2013 FINAL   Final  CULTURE, BLOOD (ROUTINE X 2)     Status: None   Collection Time    10/19/13  4:55 PM      Result Value Ref Range Status   Specimen Description BLOOD LEFT ARM   Final   Special Requests     Final   Value: BOTTLES DRAWN AEROBIC AND ANAEROBIC 10CC BOTH BOTTLES   Culture  Setup Time     Final   Value: 10/19/2013 22:40     Performed at [facility]   Culture     Final   Value:        BLOOD CULTURE RECEIVED NO GROWTH TO DATE CULTURE WILL BE HELD FOR 5 DAYS BEFORE ISSUING A FINAL NEGATIVE REPORT     Performed at [facility]   Report Status PENDING   Incomplete  BODY FLUID CULTURE     Status: None   Collection Time    10/20/13  5:28 PM      Result Value Ref Range Status   Specimen Description OTHER   Final   Special Requests Normal JP DRAIN IN RIGHT  ABDOMEN   Final   Gram Stain     Final   Value: MODERATE WBC PRESENT,BOTH PMN AND MONONUCLEAR     YEAST     Performed at [facility]     Final   Value: MODERATE YEAST     Performed at [facility]   Report Status PENDING   Incomplete  CULTURE, ROUTINE-ABSCESS     Status: None   Collection Time    10/21/13 10:58 AM      Result Value Ref Range Status   Specimen Description PERITONEAL CAVITY PERISPLENIC   Final   Special Requests NONE   Final   Gram Stain     Final   Value: FEW WBC PRESENT,BOTH PMN AND MONONUCLEAR  NO ORGANISMS SEEN     Performed at [facility]   Culture     Final   Value: NO GROWTH 2 DAYS     Performed at [facility]   Report Status PENDING   Incomplete  BODY FLUID CULTURE     Status: None   Collection Time    10/21/13 10:58 AM      Result Value Ref Range Status   Specimen Description PERITONEAL CAVITY PELVIC   Final   Special Requests NONE   Final   Gram Stain     Final   Value: ABUNDANT WBC PRESENT,BOTH PMN AND MONONUCLEAR     NO ORGANISMS SEEN     Performed at [facility]   Culture     Final   Value: NO GROWTH 2 DAYS     Performed at [facility]   Report Status PENDING   Incomplete  FUNGUS CULTURE W SMEAR     Status: None   Collection Time    10/21/13  3:30 PM      Result Value Ref Range Status   Specimen Description PLEURAL   Final   Special Requests Normal   Final   Fungal Smear     Final   Value: NO YEAST OR FUNGAL ELEMENTS SEEN     Performed at [facility]   Culture     Final   Value: CULTURE IN PROGRESS FOR FOUR WEEKS     Performed at [facility]   Report Status PENDING   Incomplete  AFB CULTURE WITH SMEAR     Status: None   Collection Time    10/21/13  3:49 PM      Result Value Ref Range Status   Specimen Description PLEURAL   Final   Special Requests Normal   Final   Acid Fast Smear     Final   Value: NO ACID FAST BACILLI SEEN     Performed at [facility]   Culture     Final   Value: CULTURE WILL BE EXAMINED FOR 6 WEEKS BEFORE ISSUING A FINAL REPORT     Performed at [facility]   Report Status PENDING   Incomplete  BODY FLUID CULTURE     Status: None   Collection Time    10/21/13  3:49 PM      Result Value Ref Range Status   Specimen Description PLEURAL   Final   Special Requests Normal   Final   Gram Stain     Final   Value: FEW WBC PRESENT,BOTH PMN AND MONONUCLEAR     NO ORGANISMS SEEN     Performed at [facility]   Culture     Final   Value: NO GROWTH 2 DAYS     Performed at [facility]   Report Status PENDING   Incomplete    Studies/Results: Dg Chest 1 View  10/21/2013   CLINICAL DATA:  LEFT thoracentesis.  EXAM: CHEST - 1 VIEW  COMPARISON:  10/20/2013.  FINDINGS: There is no pneumothorax status post LEFT thoracentesis. LEFT pleural effusion and atelectasis remains present. RIGHT basilar atelectasis. RIGHT upper extremity PICC tip terminates at the junction of the superior vena cava and RIGHT atrium. Surgical clips are present in the upper abdomen. Cardiopericardial silhouette is within normal limits.  IMPRESSION: No pneumothorax status post LEFT thoracentesis. LEFT-greater-than-RIGHT bilateral basilar atelectasis and small LEFT pleural effusion.   Electronically Signed   By: Cay Schillings  Lamke M.D.   On: 10/21/2013 16:06   US Thoracentesis Asp Pleural Space W/img Guide  10/21/2013   CLINICAL DATA:  Left-sided pleural effusion. Request diagnostic and therapeutic thoracentesis.  EXAM: ULTRASOUND GUIDED LEFT THORACENTESIS  COMPARISON:  None.  PROCEDURE: An ultrasound guided thoracentesis was thoroughly discussed with the patient and questions answered. The benefits, risks, alternatives and complications were also discussed. The patient understands and wishes to proceed with the procedure. Written consent was obtained.  Ultrasound was performed to localize and mark an adequate pocket of fluid in the left  chest. The area was then prepped and draped in the normal sterile fashion. 1% Lidocaine was used for local anesthesia. Under ultrasound guidance a 19 gauge Yueh catheter was introduced. Thoracentesis was performed. The catheter was removed and a dressing applied.  Complications:  None immediate  FINDINGS: A total of approximately 270 mL of hazy, dark yellow fluid was removed. A fluid sample wassent for laboratory analysis.  IMPRESSION: Successful ultrasound guided left thoracentesis yielding 270 mL of pleural fluid.  Read by: Ascencion Dike PA-C   Electronically Signed   By: Maryclare Bean M.D.   On: 10/21/2013 15:54     Assessment/Plan: Leukocytosis  Pneumonia  abd abscesses  Body fluid cx (7-27) yeast on stain  Severe protein calorie malnutrition   Total days of antibiotics: 12 levaquin 7-19 -->7-21  Cefepime 7-21 --> 7-27  Vancomycin 7-21 -->  Imipenem 7-27 -->  Diflucan 7-28 --> 7-28  Micafungin 7-28 -->  WBC continues to improve Drainage decreasing from drain.  Defer to surgery and IR regarding repeat CT and pulling tube. Her Cx are ngtd Continue empiric therapy..... Would aim for 2 weeks anbx, if her CT shows resolution.           Bobby Rumpf Infectious Diseases (pager) [phone] [email or web address] 10/23/2013, 3:07 PM  LOS: 15 days   **Disclaimer: This note may have been dictated with voice recognition software. Similar sounding words can inadvertently be transcribed and this note may contain transcription errors which may not have been corrected upon publication of note.**

## 2013-10-24 LAB — COMPREHENSIVE METABOLIC PANEL
ALK PHOS: 487 U/L — AB (ref 39–117)
ALT: 189 U/L — ABNORMAL HIGH (ref 0–35)
AST: 96 U/L — AB (ref 0–37)
Albumin: 2.2 g/dL — ABNORMAL LOW (ref 3.5–5.2)
Anion gap: 14 (ref 5–15)
BILIRUBIN TOTAL: 0.2 mg/dL — AB (ref 0.3–1.2)
BUN: 11 mg/dL (ref 6–23)
CHLORIDE: 98 meq/L (ref 96–112)
CO2: 23 mEq/L (ref 19–32)
CREATININE: 0.48 mg/dL — AB (ref 0.50–1.10)
Calcium: 9.2 mg/dL (ref 8.4–10.5)
GFR calc Af Amer: >90 mL/min (ref >90)
GFR calc non Af Amer: >90 mL/min (ref >90)
Glucose, Bld: 110 mg/dL — ABNORMAL HIGH (ref 70–99)
POTASSIUM: 4 meq/L (ref 3.7–5.3)
Sodium: 135 mEq/L — ABNORMAL LOW (ref 137–147)
Total Protein: 8.3 g/dL (ref 6.0–8.3)

## 2013-10-24 LAB — RHEUMATOID FACTORS, FLUID: Cortisol #1 (Base): NEGATIVE

## 2013-10-24 LAB — BODY FLUID CULTURE
Culture: NO GROWTH
Special Requests: NORMAL

## 2013-10-24 LAB — CBC
HCT: 25.5 % — ABNORMAL LOW (ref 36.0–46.0)
Hemoglobin: 7.6 g/dL — ABNORMAL LOW (ref 12.0–15.0)
MCH: 23.1 pg — ABNORMAL LOW (ref 26.0–34.0)
MCHC: 29.8 g/dL — ABNORMAL LOW (ref 30.0–36.0)
MCV: 77.5 fL — AB (ref 78.0–100.0)
PLATELETS: 706 10*3/uL — AB (ref 150–400)
RBC: 3.29 MIL/uL — ABNORMAL LOW (ref 3.87–5.11)
RDW: 21.3 % — ABNORMAL HIGH (ref 11.5–15.5)
WBC: 10.9 10*3/uL — ABNORMAL HIGH (ref 4.0–10.5)

## 2013-10-24 LAB — GLUCOSE, CAPILLARY
Glucose-Capillary: 97 mg/dL (ref 70–99)
Glucose-Capillary: 131 mg/dL — ABNORMAL HIGH (ref 70–99)
Glucose-Capillary: 110 mg/dL — ABNORMAL HIGH (ref 70–99)

## 2013-10-24 LAB — CULTURE, ROUTINE-ABSCESS: CULTURE: NO GROWTH

## 2013-10-24 MED ORDER — FAT EMULSION 20 % IV EMUL: 250.0000 mL | INTRAVENOUS | Status: AC

## 2013-10-24 MED ORDER — TRACE MINERALS CR-CU-F-FE-I-MN-MO-SE-ZN IV SOLN
INTRAVENOUS | Status: AC
  Administered 2013-10-24: 18:00:00 via INTRAVENOUS
  Filled 2013-10-24: qty 1000

## 2013-10-24 MED ORDER — FAT EMULSION 20 % IV EMUL
250.0000 mL | INTRAVENOUS | Status: AC
  Administered 2013-10-24: 250 mL via INTRAVENOUS
  Filled 2013-10-24: qty 250

## 2013-10-24 MED ORDER — TRACE MINERALS CR-CU-F-FE-I-MN-MO-SE-ZN IV SOLN: INTRAVENOUS | Status: AC

## 2013-10-24 NOTE — Progress Notes (Signed)
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 Azithromycin Rash  . Sulfa Antibiotics Rash  . Hydrocodone-Acetaminophen Nausea And Vomiting    Hallucinating   . 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 SHE DOES NOT WANT ANY BLOOD PRODUCTS OR FRACTIONS   Patient Measurements: Height: 5\' 6"  (167.6 cm) Weight: 157 lb 3.2 oz (71.305 kg) IBW/kg (Calculated) : 59.3  Vital Signs: Temp: 99.6 F (37.6 C) (07/31 0529) Temp src: Oral (07/31 0529) BP: 107/66 mmHg (07/31 0529) Pulse Rate: 102 (07/31 0529) Intake/Output from previous day: 07/30 0701 - 07/31 0700 In: 1500 [P.O.:60; I.V.:360; IV Piggyback:1080] Out: 1900 [Urine:1900] Intake/Output from this shift: Total I/O In: 120 [P.O.:120] Out: 300 [Urine:300]  Labs:  Recent Labs  10/22/13 0410 10/23/13 0605 10/24/13 0450  WBC 17.5* 12.8* 10.9*  HGB 7.2* 7.2* 7.6*  HCT 23.6* 23.9* 25.5*  PLT 583* 668* 706*    Recent Labs  10/22/13 0410 10/23/13 0605 10/24/13 0450  NA 134* 134* 135*  K 4.3 3.8 4.0  CL 99 98 98  CO2 24 24 23   GLUCOSE 112* 106* 110*  BUN 10 10 11   CREATININE 0.44* 0.48* 0.48*  CALCIUM 8.9 8.9 9.2  MG  --  1.9  --   PHOS  --  4.0  --   PROT  --  7.8 8.3  ALBUMIN  --  2.0* 2.2*  AST  --  142* 96*  ALT  --  244* 189*  ALKPHOS  --  501* 487*  BILITOT  --  0.2* 0.2*   Estimated Creatinine Clearance: 94.6 ml/min (by C-G formula based on Cr of 0.48).    Recent Labs  10/23/13 1640 10/24/13 0729 10/24/13 1127  GLUCAP 127* 97 131*   Medications:  Scheduled:  . antiseptic oral rinse  15 mL Mouth Rinse q12n4p  . chlorhexidine  15 mL Mouth Rinse BID  . hydroxychloroquine  200 mg Oral BID  . imipenem-cilastatin  500 mg Intravenous 4 times per day  . insulin aspart  0-9 Units Subcutaneous TID WC  . lactose free nutrition  237 mL Oral BID BM  . methocarbamol (ROBAXIN) IV  1,000 mg Intravenous Q8H  .  metoCLOPramide (REGLAN) injection  5 mg Intravenous 3 times per day  . micafungin [facility]) IV  100 mg Intravenous Daily  . ondansetron (ZOFRAN) IV  4 mg Intravenous 4 times per day  . OxyCODONE  10 mg Oral Q12H  . pantoprazole (PROTONIX) IV  40 mg Intravenous QHS  . predniSONE  5 mg Oral BID WC  . sodium chloride  10-40 mL Intracatheter Q12H  . vancomycin  1,000 mg Intravenous Q8H   Infusions:  . sodium chloride 30 mL/hr at 10/17/13 0514  . Marland KitchenTPN (CLINIMIX-E) Adult 80 mL/hr at 10/23/13 1742   And  . fat emulsion 250 mL (10/23/13 1741)     Nutritional Goals:   RD recs (7/23): Kcal: 2000-2200, Protein: 85-100 gram, Fluid: >/=2100 ml/daily  Clinimix 5/20 at a goal rate of 80 ml/hr + lipids to provide: 96 g/day protein, 2169 Kcal/day.  7/30: change to Clinimix 5/15 at rate of 80 ml/hr + lipids to provide 96g protein and 1843 kcal/day (92% of est calorie goals)  Current nutrition:   Diet: regular diet - currently doing calorie count  IVF: NS @ 30 ml/hr  Clinimix + Lipids at goal rate for total of 90 ml/hr  CBGs &  Insulin requirements past 24 hours:   CBGs: stable WNL  Novolog sensitive scale q4hr - 0 unit  Assessment:  89 YOF presents with N/V, weight loss d/t difficulty eating with recent dx of Gastric cancer. Partially obstructing gastric ulcer on EGD. TPN per pharmacy started 7/16. 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  Clear liquid diet, but does not appear to have any PO intake in past 24 hr  Renal / Hepatic function: SCr remains low/stable, LFTs still elevated but improved from yesterday. AlkP improved a little. Total bili remains 0.2.  Electrolytes: Na low but stable (unable to adjust in premix TNA), other WNL.    Pre-Albumin: 15.2 (7/27), 3.1 (7/20), 7.9 (7/17) - reflects depleted protein synthesis; improving  TG 130 (7/27), 146 (7/20), 94 (7/17)  Glucose: At goal < 150.  No hypoglycemia  TPN Access: PICC (placed 7/16)  TPN day  16  Note patient currently on calorie count - not currently eating much but plan now is start weaning TNA now in effort to maybe stimulate patient's diet  Plan:  At 1800 today:  As stated above, in effort to hopefully stimulate patient's diet, will start weaning TNA now and hopefully will be able to discontinue soon as she starts eating more. Will continue Clinimix E 5/15 but will reduce rate from 80 to 40 ml/hr now and with new bag at 6pm. Continue lipids at 10 ml/hr  TNA to contain standard multivitamins and trace elements daily.  Change SSI/CBGs to with meals  TNA lab panels on Mondays & Thursdays.  F/u LFTs tomorrow.   Adrian Saran, PharmD, BCPS Pager [phone] 10/24/2013 12:44 PM

## 2013-10-24 NOTE — Progress Notes (Signed)
Patient ID: Andrea Best, female   DOB: 1970/12/28, 43 y.o.   MRN: [id number]  Subjective: Appetite is okay, feels full easily.  Per RD PO intake is inadequate.  Meeting 25% of daily goals.   Objective:  Vital signs:  Filed Vitals:   10/23/13 0521 10/23/13 1347 10/23/13 2215 10/24/13 0529  BP: 101/45 127/63 107/61 107/66  Pulse: 101 113 110 102  Temp: 98.5 F (36.9 C) 100.2 F (37.9 C) 99.8 F (37.7 C) 99.6 F (37.6 C)  TempSrc: Oral Oral Oral Oral  Resp: _0 Height:      Weight:      SpO2: 99% 100% 100% 100%    Last BM Date: 10/22/13  Intake/Output   Yesterday:  07/30 0701 - 07/31 0700 In: 1500 [P.O.:60; I.V.:360; IV Piggyback:1080] Out: 1900 [Urine:1900] This shift: I/O last 3 completed shifts: In: 2940 [P.O.:420; I.V.:720; IV Piggyback:1800] Out: 2630 [Urine:2625; Drains:5] Total I/O In: 120 [P.O.:120] Out: 300 [Urine:300]   PE:  Gen: Alert, NAD, pleasant  Abd: Soft, mild tenderness, ND, +BS, no HSM, midline incisions with staples in place, in the middle of the incision site 4 staples were removed and packed with wet gauze with dry dressing, no foul smell or purulent drainage was noted (only serosanguinous), drain with whitish tan cloudy purulent drainage    Problem List:   Principal Problem:   Gastric outlet obstruction Active Problems:   Gastric cancer   Anemia, iron deficiency   Protein-calorie malnutrition, severe   Hypomagnesemia   Hyperphosphatemia   Fever, unspecified   Tachycardia   Leucocytosis   HCAP (healthcare-associated pneumonia)    Results:   Labs: Results for orders placed during the hospital encounter of 10/08/13 (from the past 48 hour(s))  GLUCOSE, CAPILLARY     Status: Abnormal   Collection Time    10/22/13 11:49 AM      Result Value Ref Range   Glucose-Capillary 116 (*) 70 - 99 mg/dL  FERRITIN     Status: Abnormal   Collection Time    10/22/13 12:53 PM      Result Value Ref Range   Ferritin 2599 (*) 10  - 291 ng/mL   Comment: (NOTE)     Result repeated and verified.     Result confirmed by automatic dilution.     Performed at [facility], [place]     Status: None   Collection Time    10/22/13 12:54 PM      Result Value Ref Range   Vancomycin Tr 17.7  10.0 - 20.0 ug/mL  GLUCOSE, CAPILLARY     Status: Abnormal   Collection Time    10/22/13  5:20 PM      Result Value Ref Range   Glucose-Capillary 111 (*) 70 - 99 mg/dL   Comment 1 Documented in Chart     Comment 2 Notify RN    GLUCOSE, CAPILLARY     Status: Abnormal   Collection Time    10/22/13 11:52 PM      Result Value Ref Range   Glucose-Capillary 113 (*) 70 - 99 mg/dL   Comment 1 Notify RN    GLUCOSE, CAPILLARY     Status: Abnormal   Collection Time    10/23/13  5:24 AM      Result Value Ref Range   Glucose-Capillary 111 (*) 70 - 99 mg/dL  COMPREHENSIVE METABOLIC PANEL     Status: Abnormal   Collection Time    10/23/13  6:05  AM      Result Value Ref Range   Sodium 134 (*) 137 - 147 mEq/L   Potassium 3.8  3.7 - 5.3 mEq/L   Chloride 98  96 - 112 mEq/L   CO2 24  19 - 32 mEq/L   Glucose, Bld 106 (*) 70 - 99 mg/dL   BUN 10  6 - 23 mg/dL   Creatinine, Ser 0.48 (*) 0.50 - 1.10 mg/dL   Calcium 8.9  8.4 - 10.5 mg/dL   Total Protein 7.8  6.0 - 8.3 g/dL   Albumin 2.0 (*) 3.5 - 5.2 g/dL   AST 142 (*) 0 - 37 U/L   ALT 244 (*) 0 - 35 U/L   Alkaline Phosphatase 501 (*) 39 - 117 U/L   Total Bilirubin 0.2 (*) 0.3 - 1.2 mg/dL   GFR calc non Af Amer >90  >90 mL/min   GFR calc Af Amer >90  >90 mL/min   Comment: (NOTE)     The eGFR has been calculated using the CKD EPI equation.     This calculation has not been validated in all clinical situations.     eGFR's persistently <90 mL/min signify possible Chronic Kidney     Disease.   Anion gap 12  5 - 15  MAGNESIUM     Status: None   Collection Time    10/23/13  6:05 AM      Result Value Ref Range   Magnesium 1.9  1.5 - 2.5 mg/dL  PHOSPHORUS     Status: None    Collection Time    10/23/13  6:05 AM      Result Value Ref Range   Phosphorus 4.0  2.3 - 4.6 mg/dL  CBC     Status: Abnormal   Collection Time    10/23/13  6:05 AM      Result Value Ref Range   WBC 12.8 (*) 4.0 - 10.5 K/uL   RBC 3.11 (*) 3.87 - 5.11 MIL/uL   Hemoglobin 7.2 (*) 12.0 - 15.0 g/dL   HCT 23.9 (*) 36.0 - 46.0 %   MCV 76.8 (*) 78.0 - 100.0 fL   MCH 23.2 (*) 26.0 - 34.0 pg   MCHC 30.1  30.0 - 36.0 g/dL   RDW 21.3 (*) 11.5 - 15.5 %   Platelets 668 (*) 150 - 400 K/uL  GLUCOSE, CAPILLARY     Status: Abnormal   Collection Time    10/23/13 11:47 AM      Result Value Ref Range   Glucose-Capillary 112 (*) 70 - 99 mg/dL  GLUCOSE, CAPILLARY     Status: Abnormal   Collection Time    10/23/13  4:40 PM      Result Value Ref Range   Glucose-Capillary 127 (*) 70 - 99 mg/dL  CBC     Status: Abnormal   Collection Time    10/24/13  4:50 AM      Result Value Ref Range   WBC 10.9 (*) 4.0 - 10.5 K/uL   RBC 3.29 (*) 3.87 - 5.11 MIL/uL   Hemoglobin 7.6 (*) 12.0 - 15.0 g/dL   HCT 25.5 (*) 36.0 - 46.0 %   MCV 77.5 (*) 78.0 - 100.0 fL   MCH 23.1 (*) 26.0 - 34.0 pg   MCHC 29.8 (*) 30.0 - 36.0 g/dL   RDW 21.3 (*) 11.5 - 15.5 %   Platelets 706 (*) 150 - 400 K/uL  COMPREHENSIVE METABOLIC PANEL     Status: Abnormal  Collection Time    10/24/13  4:50 AM      Result Value Ref Range   Sodium 135 (*) 137 - 147 mEq/L   Potassium 4.0  3.7 - 5.3 mEq/L   Chloride 98  96 - 112 mEq/L   CO2 23  19 - 32 mEq/L   Glucose, Bld 110 (*) 70 - 99 mg/dL   BUN 11  6 - 23 mg/dL   Creatinine, Ser 0.48 (*) 0.50 - 1.10 mg/dL   Calcium 9.2  8.4 - 10.5 mg/dL   Total Protein 8.3  6.0 - 8.3 g/dL   Albumin 2.2 (*) 3.5 - 5.2 g/dL   AST 96 (*) 0 - 37 U/L   ALT 189 (*) 0 - 35 U/L   Alkaline Phosphatase 487 (*) 39 - 117 U/L   Total Bilirubin 0.2 (*) 0.3 - 1.2 mg/dL   GFR calc non Af Amer >90  >90 mL/min   GFR calc Af Amer >90  >90 mL/min   Comment: (NOTE)     The eGFR has been calculated using the CKD EPI  equation.     This calculation has not been validated in all clinical situations.     eGFR's persistently <90 mL/min signify possible Chronic Kidney     Disease.   Anion gap 14  5 - 15  GLUCOSE, CAPILLARY     Status: None   Collection Time    10/24/13  7:29 AM      Result Value Ref Range   Glucose-Capillary 97  70 - 99 mg/dL    Imaging / Studies: No results found.  Scheduled Meds: . antiseptic oral rinse  15 mL Mouth Rinse q12n4p  . chlorhexidine  15 mL Mouth Rinse BID  . hydroxychloroquine  200 mg Oral BID  . imipenem-cilastatin  500 mg Intravenous 4 times per day  . insulin aspart  0-9 Units Subcutaneous TID WC  . lactose free nutrition  237 mL Oral BID BM  . methocarbamol (ROBAXIN) IV  1,000 mg Intravenous Q8H  . metoCLOPramide (REGLAN) injection  5 mg Intravenous 3 times per day  . micafungin [facility]) IV  100 mg Intravenous Daily  . ondansetron (ZOFRAN) IV  4 mg Intravenous 4 times per day  . OxyCODONE  10 mg Oral Q12H  . pantoprazole (PROTONIX) IV  40 mg Intravenous QHS  . predniSONE  5 mg Oral BID WC  . sodium chloride  10-40 mL Intracatheter Q12H  . vancomycin  1,000 mg Intravenous Q8H   Continuous Infusions: . sodium chloride 30 mL/hr at 10/17/13 0514  . Marland KitchenTPN (CLINIMIX-E) Adult 80 mL/hr at 10/23/13 1742   And  . fat emulsion 250 mL (10/23/13 1741)   PRN Meds:.acetaminophen, albuterol, bisacodyl, HYDROmorphone (DILAUDID) injection, lip balm, ondansetron (ZOFRAN) IV, ondansetron, oxyCODONE, phenol, sodium chloride, sodium chloride    Antibiotics: Anti-infectives   Start     Dose/Rate Route Frequency Ordered Stop   10/21/13 1630  micafungin (MYCAMINE) 100 mg in sodium chloride 0.9 % 100 mL IVPB     100 mg 100 mL/hr over 1 Hours Intravenous Daily 10/21/13 1511     10/21/13 1400  fluconazole (DIFLUCAN) IVPB 200 mg  Status:  Discontinued     200 mg 100 mL/hr over 60 Minutes Intravenous Every 24 hours 10/21/13 1207 10/21/13 1511   10/20/13 1800   imipenem-cilastatin (PRIMAXIN) 500 mg in sodium chloride 0.9 % 100 mL IVPB     500 mg 200 mL/hr over 30 Minutes Intravenous 4 times per day  10/20/13 1536     10/20/13 1000  hydroxychloroquine (PLAQUENIL) tablet 200 mg     200 mg Oral 2 times daily 10/20/13 0906     10/19/13 2200  vancomycin (VANCOCIN) IVPB 1000 mg/200 mL premix     1,000 mg 200 mL/hr over 60 Minutes Intravenous Every 8 hours 10/19/13 1402     10/17/13 2315  vancomycin (VANCOCIN) 1,250 mg in sodium chloride 0.9 % 250 mL IVPB  Status:  Discontinued     1,250 mg 166.7 mL/hr over 90 Minutes Intravenous 3 times per day 10/17/13 2303 10/19/13 1337   10/16/13 0830  vancomycin (VANCOCIN) IVPB 1000 mg/200 mL premix  Status:  Discontinued     1,000 mg 200 mL/hr over 60 Minutes Intravenous 3 times per day 10/16/13 0730 10/17/13 2303   10/14/13 2200  vancomycin (VANCOCIN) IVPB 750 mg/150 ml premix  Status:  Discontinued     750 mg 150 mL/hr over 60 Minutes Intravenous Every 8 hours 10/14/13 1158 10/16/13 0730   10/14/13 1400  ceFEPIme (MAXIPIME) 1 g in dextrose 5 % 50 mL IVPB  Status:  Discontinued     1 g 100 mL/hr over 30 Minutes Intravenous 3 times per day 10/14/13 1125 10/20/13 1536   10/14/13 1400  vancomycin (VANCOCIN) 1,500 mg in sodium chloride 0.9 % 500 mL IVPB     1,500 mg 250 mL/hr over 120 Minutes Intravenous  Once 10/14/13 1158 10/14/13 1658   10/12/13 1600  levofloxacin (LEVAQUIN) IVPB 500 mg  Status:  Discontinued     500 mg 100 mL/hr over 60 Minutes Intravenous Every 24 hours 10/12/13 1545 10/14/13 1124   10/10/13 1230  ceFAZolin (ANCEF) IVPB 2 g/50 mL premix     2 g 100 mL/hr over 30 Minutes Intravenous  Once 10/10/13 1220 10/10/13 1222   10/10/13 0600  [MAR Hold]  ceFAZolin (ANCEF) IVPB 2 g/50 mL premix     (On MAR Hold since 10/10/13 0659)   2 g 100 mL/hr over 30 Minutes Intravenous On call to O.R. 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  ? Repeat CT of abdomen prior to removal -continue with atbx, micafungin per primary team  -BID wet to dry dressing changes  -DC remaining staples  Erby Pian, [facility] Surgery Pager [phone] Office [phone]  10/24/2013 11:04 AM

## 2013-10-24 NOTE — Progress Notes (Signed)
INFECTIOUS DISEASE PROGRESS NOTE  ID: Andrea Best is a 43 y.o. female with  Principal Problem:   Gastric outlet obstruction Active Problems:   Gastric cancer   Anemia, iron deficiency   Protein-calorie malnutrition, severe   Hypomagnesemia   Hyperphosphatemia   Fever, unspecified   Tachycardia   Leucocytosis   HCAP (healthcare-associated pneumonia)  Subjective: Without complaints  Abtx:  Anti-infectives   Start     Dose/Rate Route Frequency Ordered Stop   10/21/13 1630  micafungin (MYCAMINE) 100 mg in sodium chloride 0.9 % 100 mL IVPB     100 mg 100 mL/hr over 1 Hours Intravenous Daily 10/21/13 1511     10/21/13 1400  fluconazole (DIFLUCAN) IVPB 200 mg  Status:  Discontinued     200 mg 100 mL/hr over 60 Minutes Intravenous Every 24 hours 10/21/13 1207 10/21/13 1511   10/20/13 1800  imipenem-cilastatin (PRIMAXIN) 500 mg in sodium chloride 0.9 % 100 mL IVPB     500 mg 200 mL/hr over 30 Minutes Intravenous 4 times per day 10/20/13 1536     10/20/13 1000  hydroxychloroquine (PLAQUENIL) tablet 200 mg     200 mg Oral 2 times daily 10/20/13 0906     10/19/13 2200  vancomycin (VANCOCIN) IVPB 1000 mg/200 mL premix     1,000 mg 200 mL/hr over 60 Minutes Intravenous Every 8 hours 10/19/13 1402     10/17/13 2315  vancomycin (VANCOCIN) 1,250 mg in sodium chloride 0.9 % 250 mL IVPB  Status:  Discontinued     1,250 mg 166.7 mL/hr over 90 Minutes Intravenous 3 times per day 10/17/13 2303 10/19/13 1337   10/16/13 0830  vancomycin (VANCOCIN) IVPB 1000 mg/200 mL premix  Status:  Discontinued     1,000 mg 200 mL/hr over 60 Minutes Intravenous 3 times per day 10/16/13 0730 10/17/13 2303   10/14/13 2200  vancomycin (VANCOCIN) IVPB 750 mg/150 ml premix  Status:  Discontinued     750 mg 150 mL/hr over 60 Minutes Intravenous Every 8 hours 10/14/13 1158 10/16/13 0730   10/14/13 1400  ceFEPIme (MAXIPIME) 1 g in dextrose 5 % 50 mL IVPB  Status:  Discontinued     1 g 100 mL/hr over 30  Minutes Intravenous 3 times per day 10/14/13 1125 10/20/13 1536   10/14/13 1400  vancomycin (VANCOCIN) 1,500 mg in sodium chloride 0.9 % 500 mL IVPB     1,500 mg 250 mL/hr over 120 Minutes Intravenous  Once 10/14/13 1158 10/14/13 1658   10/12/13 1600  levofloxacin (LEVAQUIN) IVPB 500 mg  Status:  Discontinued     500 mg 100 mL/hr over 60 Minutes Intravenous Every 24 hours 10/12/13 1545 10/14/13 1124   10/10/13 1230  ceFAZolin (ANCEF) IVPB 2 g/50 mL premix     2 g 100 mL/hr over 30 Minutes Intravenous  Once 10/10/13 1220 10/10/13 1222   10/10/13 0600  [MAR Hold]  ceFAZolin (ANCEF) IVPB 2 g/50 mL premix     (On MAR Hold since 10/10/13 0659)   2 g 100 mL/hr over 30 Minutes Intravenous On call to O.R. 10/09/13 1244 10/10/13 0743      Medications:  Scheduled: . antiseptic oral rinse  15 mL Mouth Rinse q12n4p  . chlorhexidine  15 mL Mouth Rinse BID  . hydroxychloroquine  200 mg Oral BID  . imipenem-cilastatin  500 mg Intravenous 4 times per day  . insulin aspart  0-9 Units Subcutaneous TID WC  . lactose free nutrition  237 mL Oral BID  BM  . methocarbamol (ROBAXIN) IV  1,000 mg Intravenous Q8H  . metoCLOPramide (REGLAN) injection  5 mg Intravenous 3 times per day  . micafungin [facility]) IV  100 mg Intravenous Daily  . ondansetron (ZOFRAN) IV  4 mg Intravenous 4 times per day  . OxyCODONE  10 mg Oral Q12H  . pantoprazole (PROTONIX) IV  40 mg Intravenous QHS  . predniSONE  5 mg Oral BID WC  . sodium chloride  10-40 mL Intracatheter Q12H  . vancomycin  1,000 mg Intravenous Q8H    Objective: Vital signs in last 24 hours: Temp:  [99.6 F (37.6 C)-99.8 F (37.7 C)] 99.7 F (37.6 C) (07/31 1340) Pulse Rate:  [102-110] 109 (07/31 1340) Resp:  [18] 18 (07/31 1340) BP: (107-108)/(57-66) 108/57 mmHg (07/31 1340) SpO2:  [99 %-100 %] 99 % (07/31 1340)   General appearance: alert, cooperative and no distress Resp: clear to auscultation bilaterally Cardio: regular rate and rhythm GI:  normal findings: bowel sounds normal and soft, non-tender  Lab Results  Recent Labs  10/23/13 0605 10/24/13 0450  WBC 12.8* 10.9*  HGB 7.2* 7.6*  HCT 23.9* 25.5*  NA 134* 135*  K 3.8 4.0  CL 98 98  CO2 24 23  BUN 10 11  CREATININE 0.48* 0.48*   Liver Panel  Recent Labs  10/23/13 0605 10/24/13 0450  PROT 7.8 8.3  ALBUMIN 2.0* 2.2*  AST 142* 96*  ALT 244* 189*  ALKPHOS 501* 487*  BILITOT 0.2* 0.2*   Sedimentation Rate No results found for this basename: ESRSEDRATE,  in the last 72 hours C-Reactive Protein No results found for this basename: CRP,  in the last 72 hours  Microbiology: Recent Results (from the past 240 hour(s))  CULTURE, BLOOD (ROUTINE X 2)     Status: None   Collection Time    10/19/13  4:55 PM      Result Value Ref Range Status   Specimen Description BLOOD LEFT ARM   Final   Special Requests     Final   Value: BOTTLES DRAWN AEROBIC AND ANAEROBIC 10CC BOTH BOTTLES   Culture  Setup Time     Final   Value: 10/19/2013 22:40     Performed at [facility]   Culture     Final   Value:        BLOOD CULTURE RECEIVED NO GROWTH TO DATE CULTURE WILL BE HELD FOR 5 DAYS BEFORE ISSUING A FINAL NEGATIVE REPORT     Performed at [facility]   Report Status PENDING   Incomplete  BODY FLUID CULTURE     Status: None   Collection Time    10/20/13  5:28 PM      Result Value Ref Range Status   Specimen Description OTHER   Final   Special Requests Normal JP DRAIN IN RIGHT ABDOMEN   Final   Gram Stain     Final   Value: MODERATE WBC PRESENT,BOTH PMN AND MONONUCLEAR     YEAST     Performed at [facility]   Culture     Final   Value: MODERATE CANDIDA TROPICALIS     Performed at [facility]   Report Status 10/24/2013 FINAL   Final  CULTURE, ROUTINE-ABSCESS     Status: None   Collection Time    10/21/13 10:58 AM      Result Value Ref Range Status   Specimen Description PERITONEAL CAVITY PERISPLENIC   Final   Special Requests  NONE   Final   Gram Stain     Final   Value: FEW WBC PRESENT,BOTH PMN AND MONONUCLEAR     NO ORGANISMS SEEN     Performed at [facility]   Culture     Final   Value: NO GROWTH 3 DAYS     Performed at [facility]   Report Status 10/24/2013 FINAL   Final  BODY FLUID CULTURE     Status: None   Collection Time    10/21/13 10:58 AM      Result Value Ref Range Status   Specimen Description PERITONEAL CAVITY PELVIC   Final   Special Requests NONE   Final   Gram Stain     Final   Value: ABUNDANT WBC PRESENT,BOTH PMN AND MONONUCLEAR     NO ORGANISMS SEEN     Performed at [facility]   Culture     Final   Value: NO GROWTH 3 DAYS     Performed at [facility]   Report Status 10/24/2013 FINAL   Final  FUNGUS CULTURE W SMEAR     Status: None   Collection Time    10/21/13  3:30 PM      Result Value Ref Range Status   Specimen Description PLEURAL   Final   Special Requests Normal   Final   Fungal Smear     Final   Value: NO YEAST OR FUNGAL ELEMENTS SEEN     Performed at [facility]   Culture     Final   Value: CULTURE IN PROGRESS FOR FOUR WEEKS     Performed at [facility]   Report Status PENDING   Incomplete  AFB CULTURE WITH SMEAR     Status: None   Collection Time    10/21/13  3:49 PM      Result Value Ref Range Status   Specimen Description PLEURAL   Final   Special Requests Normal   Final   Acid Fast Smear     Final   Value: NO ACID FAST BACILLI SEEN     Performed at [facility]   Culture     Final   Value: CULTURE WILL BE EXAMINED FOR 6 WEEKS BEFORE ISSUING A FINAL REPORT     Performed at [facility]   Report Status PENDING   Incomplete  BODY FLUID CULTURE     Status: None   Collection Time    10/21/13  3:49 PM      Result Value Ref Range Status   Specimen Description PLEURAL   Final   Special Requests Normal   Final   Gram Stain     Final   Value: FEW WBC PRESENT,BOTH PMN AND MONONUCLEAR     NO  ORGANISMS SEEN     Performed at [facility]   Culture     Final   Value: NO GROWTH 2 DAYS     Performed at [facility]   Report Status PENDING   Incomplete    Studies/Results: No results found.   Assessment/Plan: Leukocytosis  Pneumonia  abd abscesses  Body fluid cx (7-27)- candida tropicalis Severe protein calorie malnutrition   Total days of antibiotics: 13  levaquin 7-19 -->7-21  Cefepime 7-21 --> 7-27  Vancomycin 7-21 -->  Imipenem 7-27 -->  Diflucan 7-28 --> 7-28  Micafungin 7-28 -->  Would plan for 2-3 weeks of micafungin from drainage (7-27). Base duration on f/u CT.  Will stop vanco.  Could plan on stopping imipenem at d/c as well.   available if questions         Bobby Rumpf Infectious Diseases (pager) [phone] [email or web address] 10/24/2013, 5:06 PM  LOS: 16 days   **Disclaimer: This note may have been dictated with voice recognition software. Similar sounding words can inadvertently be transcribed and this note may contain transcription errors which may not have been corrected upon publication of note.**

## 2013-10-24 NOTE — Progress Notes (Signed)
Seen, agree with above. Wean TNA. Work on [employer]. [employer] discharge Monday if does OK.

## 2013-10-24 NOTE — Progress Notes (Signed)
[facility] PROGRESS NOTE  Andrea Best TGG:[id number] DOB: 06/06/1970 DOA: 10/08/2013 PCP: Conni Slipper, MD   Brief narrative:  43 y.o. 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 She underwent subtotal gastrectomy with porta hepatis and peripancreatic lymph node dissection with a Roux-en-Y gastrojejunostomy on 10/10/13. Hospital course was complicated due to development of HCAP for which she is on maxipime and vancomycin and now with persistent tachycardia, low grade fever and progressive leucocytosis.  Assessment/Plan: Fever/tachycardia/   leucocytosis/ HCAP  -CXR suggestive of developing PNA in left lower lobe post op on 7/19. Patient started on Levaquin but changed to primaxin and vanco on 10/14/13. Repeat CXR 10/18/2013 still showing consolidation in the left lower lobe.  -ID consult appreciated . patient ruled out for DVT and PE and persistent tachycardia and leukocytosis. CT of chest and abd shows left pleural effusion and fluid collection inferior to spleen and free pelvic fluid which were drained and sent for culture. Body fluid cx showed yeast on stain and placed on empiric micafungin. -Closer so far negative. ID recommends 2 weeks of empiric antibiotics if CT shows resolution. Repeat CT planned prior to discharge and possibly pull out of the JP drain. Will defer to surgery.   Gastric outlet obstruction secondary to gastric cancer  -Status post subtotal gastrectomy with porta hepatis and peripancreatic lymph node dissection with a Roux-en-Y gastrojejunostomy on 10/10/13. Was in SDU post-op and transferred to telemetry...  -Dr. Benay Spice saw the patient in consultation. Plan on adjuvant chemotherapy.  -Pain control with oxycodone IR and ER. -on soft diet.   Currently meeting 75% of  her total calorie intake. Will need TNA for another day.  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 Check iron panel Hemoglobin ranges from 7-8.   Protein-calorie malnutrition Added resource breeze bid. 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 Reports to follow up with her Gyn upon discharge  DVT Prophylaxis  Sq lovenox   Code Status: full code Family Communication: none at bedside Disposition Plan: home possibly in 2 days once off TNA   Consultants:  ID   CCS   Dr Learta Codding  Pulmonary   IR    Procedures:  Subtotal gastrectomy with porta hepatis and peripancreatic lymph node dissection, Roux-en-Y gastrojejunostomy 10/10/13 by Dr. Excell Seltzer.  Aspiration of perisplenic and pelvic fluid on 7/28. Left thoracentesis on 7/28  Antibiotics:  Levaquin 7/19 --> 7/21  primaxin 7/21 -->  Vancomycin 7/21 --> micafungin 7/28--   HPI/Subjective: Still has some  abdominal pain but stable with current pain medication. Tolerating soft diet   Objective: Filed Vitals:   10/24/13 1340  BP: 108/57  Pulse: 109  Temp: 99.7 F (37.6 C)  Resp: 18    Intake/Output Summary (Last 24 hours) at 10/24/13 1551 Last data filed at 10/24/13 1500  Gross per 24 hour  Intake   3100 ml  Output   2610 ml  Net    490 ml   Filed Weights   10/12/13 0400 10/13/13 0400 10/18/13 0548  Weight: 74.2 kg (163 lb 9.3 oz) 73.4 kg (161 lb 13.1 oz) 71.305  kg (157 lb 3.2 oz)    Exam:   General:  NAD  HEENT: pallor+,  moist mucosa  Chest: clear to auscultation b/l,   CVS: S1&S2 tachycardic, no Murmurs  Abd: soft, dressing over midline incision clean.  JP drain with milky discharge. BS+, non  tender  Ext: warm,    Data Reviewed: Basic Metabolic Panel:  Recent Labs Lab 10/20/13 0500 10/21/13 0510 10/22/13 0410 10/23/13 0605 10/24/13 0450  NA 134* 134* 134* 134* 135*  K 3.8 4.1 4.3 3.8 4.0  CL 100 98 99 98 98  CO2 22 23 24 24 23   GLUCOSE 108* 120* 112* 106* 110*  BUN 9 10 10 10 11   CREATININE 0.47* 0.43* 0.44* 0.48* 0.48*  CALCIUM 8.7 8.9 8.9 8.9 9.2  MG 1.9  --   --  1.9  --   PHOS 4.5  --   --  4.0  --    Liver Function Tests:  Recent Labs Lab 10/20/13 0500 10/21/13 0510 10/23/13 0605 10/24/13 0450  AST 142*  --  142* 96*  ALT 131*  --  244* 189*  ALKPHOS 481*  --  501* 487*  BILITOT <0.2*  --  0.2* 0.2*  PROT 7.7 7.5 7.8 8.3  ALBUMIN 1.8*  --  2.0* 2.2*    Recent Labs Lab 10/19/13 1655 10/20/13 1214  LIPASE 121* 151*  AMYLASE 173*  --    No results found for this basename: AMMONIA,  in the last 168 hours CBC:  Recent Labs Lab 10/20/13 0500 10/21/13 0510 10/22/13 0410 10/23/13 0605 10/24/13 0450  WBC 22.1* 23.7* 17.5* 12.8* 10.9*  NEUTROABS 18.2*  --   --   --   --   HGB 7.3* 7.0* 7.2* 7.2* 7.6*  HCT 23.7* 23.7* 23.6* 23.9* 25.5*  MCV 74.8* 76.7* 77.6* 76.8* 77.5*  PLT 514* 542* 583* 668* 706*   Cardiac Enzymes: No results found for this basename: CKTOTAL, CKMB, CKMBINDEX, TROPONINI,  in the last 168 hours BNP (last 3 results) No results found for this basename: PROBNP,  in the last 8760 hours CBG:  Recent Labs Lab 10/23/13 0524 10/23/13 1147 10/23/13 1640 10/24/13 0729 10/24/13 1127  GLUCAP 111* 112* 127* 97 131*    Recent Results (from the past 240 hour(s))  CULTURE, BLOOD (ROUTINE X 2)     Status: None   Collection Time    10/19/13  4:55 PM      Result Value Ref Range Status   Specimen Description BLOOD LEFT ARM   Final   Special Requests     Final   Value: BOTTLES DRAWN AEROBIC AND ANAEROBIC 10CC BOTH BOTTLES   Culture  Setup Time     Final   Value: 10/19/2013 22:40     Performed at [facility]    Culture     Final   Value:        BLOOD CULTURE RECEIVED NO GROWTH TO DATE CULTURE WILL BE HELD FOR 5 DAYS BEFORE ISSUING A FINAL NEGATIVE REPORT     Performed at [facility]   Report Status PENDING   Incomplete  BODY FLUID CULTURE     Status: None   Collection Time    10/20/13  5:28 PM      Result Value Ref Range Status   Specimen Description OTHER   Final   Special Requests Normal JP DRAIN IN RIGHT ABDOMEN   Final   Gram Stain     Final   Value: MODERATE WBC  PRESENT,BOTH PMN AND MONONUCLEAR     YEAST     Performed at [facility]   Culture     Final   Value: MODERATE CANDIDA TROPICALIS     Performed at [facility]   Report Status 10/24/2013 FINAL   Final  CULTURE, ROUTINE-ABSCESS     Status: None   Collection Time    10/21/13 10:58 AM      Result Value Ref Range Status   Specimen Description PERITONEAL CAVITY PERISPLENIC   Final   Special Requests NONE   Final   Gram Stain     Final   Value: FEW WBC PRESENT,BOTH PMN AND MONONUCLEAR     NO ORGANISMS SEEN     Performed at [facility]   Culture     Final   Value: NO GROWTH 3 DAYS     Performed at [facility]   Report Status 10/24/2013 FINAL   Final  BODY FLUID CULTURE     Status: None   Collection Time    10/21/13 10:58 AM      Result Value Ref Range Status   Specimen Description PERITONEAL CAVITY PELVIC   Final   Special Requests NONE   Final   Gram Stain     Final   Value: ABUNDANT WBC PRESENT,BOTH PMN AND MONONUCLEAR     NO ORGANISMS SEEN     Performed at [facility]   Culture     Final   Value: NO GROWTH 3 DAYS     Performed at [facility]   Report Status 10/24/2013 FINAL   Final  FUNGUS CULTURE W SMEAR     Status: None   Collection Time    10/21/13  3:30 PM      Result Value Ref Range Status   Specimen Description PLEURAL   Final   Special Requests Normal   Final   Fungal Smear     Final   Value: NO YEAST OR FUNGAL ELEMENTS SEEN     Performed at  [facility]   Culture     Final   Value: CULTURE IN PROGRESS FOR FOUR WEEKS     Performed at [facility]   Report Status PENDING   Incomplete  AFB CULTURE WITH SMEAR     Status: None   Collection Time    10/21/13  3:49 PM      Result Value Ref Range Status   Specimen Description PLEURAL   Final   Special Requests Normal   Final   Acid Fast Smear     Final   Value: NO ACID FAST BACILLI SEEN     Performed at [facility]   Culture     Final   Value: CULTURE WILL BE EXAMINED FOR 6 WEEKS BEFORE ISSUING A FINAL REPORT     Performed at [facility]   Report Status PENDING   Incomplete  BODY FLUID CULTURE     Status: None   Collection Time    10/21/13  3:49 PM      Result Value Ref Range Status   Specimen Description PLEURAL   Final   Special Requests Normal   Final   Gram Stain     Final   Value: FEW WBC PRESENT,BOTH PMN AND MONONUCLEAR     NO ORGANISMS SEEN     Performed at [facility]   Culture     Final   Value: NO GROWTH 2 DAYS  Performed at [facility]   Report Status PENDING   Incomplete     Studies: No results found.  Scheduled Meds: . antiseptic oral rinse  15 mL Mouth Rinse q12n4p  . chlorhexidine  15 mL Mouth Rinse BID  . hydroxychloroquine  200 mg Oral BID  . imipenem-cilastatin  500 mg Intravenous 4 times per day  . insulin aspart  0-9 Units Subcutaneous TID WC  . lactose free nutrition  237 mL Oral BID BM  . methocarbamol (ROBAXIN) IV  1,000 mg Intravenous Q8H  . metoCLOPramide (REGLAN) injection  5 mg Intravenous 3 times per day  . micafungin [facility]) IV  100 mg Intravenous Daily  . ondansetron (ZOFRAN) IV  4 mg Intravenous 4 times per day  . OxyCODONE  10 mg Oral Q12H  . pantoprazole (PROTONIX) IV  40 mg Intravenous QHS  . predniSONE  5 mg Oral BID WC  . sodium chloride  10-40 mL Intracatheter Q12H  . vancomycin  1,000 mg Intravenous Q8H   Continuous Infusions: . sodium chloride 30 mL/hr at  10/17/13 0514  . Marland KitchenTPN (CLINIMIX-E) Adult     And  . fat emulsion    . Marland KitchenTPN (CLINIMIX-E) Adult 40 mL/hr at 10/24/13 1250   And  . fat emulsion        Time spent: 25 minutes    Louellen Molder  [facility] Pager [phone] If 7PM-7AM, please contact night-coverage at [email or web address], password [facility] 10/24/2013, 3:51 PM  LOS: 16 days

## 2013-10-24 NOTE — Progress Notes (Signed)
Calorie Count Note  48 hour calorie count ordered, ends today.   Intervention:  - TPN per pharmacist - Encouraged increased PO and supplement intake - RD to continue to monitor   Diet: Regular Supplements: Boost Plus BID  Breakfast: 210 calories, 3g protein Supplement: 360 calories, 14g protein   Total intake: 570 calories (28% of minimum estimated needs)  17g protein (20% of minimum estimated needs)  Met with pt who ate most of her breakfast this morning - applesauce, yogurt, juice, and oatmeal and c/o still being full. Was working on finishing Boost Plus during 2nd visit today with pt. Noted plan is to start weaning TPN to hopefully stimulate pt's PO intake.   Nutrition Dx: Inadequate oral intake related to nausea/stomach pains as evidenced by PO intake <75%, 16% body weight loss in 4 months; ongoing    Goal: Pt to meet >/= 90% of their estimated nutrition needs; not met    TPN: Clinimix E 5/15 @ 40 ml/hr and lipids @ 10 ml/hr. Provides 1162 kcal, and 48 grams protein per day. Meets 58% minimum estimated energy needs and 56% minimum estimated protein needs.    Heather Winkler MS, RD, LDN [phone] Pager [phone] Weekend/After Hours Pager  

## 2013-10-24 NOTE — Progress Notes (Signed)
Physical Therapy Treatment Patient Details Name: Andrea Best MRN: [id number] DOB: Sep 21, 1970 Today's Date: 10/24/2013    History of Present Illness 43 y.o. 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 Pt s/p subtotal gastrectomy with porta hepatis and peripancreatic lymph node dissection with a Roux-en-Y gastrojejunostomy on 10/10/13.  Pt s/p  left thoracentesis 7/28    PT Comments    Pt feeling "alittle better".  Doing well negotiating self around room and to and from bathroom.  Amb well in hallway holding to IV pole.  Avg HR 140.  Pt stated "I run high".   Pt has met maximal mobility level and no longer is in need of Acute PT.  Pt stated she also has been amb with family.  Will update LPT to D/C Acute PT services.    Follow Up Recommendations  No PT follow up     Equipment Recommendations       Recommendations for Other Services       Precautions / Restrictions Precautions Precaution Comments: R drain    Mobility  Bed Mobility Overal bed mobility: Modified Independent Bed Mobility: Supine to Sit;Sit to Supine           General bed mobility comments: increased time  Transfers Overall transfer level: Modified independent Equipment used: None (pushes IV pole) Transfers: Sit to/from Stand Sit to Stand: Modified independent (Device/Increase time)         General transfer comment: increased time  Ambulation/Gait Ambulation/Gait assistance: Supervision;Modified independent (Device/Increase time) Ambulation Distance (Feet): 500 Feet Assistive device: None (pushes IV pole) Gait Pattern/deviations: Step-through pattern;Decreased stride length Gait velocity: decr   General Gait Details: Pt amb with IV pole safely.  AVG HR 140.  Pt knowledgable to stop and take rest breaks.     Stairs            Wheelchair Mobility     Modified Rankin (Stroke Patients Only)       Balance                                    Cognition                            Exercises      General Comments        Pertinent Vitals/Pain avg HR 140    Home Living                      Prior Function            PT Goals (current goals can now be found in the care plan section) Progress towards PT goals: Progressing toward goals    Frequency       PT Plan      Co-evaluation             End of Session Equipment Utilized During Treatment: Gait belt Activity Tolerance: Patient tolerated treatment well Patient left: in bed;with call bell/phone within reach     Time: 1356-1412 PT Time Calculation (min): 16 min  Charges:  $Gait Training: 8-22 mins                    G Codes:      Rica Koyanagi  PTA [facility]  Acute  Rehab Pager  [phone]

## 2013-10-25 DIAGNOSIS — B379 Candidiasis, unspecified: Secondary | ICD-10-CM

## 2013-10-25 LAB — CBC
HEMATOCRIT: 25.9 % — AB (ref 36.0–46.0)
Hemoglobin: 7.6 g/dL — ABNORMAL LOW (ref 12.0–15.0)
MCH: 23.2 pg — AB (ref 26.0–34.0)
MCHC: 29.3 g/dL — ABNORMAL LOW (ref 30.0–36.0)
MCV: 79.2 fL (ref 78.0–100.0)
PLATELETS: 580 10*3/uL — AB (ref 150–400)
RBC: 3.27 MIL/uL — AB (ref 3.87–5.11)
RDW: 21.6 % — ABNORMAL HIGH (ref 11.5–15.5)
WBC: 7.8 10*3/uL (ref 4.0–10.5)

## 2013-10-25 LAB — BODY FLUID CULTURE
CULTURE: NO GROWTH
Special Requests: NORMAL

## 2013-10-25 LAB — COMPREHENSIVE METABOLIC PANEL
ALBUMIN: 2.1 g/dL — AB (ref 3.5–5.2)
ALK PHOS: 431 U/L — AB (ref 39–117)
ALT: 151 U/L — AB (ref 0–35)
AST: 87 U/L — ABNORMAL HIGH (ref 0–37)
Anion gap: 12 (ref 5–15)
BUN: 10 mg/dL (ref 6–23)
CO2: 23 mEq/L (ref 19–32)
Calcium: 9.2 mg/dL (ref 8.4–10.5)
Chloride: 96 mEq/L (ref 96–112)
Creatinine, Ser: 0.52 mg/dL (ref 0.50–1.10)
GFR calc Af Amer: >90 mL/min (ref >90)
GFR calc non Af Amer: >90 mL/min (ref >90)
Glucose, Bld: 94 mg/dL (ref 70–99)
Potassium: 4 mEq/L (ref 3.7–5.3)
Sodium: 131 mEq/L — ABNORMAL LOW (ref 137–147)
Total Bilirubin: 0.3 mg/dL (ref 0.3–1.2)
Total Protein: 8 g/dL (ref 6.0–8.3)

## 2013-10-25 LAB — CULTURE, BLOOD (ROUTINE X 2): CULTURE: NO GROWTH

## 2013-10-25 LAB — GLUCOSE, CAPILLARY
GLUCOSE-CAPILLARY: 115 mg/dL — AB (ref 70–99)
Glucose-Capillary: 106 mg/dL — ABNORMAL HIGH (ref 70–99)
Glucose-Capillary: 99 mg/dL (ref 70–99)

## 2013-10-25 MED ORDER — TRACE MINERALS CR-CU-F-FE-I-MN-MO-SE-ZN IV SOLN
INTRAVENOUS | Status: AC
  Administered 2013-10-25: 18:00:00 via INTRAVENOUS
  Filled 2013-10-25: qty 1000

## 2013-10-25 MED ORDER — FAT EMULSION 20 % IV EMUL
250.0000 mL | INTRAVENOUS | Status: AC
  Administered 2013-10-25: 250 mL via INTRAVENOUS
  Filled 2013-10-25: qty 250

## 2013-10-25 MED ORDER — ZOLPIDEM TARTRATE 5 MG PO TABS
5.0000 mg | ORAL_TABLET | Freq: Every evening | ORAL | Status: DC | PRN
  Administered 2013-10-25 – 2013-10-26 (×2): 5 mg via ORAL
  Filled 2013-10-25 (×2): qty 1

## 2013-10-25 NOTE — Progress Notes (Signed)
[facility] PROGRESS NOTE  Andrea Best IEP:[id number] DOB: 1970/07/02 DOA: 10/08/2013 PCP: Conni Slipper, MD   Brief narrative:  43 y.o. Female, Andrea Best of recently diagnosed gastric carcinoma with gastric outlet obstruction, evaluated by oncology and GI as an outpatient, referred for admission/surgical consultation on 10/08/13 secondary to intractable nausea/vomiting. She underwent subtotal gastrectomy with porta hepatis and peripancreatic lymph node dissection with a Roux-en-Y gastrojejunostomy on 10/10/13. Hospital course was complicated due to development of HCAP for which she is on maxipime and vancomycin and now with persistent tachycardia, low grade fever and progressive leucocytosis.  Assessment/Plan: Fever/tachycardia/   leucocytosis/ HCAP  -left LL PNA post op on 7/19. Patient started on Levaquin but changed to primaxin and vanco on 10/14/13. Repeat CXR 10/18/2013 still showing consolidation in the left lower lobe.  -ID consult appreciated . patient ruled out for DVT and PE and persistent tachycardia and leukocytosis. CT of chest and abd shows left pleural effusion and fluid collection inferior to spleen and free pelvic fluid which were drained and sent for culture. Body fluid cx showed yeast on stain ( candida Tropicalis)  and placed on empiric micafungin. ID will recommend duration of treatment. -Cxso far negative. . Sx will decide on removing JP drain w/without repeat CT .   Gastric outlet obstruction secondary to gastric cancer  -Status post subtotal gastrectomy with porta hepatis and peripancreatic lymph node dissection with a Roux-en-Y gastrojejunostomy on 10/10/13. Was in SDU post-op and transferred to telemetry...  -Dr. Benay Spice saw the patient in consultation. Plan on adjuvant chemotherapy.  -Pain control with oxycodone IR and ER. -on soft diet.  Currently meeting 75% of  her total calorie intake. 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 Check iron panel Hemoglobin ranges from 7-8.   Protein-calorie malnutrition Added resource breeze bid. 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 Reports to follow up with her Gyn upon discharge  DVT Prophylaxis  Sq lovenox   Code Status: full code Family Communication: none at bedside Disposition Plan: home possibly in 2 days once off TNA   Consultants:  ID   CCS   Dr Learta Codding  Pulmonary   IR    Procedures:  Subtotal gastrectomy with porta hepatis and peripancreatic lymph node dissection, Roux-en-Y gastrojejunostomy 10/10/13 by Dr. Excell Seltzer.  Aspiration of perisplenic and pelvic fluid on 7/28. Left thoracentesis on 7/28  Antibiotics:  Levaquin 7/19 --> 7/21  primaxin 7/21 -->  Vancomycin 7/21 --> micafungin 7/28--   HPI/Subjective: Reports abd pain but tolerable with pain meds   Objective: Filed Vitals:   10/25/13 0430  BP: 105/57  Pulse: 93  Temp: 98.4 F (36.9 C)  Resp: 16    Intake/Output Summary (Last 24 hours) at 10/25/13 1253 Last data filed at 10/25/13 1102  Gross per 24 hour  Intake   2380 ml  Output   2565 ml  Net   -185 ml   Filed Weights   10/12/13 0400 10/13/13 0400 10/18/13 0548  Weight: 74.2 kg (163 lb 9.3 oz) 73.4 kg (161 lb 13.1 oz) 71.305 kg (157 lb 3.2 oz)    Exam:   General:  NAD  HEENT: pallor+,  moist mucosa  Chest: clear  b/l,   CVS: N S1&S2 , no Murmurs  Abd: soft, dressing over midline incision clean.  JP drain with milky discharge. BS+, mild Left sided tenderness  Ext: warm,    Data Reviewed: Basic Metabolic Panel:  Recent Labs Lab 10/20/13 0500 10/21/13 0510 10/22/13 0410 10/23/13 0605  10/24/13 0450 10/25/13 0500  NA 134* 134* 134* 134* 135* 131*  K 3.8 4.1 4.3 3.8 4.0 4.0  CL 100 98 99 98 98 96  CO2 22 23 24 24 23 23   GLUCOSE 108* 120* 112* 106* 110* 94  BUN 9 10 10 10 11 10   CREATININE 0.47* 0.43* 0.44* 0.48* 0.48* 0.52  CALCIUM 8.7 8.9 8.9 8.9 9.2 9.2  MG 1.9  --   --  1.9  --   --   PHOS 4.5  --   --  4.0  --   --    Liver Function Tests:  Recent Labs Lab 10/20/13 0500 10/21/13 0510 10/23/13 0605 10/24/13 0450 10/25/13 0500  AST 142*  --  142* 96* 87*  ALT 131*  --  244* 189* 151*  ALKPHOS 481*  --  501* 487* 431*  BILITOT <0.2*  --  0.2* 0.2* 0.3  PROT 7.7 7.5 7.8 8.3 8.0  ALBUMIN 1.8*  --  2.0* 2.2* 2.1*    Recent Labs Lab 10/19/13 1655 10/20/13 1214  LIPASE 121* 151*  AMYLASE 173*  --    No results found for this basename: AMMONIA,  in the last 168 hours CBC:  Recent Labs Lab 10/20/13 0500 10/21/13 0510 10/22/13 0410 10/23/13 0605 10/24/13 0450 10/25/13 0500  WBC 22.1* 23.7* 17.5* 12.8* 10.9* 7.8  NEUTROABS 18.2*  --   --   --   --   --   HGB 7.3* 7.0* 7.2* 7.2* 7.6* 7.6*  HCT 23.7* 23.7* 23.6* 23.9* 25.5* 25.9*  MCV 74.8* 76.7* 77.6* 76.8* 77.5* 79.2  PLT 514* 542* 583* 668* 706* 580*   Cardiac Enzymes: No results found for this basename: CKTOTAL, CKMB, CKMBINDEX, TROPONINI,  in the last 168 hours BNP (last 3 results) No results found for this basename: PROBNP,  in the last 8760 hours CBG:  Recent Labs Lab 10/24/13 0729 10/24/13 1127 10/24/13 1648 10/25/13 0721 10/25/13 1240  GLUCAP 97 131* 110* 106* 115*    Recent Results (from the past 240 hour(s))  CULTURE, BLOOD (ROUTINE X 2)     Status: None   Collection Time    10/19/13  4:55 PM      Result Value Ref Range Status   Specimen Description BLOOD LEFT ARM   Final   Special Requests     Final   Value: BOTTLES DRAWN AEROBIC AND ANAEROBIC 10CC BOTH BOTTLES   Culture  Setup Time     Final   Value: 10/19/2013 22:40     Performed at [facility]   Culture      Final   Value:        BLOOD CULTURE RECEIVED NO GROWTH TO DATE CULTURE WILL BE HELD FOR 5 DAYS BEFORE ISSUING A FINAL NEGATIVE REPORT     Performed at [facility]   Report Status PENDING   Incomplete  BODY FLUID CULTURE     Status: None   Collection Time    10/20/13  5:28 PM      Result Value Ref Range Status   Specimen Description OTHER   Final   Special Requests Normal JP DRAIN IN RIGHT ABDOMEN   Final  Gram Stain     Final   Value: MODERATE WBC PRESENT,BOTH PMN AND MONONUCLEAR     YEAST     Performed at [facility]   Culture     Final   Value: MODERATE CANDIDA TROPICALIS     Performed at [facility]   Report Status 10/24/2013 FINAL   Final  CULTURE, ROUTINE-ABSCESS     Status: None   Collection Time    10/21/13 10:58 AM      Result Value Ref Range Status   Specimen Description PERITONEAL CAVITY PERISPLENIC   Final   Special Requests NONE   Final   Gram Stain     Final   Value: FEW WBC PRESENT,BOTH PMN AND MONONUCLEAR     NO ORGANISMS SEEN     Performed at [facility]   Culture     Final   Value: NO GROWTH 3 DAYS     Performed at [facility]   Report Status 10/24/2013 FINAL   Final  BODY FLUID CULTURE     Status: None   Collection Time    10/21/13 10:58 AM      Result Value Ref Range Status   Specimen Description PERITONEAL CAVITY PELVIC   Final   Special Requests NONE   Final   Gram Stain     Final   Value: ABUNDANT WBC PRESENT,BOTH PMN AND MONONUCLEAR     NO ORGANISMS SEEN     Performed at [facility]   Culture     Final   Value: NO GROWTH 3 DAYS     Performed at [facility]   Report Status 10/24/2013 FINAL   Final  FUNGUS CULTURE W SMEAR     Status: None   Collection Time    10/21/13  3:30 PM      Result Value Ref Range Status   Specimen Description PLEURAL   Final   Special Requests Normal   Final   Fungal Smear     Final   Value: NO YEAST OR FUNGAL ELEMENTS SEEN     Performed at [facility]   Culture     Final   Value: CULTURE IN PROGRESS FOR FOUR WEEKS     Performed at [facility]   Report Status PENDING   Incomplete  AFB CULTURE WITH SMEAR     Status: None   Collection Time    10/21/13  3:49 PM      Result Value Ref Range Status   Specimen Description PLEURAL   Final   Special Requests Normal   Final   Acid Fast Smear     Final   Value: NO ACID FAST BACILLI SEEN     Performed at [facility]   Culture     Final   Value: CULTURE WILL BE EXAMINED FOR 6 WEEKS BEFORE ISSUING A FINAL REPORT     Performed at [facility]   Report Status PENDING   Incomplete  BODY FLUID CULTURE     Status: None   Collection Time    10/21/13  3:49 PM      Result Value Ref Range Status   Specimen Description PLEURAL   Final   Special Requests Normal   Final   Gram Stain     Final   Value: FEW WBC PRESENT,BOTH PMN AND MONONUCLEAR     NO ORGANISMS SEEN     Performed at [facility]  Final   Value: NO GROWTH 2 DAYS     Performed at [facility]   Report Status PENDING   Incomplete     Studies: No results found.  Scheduled Meds: . antiseptic oral rinse  15 mL Mouth Rinse q12n4p  . chlorhexidine  15 mL Mouth Rinse BID  . hydroxychloroquine  200 mg Oral BID  . imipenem-cilastatin  500 mg Intravenous 4 times per day  . insulin aspart  0-9 Units Subcutaneous TID WC  . lactose free nutrition  237 mL Oral BID BM  . methocarbamol (ROBAXIN) IV  1,000 mg Intravenous Q8H  . metoCLOPramide (REGLAN) injection  5 mg Intravenous 3 times per day  . micafungin [facility]) IV  100 mg Intravenous Daily  . ondansetron (ZOFRAN) IV  4 mg Intravenous 4 times per day  . OxyCODONE  10 mg Oral Q12H  . pantoprazole (PROTONIX) IV  40 mg Intravenous QHS  . predniSONE  5 mg Oral BID WC  . sodium chloride  10-40 mL Intracatheter Q12H   Continuous Infusions: . sodium chloride 30 mL/hr at 10/17/13 0514  . Marland KitchenTPN (CLINIMIX-E) Adult 40 mL/hr at  10/24/13 1742   And  . fat emulsion 250 mL (10/24/13 1743)  . Marland KitchenTPN (CLINIMIX-E) Adult     And  . fat emulsion        Time spent: 25 minutes    Jamian Andujo  [facility] Pager [phone] If 7PM-7AM, please contact night-coverage at [email or web address], password [facility] 10/25/2013, 12:53 PM  LOS: 17 days

## 2013-10-25 NOTE — Progress Notes (Signed)
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 Azithromycin Rash  . Sulfa Antibiotics Rash  . Hydrocodone-Acetaminophen Nausea And Vomiting    Hallucinating   . 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 SHE DOES NOT WANT ANY BLOOD PRODUCTS OR FRACTIONS   Patient Measurements: Height: 5' 6"  (167.6 cm) Weight: 157 lb 3.2 oz (71.305 kg) IBW/kg (Calculated) : 59.3  Vital Signs: Temp: 98.4 F (36.9 C) (08/01 0430) Temp src: Oral (08/01 0430) BP: 105/57 mmHg (08/01 0430) Pulse Rate: 93 (08/01 0430) Intake/Output from previous day: 07/31 0701 - 08/01 0700 In: 2760 [P.O.:300; I.V.:540; IV Piggyback:1080; TPN:840] Out: 2965 [Urine:2950; Drains:15] Intake/Output from this shift:    Labs:  Recent Labs  10/23/13 0605 10/24/13 0450 10/25/13 0500  WBC 12.8* 10.9* 7.8  HGB 7.2* 7.6* 7.6*  HCT 23.9* 25.5* 25.9*  PLT 668* 706* 580*    Recent Labs  10/23/13 0605 10/24/13 0450 10/25/13 0500  NA 134* 135* 131*  K 3.8 4.0 4.0  CL 98 98 96  CO2 24 23 23   GLUCOSE 106* 110* 94  BUN 10 11 10   CREATININE 0.48* 0.48* 0.52  CALCIUM 8.9 9.2 9.2  MG 1.9  --   --   PHOS 4.0  --   --   PROT 7.8 8.3 8.0  ALBUMIN 2.0* 2.2* 2.1*  AST 142* 96* 87*  ALT 244* 189* 151*  ALKPHOS 501* 487* 431*  BILITOT 0.2* 0.2* 0.3   Estimated Creatinine Clearance: 94.6 ml/min (by C-G formula based on Cr of 0.52).    Recent Labs  10/24/13 1127 10/24/13 1648 10/25/13 0721  GLUCAP 131* 110* 106*   Medications:  Scheduled:  . antiseptic oral rinse  15 mL Mouth Rinse q12n4p  . chlorhexidine  15 mL Mouth Rinse BID  . hydroxychloroquine  200 mg Oral BID  . imipenem-cilastatin  500 mg Intravenous 4 times per day  . insulin aspart  0-9 Units Subcutaneous TID WC  . lactose free nutrition  237 mL Oral BID BM  . methocarbamol (ROBAXIN) IV  1,000 mg Intravenous Q8H  . metoCLOPramide (REGLAN) injection  5  mg Intravenous 3 times per day  . micafungin [facility]) IV  100 mg Intravenous Daily  . ondansetron (ZOFRAN) IV  4 mg Intravenous 4 times per day  . OxyCODONE  10 mg Oral Q12H  . pantoprazole (PROTONIX) IV  40 mg Intravenous QHS  . predniSONE  5 mg Oral BID WC  . sodium chloride  10-40 mL Intracatheter Q12H   Infusions:  . sodium chloride 30 mL/hr at 10/17/13 0514  . Marland KitchenTPN (CLINIMIX-E) Adult 40 mL/hr at 10/24/13 1742   And  . fat emulsion 250 mL (10/24/13 1743)     Nutritional Goals:   RD recs (7/23): Kcal: 2000-2200, Protein: 85-100 gram, Fluid: >/=2100 ml/daily  Clinimix 5/20 at a goal rate of 80 ml/hr + lipids to provide: 96 g/day protein, 2169 Kcal/day.  7/30: change to Clinimix 5/15 at rate of 80 ml/hr + lipids to provide 96g protein and 1843 kcal/day (92% of est calorie goals)  Current nutrition:   Diet: regular diet -50% of meals charted yesterday  IVF: NS @ 30 ml/hr  Clinimix + Lipids at 62m/hr + 128mhr (half of goal rate)  CBGs & Insulin requirements past 24 hours:   CBGs: stable WNL  Novolog sensitive scale q4hr - 1 unit  Assessment:  42 YOF  presents with N/V, weight loss d/t difficulty eating with recent dx of Gastric cancer. Partially obstructing gastric ulcer on EGD. TPN per pharmacy started 7/16. 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    8/1: TPN D# 17 Advanced to regular diet but patient remains with poor po intake. 50% of meal charted yesterday.  TNA reduced to half of goal rate to stimulate appetite.    Renal / Hepatic function: SCr remains low/stable, LFTs and alk phos still elevated but improving.   Electrolytes: Na low but stable (unable to adjust in premix TNA), other WNL.    Pre-Albumin: 15.2 (7/27), 3.1 (7/20), 7.9 (7/17) - reflects depleted protein synthesis; improving  TG 130 (7/27), 146 (7/20), 94 (7/17)  Glucose: At goal < 150.  No hypoglycemia  TPN Access: PICC (placed 7/16)  Plan:   In an effort to  hopefully stimulate patient's diet, TNA has been weaned to half goal rate with plan to discontinue when pt  begins to increase PO intake.   Continue Clinimix E 5/15 at 40 ml/hr and lipids at 10 ml/hr  TNA to contain standard multivitamins and trace elements daily.  Change SSI/CBGs to with meals  TNA lab panels on Mondays & Thursdays.  Ralene Bathe, PharmD, BCPS 10/25/2013, 10:52 AM  Pager: [phone]

## 2013-10-25 NOTE — Progress Notes (Signed)
Patient ID: Andrea Best, female   DOB: 11/06/1970, 43 y.o.   MRN: [id number] [facility] Surgery Progress Note:   15 Days Post-Op  Subjective: Mental status is clear.  Up moving about.  Objective: Vital signs in last 24 hours: Temp:  [98.4 F (36.9 C)-99.7 F (37.6 C)] 98.4 F (36.9 C) (08/01 0430) Pulse Rate:  [93-109] 93 (08/01 0430) Resp:  [16-18] 16 (08/01 0430) BP: (105-108)/(57-63) 105/57 mmHg (08/01 0430) SpO2:  [98 %-99 %] 98 % (08/01 0430)  Intake/Output from previous day: 07/31 0701 - 08/01 0700 In: 2760 [P.O.:300; I.V.:540; IV Piggyback:1080; TPN:840] Out: 2965 [Urine:2950; Drains:15] Intake/Output this shift:    Physical Exam: Work of breathing is normal.  No abdominal pain  Lab Results:  Results for orders placed during the hospital encounter of 10/08/13 (from the past 48 hour(s))  GLUCOSE, CAPILLARY     Status: Abnormal   Collection Time    10/23/13 11:47 AM      Result Value Ref Range   Glucose-Capillary 112 (*) 70 - 99 mg/dL  GLUCOSE, CAPILLARY     Status: Abnormal   Collection Time    10/23/13  4:40 PM      Result Value Ref Range   Glucose-Capillary 127 (*) 70 - 99 mg/dL  CBC     Status: Abnormal   Collection Time    10/24/13  4:50 AM      Result Value Ref Range   WBC 10.9 (*) 4.0 - 10.5 K/uL   RBC 3.29 (*) 3.87 - 5.11 MIL/uL   Hemoglobin 7.6 (*) 12.0 - 15.0 g/dL   HCT 25.5 (*) 36.0 - 46.0 %   MCV 77.5 (*) 78.0 - 100.0 fL   MCH 23.1 (*) 26.0 - 34.0 pg   MCHC 29.8 (*) 30.0 - 36.0 g/dL   RDW 21.3 (*) 11.5 - 15.5 %   Platelets 706 (*) 150 - 400 K/uL  COMPREHENSIVE METABOLIC PANEL     Status: Abnormal   Collection Time    10/24/13  4:50 AM      Result Value Ref Range   Sodium 135 (*) 137 - 147 mEq/L   Potassium 4.0  3.7 - 5.3 mEq/L   Chloride 98  96 - 112 mEq/L   CO2 23  19 - 32 mEq/L   Glucose, Bld 110 (*) 70 - 99 mg/dL   BUN 11  6 - 23 mg/dL   Creatinine, Ser 0.48 (*) 0.50 - 1.10 mg/dL   Calcium 9.2  8.4 - 10.5 mg/dL   Total  Protein 8.3  6.0 - 8.3 g/dL   Albumin 2.2 (*) 3.5 - 5.2 g/dL   AST 96 (*) 0 - 37 U/L   ALT 189 (*) 0 - 35 U/L   Alkaline Phosphatase 487 (*) 39 - 117 U/L   Total Bilirubin 0.2 (*) 0.3 - 1.2 mg/dL   GFR calc non Af Amer >90  >90 mL/min   GFR calc Af Amer >90  >90 mL/min   Comment: (NOTE)     The eGFR has been calculated using the CKD EPI equation.     This calculation has not been validated in all clinical situations.     eGFR's persistently <90 mL/min signify possible Chronic Kidney     Disease.   Anion gap 14  5 - 15  GLUCOSE, CAPILLARY     Status: None   Collection Time    10/24/13  7:29 AM      Result Value Ref Range  Glucose-Capillary 97  70 - 99 mg/dL  GLUCOSE, CAPILLARY     Status: Abnormal   Collection Time    10/24/13 11:27 AM      Result Value Ref Range   Glucose-Capillary 131 (*) 70 - 99 mg/dL  GLUCOSE, CAPILLARY     Status: Abnormal   Collection Time    10/24/13  4:48 PM      Result Value Ref Range   Glucose-Capillary 110 (*) 70 - 99 mg/dL  CBC     Status: Abnormal   Collection Time    10/25/13  5:00 AM      Result Value Ref Range   WBC 7.8  4.0 - 10.5 K/uL   RBC 3.27 (*) 3.87 - 5.11 MIL/uL   Hemoglobin 7.6 (*) 12.0 - 15.0 g/dL   HCT 25.9 (*) 36.0 - 46.0 %   MCV 79.2  78.0 - 100.0 fL   MCH 23.2 (*) 26.0 - 34.0 pg   MCHC 29.3 (*) 30.0 - 36.0 g/dL   RDW 21.6 (*) 11.5 - 15.5 %   Platelets 580 (*) 150 - 400 K/uL  COMPREHENSIVE METABOLIC PANEL     Status: Abnormal   Collection Time    10/25/13  5:00 AM      Result Value Ref Range   Sodium 131 (*) 137 - 147 mEq/L   Potassium 4.0  3.7 - 5.3 mEq/L   Chloride 96  96 - 112 mEq/L   CO2 23  19 - 32 mEq/L   Glucose, Bld 94  70 - 99 mg/dL   BUN 10  6 - 23 mg/dL   Creatinine, Ser 0.52  0.50 - 1.10 mg/dL   Calcium 9.2  8.4 - 10.5 mg/dL   Total Protein 8.0  6.0 - 8.3 g/dL   Albumin 2.1 (*) 3.5 - 5.2 g/dL   AST 87 (*) 0 - 37 U/L   ALT 151 (*) 0 - 35 U/L   Alkaline Phosphatase 431 (*) 39 - 117 U/L   Total Bilirubin  0.3  0.3 - 1.2 mg/dL   GFR calc non Af Amer >90  >90 mL/min   GFR calc Af Amer >90  >90 mL/min   Comment: (NOTE)     The eGFR has been calculated using the CKD EPI equation.     This calculation has not been validated in all clinical situations.     eGFR's persistently <90 mL/min signify possible Chronic Kidney     Disease.   Anion gap 12  5 - 15  GLUCOSE, CAPILLARY     Status: Abnormal   Collection Time    10/25/13  7:21 AM      Result Value Ref Range   Glucose-Capillary 106 (*) 70 - 99 mg/dL    Radiology/Results: No results found.  Anti-infectives: Anti-infectives   Start     Dose/Rate Route Frequency Ordered Stop   10/21/13 1630  micafungin (MYCAMINE) 100 mg in sodium chloride 0.9 % 100 mL IVPB     100 mg 100 mL/hr over 1 Hours Intravenous Daily 10/21/13 1511     10/21/13 1400  fluconazole (DIFLUCAN) IVPB 200 mg  Status:  Discontinued     200 mg 100 mL/hr over 60 Minutes Intravenous Every 24 hours 10/21/13 1207 10/21/13 1511   10/20/13 1800  imipenem-cilastatin (PRIMAXIN) 500 mg in sodium chloride 0.9 % 100 mL IVPB     500 mg 200 mL/hr over 30 Minutes Intravenous 4 times per day 10/20/13 1536     10/20/13 1000  hydroxychloroquine (PLAQUENIL) tablet 200 mg     200 mg Oral 2 times daily 10/20/13 0906     10/19/13 2200  vancomycin (VANCOCIN) IVPB 1000 mg/200 mL premix  Status:  Discontinued     1,000 mg 200 mL/hr over 60 Minutes Intravenous Every 8 hours 10/19/13 1402 10/24/13 1725   10/17/13 2315  vancomycin (VANCOCIN) 1,250 mg in sodium chloride 0.9 % 250 mL IVPB  Status:  Discontinued     1,250 mg 166.7 mL/hr over 90 Minutes Intravenous 3 times per day 10/17/13 2303 10/19/13 1337   10/16/13 0830  vancomycin (VANCOCIN) IVPB 1000 mg/200 mL premix  Status:  Discontinued     1,000 mg 200 mL/hr over 60 Minutes Intravenous 3 times per day 10/16/13 0730 10/17/13 2303   10/14/13 2200  vancomycin (VANCOCIN) IVPB 750 mg/150 ml premix  Status:  Discontinued     750 mg 150 mL/hr  over 60 Minutes Intravenous Every 8 hours 10/14/13 1158 10/16/13 0730   10/14/13 1400  ceFEPIme (MAXIPIME) 1 g in dextrose 5 % 50 mL IVPB  Status:  Discontinued     1 g 100 mL/hr over 30 Minutes Intravenous 3 times per day 10/14/13 1125 10/20/13 1536   10/14/13 1400  vancomycin (VANCOCIN) 1,500 mg in sodium chloride 0.9 % 500 mL IVPB     1,500 mg 250 mL/hr over 120 Minutes Intravenous  Once 10/14/13 1158 10/14/13 1658   10/12/13 1600  levofloxacin (LEVAQUIN) IVPB 500 mg  Status:  Discontinued     500 mg 100 mL/hr over 60 Minutes Intravenous Every 24 hours 10/12/13 1545 10/14/13 1124   10/10/13 1230  ceFAZolin (ANCEF) IVPB 2 g/50 mL premix     2 g 100 mL/hr over 30 Minutes Intravenous  Once 10/10/13 1220 10/10/13 1222   10/10/13 0600  [MAR Hold]  ceFAZolin (ANCEF) IVPB 2 g/50 mL premix     (On MAR Hold since 10/10/13 0659)   2 g 100 mL/hr over 30 Minutes Intravenous On call to O.R. 10/09/13 1244 10/10/13 0743      Assessment/Plan: Problem List: Patient Active Problem List   Diagnosis Date Noted  . Leucocytosis 10/20/2013  . HCAP (healthcare-associated pneumonia) 10/20/2013  . Fever, unspecified 10/12/2013  . Tachycardia 10/12/2013  . Hypomagnesemia 10/11/2013  . Hyperphosphatemia 10/11/2013  . Protein-calorie malnutrition, severe 10/09/2013  . Gastric outlet obstruction 10/08/2013  . Anemia, iron deficiency 10/08/2013  . Gastric cancer 10/07/2013  . Stomach pain 06/12/2013  . Joint pain 07/28/2011    Appears to be getting along well.  No changes in therapy today.  15 Days Post-Op    LOS: 17 days   Matt B. Hassell Done, MD, [facility] [phone] beeper [phone]  10/25/2013 9:10 AM

## 2013-10-26 LAB — GLUCOSE, CAPILLARY
GLUCOSE-CAPILLARY: 100 mg/dL — AB (ref 70–99)
GLUCOSE-CAPILLARY: 104 mg/dL — AB (ref 70–99)
GLUCOSE-CAPILLARY: 118 mg/dL — AB (ref 70–99)
GLUCOSE-CAPILLARY: 118 mg/dL — AB (ref 70–99)
Glucose-Capillary: 98 mg/dL (ref 70–99)

## 2013-10-26 LAB — CBC
HCT: 25.7 % — ABNORMAL LOW (ref 36.0–46.0)
HEMOGLOBIN: 7.7 g/dL — AB (ref 12.0–15.0)
MCH: 23.3 pg — AB (ref 26.0–34.0)
MCHC: 30 g/dL (ref 30.0–36.0)
MCV: 77.9 fL — ABNORMAL LOW (ref 78.0–100.0)
Platelets: 611 10*3/uL — ABNORMAL HIGH (ref 150–400)
RBC: 3.3 MIL/uL — ABNORMAL LOW (ref 3.87–5.11)
RDW: 22.1 % — ABNORMAL HIGH (ref 11.5–15.5)
WBC: 6.8 10*3/uL (ref 4.0–10.5)

## 2013-10-26 MED ORDER — TRACE MINERALS CR-CU-F-FE-I-MN-MO-SE-ZN IV SOLN
INTRAVENOUS | Status: DC
  Administered 2013-10-26: 18:00:00 via INTRAVENOUS
  Filled 2013-10-26: qty 1000

## 2013-10-26 MED ORDER — FAT EMULSION 20 % IV EMUL
250.0000 mL | INTRAVENOUS | Status: DC
  Administered 2013-10-26: 250 mL via INTRAVENOUS
  Filled 2013-10-26: qty 250

## 2013-10-26 MED ORDER — ADULT MULTIVITAMIN W/MINERALS CH
1.0000 | ORAL_TABLET | Freq: Every day | ORAL | Status: DC
  Administered 2013-10-27: 1 via ORAL
  Filled 2013-10-26: qty 1

## 2013-10-26 NOTE — Progress Notes (Signed)
Patient ID: Andrea Best, female   DOB: September 02, 1970, 43 y.o.   MRN: [id number] [facility] Surgery Progress Note:   16 Days Post-Op  Subjective: Mental status is clear.  Feeling much better Objective: Vital signs in last 24 hours: Temp:  [98.1 F (36.7 C)-98.6 F (37 C)] 98.1 F (36.7 C) (08/02 5537) Pulse Rate:  [95-99] 95 (08/02 0613) Resp:  [20] 20 (08/02 0613) BP: (98-112)/(54-62) 112/56 mmHg (08/02 0613) SpO2:  [98 %-99 %] 99 % (08/02 0613)  Intake/Output from previous day: 08/01 0701 - 08/02 0700 In: 4827 [P.O.:300; I.V.:320; IV Piggyback:360; TPN:600] Out: 0786 [Urine:1850; Drains:1] Intake/Output this shift: Total I/O In: 30 [I.V.:30] Out: -   Physical Exam: Work of breathing is normal.  JP drain is not putting out.    Lab Results:  Results for orders placed during the hospital encounter of 10/08/13 (from the past 48 hour(s))  GLUCOSE, CAPILLARY     Status: Abnormal   Collection Time    10/24/13 11:27 AM      Result Value Ref Range   Glucose-Capillary 131 (*) 70 - 99 mg/dL  GLUCOSE, CAPILLARY     Status: Abnormal   Collection Time    10/24/13  4:48 PM      Result Value Ref Range   Glucose-Capillary 110 (*) 70 - 99 mg/dL  CBC     Status: Abnormal   Collection Time    10/25/13  5:00 AM      Result Value Ref Range   WBC 7.8  4.0 - 10.5 K/uL   RBC 3.27 (*) 3.87 - 5.11 MIL/uL   Hemoglobin 7.6 (*) 12.0 - 15.0 g/dL   HCT 25.9 (*) 36.0 - 46.0 %   MCV 79.2  78.0 - 100.0 fL   MCH 23.2 (*) 26.0 - 34.0 pg   MCHC 29.3 (*) 30.0 - 36.0 g/dL   RDW 21.6 (*) 11.5 - 15.5 %   Platelets 580 (*) 150 - 400 K/uL  COMPREHENSIVE METABOLIC PANEL     Status: Abnormal   Collection Time    10/25/13  5:00 AM      Result Value Ref Range   Sodium 131 (*) 137 - 147 mEq/L   Potassium 4.0  3.7 - 5.3 mEq/L   Chloride 96  96 - 112 mEq/L   CO2 23  19 - 32 mEq/L   Glucose, Bld 94  70 - 99 mg/dL   BUN 10  6 - 23 mg/dL   Creatinine, Ser 0.52  0.50 - 1.10 mg/dL   Calcium 9.2  8.4  - 10.5 mg/dL   Total Protein 8.0  6.0 - 8.3 g/dL   Albumin 2.1 (*) 3.5 - 5.2 g/dL   AST 87 (*) 0 - 37 U/L   ALT 151 (*) 0 - 35 U/L   Alkaline Phosphatase 431 (*) 39 - 117 U/L   Total Bilirubin 0.3  0.3 - 1.2 mg/dL   GFR calc non Af Amer >90  >90 mL/min   GFR calc Af Amer >90  >90 mL/min   Comment: (NOTE)     The eGFR has been calculated using the CKD EPI equation.     This calculation has not been validated in all clinical situations.     eGFR's persistently <90 mL/min signify possible Chronic Kidney     Disease.   Anion gap 12  5 - 15  GLUCOSE, CAPILLARY     Status: Abnormal   Collection Time    10/25/13  7:21 AM  Result Value Ref Range   Glucose-Capillary 106 (*) 70 - 99 mg/dL  GLUCOSE, CAPILLARY     Status: Abnormal   Collection Time    10/25/13 12:40 PM      Result Value Ref Range   Glucose-Capillary 115 (*) 70 - 99 mg/dL  GLUCOSE, CAPILLARY     Status: None   Collection Time    10/25/13  5:10 PM      Result Value Ref Range   Glucose-Capillary 99  70 - 99 mg/dL  CBC     Status: Abnormal   Collection Time    10/26/13  3:40 AM      Result Value Ref Range   WBC 6.8  4.0 - 10.5 K/uL   RBC 3.30 (*) 3.87 - 5.11 MIL/uL   Hemoglobin 7.7 (*) 12.0 - 15.0 g/dL   HCT 25.7 (*) 36.0 - 46.0 %   MCV 77.9 (*) 78.0 - 100.0 fL   MCH 23.3 (*) 26.0 - 34.0 pg   MCHC 30.0  30.0 - 36.0 g/dL   RDW 22.1 (*) 11.5 - 15.5 %   Platelets 611 (*) 150 - 400 K/uL  GLUCOSE, CAPILLARY     Status: Abnormal   Collection Time    10/26/13  6:10 AM      Result Value Ref Range   Glucose-Capillary 100 (*) 70 - 99 mg/dL   Comment 1 Documented in Chart     Comment 2 Notify RN    GLUCOSE, CAPILLARY     Status: Abnormal   Collection Time    10/26/13  7:33 AM      Result Value Ref Range   Glucose-Capillary 104 (*) 70 - 99 mg/dL    Radiology/Results: No results found.  Anti-infectives: Anti-infectives   Start     Dose/Rate Route Frequency Ordered Stop   10/21/13 1630  micafungin (MYCAMINE) 100  mg in sodium chloride 0.9 % 100 mL IVPB     100 mg 100 mL/hr over 1 Hours Intravenous Daily 10/21/13 1511     10/21/13 1400  fluconazole (DIFLUCAN) IVPB 200 mg  Status:  Discontinued     200 mg 100 mL/hr over 60 Minutes Intravenous Every 24 hours 10/21/13 1207 10/21/13 1511   10/20/13 1800  imipenem-cilastatin (PRIMAXIN) 500 mg in sodium chloride 0.9 % 100 mL IVPB     500 mg 200 mL/hr over 30 Minutes Intravenous 4 times per day 10/20/13 1536     10/20/13 1000  hydroxychloroquine (PLAQUENIL) tablet 200 mg     200 mg Oral 2 times daily 10/20/13 0906     10/19/13 2200  vancomycin (VANCOCIN) IVPB 1000 mg/200 mL premix  Status:  Discontinued     1,000 mg 200 mL/hr over 60 Minutes Intravenous Every 8 hours 10/19/13 1402 10/24/13 1725   10/17/13 2315  vancomycin (VANCOCIN) 1,250 mg in sodium chloride 0.9 % 250 mL IVPB  Status:  Discontinued     1,250 mg 166.7 mL/hr over 90 Minutes Intravenous 3 times per day 10/17/13 2303 10/19/13 1337   10/16/13 0830  vancomycin (VANCOCIN) IVPB 1000 mg/200 mL premix  Status:  Discontinued     1,000 mg 200 mL/hr over 60 Minutes Intravenous 3 times per day 10/16/13 0730 10/17/13 2303   10/14/13 2200  vancomycin (VANCOCIN) IVPB 750 mg/150 ml premix  Status:  Discontinued     750 mg 150 mL/hr over 60 Minutes Intravenous Every 8 hours 10/14/13 1158 10/16/13 0730   10/14/13 1400  ceFEPIme (MAXIPIME) 1 g in dextrose 5 %  50 mL IVPB  Status:  Discontinued     1 g 100 mL/hr over 30 Minutes Intravenous 3 times per day 10/14/13 1125 10/20/13 1536   10/14/13 1400  vancomycin (VANCOCIN) 1,500 mg in sodium chloride 0.9 % 500 mL IVPB     1,500 mg 250 mL/hr over 120 Minutes Intravenous  Once 10/14/13 1158 10/14/13 1658   10/12/13 1600  levofloxacin (LEVAQUIN) IVPB 500 mg  Status:  Discontinued     500 mg 100 mL/hr over 60 Minutes Intravenous Every 24 hours 10/12/13 1545 10/14/13 1124   10/10/13 1230  ceFAZolin (ANCEF) IVPB 2 g/50 mL premix     2 g 100 mL/hr over 30  Minutes Intravenous  Once 10/10/13 1220 10/10/13 1222   10/10/13 0600  [MAR Hold]  ceFAZolin (ANCEF) IVPB 2 g/50 mL premix     (On MAR Hold since 10/10/13 0659)   2 g 100 mL/hr over 30 Minutes Intravenous On call to O.R. 10/09/13 1244 10/10/13 0743      Assessment/Plan: Problem List: Patient Active Problem List   Diagnosis Date Noted  . Leucocytosis 10/20/2013  . HCAP (healthcare-associated pneumonia) 10/20/2013  . Fever, unspecified 10/12/2013  . Tachycardia 10/12/2013  . Hypomagnesemia 10/11/2013  . Hyperphosphatemia 10/11/2013  . Protein-calorie malnutrition, severe 10/09/2013  . Gastric outlet obstruction 10/08/2013  . Anemia, iron deficiency 10/08/2013  . Gastric cancer 10/07/2013  . Stomach pain 06/12/2013  . Joint pain 07/28/2011    Will likely pull drain tomorrow 16 Days Post-Op    LOS: 18 days   Matt B. Hassell Done, MD, [facility] [phone] beeper [phone]  10/26/2013 9:56 AM

## 2013-10-26 NOTE — Progress Notes (Signed)
[facility] PROGRESS NOTE  Andrea Best SWH:[id number] DOB: [birth year] DOA: 10/08/2013 PCP: Conni Slipper, MD   Brief narrative:  43 y.o. 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 She underwent subtotal gastrectomy with porta hepatis and peripancreatic lymph node dissection with a Roux-en-Y gastrojejunostomy on 10/10/13. Hospital course was complicated due to development of HCAP for which she is on maxipime and vancomycin and now with persistent tachycardia, low grade fever and progressive leucocytosis.  Assessment/Plan: Fever/tachycardia/   leucocytosis/ HCAP  -left LL PNA post op on 7/19. Patient started on Levaquin but changed to primaxin and vanco on 10/14/13. Repeat CXR 10/18/2013 still showing consolidation in the left lower lobe.   -ID consult appreciated . patient ruled out for DVT and PE and persistent tachycardia and leukocytosis. CT of chest and abd shows left pleural effusion and fluid collection inferior to spleen and free pelvic fluid which were drained and sent for culture. Body fluid cx showed yeast on stain ( candida Tropicalis)  and placed on empiric micafungin. ID will recommend duration of treatment. ( has PICC already) -Cx so far negative. Marland Kitchen  dced vancomycin on 7/31. Plan to d/c primaxin upon discharge.  Plan to remove JP drain tomorrow.   Gastric outlet obstruction secondary to gastric cancer  -Status post subtotal gastrectomy with porta hepatis and peripancreatic lymph node dissection with a Roux-en-Y gastrojejunostomy on 10/10/13. Was in SDU post-op and transferred to telemetry...  -Dr. Benay Spice has seen  patient in consultation. Plan on adjuvant chemotherapy as outpt.  -Pain control with oxycodone IR and ER. -on soft diet.  Not met calorie goal  yet and continued TNA. 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 Check iron panel Hemoglobin ranges from 7-8.   Protein-calorie malnutrition Added resource breeze bid. 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 Reports to follow up with her Gyn upon discharge  DVT Prophylaxis  Sq lovenox   Code Status: full code Family Communication: none at bedside Disposition Plan: home once off TNA ( possibly tomorrow)   Consultants:  ID   CCS   Dr Learta Codding  Pulmonary   IR    Procedures:  Subtotal gastrectomy with porta hepatis and peripancreatic lymph node dissection, Roux-en-Y gastrojejunostomy 10/10/13 by Dr. Excell Seltzer.  Aspiration of perisplenic and pelvic fluid on 7/28. Left thoracentesis on 7/28  Antibiotics:  Levaquin 7/19 --> 7/21  primaxin 7/21 --> until discharge Vancomycin 7/21 --7/31 micafungin 7/28--   HPI/Subjective: Denies abdominal pain today   Objective: Filed Vitals:   10/26/13 0613  BP: 112/56  Pulse: 95  Temp: 98.1 F (36.7 C)  Resp: 20    Intake/Output Summary (Last 24 hours) at 10/26/13 1117 Last data filed at 10/26/13 0800  Gross per 24 hour  Intake   1390 ml  Output   1351 ml  Net     39 ml   Filed Weights   10/12/13 0400 10/13/13 0400 10/18/13 0548  Weight: 74.2 kg (163 lb 9.3 oz) 73.4 kg (161 lb 13.1 oz) 71.305 kg (157 lb 3.2 oz)    Exam:   General:  NAD  HEENT: pallor+,  moist mucosa  Chest: clear  b/l,   CVS: N S1&S2 , no Murmurs  Abd: soft, dressing over midline incision clean.  JP drain with milky discharge. BS+, nontender  Ext: warm,    Data Reviewed: Basic Metabolic Panel:  Recent Labs Lab 10/20/13 0500  10/21/13 0510 10/22/13 0410 10/23/13 0605 10/24/13 0450 10/25/13 0500  NA 134* 134* 134* 134* 135* 131*  K 3.8 4.1 4.3 3.8 4.0 4.0  CL 100 98 99 98 98 96  CO2 _0 GLUCOSE 108* 120* 112* 106* 110* 94  BUN _1 CREATININE 0.47* 0.43* 0.44* 0.48* 0.48* 0.52  CALCIUM 8.7 8.9 8.9 8.9 9.2 9.2  MG 1.9  --   --  1.9  --   --   PHOS 4.5  --   --  4.0  --   --    Liver Function Tests:  Recent Labs Lab 10/20/13 0500 10/21/13 0510 10/23/13 0605 10/24/13 0450 10/25/13 0500  AST 142*  --  142* 96* 87*  ALT 131*  --  244* 189* 151*  ALKPHOS 481*  --  501* 487* 431*  BILITOT <0.2*  --  0.2* 0.2* 0.3  PROT 7.7 7.5 7.8 8.3 8.0  ALBUMIN 1.8*  --  2.0* 2.2* 2.1*    Recent Labs Lab 10/19/13 1655 10/20/13 1214  LIPASE 121* 151*  AMYLASE 173*  --    No results found for this basename: AMMONIA,  in the last 168 hours CBC:  Recent Labs Lab 10/20/13 0500  10/22/13 0410 10/23/13 0605 10/24/13 0450 10/25/13 0500 10/26/13 0340  WBC 22.1*  < > 17.5* 12.8* 10.9* 7.8 6.8  NEUTROABS 18.2*  --   --   --   --   --   --   HGB 7.3*  < > 7.2* 7.2* 7.6* 7.6* 7.7*  HCT 23.7*  < > 23.6* 23.9* 25.5* 25.9* 25.7*  MCV 74.8*  < > 77.6* 76.8* 77.5* 79.2 77.9*  PLT 514*  < > 583* 668* 706* 580* 611*  < > = values in this interval not displayed. Cardiac Enzymes: No results found for this basename: CKTOTAL, CKMB, CKMBINDEX, TROPONINI,  in the last 168 hours BNP (last 3 results) No results found for this basename: PROBNP,  in the last 8760 hours CBG:  Recent Labs Lab 10/25/13 0721 10/25/13 1240 10/25/13 1710 10/26/13 0610 10/26/13 0733  GLUCAP 106* 115* 99 100* 104*    Recent Results (from the past 240 hour(s))  CULTURE, BLOOD (ROUTINE X 2)     Status: None   Collection Time    10/19/13  4:55 PM      Result Value Ref Range Status   Specimen Description BLOOD LEFT ARM   Final   Special Requests     Final   Value: BOTTLES DRAWN AEROBIC AND ANAEROBIC 10CC BOTH  BOTTLES   Culture  Setup Time     Final   Value: 10/19/2013 22:40     Performed at [facility]   Culture     Final   Value: NO GROWTH 5 DAYS     Performed at [facility]   Report Status 10/25/2013 FINAL   Final  BODY FLUID CULTURE     Status: None   Collection Time    10/20/13  5:28 PM      Result Value Ref Range Status   Specimen Description OTHER   Final  Special Requests Normal JP DRAIN IN RIGHT ABDOMEN   Final   Gram Stain     Final   Value: MODERATE WBC PRESENT,BOTH PMN AND MONONUCLEAR     YEAST     Performed at [facility]   Culture     Final   Value: MODERATE CANDIDA TROPICALIS     Performed at [facility]   Report Status 10/24/2013 FINAL   Final  CULTURE, ROUTINE-ABSCESS     Status: None   Collection Time    10/21/13 10:58 AM      Result Value Ref Range Status   Specimen Description PERITONEAL CAVITY PERISPLENIC   Final   Special Requests NONE   Final   Gram Stain     Final   Value: FEW WBC PRESENT,BOTH PMN AND MONONUCLEAR     NO ORGANISMS SEEN     Performed at [facility]   Culture     Final   Value: NO GROWTH 3 DAYS     Performed at [facility]   Report Status 10/24/2013 FINAL   Final  BODY FLUID CULTURE     Status: None   Collection Time    10/21/13 10:58 AM      Result Value Ref Range Status   Specimen Description PERITONEAL CAVITY PELVIC   Final   Special Requests NONE   Final   Gram Stain     Final   Value: ABUNDANT WBC PRESENT,BOTH PMN AND MONONUCLEAR     NO ORGANISMS SEEN     Performed at [facility]   Culture     Final   Value: NO GROWTH 3 DAYS     Performed at [facility]   Report Status 10/24/2013 FINAL   Final  FUNGUS CULTURE W SMEAR     Status: None   Collection Time    10/21/13  3:30 PM      Result Value Ref Range Status   Specimen Description PLEURAL   Final   Special Requests Normal   Final   Fungal Smear     Final   Value: NO YEAST OR FUNGAL ELEMENTS SEEN      Performed at [facility]   Culture     Final   Value: CULTURE IN PROGRESS FOR FOUR WEEKS     Performed at [facility]   Report Status PENDING   Incomplete  AFB CULTURE WITH SMEAR     Status: None   Collection Time    10/21/13  3:49 PM      Result Value Ref Range Status   Specimen Description PLEURAL   Final   Special Requests Normal   Final   Acid Fast Smear     Final   Value: NO ACID FAST BACILLI SEEN     Performed at [facility]   Culture     Final   Value: CULTURE WILL BE EXAMINED FOR 6 WEEKS BEFORE ISSUING A FINAL REPORT     Performed at [facility]   Report Status PENDING   Incomplete  BODY FLUID CULTURE     Status: None   Collection Time    10/21/13  3:49 PM      Result Value Ref Range Status   Specimen Description PLEURAL   Final   Special Requests Normal   Final   Gram Stain     Final   Value: FEW WBC PRESENT,BOTH PMN AND MONONUCLEAR     NO ORGANISMS  SEEN     Performed at [facility]     Final   Value: NO GROWTH 3 DAYS     Performed at [facility]   Report Status 10/25/2013 FINAL   Final     Studies: No results found.  Scheduled Meds: . antiseptic oral rinse  15 mL Mouth Rinse q12n4p  . chlorhexidine  15 mL Mouth Rinse BID  . hydroxychloroquine  200 mg Oral BID  . imipenem-cilastatin  500 mg Intravenous 4 times per day  . insulin aspart  0-9 Units Subcutaneous TID WC  . lactose free nutrition  237 mL Oral BID BM  . methocarbamol (ROBAXIN) IV  1,000 mg Intravenous Q8H  . metoCLOPramide (REGLAN) injection  5 mg Intravenous 3 times per day  . micafungin [facility]) IV  100 mg Intravenous Daily  . [START ON 10/27/2013] multivitamin with minerals  1 tablet Oral Daily  . ondansetron (ZOFRAN) IV  4 mg Intravenous 4 times per day  . OxyCODONE  10 mg Oral Q12H  . pantoprazole (PROTONIX) IV  40 mg Intravenous QHS  . predniSONE  5 mg Oral BID WC  . sodium chloride  10-40 mL Intracatheter Q12H   Continuous  Infusions: . sodium chloride 30 mL/hr at 10/25/13 2329  . Marland KitchenTPN (CLINIMIX-E) Adult 40 mL/hr at 10/25/13 1800   And  . fat emulsion 250 mL (10/25/13 1800)  . Marland KitchenTPN (CLINIMIX-E) Adult     And  . fat emulsion        Time spent: 25 minutes    Ima Hafner  [facility] Pager [phone] If 7PM-7AM, please contact night-coverage at [email or web address], password [facility] 10/26/2013, 11:17 AM  LOS: 18 days

## 2013-10-26 NOTE — Progress Notes (Signed)
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 Azithromycin Rash  . Sulfa Antibiotics Rash  . Hydrocodone-Acetaminophen Nausea And Vomiting    Hallucinating   . 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 SHE DOES NOT WANT ANY BLOOD PRODUCTS OR FRACTIONS   Patient Measurements: Height: _0  (167.6 cm) Weight: 157 lb 3.2 oz (71.305 kg) IBW/kg (Calculated) : 59.3  Vital Signs: Temp: 98.1 F (36.7 C) (08/02 7829) Temp src: Oral (08/02 0613) BP: 112/56 mmHg (08/02 5621) Pulse Rate: 95 (08/02 0613) Intake/Output from previous day: 08/01 0701 - 08/02 0700 In: 3086 [P.O.:300; I.V.:290; IV Piggyback:360; TPN:600] Out: 5784 [Urine:1850; Drains:1] Intake/Output from this shift:    Labs:  Recent Labs  10/24/13 0450 10/25/13 0500 10/26/13 0340  WBC 10.9* 7.8 6.8  HGB 7.6* 7.6* 7.7*  HCT 25.5* 25.9* 25.7*  PLT 706* 580* 611*    Recent Labs  10/24/13 0450 10/25/13 0500  NA 135* 131*  K 4.0 4.0  CL 98 96  CO2 23 23  GLUCOSE 110* 94  BUN 11 10  CREATININE 0.48* 0.52  CALCIUM 9.2 9.2  PROT 8.3 8.0  ALBUMIN 2.2* 2.1*  AST 96* 87*  ALT 189* 151*  ALKPHOS 487* 431*  BILITOT 0.2* 0.3   Estimated Creatinine Clearance: 94.6 ml/min (by C-G formula based on Cr of 0.52).    Recent Labs  10/25/13 1710 10/26/13 0610 10/26/13 0733  GLUCAP 99 100* 104*   Medications:  Scheduled:  . antiseptic oral rinse  15 mL Mouth Rinse q12n4p  . chlorhexidine  15 mL Mouth Rinse BID  . hydroxychloroquine  200 mg Oral BID  . imipenem-cilastatin  500 mg Intravenous 4 times per day  . insulin aspart  0-9 Units Subcutaneous TID WC  . lactose free nutrition  237 mL Oral BID BM  . methocarbamol (ROBAXIN) IV  1,000 mg Intravenous Q8H  . metoCLOPramide (REGLAN) injection  5 mg Intravenous 3 times per day  . micafungin [facility]) IV  100 mg Intravenous Daily  . ondansetron (ZOFRAN) IV  4 mg  Intravenous 4 times per day  . OxyCODONE  10 mg Oral Q12H  . pantoprazole (PROTONIX) IV  40 mg Intravenous QHS  . predniSONE  5 mg Oral BID WC  . sodium chloride  10-40 mL Intracatheter Q12H   Infusions:  . sodium chloride 30 mL/hr at 10/25/13 2329  . Marland KitchenTPN (CLINIMIX-E) Adult 40 mL/hr at 10/25/13 1800   And  . fat emulsion 250 mL (10/25/13 1800)     Nutritional Goals:   RD recs (7/23): Kcal: 2000-2200, Protein: 85-100 gram, Fluid: >/=2100 ml/daily  Clinimix 5/20 at a goal rate of 80 ml/hr + lipids to provide: 96 g/day protein, 2169 Kcal/day.  7/30: change to Clinimix 5/15 at rate of 80 ml/hr + lipids to provide 96g protein and 1843 kcal/day (92% of est calorie goals)  Current nutrition:   Diet: regular diet -10% of meals charted yesterday  IVF: NS @ 30 ml/hr  Clinimix + Lipids at 54m/hr + 126mhr (half of goal rate)  CBGs & Insulin requirements past 24 hours:   CBGs: stable WNL  Novolog sensitive scale q4hr - 0 unit  Assessment:  40100OF presents with N/V, weight loss d/t difficulty eating with recent dx of Gastric cancer. Partially obstructing gastric ulcer on EGD. TPN per pharmacy started 7/16. 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  8/2: TPN D#18 Advanced to regular diet but patient remains with poor po intake. 10% of meal charted yesterday.  TNA reduced to half of goal rate to stimulate appetite.  No new labs today. Noted surgery plans to d/c drain tomorrow.   Renal / Hepatic function: SCr remains low/stable, LFTs and alk phos still elevated but improving.   Electrolytes: Na low but stable (unable to adjust in premix TNA), other WNL.    Pre-Albumin: 15.2 (7/27), 3.1 (7/20), 7.9 (7/17) - reflects depleted protein synthesis; improving  TG 130 (7/27), 146 (7/20), 94 (7/17)  Glucose: At goal < 150.  No hypoglycemia  TPN Access: PICC (placed 7/16)  Plan:   In an effort to hopefully stimulate patient's diet, TNA has been weaned to half  goal rate with plan to discontinue when pt  begins to increase PO intake.   Continue Clinimix E 5/15 at 40 ml/hr and lipids at 10 ml/hr  TNA to contain trace elements daily.3  Change MVI to PO  Continue SSI/CBGs with meals  TNA lab panels on Mondays & Thursdays.  F/u daily  Ralene Bathe, PharmD, BCPS 10/26/2013, 8:45 AM  Pager: [phone]

## 2013-10-27 ENCOUNTER — Encounter (INDEPENDENT_AMBULATORY_CARE_PROVIDER_SITE_OTHER): Payer: 59 | Admitting: General Surgery

## 2013-10-27 ENCOUNTER — Other Ambulatory Visit: Payer: Self-pay | Admitting: *Deleted

## 2013-10-27 ENCOUNTER — Telehealth: Payer: Self-pay | Admitting: Nurse Practitioner

## 2013-10-27 DIAGNOSIS — B379 Candidiasis, unspecified: Secondary | ICD-10-CM | POA: Diagnosis not present

## 2013-10-27 DIAGNOSIS — Y95 Nosocomial condition: Secondary | ICD-10-CM

## 2013-10-27 DIAGNOSIS — J189 Pneumonia, unspecified organism: Secondary | ICD-10-CM | POA: Diagnosis present

## 2013-10-27 LAB — DIFFERENTIAL
BASOS ABS: 0 10*3/uL (ref 0.0–0.1)
BASOS PCT: 1 % (ref 0–1)
Eosinophils Absolute: 0.1 10*3/uL (ref 0.0–0.7)
Eosinophils Relative: 2 % (ref 0–5)
Lymphocytes Relative: 20 % (ref 12–46)
Lymphs Abs: 1.1 10*3/uL (ref 0.7–4.0)
MONOS PCT: 20 % — AB (ref 3–12)
Monocytes Absolute: 1.1 10*3/uL — ABNORMAL HIGH (ref 0.1–1.0)
NEUTROS ABS: 3.2 10*3/uL (ref 1.7–7.7)
NEUTROS PCT: 57 % (ref 43–77)

## 2013-10-27 LAB — PHOSPHORUS: Phosphorus: 4.7 mg/dL — ABNORMAL HIGH (ref 2.3–4.6)

## 2013-10-27 LAB — COMPREHENSIVE METABOLIC PANEL
ALBUMIN: 2.4 g/dL — AB (ref 3.5–5.2)
ALK PHOS: 405 U/L — AB (ref 39–117)
ALT: 145 U/L — ABNORMAL HIGH (ref 0–35)
ANION GAP: 14 (ref 5–15)
AST: 89 U/L — AB (ref 0–37)
BUN: 11 mg/dL (ref 6–23)
CO2: 23 mEq/L (ref 19–32)
Calcium: 9.3 mg/dL (ref 8.4–10.5)
Chloride: 96 mEq/L (ref 96–112)
Creatinine, Ser: 0.54 mg/dL (ref 0.50–1.10)
GFR calc Af Amer: >90 mL/min (ref >90)
GFR calc non Af Amer: >90 mL/min (ref >90)
Glucose, Bld: 98 mg/dL (ref 70–99)
POTASSIUM: 4.1 meq/L (ref 3.7–5.3)
Sodium: 133 mEq/L — ABNORMAL LOW (ref 137–147)
TOTAL PROTEIN: 8.3 g/dL (ref 6.0–8.3)
Total Bilirubin: 0.3 mg/dL (ref 0.3–1.2)

## 2013-10-27 LAB — PREALBUMIN: PREALBUMIN: 21.9 mg/dL (ref 17.0–34.0)

## 2013-10-27 LAB — MAGNESIUM: MAGNESIUM: 1.8 mg/dL (ref 1.5–2.5)

## 2013-10-27 LAB — GLUCOSE, CAPILLARY
Glucose-Capillary: 96 mg/dL (ref 70–99)
Glucose-Capillary: 119 mg/dL — ABNORMAL HIGH (ref 70–99)

## 2013-10-27 LAB — TRIGLYCERIDES: Triglycerides: 209 mg/dL — ABNORMAL HIGH (ref <150)

## 2013-10-27 MED ORDER — BOOST PLUS PO LIQD: 237.0000 mL | Freq: Two times a day (BID) | ORAL | Status: DC

## 2013-10-27 MED ORDER — OXYCODONE HCL ER 10 MG PO T12A: 10.0000 mg | EXTENDED_RELEASE_TABLET | Freq: Two times a day (BID) | ORAL | Status: DC

## 2013-10-27 MED ORDER — ADULT MULTIVITAMIN W/MINERALS CH: 1.0000 | ORAL_TABLET | Freq: Every day | ORAL | Status: DC

## 2013-10-27 MED ORDER — OXYCODONE HCL 5 MG PO TABS: 5.0000 mg | ORAL_TABLET | ORAL | Status: DC | PRN

## 2013-10-27 MED ORDER — FLUCONAZOLE 100 MG PO TABS: 100.0000 mg | ORAL_TABLET | Freq: Every day | ORAL | Status: DC

## 2013-10-27 NOTE — Telephone Encounter (Signed)
Per 08/03 POF, r/s visit, could not r/s 07/29 POF due to I could not override the apt and the time was taken by another person had to r/s w/LT...mailed updated schedule to pt...KJ

## 2013-10-27 NOTE — Progress Notes (Signed)
CARE MANAGEMENT NOTE 10/27/2013  Patient:  PLEASANT, BRITZ   Account Number:  [id number]  Date Initiated:  10/09/2013  Documentation initiated by:  [facility]  Subjective/Objective Assessment:   adm:  Stomach cancer with obstruction of the outflow tract of the stomach causing nausea and vomiting     Action/Plan:   dc to SNF   Anticipated DC Date:  10/24/2013   Anticipated DC Plan:  [place]  In-house referral  Clinical Social Worker      DC Forensic scientist  CM consult      [place] Health Choice  HOME HEALTH   Choice offered to / List presented to:  C-1 Patient   DME arranged  HOSPITAL BED      DME agency  [place] arranged  HH-1 RN      [place].   Status of service:  In process, will continue to follow Medicare Important Message given?  NA - LOS <3 / Initial given by admissions (If response is "NO", the following Medicare IM given date fields will be blank) Date Medicare IM given:   Medicare IM given by:   Date Additional Medicare IM given:   Additional Medicare IM given by:    Discharge Disposition:  [place]  Per UR Regulation:  Reviewed for med. necessity/level of care/duration of stay  If discussed at [place] of Stay Meetings, dates discussed:   10/16/2013    Comments:  10/27/13 MMcGibboney, RN, BSN Pt selected [place] and plan to discharge today.   10/18/13 KATHY MAHABIR RN,BSN NCM [phone] [facility] FOLLOWING HHPT-KRISTEN REP AWARE.TPN,NPO,JP-PURULENT OUTPUT.LUQ FLUID COLLECTION.CT ABD/CHEST.IV ABX.  10/17/13 MMcGibboney, RN, BSN Pt selected [place] for HHPT. Referral given to in house rep.  10/13/2013/[name],RN,BSn,CCM: pt moving out of the icu to med surg floor/iv flds,iv tpn,goo surg.post op day 3/npo, no bowel sounds as of yet.  10/09/13 15:55 CM notes consult for med asst.  Pt has [facility] insurance and, unfortunately, is not eligible for med  asst programs (for uninsured).  If a specific medication is ordered upon discharge which is new on the market, sometimes a discount card may be available.  CM will continue to follow for disposiiton needs.  [name], BSN, CM [phone].

## 2013-10-27 NOTE — Progress Notes (Signed)
Patient ID: Andrea Best, female   DOB: [birth year], 43 y.o.   MRN: [id number]  Subjective: Consuming 50% of meals, plus supplements and snacks.  She is hungry.  Ambulating.  Pain well controlled. Wants to go home.   Objective:  Vital signs:  Filed Vitals:   10/26/13 0613 10/26/13 1424 10/26/13 2100 10/27/13 0458  BP: 112/56 105/59 115/60 107/62  Pulse: 95 96 90 97  Temp: 98.1 F (36.7 C) 98.6 F (37 C) 98.6 F (37 C) 98.7 F (37.1 C)  TempSrc: Oral Oral Oral Oral  Resp: 20 20 19 19   Height:      Weight:      SpO2: 99% 100% 100% 99%    Last BM Date: 10/26/13  Intake/Output   Yesterday:  08/02 0701 - 08/03 0700 In: 2711.5 [P.O.:840; I.V.:701.5; IV Piggyback:650; TPN:520] Out: 5374 [Urine:1800; Drains:7] This shift:  Total I/O In: -  Out: 5 [Drains:5]   Physical Exam: General: Pt awake/alert/oriented x4 in no acute distress Abdomen: Soft.  Nondistended.  Non tender.  Midline incision, portion of wound is open, it is beefy red, clean, wound replaced.  JP drain with scant amount of white output, DC'd per Dr. Hassell Done request.   No evidence of peritonitis.  No incarcerated hernias.   Problem List:   Principal Problem:   Gastric outlet obstruction Active Problems:   Gastric cancer   Anemia, iron deficiency   Protein-calorie malnutrition, severe   Hypomagnesemia   Hyperphosphatemia   Fever, unspecified   Tachycardia   Leucocytosis   HCAP (healthcare-associated pneumonia)    Results:   Labs: Results for orders placed during the hospital encounter of 10/08/13 (from the past 48 hour(s))  GLUCOSE, CAPILLARY     Status: Abnormal   Collection Time    10/25/13 12:40 PM      Result Value Ref Range   Glucose-Capillary 115 (*) 70 - 99 mg/dL  GLUCOSE, CAPILLARY     Status: None   Collection Time    10/25/13  5:10 PM      Result Value Ref Range   Glucose-Capillary 99  70 - 99 mg/dL  CBC     Status: Abnormal   Collection Time    10/26/13  3:40 AM   Result Value Ref Range   WBC 6.8  4.0 - 10.5 K/uL   RBC 3.30 (*) 3.87 - 5.11 MIL/uL   Hemoglobin 7.7 (*) 12.0 - 15.0 g/dL   HCT 25.7 (*) 36.0 - 46.0 %   MCV 77.9 (*) 78.0 - 100.0 fL   MCH 23.3 (*) 26.0 - 34.0 pg   MCHC 30.0  30.0 - 36.0 g/dL   RDW 22.1 (*) 11.5 - 15.5 %   Platelets 611 (*) 150 - 400 K/uL  GLUCOSE, CAPILLARY     Status: Abnormal   Collection Time    10/26/13  6:10 AM      Result Value Ref Range   Glucose-Capillary 100 (*) 70 - 99 mg/dL   Comment 1 Documented in Chart     Comment 2 Notify RN    GLUCOSE, CAPILLARY     Status: Abnormal   Collection Time    10/26/13  7:33 AM      Result Value Ref Range   Glucose-Capillary 104 (*) 70 - 99 mg/dL  GLUCOSE, CAPILLARY     Status: Abnormal   Collection Time    10/26/13 12:09 PM      Result Value Ref Range   Glucose-Capillary 118 (*) 70 - 99 mg/dL  GLUCOSE, CAPILLARY     Status: None   Collection Time    10/26/13  4:50 PM      Result Value Ref Range   Glucose-Capillary 98  70 - 99 mg/dL  GLUCOSE, CAPILLARY     Status: Abnormal   Collection Time    10/26/13  9:09 PM      Result Value Ref Range   Glucose-Capillary 118 (*) 70 - 99 mg/dL  COMPREHENSIVE METABOLIC PANEL     Status: Abnormal   Collection Time    10/27/13  4:24 AM      Result Value Ref Range   Sodium 133 (*) 137 - 147 mEq/L   Potassium 4.1  3.7 - 5.3 mEq/L   Chloride 96  96 - 112 mEq/L   CO2 23  19 - 32 mEq/L   Glucose, Bld 98  70 - 99 mg/dL   BUN 11  6 - 23 mg/dL   Creatinine, Ser 0.54  0.50 - 1.10 mg/dL   Calcium 9.3  8.4 - 10.5 mg/dL   Total Protein 8.3  6.0 - 8.3 g/dL   Albumin 2.4 (*) 3.5 - 5.2 g/dL   AST 89 (*) 0 - 37 U/L   ALT 145 (*) 0 - 35 U/L   Alkaline Phosphatase 405 (*) 39 - 117 U/L   Total Bilirubin 0.3  0.3 - 1.2 mg/dL   GFR calc non Af Amer >90  >90 mL/min   GFR calc Af Amer >90  >90 mL/min   Comment: (NOTE)     The eGFR has been calculated using the CKD EPI equation.     This calculation has not been validated in all clinical  situations.     eGFR's persistently <90 mL/min signify possible Chronic Kidney     Disease.   Anion gap 14  5 - 15  MAGNESIUM     Status: None   Collection Time    10/27/13  4:24 AM      Result Value Ref Range   Magnesium 1.8  1.5 - 2.5 mg/dL  PHOSPHORUS     Status: Abnormal   Collection Time    10/27/13  4:24 AM      Result Value Ref Range   Phosphorus 4.7 (*) 2.3 - 4.6 mg/dL  DIFFERENTIAL     Status: Abnormal   Collection Time    10/27/13  4:24 AM      Result Value Ref Range   Neutrophils Relative % 57  43 - 77 %   Neutro Abs 3.2  1.7 - 7.7 K/uL   Lymphocytes Relative 20  12 - 46 %   Lymphs Abs 1.1  0.7 - 4.0 K/uL   Monocytes Relative 20 (*) 3 - 12 %   Monocytes Absolute 1.1 (*) 0.1 - 1.0 K/uL   Eosinophils Relative 2  0 - 5 %   Eosinophils Absolute 0.1  0.0 - 0.7 K/uL   Basophils Relative 1  0 - 1 %   Basophils Absolute 0.0  0.0 - 0.1 K/uL  TRIGLYCERIDES     Status: Abnormal   Collection Time    10/27/13  4:24 AM      Result Value Ref Range   Triglycerides 209 (*) <150 mg/dL   Comment: Performed at [place], CAPILLARY     Status: None   Collection Time    10/27/13  7:30 AM      Result Value Ref Range   Glucose-Capillary 96  70 - 99 mg/dL  Imaging / Studies: No results found.  Scheduled Meds: . antiseptic oral rinse  15 mL Mouth Rinse q12n4p  . chlorhexidine  15 mL Mouth Rinse BID  . hydroxychloroquine  200 mg Oral BID  . imipenem-cilastatin  500 mg Intravenous 4 times per day  . insulin aspart  0-9 Units Subcutaneous TID WC  . lactose free nutrition  237 mL Oral BID BM  . methocarbamol (ROBAXIN) IV  1,000 mg Intravenous Q8H  . metoCLOPramide (REGLAN) injection  5 mg Intravenous 3 times per day  . micafungin [facility]) IV  100 mg Intravenous Daily  . multivitamin with minerals  1 tablet Oral Daily  . ondansetron (ZOFRAN) IV  4 mg Intravenous 4 times per day  . OxyCODONE  10 mg Oral Q12H  . pantoprazole (PROTONIX) IV  40 mg Intravenous  QHS  . predniSONE  5 mg Oral BID WC  . sodium chloride  10-40 mL Intracatheter Q12H   Continuous Infusions: . sodium chloride 30 mL/hr at 10/25/13 2329   PRN Meds:.acetaminophen, albuterol, bisacodyl, HYDROmorphone (DILAUDID) injection, lip balm, ondansetron (ZOFRAN) IV, ondansetron, oxyCODONE, phenol, sodium chloride, sodium chloride, zolpidem  Antibiotics: Anti-infectives   Start     Dose/Rate Route Frequency Ordered Stop   10/21/13 1630  micafungin (MYCAMINE) 100 mg in sodium chloride 0.9 % 100 mL IVPB     100 mg 100 mL/hr over 1 Hours Intravenous Daily 10/21/13 1511     10/21/13 1400  fluconazole (DIFLUCAN) IVPB 200 mg  Status:  Discontinued     200 mg 100 mL/hr over 60 Minutes Intravenous Every 24 hours 10/21/13 1207 10/21/13 1511   10/20/13 1800  imipenem-cilastatin (PRIMAXIN) 500 mg in sodium chloride 0.9 % 100 mL IVPB     500 mg 200 mL/hr over 30 Minutes Intravenous 4 times per day 10/20/13 1536     10/20/13 1000  hydroxychloroquine (PLAQUENIL) tablet 200 mg     200 mg Oral 2 times daily 10/20/13 0906     10/19/13 2200  vancomycin (VANCOCIN) IVPB 1000 mg/200 mL premix  Status:  Discontinued     1,000 mg 200 mL/hr over 60 Minutes Intravenous Every 8 hours 10/19/13 1402 10/24/13 1725   10/17/13 2315  vancomycin (VANCOCIN) 1,250 mg in sodium chloride 0.9 % 250 mL IVPB  Status:  Discontinued     1,250 mg 166.7 mL/hr over 90 Minutes Intravenous 3 times per day 10/17/13 2303 10/19/13 1337   10/16/13 0830  vancomycin (VANCOCIN) IVPB 1000 mg/200 mL premix  Status:  Discontinued     1,000 mg 200 mL/hr over 60 Minutes Intravenous 3 times per day 10/16/13 0730 10/17/13 2303   10/14/13 2200  vancomycin (VANCOCIN) IVPB 750 mg/150 ml premix  Status:  Discontinued     750 mg 150 mL/hr over 60 Minutes Intravenous Every 8 hours 10/14/13 1158 10/16/13 0730   10/14/13 1400  ceFEPIme (MAXIPIME) 1 g in dextrose 5 % 50 mL IVPB  Status:  Discontinued     1 g 100 mL/hr over 30 Minutes  Intravenous 3 times per day 10/14/13 1125 10/20/13 1536   10/14/13 1400  vancomycin (VANCOCIN) 1,500 mg in sodium chloride 0.9 % 500 mL IVPB     1,500 mg 250 mL/hr over 120 Minutes Intravenous  Once 10/14/13 1158 10/14/13 1658   10/12/13 1600  levofloxacin (LEVAQUIN) IVPB 500 mg  Status:  Discontinued     500 mg 100 mL/hr over 60 Minutes Intravenous Every 24 hours 10/12/13 1545 10/14/13 1124   10/10/13 1230  ceFAZolin (  ANCEF) IVPB 2 g/50 mL premix     2 g 100 mL/hr over 30 Minutes Intravenous  Once 10/10/13 1220 10/10/13 1222   10/10/13 0600  [MAR Hold]  ceFAZolin (ANCEF) IVPB 2 g/50 mL premix     (On MAR Hold since 10/10/13 0659)   2 g 100 mL/hr over 30 Minutes Intravenous On call to O.R. 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 Martin(43m out yesterday) -HH for dressing changes -anti-fungal/antibiotics per primary team/ID -follow up with Dr. HExcell Seltzerin 2-3 weeks -stable for discharge from surgical standpoint.    EErby Pian [facility] Pager [phone] [phone] 10/27/2013 9:12 AM

## 2013-10-27 NOTE — Progress Notes (Signed)
Progress is providing the following services: hospital bed  If patient discharges after hours, please call [phone].   Linward Headland 10/27/2013, 11:27 AM

## 2013-10-27 NOTE — Discharge Instructions (Signed)
ABDOMINAL SURGERY: POST OP INSTRUCTIONS ° °1. DIET: Follow a light bland diet the first 24 hours after arrival home, such as soup, liquids, crackers, etc.  Be sure to include lots of fluids daily.  Avoid fast food or heavy meals as your are more likely to get nauseated.  Eat a low fat the next few days after surgery.   °2. Take your usually prescribed home medications unless otherwise directed. °3. PAIN CONTROL: °a. Pain is best controlled by a usual combination of three different methods TOGETHER: °i. Ice/Heat °ii. Over the counter pain medication °iii. Prescription pain medication °b. Most patients will experience some swelling and bruising around the incisions.  Ice packs or heating pads (30-60 minutes up to 6 times a day) will help. Use ice for the first few days to help decrease swelling and bruising, then switch to heat to help relax tight/sore spots and speed recovery.  Some people prefer to use ice alone, heat alone, alternating between ice & heat.  Experiment to what works for you.  Swelling and bruising can take several weeks to resolve.   °c. It is helpful to take an over-the-counter pain medication regularly for the first few weeks.  Choose one of the following that works best for you: °i. Naproxen (Aleve, etc)  Two 220mg tabs twice a day °ii. Ibuprofen (Advil, etc) Three 200mg tabs four times a day (every meal & bedtime) °iii. Acetaminophen (Tylenol, etc) 500-650mg four times a day (every meal & bedtime) °d. A  prescription for pain medication (such as oxycodone, hydrocodone, etc) should be given to you upon discharge.  Take your pain medication as prescribed.  °i. If you are having problems/concerns with the prescription medicine (does not control pain, nausea, vomiting, rash, itching, etc), please call us [phone] to see if we need to switch you to a different pain medicine that will work better for you and/or control your side effect better. °ii. If you need a refill on your pain medication,  please contact your pharmacy.  They will contact our office to request authorization. Prescriptions will not be filled after 5 pm or on week-ends. °4. Avoid getting constipated.  Between the surgery and the pain medications, it is common to experience some constipation.  Increasing fluid intake and taking a fiber supplement (such as Metamucil, Citrucel, FiberCon, MiraLax, etc) 1-2 times a day regularly will usually help prevent this problem from occurring.  A mild laxative (prune juice, Milk of Magnesia, MiraLax, etc) should be taken according to package directions if there are no bowel movements after 48 hours.   °5. Watch out for diarrhea.  If you have many loose bowel movements, simplify your diet to bland foods & liquids for a few days.  Stop any stool softeners and decrease your fiber supplement.  Switching to mild anti-diarrheal medications (Kayopectate, Pepto Bismol) can help.  If this worsens or does not improve, please call us. °6. Wash / shower every day.  You may shower over the incision / wound.  Avoid baths until the skin is fully healed.  Continue to shower over incision(s) after the dressing is off. °7. Remove your waterproof bandages 5 days after surgery.  You may leave the incision open to air.  You may replace a dressing/Band-Aid to cover the incision for comfort if you wish. °8. ACTIVITIES as tolerated:   °a. You may resume regular (light) daily activities beginning the next day--such as daily self-care, walking, climbing stairs--gradually increasing activities as tolerated.  If you can   walk 30 minutes without difficulty, it is safe to try more intense activity such as jogging, treadmill, bicycling, low-impact aerobics, swimming, etc. b. Save the most intensive and strenuous activity for last such as sit-ups, heavy lifting, contact sports, etc  Refrain from any heavy lifting or straining until you are off narcotics for pain control.   c. DO NOT PUSH THROUGH PAIN.  Let pain be your guide: If it  hurts to do something, don't do it.  Pain is your body warning you to avoid that activity for another week until the pain goes down. d. You may drive when you are no longer taking prescription pain medication, you can comfortably wear a seatbelt, and you can safely maneuver your car and apply brakes. e. Dennis Bast may have sexual intercourse when it is comfortable.  9. FOLLOW UP in our office a. Please call [facility] at [phone] to set up an appointment to see your surgeon in the office for a follow-up appointment approximately 1-2 weeks after your surgery. b. Make sure that you call for this appointment the day you arrive home to insure a convenient appointment time. 10. IF YOU HAVE DISABILITY OR FAMILY LEAVE FORMS, BRING THEM TO THE OFFICE FOR PROCESSING.  DO NOT GIVE THEM TO YOUR DOCTOR.   WHEN TO CALL us [phone]: 1. Poor pain control 2. Reactions / problems with new medications (rash/itching, nausea, etc)  3. Fever over 101.5 F (38.5 C) 4. Inability to urinate 5. Nausea and/or vomiting 6. Worsening swelling or bruising 7. Continued bleeding from incision. 8. Increased pain, redness, or drainage from the incision  The clinic staff is available to answer your questions during regular business hours (8:30am-5pm).  Please dont hesitate to call and ask to speak to one of our nurses for clinical concerns.   A surgeon from [facility] is always on call at the hospitals   If you have a medical emergency, go to the nearest emergency room or call 911.    [facility], [address], [place], [place], [place]  [address] ? MAIN: [phone] ? TOLL FREE: [phone] ? FAX [phone] [email or web address]   WOUND CARE  It is important that the wound be kept open.   -Keeping the skin edges apart will allow the wound to gradually heal from the base upwards.   - If the skin edges of the wound close too early, a new fluid pocket can form and  infection can occur. -This is the reason to pack deeper wounds with gauze or ribbon -This is why drained wounds cannot be sewed closed right away  A healthy wound should form a lining of bright red "beefy" granulating tissue that will help shrink the wound and help the edges grow new skin into it.   -A little mucus / yellow discharge is normal (the body's natural way to try and form a scab) and should be gently washed off with soap and water with daily dressing changes.  -Green or foul smelling drainage implies bacterial colonization and can slow wound healing - a short course of antibiotic ointment (3-5 days) can help it clear up.  Call the doctor if it does not improve or worsens  -Avoid use of antibiotic ointments for more than a week as they can slow wound healing over time.    -Sometimes other wound care products will be used to reduce need for dressing changes and/or help clean up dirty wounds -Sometimes the surgeon needs to debride the  wound in the office to remove dead or infected tissue out of the wound so it can heal more quickly and safely.    Change the dressing at least once a day -Wash the wound with mild soap and water gently every day.  It is good to shower or bathe the wound to help it clean out. -Use clean 4x4 gauze for medium/large wounds or ribbon plain NU-gauze for smaller wounds (it does not need to be sterile, just clean) -Keep the raw wound moist with a little saline or KY (saline) gel on the gauze.  -A dry wound will take longer to heal.  -Keep the skin dry around the wound to prevent breakdown and irritation. -Pack the wound down to the base -The goal is to keep the skin apart, not overpack the wound -Use a Q-tip or blunt-tipped kabob stick toothpick to push the gauze down to the base in narrow or deep wounds   -Cover with a clean gauze and tape -paper or Medipore tape tend to be gentle on the skin -rotate the orientation of the tape to avoid repeated stress/trauma on  the skin -using an ACE or Coban wrap on wounds on arms or legs can be used instead.  Complete all antibiotics through the entire prescription to help the infection heal and prevent new places of infection   Returning the see the surgeon is helpful to follow the healing process and help the wound close as fast as possible.

## 2013-10-27 NOTE — Discharge Summary (Signed)
Physician Discharge Summary  Andrea Best RWE:[id number] DOB: September 07, 1970 DOA: 10/08/2013  PCP: Andrea Evener, MD  Admit date: 10/08/2013 Discharge date: 10/27/2013  Time spent: 40 minutes  Recommendations for Outpatient Follow-up:  #1 Discharge home with home health RN for abdominal wound dressing and monitoring.. Patient will be discharged on oral fluconazole until 9/2/ 20/15 #2 Followup with PCP and general surgery as outpatient. Follow up final body fluid culture as outpatient #3 Patient has followup with oncology in one week and will followup with her GYN.   Discharge Diagnoses:  Principal Problem:   Gastric outlet obstruction  Active Problems:   Gastric cancer   Anemia, iron deficiency   Protein-calorie malnutrition, severe   Hypomagnesemia   Hyperphosphatemia   Fever, unspecified   Tachycardia   Leucocytosis   HCAP (healthcare-associated pneumonia)   Candida tropicalis infection   Discharge Condition: Fair  Diet recommendation: Regular  Filed Weights   10/12/13 0400 10/13/13 0400 10/18/13 0548  Weight: 74.2 kg (163 lb 9.3 oz) 73.4 kg (161 lb 13.1 oz) 71.305 kg (157 lb 3.2 oz)    History of present illness:  Please refer to admission H&P from 10/08/2013 for details, but in brief,43 y.o. 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 She underwent subtotal gastrectomy with porta hepatis and peripancreatic lymph node dissection with a Roux-en-Y gastrojejunostomy on 10/10/13. Hospital course was complicated due to development of hospital-acquired pneumonia and persistent fever, tachycardia and leukocytosis and requiring prolonged TNA.   Hospital Course:  Fever/tachycardia/ leucocytosis/ hospital-acquired pneumonia -left LL PNA post op on 7/19. Patient  started on Levaquin but changed to primaxin and vanco on 10/14/13. Repeat CXR 10/18/2013 still showing consolidation in the left lower lobe.  -ID consult appreciated . patient ruled out for DVT and PE and persistent tachycardia and leukocytosis. CT of chest and abd shows left pleural effusion and fluid collection inferior to spleen and free pelvic fluid which were drained and sent for culture. Body fluid cx from 7/27 growing Candida tropicalis. Patient was started on empiric micafungin and after discussing with ID I would discharge her on oral fluconazole for a 4 weeks duration. -4 the cultures have been negative so far..  Discontinued vancomycin on 7/31. We'll discontinue primaxin prior to discharge as she has been treated for almost 14 days. -Patient's JP drain has been removed by surgery today and is stable.  Gastric outlet obstruction secondary to gastric cancer  -Status post subtotal gastrectomy with porta hepatis and peripancreatic lymph node dissection with a Roux-en-Y gastrojejunostomy on 10/10/13. Was in SDU post-op and transferred to telemetry. Had persistent tachycardia while in the hospital possibly due to pain and infection. -Dr. Benay Best has seen patient in consultation. Plan on adjuvant chemotherapy as outpt.  -Pain control with oxycodone IR and ER which she will be discharged on . -Patient placed on TNA for several days postop as she was not meeting her calorie goal . 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 Check iron panel  Hemoglobin ranges from 7-8.   Protein-calorie malnutrition  Added resource breeze bid.   Lupus/Sjogren syndrome  Continue Plaquenil and prednisone    Patient is hemodynamically  stable and can be discharged  home with outpatient followups.  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 Reports to follow up with her Gyn upon discharge   Code Status: full code  Family Communication: none at bedside  Disposition Plan: home with home health RN  Consultants:  ID  CCS  Dr Andrea Best  Pulmonary  IR    Procedures:  Subtotal gastrectomy with porta hepatis and peripancreatic lymph node dissection, Roux-en-Y gastrojejunostomy 10/10/13 by Dr. Excell Best.  Aspiration of perisplenic and pelvic fluid on 7/28. Left thoracentesis on 7/28   Antibiotics:  Levaquin 7/19 --> 7/21  primaxin 7/21 --> 8/3 Vancomycin 7/21 --7/31  micafungin 7/28--8/3 Discharge on oral fluconazole for 4 weeks (until 11/26/2013)     Discharge Exam: Filed Vitals:   10/27/13 0458  BP: 107/62  Pulse: 97  Temp: 98.7 F (37.1 C)  Resp: 19    General: Middle aged female in no acute distress HEENT: pallor+, moist mucosa  Chest: clear b/l, no added sounds CVS: N S1&S2 , no Murmurs  Abd: soft, dressing over midline incision clean. JP drain removed. Nontender, BS+, nontender  Ext: warm, no edema CNS: Alert and oriented   Discharge Instructions You were cared for by a hospitalist during your hospital stay. If you have any questions about your discharge medications or the care you received while you were in the hospital after you are discharged, you can call the unit and asked to speak with the hospitalist on call if the hospitalist that took care of you is not available. Once you are discharged, your primary care physician will handle any further medical issues. Please note that NO REFILLS for any discharge medications will be authorized once you are discharged, as it is imperative that you return to your primary care physician (or establish a relationship with a primary care physician if you do not have one) for your aftercare needs so that they can reassess your need for medications and monitor your lab values.      Medication List         fluconazole 100 MG tablet  Commonly known as:  DIFLUCAN  Take 1 tablet (100 mg total) by mouth daily.until 11/26/2013      hydroxychloroquine 200 MG tablet  Commonly known as:  PLAQUENIL  Take 200 mg by mouth 2 (two) times daily.     lactose free nutrition Liqd  Take 237 mLs by mouth 2 (two) times daily between meals.     lansoprazole 15 MG disintegrating tablet  Commonly known as:  PREVACID SOLUTAB  Take 15 mg by mouth 2 (two) times daily before a meal.     multivitamin with minerals Tabs tablet  Take 1 tablet by mouth daily.     OxyCODONE 10 mg T12a 12 hr tablet  Commonly known as:  OXYCONTIN  Take 1 tablet (10 mg total) by mouth every 12 (twelve) hours.     oxyCODONE 5 MG immediate release tablet  Commonly known as:  Oxy IR/ROXICODONE  Take 1 tablet (5 mg total) by mouth every 4 (four) hours as needed for moderate pain, severe pain or breakthrough pain.     predniSONE 5 MG tablet  Commonly known as:  DELTASONE  Take 5 mg by mouth 2 (two) times daily with a meal.       Allergies  Allergen Reactions  . Azithromycin Rash  . Sulfa Antibiotics Rash  . Hydrocodone-Acetaminophen Nausea And Vomiting    Hallucinating   . Other  PT IS A [employer]EH[employer] WITNESS. SHE DOES NOT WANT ANY BLOOD PRODUCTS OR FRACTIONS       Follow-up Information   Follow up with HOXWORTH,BENJAMIN T, MD In 2 weeks. (2-3 weeks for a post operative check.  subtotal gastrectomy with roux-en-y)    Specialty:  General Surgery   Contact information:   [address] [place] [address] [phone]       Follow up with Andrea Evener, MD. Schedule an appointment as soon as possible for a visit in 1 week.   Specialty:  Family Medicine   Contact information:   [place] [address] [phone]       Follow up with Thurnell Lose, MD. Schedule an appointment as soon as possible for a visit in 4 weeks.   Specialty:  Obstetrics and Gynecology    Contact information:   [place], [place] [address] [phone]        The results of significant diagnostics from this hospitalization (including imaging, microbiology, ancillary and laboratory) are listed below for reference.    Significant Diagnostic Studies: Dg Chest 1 View  10/21/2013   CLINICAL DATA:  LEFT thoracentesis.  EXAM: CHEST - 1 VIEW  COMPARISON:  10/20/2013.  FINDINGS: There is no pneumothorax status post LEFT thoracentesis. LEFT pleural effusion and atelectasis remains present. RIGHT basilar atelectasis. RIGHT upper extremity PICC tip terminates at the junction of the superior vena cava and RIGHT atrium. Surgical clips are present in the upper abdomen. Cardiopericardial silhouette is within normal limits.  IMPRESSION: No pneumothorax status post LEFT thoracentesis. LEFT-greater-than-RIGHT bilateral basilar atelectasis and small LEFT pleural effusion.   Electronically Signed   By: Dereck Ligas M.D.   On: 10/21/2013 16:06   Dg Chest 2 View  10/14/2013   CLINICAL DATA:  Chest pain. Worsening pneumonia. Fever. Shortness of breath.  EXAM: CHEST  2 VIEW  COMPARISON:  Chest x-ray 10/12/2013.  FINDINGS: There is a right upper extremity PICC with tip terminating in the superior aspect of the right atrium. Nasogastric tube in place extending into the mid stomach. Lung volumes are low. Worsening opacification at the base of the left hemithorax, compatible with atelectasis and/or consolidation in the left lower lobe, was superimposed moderate left pleural effusion. Right lung appears clear. No evidence of pulmonary edema. Heart size is within normal limits. Upper mediastinal contours are unremarkable. Numerous surgical clips and skin staples are seen overlying the epigastric region.  IMPRESSION: 1. Worsening atelectasis and/or consolidation in the left lower lobe with increasing moderate left pleural effusion. 2. Support apparatus, as above.   Electronically Signed    By: Vinnie Langton M.D.   On: 10/14/2013 14:14   Ct Chest W Contrast  10/03/2013   CLINICAL DATA:  Gastric cancer.  EXAM: CT CHEST, ABDOMEN, AND PELVIS WITH CONTRAST  TECHNIQUE: Multidetector CT imaging of the chest, abdomen and pelvis was performed following the standard protocol during bolus administration of intravenous contrast.  CONTRAST:  1105mL OMNIPAQUE IOHEXOL 300 MG/ML  SOLN  COMPARISON:  None.  FINDINGS: CT CHEST FINDINGS  No pneumothorax or pleural effusion is noted. Ground-glass opacity measuring 7 x 5 mm is noted posteriorly in the superior segment of the right lower lobe best seen on image number 26 of series 4. 5 mm nodular density is noted in the right upper lobe best seen on image number 22 of series 4. No definite abnormality seen in the left lung. Bilateral axillary adenopathy is noted with the  largest lymph node measuring 13 x 11 mm in the right axillary region. No mediastinal mass or adenopathy is noted. No definite osseous abnormality is noted in the chest.  CT ABDOMEN AND PELVIS FINDINGS  The spleen and pancreas appear normal. No gallstones are noted. Low density area measuring 30 x 21 mm is noted anteriorly in left hepatic lobe with blood vessel running through it most consistent with fatty infiltration. However, another hypodense area measuring 27 x 24 mm is noted lateral to this within the medial segment of the left hepatic lobe. Metastatic disease cannot be excluded given the history of gastric carcinoma. Adrenal glands and kidneys appear normal. No hydronephrosis or renal obstruction is noted. Severe gastric distention is noted with a large amount of complex material seen in the body and antrum of the stomach. This most likely represents a combination of neoplasm and retained food debris. The appendix appears normal. No abnormality seen involving the large or small bowel. The abdominal aorta appears normal. Urinary bladder appears normal. 47 x 42 mm low density is noted in the left  side of the uterine fundus with peripheral enhancement most consistent with uterine fibroid. No significant retroperitoneal adenopathy is noted. Mild bilateral inguinal adenopathy is noted with the largest lymph node measuring 10 x 9 mm in the right side.  IMPRESSION: Distended stomach is noted with large amount of complex material seen in the body and antrum of the stomach most likely representing a combination of neoplasm an retained food debris.  Low density area measuring 30 x 21 mm is noted anteriorly in left hepatic lobe most consistent with fatty infiltration. However, 27 x 24 mm hypodense area is noted lateral to this within the medial segment the left hepatic lobe which potentially may represent metastatic disease; further evaluation with MRI is recommended.  47 x 42 mm low density seen in left side of uterine fundus most consistent with uterine fibroid. Follow-up pelvic ultrasound may be obtained.  Mild bilateral inguinal adenopathy is noted which may simply be reactive or inflammatory in etiology, but given the history of gastric carcinoma, metastatic disease cannot be excluded.  7 x 5 mm ground-glass opacity seen in superior segment of right lower lobe as well as 5 mm nodular density seen in right upper lobe. Also noted is bilateral axillary adenopathy with largest lymph node measuring 13 x 11 mm on the right side ; given the history of gastric carcinoma, metastatic disease cannot be excluded and further evaluation with PET scan is recommended.   Electronically Signed   By: Sabino Dick M.D.   On: 10/03/2013 10:22   Ct Angio Chest Pe W/cm &/or Wo Cm  10/20/2013   CLINICAL DATA:  History of malignant gastric tumor removal now with mid abdominal pain and tachycardia ; history of lupus and Sjogren's disease  EXAM: CT ANGIOGRAPHY CHEST  CT ABDOMEN AND PELVIS WITH CONTRAST  TECHNIQUE: Multidetector CT imaging of the chest was performed using the standard protocol during bolus administration of intravenous  contrast. Multiplanar CT image reconstructions and MIPs were obtained to evaluate the vascular anatomy. Multidetector CT imaging of the abdomen and pelvis was performed using the standard protocol during bolus administration of intravenous contrast.  CONTRAST:  175mL OMNIPAQUE IOHEXOL 350 MG/ML SOLN, 21mL OMNIPAQUE IOHEXOL 300 MG/ML SOLN  COMPARISON:  Portable chest x-ray of today's date CT scan of the chest and abdomen and pelvis of October 03, 2013  FINDINGS: CTA CHEST FINDINGS  Contrast within the pulmonary arterial tree is normal. The  caliber of the thoracic aorta is normal. There is no false lumen. There is a new moderate size left pleural effusion layering posteriorly. There is atelectasis of much of the left lower lobe and atelectasis of the posterior inferior aspect of the right lower lobe. The cardiac chambers are top-normal in size. There is no pericardial effusion. There is a precarinal lymph node which measures 9 mm in short axis. There is no sub carinal, AP window, or axillary lymphadenopathy. There is a small hiatal hernia.  The observed portions of the bony thorax are normal.  CT ABDOMEN and PELVIS FINDINGS  The patient has undergone partial resection of the left hepatic lobe. There is a small amount of fluid at the surgical site. There is a drainage catheter in place which enters the abdominal cavity tip to the right of midline. The tip comes to lie superior to the spleen adjacent to the medial aspect of the remaining portion of the stomach. There are a few normal sized perigastric lymph nodes. There is no bulky lymphadenopathy.  There is minimal intrahepatic ductal dilation. The gallbladder is normal. The pancreas exhibits no inflammatory change or focal mass. There is a thin walled, elliptical fluid collection inferior to the tip of the spleen which measures 8.3 cm AP x 2.5 cm transversely x 4.2 cm longitudinally with HU value of +14. The kidneys and adrenal glands are normal. The periaortic and  pericaval regions are normal.  Contrast has traversed the stomach and reached as far distally as the proximal transverse colon. There is no ileus, obstruction, or acute inflammation. There is small amount of free pelvic fluid. The uterus exhibits heterogeneous density and there is likely a fibroid in the fundus measuring 3.2 cm in diameter. There is a tiny amount of gas within the partially distended urinary bladder. There is no inguinal hernia. There is a small amount of fluid within the midline incision.  The lumbar spine and bony pelvis are unremarkable.  Review of the MIP images confirms the above findings.  IMPRESSION: 1. There is no acute pulmonary embolism nor acute thoracic aortic pathology. 2. Left lower lobe atelectasis and/or pneumonia with moderate-sized left pleural effusion new since July 10th. There mild posterior right lower lobe atelectasis. 3. There are surgical changes from the previous partial left gastrectomy. A drainage catheter is in place in the gastric band and no abnormal fluid collection is seen here. There is a fluid collection inferior to the spleen compatible with a seroma or resolved hematoma. There is a small amount of free pelvic fluid. 4. Postsurgical changes in the left hepatic lobe are noted. A small amount of fluid is present at the surgical site but no abscess is demonstrated. 5. There is no acute bowel abnormality nor acute hepatobiliary abnormality. There is a small amount of fluid in the midline incision.   Electronically Signed   By: David  Martinique   On: 10/20/2013 13:15   Ct Abdomen Pelvis W Contrast  10/20/2013   CLINICAL DATA:  Status post subtotal gastrectomy.  EXAM: CT CHEST, ABDOMEN, AND PELVIS WITH CONTRAST  TECHNIQUE: Multidetector CT imaging of the chest, abdomen and pelvis was performed following the standard protocol during bolus administration of intravenous contrast.  CONTRAST:  151mL OMNIPAQUE IOHEXOL 350 MG/ML SOLN, 77mL OMNIPAQUE IOHEXOL 300 MG/ML SOLN   COMPARISON:  CT scan of October 03, 2013.  FINDINGS: CT CHEST FINDINGS  No pneumothorax is noted. Large left pleural effusion is noted with atelectasis of the left lower lobe. Mild right posterior  basilar subsegmental atelectasis is noted. Right-sided PICC line is noted with distal tip at the cavoatrial junction. There is no evidence of pulmonary embolus. Thoracic aorta and great vessels appear normal without evidence of dissection or aneurysm. Bilateral axillary adenopathy is noted which is unchanged compared to prior exam, with the largest measuring 21 x 11 mm on the right side. Precarinal lymph node measuring 12 x 9 mm is noted which most likely is benign.  CT ABDOMEN AND PELVIS FINDINGS  No gallstones are noted. Patient is status post partial gastrectomy with Roux en Y procedure. The spleen and pancreas appear normal. The low density seen in the left hepatic lobe on prior exam is significantly improved compared to prior exam and is most consistent with focal fatty infiltration as opposed to neoplasm. No other abnormality seen in hepatic parenchyma. Adrenal glands and kidneys appear normal. No hydronephrosis or renal obstruction is in noted. There is noted a lenticular shaped fluid collection measuring 8.5 x 2.8 cm inferior to the spleen. Surgical drain remains within the epigastric region. Expected amount of postoperative fluid is seen in the epigastric region anteriorly. Minimal amount of fluid is noted in the right pericolic gutter. There is no evidence of bowel obstruction. Urinary bladder appears normal. Continued presence of probable fibroid is noted and uterine fundus. Interval development of fluid collection with enhancing margins is noted in the presacral space of the pelvis measuring 3.9 x 3.1 cm. Anterior midline surgical incision is noted with a small amount of associated fluid.  IMPRESSION: No evidence of pulmonary embolus.  Large left pleural effusion is noted with associated atelectasis of the left  lower lobe.  Stable bilateral axillary and inguinal adenopathy is noted which which may be reactive, but metastatic disease cannot be excluded. PET scan may be performed further evaluation.  Status post partial gastrectomy with expected postoperative changes. Surgical drain remains in the epigastric region.  Abnormality noted in left hepatic lobe on prior exam is significantly improved to most likely represents focal fatty infiltration.  Stable uterine fibroid.  There is noted 8.5 x 2.8 cm lenticular shaped fluid collection inferior to the spleen concerning for possible abscess or seroma. Also noted is 3.9 x 3.1 cm fluid collection seen in the presacral space of the posterior pelvis which may represent abscess is well. Critical Value/emergent results were called by telephone at the time of interpretation on 10/20/2013 at 1:37 pm to [facility] , who verbally acknowledged these results.   Electronically Signed   By: Sabino Dick M.D.   On: 10/20/2013 13:39   Ct Abdomen Pelvis W Contrast  10/03/2013   CLINICAL DATA:  Gastric cancer.  EXAM: CT CHEST, ABDOMEN, AND PELVIS WITH CONTRAST  TECHNIQUE: Multidetector CT imaging of the chest, abdomen and pelvis was performed following the standard protocol during bolus administration of intravenous contrast.  CONTRAST:  124mL OMNIPAQUE IOHEXOL 300 MG/ML  SOLN  COMPARISON:  None.  FINDINGS: CT CHEST FINDINGS  No pneumothorax or pleural effusion is noted. Ground-glass opacity measuring 7 x 5 mm is noted posteriorly in the superior segment of the right lower lobe best seen on image number 26 of series 4. 5 mm nodular density is noted in the right upper lobe best seen on image number 22 of series 4. No definite abnormality seen in the left lung. Bilateral axillary adenopathy is noted with the largest lymph node measuring 13 x 11 mm in the right axillary region. No mediastinal mass or adenopathy is noted. No definite osseous abnormality is  noted in the chest.  CT ABDOMEN AND PELVIS  FINDINGS  The spleen and pancreas appear normal. No gallstones are noted. Low density area measuring 30 x 21 mm is noted anteriorly in left hepatic lobe with blood vessel running through it most consistent with fatty infiltration. However, another hypodense area measuring 27 x 24 mm is noted lateral to this within the medial segment of the left hepatic lobe. Metastatic disease cannot be excluded given the history of gastric carcinoma. Adrenal glands and kidneys appear normal. No hydronephrosis or renal obstruction is noted. Severe gastric distention is noted with a large amount of complex material seen in the body and antrum of the stomach. This most likely represents a combination of neoplasm and retained food debris. The appendix appears normal. No abnormality seen involving the large or small bowel. The abdominal aorta appears normal. Urinary bladder appears normal. 47 x 42 mm low density is noted in the left side of the uterine fundus with peripheral enhancement most consistent with uterine fibroid. No significant retroperitoneal adenopathy is noted. Mild bilateral inguinal adenopathy is noted with the largest lymph node measuring 10 x 9 mm in the right side.  IMPRESSION: Distended stomach is noted with large amount of complex material seen in the body and antrum of the stomach most likely representing a combination of neoplasm an retained food debris.  Low density area measuring 30 x 21 mm is noted anteriorly in left hepatic lobe most consistent with fatty infiltration. However, 27 x 24 mm hypodense area is noted lateral to this within the medial segment the left hepatic lobe which potentially may represent metastatic disease; further evaluation with MRI is recommended.  47 x 42 mm low density seen in left side of uterine fundus most consistent with uterine fibroid. Follow-up pelvic ultrasound may be obtained.  Mild bilateral inguinal adenopathy is noted which may simply be reactive or inflammatory in etiology,  but given the history of gastric carcinoma, metastatic disease cannot be excluded.  7 x 5 mm ground-glass opacity seen in superior segment of right lower lobe as well as 5 mm nodular density seen in right upper lobe. Also noted is bilateral axillary adenopathy with largest lymph node measuring 13 x 11 mm on the right side ; given the history of gastric carcinoma, metastatic disease cannot be excluded and further evaluation with PET scan is recommended.   Electronically Signed   By: Sabino Dick M.D.   On: 10/03/2013 10:22   Ct Aspiration  10/21/2013   CLINICAL DATA:  Abdominal and pelvic abscesses  EXAM: CT GUIDED ASPIRATION BIOPSY OF ABDOMINAL AND PELVIC ABSCESSES  ANESTHESIA/SEDATION: Four  Mg IV Versed; 100 mcg IV Fentanyl  Total Moderate Sedation Time: 30 minutes.  PROCEDURE: The procedure risks, benefits, and alternatives were explained to the patient. Questions regarding the procedure were encouraged and answered. The patient understands and consents to the procedure.  The abdomen was prepped with Betadinein a sterile fashion, and a sterile drape was applied covering the operative field. A sterile gown and sterile gloves were used for the procedure. Local anesthesia was provided with 1% Lidocaine.  Under CT guidance, an 18 gauge needle was advanced into the perisplenic fluid collection. 15 cc serous fluid was aspirated and sent for culture.  In the prone position, the right gluteal region was prepped and draped in a sterile fashion. Under CT guidance, a second 18 gauge needle was advanced into the pelvic fluid collection via right trans gluteal approach. 12 cc cloudy reddish fluid was  aspirated. Samples were sent for culture.  Imaging with documentation was performed.  Complications: None.  FINDINGS: The series of images demonstrate needle placement into the perisplenic and pelvic fluid collections. Post aspiration imaging demonstrates near resolution of the fluid collections.  IMPRESSION: Successful  aspiration of 2 fluid collections as described above. The perisplenic aspiration yielded serous fluid in the pelvic aspiration yielded purulent bloody fluid.   Electronically Signed   By: Maryclare Bean M.D.   On: 10/21/2013 12:26   Ct Aspiration  10/21/2013   CLINICAL DATA:  Abdominal and pelvic abscesses  EXAM: CT GUIDED ASPIRATION BIOPSY OF ABDOMINAL AND PELVIC ABSCESSES  ANESTHESIA/SEDATION: Four  Mg IV Versed; 100 mcg IV Fentanyl  Total Moderate Sedation Time: 30 minutes.  PROCEDURE: The procedure risks, benefits, and alternatives were explained to the patient. Questions regarding the procedure were encouraged and answered. The patient understands and consents to the procedure.  The abdomen was prepped with Betadinein a sterile fashion, and a sterile drape was applied covering the operative field. A sterile gown and sterile gloves were used for the procedure. Local anesthesia was provided with 1% Lidocaine.  Under CT guidance, an 18 gauge needle was advanced into the perisplenic fluid collection. 15 cc serous fluid was aspirated and sent for culture.  In the prone position, the right gluteal region was prepped and draped in a sterile fashion. Under CT guidance, a second 18 gauge needle was advanced into the pelvic fluid collection via right trans gluteal approach. 12 cc cloudy reddish fluid was aspirated. Samples were sent for culture.  Imaging with documentation was performed.  Complications: None.  FINDINGS: The series of images demonstrate needle placement into the perisplenic and pelvic fluid collections. Post aspiration imaging demonstrates near resolution of the fluid collections.  IMPRESSION: Successful aspiration of 2 fluid collections as described above. The perisplenic aspiration yielded serous fluid in the pelvic aspiration yielded purulent bloody fluid.   Electronically Signed   By: Maryclare Bean M.D.   On: 10/21/2013 12:26   Dg Chest Port 1 View  10/18/2013   CLINICAL DATA:  Chest pain  EXAM: PORTABLE  CHEST - 1 VIEW  COMPARISON:  October 14, 2013  FINDINGS: Nasogastric tube is been removed. Central catheter tip is at the cavoatrial junction. No pneumothorax. There is consolidation in the left base with small left effusion. There is atelectatic change in the right base. Heart is mildly enlarged with normal pulmonary vascularity. No adenopathy.  IMPRESSION: Airspace consolidation left lower lobe with small left effusion. Right base atelectasis. Central catheter tip at cavoatrial junction. No pneumothorax. No change in cardiac silhouette.   Electronically Signed   By: Lowella Grip M.D.   On: 10/18/2013 14:16   Dg Chest Port 1 View  10/12/2013   CLINICAL DATA:  Fever and cough.  Asthma.  EXAM: PORTABLE CHEST - 1 VIEW  COMPARISON:  10/08/2013  FINDINGS: Nasogastric tube is seen with tip in the mid stomach. A right arm PICC line is seen with tip overlying the proximal to mid right atrium. Low lung volumes are seen with bibasilar opacity which may be due to atelectasis or pneumonia. No evidence of pleural effusion.  IMPRESSION: Low lung volumes with bibasilar atelectasis or pneumonia.   Electronically Signed   By: Earle Gell M.D.   On: 10/12/2013 09:30   Dg Chest Port 1 View  10/08/2013   CLINICAL DATA:  Dry cough.  Evaluate for infiltrate  EXAM: PORTABLE CHEST - 1 VIEW  COMPARISON:  06/30/2007  FINDINGS: Normal heart size and mediastinal contours. No consolidation or edema. Nodular densities seen on preceding chest CT are not clearly visible. No effusion or pneumothorax. No acute osseous findings.  IMPRESSION: No active disease.   Electronically Signed   By: Jorje Guild M.D.   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 Postop day 5. Abdomen pain, leukocytosis, query anastomotic leak. Initial encounter.  EXAM: WATER SOLUBLE UPPER GI SERIES  TECHNIQUE: Single-column upper GI series  was performed using water soluble contrast.  CONTRAST:  14mL OMNIPAQUE IOHEXOL 300 MG/ML  SOLN  COMPARISON:  Preoperative CT Abdomen and Pelvis 10/03/2013.  FLUOROSCOPY TIME:  1 min and 16 seconds.  FINDINGS: Preprocedural scout view of the abdomen. NG tube in place in the left upper quadrant. Right abdominal approach postoperative drain. Numerous surgical clips to the right of the spine. Multiple staple lines in the left upper quadrant. Midline abdominal staples. Paucity of bowel gas. Non obstructed bowel gas pattern.  50 mL of Omnipaque 300 was injected into the patient NG tube and followed by 50 mL of water. Filling of the proximal stomach remnant occurred as expected. A small volume of gastroesophageal reflux occurred tracking proximally along the course of the tube. The patient was mildly symptomatic to this.  After a short delay of to min there is faint contrast visible in the jejunostomy loop (series 9-11 arrows). After an additional 15 min delay, more robust opacification of the jejunostomy loop is noted (image 16). No abnormal accumulation of contrast identified. No contrast within the nearby surgical drain.  IMPRESSION: 1. No evidence of anastomotic leak. Slow transit of contrast from the stomach to the jejunostomy loop. 2. Small volume gastroesophageal reflux to which the patient was mildly symptomatic.   Electronically Signed   By: Lars Pinks M.D.   On: 10/15/2013 12:34   US Thoracentesis Asp Pleural Space W/img Guide  10/21/2013   CLINICAL DATA:  Left-sided pleural effusion. Request diagnostic and therapeutic thoracentesis.  EXAM: ULTRASOUND GUIDED LEFT THORACENTESIS  COMPARISON:  None.  PROCEDURE: An ultrasound guided thoracentesis was thoroughly discussed with the patient and questions answered. The benefits, risks, alternatives and complications were also discussed. The patient understands and wishes to proceed with the procedure. Written consent was obtained.  Ultrasound was performed to localize  and mark an adequate pocket of fluid in the left chest. The area was then prepped and draped in the normal sterile fashion. 1% Lidocaine was used for local anesthesia. Under ultrasound guidance a 19 gauge Yueh catheter was introduced. Thoracentesis was performed. The catheter was removed and a dressing applied.  Complications:  None immediate  FINDINGS: A total of approximately 270 mL of hazy, dark yellow fluid was removed. A fluid sample wassent for laboratory analysis.  IMPRESSION: Successful ultrasound guided left thoracentesis yielding 270 mL of pleural fluid.  Read by: Ascencion Dike PA-C   Electronically Signed   By: Maryclare Bean M.D.   On: 10/21/2013 15:54    Microbiology: Recent Results (from the past 240 hour(s))  CULTURE, BLOOD (ROUTINE X 2)     Status: None   Collection Time    10/19/13  4:55 PM      Result Value Ref Range Status   Specimen Description BLOOD LEFT ARM   Final   Special Requests     Final   Value: BOTTLES DRAWN AEROBIC AND ANAEROBIC 10CC BOTH  BOTTLES   Culture  Setup Time     Final   Value: 10/19/2013 22:40     Performed at [facility]   Culture     Final   Value: NO GROWTH 5 DAYS     Performed at [facility]   Report Status 10/25/2013 FINAL   Final  BODY FLUID CULTURE     Status: None   Collection Time    10/20/13  5:28 PM      Result Value Ref Range Status   Specimen Description OTHER   Final   Special Requests Normal JP DRAIN IN RIGHT ABDOMEN   Final   Gram Stain     Final   Value: MODERATE WBC PRESENT,BOTH PMN AND MONONUCLEAR     YEAST     Performed at [facility]   Culture     Final   Value: MODERATE CANDIDA TROPICALIS     Performed at [facility]   Report Status 10/24/2013 FINAL   Final  CULTURE, ROUTINE-ABSCESS     Status: None   Collection Time    10/21/13 10:58 AM      Result Value Ref Range Status   Specimen Description PERITONEAL CAVITY PERISPLENIC   Final   Special Requests NONE   Final   Gram Stain     Final    Value: FEW WBC PRESENT,BOTH PMN AND MONONUCLEAR     NO ORGANISMS SEEN     Performed at [facility]   Culture     Final   Value: NO GROWTH 3 DAYS     Performed at [facility]   Report Status 10/24/2013 FINAL   Final  BODY FLUID CULTURE     Status: None   Collection Time    10/21/13 10:58 AM      Result Value Ref Range Status   Specimen Description PERITONEAL CAVITY PELVIC   Final   Special Requests NONE   Final   Gram Stain     Final   Value: ABUNDANT WBC PRESENT,BOTH PMN AND MONONUCLEAR     NO ORGANISMS SEEN     Performed at [facility]   Culture     Final   Value: NO GROWTH 3 DAYS     Performed at [facility]   Report Status 10/24/2013 FINAL   Final  FUNGUS CULTURE W SMEAR     Status: None   Collection Time    10/21/13  3:30 PM      Result Value Ref Range Status   Specimen Description PLEURAL   Final   Special Requests Normal   Final   Fungal Smear     Final   Value: NO YEAST OR FUNGAL ELEMENTS SEEN     Performed at [facility]   Culture     Final   Value: CULTURE IN PROGRESS FOR FOUR WEEKS     Performed at [facility]   Report Status PENDING   Incomplete  AFB CULTURE WITH SMEAR     Status: None   Collection Time    10/21/13  3:49 PM      Result Value Ref Range Status   Specimen Description PLEURAL   Final   Special Requests Normal   Final   Acid Fast Smear     Final   Value: NO ACID FAST BACILLI SEEN     Performed at [facility]   Culture     Final   Value:  CULTURE WILL BE EXAMINED FOR 6 WEEKS BEFORE ISSUING A FINAL REPORT     Performed at [facility]   Report Status PENDING   Incomplete  BODY FLUID CULTURE     Status: None   Collection Time    10/21/13  3:49 PM      Result Value Ref Range Status   Specimen Description PLEURAL   Final   Special Requests Normal   Final   Gram Stain     Final   Value: FEW WBC PRESENT,BOTH PMN AND MONONUCLEAR     NO ORGANISMS SEEN     Performed at [facility]   Culture     Final   Value: NO GROWTH 3 DAYS     Performed at [facility]   Report Status 10/25/2013 FINAL   Final     Labs: Basic Metabolic Panel:  Recent Labs Lab 10/22/13 0410 10/23/13 0605 10/24/13 0450 10/25/13 0500 10/27/13 0424  NA 134* 134* 135* 131* 133*  K 4.3 3.8 4.0 4.0 4.1  CL 99 98 98 96 96  CO2 24 24 23 23 23   GLUCOSE 112* 106* 110* 94 98  BUN 10 10 11 10 11   CREATININE 0.44* 0.48* 0.48* 0.52 0.54  CALCIUM 8.9 8.9 9.2 9.2 9.3  MG  --  1.9  --   --  1.8  PHOS  --  4.0  --   --  4.7*   Liver Function Tests:  Recent Labs Lab 10/21/13 0510 10/23/13 0605 10/24/13 0450 10/25/13 0500 10/27/13 0424  AST  --  142* 96* 87* 89*  ALT  --  244* 189* 151* 145*  ALKPHOS  --  501* 487* 431* 405*  BILITOT  --  0.2* 0.2* 0.3 0.3  PROT 7.5 7.8 8.3 8.0 8.3  ALBUMIN  --  2.0* 2.2* 2.1* 2.4*    Recent Labs Lab 10/20/13 1214  LIPASE 151*   No results found for this basename: AMMONIA,  in the last 168 hours CBC:  Recent Labs Lab 10/22/13 0410 10/23/13 0605 10/24/13 0450 10/25/13 0500 10/26/13 0340 10/27/13 0424  WBC 17.5* 12.8* 10.9* 7.8 6.8  --   NEUTROABS  --   --   --   --   --  3.2  HGB 7.2* 7.2* 7.6* 7.6* 7.7*  --   HCT 23.6* 23.9* 25.5* 25.9* 25.7*  --   MCV 77.6* 76.8* 77.5* 79.2 77.9*  --   PLT 583* 668* 706* 580* 611*  --    Cardiac Enzymes: No results found for this basename: CKTOTAL, CKMB, CKMBINDEX, TROPONINI,  in the last 168 hours BNP: BNP (last 3 results) No results found for this basename: PROBNP,  in the last 8760 hours CBG:  Recent Labs Lab 10/26/13 0733 10/26/13 1209 10/26/13 1650 10/26/13 2109 10/27/13 0730  GLUCAP 104* 118* 98 118* 96       Signed:  Misty Foutz  [facility] 10/27/2013, 10:59 AM

## 2013-10-31 ENCOUNTER — Ambulatory Visit (INDEPENDENT_AMBULATORY_CARE_PROVIDER_SITE_OTHER): Payer: 59 | Admitting: General Surgery

## 2013-11-03 ENCOUNTER — Telehealth: Payer: Self-pay | Admitting: *Deleted

## 2013-11-03 ENCOUNTER — Ambulatory Visit (HOSPITAL_BASED_OUTPATIENT_CLINIC_OR_DEPARTMENT_OTHER): Payer: 59 | Admitting: Nurse Practitioner

## 2013-11-03 ENCOUNTER — Telehealth: Payer: Self-pay | Admitting: Nurse Practitioner

## 2013-11-03 ENCOUNTER — Encounter: Payer: Self-pay | Admitting: Radiation Oncology

## 2013-11-03 VITALS — BP 114/60 | HR 89 | Temp 98.0°F | Resp 18 | Ht 66.0 in | Wt 151.8 lb

## 2013-11-03 DIAGNOSIS — C163 Malignant neoplasm of pyloric antrum: Secondary | ICD-10-CM

## 2013-11-03 DIAGNOSIS — D509 Iron deficiency anemia, unspecified: Secondary | ICD-10-CM

## 2013-11-03 DIAGNOSIS — R634 Abnormal weight loss: Secondary | ICD-10-CM

## 2013-11-03 DIAGNOSIS — C169 Malignant neoplasm of stomach, unspecified: Secondary | ICD-10-CM

## 2013-11-03 DIAGNOSIS — R109 Unspecified abdominal pain: Secondary | ICD-10-CM

## 2013-11-03 MED ORDER — OXYCODONE HCL 5 MG PO TABS: 5.0000 mg | ORAL_TABLET | ORAL | Status: DC | PRN

## 2013-11-03 NOTE — Progress Notes (Signed)
GI Location of Tumor / Histology : Gastric Cancer   [name] presented  months ago with symptoms [id number] nausea/vomiting and bloating, weight loss/fatigue  Biopsies of  (if applicable) revealed: Diagnosis 09/29/13: 1. Cervix, biopsy, 6 o'clock- HIGH GRADE SQUAMOUS INTRAEPITHELIAL LESION, CIN-II (MODERATE DYSPLASIA) WITH PROMINENT HYPERKERATOSIS.2. Cervix, biopsy, 12 o'clock- AT LEAST HIGH GRADE SQUAMOUS INTRAEPITHELIAL LESION, CIN-II (MODERATE DYSPLASIA) WITH PROMINENT HYPERKERATOSIS.3. Endocervix, curettage- MUCUS AND SCANT BENIGN ENDOCERVICAL EPITHELIUM.- NO SQUAMOUS EPITHELIUM PRESENT.   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  Past/Anticipated interventions by surgeon, if [id number] : 10/10/13 1. Lymph node, biopsy, peri gastric- POSITIVE FOR METASTATIC ADENOCARCINOMA (1/1).2. 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 - NO DYSPLASIA OR MALIGNANCY IDENTIFIED.3. Stomach, resection for tumor, distal stomach and duodenum- INVASIVE MODERATELY TO POORLY DIFFERENTIATED ADENOCARCINOMA SPANNING 5 CM IN GREATEST DIMENSION.- TUMOR INVADES THROUGH MUSCULARIS PROPRIA TO INVOLVE SEROSAL SURFACE.- PROXIMAL AND DISTAL MARGINS ARE NEGATIVE. - NINE OF FIFTEEN LYMPH NODES POSITIVE FOR METASTATIC ADENOCARCINOMA (9/15).- SEE ONCOLOGY TEMPLATE.4. Lymph node, biopsy, portal lymph node- ONE BENIGN LYMPH NODE WITH NO TUMOR SEEN (0/1).- SEE COMMENT.5. 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 Excell Seltzer, MD, p/o recheck on 11/12/13  Past/Anticipated interventions by medical oncology, if any:Dr.Sherril seen 10/09/13 in hospital  Will need follow up  Weight changes, if any: yes, wight  Loss, transitioned to  regular diet,resource breeze bid  Bowel/Bladder complaints, if [id number],  Belching, Chronic GI bleeding from gastric cancer, abdominal abscess drained  10/21/13 , no bleeding or discharge  At present from voiding, bowel movements improving,   Nausea / Vomiting, if any: no  Pain issues, if any:  Abdominal, has home health RN for wound dressing and monitoring ,mother  Is doing the dressing wet to dry dressing changes in abdominal wound, dressing is dry, checked skin, pink inside wound, goes deep about 2 inches stated patient,   SAFETY ISSUES:  Prior radiation?no  Pacemaker/ICD? no  Possible current pregnancy? no  Is the patient on methotrexate? no  Current Complaints / other details: C-Section  1997,  [place], daughter ,  [place] witness,  no blood products, Left thoracentesis 10/21/13,  Non smoker, no smokeless tobacco or illicit drug use, does drink alcohol, Mother  HTN and DM, and Maternal grandmother with DM  Follow up with GYN Dr. Drenda Freeze ASAP

## 2013-11-03 NOTE — Telephone Encounter (Signed)
, °

## 2013-11-03 NOTE — Progress Notes (Addendum)
[facility] OFFICE PROGRESS NOTE   Diagnosis:  Gastric cancer.  INTERVAL HISTORY:   Ms. Fuquay returns as scheduled. She underwent a subtotal gastrectomy and lymph node dissection with creation of a gastrojejunostomy on 10/10/2013. There was no evidence of distant metastatic disease noted at the time of surgery. No evidence of liver metastases. 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 Tumor involved the serosal surface. Resection margins were negative. 10 of 17 lymph nodes were positive for metastatic adenocarcinoma. Hospital course was complicated by pneumonia and abdominal abscesses. Body fluid culture on 10/20/2013 showed Candida tropicalis. She was initially treated with micafungin. She was discharged home on oral fluconazole for 4 weeks.  She reports improvement in her appetite/oral intake. Pain is "okay". She is currently on OxyContin 10 mg every 12 hours. She is requiring no breakthrough pain medication. She denies nausea/vomiting. No mouth sores. Bowels moving regularly. She denies fever. Abdominal wound is healing. She is performing once daily dressing changes.  Objective:  Vital signs in last 24 hours:  Blood pressure 114/60, pulse 89, temperature 98 F (36.7 C), temperature source Oral, resp. rate 18, height 5\' 6"  (1.676 m), weight 151 lb 12.8 oz (68.856 kg), last menstrual period 10/11/2013, SpO2 100.00%.    HEENT: No thrush or ulcerations. Resp: Lungs clear bilaterally. Cardio: Regular rate and rhythm. GI: Abdomen soft and nontender. No hepatomegaly. Mid to lower portion of the midline wound is open and packed. The wound edges appear clean. Vascular: No leg edema.   Lab Results:  Lab Results  Component Value Date   WBC 6.8 10/26/2013   HGB 7.7* 10/26/2013   HCT 25.7* 10/26/2013   MCV 77.9* 10/26/2013   PLT 611* 10/26/2013   NEUTROABS 3.2 10/27/2013    Imaging:  No results found.  Medications: I have reviewed the  patient's current medications.  Assessment/Plan: 1. Gastric cancer status post upper endoscopy 09/22/2013 with findings of a partially obstructing oozing cratered gastric ulcer in the gastric antrum.  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  CT scans chest/abdomen/pelvis 10/03/2013 showed a 7 x 5 mm groundglass opacity in the superior segment of the right lower lobe; a 4.5 mm nodular density in the right upper lobe; bilateral axillary adenopathy; low density area anteriorly in the left hepatic lobe most consistent with fatty infiltration; another hypodense area within the medial segment of the left hepatic lobe; severe gastric distention; mild bilateral inguinal adenopathy. Subtotal gastrectomy and lymph node dissection with creation of a gastrojejunostomy 10/10/2013. No evidence of distant metastatic disease noted at the time of surgery. No evidence of liver metastases. 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 Tumor involved the serosal surface. Resection margins were negative. 10 of 17 lymph nodes were positive for metastatic adenocarcinoma. 2. Gastric outlet obstruction secondary to #1. Resolved. 3. Nausea/vomiting secondary to #2. Resolved. 4. Abdominal pain secondary to #1 and #2. Improved. 5. Weight loss secondary to #1 and #2. 6. Microcytic anemia, likely iron deficiency. She received iron dextran 10/11/2013. 7. Lupus/Sjgren's. 8. Pneumonia during hospitalization July 2015.  9. Abdominal abscesses with body fluid culture 10/20/2013 showing moderate Candida tropicalis. She was discharged home on a 4 week course of fluconazole.    Disposition: Ms. Shampine continues to recover from surgery. Dr. Benay Spice reviewed the surgical pathology with Ms. Warburton and her mother. She understands the high chance of developing recurrent gastric cancer. Dr. Benay Spice recommends adjuvant chemotherapy and radiation. She will receive  adjuvant weekly 5-FU/leucovorin according to the "[place]" regimen for  2 months to be followed by concurrent capecitabine and radiation. We again reviewed potential toxicities associated with the chemotherapy.   We will refer her for a staging PET scan prior to the initiation of chemotherapy.  The abdominal wound is healing. She has a followup visit with Dr. Excell Seltzer on 11/12/2013.   She is currently taking OxyContin 10 mg every 12 hours and requiring no breakthrough pain medication. She will begin to wean off of the OxyContin.  She will return to begin cycle one/week 1 5-fluorouracil on 11/14/2013. We will see her in followup prior to treatment that day.  Patient seen with Dr. Benay Spice. 35 minutes were spent face-to-face at today's visit with the majority of that time involved in counseling/coordination of care.   Ned Card ANP/GNP-BC   11/03/2013  4:55 PM  This was a shared visit with Ned Card. Ms. Kulkarni was interviewed and examined.  She appears to be recovering well from the gastrectomy procedure. The plan is to proceed with adjuvant 5-fluorouracil/leucovorin chemotherapy to begin 11/14/2013. I reviewed the potential toxicities associated with this regimen again today. She agrees to proceed. She will then be referred for adjuvant radiation and concurrent capecitabine. She is scheduled to see Dr. Lisbeth Renshaw later this week. She will see Dr. Excell Seltzer  this week to be sure he is okay with beginning chemotherapy. She plans to schedule a cervical Leep procedure for later this week.  Julieanne Manson, M.D.

## 2013-11-03 NOTE — Telephone Encounter (Signed)
Per staff message and POF I have scheduled appts. Advised scheduler of appts. JMW  

## 2013-11-05 ENCOUNTER — Ambulatory Visit
Admission: RE | Admit: 2013-11-05 | Discharge: 2013-11-05 | Disposition: A | Discharge status: Home / Self Care | Payer: 59 | Source: Ambulatory Visit | Attending: Radiation Oncology | Admitting: Radiation Oncology

## 2013-11-05 ENCOUNTER — Telehealth: Payer: Self-pay

## 2013-11-05 ENCOUNTER — Encounter: Payer: Self-pay | Admitting: Radiation Oncology

## 2013-11-05 VITALS — BP 106/59 | HR 82 | Temp 98.3°F | Resp 16 | Ht 66.0 in | Wt 152.2 lb

## 2013-11-05 DIAGNOSIS — M329 Systemic lupus erythematosus, unspecified: Secondary | ICD-10-CM | POA: Diagnosis not present

## 2013-11-05 DIAGNOSIS — C162 Malignant neoplasm of body of stomach: Secondary | ICD-10-CM

## 2013-11-05 DIAGNOSIS — Z510 Encounter for antineoplastic radiation therapy: Secondary | ICD-10-CM | POA: Diagnosis not present

## 2013-11-05 DIAGNOSIS — Z79899 Other long term (current) drug therapy: Secondary | ICD-10-CM | POA: Insufficient documentation

## 2013-11-05 DIAGNOSIS — IMO0002 Reserved for concepts with insufficient information to code with codable children: Secondary | ICD-10-CM | POA: Insufficient documentation

## 2013-11-05 DIAGNOSIS — C163 Malignant neoplasm of pyloric antrum: Secondary | ICD-10-CM

## 2013-11-05 DIAGNOSIS — Z934 Other artificial openings of gastrointestinal tract status: Secondary | ICD-10-CM | POA: Diagnosis not present

## 2013-11-05 DIAGNOSIS — Z903 Acquired absence of stomach [part of]: Secondary | ICD-10-CM | POA: Diagnosis not present

## 2013-11-05 DIAGNOSIS — C169 Malignant neoplasm of stomach, unspecified: Secondary | ICD-10-CM

## 2013-11-05 HISTORY — DX: Allergy, unspecified, initial encounter: T78.40XA

## 2013-11-05 HISTORY — DX: Tachycardia, unspecified: R00.0

## 2013-11-05 HISTORY — DX: Pneumonia, unspecified organism: J18.9

## 2013-11-05 HISTORY — DX: Anemia, unspecified: D64.9

## 2013-11-05 HISTORY — DX: Other specified symptoms and signs involving the digestive system and abdomen: R19.8

## 2013-11-05 HISTORY — DX: Unspecified severe protein-calorie malnutrition: E43

## 2013-11-05 NOTE — Progress Notes (Signed)
Please see the Nurse Progress Note in the MD Initial Consult Encounter for this patient. 

## 2013-11-05 NOTE — Telephone Encounter (Signed)
Message copied by Tyler Aas A on Wed Nov 05, 2013  9:50 AM ------      Message from: [place], Andrea Best      Created: Wed Nov 05, 2013  9:10 AM       Please find out if she was able to schedule LEEP procedure. Also need name of GYN doing procedure. Thanks.  ------

## 2013-11-05 NOTE — Telephone Encounter (Signed)
Called patient, states she has an appt scheduled Friday morning with [facility] with Dr. Simona Huh. Informed Ned Card, NP of patient appt.

## 2013-11-07 ENCOUNTER — Other Ambulatory Visit: Payer: Self-pay | Admitting: Obstetrics and Gynecology

## 2013-11-07 DIAGNOSIS — C163 Malignant neoplasm of pyloric antrum: Secondary | ICD-10-CM | POA: Insufficient documentation

## 2013-11-07 NOTE — Progress Notes (Signed)
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   HISTORY OF PRESENT ILLNESS::Andrea Best is a 43 y.o. female who is seen for an initial consultation visit. The patient indicates that she experienced a several month history of nausea and vomiting in addition to acid reflux. She has lost approximately 30 pounds over this time frame and also felt fatigued. Additionally the patient has had some constipation and dark stools. She proceeded to undergo upper endoscopy on 09/22/2013. A partially obstructing gastric ulcer was seen within the gastric antrum. 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  CT scans were obtained of the abdomen and pelvis in addition to the chest. A distended stomach was noted. Low density areas were noted within the liver as well as a groundglass opacity seen in the right lower lobe and a 5 mm nodular density within the right upper lobe. Bilateral axillary adenopathy was present.  The patient proceeded to undergo a subtotal gastrectomy and lymph node dissection. This resulted in a gastrojejunostomy. There was no evidence of distant metastatic disease noted at the time of surgery. No evidence of liver metastases which was evaluated. The pathology report revealed an invasive moderately to poorly differentiated adenocarcinoma spanning 5 cm. Tumor invaded through the muscularis propria to involve the serosal surface. The proximal and distal margins were negative. A total of 17 lymph nodes were evaluated with 10 demonstrating carcinoma. Pathologically this represented a [id number]  tumor (stage IIIc).  The patient states that she has been recovering reasonably well from her surgery. She does have an open abdominal incision which continues to slowly heal. She has been seen by medical oncology and I have been asked to see her regarding possible adjuvant radiation treatment.   PREVIOUS RADIATION THERAPY: No   PAST MEDICAL HISTORY:  has a past medical history of Asthma; Eczema; Lupus; Sjogren's disease; Allergy; Anemia; Tachycardia; Pneumonia; Protein-calorie malnutrition, severe; Abnormal findings on esophagogastroduodenoscopy (EGD) (09/22/13); and Gastric cancer (09/22/13 &10/10/13).     PAST SURGICAL HISTORY: Past Surgical History  Procedure Laterality Date  . Cesarean section      1997  . Laparotomy N/A 10/10/2013    Procedure: SUBTOTAL GASTRECTOMY WITH GASTRIC JEJUNOSOTMY;  Surgeon: Edward Jolly, MD;  Location: [facility];  Service: General;  Laterality: N/A;     FAMILY HISTORY: family history includes Diabetes in her maternal grandfather, maternal grandmother, and mother; Hypertension in her mother.   SOCIAL HISTORY:  reports that she has never smoked. She does not have any smokeless tobacco history on file. She reports that she drinks alcohol. She reports that she does not use illicit drugs.   ALLERGIES: Azithromycin; Sulfa antibiotics; Hydrocodone-acetaminophen; and Other   MEDICATIONS:  Current Outpatient Prescriptions  Medication Sig Dispense Refill  . fluconazole (DIFLUCAN) 100 MG tablet Take 1 tablet (100 mg total) by mouth daily.  28 tablet  0  . hydroxychloroquine (PLAQUENIL) 200 MG tablet Take 200 mg by mouth 2 (two) times daily.      Marland Kitchen lactose free nutrition (BOOST PLUS) LIQD Take 237 mLs by mouth 2 (two) times daily between meals.  60 Can  0  .  lansoprazole (PREVACID SOLUTAB) 15 MG disintegrating tablet Take 15 mg by mouth 2 (two) times daily before a meal.      . Multiple Vitamin (MULTIVITAMIN WITH MINERALS) TABS tablet Take 1 tablet by mouth  daily.  30 tablet  0  . OxyCODONE (OXYCONTIN) 10 mg T12A 12 hr tablet Take 1 tablet (10 mg total) by mouth every 12 (twelve) hours.  20 tablet  0  . polyethylene glycol (MIRALAX / GLYCOLAX) packet Take 17 g by mouth as needed.      . predniSONE (DELTASONE) 5 MG tablet Take 5 mg by mouth 2 (two) times daily with a meal.      . oxyCODONE (OXY IR/ROXICODONE) 5 MG immediate release tablet Take 1 tablet (5 mg total) by mouth every 4 (four) hours as needed for moderate pain, severe pain or breakthrough pain.  30 tablet  0   No current facility-administered medications for this encounter.     REVIEW OF SYSTEMS:  A 15 point review of systems is documented in the electronic medical record. This was obtained by the nursing staff. However, I reviewed this with the patient to discuss relevant findings and make appropriate changes.  Pertinent items are noted in HPI.    PHYSICAL EXAM:  height is 5\' 6"  (1.676 m) and weight is 152 lb 3.2 oz (69.037 kg). Her oral temperature is 98.3 F (36.8 C). Her blood pressure is 106/59 and her pulse is 82. Her respiration is 16.   ECOG = 1  0 - Asymptomatic (Fully active, able to carry on all predisease activities without restriction)  1 - Symptomatic but completely ambulatory (Restricted in physically strenuous activity but ambulatory and able to carry out work of a light or sedentary nature. For example, light housework, office work)  2 - Symptomatic, <50% in bed during the day (Ambulatory and capable of all self care but unable to carry out any work activities. Up and about more than 50% of waking hours)  3 - Symptomatic, >50% in bed, but not bedbound (Capable of only limited self-care, confined to bed or chair 50% or more of waking hours)  4 - Bedbound (Completely disabled. Cannot carry on any self-care. Totally confined to bed or chair)  5 - Death   Eustace Pen MM, Creech RH, Tormey DC, et al. [phone]). "Toxicity and response criteria of the [facility]". [place] Oncol. 5 (6): 649-55  General: Well-developed, in no acute distress HEENT: Normocephalic, atraumatic; oral cavity clear Neck: Supple without any lymphadenopathy Cardiovascular: Regular rate and rhythm Respiratory: Clear to auscultation bilaterally GI: Soft, nontender, normal bowel sounds, patient has an open abdominal incision which is packed and bandage today. Extremities: No edema present Neuro: No focal deficits     LABORATORY DATA:  Lab Results  Component Value Date   WBC 6.8 10/26/2013   HGB 7.7* 10/26/2013   HCT 25.7* 10/26/2013   MCV 77.9* 10/26/2013   PLT 611* 10/26/2013   Lab Results  Component Value Date   NA 133* 10/27/2013   K 4.1 10/27/2013   CL 96 10/27/2013   CO2 23 10/27/2013   Lab Results  Component Value Date   ALT 145* 10/27/2013   AST 89* 10/27/2013   ALKPHOS 405* 10/27/2013   BILITOT 0.3 10/27/2013      RADIOGRAPHY: Dg Chest 1 View  10/21/2013   CLINICAL DATA:  LEFT thoracentesis.  EXAM: CHEST - 1 VIEW  COMPARISON:  10/20/2013.  FINDINGS: There is no pneumothorax status post LEFT  thoracentesis. LEFT pleural effusion and atelectasis remains present. RIGHT basilar atelectasis. RIGHT upper extremity PICC tip terminates at the junction of the superior vena cava and RIGHT atrium. Surgical clips are present in the upper abdomen. Cardiopericardial silhouette is within normal limits.  IMPRESSION: No pneumothorax status post LEFT thoracentesis. LEFT-greater-than-RIGHT bilateral basilar atelectasis and small LEFT pleural effusion.   Electronically Signed   By: Dereck Ligas M.D.   On: 10/21/2013 16:06   Dg Chest 2 View  10/14/2013   CLINICAL DATA:  Chest pain. Worsening pneumonia. Fever. Shortness of breath.  EXAM: CHEST  2 VIEW  COMPARISON:  Chest x-ray 10/12/2013.  FINDINGS: There is a right upper extremity PICC with tip terminating in the superior aspect of the right atrium. Nasogastric tube in place extending into the mid stomach. Lung volumes are low. Worsening  opacification at the base of the left hemithorax, compatible with atelectasis and/or consolidation in the left lower lobe, was superimposed moderate left pleural effusion. Right lung appears clear. No evidence of pulmonary edema. Heart size is within normal limits. Upper mediastinal contours are unremarkable. Numerous surgical clips and skin staples are seen overlying the epigastric region.  IMPRESSION: 1. Worsening atelectasis and/or consolidation in the left lower lobe with increasing moderate left pleural effusion. 2. Support apparatus, as above.   Electronically Signed   By: Vinnie Langton M.D.   On: 10/14/2013 14:14   Ct Angio Chest Pe W/cm &/or Wo Cm  10/20/2013   CLINICAL DATA:  History of malignant gastric tumor removal now with mid abdominal pain and tachycardia ; history of lupus and Sjogren's disease  EXAM: CT ANGIOGRAPHY CHEST  CT ABDOMEN AND PELVIS WITH CONTRAST  TECHNIQUE: Multidetector CT imaging of the chest was performed using the standard protocol during bolus administration of intravenous contrast. Multiplanar CT image reconstructions and MIPs were obtained to evaluate the vascular anatomy. Multidetector CT imaging of the abdomen and pelvis was performed using the standard protocol during bolus administration of intravenous contrast.  CONTRAST:  113mL OMNIPAQUE IOHEXOL 350 MG/ML SOLN, 70mL OMNIPAQUE IOHEXOL 300 MG/ML SOLN  COMPARISON:  Portable chest x-ray of today's date CT scan of the chest and abdomen and pelvis of October 03, 2013  FINDINGS: CTA CHEST FINDINGS  Contrast within the pulmonary arterial tree is normal. The caliber of the thoracic aorta is normal. There is no false lumen. There is a new moderate size left pleural effusion layering posteriorly. There is atelectasis of much of the left lower lobe and atelectasis of the posterior inferior aspect of the right lower lobe. The cardiac chambers are top-normal in size. There is no pericardial effusion. There is a precarinal lymph node  which measures 9 mm in short axis. There is no sub carinal, AP window, or axillary lymphadenopathy. There is a small hiatal hernia.  The observed portions of the bony thorax are normal.  CT ABDOMEN and PELVIS FINDINGS  The patient has undergone partial resection of the left hepatic lobe. There is a small amount of fluid at the surgical site. There is a drainage catheter in place which enters the abdominal cavity tip to the right of midline. The tip comes to lie superior to the spleen adjacent to the medial aspect of the remaining portion of the stomach. There are a few normal sized perigastric lymph nodes. There is no bulky lymphadenopathy.  There is minimal intrahepatic ductal dilation. The gallbladder is normal. The pancreas exhibits no inflammatory change or focal mass. There is a thin walled, elliptical fluid collection inferior  to the tip of the spleen which measures 8.3 cm AP x 2.5 cm transversely x 4.2 cm longitudinally with HU value of +14. The kidneys and adrenal glands are normal. The periaortic and pericaval regions are normal.  Contrast has traversed the stomach and reached as far distally as the proximal transverse colon. There is no ileus, obstruction, or acute inflammation. There is small amount of free pelvic fluid. The uterus exhibits heterogeneous density and there is likely a fibroid in the fundus measuring 3.2 cm in diameter. There is a tiny amount of gas within the partially distended urinary bladder. There is no inguinal hernia. There is a small amount of fluid within the midline incision.  The lumbar spine and bony pelvis are unremarkable.  Review of the MIP images confirms the above findings.  IMPRESSION: 1. There is no acute pulmonary embolism nor acute thoracic aortic pathology. 2. Left lower lobe atelectasis and/or pneumonia with moderate-sized left pleural effusion new since July 10th. There mild posterior right lower lobe atelectasis. 3. There are surgical changes from the previous  partial left gastrectomy. A drainage catheter is in place in the gastric band and no abnormal fluid collection is seen here. There is a fluid collection inferior to the spleen compatible with a seroma or resolved hematoma. There is a small amount of free pelvic fluid. 4. Postsurgical changes in the left hepatic lobe are noted. A small amount of fluid is present at the surgical site but no abscess is demonstrated. 5. There is no acute bowel abnormality nor acute hepatobiliary abnormality. There is a small amount of fluid in the midline incision.   Electronically Signed   By: David  Martinique   On: 10/20/2013 13:15   Ct Abdomen Pelvis W Contrast  10/20/2013   CLINICAL DATA:  Status post subtotal gastrectomy.  EXAM: CT CHEST, ABDOMEN, AND PELVIS WITH CONTRAST  TECHNIQUE: Multidetector CT imaging of the chest, abdomen and pelvis was performed following the standard protocol during bolus administration of intravenous contrast.  CONTRAST:  126mL OMNIPAQUE IOHEXOL 350 MG/ML SOLN, 73mL OMNIPAQUE IOHEXOL 300 MG/ML SOLN  COMPARISON:  CT scan of October 03, 2013.  FINDINGS: CT CHEST FINDINGS  No pneumothorax is noted. Large left pleural effusion is noted with atelectasis of the left lower lobe. Mild right posterior basilar subsegmental atelectasis is noted. Right-sided PICC line is noted with distal tip at the cavoatrial junction. There is no evidence of pulmonary embolus. Thoracic aorta and great vessels appear normal without evidence of dissection or aneurysm. Bilateral axillary adenopathy is noted which is unchanged compared to prior exam, with the largest measuring 21 x 11 mm on the right side. Precarinal lymph node measuring 12 x 9 mm is noted which most likely is benign.  CT ABDOMEN AND PELVIS FINDINGS  No gallstones are noted. Patient is status post partial gastrectomy with Roux en Y procedure. The spleen and pancreas appear normal. The low density seen in the left hepatic lobe on prior exam is significantly improved  compared to prior exam and is most consistent with focal fatty infiltration as opposed to neoplasm. No other abnormality seen in hepatic parenchyma. Adrenal glands and kidneys appear normal. No hydronephrosis or renal obstruction is in noted. There is noted a lenticular shaped fluid collection measuring 8.5 x 2.8 cm inferior to the spleen. Surgical drain remains within the epigastric region. Expected amount of postoperative fluid is seen in the epigastric region anteriorly. Minimal amount of fluid is noted in the right pericolic gutter. There is no evidence  of bowel obstruction. Urinary bladder appears normal. Continued presence of probable fibroid is noted and uterine fundus. Interval development of fluid collection with enhancing margins is noted in the presacral space of the pelvis measuring 3.9 x 3.1 cm. Anterior midline surgical incision is noted with a small amount of associated fluid.  IMPRESSION: No evidence of pulmonary embolus.  Large left pleural effusion is noted with associated atelectasis of the left lower lobe.  Stable bilateral axillary and inguinal adenopathy is noted which which may be reactive, but metastatic disease cannot be excluded. PET scan may be performed further evaluation.  Status post partial gastrectomy with expected postoperative changes. Surgical drain remains in the epigastric region.  Abnormality noted in left hepatic lobe on prior exam is significantly improved to most likely represents focal fatty infiltration.  Stable uterine fibroid.  There is noted 8.5 x 2.8 cm lenticular shaped fluid collection inferior to the spleen concerning for possible abscess or seroma. Also noted is 3.9 x 3.1 cm fluid collection seen in the presacral space of the posterior pelvis which may represent abscess is well. Critical Value/emergent results were called by telephone at the time of interpretation on 10/20/2013 at 1:37 pm to [facility] , who verbally acknowledged these results.   Electronically  Signed   By: Sabino Dick M.D.   On: 10/20/2013 13:39   Ct Aspiration  10/21/2013   CLINICAL DATA:  Abdominal and pelvic abscesses  EXAM: CT GUIDED ASPIRATION BIOPSY OF ABDOMINAL AND PELVIC ABSCESSES  ANESTHESIA/SEDATION: Four  Mg IV Versed; 100 mcg IV Fentanyl  Total Moderate Sedation Time: 30 minutes.  PROCEDURE: The procedure risks, benefits, and alternatives were explained to the patient. Questions regarding the procedure were encouraged and answered. The patient understands and consents to the procedure.  The abdomen was prepped with Betadinein a sterile fashion, and a sterile drape was applied covering the operative field. A sterile gown and sterile gloves were used for the procedure. Local anesthesia was provided with 1% Lidocaine.  Under CT guidance, an 18 gauge needle was advanced into the perisplenic fluid collection. 15 cc serous fluid was aspirated and sent for culture.  In the prone position, the right gluteal region was prepped and draped in a sterile fashion. Under CT guidance, a second 18 gauge needle was advanced into the pelvic fluid collection via right trans gluteal approach. 12 cc cloudy reddish fluid was aspirated. Samples were sent for culture.  Imaging with documentation was performed.  Complications: None.  FINDINGS: The series of images demonstrate needle placement into the perisplenic and pelvic fluid collections. Post aspiration imaging demonstrates near resolution of the fluid collections.  IMPRESSION: Successful aspiration of 2 fluid collections as described above. The perisplenic aspiration yielded serous fluid in the pelvic aspiration yielded purulent bloody fluid.   Electronically Signed   By: Maryclare Bean M.D.   On: 10/21/2013 12:26   Ct Aspiration  10/21/2013   CLINICAL DATA:  Abdominal and pelvic abscesses  EXAM: CT GUIDED ASPIRATION BIOPSY OF ABDOMINAL AND PELVIC ABSCESSES  ANESTHESIA/SEDATION: Four  Mg IV Versed; 100 mcg IV Fentanyl  Total Moderate Sedation Time: 30 minutes.   PROCEDURE: The procedure risks, benefits, and alternatives were explained to the patient. Questions regarding the procedure were encouraged and answered. The patient understands and consents to the procedure.  The abdomen was prepped with Betadinein a sterile fashion, and a sterile drape was applied covering the operative field. A sterile gown and sterile gloves were used for the procedure. Local anesthesia was provided  with 1% Lidocaine.  Under CT guidance, an 18 gauge needle was advanced into the perisplenic fluid collection. 15 cc serous fluid was aspirated and sent for culture.  In the prone position, the right gluteal region was prepped and draped in a sterile fashion. Under CT guidance, a second 18 gauge needle was advanced into the pelvic fluid collection via right trans gluteal approach. 12 cc cloudy reddish fluid was aspirated. Samples were sent for culture.  Imaging with documentation was performed.  Complications: None.  FINDINGS: The series of images demonstrate needle placement into the perisplenic and pelvic fluid collections. Post aspiration imaging demonstrates near resolution of the fluid collections.  IMPRESSION: Successful aspiration of 2 fluid collections as described above. The perisplenic aspiration yielded serous fluid in the pelvic aspiration yielded purulent bloody fluid.   Electronically Signed   By: Maryclare Bean M.D.   On: 10/21/2013 12:26   Dg Chest Port 1 View  10/18/2013   CLINICAL DATA:  Chest pain  EXAM: PORTABLE CHEST - 1 VIEW  COMPARISON:  October 14, 2013  FINDINGS: Nasogastric tube is been removed. Central catheter tip is at the cavoatrial junction. No pneumothorax. There is consolidation in the left base with small left effusion. There is atelectatic change in the right base. Heart is mildly enlarged with normal pulmonary vascularity. No adenopathy.  IMPRESSION: Airspace consolidation left lower lobe with small left effusion. Right base atelectasis. Central catheter tip at  cavoatrial junction. No pneumothorax. No change in cardiac silhouette.   Electronically Signed   By: Lowella Grip M.D.   On: 10/18/2013 14:16   Dg Chest Port 1 View  10/12/2013   CLINICAL DATA:  Fever and cough.  Asthma.  EXAM: PORTABLE CHEST - 1 VIEW  COMPARISON:  10/08/2013  FINDINGS: Nasogastric tube is seen with tip in the mid stomach. A right arm PICC line is seen with tip overlying the proximal to mid right atrium. Low lung volumes are seen with bibasilar opacity which may be due to atelectasis or pneumonia. No evidence of pleural effusion.  IMPRESSION: Low lung volumes with bibasilar atelectasis or pneumonia.   Electronically Signed   By: Earle Gell M.D.   On: 10/12/2013 09:30   Dg Chest Port 1 View  10/08/2013   CLINICAL DATA:  Dry cough.  Evaluate for infiltrate  EXAM: PORTABLE CHEST - 1 VIEW  COMPARISON:  06/30/2007  FINDINGS: Normal heart size and mediastinal contours. No consolidation or edema. Nodular densities seen on preceding chest CT are not clearly visible. No effusion or pneumothorax. No acute osseous findings.  IMPRESSION: No active disease.   Electronically Signed   By: Jorje Guild M.D.   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 Postop day 5. Abdomen pain, leukocytosis, query anastomotic leak. Initial encounter.  EXAM: WATER SOLUBLE UPPER GI SERIES  TECHNIQUE: Single-column upper GI series was performed using water soluble contrast.  CONTRAST:  67mL OMNIPAQUE IOHEXOL 300 MG/ML  SOLN  COMPARISON:  Preoperative CT Abdomen and Pelvis 10/03/2013.  FLUOROSCOPY TIME:  1 min and 16 seconds.  FINDINGS: Preprocedural scout view of the abdomen. NG tube in place in the left upper quadrant. Right abdominal approach postoperative drain. Numerous surgical clips to the right of the spine. Multiple staple lines in the left upper quadrant. Midline abdominal staples.  Paucity of bowel gas. Non obstructed bowel gas pattern.  50 mL of  Omnipaque 300 was injected into the patient NG tube and followed by 50 mL of water. Filling of the proximal stomach remnant occurred as expected. A small volume of gastroesophageal reflux occurred tracking proximally along the course of the tube. The patient was mildly symptomatic to this.  After a short delay of to min there is faint contrast visible in the jejunostomy loop (series 9-11 arrows). After an additional 15 min delay, more robust opacification of the jejunostomy loop is noted (image 16). No abnormal accumulation of contrast identified. No contrast within the nearby surgical drain.  IMPRESSION: 1. No evidence of anastomotic leak. Slow transit of contrast from the stomach to the jejunostomy loop. 2. Small volume gastroesophageal reflux to which the patient was mildly symptomatic.   Electronically Signed   By: Lars Pinks M.D.   On: 10/15/2013 12:34   US Thoracentesis Asp Pleural Space W/img Guide  10/21/2013   CLINICAL DATA:  Left-sided pleural effusion. Request diagnostic and therapeutic thoracentesis.  EXAM: ULTRASOUND GUIDED LEFT THORACENTESIS  COMPARISON:  None.  PROCEDURE: An ultrasound guided thoracentesis was thoroughly discussed with the patient and questions answered. The benefits, risks, alternatives and complications were also discussed. The patient understands and wishes to proceed with the procedure. Written consent was obtained.  Ultrasound was performed to localize and mark an adequate pocket of fluid in the left chest. The area was then prepped and draped in the normal sterile fashion. 1% Lidocaine was used for local anesthesia. Under ultrasound guidance a 19 gauge Yueh catheter was introduced. Thoracentesis was performed. The catheter was removed and a dressing applied.  Complications:  None immediate  FINDINGS: A total of approximately 270 mL of hazy, dark yellow fluid was removed. A fluid sample wassent for laboratory  analysis.  IMPRESSION: Successful ultrasound guided left thoracentesis yielding 270 mL of pleural fluid.  Read by: Ascencion Dike PA-C   Electronically Signed   By: Maryclare Bean M.D.   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 She continues to recover and notably she does have an open abdominal incision which remains at this time which continues to heal.  I believe that the patient is an appropriate candidate for postoperative chemoradiation treatment. The patient has been seen by Dr. Benay Spice and medical oncology and she is proceeding with chemotherapy initially which will be followed by chemoradiation treatment. The patient's case was discussed in multidisciplinary GI conference. I discussed with the patient the rationale for radiation treatment. We discussed the logistics of treatment and I would anticipate a 5-1/2 week course of radiation. We also discussed the possible side effects and risks of treatment. All of her questions were answered.   PLAN: The patient will proceed with chemotherapy at this time. This will allow further healing of the abdominal incision. I will see the patient in approximately 2 months to evaluate her at that time and to further discuss and coordinate a course of radiation treatment       ________________________________   Jodelle Gross, MD, PhD   **Disclaimer: This note was dictated with voice recognition software. Similar sounding words can inadvertently be transcribed and this note may contain transcription errors which may not have been corrected upon publication of note.**

## 2013-11-09 ENCOUNTER — Other Ambulatory Visit: Payer: Self-pay | Admitting: Oncology

## 2013-11-10 ENCOUNTER — Telehealth (INDEPENDENT_AMBULATORY_CARE_PROVIDER_SITE_OTHER): Payer: Self-pay

## 2013-11-10 ENCOUNTER — Other Ambulatory Visit: Payer: Self-pay | Admitting: *Deleted

## 2013-11-10 NOTE — Telephone Encounter (Signed)
Andrea Best [facility] called to let Dr Excell Seltzer that wound looks good except mod cloudy yellowish drainage. Pt has not had any fevers, chills, incision is not red or swollen. Pt has a f/u appt with Dr Excell Seltzer on 11/12/13. Informed Andrea Best that I would send Dr Excell Seltzer a message making him aware of the wound.

## 2013-11-11 ENCOUNTER — Encounter (HOSPITAL_COMMUNITY)
Admission: RE | Admit: 2013-11-11 | Discharge: 2013-11-11 | Disposition: A | Discharge status: Home / Self Care | Payer: 59 | Source: Ambulatory Visit | Attending: Diagnostic Radiology | Admitting: Diagnostic Radiology

## 2013-11-11 ENCOUNTER — Encounter (HOSPITAL_COMMUNITY): Payer: Self-pay

## 2013-11-11 ENCOUNTER — Telehealth: Payer: Self-pay | Admitting: Oncology

## 2013-11-11 ENCOUNTER — Telehealth: Payer: Self-pay | Admitting: *Deleted

## 2013-11-11 ENCOUNTER — Other Ambulatory Visit: Payer: 59

## 2013-11-11 ENCOUNTER — Encounter: Payer: Self-pay | Admitting: *Deleted

## 2013-11-11 ENCOUNTER — Ambulatory Visit (HOSPITAL_COMMUNITY): Payer: 59

## 2013-11-11 DIAGNOSIS — R234 Changes in skin texture: Secondary | ICD-10-CM | POA: Insufficient documentation

## 2013-11-11 DIAGNOSIS — C169 Malignant neoplasm of stomach, unspecified: Secondary | ICD-10-CM | POA: Diagnosis not present

## 2013-11-11 LAB — GLUCOSE, CAPILLARY: Glucose-Capillary: 86 mg/dL (ref 70–99)

## 2013-11-11 MED ORDER — FLUDEOXYGLUCOSE F - 18 (FDG) INJECTION
7.5300 | Freq: Once | INTRAVENOUS | Status: AC | PRN
  Administered 2013-11-11: 7.53 via INTRAVENOUS

## 2013-11-11 NOTE — Telephone Encounter (Signed)
Lft msg for pt chemo edu class per 08/17 POF...Marland KitchenMarland KitchenKJ advised [facility] edit notes for pt to pick up updated sch and advise of chemo class same day.Marland Kitchen..KJ

## 2013-11-11 NOTE — Telephone Encounter (Signed)
Per staff message and POF I have scheduled appts. Advised scheduler of appts. JMW  

## 2013-11-12 ENCOUNTER — Encounter (INDEPENDENT_AMBULATORY_CARE_PROVIDER_SITE_OTHER): Payer: Self-pay | Admitting: General Surgery

## 2013-11-12 ENCOUNTER — Ambulatory Visit (INDEPENDENT_AMBULATORY_CARE_PROVIDER_SITE_OTHER): Payer: 59 | Admitting: General Surgery

## 2013-11-12 VITALS — BP 126/74 | HR 78 | Temp 98.0°F | Ht 66.0 in | Wt 150.0 lb

## 2013-11-12 DIAGNOSIS — C169 Malignant neoplasm of stomach, unspecified: Secondary | ICD-10-CM

## 2013-11-12 NOTE — Progress Notes (Signed)
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 She had some degree of wound infection in the hospital had a portion of her wound open. This has been doing well per patient. She states she is eating without pain or nausea and her appetite is returning. She has been evaluated by medical oncology and will be starting chemotherapy. She had a PET scan yesterday.  Exam: BP 126/74  Pulse 78  Temp(Src) 98 F (36.7 C)  Ht 5\' 6"  (1.676 m)  Wt 150 lb (68.04 kg)  BMI 24.22 kg/m2  LMP 09/11/2013 General: Appears well Abdomen: Soft and nontender. There is about a 3 cm open area in the lower incision that is Completely clean granulation tissue. There is a small thin fluctuant area in the upper incision which I opened with an 18-gauge needle and drained a few cc of pus. This was packed with gauze as was the lower incision.  Last hemoglobin was 7.7. She is on iron. PET scan was reviewed which shows some increased activity in the axillary lymph nodes right greater than left which I suspect is secondary to her lupus. There is marked increase activity in the gastric bed and then the incision was certainly could be postoperative change. No other evidence of distant metastasis  Assessment and plan: Overall doing well following surgery. Okay to begin chemotherapy. Continue current wound care. Return in 6 weeks.

## 2013-11-14 ENCOUNTER — Ambulatory Visit (HOSPITAL_BASED_OUTPATIENT_CLINIC_OR_DEPARTMENT_OTHER): Payer: 59 | Admitting: Oncology

## 2013-11-14 ENCOUNTER — Other Ambulatory Visit (HOSPITAL_BASED_OUTPATIENT_CLINIC_OR_DEPARTMENT_OTHER): Payer: 59

## 2013-11-14 ENCOUNTER — Ambulatory Visit (HOSPITAL_BASED_OUTPATIENT_CLINIC_OR_DEPARTMENT_OTHER): Payer: 59

## 2013-11-14 ENCOUNTER — Encounter: Payer: Self-pay | Admitting: Oncology

## 2013-11-14 VITALS — BP 119/65 | HR 96 | Temp 98.4°F | Resp 18 | Ht 66.0 in | Wt 151.5 lb

## 2013-11-14 DIAGNOSIS — C169 Malignant neoplasm of stomach, unspecified: Secondary | ICD-10-CM

## 2013-11-14 DIAGNOSIS — G893 Neoplasm related pain (acute) (chronic): Secondary | ICD-10-CM

## 2013-11-14 DIAGNOSIS — Z5111 Encounter for antineoplastic chemotherapy: Secondary | ICD-10-CM

## 2013-11-14 DIAGNOSIS — D509 Iron deficiency anemia, unspecified: Secondary | ICD-10-CM

## 2013-11-14 DIAGNOSIS — C163 Malignant neoplasm of pyloric antrum: Secondary | ICD-10-CM

## 2013-11-14 LAB — COMPREHENSIVE METABOLIC PANEL (CC13)
ALT: 12 U/L (ref 0–55)
AST: 15 U/L (ref 5–34)
Albumin: 2.9 g/dL — ABNORMAL LOW (ref 3.5–5.0)
Alkaline Phosphatase: 100 U/L (ref 40–150)
Anion Gap: 8 mEq/L (ref 3–11)
BUN: 7.4 mg/dL (ref 7.0–26.0)
CO2: 25 meq/L (ref 22–29)
Calcium: 9.2 mg/dL (ref 8.4–10.4)
Chloride: 106 mEq/L (ref 98–109)
Creatinine: 0.7 mg/dL (ref 0.6–1.1)
Glucose: 82 mg/dl (ref 70–140)
Potassium: 3.6 mEq/L (ref 3.5–5.1)
Sodium: 139 mEq/L (ref 136–145)
Total Bilirubin: 0.21 mg/dL (ref 0.20–1.20)
Total Protein: 8 g/dL (ref 6.4–8.3)

## 2013-11-14 LAB — CBC WITH DIFFERENTIAL/PLATELET
BASO%: 0.7 % (ref 0.0–2.0)
Basophils Absolute: 0 10*3/uL (ref 0.0–0.1)
EOS%: 0.7 % (ref 0.0–7.0)
Eosinophils Absolute: 0 10*3/uL (ref 0.0–0.5)
HEMATOCRIT: 33.4 % — AB (ref 34.8–46.6)
HGB: 10.3 g/dL — ABNORMAL LOW (ref 11.6–15.9)
LYMPH%: 33.5 % (ref 14.0–49.7)
MCH: 25 pg — AB (ref 25.1–34.0)
MCHC: 31 g/dL — ABNORMAL LOW (ref 31.5–36.0)
MCV: 80.5 fL (ref 79.5–101.0)
MONO#: 0.6 10*3/uL (ref 0.1–0.9)
MONO%: 14.2 % — ABNORMAL HIGH (ref 0.0–14.0)
NEUT#: 2.3 10*3/uL (ref 1.5–6.5)
NEUT%: 50.9 % (ref 38.4–76.8)
PLATELETS: 213 10*3/uL (ref 145–400)
RBC: 4.14 10*6/uL (ref 3.70–5.45)
RDW: 21.7 % — ABNORMAL HIGH (ref 11.2–14.5)
WBC: 4.5 10*3/uL (ref 3.9–10.3)
lymph#: 1.5 10*3/uL (ref 0.9–3.3)

## 2013-11-14 MED ORDER — SODIUM CHLORIDE 0.9 % IV SOLN
Freq: Once | INTRAVENOUS | Status: AC
  Administered 2013-11-14: 13:00:00 via INTRAVENOUS

## 2013-11-14 MED ORDER — PROCHLORPERAZINE MALEATE 10 MG PO TABS: 10.0000 mg | ORAL_TABLET | Freq: Four times a day (QID) | ORAL | Status: DC | PRN

## 2013-11-14 MED ORDER — LEUCOVORIN CALCIUM INJECTION 350 MG
500.0000 mg/m2 | Freq: Once | INTRAMUSCULAR | Status: AC
  Administered 2013-11-14: 896 mg via INTRAVENOUS
  Filled 2013-11-14: qty 44.8

## 2013-11-14 MED ORDER — PROCHLORPERAZINE MALEATE 10 MG PO TABS
10.0000 mg | ORAL_TABLET | Freq: Once | ORAL | Status: AC
  Administered 2013-11-14: 10 mg via ORAL

## 2013-11-14 MED ORDER — PROCHLORPERAZINE MALEATE 10 MG PO TABS
ORAL_TABLET | ORAL | Status: AC
  Filled 2013-11-14: qty 1

## 2013-11-14 MED ORDER — FLUOROURACIL CHEMO INJECTION 2.5 GM/50ML
600.0000 mg/m2 | Freq: Once | INTRAVENOUS | Status: AC
  Administered 2013-11-14: 1050 mg via INTRAVENOUS
  Filled 2013-11-14: qty 21

## 2013-11-14 NOTE — Progress Notes (Signed)
[place] OFFICE PROGRESS NOTE   Diagnosis:  Gastric cancer.  INTERVAL HISTORY:   Ms. Barreras returns for follow up to begin her chemotherapy for gastric cancer. Today is cycle 1 day 1 of 5FU and leucovorin according to the [facility] regimen. She feels well today. Abdominal wound is healing well. Has a very small amount of serous drainage on the dressing. Denies fevers. Pain is improving. No longer taking Oxycontin. Uses Oxycodone about twice a day. She denies nausea/vomiting. No mouth sores. Bowels moving regularly. She denies fever.   Objective:  Vital signs in last 24 hours:  Blood pressure 119/65, pulse 96, temperature 98.4 F (36.9 C), temperature source Oral, resp. rate 18, height 5\' 6"  (1.676 m), weight 151 lb 8 oz (68.72 kg), last menstrual period 09/11/2013, SpO2 100.00%.    HEENT: No thrush or ulcerations. Lymphatics: No cervical or supraclavicular adenopathy. Shotty bilateral lymph nodes palpated in the axillae. Resp: Lungs clear bilaterally. Cardio: Regular rate and rhythm. GI: Abdomen soft and nontender. No hepatomegaly. Mid to lower portion of the midline wound is open and packed. The wound edges appear clean. Vascular: No leg edema.   Lab Results:  Lab Results  Component Value Date   WBC 4.5 11/14/2013   HGB 10.3* 11/14/2013   HCT 33.4* 11/14/2013   MCV 80.5 11/14/2013   PLT 213 11/14/2013   NEUTROABS 2.3 11/14/2013    Imaging:  No results found.  Medications: I have reviewed the patient's current medications.  Assessment/Plan: 1. Gastric cancer status post upper endoscopy 09/22/2013 with findings of a partially obstructing oozing cratered gastric ulcer in the gastric antrum.  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  CT scans chest/abdomen/pelvis 10/03/2013 showed a 7 x 5 mm groundglass opacity in the superior segment of the right lower lobe; a 4.5 mm nodular density in the right upper lobe; bilateral  axillary adenopathy; low density area anteriorly in the left hepatic lobe most consistent with fatty infiltration; another hypodense area within the medial segment of the left hepatic lobe; severe gastric distention; mild bilateral inguinal adenopathy. Subtotal gastrectomy and lymph node dissection with creation of a gastrojejunostomy 10/10/2013. No evidence of distant metastatic disease noted at the time of surgery. No evidence of liver metastases. 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 Tumor involved the serosal surface. Resection margins were negative. 10 of 17 lymph nodes were positive for metastatic adenocarcinoma. 2. Gastric outlet obstruction secondary to #1. Resolved. 3. Nausea/vomiting secondary to #2. Resolved. 4. Abdominal pain secondary to #1 and #2. Improved. 5. Weight loss secondary to #1 and #2. 6. Microcytic anemia, likely iron deficiency. She received iron dextran 10/11/2013. 7. Lupus/Sjgren's. 8. Pneumonia during hospitalization July 2015.  9. Abdominal abscesses with body fluid culture 10/20/2013 showing moderate Candida tropicalis. She was discharged home on a 4 week course of fluconazole.    Disposition: Ms. Lockner is doing well today. She is ready to begin chemotherapy and has been cleared by her surgeon to begin. She is at high risk of developing recurrent gastric cancer. Dr. Benay Spice recommends adjuvant chemotherapy and radiation. She will receive adjuvant weekly 5-FU/leucovorin according to the "[place]" regimen for 2 months to be followed by concurrent capecitabine and radiation. We again reviewed potential toxicities associated with the chemotherapy. I have sent a prescription for Compazine to her pharmacy.   PET scan reviewed and lymph nodes in her axillae are likely related to her lupus. We will follow these clinically and if they enlarge, will consider a biopsy  at that time.  The abdominal wound is healing. She will continue daily  dressing changes and follow-up with Dr Excell Seltzer as scheduled.   Her pain is well-controlled. She may continue to use Oxycodone as needed.   Her anemia is significantly improved. She is taking a multivitamin and she will continue this.   She will return next week for chemotherapy and in 2 weeks for a visit and cycle 1 day 15 of her chemo.   Plan reviewed with Dr. Benay Spice. 25 minutes were spent face-to-face at today's visit with the majority of that time involved in counseling/coordination of care.   Mikey Bussing, DNP, AGPCNP-BC 11/14/2013  2:00 PM

## 2013-11-14 NOTE — Patient Instructions (Signed)
[facility] Discharge Instructions for Patients Receiving Chemotherapy  Today you received the following chemotherapy agents:  Leucovorin and 5FU.  To help prevent nausea and vomiting after your treatment, we encourage you to take your nausea medication as ordered per MD.   If you develop nausea and vomiting that is not controlled by your nausea medication, call the clinic.   BELOW ARE SYMPTOMS THAT SHOULD BE REPORTED IMMEDIATELY:  *FEVER GREATER THAN 100.5 F  *CHILLS WITH OR WITHOUT FEVER  NAUSEA AND VOMITING THAT IS NOT CONTROLLED WITH YOUR NAUSEA MEDICATION  *UNUSUAL SHORTNESS OF BREATH  *UNUSUAL BRUISING OR BLEEDING  TENDERNESS IN MOUTH AND THROAT WITH OR WITHOUT PRESENCE OF ULCERS  *URINARY PROBLEMS  *BOWEL PROBLEMS  UNUSUAL RASH Items with * indicate a potential emergency and should be followed up as soon as possible.  Feel free to call the clinic you have any questions or concerns. The clinic phone number is [phone].    

## 2013-11-16 ENCOUNTER — Other Ambulatory Visit: Payer: Self-pay | Admitting: Oncology

## 2013-11-17 ENCOUNTER — Telehealth: Payer: Self-pay | Admitting: *Deleted

## 2013-11-17 ENCOUNTER — Telehealth (INDEPENDENT_AMBULATORY_CARE_PROVIDER_SITE_OTHER): Payer: Self-pay | Admitting: General Surgery

## 2013-11-17 LAB — FUNGUS CULTURE W SMEAR
FUNGAL SMEAR: NONE SEEN
SPECIAL REQUESTS: NORMAL

## 2013-11-17 NOTE — Telephone Encounter (Signed)
Called Andrea Best for chemotherapy F/U.  Patient is doing well or better today.  Friday and Saturday felt tired.  Nauseated and vomited once on Saturday.  Denies use of compazine "Waiting approval for this."  Able to eat and drink and trying to eat "High calorie foods so I do not loose any weight.  Reports "ice cream did not sit well on her stomach".  Did not sleep well Saturday night but doesn't  know why.    No further new side effects or symptoms.  Bowel and bladder is functioning well.  Instructed to drink 64 oz minimum daily or at least the day before, of and after treatment.  Denies questions at this time and encouraged to call if needed.  Reviewed how to call after hours in the case of an emergency.  Called [facility] on [address] "Processing her compazine order at this time.  Pharmacist confirmed phone number stating they will call her when order is ready for pick up.

## 2013-11-17 NOTE — Telephone Encounter (Signed)
[facility], Margaretha Sheffield RN called regarding pt Andrea Best. Would like to continue home health, once a week, for wound care and education. Margaretha Sheffield call back # [phone].

## 2013-11-17 NOTE — Telephone Encounter (Signed)
Okay by me.

## 2013-11-17 NOTE — Telephone Encounter (Signed)
Message copied by Cherylynn Ridges on Mon Nov 17, 2013  5:37 PM ------      Message from: Cathlean Cower      Created: Fri Nov 14, 2013  4:34 PM      Regarding: chemo f/u call       1st time Leucovorin and 5 FU on 11/14/13.   ------

## 2013-11-18 ENCOUNTER — Other Ambulatory Visit (INDEPENDENT_AMBULATORY_CARE_PROVIDER_SITE_OTHER): Payer: Self-pay | Admitting: General Surgery

## 2013-11-18 NOTE — Telephone Encounter (Signed)
Called and spoke to [place] @ [facility] regarding wound care and education.  Verbal order given to Margaretha Sheffield, RN home health to continue wound care and educating patient once a week.

## 2013-11-19 ENCOUNTER — Encounter (HOSPITAL_COMMUNITY): Payer: Self-pay | Admitting: *Deleted

## 2013-11-20 ENCOUNTER — Ambulatory Visit (HOSPITAL_COMMUNITY)
Admission: RE | Admit: 2013-11-20 | Discharge: 2013-11-20 | Disposition: A | Discharge status: Home / Self Care | Payer: 59 | Source: Ambulatory Visit | Attending: General Surgery | Admitting: General Surgery

## 2013-11-20 ENCOUNTER — Encounter (HOSPITAL_COMMUNITY): Payer: Self-pay | Admitting: *Deleted

## 2013-11-20 ENCOUNTER — Telehealth: Payer: Self-pay | Admitting: *Deleted

## 2013-11-20 ENCOUNTER — Ambulatory Visit (HOSPITAL_COMMUNITY): Payer: 59

## 2013-11-20 ENCOUNTER — Emergency Department (HOSPITAL_COMMUNITY)
Admission: EM | Admit: 2013-11-20 | Discharge: 2013-11-20 | Disposition: A | Discharge status: Home / Self Care | Payer: 59 | Source: Home / Self Care | Attending: Emergency Medicine | Admitting: Emergency Medicine

## 2013-11-20 ENCOUNTER — Encounter (HOSPITAL_COMMUNITY): Payer: Self-pay | Admitting: Emergency Medicine

## 2013-11-20 ENCOUNTER — Encounter (HOSPITAL_COMMUNITY)
Admission: RE | Disposition: A | Discharge status: Home / Self Care | Payer: Self-pay | Source: Ambulatory Visit | Attending: General Surgery

## 2013-11-20 ENCOUNTER — Encounter (HOSPITAL_COMMUNITY): Payer: 59 | Admitting: Anesthesiology

## 2013-11-20 ENCOUNTER — Ambulatory Visit (HOSPITAL_COMMUNITY): Payer: 59 | Admitting: Anesthesiology

## 2013-11-20 DIAGNOSIS — Z862 Personal history of diseases of the blood and blood-forming organs and certain disorders involving the immune mechanism: Secondary | ICD-10-CM | POA: Insufficient documentation

## 2013-11-20 DIAGNOSIS — R51 Headache: Secondary | ICD-10-CM | POA: Insufficient documentation

## 2013-11-20 DIAGNOSIS — Z9889 Other specified postprocedural states: Secondary | ICD-10-CM

## 2013-11-20 DIAGNOSIS — Z79899 Other long term (current) drug therapy: Secondary | ICD-10-CM

## 2013-11-20 DIAGNOSIS — T433X5A Adverse effect of phenothiazine antipsychotics and neuroleptics, initial encounter: Secondary | ICD-10-CM

## 2013-11-20 DIAGNOSIS — R220 Localized swelling, mass and lump, head: Secondary | ICD-10-CM | POA: Insufficient documentation

## 2013-11-20 DIAGNOSIS — Z9221 Personal history of antineoplastic chemotherapy: Secondary | ICD-10-CM | POA: Insufficient documentation

## 2013-11-20 DIAGNOSIS — J45909 Unspecified asthma, uncomplicated: Secondary | ICD-10-CM | POA: Insufficient documentation

## 2013-11-20 DIAGNOSIS — K219 Gastro-esophageal reflux disease without esophagitis: Secondary | ICD-10-CM | POA: Insufficient documentation

## 2013-11-20 DIAGNOSIS — D649 Anemia, unspecified: Secondary | ICD-10-CM

## 2013-11-20 DIAGNOSIS — Z872 Personal history of diseases of the skin and subcutaneous tissue: Secondary | ICD-10-CM

## 2013-11-20 DIAGNOSIS — Z8639 Personal history of other endocrine, nutritional and metabolic disease: Secondary | ICD-10-CM

## 2013-11-20 DIAGNOSIS — C169 Malignant neoplasm of stomach, unspecified: Secondary | ICD-10-CM | POA: Diagnosis not present

## 2013-11-20 DIAGNOSIS — Z792 Long term (current) use of antibiotics: Secondary | ICD-10-CM | POA: Insufficient documentation

## 2013-11-20 DIAGNOSIS — R221 Localized swelling, mass and lump, neck: Secondary | ICD-10-CM | POA: Insufficient documentation

## 2013-11-20 DIAGNOSIS — Z8701 Personal history of pneumonia (recurrent): Secondary | ICD-10-CM | POA: Insufficient documentation

## 2013-11-20 DIAGNOSIS — Z85028 Personal history of other malignant neoplasm of stomach: Secondary | ICD-10-CM

## 2013-11-20 DIAGNOSIS — IMO0002 Reserved for concepts with insufficient information to code with codable children: Secondary | ICD-10-CM

## 2013-11-20 DIAGNOSIS — G2402 Drug induced acute dystonia: Secondary | ICD-10-CM

## 2013-11-20 HISTORY — DX: Gastro-esophageal reflux disease without esophagitis: K21.9

## 2013-11-20 HISTORY — PX: PORTACATH PLACEMENT: SHX2246

## 2013-11-20 LAB — CBC
HEMATOCRIT: 31.9 % — AB (ref 36.0–46.0)
HEMOGLOBIN: 10.4 g/dL — AB (ref 12.0–15.0)
MCH: 26 pg (ref 26.0–34.0)
MCHC: 32.6 g/dL (ref 30.0–36.0)
MCV: 79.8 fL (ref 78.0–100.0)
Platelets: 227 10*3/uL (ref 150–400)
RBC: 4 MIL/uL (ref 3.87–5.11)
RDW: 17.9 % — ABNORMAL HIGH (ref 11.5–15.5)
WBC: 3.2 10*3/uL — ABNORMAL LOW (ref 4.0–10.5)

## 2013-11-20 LAB — COMPREHENSIVE METABOLIC PANEL
ALK PHOS: 87 U/L (ref 39–117)
ALT: 16 U/L (ref 0–35)
AST: 24 U/L (ref 0–37)
Albumin: 3.1 g/dL — ABNORMAL LOW (ref 3.5–5.2)
Anion gap: 14 (ref 5–15)
BUN: 7 mg/dL (ref 6–23)
CHLORIDE: 101 meq/L (ref 96–112)
CO2: 22 meq/L (ref 19–32)
CREATININE: 0.52 mg/dL (ref 0.50–1.10)
Calcium: 9.3 mg/dL (ref 8.4–10.5)
GFR calc Af Amer: >90 mL/min (ref >90)
Glucose, Bld: 94 mg/dL (ref 70–99)
Potassium: 4.1 mEq/L (ref 3.7–5.3)
SODIUM: 137 meq/L (ref 137–147)
Total Bilirubin: 0.2 mg/dL — ABNORMAL LOW (ref 0.3–1.2)
Total Protein: 8.2 g/dL (ref 6.0–8.3)

## 2013-11-20 LAB — NO BLOOD PRODUCTS

## 2013-11-20 LAB — HCG, SERUM, QUALITATIVE: Preg, Serum: NEGATIVE

## 2013-11-20 SURGERY — INSERTION, TUNNELED CENTRAL VENOUS DEVICE, WITH PORT
Anesthesia: General | Laterality: Right

## 2013-11-20 MED ORDER — CEFAZOLIN SODIUM-DEXTROSE 2-3 GM-% IV SOLR
INTRAVENOUS | Status: AC
  Filled 2013-11-20: qty 50

## 2013-11-20 MED ORDER — DEXAMETHASONE SODIUM PHOSPHATE 10 MG/ML IJ SOLN
INTRAMUSCULAR | Status: DC | PRN
  Administered 2013-11-20: 10 mg via INTRAVENOUS

## 2013-11-20 MED ORDER — FENTANYL CITRATE 0.05 MG/ML IJ SOLN
INTRAMUSCULAR | Status: AC
  Filled 2013-11-20: qty 2

## 2013-11-20 MED ORDER — DEXAMETHASONE SODIUM PHOSPHATE 10 MG/ML IJ SOLN
INTRAMUSCULAR | Status: AC
  Filled 2013-11-20: qty 1

## 2013-11-20 MED ORDER — ONDANSETRON HCL 4 MG/2ML IJ SOLN
INTRAMUSCULAR | Status: DC | PRN
  Administered 2013-11-20: 4 mg via INTRAVENOUS

## 2013-11-20 MED ORDER — HEPARIN SOD (PORK) LOCK FLUSH 100 UNIT/ML IV SOLN
INTRAVENOUS | Status: AC
  Filled 2013-11-20: qty 5

## 2013-11-20 MED ORDER — SODIUM CHLORIDE 0.9 % IR SOLN
Freq: Once | Status: AC
  Administered 2013-11-20: 10:00:00
  Filled 2013-11-20: qty 1.2

## 2013-11-20 MED ORDER — CHLORHEXIDINE GLUCONATE 4 % EX LIQD: 1.0000 "application " | Freq: Once | CUTANEOUS | Status: DC

## 2013-11-20 MED ORDER — LACTATED RINGERS IV SOLN
INTRAVENOUS | Status: DC | PRN
  Administered 2013-11-20: 09:00:00 via INTRAVENOUS

## 2013-11-20 MED ORDER — MIDAZOLAM HCL 2 MG/2ML IJ SOLN
INTRAMUSCULAR | Status: AC
  Filled 2013-11-20: qty 2

## 2013-11-20 MED ORDER — BUPIVACAINE-EPINEPHRINE (PF) 0.5% -1:200000 IJ SOLN
INTRAMUSCULAR | Status: DC | PRN
  Administered 2013-11-20: 12 mL

## 2013-11-20 MED ORDER — HEPARIN SOD (PORK) LOCK FLUSH 100 UNIT/ML IV SOLN
INTRAVENOUS | Status: DC | PRN
  Administered 2013-11-20: 500 [IU]

## 2013-11-20 MED ORDER — PROMETHAZINE HCL 25 MG/ML IJ SOLN: 6.2500 mg | INTRAMUSCULAR | Status: DC | PRN

## 2013-11-20 MED ORDER — BUPIVACAINE-EPINEPHRINE 0.5% -1:200000 IJ SOLN
INTRAMUSCULAR | Status: AC
  Filled 2013-11-20: qty 1

## 2013-11-20 MED ORDER — PROPOFOL 10 MG/ML IV BOLUS
INTRAVENOUS | Status: AC
  Filled 2013-11-20: qty 20

## 2013-11-20 MED ORDER — LIDOCAINE HCL (CARDIAC) 20 MG/ML IV SOLN
INTRAVENOUS | Status: DC | PRN
  Administered 2013-11-20: 100 mg via INTRAVENOUS

## 2013-11-20 MED ORDER — MIDAZOLAM HCL 5 MG/5ML IJ SOLN
INTRAMUSCULAR | Status: DC | PRN
  Administered 2013-11-20: 2 mg via INTRAVENOUS

## 2013-11-20 MED ORDER — FENTANYL CITRATE 0.05 MG/ML IJ SOLN
25.0000 ug | INTRAMUSCULAR | Status: DC | PRN
  Administered 2013-11-20: 50 ug via INTRAVENOUS

## 2013-11-20 MED ORDER — ONDANSETRON HCL 4 MG/2ML IJ SOLN
INTRAMUSCULAR | Status: AC
  Filled 2013-11-20: qty 2

## 2013-11-20 MED ORDER — DIPHENHYDRAMINE HCL 50 MG/ML IJ SOLN
25.0000 mg | Freq: Once | INTRAMUSCULAR | Status: AC
  Administered 2013-11-20: 25 mg via INTRAMUSCULAR
  Filled 2013-11-20: qty 1

## 2013-11-20 MED ORDER — OXYCODONE HCL 5 MG PO TABS: 5.0000 mg | ORAL_TABLET | Freq: Four times a day (QID) | ORAL | Status: DC | PRN

## 2013-11-20 MED ORDER — PROPOFOL 10 MG/ML IV BOLUS
INTRAVENOUS | Status: DC | PRN
  Administered 2013-11-20: 150 mg via INTRAVENOUS

## 2013-11-20 MED ORDER — ONDANSETRON 8 MG PO TBDP: 8.0000 mg | ORAL_TABLET | Freq: Three times a day (TID) | ORAL | Status: DC | PRN

## 2013-11-20 MED ORDER — CEFAZOLIN SODIUM-DEXTROSE 2-3 GM-% IV SOLR
2.0000 g | INTRAVENOUS | Status: AC
  Administered 2013-11-20: 2 g via INTRAVENOUS

## 2013-11-20 MED ORDER — LIDOCAINE HCL (CARDIAC) 20 MG/ML IV SOLN
INTRAVENOUS | Status: AC
  Filled 2013-11-20: qty 5

## 2013-11-20 MED ORDER — FENTANYL CITRATE 0.05 MG/ML IJ SOLN
INTRAMUSCULAR | Status: DC | PRN
  Administered 2013-11-20 (×2): 50 ug via INTRAVENOUS

## 2013-11-20 SURGICAL SUPPLY — 29 items
BAG DECANTER FOR FLEXI CONT (MISCELLANEOUS) ×3 IMPLANT
BLADE HEX COATED 2.75 (ELECTRODE) ×3 IMPLANT
BLADE SURG SZ10 CARB STEEL (BLADE) ×3 IMPLANT
COVER PROBE U/S 5X48 (MISCELLANEOUS) ×6 IMPLANT
DECANTER SPIKE VIAL GLASS SM (MISCELLANEOUS) ×3 IMPLANT
DRAPE C-ARM 42X120 X-RAY (DRAPES) ×3 IMPLANT
DRAPE LAPAROSCOPIC ABDOMINAL (DRAPES) ×3 IMPLANT
ELECT REM PT RETURN 9FT ADLT (ELECTROSURGICAL) ×3
ELECTRODE REM PT RTRN 9FT ADLT (ELECTROSURGICAL) ×1 IMPLANT
GAUZE SPONGE 4X4 16PLY XRAY LF (GAUZE/BANDAGES/DRESSINGS) ×3 IMPLANT
GLOVE BIOGEL PI IND STRL 7.0 (GLOVE) IMPLANT
GLOVE BIOGEL PI INDICATOR 7.0 (GLOVE)
GLOVE EUDERMIC 7 POWDERFREE (GLOVE) ×15 IMPLANT
GOWN STRL REUS W/TWL LRG LVL3 (GOWN DISPOSABLE) ×3 IMPLANT
GOWN STRL REUS W/TWL XL LVL3 (GOWN DISPOSABLE) ×3 IMPLANT
KIT BASIN OR (CUSTOM PROCEDURE TRAY) ×3 IMPLANT
KIT PORT POWER 8FR ISP CVUE (Catheter) ×3 IMPLANT
MARKER SKIN DUAL TIP RULER LAB (MISCELLANEOUS) ×3 IMPLANT
NEEDLE HYPO 22GX1.5 SAFETY (NEEDLE) ×3 IMPLANT
NEEDLE HYPO 25X1 1.5 SAFETY (NEEDLE) ×3 IMPLANT
PACK BASIC VI WITH GOWN DISP (CUSTOM PROCEDURE TRAY) ×3 IMPLANT
PENCIL BUTTON HOLSTER BLD 10FT (ELECTRODE) ×3 IMPLANT
SUT MNCRL AB 4-0 PS2 18 (SUTURE) ×3 IMPLANT
SUT PROLENE 2 0 SH DA (SUTURE) ×6 IMPLANT
SUT VIC AB 3-0 SH 18 (SUTURE) ×3 IMPLANT
SYR BULB IRRIGATION 50ML (SYRINGE) ×3 IMPLANT
SYR CONTROL 10ML LL (SYRINGE) ×6 IMPLANT
SYRINGE 10CC LL (SYRINGE) ×9 IMPLANT
TOWEL OR 17X26 10 PK STRL BLUE (TOWEL DISPOSABLE) ×3 IMPLANT

## 2013-11-20 NOTE — Anesthesia Preprocedure Evaluation (Addendum)
Anesthesia Evaluation  Patient identified by MRN, date of birth, ID band Patient awake    Reviewed: Allergy & Precautions, H&P , NPO status , Patient's Chart, lab work & pertinent test results  Airway Mallampati: II TM Distance: >3 FB Neck ROM: Full    Dental  (+) Edentulous Upper, Edentulous Lower, Dental Advisory Given   Pulmonary asthma , pneumonia -, resolved,  breath sounds clear to auscultation  Pulmonary exam normal       Cardiovascular Exercise Tolerance: Good negative cardio ROS  Rhythm:Regular Rate:Normal     Neuro/Psych negative neurological ROS  negative psych ROS   GI/Hepatic Neg liver ROS, GERD-  Medicated,  Endo/Other  negative endocrine ROS  Renal/GU negative Renal ROS  negative genitourinary   Musculoskeletal negative musculoskeletal ROS (+)   Abdominal   Peds negative pediatric ROS (+)  Hematology  (+) anemia ,   Anesthesia Other Findings   Reproductive/Obstetrics negative OB ROS                          Anesthesia Physical Anesthesia Plan  ASA: II  Anesthesia Plan: General   Post-op Pain Management:    Induction: Intravenous  Airway Management Planned: LMA  Additional Equipment:   Intra-op Plan:   Post-operative Plan: Extubation in OR  Informed Consent: I have reviewed the patients History and Physical, chart, labs and discussed the procedure including the risks, benefits and alternatives for the proposed anesthesia with the patient or authorized representative who has indicated his/her understanding and acceptance.   Dental advisory given  Plan Discussed with: CRNA  Anesthesia Plan Comments:         Anesthesia Quick Evaluation

## 2013-11-20 NOTE — H&P (Signed)
  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 She had some degree of wound infection in the hospital had a portion of her wound open. This has been doing well per patient. She states she is eating without pain or nausea and her appetite is returning. She has been evaluated by medical oncology and will be starting chemotherapy. She had a PET scan yesterday.   Exam:  BP 126/74  Pulse 78  Temp(Src) 98 F (36.7 C)  Ht 5\' 6"  (1.676 m)  Wt 150 lb (68.04 kg)  BMI 24.22 kg/m2  LMP 09/11/2013  General: Appears well  Lungs clear to auscultation bilaterally baseline lymphatics no cervical or supraclavicular adenopathy. Heart: Regular rate and rhythm. No murmur. No ectopy. Abdomen: Soft and nontender. There is about a 3 cm open area in the lower incision that is Completely clean granulation tissue. There is a small thin fluctuant area in the upper incision which I opened with an 18-gauge needle and drained a few cc of pus. This was packed with gauze as was the lower incision.    Last hemoglobin was 7.7. She is on iron.  PET scan was reviewed which shows some increased activity in the axillary lymph nodes right greater than left which I suspect is secondary to her lupus. There is marked increase activity in the gastric bed and then the incision was certainly could be postoperative change. No other evidence of distant metastasis   Assessment and plan:  Overall doing well following Distal subtotal gastrectomy.  10 of 17 nodes positive. No distant metastatic disease Okay to begin chemotherapy. Continue current wound care. Return in 6 weeks. Microcytic anemia, likely R. Deficiency Lupus Sjgren's Recent pneumonia during hospitalization, July 2015 Abdominal abscess with body fluid  culture showing moderate candida tropicalis, on fluconazole Schedule PAC insertion per request by Dr. Benay Spice.   Edsel Petrin. Dalbert Batman, M.D., [facility] General and Minimally invasive Surgery Breast and Colorectal Surgery Office:   [phone] Pager:   [phone]

## 2013-11-20 NOTE — ED Provider Notes (Signed)
CSN: [id number]     Arrival date & time 11/20/13  2009 History   First MD Initiated Contact with Patient 11/20/13 2056     Chief Complaint  Patient presents with  . Facial Swelling  . Post-op Problem     (Consider location/radiation/quality/duration/timing/severity/associated sxs/prior Treatment) The history is provided by the patient.   patient had a Port-A-Cath placed for chemotherapy today. She states that this evening at around 7:00 she developed tightening of the left side of her face and contraction of her left hand. She had dull headache with it also. She states it had resolved before EMS got there. While I was interviewing her in the ER she developed symptoms again. Her face was drawn up, however she did not have any symptoms in her left hand. She is on chemotherapy with her last treatment 6 days ago. She states that she also took Compazine about an hour prior to the event. She has not had episodes of this before.  Past Medical History  Diagnosis Date  . Eczema   . Lupus     joint pain and extreme fatique - improved after plaquenil and prednisone therapy  . Sjogren's disease     caused pt to loose her teeth due to dry mouth;  prone to eye irritation and infection due to dry eye; affects immune system - PRONE TO INFECTIONS   . Allergy   . Tachycardia   . Protein-calorie malnutrition, severe   . Abnormal findings on esophagogastroduodenoscopy (EGD) 09/22/13    esophagitis  . Asthma     no recent flare ups - no inhalers - as of 11/19/13  . GERD (gastroesophageal reflux disease)   . Anemia     iron deficiency   . Pneumonia     health care -associated July 2015 post op gastrectomy surgery  . 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   Past Surgical History  Procedure Laterality Date  . Cesarean section      1997  . Laparotomy N/A 10/10/2013    Procedure: SUBTOTAL GASTRECTOMY WITH GASTRIC  JEJUNOSOTMY;  Surgeon: Edward Jolly, MD;  Location: [facility]S;  Service: General;  Laterality: N/A;   Family History  Problem Relation Age of Onset  . Hypertension Mother   . Diabetes Mother   . Diabetes Maternal Grandmother   . Diabetes Maternal Grandfather    History  Substance Use Topics  . Smoking status: Never Smoker   . Smokeless tobacco: Never Used  . Alcohol Use: Yes     Comment: very occasional- none since feb 2015 when pt got sick   OB History   Grav Para Term Preterm Abortions TAB SAB Ect Mult Living   1 1  1            Obstetric Comments   27 weeks, NICU stay. C-section secondary to Pre-E     Review of Systems  Constitutional: Negative for activity change and appetite change.  Eyes: Negative for pain.  Respiratory: Negative for chest tightness and shortness of breath.   Cardiovascular: Negative for chest pain and leg swelling.  Gastrointestinal: Negative for nausea, vomiting, abdominal pain and diarrhea.  Genitourinary: Negative for flank pain.  Musculoskeletal: Negative for back pain and neck stiffness.  Skin: Positive for wound. Negative for rash.  Neurological: Positive for headaches. Negative for weakness and numbness.  Psychiatric/Behavioral: Negative for behavioral problems.      Allergies  Azithromycin; Sulfa antibiotics; Hydrocodone-acetaminophen;  and Other  Home Medications   Prior to Admission medications   Medication Sig Start Date End Date Taking? Authorizing Provider  Bepotastine Besilate (BEPREVE) 1.5 % SOLN Place 1 drop into both eyes daily with breakfast.   Yes Historical Provider, MD  doxycycline (DORYX) 100 MG DR capsule Take 100 mg by mouth 2 (two) times daily. For 7 days 11/14/13  Yes Historical Provider, MD  fluconazole (DIFLUCAN) 100 MG tablet Take 1 tablet (100 mg total) by mouth daily. 10/27/13  Yes Nishant Dhungel, MD  hydroxychloroquine (PLAQUENIL) 200 MG tablet Take 200 mg by mouth 2 (two) times daily.   Yes Historical Provider,  MD  lactose free nutrition (BOOST PLUS) LIQD Take 237 mLs by mouth 2 (two) times daily between meals. 10/27/13  Yes Nishant Dhungel, MD  lansoprazole (PREVACID SOLUTAB) 15 MG disintegrating tablet Take 15 mg by mouth 2 (two) times daily before a meal.   Yes Historical Provider, MD  Melatonin 1 MG TABS Take 5 mg by mouth at bedtime.   Yes Historical Provider, MD  Multiple Vitamin (MULTIVITAMIN WITH MINERALS) TABS tablet Take 1 tablet by mouth daily. 10/27/13  Yes Nishant Dhungel, MD  oxyCODONE (OXY IR/ROXICODONE) 5 MG immediate release tablet Take 1-2 tablets (5-10 mg total) by mouth every 6 (six) hours as needed for severe pain. 11/20/13  Yes Adin Hector, MD  polyethylene glycol [facility] / Floria Raveling) packet Take 17 g by mouth daily as needed for mild constipation.    Yes Historical Provider, MD  predniSONE (DELTASONE) 5 MG tablet Take 5 mg by mouth 2 (two) times daily with a meal.   Yes Historical Provider, MD  PRESCRIPTION MEDICATION fluorouracil (ADRUCIL) chemo injection 1,050 mg 600 mg/m2  1.79 m2 (Treatment Plan Actual)  Once   Yes Historical Provider, MD  prochlorperazine (COMPAZINE) 10 MG tablet Take 1 tablet (10 mg total) by mouth every 6 (six) hours as needed for nausea or vomiting. 11/14/13  Yes Maryanna Shape, NP  ondansetron (ZOFRAN-ODT) 8 MG disintegrating tablet Take 1 tablet (8 mg total) by mouth every 8 (eight) hours as needed for nausea or vomiting. 11/20/13   Jasper Riling. Doctor Sheahan, MD   BP 114/71  Pulse 80  Temp(Src) 97.8 F (36.6 C) (Oral)  Resp 16  Ht 5\' 6"  (1.676 m)  Wt 150 lb (68.04 kg)  BMI 24.22 kg/m2  SpO2 99%  LMP 11/14/2013 Physical Exam  Constitutional: She is oriented to person, place, and time. She appears well-developed and well-nourished.  HENT:  Head: Normocephalic.  Eyes: EOM are normal. Pupils are equal, round, and reactive to light.  Neck: Normal range of motion.  Cardiovascular: Normal rate and regular rhythm.   Pulmonary/Chest: Effort normal and breath  sounds normal.  Musculoskeletal: She exhibits edema.  Neurological: She is alert and oriented to person, place, and time.  Patient's labs are drawn back on the left side. She's able to smile. At rest there is some shift of the left. Normal neurologic examination bilateral upper extremities.  Skin: Skin is warm.    ED Course  Procedures (including critical care time) Labs Review Labs Reviewed - No data to display  Imaging Review Dg Chest 2 View  11/20/2013   CLINICAL DATA:  Preop for Port-A-Cath placement. History of gastric cancer. Asthma.  EXAM: CHEST  2 VIEW  COMPARISON:  Chest radiograph 09/2013.  PET 11/11/2013.  FINDINGS: Midline trachea. Normal heart size and mediastinal contours. No pleural effusion or pneumothorax. Surgical clips in the upper abdomen. Removal of right-sided  PICC line.  IMPRESSION: No acute cardiopulmonary disease.   Electronically Signed   By: Abigail Miyamoto M.D.   On: 11/20/2013 08:36   Chest Port 1 View  11/20/2013   CLINICAL DATA:  Power port placement.  EXAM: DG C-ARM 1-60 MIN - NRPT [facility]; PORTABLE CHEST - 1 VIEW  COMPARISON:  Chest x-ray 10/13/2013.  FINDINGS: Power port CT noted good anatomic position. Mediastinal structures are normal. Low lungs are clear of acute infiltrates. No pleural effusion or pneumothorax. Heart size normal. No acute osseous abnormality.  IMPRESSION: Power port catheter noted in good anatomic position. No acute cardiopulmonary disease.   Electronically Signed   By: Marcello Moores  Register   On: 11/20/2013 10:52   Dg C-arm 1-60 Min-no Report  11/20/2013   CLINICAL DATA:  Power port placement.  EXAM: DG C-ARM 1-60 MIN - NRPT [facility]; PORTABLE CHEST - 1 VIEW  COMPARISON:  Chest x-ray 10/13/2013.  FINDINGS: Power port CT noted good anatomic position. Mediastinal structures are normal. Low lungs are clear of acute infiltrates. No pleural effusion or pneumothorax. Heart size normal. No acute osseous abnormality.  IMPRESSION: Power port catheter noted in good  anatomic position. No acute cardiopulmonary disease.   Electronically Signed   By: Marcello Moores  Register   On: 11/20/2013 10:52     EKG Interpretation None      MDM   Final diagnoses:  Acute dystonic reaction due to drugs   Patient's face is strong the left. Likely due to Compazine reaction. She had a second episode on the ER. It resolved immediately after getting IM Benadryl. Will discharge home.    Jasper Riling. Alvino Chapel, MD 11/20/13 2157

## 2013-11-20 NOTE — Anesthesia Postprocedure Evaluation (Signed)
  Anesthesia Post-op Note  Patient: Andrea Best  Procedure(s) Performed: Procedure(s) (LRB): INSERTION PORT-A-CATH with ULTRA SOUND (Right)  Patient Location: PACU  Anesthesia Type: General  Level of Consciousness: awake and alert   Airway and Oxygen Therapy: Patient Spontanous Breathing  Post-op Pain: mild  Post-op Assessment: Post-op Vital signs reviewed, Patient's Cardiovascular Status Stable, Respiratory Function Stable, Patent Airway and No signs of Nausea or vomiting  Last Vitals:  Filed Vitals:   11/20/13 1210  BP: 112/64  Pulse: 102  Temp: 36.7 C  Resp: 20    Post-op Vital Signs: stable   Complications: No apparent anesthesia complications

## 2013-11-20 NOTE — Interval H&P Note (Signed)
History and Physical Interval Note:  11/20/2013 8:54 AM  Andrea Best  has presented today for surgery, with the diagnosis of cancer of stomach and poor venous access  The goals and the various methods of treatment have been discussed with the patient and family. I have discussed the indications, details, techniques, and numerous risk Port-A-Cath insertion. She's aware of the risk of bleeding, infection, catheter malfunction requiring revision, pneumothorax, nerve damage with chronic pain, and other on proceeding problems. She understands all of these issues. All of her questions are answered. She agrees with this plan.   After consideration of risks, benefits and other options for treatment, the patient has consented to  Procedure(s): INSERTION PORT-A-CATH POSSIBLE ULTRA SOUND (N/A) as a surgical intervention .  The patient's history has been reviewed, patient examined, no change in status, stable for surgery.  I have reviewed the patient's chart and labs.  Questions were answered to the patient's satisfaction.     Adin Hector

## 2013-11-20 NOTE — Op Note (Signed)
Patient Name:           Andrea Best   Date of Surgery:        11/20/2013  Note: This dictation was prepared with Dragon/digital dictation along with [facility] technology. Any transcriptional errors that result from this process are unintentional.   Pre op Diagnosis:      Gastric cancer  Post op Diagnosis:    Gastric cancer  Procedure:                 Insertion of 8 [place], [employer] tunneled venous vascular access device Using fluoroscopy for guidance and positioning Use of ultrasound to facilitate venipuncture and catheter placement  Surgeon:                     Edsel Petrin. Dalbert Batman, M.D., FACS  Assistant:                      none  Operative Indications:   This is a 43 year old woman who underwent urgent distal subtotal gastrectomy and regional lymph node biopsy approximately 5-6 weeks ago. She is followed by Dr. Excell Seltzer and Dr. Benay Spice. She has had one cycle of adjuvant chemotherapy. Dr. Benay Spice has requested a port. The patient has been counseled regarding the details and risks of this and agrees to this procedure.  Operative Findings:       The patient had a patent subclavian vein, but I could not thread the wire through this following 2 attempts. I was able to place the catheter through the right internal jugular vein without difficulty. Intraoperative imaging looked good.  Procedure in Detail:          Following the induction of general LMA anesthesia the patient was positioned with a roll behind her shoulders and her arms tucked at her sides. The neck and chest was prepped and draped in a sterile fashion. Intravenous antibiotics were given. 0.5% Marcaine with epinephrine was used as local infiltration anesthetic.    With the patient in a Trendelenburg position a right subclavian venipuncture was performed. A blood return but could not thread the wire. I repeated this attempt a blood return again but the wire would not thread and so I abandoned this approach.  Ultrasound was placed on the operative field. I was able to image the right carotid artery and right internal jugular vein.  Right internal jugular venipuncture was performed under direct visualization with good blood return. Guidewire was easily threaded through the needle. Fluoroscopy confirmed that the catheter was in the superior vena cava and right atrium. A small incision was made at the wire insertion site. Using the fluoroscope and the marking pen I marked a template on the chest wall to guide catheter positioning and leg with the intent of the tip of the catheter to be at the SVC right atrial junction. I then made a transverse incision about 2 cm below the clavicle. Subcutaneous pocket was created at the level of the pectoralis fascia. Using a tunneling device I passed the catheter from the neck incision to the port pocket incision. Using the template that was drawn chest wall I measured the catheter and cut at 23.5 cm. The catheter was secured to the port and flushed with heparinized saline. The port was sutured to the pectoralis fascia with Prolene sutures.    With the patient in Trendelenburg I passed the dilator and peel-away sheath assembly over the wire, removed the dilator and wire and threaded the  catheter through the peel-away sheath and removed the peel-away sheath. This threaded easily. I had excellent blood return and the catheter flushed well. Fluoroscopy confirmed the tip of the catheter in the SVC at the right atrial junction. The subcutaneous tissue was closed with 3-0 Vicryl sutures and the skin incisions closed with subcuticular sutures of 4-0 Monocryl and Steri-Strips. I then brought a needle and the extension tubing assembly to the field and this was flushed with heparinized saline. The needle was inserted through the skin into the port. I had excellent blood return and it flushed well. This was then flushed with heparinized saline and clamped off. The needle and the IV extension tubing  was carefully taped to the skin with sterile multiple Steri-Strips and clean bandages were placed. The patient tolerated the procedure well and was taken to PACU in stable condition. EBL 10 cc. Counts correct. Complications none.     Edsel Petrin. Dalbert Batman, M.D., FACS General and Minimally Invasive Surgery Breast and Colorectal Surgery  11/20/2013 10:15 AM

## 2013-11-20 NOTE — Discharge Instructions (Signed)
    PORT-A-CATH: POST OP INSTRUCTIONS  Always review your discharge instruction sheet given to you by the facility where your surgery was performed.   1. A prescription for pain medication may be given to you upon discharge. Take your pain medication as prescribed, if needed. If narcotic pain medicine is not needed, then you make take acetaminophen (Tylenol) or ibuprofen (Advil) as needed.  2. Take your usually prescribed medications unless otherwise directed. 3. If you need a refill on your pain medication, please contact our office. All narcotic pain medicine now requires a paper prescription.  Phoned in and fax refills are no longer allowed by law.  Prescriptions will not be filled after 5 pm or on weekends.  4. You should follow a light diet for the remainder of the day after your procedure. 5. Most patients will experience some mild swelling and/or bruising in the area of the incision. It may take several days to resolve. 6. It is common to experience some constipation if taking pain medication after surgery. Increasing fluid intake and taking a stool softener (such as Colace) will usually help or prevent this problem from occurring. A mild laxative (Milk of Magnesia or Miralax) should be taken according to package directions if there are no bowel movements after 48 hours.  7. Unless discharge instructions indicate otherwise, you may remove your bandages 48 hours after surgery, and you may shower at that time. You may have steri-strips (small white skin tapes) in place directly over the incision.  These strips should be left on the skin for 7-10 days.  If your surgeon used Dermabond (skin glue) on the incision, you may shower in 24 hours.  The glue will flake off over the next 2-3 weeks.  8. If your port is left accessed at the end of surgery (needle left in port), the dressing cannot get wet and should only by changed by a healthcare professional. When the port is no longer accessed (when the  needle has been removed), follow step 7.   9. ACTIVITIES:  Limit activity involving your arms for the next 72 hours. Do no strenuous exercise or activity for 1 week. You may drive when you are no longer taking prescription pain medication, you can comfortably wear a seatbelt, and you can maneuver your car. 10.You may need to see your doctor in the office for a follow-up appointment.  Please       check with your doctor.  11.When you receive a new Port-a-Cath, you will get a product guide and        ID card.  Please keep them in case you need them.  WHEN TO CALL YOUR DOCTOR ([phone]): 1. Fever over 101.0 2. Chills 3. Continued bleeding from incision 4. Increased redness and tenderness at the site 5. Shortness of breath, difficulty breathing   The clinic staff is available to answer your questions during regular business hours. Please don't hesitate to call and ask to speak to one of the nurses or medical assistants for clinical concerns. If you have a medical emergency, go to the nearest emergency room or call 911.  A surgeon from [facility] is always on call at the hospital.     For further information, please visit [email or web address]      

## 2013-11-20 NOTE — ED Notes (Signed)
Pt c/o L sided facial swelling about an hour ago. Pt sts that her L hand contracted at the same time and her entire L side "felt funny." Pt denies trouble walking, slurred speech, weakness. Pt sts that she is back to her baseline now. No visible facial swelling, her L hand is no longer contracted. Pt denies symptoms at this time. Pt is scheduled for chemo tomorrow. Pt has family at bedside. Pt denies shortness of breath, difficulty swallowing, or drooling.

## 2013-11-20 NOTE — Transfer of Care (Signed)
Immediate Anesthesia Transfer of Care Note  Patient: Andrea Best  Procedure(s) Performed: Procedure(s): INSERTION PORT-A-CATH with ULTRA SOUND (Right)  Patient Location: PACU  Anesthesia Type:General  Level of Consciousness: awake, alert  and oriented  Airway & Oxygen Therapy: Patient Spontanous Breathing and Patient connected to nasal cannula oxygen  Post-op Assessment: Report given to PACU RN and Post -op Vital signs reviewed and stable  Post vital signs: Reviewed and stable  Complications: No apparent anesthesia complications

## 2013-11-20 NOTE — Discharge Instructions (Signed)
Dystonic Reaction °A dystonic reaction is generally a side effect to a particular medication. Often the medications are used to treat psychological or psychiatric conditions. They often come from other common medications such as antihistamines, cimetidine, doxepin, and bromocriptine. These reactions occur when the normal patterns of our nerve receptors are upset by a particular medication and the imbalance causes multiple types of muscle spasm. This not a drug allergy. It is your own particular response to the particular medication you have taken. °DIAGNOSIS  °This diagnosis (learning what is wrong) is made by the obvious symptoms (problems) of contraction of multiple muscles in the body and the usual rapid response to treatment. Because of multiple muscle groups contracting, it is associated with abnormal movements of the face, tongue, neck, abdomen (belly), back and with bizarre grimacing. This illness is rarely life threatening and generally responds within minutes to Benadryl, Cogentin, or Valium. Although sometimes frightening, it is usually over in minutes. If the reaction is not a reaction to medications, additional workup may have to be done to rule out other causes. °HOME CARE INSTRUCTIONS  °· Generally, after the reaction is over, there will be no return of the disorder. °· In the future, avoid use of medications that were thought to be the cause of this. °· Do not drive or perform tasks after treatment until medications used to treat have worn off, or until approved by your caregiver. °· See your caregiver if there is a return of the symptoms which brought you to your caregiver or emergency department. °MAKE SURE YOU:  °· Understand these instructions. °· Will watch your condition. °· Will get help right away if you are not doing well or get worse. °Document Released: 03/10/2000 Document Revised: 07/28/2013 Document Reviewed: 10/30/2007 °ExitCare® Patient Information ©2015 [employer]. This information  is not intended to replace advice given to you by your health care provider. Make sure you discuss any questions you have with your health care provider. ° °

## 2013-11-20 NOTE — Telephone Encounter (Signed)
Left VM requesting to make changes in her chemo treatment dates. Message forwarded to scheduler.

## 2013-11-20 NOTE — ED Notes (Signed)
Bed: [facility] Expected date:  Expected time:  Means of arrival:  Comments: 

## 2013-11-20 NOTE — ED Notes (Signed)
Per EMS, Pt had a port put in on the R side of her chest at 10:30 am today. About 45 minutes ago pt noticed swelling to L side of her face. Pt has gastric cancer. In July pt had part of her stomach removed. Pt also sts that she has been losing her voice. Pt started chemo on Friday. A&Ox4. Denies pain, difficulty swallowing, or difficulty breathing.

## 2013-11-21 ENCOUNTER — Encounter (HOSPITAL_COMMUNITY): Payer: Self-pay | Admitting: General Surgery

## 2013-11-21 ENCOUNTER — Ambulatory Visit (HOSPITAL_BASED_OUTPATIENT_CLINIC_OR_DEPARTMENT_OTHER): Payer: 59

## 2013-11-21 ENCOUNTER — Other Ambulatory Visit: Payer: 59

## 2013-11-21 ENCOUNTER — Telehealth: Payer: Self-pay | Admitting: *Deleted

## 2013-11-21 ENCOUNTER — Encounter: Payer: 59 | Admitting: Nutrition

## 2013-11-21 DIAGNOSIS — C169 Malignant neoplasm of stomach, unspecified: Secondary | ICD-10-CM

## 2013-11-21 DIAGNOSIS — C163 Malignant neoplasm of pyloric antrum: Secondary | ICD-10-CM

## 2013-11-21 DIAGNOSIS — Z5111 Encounter for antineoplastic chemotherapy: Secondary | ICD-10-CM

## 2013-11-21 MED ORDER — HEPARIN SOD (PORK) LOCK FLUSH 100 UNIT/ML IV SOLN
500.0000 [IU] | Freq: Once | INTRAVENOUS | Status: AC | PRN
  Administered 2013-11-21: 500 [IU]
  Filled 2013-11-21: qty 5

## 2013-11-21 MED ORDER — FLUOROURACIL CHEMO INJECTION 2.5 GM/50ML
600.0000 mg/m2 | Freq: Once | INTRAVENOUS | Status: AC
  Administered 2013-11-21: 1050 mg via INTRAVENOUS
  Filled 2013-11-21: qty 21

## 2013-11-21 MED ORDER — LEUCOVORIN CALCIUM INJECTION 350 MG
500.0000 mg/m2 | Freq: Once | INTRAVENOUS | Status: AC
  Administered 2013-11-21: 896 mg via INTRAVENOUS
  Filled 2013-11-21: qty 44.8

## 2013-11-21 MED ORDER — SODIUM CHLORIDE 0.9 % IJ SOLN
10.0000 mL | INTRAMUSCULAR | Status: DC | PRN
  Administered 2013-11-21: 10 mL
  Filled 2013-11-21: qty 10

## 2013-11-21 MED ORDER — SODIUM CHLORIDE 0.9 % IV SOLN
Freq: Once | INTRAVENOUS | Status: AC
  Administered 2013-11-21: 11:00:00 via INTRAVENOUS

## 2013-11-21 NOTE — Patient Instructions (Signed)
[facility] Discharge Instructions for Patients Receiving Chemotherapy  Today you received the following chemotherapy agents Leucovorin and 5FU.  To help prevent nausea and vomiting after your treatment, we encourage you to take your nausea medication as prescribed. NO COMPAZINE.   If you develop nausea and vomiting that is not controlled by your nausea medication, call the clinic.   BELOW ARE SYMPTOMS THAT SHOULD BE REPORTED IMMEDIATELY:  *FEVER GREATER THAN 100.5 F  *CHILLS WITH OR WITHOUT FEVER  NAUSEA AND VOMITING THAT IS NOT CONTROLLED WITH YOUR NAUSEA MEDICATION  *UNUSUAL SHORTNESS OF BREATH  *UNUSUAL BRUISING OR BLEEDING  TENDERNESS IN MOUTH AND THROAT WITH OR WITHOUT PRESENCE OF ULCERS  *URINARY PROBLEMS  *BOWEL PROBLEMS  UNUSUAL RASH Items with * indicate a potential emergency and should be followed up as soon as possible.  Feel free to call the clinic you have any questions or concerns. The clinic phone number is [phone].

## 2013-11-21 NOTE — Progress Notes (Signed)
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 Per MD, add Compazine to allergies, notify pharmacy and instruct patient to no longer take compazine.

## 2013-11-21 NOTE — Telephone Encounter (Signed)
Per staff message on my desk, patient request changes to her schedule. I have called her and she wants no changes at this time

## 2013-11-23 ENCOUNTER — Other Ambulatory Visit: Payer: Self-pay | Admitting: Oncology

## 2013-11-24 ENCOUNTER — Other Ambulatory Visit: Payer: Self-pay | Admitting: *Deleted

## 2013-11-24 MED ORDER — LIDOCAINE-PRILOCAINE 2.5-2.5 % EX CREA: TOPICAL_CREAM | CUTANEOUS | Status: DC

## 2013-11-24 NOTE — Telephone Encounter (Signed)
Left message notifying pt that EMLA cream has been sent to her pharmacy; call if problems.

## 2013-11-28 ENCOUNTER — Ambulatory Visit (HOSPITAL_BASED_OUTPATIENT_CLINIC_OR_DEPARTMENT_OTHER): Payer: 59

## 2013-11-28 ENCOUNTER — Ambulatory Visit (HOSPITAL_BASED_OUTPATIENT_CLINIC_OR_DEPARTMENT_OTHER): Payer: 59 | Admitting: Oncology

## 2013-11-28 ENCOUNTER — Other Ambulatory Visit: Payer: 59

## 2013-11-28 ENCOUNTER — Other Ambulatory Visit (HOSPITAL_BASED_OUTPATIENT_CLINIC_OR_DEPARTMENT_OTHER): Payer: 59

## 2013-11-28 ENCOUNTER — Encounter: Payer: Self-pay | Admitting: Oncology

## 2013-11-28 VITALS — BP 104/60 | HR 91 | Temp 98.4°F | Resp 18 | Ht 66.0 in | Wt 148.7 lb

## 2013-11-28 DIAGNOSIS — C163 Malignant neoplasm of pyloric antrum: Secondary | ICD-10-CM

## 2013-11-28 DIAGNOSIS — C169 Malignant neoplasm of stomach, unspecified: Secondary | ICD-10-CM

## 2013-11-28 DIAGNOSIS — Z5111 Encounter for antineoplastic chemotherapy: Secondary | ICD-10-CM

## 2013-11-28 DIAGNOSIS — D509 Iron deficiency anemia, unspecified: Secondary | ICD-10-CM

## 2013-11-28 DIAGNOSIS — R197 Diarrhea, unspecified: Secondary | ICD-10-CM

## 2013-11-28 LAB — CBC WITH DIFFERENTIAL/PLATELET
BASO%: 0.6 % (ref 0.0–2.0)
BASOS ABS: 0 10*3/uL (ref 0.0–0.1)
EOS%: 1.3 % (ref 0.0–7.0)
Eosinophils Absolute: 0 10*3/uL (ref 0.0–0.5)
HEMATOCRIT: 32.2 % — AB (ref 34.8–46.6)
HEMOGLOBIN: 10.4 g/dL — AB (ref 11.6–15.9)
LYMPH%: 45.2 % (ref 14.0–49.7)
MCH: 26.3 pg (ref 25.1–34.0)
MCHC: 32.3 g/dL (ref 31.5–36.0)
MCV: 81.3 fL (ref 79.5–101.0)
MONO#: 0.5 10*3/uL (ref 0.1–0.9)
MONO%: 15.7 % — ABNORMAL HIGH (ref 0.0–14.0)
NEUT#: 1.2 10*3/uL — ABNORMAL LOW (ref 1.5–6.5)
NEUT%: 37.2 % — AB (ref 38.4–76.8)
PLATELETS: 314 10*3/uL (ref 145–400)
RBC: 3.96 10*6/uL (ref 3.70–5.45)
RDW: 18.1 % — ABNORMAL HIGH (ref 11.2–14.5)
WBC: 3.1 10*3/uL — ABNORMAL LOW (ref 3.9–10.3)
lymph#: 1.4 10*3/uL (ref 0.9–3.3)

## 2013-11-28 MED ORDER — SODIUM CHLORIDE 0.9 % IV SOLN
Freq: Once | INTRAVENOUS | Status: AC
  Administered 2013-11-28: 11:00:00 via INTRAVENOUS

## 2013-11-28 MED ORDER — LEUCOVORIN CALCIUM INJECTION 350 MG
500.0000 mg/m2 | Freq: Once | INTRAMUSCULAR | Status: AC
  Administered 2013-11-28: 896 mg via INTRAVENOUS
  Filled 2013-11-28: qty 44.8

## 2013-11-28 MED ORDER — HEPARIN SOD (PORK) LOCK FLUSH 100 UNIT/ML IV SOLN
500.0000 [IU] | Freq: Once | INTRAVENOUS | Status: AC | PRN
  Administered 2013-11-28: 500 [IU]
  Filled 2013-11-28: qty 5

## 2013-11-28 MED ORDER — ONDANSETRON 8 MG/NS 50 ML IVPB
INTRAVENOUS | Status: AC
  Filled 2013-11-28: qty 8

## 2013-11-28 MED ORDER — ONDANSETRON 8 MG/50ML IVPB (CHCC)
8.0000 mg | Freq: Once | INTRAVENOUS | Status: AC
  Administered 2013-11-28: 8 mg via INTRAVENOUS

## 2013-11-28 MED ORDER — SODIUM CHLORIDE 0.9 % IJ SOLN
10.0000 mL | INTRAMUSCULAR | Status: DC | PRN
  Administered 2013-11-28: 10 mL
  Filled 2013-11-28: qty 10

## 2013-11-28 MED ORDER — OXYCODONE HCL 5 MG PO TABS: 5.0000 mg | ORAL_TABLET | Freq: Four times a day (QID) | ORAL | Status: DC | PRN

## 2013-11-28 MED ORDER — FLUOROURACIL CHEMO INJECTION 2.5 GM/50ML
600.0000 mg/m2 | Freq: Once | INTRAVENOUS | Status: AC
  Administered 2013-11-28: 1050 mg via INTRAVENOUS
  Filled 2013-11-28: qty 21

## 2013-11-28 NOTE — Progress Notes (Signed)
Andrea Best   Diagnosis:  Gastric cancer.  INTERVAL HISTORY:   Andrea Best returns for follow up prior to receiving her chemotherapy. Today is cycle 1 day 15 of 5FU and leucovorin according to the [facility] regimen. She feels well today. Abdominal wound is healing well. Has a very small amount of serous drainage on the dressing. Denies fevers. Pain is improving. Uses Oxycodone about once a day. Needs a refill on this today. Has had mild nausea with vomiting on 1-2 occasions. Uses Zofran which is helping. No mouth sores. Reports intermittent diarrhea Has not tried Imodium. Reports difficulty sleeping. Has tried Melatonin which is not working. She denies fever.   Objective:  Vital signs in last 24 hours:  Blood pressure 104/60, pulse 91, temperature 98.4 F (36.9 C), temperature source Oral, resp. rate 18, height 5\' 6"  (1.676 m), weight 148 lb 11.2 oz (67.45 kg), last menstrual period 11/14/2013, SpO2 100.00%.    HEENT: No thrush or ulcerations. Lymphatics: No cervical or supraclavicular adenopathy. Shotty bilateral lymph nodes palpated in the axillae. Resp: Lungs clear bilaterally. Cardio: Regular rate and rhythm. GI: Abdomen soft and nontender. No hepatomegaly. Mid to lower portion of the midline wound is open and packed. The wound edges appear clean. Vascular: No leg edema.   Lab Results:  Lab Results  Component Value Date   WBC 3.1* 11/28/2013   HGB 10.4* 11/28/2013   HCT 32.2* 11/28/2013   MCV 81.3 11/28/2013   PLT 314 11/28/2013   NEUTROABS 1.2* 11/28/2013    Imaging:  No results found.  Medications: I have reviewed the patient's current medications.  Assessment/Plan: 1. Gastric cancer status post upper endoscopy 09/22/2013 with findings of a partially obstructing oozing cratered gastric ulcer in the gastric antrum.  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  CT scans chest/abdomen/pelvis 10/03/2013  showed a 7 x 5 mm groundglass opacity in the superior segment of the right lower lobe; a 4.5 mm nodular density in the right upper lobe; bilateral axillary adenopathy; low density area anteriorly in the left hepatic lobe most consistent with fatty infiltration; another hypodense area within the medial segment of the left hepatic lobe; severe gastric distention; mild bilateral inguinal adenopathy. Subtotal gastrectomy and lymph node dissection with creation of a gastrojejunostomy 10/10/2013. No evidence of distant metastatic disease noted at the time of surgery. No evidence of liver metastases. 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 Tumor involved the serosal surface. Resection margins were negative. 10 of 17 lymph nodes were positive for metastatic adenocarcinoma. 2. Gastric outlet obstruction secondary to #1. Resolved. 3. Nausea/vomiting secondary to #2. Resolved. 4. Abdominal pain secondary to #1 and #2. Improved. 5. Weight loss secondary to #1 and #2. 6. Microcytic anemia, likely iron deficiency. She received iron dextran 10/11/2013. 7. Lupus/Sjgren's. 8. Pneumonia during hospitalization July 2015.  9. Abdominal abscesses with body fluid culture 10/20/2013 showing moderate Candida tropicalis. She was discharged home on a 4 week course of fluconazole.    Disposition: Andrea Best is tolerating chemotherapy moderately well. She is at high risk of developing recurrent gastric cancer. Dr. Benay Spice recommends adjuvant chemotherapy and radiation. She will receive adjuvant weekly 5-FU/leucovorin according to the "[place]" regimen for 2 months to be followed by concurrent capecitabine and radiation.   ANC is 1.2 today. She is afebrile and has no signs of infection. CBC reviewed with Dr. Alen Blew in Dr Gearldine Shown absence. She may proceed with chemo today. I have ordered Zofran as a pre-medication today  since she did not tolerate Compazine. She has Zofran to take at home as  well.    For her diarrhea, we discussed the use of Imodium and she was given written instructions on how to use this medication. She will let us know if this is not controlling her diarrhea.   For sleep, we discussed using OTC medications such as Benadryl or Unisom. If not effective, we can consider alternative treatments at her next visit.   The abdominal wound is healing. She will continue daily dressing changes and follow-up with Dr Excell Seltzer as scheduled.   Her pain is well-controlled. She may continue to use Oxycodone as needed. I have refilled this today.   Her anemia is significantly improved. She is taking a multivitamin and she will continue this.   She will return next week for chemotherapy and on 9/25 for cycle 2 day 1 of her chemo.   25 minutes were spent face-to-face at today's visit with the majority of that time involved in counseling/coordination of care.   Mikey Bussing, DNP, AGPCNP-BC 11/28/2013  12:09 PM

## 2013-11-28 NOTE — Progress Notes (Signed)
VO to treat today despite ANC of 1.2

## 2013-11-28 NOTE — Patient Instructions (Signed)
You may use Benadryl or Unisom over the counter for sleep.  Use Imodium 2 tablets after first loose stool and then 1 tab after each additional loose stool. Max of 8 tabs in a 24 hours period.

## 2013-11-28 NOTE — Patient Instructions (Signed)
[facility] Discharge Instructions for Patients Receiving Chemotherapy  Today you received the following chemotherapy agents Leucovorin and 5FU.  To help prevent nausea and vomiting after your treatment, we encourage you to take your nausea medication  Zofran as prescribed. NO COMPAZINE.    If you develop nausea and vomiting that is not controlled by your nausea medication, call the clinic.   BELOW ARE SYMPTOMS THAT SHOULD BE REPORTED IMMEDIATELY:  *FEVER GREATER THAN 100.5 F  *CHILLS WITH OR WITHOUT FEVER  NAUSEA AND VOMITING THAT IS NOT CONTROLLED WITH YOUR NAUSEA MEDICATION  *UNUSUAL SHORTNESS OF BREATH  *UNUSUAL BRUISING OR BLEEDING  TENDERNESS IN MOUTH AND THROAT WITH OR WITHOUT PRESENCE OF ULCERS  *URINARY PROBLEMS  *BOWEL PROBLEMS  UNUSUAL RASH Items with * indicate a potential emergency and should be followed up as soon as possible.  Feel free to call the clinic you have any questions or concerns. The clinic phone number is [phone].

## 2013-12-01 ENCOUNTER — Telehealth: Payer: Self-pay | Admitting: Oncology

## 2013-12-01 ENCOUNTER — Other Ambulatory Visit: Payer: Self-pay | Admitting: Oncology

## 2013-12-01 NOTE — Telephone Encounter (Signed)
Labs/ov per 09/04 POF, sent msg to add chemo, pt to get updated sch at next visit...Marland KitchenMarland KitchenKJ

## 2013-12-02 ENCOUNTER — Telehealth: Payer: Self-pay | Admitting: *Deleted

## 2013-12-02 NOTE — Telephone Encounter (Signed)
Per staff message and POF I have scheduled appts. Advised scheduler of appts. JMW  

## 2013-12-03 LAB — AFB CULTURE WITH SMEAR (NOT AT ARMC)
Acid Fast Smear: NONE SEEN
SPECIAL REQUESTS: NORMAL

## 2013-12-05 ENCOUNTER — Other Ambulatory Visit (HOSPITAL_BASED_OUTPATIENT_CLINIC_OR_DEPARTMENT_OTHER): Payer: 59

## 2013-12-05 ENCOUNTER — Other Ambulatory Visit: Payer: Self-pay | Admitting: Oncology

## 2013-12-05 ENCOUNTER — Ambulatory Visit (HOSPITAL_BASED_OUTPATIENT_CLINIC_OR_DEPARTMENT_OTHER): Payer: 59 | Admitting: Nurse Practitioner

## 2013-12-05 ENCOUNTER — Ambulatory Visit (HOSPITAL_BASED_OUTPATIENT_CLINIC_OR_DEPARTMENT_OTHER): Payer: 59

## 2013-12-05 ENCOUNTER — Ambulatory Visit: Payer: 59 | Admitting: Nutrition

## 2013-12-05 VITALS — BP 100/67 | HR 98 | Temp 97.9°F | Resp 16

## 2013-12-05 DIAGNOSIS — G620 Drug-induced polyneuropathy: Secondary | ICD-10-CM

## 2013-12-05 DIAGNOSIS — C169 Malignant neoplasm of stomach, unspecified: Secondary | ICD-10-CM

## 2013-12-05 DIAGNOSIS — G622 Polyneuropathy due to other toxic agents: Secondary | ICD-10-CM

## 2013-12-05 DIAGNOSIS — Z5111 Encounter for antineoplastic chemotherapy: Secondary | ICD-10-CM

## 2013-12-05 DIAGNOSIS — C163 Malignant neoplasm of pyloric antrum: Secondary | ICD-10-CM

## 2013-12-05 DIAGNOSIS — R630 Anorexia: Secondary | ICD-10-CM

## 2013-12-05 DIAGNOSIS — J069 Acute upper respiratory infection, unspecified: Secondary | ICD-10-CM

## 2013-12-05 DIAGNOSIS — L271 Localized skin eruption due to drugs and medicaments taken internally: Secondary | ICD-10-CM

## 2013-12-05 DIAGNOSIS — L270 Generalized skin eruption due to drugs and medicaments taken internally: Secondary | ICD-10-CM

## 2013-12-05 DIAGNOSIS — T451X5A Adverse effect of antineoplastic and immunosuppressive drugs, initial encounter: Secondary | ICD-10-CM

## 2013-12-05 LAB — CBC WITH DIFFERENTIAL/PLATELET
BASO%: 0.3 % (ref 0.0–2.0)
BASOS ABS: 0 10*3/uL (ref 0.0–0.1)
EOS%: 0.9 % (ref 0.0–7.0)
Eosinophils Absolute: 0 10*3/uL (ref 0.0–0.5)
HEMATOCRIT: 34.7 % — AB (ref 34.8–46.6)
HGB: 11.6 g/dL (ref 11.6–15.9)
LYMPH%: 48.3 % (ref 14.0–49.7)
MCH: 27.1 pg (ref 25.1–34.0)
MCHC: 33.4 g/dL (ref 31.5–36.0)
MCV: 81.1 fL (ref 79.5–101.0)
MONO#: 0.6 10*3/uL (ref 0.1–0.9)
MONO%: 17.1 % — AB (ref 0.0–14.0)
NEUT#: 1.1 10*3/uL — ABNORMAL LOW (ref 1.5–6.5)
NEUT%: 33.4 % — AB (ref 38.4–76.8)
Platelets: 298 10*3/uL (ref 145–400)
RBC: 4.28 10*6/uL (ref 3.70–5.45)
RDW: 18.2 % — AB (ref 11.2–14.5)
WBC: 3.2 10*3/uL — ABNORMAL LOW (ref 3.9–10.3)
lymph#: 1.6 10*3/uL (ref 0.9–3.3)

## 2013-12-05 MED ORDER — SODIUM CHLORIDE 0.9 % IV SOLN
500.0000 mL | Freq: Once | INTRAVENOUS | Status: AC
  Administered 2013-12-05: 500 mL via INTRAVENOUS

## 2013-12-05 MED ORDER — SODIUM CHLORIDE 0.9 % IJ SOLN
10.0000 mL | INTRAMUSCULAR | Status: DC | PRN
Start: 2013-12-05 — End: 2013-12-05
  Administered 2013-12-05: 10 mL
  Filled 2013-12-05: qty 10

## 2013-12-05 MED ORDER — LEUCOVORIN CALCIUM INJECTION 350 MG
300.0000 mg/m2 | Freq: Once | INTRAVENOUS | Status: AC
  Administered 2013-12-05: 538 mg via INTRAVENOUS
  Filled 2013-12-05: qty 26.9

## 2013-12-05 MED ORDER — ONDANSETRON 8 MG/50ML IVPB (CHCC)
8.0000 mg | Freq: Once | INTRAVENOUS | Status: AC
  Administered 2013-12-05: 8 mg via INTRAVENOUS

## 2013-12-05 MED ORDER — HEPARIN SOD (PORK) LOCK FLUSH 100 UNIT/ML IV SOLN
500.0000 [IU] | Freq: Once | INTRAVENOUS | Status: AC | PRN
  Administered 2013-12-05: 500 [IU]
  Filled 2013-12-05: qty 5

## 2013-12-05 MED ORDER — FLUOROURACIL CHEMO INJECTION 2.5 GM/50ML
300.0000 mg/m2 | Freq: Once | INTRAVENOUS | Status: AC
  Administered 2013-12-05: 550 mg via INTRAVENOUS
  Filled 2013-12-05: qty 11

## 2013-12-05 MED ORDER — SODIUM CHLORIDE 0.9 % IV SOLN
Freq: Once | INTRAVENOUS | Status: AC
  Administered 2013-12-05: 12:00:00 via INTRAVENOUS

## 2013-12-05 MED ORDER — ONDANSETRON 8 MG/NS 50 ML IVPB
INTRAVENOUS | Status: AC
  Filled 2013-12-05: qty 8

## 2013-12-05 NOTE — Progress Notes (Signed)
Spoke with Dr.Sherrill about pt's sore throat, runny nose, numbness and tingling in hands and feet, slight redness noted on the palms and soles of feet. Also reported no CMET drawn today and changes in CBC. Retta Mac, NP assessed pt. Per Dr. Benay Spice he will dose reduce and treat.  Per Retta Mac, hold flu shot and reevaluate in one week.

## 2013-12-05 NOTE — Patient Instructions (Signed)
[facility] Discharge Instructions for Patients Receiving Chemotherapy  Today you received the following chemotherapy agents Leucovorin and 5FU  To help prevent nausea and vomiting after your treatment, we encourage you to take your nausea medication as directed.   If you develop nausea and vomiting that is not controlled by your nausea medication, call the clinic.   BELOW ARE SYMPTOMS THAT SHOULD BE REPORTED IMMEDIATELY:  *FEVER GREATER THAN 100.5 F  *CHILLS WITH OR WITHOUT FEVER  NAUSEA AND VOMITING THAT IS NOT CONTROLLED WITH YOUR NAUSEA MEDICATION  *UNUSUAL SHORTNESS OF BREATH  *UNUSUAL BRUISING OR BLEEDING  TENDERNESS IN MOUTH AND THROAT WITH OR WITHOUT PRESENCE OF ULCERS  *URINARY PROBLEMS  *BOWEL PROBLEMS  UNUSUAL RASH Items with * indicate a potential emergency and should be followed up as soon as possible.  Feel free to call the clinic you have any questions or concerns. The clinic phone number is [phone].

## 2013-12-05 NOTE — Progress Notes (Signed)
43 year old female diagnosed with gastric cancer status post subtotal gastrectomy July 17.  She is a patient of Dr. Benay Spice.  Past medical history includes asthma, lupus. Sjogren's disease, esophagitis, and severe protein calorie malnutrition.  Medications include Diflucan, Prevacid, multivitamin, MiraLax, prednisone, Zofran, and Imodium.  Labs include albumin 2.9 pounds August 21.  Height: 66 inches. Weight: 145.4 pounds. Usual Body Weight: 180 pounds March 2015. BMI: 23.44.  Patient reports she has been vegetarian for about 7 years.  She does consume eggs and cheese.  She has not tolerated boost or ensure.  Complains of some nausea and vomiting which is improved with Zofran.  She has had diarrhea, but that has improved with Imodium.  She complains of fatigue.  She is concerned with progressive weight loss.  Her appetite is poor and she struggles to eat frequently.  Patient meets criteria for severe malnutrition in the context of chronic illness as evidenced by 19% weight loss over 6 months, less than 75% of estimated energy needs for greater than one month, depletion of body fat and muscle mass.  Nutrition diagnosis: Inadequate oral intake related to diagnosis of gastric cancer and associated side effects as evidenced by 35 pound weight loss from usual body weight.  Intervention: Patient educated to consume smaller, more frequent higher calorie, higher protein meals.  Recommended 6 meals/snacks daily. Educated patient on vegetarian protein sources.  Provided a fact sheet. Encouraged bland diet if experiencing nausea or diarrhea.  Provided fact sheets. Recommended patient try resource breeze and Orgain oral nutrition supplements.  Provided samples. Questions were answered and teach back method used.  Monitoring, evaluation, goals: Patient will tolerate increased calories and protein along with oral nutrition supplements to promote weight stabilization/Gain.  Next visit: Friday,  September 25, during chemotherapy.  **Disclaimer: This note was dictated with voice recognition software. Similar sounding words can inadvertently be transcribed and this note may contain transcription errors which may not have been corrected upon publication of note.**

## 2013-12-10 ENCOUNTER — Other Ambulatory Visit: Payer: Self-pay | Admitting: Internal Medicine

## 2013-12-14 ENCOUNTER — Other Ambulatory Visit: Payer: Self-pay | Admitting: Oncology

## 2013-12-15 ENCOUNTER — Encounter: Payer: Self-pay | Admitting: Nurse Practitioner

## 2013-12-15 DIAGNOSIS — R630 Anorexia: Secondary | ICD-10-CM | POA: Insufficient documentation

## 2013-12-15 DIAGNOSIS — L271 Localized skin eruption due to drugs and medicaments taken internally: Secondary | ICD-10-CM | POA: Insufficient documentation

## 2013-12-15 DIAGNOSIS — G620 Drug-induced polyneuropathy: Secondary | ICD-10-CM | POA: Insufficient documentation

## 2013-12-15 DIAGNOSIS — J069 Acute upper respiratory infection, unspecified: Secondary | ICD-10-CM | POA: Insufficient documentation

## 2013-12-15 DIAGNOSIS — T451X5A Adverse effect of antineoplastic and immunosuppressive drugs, initial encounter: Secondary | ICD-10-CM

## 2013-12-15 NOTE — Progress Notes (Signed)
[place]   Chief Complaint  Patient presents with  . URI    HPI: Andrea Best 43 y.o. female diagnosed with gastric cancer.  Patient currently undergoing 5-FU / Leucovorin chemotherapy per the [place] regimen.  Patient called the cancer Center today requesting urgent care visit.  Patient is complaining of very mild URI symptoms.  She denies any congested cough, chest pain/chest pressure, or shortness of breath.  She denies any recent fevers or chills.  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  She's also complaining of continued decreased appetite; has lost additional weight since her last weight check.    Patient states that her at surgical wound from her subtotal gastrectomy obtained on 10/10/2013 continues to heal; with no evidence of infection.  Patient has an appointment to follow up with a nutritionist today as well.  HPI  CURRENT THERAPY: Upcoming Treatment Dates - [place] 5FU / Leucovorin D [id number] [id number] Days with orders from any treatment category:  12/19/2013      ondansetron (ZOFRAN) IVPB 8 mg      SCHEDULING COMMUNICATION      leucovorin 538 mg in dextrose 5 % 250 mL infusion      fluorouracil (ADRUCIL) chemo injection 550 mg      sodium chloride 0.9 % injection 10 mL      heparin lock flush 100 unit/mL      heparin lock flush 100 unit/mL      alteplase (CATHFLO ACTIVASE) injection 2 mg      sodium chloride 0.9 % injection 3 mL      Cold Pack 1 packet      0.9 %  sodium chloride infusion      TREATMENT CONDITIONS 12/26/2013      ondansetron (ZOFRAN) IVPB 8 mg      SCHEDULING COMMUNICATION      leucovorin 538 mg in dextrose 5 % 250 mL infusion      fluorouracil (ADRUCIL) chemo injection 550 mg      sodium chloride 0.9 % injection 10 mL      heparin lock flush 100 unit/mL      heparin lock  flush 100 unit/mL      alteplase (CATHFLO ACTIVASE) injection 2 mg      sodium chloride 0.9 % injection 3 mL      Cold Pack 1 packet      0.9 %  sodium chloride infusion      TREATMENT CONDITIONS 01/02/2014      ondansetron (ZOFRAN) IVPB 8 mg      SCHEDULING COMMUNICATION      leucovorin 538 mg in dextrose 5 % 250 mL infusion      fluorouracil (ADRUCIL) chemo injection 550 mg      sodium chloride 0.9 % injection 10 mL      heparin lock flush 100 unit/mL      heparin lock flush 100 unit/mL      alteplase (CATHFLO ACTIVASE) injection 2 mg      sodium chloride 0.9 % injection 3 mL      Cold Pack 1 packet      0.9 %  sodium chloride infusion      TREATMENT CONDITIONS    ROS  Past Medical History  Diagnosis Date  . Eczema   . Lupus     joint pain  and extreme fatique - improved after plaquenil and prednisone therapy  . Sjogren's disease     caused pt to loose her teeth due to dry mouth;  prone to eye irritation and infection due to dry eye; affects immune system - PRONE TO INFECTIONS   . Allergy   . Tachycardia   . Protein-calorie malnutrition, severe   . Abnormal findings on esophagogastroduodenoscopy (EGD) 09/22/13    esophagitis  . Asthma     no recent flare ups - no inhalers - as of 11/19/13  . GERD (gastroesophageal reflux disease)   . Anemia     iron deficiency   . Pneumonia     health care -associated July 2015 post op gastrectomy surgery  . 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    Past Surgical History  Procedure Laterality Date  . Cesarean section      1997  . Laparotomy N/A 10/10/2013    Procedure: SUBTOTAL GASTRECTOMY WITH GASTRIC JEJUNOSOTMY;  Surgeon: Edward Jolly, MD;  Location: [facility];  Service: General;  Laterality: N/A;  . Portacath placement Right 11/20/2013    Procedure: INSERTION PORT-A-CATH with ULTRA SOUND;  Surgeon: Adin Hector, MD;  Location: [facility];   Service: General;  Laterality: Right;    has Joint pain; Stomach pain; Gastric cancer; Gastric outlet obstruction; Anemia, iron deficiency; Protein-calorie malnutrition, severe; Hypomagnesemia; Hyperphosphatemia; Fever, unspecified; Tachycardia; Leucocytosis; HCAP (healthcare-associated pneumonia); Candida tropicalis infection; HAP (hospital-acquired pneumonia); Cancer of antrum of stomach; URI (upper respiratory infection); Hand foot syndrome; Neuropathy due to chemotherapeutic drug; and Anorexia on her problem list.     is allergic to compazine; azithromycin; sulfa antibiotics; hydrocodone-acetaminophen; and other.    Medication List       This list is accurate as of: 12/05/13 11:59 PM.  Always use your most recent med list.               BEPREVE 1.5 % Soln  Generic drug:  Bepotastine Besilate  Place 1 drop into both eyes daily with breakfast.     doxycycline 100 MG DR capsule  Commonly known as:  DORYX  Take 100 mg by mouth 2 (two) times daily. For 7 days     fluconazole 100 MG tablet  Commonly known as:  DIFLUCAN  Take 1 tablet (100 mg total) by mouth daily.     hydroxychloroquine 200 MG tablet  Commonly known as:  PLAQUENIL  Take 200 mg by mouth 2 (two) times daily.     lactose free nutrition Liqd  Take 237 mLs by mouth 2 (two) times daily between meals.     lansoprazole 15 MG disintegrating tablet  Commonly known as:  PREVACID SOLUTAB  Take 15 mg by mouth 2 (two) times daily before a meal.     lidocaine-prilocaine cream  Commonly known as:  EMLA  Appy small amount of cream over port site 1-2 hours prior to treatment and cover with plastic wrap.  DO NOT RUB IN     Melatonin 1 MG Tabs  Take 5 mg by mouth at bedtime.     multivitamin with minerals Tabs tablet  Take 1 tablet by mouth daily.     ondansetron 8 MG disintegrating tablet  Commonly known as:  ZOFRAN-ODT  Take 1 tablet (8 mg total) by mouth every 8 (eight) hours as needed for nausea or vomiting.      oxyCODONE 5 MG immediate release tablet  Commonly  known as:  Oxy IR/ROXICODONE  Take 1-2 tablets (5-10 mg total) by mouth every 6 (six) hours as needed for severe pain.     polyethylene glycol packet  Commonly known as:  MIRALAX / GLYCOLAX  Take 17 g by mouth daily as needed for mild constipation.     predniSONE 5 MG tablet  Commonly known as:  DELTASONE  Take 5 mg by mouth 2 (two) times daily with a meal.     PRESCRIPTION MEDICATION  fluorouracil (ADRUCIL) chemo injection 1,050 mg 600 mg/m2  1.79 m2 (Treatment Plan Actual)  Once         PHYSICAL EXAMINATION  Blood pressure , last menstrual period 11/14/2013.  Vitals: 100/67, HR 98, temp 97.9, o2 sat 100  Physical Exam  LABORATORY DATA:. CBC  Lab Results  Component Value Date   WBC 3.2* 12/05/2013   RBC 4.28 12/05/2013   HGB 11.6 12/05/2013   HCT 34.7* 12/05/2013   PLT 298 12/05/2013   MCV 81.1 12/05/2013   MCH 27.1 12/05/2013   MCHC 33.4 12/05/2013   RDW 18.2* 12/05/2013   LYMPHSABS 1.6 12/05/2013   MONOABS 0.6 12/05/2013   EOSABS 0.0 12/05/2013   BASOSABS 0.0 12/05/2013     CMET  Lab Results  Component Value Date   NA 137 11/20/2013   K 4.1 11/20/2013   CL 101 11/20/2013   CO2 22 11/20/2013   GLUCOSE 94 11/20/2013   BUN 7 11/20/2013   CREATININE 0.52 11/20/2013   CALCIUM 9.3 11/20/2013   PROT 8.2 11/20/2013   ALBUMIN 3.1* 11/20/2013   AST 24 11/20/2013   ALT 16 11/20/2013   ALKPHOS 87 11/20/2013   BILITOT 0.2* 11/20/2013   GFRNONAA >90 11/20/2013   GFRAA >90 11/20/2013   ASSESSMENT/PLAN:    Gastric cancer  Assessment & Plan Patient to receive cycle 1, day 22 of 5-FU / Leucovorin and chemotherapy regimen at a reduced dose due to continued neutropenia with an ANC of 1.1.  Patient has plans to return for cycle 2 of the same regimen on    URI (upper respiratory infection)  Assessment & Plan The patient appears to have mild upper respiratory type infection symptoms.  No evidence of sinusitis on exam.  Patient was  afebrile.  No productive cough. Patient was encouraged to use saline nasal spray when her nose becomes stuffy.   Hand foot syndrome  Assessment & Plan Chemotherapy-induced hand/foot syndrome.  The palms of patient's hands and the soles of patient's feet noted to have mild erythema.  No peeling or cracking or changing of nails.  Patient was encouraged to keep hands and feet very well moisturized; and to protect from extremes in temperature.   Neuropathy due to chemotherapeutic drug  Assessment & Plan Does appear the patient has a very mild neuropathy to both her hands or feet is a chemotherapy side effect.   Anorexia  Assessment & Plan Patient is complaining of continued decreased appetite; has lost another 3 pounds since her last weight check.  Patient was encouraged to eat multiple small meals throughout the day.  Also, patient did meet with the nutritionist today.   Patient stated understanding of all instructions; and was in agreement with this plan of care. The patient knows to call the clinic with any problems, questions or concerns.   Review/collaboration with Dr. Benay Spice regarding all aspects of patient's visit today.   Total time spent with patient was 25 minutes;  with greater than 75 percent of that time spent in  face to face counseling regarding her symptoms, and coordination of care and follow up.  Disclaimer: This note was dictated with voice recognition software. Similar sounding words can inadvertently be transcribed and may not be corrected upon review.   Drue Second, NP 12/15/2013

## 2013-12-15 NOTE — Assessment & Plan Note (Signed)
Chemotherapy-induced hand/foot syndrome.  The palms of patient's hands and the soles of patient's feet noted to have mild erythema.  No peeling or cracking or changing of nails.  Patient was encouraged to keep hands and feet very well moisturized; and to protect from extremes in temperature.

## 2013-12-15 NOTE — Assessment & Plan Note (Signed)
Patient to receive cycle 1, day 22 of 5-FU / Leucovorin and chemotherapy regimen at a reduced dose due to continued neutropenia with an ANC of 1.1.  Patient has plans to return for cycle 2 of the same regimen on

## 2013-12-15 NOTE — Assessment & Plan Note (Signed)
Does appear the patient has a very mild neuropathy to both her hands or feet is a chemotherapy side effect.

## 2013-12-15 NOTE — Assessment & Plan Note (Signed)
The patient appears to have mild upper respiratory type infection symptoms.  No evidence of sinusitis on exam.  Patient was afebrile.  No productive cough. Patient was encouraged to use saline nasal spray when her nose becomes stuffy.

## 2013-12-15 NOTE — Assessment & Plan Note (Signed)
Patient is complaining of continued decreased appetite; has lost another 3 pounds since her last weight check.  Patient was encouraged to eat multiple small meals throughout the day.  Also, patient did meet with the nutritionist today.

## 2013-12-17 ENCOUNTER — Encounter (INDEPENDENT_AMBULATORY_CARE_PROVIDER_SITE_OTHER): Payer: 59 | Admitting: General Surgery

## 2013-12-18 ENCOUNTER — Other Ambulatory Visit (HOSPITAL_BASED_OUTPATIENT_CLINIC_OR_DEPARTMENT_OTHER): Payer: 59

## 2013-12-18 ENCOUNTER — Ambulatory Visit (HOSPITAL_BASED_OUTPATIENT_CLINIC_OR_DEPARTMENT_OTHER): Payer: 59 | Admitting: Oncology

## 2013-12-18 VITALS — BP 92/57 | HR 77 | Temp 98.0°F | Resp 20 | Ht 66.0 in | Wt 150.4 lb

## 2013-12-18 DIAGNOSIS — D509 Iron deficiency anemia, unspecified: Secondary | ICD-10-CM

## 2013-12-18 DIAGNOSIS — C163 Malignant neoplasm of pyloric antrum: Secondary | ICD-10-CM

## 2013-12-18 DIAGNOSIS — R634 Abnormal weight loss: Secondary | ICD-10-CM | POA: Diagnosis not present

## 2013-12-18 DIAGNOSIS — G893 Neoplasm related pain (acute) (chronic): Secondary | ICD-10-CM

## 2013-12-18 DIAGNOSIS — C169 Malignant neoplasm of stomach, unspecified: Secondary | ICD-10-CM

## 2013-12-18 DIAGNOSIS — G622 Polyneuropathy due to other toxic agents: Secondary | ICD-10-CM

## 2013-12-18 LAB — CBC WITH DIFFERENTIAL/PLATELET
BASO%: 0.3 % (ref 0.0–2.0)
Basophils Absolute: 0 10*3/uL (ref 0.0–0.1)
EOS%: 0.5 % (ref 0.0–7.0)
Eosinophils Absolute: 0 10*3/uL (ref 0.0–0.5)
HEMATOCRIT: 34.4 % — AB (ref 34.8–46.6)
HGB: 11.1 g/dL — ABNORMAL LOW (ref 11.6–15.9)
LYMPH%: 36.5 % (ref 14.0–49.7)
MCH: 27.4 pg (ref 25.1–34.0)
MCHC: 32.3 g/dL (ref 31.5–36.0)
MCV: 84.9 fL (ref 79.5–101.0)
MONO#: 1.1 10*3/uL — ABNORMAL HIGH (ref 0.1–0.9)
MONO%: 29.1 % — ABNORMAL HIGH (ref 0.0–14.0)
NEUT#: 1.3 10*3/uL — ABNORMAL LOW (ref 1.5–6.5)
NEUT%: 33.6 % — AB (ref 38.4–76.8)
PLATELETS: 165 10*3/uL (ref 145–400)
RBC: 4.05 10*6/uL (ref 3.70–5.45)
RDW: 19.8 % — ABNORMAL HIGH (ref 11.2–14.5)
WBC: 3.8 10*3/uL — ABNORMAL LOW (ref 3.9–10.3)
lymph#: 1.4 10*3/uL (ref 0.9–3.3)

## 2013-12-18 NOTE — Progress Notes (Addendum)
[place] OFFICE PROGRESS NOTE   Diagnosis: Gastric cancer  INTERVAL HISTORY:   She returns as scheduled. She completed 4 weeks of 5-FU/leucovorin beginning 11/14/2013. She reports diarrhea following week 3. This has resolved. She was evaluated 12/15/2013 with evidence of an upper respiratory infection. She developed esophagitis and hand/foot pain following week 3. The 5-FU and leucovorin were dose reduced with week 4. She does not have hand/foot pain, or esophagitis symptoms at present.  Objective:  Vital signs in last 24 hours:  Blood pressure 92/57, pulse 77, temperature 98 F (36.7 C), temperature source Oral, resp. rate 20, height 5\' 6"  (1.676 m), weight 150 lb 6.4 oz (68.221 kg), last menstrual period 11/14/2013.    HEENT: Mild whitecoat over the tongue, no buccal thrush, no ulcers. Pharynx without erythema Resp: Lungs clear bilaterally Cardio: Regular rate and rhythm GI: No hepatomegaly, nontender, the abdominal wound has healed Vascular: No leg edema  Skin: Mild hyperpigmentation with dryness of the hands and feet. Mild superficial desquamation over the hands   Portacath/PICC-without erythema  Lab Results:  Lab Results  Component Value Date   WBC 3.8* 12/18/2013   HGB 11.1* 12/18/2013   HCT 34.4* 12/18/2013   MCV 84.9 12/18/2013   PLT 165 12/18/2013   NEUTROABS 1.3* 12/18/2013    Imaging:  No results found.  Medications: I have reviewed the patient's current medications.  Assessment/Plan: 1. Gastric cancer status post upper endoscopy 09/22/2013 with findings of a partially obstructing oozing cratered gastric ulcer in the gastric antrum.  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  CT scans chest/abdomen/pelvis 10/03/2013 showed a 7 x 5 mm groundglass opacity in the superior segment of the right lower lobe; a 4.5 mm nodular density in the right upper lobe; bilateral axillary adenopathy; low density area anteriorly  in the left hepatic lobe most consistent with fatty infiltration; another hypodense area within the medial segment of the left hepatic lobe; severe gastric distention; mild bilateral inguinal adenopathy.  Subtotal gastrectomy and lymph node dissection with creation of a gastrojejunostomy 10/10/2013. No evidence of distant metastatic disease noted at the time of surgery. No evidence of liver metastases. 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 Tumor involved the serosal surface. Resection margins were negative. 10 of 17 lymph nodes were positive for metastatic adenocarcinoma. Cycle 1 weekly 5-FU/leucovorin 11/14/2013, dose reduced beginning with week 3 secondary to mucositis and hand/foot syndrome Cycle 2 weekly 5-FU/leucovorin beginning 12/19/2048 2. Gastric outlet obstruction secondary to #1. Resolved. 3. Nausea/vomiting secondary to #2. Resolved. 4. Abdominal pain secondary to #1 and #2. Improved. 5. Weight loss secondary to #1 and #2. 6. Microcytic anemia, likely iron deficiency. She received iron dextran 10/11/2013. Improved. 7. Lupus/Sjgren's. 8. Pneumonia during hospitalization July 2015.  9. Abdominal abscesses with body fluid culture 10/20/2013 showing moderate Candida tropicalis. She was discharged home on a 4 week course of fluconazole.  10. Mucositis and hand/foot syndrome following cycle 1-week #3 5-FU/leucovorin, the 5-FU and leucovorin were dose reduced 11. Mild neutropenia-likely a benign normal variant or autoimmune neutropenia complicated by chemotherapy   Disposition:  She has completed one cycle of adjuvant 5-FU/leucovorin. She tolerated the chemotherapy well aside from mucositis and hand/foot syndrome. These symptoms improved when the chemotherapy was dose reduced.  The plan is to proceed with cycle 2 12/19/2013. She will return for an office visit 01/02/2014.  The plan is to complete radiation with concurrent 5-FU at the completion of cycle  2 5-FU/leucovorin.  Betsy Coder, MD  12/18/2013  4:09 PM

## 2013-12-18 NOTE — Progress Notes (Signed)
Declines flu vaccine today.

## 2013-12-19 ENCOUNTER — Ambulatory Visit (HOSPITAL_BASED_OUTPATIENT_CLINIC_OR_DEPARTMENT_OTHER): Payer: 59

## 2013-12-19 ENCOUNTER — Telehealth: Payer: Self-pay | Admitting: *Deleted

## 2013-12-19 ENCOUNTER — Ambulatory Visit: Payer: 59 | Admitting: Nutrition

## 2013-12-19 VITALS — BP 94/49 | HR 79 | Temp 98.5°F | Resp 18

## 2013-12-19 DIAGNOSIS — C169 Malignant neoplasm of stomach, unspecified: Secondary | ICD-10-CM

## 2013-12-19 DIAGNOSIS — Z5111 Encounter for antineoplastic chemotherapy: Secondary | ICD-10-CM | POA: Diagnosis not present

## 2013-12-19 DIAGNOSIS — C163 Malignant neoplasm of pyloric antrum: Secondary | ICD-10-CM | POA: Diagnosis not present

## 2013-12-19 MED ORDER — ONDANSETRON 8 MG/50ML IVPB (CHCC)
8.0000 mg | Freq: Once | INTRAVENOUS | Status: AC
  Administered 2013-12-19: 8 mg via INTRAVENOUS

## 2013-12-19 MED ORDER — DEXTROSE 5 % IV SOLN
300.0000 mg/m2 | Freq: Once | INTRAVENOUS | Status: AC
  Administered 2013-12-19: 538 mg via INTRAVENOUS
  Filled 2013-12-19: qty 26.9

## 2013-12-19 MED ORDER — SODIUM CHLORIDE 0.9 % IV SOLN
Freq: Once | INTRAVENOUS | Status: AC
  Administered 2013-12-19: 09:00:00 via INTRAVENOUS

## 2013-12-19 MED ORDER — HEPARIN SOD (PORK) LOCK FLUSH 100 UNIT/ML IV SOLN
500.0000 [IU] | Freq: Once | INTRAVENOUS | Status: AC | PRN
  Administered 2013-12-19: 500 [IU]
  Filled 2013-12-19: qty 5

## 2013-12-19 MED ORDER — SODIUM CHLORIDE 0.9 % IJ SOLN
10.0000 mL | INTRAMUSCULAR | Status: DC | PRN
  Administered 2013-12-19: 10 mL
  Filled 2013-12-19: qty 10

## 2013-12-19 MED ORDER — FLUOROURACIL CHEMO INJECTION 2.5 GM/50ML
300.0000 mg/m2 | Freq: Once | INTRAVENOUS | Status: AC
  Administered 2013-12-19: 550 mg via INTRAVENOUS
  Filled 2013-12-19: qty 11

## 2013-12-19 MED ORDER — ONDANSETRON 8 MG/NS 50 ML IVPB
INTRAVENOUS | Status: AC
  Filled 2013-12-19: qty 8

## 2013-12-19 NOTE — Telephone Encounter (Signed)
Per staff message I have adjusted 10/9 appt

## 2013-12-19 NOTE — Progress Notes (Signed)
Nutrition followup completed with patient during chemotherapy.  Weight increased 5 pounds and documented as 150.4 pounds September 24 from 145.4 pounds September 11.  Patient states she is eating more.  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  Nutrition diagnosis: Inadequate oral intake improved.  Intervention: Patient educated to continue smaller, more frequent meals and snacks. Encouraged high protein foods. Educated patient on signs and symptoms of dumping syndrome and strategies for eating to avoid.   Questions were answered and teach back method used.  Monitoring, evaluation, goals: Patient will continue to tolerate increased oral intake to minimize weight loss.  Next visit: Friday, October 9, during chemotherapy.  **Disclaimer: This note was dictated with voice recognition software. Similar sounding words can inadvertently be transcribed and this note may contain transcription errors which may not have been corrected upon publication of note.**

## 2013-12-19 NOTE — Patient Instructions (Signed)
[facility] Discharge Instructions for Patients Receiving Chemotherapy  Today you received the following chemotherapy agents: Leucovorin, 5FU  To help prevent nausea and vomiting after your treatment, we encourage you to take your nausea medication as prescribed by your physician.   If you develop nausea and vomiting that is not controlled by your nausea medication, call the clinic.   BELOW ARE SYMPTOMS THAT SHOULD BE REPORTED IMMEDIATELY:  *FEVER GREATER THAN 100.5 F  *CHILLS WITH OR WITHOUT FEVER  NAUSEA AND VOMITING THAT IS NOT CONTROLLED WITH YOUR NAUSEA MEDICATION  *UNUSUAL SHORTNESS OF BREATH  *UNUSUAL BRUISING OR BLEEDING  TENDERNESS IN MOUTH AND THROAT WITH OR WITHOUT PRESENCE OF ULCERS  *URINARY PROBLEMS  *BOWEL PROBLEMS  UNUSUAL RASH Items with * indicate a potential emergency and should be followed up as soon as possible.  Feel free to call the clinic you have any questions or concerns. The clinic phone number is [phone].    

## 2013-12-21 ENCOUNTER — Other Ambulatory Visit: Payer: Self-pay | Admitting: Oncology

## 2013-12-26 ENCOUNTER — Ambulatory Visit (HOSPITAL_BASED_OUTPATIENT_CLINIC_OR_DEPARTMENT_OTHER): Payer: 59 | Admitting: Nurse Practitioner

## 2013-12-26 ENCOUNTER — Ambulatory Visit (HOSPITAL_BASED_OUTPATIENT_CLINIC_OR_DEPARTMENT_OTHER): Payer: 59

## 2013-12-26 ENCOUNTER — Other Ambulatory Visit (HOSPITAL_BASED_OUTPATIENT_CLINIC_OR_DEPARTMENT_OTHER): Payer: 59

## 2013-12-26 VITALS — BP 108/60 | HR 107 | Temp 98.4°F | Resp 18 | Ht 66.0 in | Wt 152.2 lb

## 2013-12-26 DIAGNOSIS — G893 Neoplasm related pain (acute) (chronic): Secondary | ICD-10-CM | POA: Diagnosis not present

## 2013-12-26 DIAGNOSIS — D509 Iron deficiency anemia, unspecified: Secondary | ICD-10-CM

## 2013-12-26 DIAGNOSIS — C163 Malignant neoplasm of pyloric antrum: Secondary | ICD-10-CM

## 2013-12-26 DIAGNOSIS — C169 Malignant neoplasm of stomach, unspecified: Secondary | ICD-10-CM

## 2013-12-26 DIAGNOSIS — R634 Abnormal weight loss: Secondary | ICD-10-CM

## 2013-12-26 DIAGNOSIS — Z5111 Encounter for antineoplastic chemotherapy: Secondary | ICD-10-CM

## 2013-12-26 DIAGNOSIS — L271 Localized skin eruption due to drugs and medicaments taken internally: Secondary | ICD-10-CM

## 2013-12-26 LAB — CBC WITH DIFFERENTIAL/PLATELET
BASO%: 0.3 % (ref 0.0–2.0)
Basophils Absolute: 0 10*3/uL (ref 0.0–0.1)
EOS%: 1.1 % (ref 0.0–7.0)
Eosinophils Absolute: 0 10*3/uL (ref 0.0–0.5)
HCT: 36.5 % (ref 34.8–46.6)
HEMOGLOBIN: 12.2 g/dL (ref 11.6–15.9)
LYMPH%: 45.1 % (ref 14.0–49.7)
MCH: 28.4 pg (ref 25.1–34.0)
MCHC: 33.4 g/dL (ref 31.5–36.0)
MCV: 84.9 fL (ref 79.5–101.0)
MONO#: 0.6 10*3/uL (ref 0.1–0.9)
MONO%: 16.5 % — ABNORMAL HIGH (ref 0.0–14.0)
NEUT%: 37 % — ABNORMAL LOW (ref 38.4–76.8)
NEUTROS ABS: 1.3 10*3/uL — AB (ref 1.5–6.5)
NRBC: 0 % (ref 0–0)
Platelets: 215 10*3/uL (ref 145–400)
RBC: 4.3 10*6/uL (ref 3.70–5.45)
RDW: 19.9 % — ABNORMAL HIGH (ref 11.2–14.5)
WBC: 3.6 10*3/uL — ABNORMAL LOW (ref 3.9–10.3)
lymph#: 1.6 10*3/uL (ref 0.9–3.3)

## 2013-12-26 MED ORDER — FLUOROURACIL CHEMO INJECTION 2.5 GM/50ML
300.0000 mg/m2 | Freq: Once | INTRAVENOUS | Status: AC
  Administered 2013-12-26: 550 mg via INTRAVENOUS
  Filled 2013-12-26: qty 11

## 2013-12-26 MED ORDER — SODIUM CHLORIDE 0.9 % IV SOLN
Freq: Once | INTRAVENOUS | Status: AC
  Administered 2013-12-26: 14:00:00 via INTRAVENOUS

## 2013-12-26 MED ORDER — ONDANSETRON 8 MG/NS 50 ML IVPB
INTRAVENOUS | Status: AC
  Filled 2013-12-26: qty 8

## 2013-12-26 MED ORDER — LEUCOVORIN CALCIUM INJECTION 350 MG
300.0000 mg/m2 | Freq: Once | INTRAVENOUS | Status: AC
  Administered 2013-12-26: 538 mg via INTRAVENOUS
  Filled 2013-12-26: qty 26.9

## 2013-12-26 MED ORDER — ONDANSETRON 8 MG/50ML IVPB (CHCC)
8.0000 mg | Freq: Once | INTRAVENOUS | Status: AC
  Administered 2013-12-26: 8 mg via INTRAVENOUS

## 2013-12-26 MED ORDER — HEPARIN SOD (PORK) LOCK FLUSH 100 UNIT/ML IV SOLN
500.0000 [IU] | Freq: Once | INTRAVENOUS | Status: AC
  Administered 2013-12-26: 500 [IU] via INTRAVENOUS
  Filled 2013-12-26: qty 5

## 2013-12-26 MED ORDER — SODIUM CHLORIDE 0.9 % IJ SOLN
10.0000 mL | INTRAMUSCULAR | Status: DC | PRN
  Administered 2013-12-26: 10 mL via INTRAVENOUS
  Filled 2013-12-26: qty 10

## 2013-12-26 NOTE — Patient Instructions (Signed)
[facility] Discharge Instructions for Patients Receiving Chemotherapy  Today you received the following chemotherapy agents: Leucovorin/5FU  To help prevent nausea and vomiting after your treatment, we encourage you to take your nausea medication as prescribed by your physician.   If you develop nausea and vomiting that is not controlled by your nausea medication, call the clinic.   BELOW ARE SYMPTOMS THAT SHOULD BE REPORTED IMMEDIATELY:  *FEVER GREATER THAN 100.5 F  *CHILLS WITH OR WITHOUT FEVER  NAUSEA AND VOMITING THAT IS NOT CONTROLLED WITH YOUR NAUSEA MEDICATION  *UNUSUAL SHORTNESS OF BREATH  *UNUSUAL BRUISING OR BLEEDING  TENDERNESS IN MOUTH AND THROAT WITH OR WITHOUT PRESENCE OF ULCERS  *URINARY PROBLEMS  *BOWEL PROBLEMS  UNUSUAL RASH Items with * indicate a potential emergency and should be followed up as soon as possible.  Feel free to call the clinic you have any questions or concerns. The clinic phone number is [phone].

## 2013-12-26 NOTE — Progress Notes (Signed)
[place] OFFICE PROGRESS NOTE   Diagnosis:  Gastric cancer  INTERVAL HISTORY:   She returns as scheduled. She completed cycle 2 week 1 5-FU/leucovorin 12/19/2013. She notes that she is more fatigued for 2-3 days after the chemotherapy. She denies nausea/vomiting. No mouth sores. No dysphagia or odynophagia. She notes the palms and soles are mildly red. She denies any associated pain or skin breakdown. Appetite is improving. She is eating small frequent meals. She has gained some weight.  Objective:  Vital signs in last 24 hours:  Blood pressure 108/60, pulse 107, temperature 98.4 F (36.9 C), temperature source Oral, resp. rate 18, height 5\' 6"  (1.676 m), weight 152 lb 3.2 oz (69.037 kg), SpO2 100.00%.    HEENT: No thrush or ulcers. Resp: Lungs clear bilaterally. Cardio: Regular rate and rhythm. GI: Abdomen soft and nontender. No hepatomegaly. Abdominal midline wound has healed. Vascular: No leg edema.  Skin: Palms and soles with mild erythema and hyperpigmentation. No skin breakdown.  Port-A-Cath without erythema.    Lab Results:  Lab Results  Component Value Date   WBC 3.6* 12/26/2013   HGB 12.2 12/26/2013   HCT 36.5 12/26/2013   MCV 84.9 12/26/2013   PLT 215 12/26/2013   NEUTROABS 1.3* 12/26/2013    Imaging:  No results found.  Medications: I have reviewed the patient's current medications.  Assessment/Plan: 1. Gastric cancer status post upper endoscopy 09/22/2013 with findings of a partially obstructing oozing cratered gastric ulcer in the gastric antrum.  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  CT scans chest/abdomen/pelvis 10/03/2013 showed a 7 x 5 mm groundglass opacity in the superior segment of the right lower lobe; a 4.5 mm nodular density in the right upper lobe; bilateral axillary adenopathy; low density area anteriorly in the left hepatic lobe most consistent with fatty infiltration; another hypodense  area within the medial segment of the left hepatic lobe; severe gastric distention; mild bilateral inguinal adenopathy.  Subtotal gastrectomy and lymph node dissection with creation of a gastrojejunostomy 10/10/2013. No evidence of distant metastatic disease noted at the time of surgery. No evidence of liver metastases. 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 Tumor involved the serosal surface. Resection margins were negative. 10 of 17 lymph nodes were positive for metastatic adenocarcinoma.  Cycle 1 weekly 5-FU/leucovorin 11/14/2013, dose reduced beginning with week 3 secondary to mucositis and hand/foot syndrome.  Cycle 2 weekly 5-FU/leucovorin beginning 12/19/2013. 2. Gastric outlet obstruction secondary to #1. Resolved. 3. Nausea/vomiting secondary to #2. Resolved. 4. Abdominal pain secondary to #1 and #2. Improved. 5. Weight loss secondary to #1 and #2. 6. Microcytic anemia, likely iron deficiency. She received iron dextran 10/11/2013. Improved. 7. Lupus/Sjgren's. 8. Pneumonia during hospitalization July 2015.  9. Abdominal abscesses with body fluid culture 10/20/2013 showing moderate Candida tropicalis. She was discharged home on a 4 week course of fluconazole.  10. Mucositis and hand/foot syndrome following cycle 1-week #3 5-FU/leucovorin, the 5-FU and leucovorin were dose reduced. 11. 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 12. Status post LEEP 11/07/2013. Pathology shows moderate to severe dysplasia.   Disposition: She appears stable. Plan to proceed with cycle 2 week 2 5-FU/leucovorin today as scheduled.  She has mild hand-foot syndrome with erythema of the palms and soles. She will return for a followup visit in one week to reevaluate prior to proceeding with cycle 2 week 3.  She will contact the office prior to her next visit with any problems.  Plan reviewed with Dr.  Benay Spice.  Ned Card ANP/GNP-BC   12/26/2013  12:43 PM

## 2013-12-26 NOTE — Progress Notes (Signed)
Patient refusing flu shot this season. 

## 2013-12-28 ENCOUNTER — Other Ambulatory Visit: Payer: Self-pay | Admitting: Oncology

## 2013-12-30 ENCOUNTER — Telehealth: Payer: Self-pay | Admitting: Oncology

## 2013-12-30 NOTE — Telephone Encounter (Signed)
Spk w/pt confirming labs/ov per 10/02 POF, pt will p/u updated sch on next visit..... KJ

## 2014-01-02 ENCOUNTER — Other Ambulatory Visit: Payer: 59

## 2014-01-02 ENCOUNTER — Ambulatory Visit (HOSPITAL_BASED_OUTPATIENT_CLINIC_OR_DEPARTMENT_OTHER): Payer: 59

## 2014-01-02 ENCOUNTER — Other Ambulatory Visit (HOSPITAL_BASED_OUTPATIENT_CLINIC_OR_DEPARTMENT_OTHER): Payer: 59

## 2014-01-02 ENCOUNTER — Encounter: Payer: 59 | Admitting: Nutrition

## 2014-01-02 ENCOUNTER — Ambulatory Visit: Payer: 59 | Admitting: Oncology

## 2014-01-02 ENCOUNTER — Ambulatory Visit (HOSPITAL_BASED_OUTPATIENT_CLINIC_OR_DEPARTMENT_OTHER): Payer: 59 | Admitting: Oncology

## 2014-01-02 ENCOUNTER — Encounter: Payer: Self-pay | Admitting: Oncology

## 2014-01-02 ENCOUNTER — Ambulatory Visit: Payer: 59 | Admitting: Nutrition

## 2014-01-02 VITALS — BP 117/59 | HR 93 | Temp 98.0°F | Resp 18 | Ht 66.0 in | Wt 152.0 lb

## 2014-01-02 DIAGNOSIS — C169 Malignant neoplasm of stomach, unspecified: Secondary | ICD-10-CM

## 2014-01-02 DIAGNOSIS — L271 Localized skin eruption due to drugs and medicaments taken internally: Secondary | ICD-10-CM

## 2014-01-02 DIAGNOSIS — D509 Iron deficiency anemia, unspecified: Secondary | ICD-10-CM

## 2014-01-02 DIAGNOSIS — Z5111 Encounter for antineoplastic chemotherapy: Secondary | ICD-10-CM | POA: Diagnosis not present

## 2014-01-02 DIAGNOSIS — C163 Malignant neoplasm of pyloric antrum: Secondary | ICD-10-CM

## 2014-01-02 DIAGNOSIS — R634 Abnormal weight loss: Secondary | ICD-10-CM | POA: Diagnosis not present

## 2014-01-02 DIAGNOSIS — G893 Neoplasm related pain (acute) (chronic): Secondary | ICD-10-CM | POA: Diagnosis not present

## 2014-01-02 LAB — CBC WITH DIFFERENTIAL/PLATELET
BASO%: 0.3 % (ref 0.0–2.0)
BASOS ABS: 0 10*3/uL (ref 0.0–0.1)
EOS%: 0.9 % (ref 0.0–7.0)
Eosinophils Absolute: 0 10*3/uL (ref 0.0–0.5)
HCT: 34.3 % — ABNORMAL LOW (ref 34.8–46.6)
HEMOGLOBIN: 11.3 g/dL — AB (ref 11.6–15.9)
LYMPH%: 40.9 % (ref 14.0–49.7)
MCH: 27.9 pg (ref 25.1–34.0)
MCHC: 32.9 g/dL (ref 31.5–36.0)
MCV: 84.7 fL (ref 79.5–101.0)
MONO#: 0.6 10*3/uL (ref 0.1–0.9)
MONO%: 18.9 % — ABNORMAL HIGH (ref 0.0–14.0)
NEUT%: 39 % (ref 38.4–76.8)
NEUTROS ABS: 1.3 10*3/uL — AB (ref 1.5–6.5)
NRBC: 0 % (ref 0–0)
PLATELETS: 213 10*3/uL (ref 145–400)
RBC: 4.05 10*6/uL (ref 3.70–5.45)
RDW: 19.3 % — ABNORMAL HIGH (ref 11.2–14.5)
WBC: 3.3 10*3/uL — ABNORMAL LOW (ref 3.9–10.3)
lymph#: 1.3 10*3/uL (ref 0.9–3.3)

## 2014-01-02 MED ORDER — SODIUM CHLORIDE 0.9 % IJ SOLN
10.0000 mL | INTRAMUSCULAR | Status: DC | PRN
  Administered 2014-01-02: 10 mL
  Filled 2014-01-02: qty 10

## 2014-01-02 MED ORDER — SODIUM CHLORIDE 0.9 % IV SOLN
Freq: Once | INTRAVENOUS | Status: AC
  Administered 2014-01-02: 10:00:00 via INTRAVENOUS

## 2014-01-02 MED ORDER — HEPARIN SOD (PORK) LOCK FLUSH 100 UNIT/ML IV SOLN
500.0000 [IU] | Freq: Once | INTRAVENOUS | Status: AC | PRN
  Administered 2014-01-02: 500 [IU]
  Filled 2014-01-02: qty 5

## 2014-01-02 MED ORDER — FLUOROURACIL CHEMO INJECTION 2.5 GM/50ML
300.0000 mg/m2 | Freq: Once | INTRAVENOUS | Status: AC
  Administered 2014-01-02: 550 mg via INTRAVENOUS
  Filled 2014-01-02: qty 11

## 2014-01-02 MED ORDER — LEUCOVORIN CALCIUM INJECTION 350 MG
300.0000 mg/m2 | Freq: Once | INTRAVENOUS | Status: AC
  Administered 2014-01-02: 538 mg via INTRAVENOUS
  Filled 2014-01-02: qty 26.9

## 2014-01-02 NOTE — Patient Instructions (Signed)
[facility] Discharge Instructions for Patients Receiving Chemotherapy  Today you received the following chemotherapy agents Leucovorin and 5FU.  To help prevent nausea and vomiting after your treatment, we encourage you to take your nausea medication.   If you develop nausea and vomiting that is not controlled by your nausea medication, call the clinic.   BELOW ARE SYMPTOMS THAT SHOULD BE REPORTED IMMEDIATELY:  *FEVER GREATER THAN 100.5 F  *CHILLS WITH OR WITHOUT FEVER  NAUSEA AND VOMITING THAT IS NOT CONTROLLED WITH YOUR NAUSEA MEDICATION  *UNUSUAL SHORTNESS OF BREATH  *UNUSUAL BRUISING OR BLEEDING  TENDERNESS IN MOUTH AND THROAT WITH OR WITHOUT PRESENCE OF ULCERS  *URINARY PROBLEMS  *BOWEL PROBLEMS  UNUSUAL RASH Items with * indicate a potential emergency and should be followed up as soon as possible.  Feel free to call the clinic you have any questions or concerns. The clinic phone number is [phone].    

## 2014-01-02 NOTE — Progress Notes (Signed)
Ok to treat with Leucovorin and 5FU with ANC 1.3 per Mikey Bussing NP.

## 2014-01-02 NOTE — Progress Notes (Signed)
[place] OFFICE PROGRESS NOTE   Diagnosis:  Gastric cancer  INTERVAL HISTORY:   She returns as scheduled. She is due for cycle 2 day 15 of 5-FU/leucovorin 12/19/2013. She notes that she is more fatigued for 2-3 days after the chemotherapy. She denies nausea/vomiting. No mouth sores. No dysphagia or odynophagia. She notes the palms and soles are mildly red, but denies discomfort. She denies any skin breakdown. Appetite is improving. She is eating small frequent meals. Weight is stable.  Objective:  Vital signs in last 24 hours:  Blood pressure 117/59, pulse 93, temperature 98 F (36.7 C), temperature source Oral, resp. rate 18, height 5\' 6"  (1.676 m), weight 152 lb (68.947 kg), SpO2 100.00%.    HEENT: No thrush or ulcers. Resp: Lungs clear bilaterally. Cardio: Regular rate and rhythm. GI: Abdomen soft and nontender. No hepatomegaly. Abdominal midline wound has healed. Vascular: No leg edema.  Skin: Palms and soles with mild erythema and hyperpigmentation. No skin breakdown.  Port-A-Cath without erythema.    Lab Results:  Lab Results  Component Value Date   WBC 3.3* 01/02/2014   HGB 11.3* 01/02/2014   HCT 34.3* 01/02/2014   MCV 84.7 01/02/2014   PLT 213 01/02/2014   NEUTROABS 1.3* 01/02/2014    Imaging:  No results found.  Medications: I have reviewed the patient's current medications.  Assessment/Plan: 1. Gastric cancer status post upper endoscopy 09/22/2013 with findings of a partially obstructing oozing cratered gastric ulcer in the gastric antrum.  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  CT scans chest/abdomen/pelvis 10/03/2013 showed a 7 x 5 mm groundglass opacity in the superior segment of the right lower lobe; a 4.5 mm nodular density in the right upper lobe; bilateral axillary adenopathy; low density area anteriorly in the left hepatic lobe most consistent with fatty infiltration; another hypodense area  within the medial segment of the left hepatic lobe; severe gastric distention; mild bilateral inguinal adenopathy.  Subtotal gastrectomy and lymph node dissection with creation of a gastrojejunostomy 10/10/2013. No evidence of distant metastatic disease noted at the time of surgery. No evidence of liver metastases. 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 Tumor involved the serosal surface. Resection margins were negative. 10 of 17 lymph nodes were positive for metastatic adenocarcinoma.  Cycle 1 weekly 5-FU/leucovorin 11/14/2013, dose reduced beginning with week 3 secondary to mucositis and hand/foot syndrome.  Cycle 2 weekly 5-FU/leucovorin beginning 12/19/2013. 2. Gastric outlet obstruction secondary to #1. Resolved. 3. Nausea/vomiting secondary to #2. Resolved. 4. Abdominal pain secondary to #1 and #2. Improved. 5. Weight loss secondary to #1 and #2. 6. Microcytic anemia, likely iron deficiency. She received iron dextran 10/11/2013. Improved. 7. Lupus/Sjgren's. 8. Pneumonia during hospitalization July 2015.  9. Abdominal abscesses with body fluid culture 10/20/2013 showing moderate Candida tropicalis. She was discharged home on a 4 week course of fluconazole.  10. Mucositis and hand/foot syndrome following cycle 1-week #3 5-FU/leucovorin, the 5-FU and leucovorin were dose reduced. 11. 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 12. Status post LEEP 11/07/2013. Pathology shows moderate to severe dysplasia.   Disposition: She appears stable. Plan to proceed with cycle 2 week 3 5-FU/leucovorin today as scheduled.  She has mild hand-foot syndrome with erythema of the palms and soles. She will return for lab/chemo in 1 week and a followup visit in 2 weeks.  She will contact the office prior to her next visit with any problems.  Plan reviewed with Dr. Benay Spice.  Mikey Bussing, DNP, AGPCNP-BC    01/02/2014  9:35 AM

## 2014-01-02 NOTE — Progress Notes (Signed)
Nutrition followup completed with patient during chemotherapy.  Patient states she feels well.  Weight increased and documented as 152 pounds October 9, up from 150.4 pounds September 24.  Patient is able to eat more often.  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  Nutrition diagnosis: Inadequate oral intake improved.  Intervention: Patient educated to continue small frequent meals and snacks. Enforced education on signs and symptoms of dumping syndrome and strategies for eating to avoid. Questions were answered and teach back method used.  Monitoring, evaluation, goals: Patient will continue to tolerate increased oral intake to minimize weight loss.  Next visit: Has not been scheduled at this time.  I will continue to work with patient as needed.  She has my phone number for questions or concerns.  **Disclaimer: This note was dictated with voice recognition software. Similar sounding words can inadvertently be transcribed and this note may contain transcription errors which may not have been corrected upon publication of note.**

## 2014-01-09 ENCOUNTER — Ambulatory Visit (HOSPITAL_BASED_OUTPATIENT_CLINIC_OR_DEPARTMENT_OTHER): Payer: 59

## 2014-01-09 ENCOUNTER — Other Ambulatory Visit: Payer: Self-pay | Admitting: Oncology

## 2014-01-09 ENCOUNTER — Other Ambulatory Visit: Payer: Self-pay | Admitting: *Deleted

## 2014-01-09 ENCOUNTER — Other Ambulatory Visit: Payer: Self-pay | Admitting: Nurse Practitioner

## 2014-01-09 ENCOUNTER — Other Ambulatory Visit (HOSPITAL_BASED_OUTPATIENT_CLINIC_OR_DEPARTMENT_OTHER): Payer: 59

## 2014-01-09 ENCOUNTER — Other Ambulatory Visit (HOSPITAL_BASED_OUTPATIENT_CLINIC_OR_DEPARTMENT_OTHER): Payer: 59 | Admitting: Oncology

## 2014-01-09 VITALS — BP 120/84 | HR 78 | Temp 98.8°F

## 2014-01-09 DIAGNOSIS — C163 Malignant neoplasm of pyloric antrum: Secondary | ICD-10-CM

## 2014-01-09 DIAGNOSIS — C169 Malignant neoplasm of stomach, unspecified: Secondary | ICD-10-CM

## 2014-01-09 DIAGNOSIS — Z5111 Encounter for antineoplastic chemotherapy: Secondary | ICD-10-CM

## 2014-01-09 LAB — CBC WITH DIFFERENTIAL/PLATELET
BASO%: 0.5 % (ref 0.0–2.0)
Basophils Absolute: 0 10*3/uL (ref 0.0–0.1)
EOS ABS: 0 10*3/uL (ref 0.0–0.5)
EOS%: 1 % (ref 0.0–7.0)
HCT: 34.9 % (ref 34.8–46.6)
HEMOGLOBIN: 11.5 g/dL — AB (ref 11.6–15.9)
LYMPH%: 47.9 % (ref 14.0–49.7)
MCH: 28.5 pg (ref 25.1–34.0)
MCHC: 33 g/dL (ref 31.5–36.0)
MCV: 86.6 fL (ref 79.5–101.0)
MONO#: 0.8 10*3/uL (ref 0.1–0.9)
MONO%: 21.8 % — ABNORMAL HIGH (ref 0.0–14.0)
NEUT%: 28.8 % — ABNORMAL LOW (ref 38.4–76.8)
NEUTROS ABS: 1.1 10*3/uL — AB (ref 1.5–6.5)
Platelets: 221 10*3/uL (ref 145–400)
RBC: 4.03 10*6/uL (ref 3.70–5.45)
RDW: 18.8 % — AB (ref 11.2–14.5)
WBC: 3.9 10*3/uL (ref 3.9–10.3)
lymph#: 1.9 10*3/uL (ref 0.9–3.3)
nRBC: 0 % (ref 0–0)

## 2014-01-09 LAB — COMPREHENSIVE METABOLIC PANEL (CC13)
ALBUMIN: 3 g/dL — AB (ref 3.5–5.0)
ALT: 21 U/L (ref 0–55)
ANION GAP: 6 meq/L (ref 3–11)
AST: 22 U/L (ref 5–34)
Alkaline Phosphatase: 60 U/L (ref 40–150)
BUN: 6.5 mg/dL — AB (ref 7.0–26.0)
CALCIUM: 8.9 mg/dL (ref 8.4–10.4)
CHLORIDE: 106 meq/L (ref 98–109)
CO2: 25 meq/L (ref 22–29)
Creatinine: 0.6 mg/dL (ref 0.6–1.1)
Glucose: 91 mg/dl (ref 70–140)
POTASSIUM: 3.9 meq/L (ref 3.5–5.1)
Sodium: 137 mEq/L (ref 136–145)
Total Bilirubin: 0.61 mg/dL (ref 0.20–1.20)
Total Protein: 7.2 g/dL (ref 6.4–8.3)

## 2014-01-09 MED ORDER — SODIUM CHLORIDE 0.9 % IJ SOLN
10.0000 mL | INTRAMUSCULAR | Status: DC | PRN
  Administered 2014-01-09: 10 mL
  Filled 2014-01-09: qty 10

## 2014-01-09 MED ORDER — SODIUM CHLORIDE 0.9 % IV SOLN
Freq: Once | INTRAVENOUS | Status: AC
  Administered 2014-01-09: 13:00:00 via INTRAVENOUS

## 2014-01-09 MED ORDER — LEUCOVORIN CALCIUM INJECTION 350 MG
300.0000 mg/m2 | Freq: Once | INTRAVENOUS | Status: AC
  Administered 2014-01-09: 538 mg via INTRAVENOUS
  Filled 2014-01-09: qty 26.9

## 2014-01-09 MED ORDER — FLUOROURACIL CHEMO INJECTION 2.5 GM/50ML
300.0000 mg/m2 | Freq: Once | INTRAVENOUS | Status: AC
  Administered 2014-01-09: 550 mg via INTRAVENOUS
  Filled 2014-01-09: qty 11

## 2014-01-09 MED ORDER — HEPARIN SOD (PORK) LOCK FLUSH 100 UNIT/ML IV SOLN
500.0000 [IU] | Freq: Once | INTRAVENOUS | Status: AC | PRN
  Administered 2014-01-09: 500 [IU]
  Filled 2014-01-09: qty 5

## 2014-01-09 NOTE — Patient Instructions (Signed)
[facility] Discharge Instructions for Patients Receiving Chemotherapy  Today you received the following chemotherapy agents fluorouracil/leucovorin.   To help prevent nausea and vomiting after your treatment, we encourage you to take your nausea medication as directed   If you develop nausea and vomiting that is not controlled by your nausea medication, call the clinic.   BELOW ARE SYMPTOMS THAT SHOULD BE REPORTED IMMEDIATELY:  *FEVER GREATER THAN 100.5 F  *CHILLS WITH OR WITHOUT FEVER  NAUSEA AND VOMITING THAT IS NOT CONTROLLED WITH YOUR NAUSEA MEDICATION  *UNUSUAL SHORTNESS OF BREATH  *UNUSUAL BRUISING OR BLEEDING  TENDERNESS IN MOUTH AND THROAT WITH OR WITHOUT PRESENCE OF ULCERS  *URINARY PROBLEMS  *BOWEL PROBLEMS  UNUSUAL RASH Items with * indicate a potential emergency and should be followed up as soon as possible.  Feel free to call the clinic you have any questions or concerns. The clinic phone number is [phone].

## 2014-01-09 NOTE — Progress Notes (Signed)
Reviewed labs with Dr Benay Spice, ok to treat today. Will draw CMET, no need to wait on results to begin treatment.     Pt refused zofran today.

## 2014-01-12 ENCOUNTER — Ambulatory Visit
Admission: RE | Admit: 2014-01-12 | Discharge: 2014-01-12 | Disposition: A | Discharge status: Home / Self Care | Payer: 59 | Source: Ambulatory Visit | Attending: Radiation Oncology | Admitting: Radiation Oncology

## 2014-01-12 ENCOUNTER — Encounter: Payer: Self-pay | Admitting: Radiation Oncology

## 2014-01-12 VITALS — BP 102/54 | HR 78 | Temp 97.7°F | Resp 20 | Ht 66.0 in | Wt 155.3 lb

## 2014-01-12 DIAGNOSIS — R634 Abnormal weight loss: Secondary | ICD-10-CM | POA: Insufficient documentation

## 2014-01-12 DIAGNOSIS — R140 Abdominal distension (gaseous): Secondary | ICD-10-CM | POA: Diagnosis not present

## 2014-01-12 DIAGNOSIS — C169 Malignant neoplasm of stomach, unspecified: Secondary | ICD-10-CM | POA: Diagnosis not present

## 2014-01-12 DIAGNOSIS — L859 Epidermal thickening, unspecified: Secondary | ICD-10-CM | POA: Diagnosis not present

## 2014-01-12 DIAGNOSIS — R5383 Other fatigue: Secondary | ICD-10-CM | POA: Diagnosis not present

## 2014-01-12 DIAGNOSIS — C163 Malignant neoplasm of pyloric antrum: Secondary | ICD-10-CM

## 2014-01-12 DIAGNOSIS — R112 Nausea with vomiting, unspecified: Secondary | ICD-10-CM | POA: Insufficient documentation

## 2014-01-12 DIAGNOSIS — Z510 Encounter for antineoplastic radiation therapy: Secondary | ICD-10-CM | POA: Insufficient documentation

## 2014-01-12 NOTE — Progress Notes (Signed)
GI Location of Tumor / Histology : Gastric Cancer Follow up Andrea Best presented months ago with symptoms [id number] nausea/vomiting and bloating, weight loss/fatigue  Biopsies of (if applicable) revealed: Diagnosis 09/29/13:  1. Cervix, biopsy, 6 o'clock- HIGH GRADE SQUAMOUS INTRAEPITHELIAL LESION, CIN-II (MODERATE DYSPLASIA) WITH  PROMINENT HYPERKERATOSIS.2. Cervix, biopsy, 12 o'clock- AT LEAST HIGH GRADE SQUAMOUS INTRAEPITHELIAL LESION, CIN-II (MODERATE DYSPLASIA)  WITH PROMINENT HYPERKERATOSIS.3. Endocervix, curettage- MUCUS AND SCANT BENIGN ENDOCERVICAL EPITHELIUM.- NO SQUAMOUS EPITHELIUM PRESENT.  Diagnosis 09/22/13:  Stomach, biopsy, antrum, gastric outlet- POORLY DIFFERENTIATED CARCINOMA WITH SIGNET CELL DIFFERENTIATION, SEE COMMENT.  Past/Anticipated interventions by surgeon, if [id number] : 10/10/13  1. Lymph node, biopsy, peri gastric- POSITIVE FOR METASTATIC ADENOCARCINOMA (1/1).2. 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  - NO DYSPLASIA OR MALIGNANCY IDENTIFIED.3. Stomach, resection for tumor, distal stomach and duodenum- INVASIVE MODERATELY TO POORLY DIFFERENTIATED ADENOCARCINOMA SPANNING 5 CM  IN GREATEST DIMENSION.- TUMOR INVADES THROUGH MUSCULARIS PROPRIA TO INVOLVE SEROSAL SURFACE.- PROXIMAL AND DISTAL MARGINS ARE NEGATIVE.  - NINE OF FIFTEEN LYMPH NODES POSITIVE FOR METASTATIC ADENOCARCINOMA (9/15).- SEE ONCOLOGY TEMPLATE.4. Lymph node, biopsy, portal lymph node- ONE BENIGN LYMPH NODE WITH NO TUMOR SEEN (0/1).- SEE COMMENT.5. 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 Excell Seltzer, MD, p/o recheck on 11/12/13  Past/Anticipated interventions by medical oncology, if any:Dr.Sherril  01/09/14, Reviewed labs with Dr Benay Spice, ok to treat today. Will draw CMET, no need to  wait on results to begin treatment.  completed 10fu/leucovorin   Weight changes, if any: has gained some weight, saw Andrea Best,dietcian10/9/15,occsional dumping syndrome,,   Bowel/Bladder complaints, if FXO:VANVBTYO, Belching, Chronic GI bleeding from gastric cancer, abdominal abscess drained 10/21/13 , Nausea / Vomiting, if any: n   Pain issues, if any: Abdominal,  Pacemaker/ICD? no  Possible current pregnancy? no  Is the patient on methotrexate? no Current Complaints / other details: Follow up New consult

## 2014-01-13 ENCOUNTER — Encounter: Payer: Self-pay | Admitting: Radiation Oncology

## 2014-01-13 NOTE — Progress Notes (Signed)
Radiation Oncology         [phone] ________________________________  Name: Andrea Best MRN: [id number]  Date: 01/12/2014  DOB: 1970-11-03  Follow-Up Visit Note  CC: Andrea Evener, MD  Ladell Pier, MD  Diagnosis:    Gastric cancer   Primary site: Stomach   Staging method: AJCC 7th Edition   Clinical free text: [id number]   Clinical: ([place], [place])   Pathologic: Stage IIIC (T4a, N3a, cM0) signed by Ladell Pier, MD on 10/21/2013  7:35 AM   Summary: Stage IIIC (T4a, N3a, cM0)    Narrative:  The patient returns today for routine follow-up.   The patient is seen today for followup regarding her diagnosis of gastric cancer. She was found to have a locally advanced gastric cancer and postoperative chemoradiation treatment was recommended for her. The patient has proceeded with postoperative chemotherapy. She now returns for further discussion of radiation treatment at this time. She has completed HER-2 cycles and states that she did reasonably well during treatment. She states that she did better after a dose reduction.  The patient continues to adjust to her surgery. She has gained some weight. The patient is pleased that her surgical wound has completely healed.                              ALLERGIES:  is allergic to compazine; azithromycin; sulfa antibiotics; hydrocodone-acetaminophen; and other.  Meds: Current Outpatient Prescriptions  Medication Sig Dispense Refill  . hydroxychloroquine (PLAQUENIL) 200 MG tablet Take 200 mg by mouth 2 (two) times daily.      . lansoprazole (PREVACID SOLUTAB) 15 MG disintegrating tablet Take 15 mg by mouth 2 (two) times daily before a meal.      . lidocaine-prilocaine (EMLA) cream Appy small amount of cream over port site 1-2 hours prior to treatment and cover with plastic wrap.  DO NOT RUB IN  30 g  prn  . predniSONE (DELTASONE) 5 MG tablet Take 5 mg by mouth daily with breakfast.       . Bepotastine Besilate (BEPREVE) 1.5 % SOLN Place  1 drop into both eyes daily with breakfast.      . doxylamine, Sleep, (UNISOM) 25 MG tablet Take 25 mg by mouth at bedtime as needed.      . ondansetron (ZOFRAN-ODT) 8 MG disintegrating tablet Take 1 tablet (8 mg total) by mouth every 8 (eight) hours as needed for nausea or vomiting.  20 tablet  0  . polyethylene glycol (MIRALAX / GLYCOLAX) packet Take 17 g by mouth daily as needed for mild constipation.        No current facility-administered medications for this encounter.    Physical Findings: The patient is in no acute distress. Patient is alert and oriented.  height is 5' 6"  (1.676 m) and weight is 155 lb 4.8 oz (70.444 kg). Her oral temperature is 97.7 F (36.5 C). Her blood pressure is 102/54 and her pulse is 78. Her respiration is 20. .     Lab Findings: Lab Results  Component Value Date   WBC 3.9 01/09/2014   HGB 11.5* 01/09/2014   HCT 34.9 01/09/2014   MCV 86.6 01/09/2014   PLT 221 01/09/2014     Radiographic Findings: No results found.  Impression:    The patient has completed 2 cycles of chemotherapy postoperatively for her locally advanced gastric cancer. She is appropriate in my opinion to proceed with chemoradiation treatment at this  time. I discussed this with the patient. She is apprehensive about radiation treatment and we had a good discussion today regarding the possible benefit versus the possible side effects/risks. All of her questions were answered. She I believe is leaning towards proceeding with this treatment although she is going to continue to think about things and she does see medical oncology later this week.  Plan:  We will tentatively schedule the patient for simulation later this week such that we can proceed with treatment planning. The patient was encouraged to contact me if she has any further questions or concerns.  I spent 30 minutes with the patient today, the majority of which was spent counseling the patient on the diagnosis of cancer and  coordinating care.   Jodelle Gross, M.D., Ph.D.

## 2014-01-15 ENCOUNTER — Ambulatory Visit: Admission: RE | Admit: 2014-01-15 | Payer: 59 | Source: Ambulatory Visit | Admitting: Radiation Oncology

## 2014-01-15 ENCOUNTER — Telehealth: Payer: Self-pay | Admitting: *Deleted

## 2014-01-15 ENCOUNTER — Other Ambulatory Visit: Payer: Self-pay | Admitting: *Deleted

## 2014-01-15 DIAGNOSIS — C169 Malignant neoplasm of stomach, unspecified: Secondary | ICD-10-CM

## 2014-01-15 NOTE — Telephone Encounter (Signed)
Called patient to cancel fu visit for 01-16-14 per Dr. Lisbeth Renshaw, spoke with patient and she is aware of this being cancelled and why it is being cancelled

## 2014-01-16 ENCOUNTER — Ambulatory Visit
Admission: RE | Admit: 2014-01-16 | Discharge: 2014-01-16 | Disposition: A | Discharge status: Home / Self Care | Payer: 59 | Source: Ambulatory Visit | Attending: Radiation Oncology | Admitting: Radiation Oncology

## 2014-01-16 ENCOUNTER — Encounter: Payer: Self-pay | Admitting: Oncology

## 2014-01-16 ENCOUNTER — Telehealth: Payer: Self-pay | Admitting: Oncology

## 2014-01-16 ENCOUNTER — Ambulatory Visit (HOSPITAL_BASED_OUTPATIENT_CLINIC_OR_DEPARTMENT_OTHER): Payer: 59 | Admitting: Oncology

## 2014-01-16 ENCOUNTER — Other Ambulatory Visit (HOSPITAL_BASED_OUTPATIENT_CLINIC_OR_DEPARTMENT_OTHER): Payer: 59

## 2014-01-16 VITALS — BP 100/59 | HR 72 | Temp 98.4°F | Resp 18 | Ht 66.0 in | Wt 155.1 lb

## 2014-01-16 DIAGNOSIS — C169 Malignant neoplasm of stomach, unspecified: Secondary | ICD-10-CM

## 2014-01-16 DIAGNOSIS — G893 Neoplasm related pain (acute) (chronic): Secondary | ICD-10-CM | POA: Diagnosis not present

## 2014-01-16 DIAGNOSIS — C163 Malignant neoplasm of pyloric antrum: Secondary | ICD-10-CM | POA: Diagnosis not present

## 2014-01-16 DIAGNOSIS — Z510 Encounter for antineoplastic radiation therapy: Secondary | ICD-10-CM | POA: Diagnosis not present

## 2014-01-16 DIAGNOSIS — D709 Neutropenia, unspecified: Secondary | ICD-10-CM | POA: Diagnosis not present

## 2014-01-16 LAB — CBC WITH DIFFERENTIAL/PLATELET
BASO%: 0.3 % (ref 0.0–2.0)
Basophils Absolute: 0 10*3/uL (ref 0.0–0.1)
EOS ABS: 0.1 10*3/uL (ref 0.0–0.5)
EOS%: 1.4 % (ref 0.0–7.0)
HCT: 33.5 % — ABNORMAL LOW (ref 34.8–46.6)
HGB: 11 g/dL — ABNORMAL LOW (ref 11.6–15.9)
LYMPH#: 1.4 10*3/uL (ref 0.9–3.3)
LYMPH%: 38.2 % (ref 14.0–49.7)
MCH: 28.4 pg (ref 25.1–34.0)
MCHC: 32.8 g/dL (ref 31.5–36.0)
MCV: 86.6 fL (ref 79.5–101.0)
MONO#: 0.7 10*3/uL (ref 0.1–0.9)
MONO%: 19.1 % — ABNORMAL HIGH (ref 0.0–14.0)
NEUT%: 41 % (ref 38.4–76.8)
NEUTROS ABS: 1.5 10*3/uL (ref 1.5–6.5)
Platelets: 163 10*3/uL (ref 145–400)
RBC: 3.87 10*6/uL (ref 3.70–5.45)
RDW: 18.7 % — AB (ref 11.2–14.5)
WBC: 3.6 10*3/uL — AB (ref 3.9–10.3)

## 2014-01-16 NOTE — Progress Notes (Signed)
[facility] OFFICE PROGRESS NOTE   Diagnosis:  Gastric cancer  INTERVAL HISTORY:   She returns as scheduled. She has completed 2 months of 5-FU/leucovorin 12/19/2013. She is feeling well today. She denies nausea/vomiting. No mouth sores. No dysphagia or odynophagia. She notes the palms and soles are mildly red, but denies discomfort. She denies any skin breakdown. Appetite is improving. She is eating small frequent meals. Weight is stable. She has been seeing a radiation oncology and is due for CT simulation later today. The patient is somewhat unsure about proceeding with radiation.  Objective:  Vital signs in last 24 hours:  Blood pressure 100/59, pulse 72, temperature 98.4 F (36.9 C), temperature source Oral, resp. rate 18, height 5\' 6"  (1.676 m), weight 155 lb 1.6 oz (70.353 kg), SpO2 100.00%.    HEENT: No thrush or ulcers. Resp: Lungs clear bilaterally. Cardio: Regular rate and rhythm. GI: Abdomen soft and nontender. No hepatomegaly. Abdominal midline wound has healed. Vascular: No leg edema.  Skin: Palms and soles with mild erythema and hyperpigmentation. No skin breakdown.  Port-A-Cath without erythema.    Lab Results:  Lab Results  Component Value Date   WBC 3.6* 01/16/2014   HGB 11.0* 01/16/2014   HCT 33.5* 01/16/2014   MCV 86.6 01/16/2014   PLT 163 01/16/2014   NEUTROABS 1.5 01/16/2014    Imaging:  No results found.  Medications: I have reviewed the patient's current medications.  Assessment/Plan: 1. Gastric cancer status post upper endoscopy 09/22/2013 with findings of a partially obstructing oozing cratered gastric ulcer in the gastric antrum.  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  CT scans chest/abdomen/pelvis 10/03/2013 showed a 7 x 5 mm groundglass opacity in the superior segment of the right lower lobe; a 4.5 mm nodular density in the right upper lobe; bilateral axillary adenopathy; low  density area anteriorly in the left hepatic lobe most consistent with fatty infiltration; another hypodense area within the medial segment of the left hepatic lobe; severe gastric distention; mild bilateral inguinal adenopathy.  Subtotal gastrectomy and lymph node dissection with creation of a gastrojejunostomy 10/10/2013. No evidence of distant metastatic disease noted at the time of surgery. No evidence of liver metastases. 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 Tumor involved the serosal surface. Resection margins were negative. 10 of 17 lymph nodes were positive for metastatic adenocarcinoma.  Cycle 1 weekly 5-FU/leucovorin 11/14/2013, dose reduced beginning with week 3 secondary to mucositis and hand/foot syndrome.  Cycle 2 weekly 5-FU/leucovorin beginning 12/19/2013. 2. Gastric outlet obstruction secondary to #1. Resolved. 3. Nausea/vomiting secondary to #2. Resolved. 4. Abdominal pain secondary to #1 and #2. Improved. 5. Weight loss secondary to #1 and #2. 6. Microcytic anemia, likely iron deficiency. She received iron dextran 10/11/2013. Improved. 7. Lupus/Sjgren's. 8. Pneumonia during hospitalization July 2015.  9. Abdominal abscesses with body fluid culture 10/20/2013 showing moderate Candida tropicalis. She was discharged home on a 4 week course of fluconazole.  10. Mucositis and hand/foot syndrome following cycle 1-week #3 5-FU/leucovorin, the 5-FU and leucovorin were dose reduced. 11. 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 12. Status post LEEP 11/07/2013. Pathology shows moderate to severe dysplasia.   Disposition: She has tolerated chemotherapy well so far. Patient seen as a [place] visit with Dr. Benay Spice. Dr. Benay Spice has discussed with the patient and her mother that he strongly recommended that she proceed with radiation along with Xeloda as she is at high-risk for recurrence of her  cancer.  Side effects of chemotherapy and radiation discussed with the patient including, but not limited to possible skin changes, nausea, diarrhea, myelosuppression, mucositis, and hand-foot syndrome. She was also offered the alternative of using continuous 5-FU infusion along with her radiation. The patient seems to have a better understanding of the treatment plan and would like to consider proceeding with Xeloda. She does not have a definitive start date. We anticipate that she may start her radiation on or around November 2. She was instructed to take Xeloda twice a day on Mondays through Fridays only. We have tentatively scheduled her back for followup around November 12 or 13th. I have asked her to call our office to let us know definitive start date for her radiation.  She will contact the office prior to her next visit with any problems.  Plan reviewed with Dr. Benay Spice.  Mikey Bussing, DNP, AGPCNP-BC   01/16/2014  12:50 PM   This was a shared visit with Mikey Bussing.  She has completed 2 cycles of adjuvant 5-FU/leucovorin. The plan is to proceed with adjuvant radiation and concurrent capecitabine. We reviewed the potential toxicities associated with this regimen and she agrees to proceed.  Julieanne Manson, M.D.

## 2014-01-16 NOTE — Telephone Encounter (Signed)
gv adn printed appt sched and avs for pt for NOV °

## 2014-01-19 ENCOUNTER — Other Ambulatory Visit: Payer: Self-pay | Admitting: *Deleted

## 2014-01-19 ENCOUNTER — Encounter: Payer: Self-pay | Admitting: *Deleted

## 2014-01-19 MED ORDER — CAPECITABINE 500 MG PO TABS: 1500.0000 mg | ORAL_TABLET | Freq: Two times a day (BID) | ORAL | Status: DC

## 2014-01-19 NOTE — Progress Notes (Signed)
Received notification from [facility] that her insurance will not allow them to service her Xeloda. Forwarded script to [facility] in managed care.

## 2014-01-20 NOTE — Progress Notes (Signed)
Received fax from [facility] that script for Xeloda has been received and in the insurance verification process.

## 2014-01-22 ENCOUNTER — Encounter: Payer: Self-pay | Admitting: *Deleted

## 2014-01-22 NOTE — Progress Notes (Signed)
RECEIVED A FAX FROM [facility] CONCERNING A CONFIRMATION OF PRESCRIPTION SHIPMENT FOR CAPECITABINE ON 01/21/14.

## 2014-01-23 ENCOUNTER — Ambulatory Visit: Payer: 59 | Admitting: Radiation Oncology

## 2014-01-23 ENCOUNTER — Encounter: Payer: Self-pay | Admitting: Radiation Oncology

## 2014-01-23 DIAGNOSIS — Z510 Encounter for antineoplastic radiation therapy: Secondary | ICD-10-CM | POA: Diagnosis not present

## 2014-01-26 ENCOUNTER — Encounter: Payer: Self-pay | Admitting: Oncology

## 2014-01-26 ENCOUNTER — Inpatient Hospital Stay
Admission: RE | Admit: 2014-01-26 | Discharge: 2014-01-26 | Disposition: A | Discharge status: Home / Self Care | Payer: 59 | Source: Ambulatory Visit | Attending: Radiation Oncology | Admitting: Radiation Oncology

## 2014-01-26 ENCOUNTER — Telehealth: Payer: Self-pay | Admitting: *Deleted

## 2014-01-26 ENCOUNTER — Ambulatory Visit
Admission: RE | Admit: 2014-01-26 | Discharge: 2014-01-26 | Disposition: A | Discharge status: Home / Self Care | Payer: 59 | Source: Ambulatory Visit | Attending: Radiation Oncology | Admitting: Radiation Oncology

## 2014-01-26 DIAGNOSIS — C163 Malignant neoplasm of pyloric antrum: Secondary | ICD-10-CM

## 2014-01-26 DIAGNOSIS — Z510 Encounter for antineoplastic radiation therapy: Secondary | ICD-10-CM | POA: Diagnosis not present

## 2014-01-26 MED ORDER — RADIAPLEXRX EX GEL
Freq: Once | CUTANEOUS | Status: AC
  Administered 2014-01-26: 13:00:00 via TOPICAL

## 2014-01-26 NOTE — Telephone Encounter (Signed)
Pt called wanted to know if she should still come in today.  She reports she started her menstrual cycle and has "been feeling under the weather" the last few days.  She denies a fever.  She reports she is occasionally coughing.  Instructed she can still come in and see Dr. Lisbeth Renshaw.  Also instructed her to let the front staff know that she has been coughing so they can give her a mask.  She states she feels comfortable with this plan and will see [place] today.

## 2014-01-27 ENCOUNTER — Ambulatory Visit: Payer: 59

## 2014-01-27 ENCOUNTER — Telehealth: Payer: Self-pay | Admitting: *Deleted

## 2014-01-27 NOTE — Telephone Encounter (Signed)
Patient called stating she threw up all day yesterday after her 1st radiation treatment and was cancelling any further treatments all together,she is gastric/stomach cancer, she took zofran which didn't help stated", asked her if she would be williong to come in tomorrow and speak with Dr.moody before her treatment time 15 minutes before, will cancel todays treatment, Darvin Neighbours agreed to come tomorrow and discuss with Dr.Moody, will inform [facility]# 4 to cancel today;'s treatment Will forward this inbasket to Dr.Moody who is off today 9:24 AM

## 2014-01-28 ENCOUNTER — Encounter: Payer: Self-pay | Admitting: Radiation Oncology

## 2014-01-28 ENCOUNTER — Ambulatory Visit
Admission: RE | Admit: 2014-01-28 | Discharge: 2014-01-28 | Disposition: A | Discharge status: Home / Self Care | Payer: 59 | Source: Ambulatory Visit | Attending: Radiation Oncology | Admitting: Radiation Oncology

## 2014-01-28 ENCOUNTER — Ambulatory Visit: Admission: RE | Admit: 2014-01-28 | Payer: 59 | Source: Ambulatory Visit

## 2014-01-28 DIAGNOSIS — C163 Malignant neoplasm of pyloric antrum: Secondary | ICD-10-CM

## 2014-01-28 NOTE — Progress Notes (Signed)
Department of Radiation Oncology  Phone:  [phone] Fax:        [phone]  Weekly Treatment Note    Name: Andrea Best Date: 01/28/2014 MRN: [id number] DOB: 1970/10/19   Current dose: 1.8 Gy  Current fraction:1   MEDICATIONS: Current Outpatient Prescriptions  Medication Sig Dispense Refill  . Bepotastine Besilate (BEPREVE) 1.5 % SOLN Place 1 drop into both eyes daily with breakfast.    . capecitabine (XELODA) 500 MG tablet Take 3 tablets (1,500 mg total) by mouth 2 (two) times daily after a meal. On Monday-Friday (days of radiation therapy only 168 tablet 0  . doxylamine, Sleep, (UNISOM) 25 MG tablet Take 25 mg by mouth at bedtime as needed.    Derrill Memo ON 02/02/2014] hyaluronate sodium (RADIAPLEXRX) GEL Apply 1 application topically daily. Apply daily after radiation treatment daily    . hydroxychloroquine (PLAQUENIL) 200 MG tablet Take 200 mg by mouth 2 (two) times daily.    . lansoprazole (PREVACID SOLUTAB) 15 MG disintegrating tablet Take 15 mg by mouth 2 (two) times daily before a meal.    . lidocaine-prilocaine (EMLA) cream Appy small amount of cream over port site 1-2 hours prior to treatment and cover with plastic wrap.  DO NOT RUB IN 30 g prn  . ondansetron (ZOFRAN-ODT) 8 MG disintegrating tablet Take 1 tablet (8 mg total) by mouth every 8 (eight) hours as needed for nausea or vomiting. 20 tablet 0  . polyethylene glycol (MIRALAX / GLYCOLAX) packet Take 17 g by mouth daily as needed for mild constipation.     . predniSONE (DELTASONE) 5 MG tablet Take 5 mg by mouth daily with breakfast.      No current facility-administered medications for this visit.     ALLERGIES: Compazine; Azithromycin; Sulfa antibiotics; Hydrocodone-acetaminophen; and Other   LABORATORY DATA:  Lab Results  Component Value Date   WBC 3.6* 01/16/2014   HGB 11.0* 01/16/2014   HCT 33.5* 01/16/2014   MCV 86.6 01/16/2014   PLT 163 01/16/2014   Lab Results  Component Value Date   NA 137 01/09/2014   K 3.9 01/09/2014   CL 101 11/20/2013   CO2 25 01/09/2014   Lab Results  Component Value Date   ALT 21 01/09/2014   AST 22 01/09/2014   ALKPHOS 60 01/09/2014   BILITOT 0.61 01/09/2014     NARRATIVE: Andrea Best was seen today for weekly treatment management. The chart was checked and the patient's films were reviewed.  The patient's was seen today after experiencing nausea after her first radiation treatment on Monday. She states that she began having a headache and not feeling very well after taking Xeloda chemotherapy and then began feeling worse after her radiation. She describes having significant nausea the rest of the day. She did take her second dose of Xeloda chemotherapy later that night and describes feeling worse after that also. She did not come for treatment today and has expressed an interest in discontinuing her radiation. The patient was somewhat wary of radiation prior to beginning treatment and agreed after thorough discussion of her case with both myself and Dr. Benay Spice in medical oncology.  PHYSICAL EXAMINATION: vitals were not taken for this visit.     Alert, in no acute distress, feeling better today  ASSESSMENT: The patient wishes to discontinue her radiation treatment. I discussed with her possible options to make changes in her treatment to try to alleviate her nausea. This would include pre-medicating with Zofran prior to treatment on  a daily basis as well as adding other anti-emetics. From her description, it also is unclear to what degree Xeloda may contribute to the nausea as well. We discussed these possible options but the patient was steadfast in her wish to discontinue radiation treatment.  PLAN: the patient has been removed from the treatment schedule. She will see medical oncology later this week to make further plans regarding systemic chemotherapy. I would be happy to see the patient again if it is felt that radiation treatment  could be helpful or needs to be revisited.

## 2014-01-29 ENCOUNTER — Encounter: Payer: Self-pay | Admitting: Radiation Oncology

## 2014-01-29 ENCOUNTER — Ambulatory Visit: Payer: 59

## 2014-01-30 ENCOUNTER — Ambulatory Visit: Payer: 59

## 2014-01-30 ENCOUNTER — Ambulatory Visit: Payer: 59 | Admitting: Radiation Oncology

## 2014-02-02 ENCOUNTER — Ambulatory Visit: Payer: 59

## 2014-02-03 ENCOUNTER — Ambulatory Visit: Payer: 59

## 2014-02-04 ENCOUNTER — Ambulatory Visit: Payer: 59

## 2014-02-05 ENCOUNTER — Ambulatory Visit: Payer: 59

## 2014-02-06 ENCOUNTER — Other Ambulatory Visit (HOSPITAL_BASED_OUTPATIENT_CLINIC_OR_DEPARTMENT_OTHER): Payer: 59

## 2014-02-06 ENCOUNTER — Ambulatory Visit (HOSPITAL_BASED_OUTPATIENT_CLINIC_OR_DEPARTMENT_OTHER): Payer: 59

## 2014-02-06 ENCOUNTER — Ambulatory Visit: Payer: 59

## 2014-02-06 ENCOUNTER — Ambulatory Visit (HOSPITAL_BASED_OUTPATIENT_CLINIC_OR_DEPARTMENT_OTHER): Payer: 59 | Admitting: Nurse Practitioner

## 2014-02-06 ENCOUNTER — Telehealth: Payer: Self-pay | Admitting: Oncology

## 2014-02-06 VITALS — BP 99/53 | HR 92 | Temp 97.8°F | Resp 16

## 2014-02-06 VITALS — Ht 66.0 in | Wt 155.5 lb

## 2014-02-06 DIAGNOSIS — C163 Malignant neoplasm of pyloric antrum: Secondary | ICD-10-CM

## 2014-02-06 DIAGNOSIS — R634 Abnormal weight loss: Secondary | ICD-10-CM | POA: Diagnosis not present

## 2014-02-06 DIAGNOSIS — D509 Iron deficiency anemia, unspecified: Secondary | ICD-10-CM

## 2014-02-06 DIAGNOSIS — G893 Neoplasm related pain (acute) (chronic): Secondary | ICD-10-CM

## 2014-02-06 DIAGNOSIS — Z95828 Presence of other vascular implants and grafts: Secondary | ICD-10-CM

## 2014-02-06 DIAGNOSIS — C169 Malignant neoplasm of stomach, unspecified: Secondary | ICD-10-CM

## 2014-02-06 LAB — CBC WITH DIFFERENTIAL/PLATELET
BASO%: 0.3 % (ref 0.0–2.0)
Basophils Absolute: 0 10*3/uL (ref 0.0–0.1)
EOS%: 1.2 % (ref 0.0–7.0)
Eosinophils Absolute: 0.1 10*3/uL (ref 0.0–0.5)
HEMATOCRIT: 32.6 % — AB (ref 34.8–46.6)
HGB: 10.6 g/dL — ABNORMAL LOW (ref 11.6–15.9)
LYMPH%: 22.1 % (ref 14.0–49.7)
MCH: 29.2 pg (ref 25.1–34.0)
MCHC: 32.6 g/dL (ref 31.5–36.0)
MCV: 89.5 fL (ref 79.5–101.0)
MONO#: 0.8 10*3/uL (ref 0.1–0.9)
MONO%: 14.5 % — AB (ref 0.0–14.0)
NEUT#: 3.5 10*3/uL (ref 1.5–6.5)
NEUT%: 61.9 % (ref 38.4–76.8)
Platelets: 278 10*3/uL (ref 145–400)
RBC: 3.64 10*6/uL — ABNORMAL LOW (ref 3.70–5.45)
RDW: 17.7 % — ABNORMAL HIGH (ref 11.2–14.5)
WBC: 5.7 10*3/uL (ref 3.9–10.3)
lymph#: 1.3 10*3/uL (ref 0.9–3.3)

## 2014-02-06 LAB — COMPREHENSIVE METABOLIC PANEL (CC13)
ALK PHOS: 72 U/L (ref 40–150)
ALT: 24 U/L (ref 0–55)
AST: 33 U/L (ref 5–34)
Albumin: 3.3 g/dL — ABNORMAL LOW (ref 3.5–5.0)
Anion Gap: 5 mEq/L (ref 3–11)
BUN: 7.2 mg/dL (ref 7.0–26.0)
CO2: 25 mEq/L (ref 22–29)
CREATININE: 0.6 mg/dL (ref 0.6–1.1)
Calcium: 9.2 mg/dL (ref 8.4–10.4)
Chloride: 107 mEq/L (ref 98–109)
Glucose: 88 mg/dl (ref 70–140)
Potassium: 3.7 mEq/L (ref 3.5–5.1)
Sodium: 138 mEq/L (ref 136–145)
Total Bilirubin: 0.44 mg/dL (ref 0.20–1.20)
Total Protein: 7.9 g/dL (ref 6.4–8.3)

## 2014-02-06 MED ORDER — SODIUM CHLORIDE 0.9 % IJ SOLN
10.0000 mL | INTRAMUSCULAR | Status: DC | PRN
  Administered 2014-02-06: 10 mL via INTRAVENOUS
  Filled 2014-02-06: qty 10

## 2014-02-06 MED ORDER — HEPARIN SOD (PORK) LOCK FLUSH 100 UNIT/ML IV SOLN
500.0000 [IU] | Freq: Once | INTRAVENOUS | Status: AC
  Administered 2014-02-06: 500 [IU] via INTRAVENOUS
  Filled 2014-02-06: qty 5

## 2014-02-06 NOTE — Patient Instructions (Signed)

## 2014-02-06 NOTE — Telephone Encounter (Signed)
gv adn printed appt sched and avs for pt for NOV and DEC...sed added tx. °

## 2014-02-06 NOTE — Progress Notes (Signed)
[place] OFFICE PROGRESS NOTE   Diagnosis:  Gastric cancer  INTERVAL HISTORY:   Ms. Andrea Best returns as scheduled. She completed one fraction of radiation. She had significant nausea/vomiting. She attributes the nausea/vomiting to the combination of radiation and Xeloda. She decided to discontinue further radiation.  No recent nausea/vomiting. No dysphagia. She has a good appetite. Weight is stable. She reports a good energy level. She denies pain.  Objective:  Vital signs in last 24 hours:  Height 5\' 6"  (1.676 m), weight 155 lb 8 oz (70.534 kg).    HEENT: no thrush or ulcers. Lymphatics: shotty bilateral axillary nodes. Resp: lungs clear bilaterally. Cardio: regular rate and rhythm. GI: abdomen soft and nontender. No hepatomegaly. Healed abdominal midline wound. Vascular: no leg edema.  Port-A-Cath without erythema.    Lab Results:  Lab Results  Component Value Date   WBC 5.7 02/06/2014   HGB 10.6* 02/06/2014   HCT 32.6* 02/06/2014   MCV 89.5 02/06/2014   PLT 278 02/06/2014   NEUTROABS 3.5 02/06/2014    Imaging:  No results found.  Medications: I have reviewed the patient's current medications.  Assessment/Plan: 1. Gastric cancer status post upper endoscopy 09/22/2013 with findings of a partially obstructing oozing cratered gastric ulcer in the gastric antrum.  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   CT scans chest/abdomen/pelvis 10/03/2013 showed a 7 x 5 mm groundglass opacity in the superior segment of the right lower lobe; a 4.5 mm nodular density in the right upper lobe; bilateral axillary adenopathy; low density area anteriorly in the left hepatic lobe most consistent with fatty infiltration; another hypodense area within the medial segment of the left hepatic lobe; severe gastric distention; mild bilateral inguinal adenopathy.   Subtotal gastrectomy and lymph node dissection with creation of a  gastrojejunostomy 10/10/2013. No evidence of distant metastatic disease noted at the time of surgery. No evidence of liver metastases. 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 Tumor involved the serosal surface. Resection margins were negative. 10 of 17 lymph nodes were positive for metastatic adenocarcinoma.   Cycle 1 weekly 5-FU/leucovorin 11/14/2013, dose reduced beginning with week 3 secondary to mucositis and hand/foot syndrome.   Cycle 2 weekly 5-FU/leucovorin beginning 12/19/2013.  Initiation of radiation/Xeloda 01/26/2014. She decided to discontinue further radiation/Xeloda after one fraction due to significant nausea/vomiting. 2. Gastric outlet obstruction secondary to #1. Resolved. 3. Nausea/vomiting secondary to #2. Resolved. 4. Abdominal pain secondary to #1 and #2. Improved. 5. Weight loss secondary to #1 and #2. 6. Microcytic anemia, likely iron deficiency. She received iron dextran 10/11/2013. Improved. 7. Lupus/Sjgren's. 8. Pneumonia during hospitalization July 2015.  9. Abdominal abscesses with body fluid culture 10/20/2013 showing moderate Candida tropicalis. She was discharged home on a 4 week course of fluconazole.  10. Mucositis and hand/foot syndrome following cycle 1-week #3 5-FU/leucovorin, the 5-FU and leucovorin were dose reduced. 11. 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 12. Status post LEEP 11/07/2013. Pathology shows moderate to severe dysplasia.   Disposition: Ms. Andrea Best appears stable. She decided to discontinue radiation/Xeloda following one fraction due to excessive nausea/vomiting. Dr. Benay Spice recommends resuming weekly 5-FU/leucovorin 4 weeks on/1 week off for 2 additional cycles. She is in agreement. She will return to begin the first cycle on 02/13/2014. We will see her in follow-up on 02/27/2014. She will contact the office in the interim with any  problems.  Plan reviewed with Dr. Benay Spice.    Ned Card ANP/GNP-BC  02/06/2014  12:43 PM

## 2014-02-06 NOTE — Patient Instructions (Signed)
Oxaliplatin Injection What is this medicine? OXALIPLATIN (ox AL i PLA tin) is a chemotherapy drug. It targets fast dividing cells, like cancer cells, and causes these cells to die. This medicine is used to treat cancers of the colon and rectum, and many other cancers. This medicine may be used for other purposes; ask your health care provider or pharmacist if you have questions. COMMON BRAND NAME(S): Eloxatin What should I tell my health care provider before I take this medicine? They need to know if you have any of these conditions: -kidney disease -an unusual or allergic reaction to oxaliplatin, other chemotherapy, other medicines, foods, dyes, or preservatives -pregnant or trying to get pregnant -breast-feeding How should I use this medicine? This drug is given as an infusion into a vein. It is administered in a hospital or clinic by a specially trained health care professional. Talk to your pediatrician regarding the use of this medicine in children. Special care may be needed. Overdosage: If you think you have taken too much of this medicine contact a poison control center or emergency room at once. NOTE: This medicine is only for you. Do not share this medicine with others. What if I miss a dose? It is important not to miss a dose. Call your doctor or health care professional if you are unable to keep an appointment. What may interact with this medicine? -medicines to increase blood counts like filgrastim, pegfilgrastim, sargramostim -probenecid -some antibiotics like amikacin, gentamicin, neomycin, polymyxin B, streptomycin, tobramycin -zalcitabine Talk to your doctor or health care professional before taking any of these medicines: -acetaminophen -aspirin -ibuprofen -ketoprofen -naproxen This list may not describe all possible interactions. Give your health care provider a list of all the medicines, herbs, non-prescription drugs, or dietary supplements you use. Also tell them if  you smoke, drink alcohol, or use illegal drugs. Some items may interact with your medicine. What should I watch for while using this medicine? Your condition will be monitored carefully while you are receiving this medicine. You will need important blood work done while you are taking this medicine. This medicine can make you more sensitive to cold. Do not drink cold drinks or use ice. Cover exposed skin before coming in contact with cold temperatures or cold objects. When out in cold weather wear warm clothing and cover your mouth and nose to warm the air that goes into your lungs. Tell your doctor if you get sensitive to the cold. This drug may make you feel generally unwell. This is not uncommon, as chemotherapy can affect healthy cells as well as cancer cells. Report any side effects. Continue your course of treatment even though you feel ill unless your doctor tells you to stop. In some cases, you may be given additional medicines to help with side effects. Follow all directions for their use. Call your doctor or health care professional for advice if you get a fever, chills or sore throat, or other symptoms of a cold or flu. Do not treat yourself. This drug decreases your body's ability to fight infections. Try to avoid being around people who are sick. This medicine may increase your risk to bruise or bleed. Call your doctor or health care professional if you notice any unusual bleeding. Be careful brushing and flossing your teeth or using a toothpick because you may get an infection or bleed more easily. If you have any dental work done, tell your dentist you are receiving this medicine. Avoid taking products that contain aspirin, acetaminophen, ibuprofen, naproxen,   or ketoprofen unless instructed by your doctor. These medicines may hide a fever. Do not become pregnant while taking this medicine. Women should inform their doctor if they wish to become pregnant or think they might be pregnant. There  is a potential for serious side effects to an unborn child. Talk to your health care professional or pharmacist for more information. Do not breast-feed an infant while taking this medicine. Call your doctor or health care professional if you get diarrhea. Do not treat yourself. What side effects may I notice from receiving this medicine? Side effects that you should report to your doctor or health care professional as soon as possible: -allergic reactions like skin rash, itching or hives, swelling of the face, lips, or tongue -low blood counts - This drug may decrease the number of white blood cells, red blood cells and platelets. You may be at increased risk for infections and bleeding. -signs of infection - fever or chills, cough, sore throat, pain or difficulty passing urine -signs of decreased platelets or bleeding - bruising, pinpoint red spots on the skin, black, tarry stools, nosebleeds -signs of decreased red blood cells - unusually weak or tired, fainting spells, lightheadedness -breathing problems -chest pain, pressure -cough -diarrhea -jaw tightness -mouth sores -nausea and vomiting -pain, swelling, redness or irritation at the injection site -pain, tingling, numbness in the hands or feet -problems with balance, talking, walking -redness, blistering, peeling or loosening of the skin, including inside the mouth -trouble passing urine or change in the amount of urine Side effects that usually do not require medical attention (report to your doctor or health care professional if they continue or are bothersome): -changes in vision -constipation -hair loss -loss of appetite -metallic taste in the mouth or changes in taste -stomach pain This list may not describe all possible side effects. Call your doctor for medical advice about side effects. You may report side effects to FDA at 1-800-FDA-1088. Where should I keep my medicine? This drug is given in a hospital or clinic and  will not be stored at home. NOTE: This sheet is a summary. It may not cover all possible information. If you have questions about this medicine, talk to your doctor, pharmacist, or health care provider.  2015, [employer] Standard. (2007-10-08 17:22:47)  

## 2014-02-08 ENCOUNTER — Other Ambulatory Visit: Payer: Self-pay | Admitting: Oncology

## 2014-02-09 ENCOUNTER — Ambulatory Visit: Payer: 59

## 2014-02-10 ENCOUNTER — Ambulatory Visit: Payer: 59

## 2014-02-11 ENCOUNTER — Ambulatory Visit: Payer: 59

## 2014-02-12 ENCOUNTER — Ambulatory Visit: Payer: 59

## 2014-02-13 ENCOUNTER — Ambulatory Visit: Payer: 59

## 2014-02-13 ENCOUNTER — Ambulatory Visit (HOSPITAL_BASED_OUTPATIENT_CLINIC_OR_DEPARTMENT_OTHER): Payer: 59

## 2014-02-13 ENCOUNTER — Other Ambulatory Visit (HOSPITAL_BASED_OUTPATIENT_CLINIC_OR_DEPARTMENT_OTHER): Payer: 59

## 2014-02-13 DIAGNOSIS — C163 Malignant neoplasm of pyloric antrum: Secondary | ICD-10-CM | POA: Diagnosis not present

## 2014-02-13 DIAGNOSIS — Z5111 Encounter for antineoplastic chemotherapy: Secondary | ICD-10-CM | POA: Diagnosis not present

## 2014-02-13 DIAGNOSIS — C169 Malignant neoplasm of stomach, unspecified: Secondary | ICD-10-CM

## 2014-02-13 LAB — CBC WITH DIFFERENTIAL/PLATELET
BASO%: 0.2 % (ref 0.0–2.0)
BASOS ABS: 0 10*3/uL (ref 0.0–0.1)
EOS ABS: 0.1 10*3/uL (ref 0.0–0.5)
EOS%: 1 % (ref 0.0–7.0)
HCT: 33 % — ABNORMAL LOW (ref 34.8–46.6)
HGB: 11.2 g/dL — ABNORMAL LOW (ref 11.6–15.9)
LYMPH%: 31.4 % (ref 14.0–49.7)
MCH: 29.6 pg (ref 25.1–34.0)
MCHC: 33.9 g/dL (ref 31.5–36.0)
MCV: 87.1 fL (ref 79.5–101.0)
MONO#: 0.8 10*3/uL (ref 0.1–0.9)
MONO%: 16.7 % — ABNORMAL HIGH (ref 0.0–14.0)
NEUT%: 50.7 % (ref 38.4–76.8)
NEUTROS ABS: 2.4 10*3/uL (ref 1.5–6.5)
PLATELETS: 287 10*3/uL (ref 145–400)
RBC: 3.79 10*6/uL (ref 3.70–5.45)
RDW: 15.7 % — ABNORMAL HIGH (ref 11.2–14.5)
WBC: 4.8 10*3/uL (ref 3.9–10.3)
lymph#: 1.5 10*3/uL (ref 0.9–3.3)
nRBC: 0 % (ref 0–0)

## 2014-02-13 MED ORDER — SODIUM CHLORIDE 0.9 % IV SOLN
Freq: Once | INTRAVENOUS | Status: AC
  Administered 2014-02-13: 13:00:00 via INTRAVENOUS

## 2014-02-13 MED ORDER — HEPARIN SOD (PORK) LOCK FLUSH 100 UNIT/ML IV SOLN
500.0000 [IU] | Freq: Once | INTRAVENOUS | Status: AC | PRN
  Administered 2014-02-13: 500 [IU]
  Filled 2014-02-13: qty 5

## 2014-02-13 MED ORDER — SODIUM CHLORIDE 0.9 % IJ SOLN
10.0000 mL | INTRAMUSCULAR | Status: DC | PRN
  Administered 2014-02-13: 10 mL
  Filled 2014-02-13: qty 10

## 2014-02-13 MED ORDER — FLUOROURACIL CHEMO INJECTION 2.5 GM/50ML
300.0000 mg/m2 | Freq: Once | INTRAVENOUS | Status: AC
  Administered 2014-02-13: 550 mg via INTRAVENOUS
  Filled 2014-02-13: qty 11

## 2014-02-13 MED ORDER — LEUCOVORIN CALCIUM INJECTION 350 MG
300.0000 mg/m2 | Freq: Once | INTRAVENOUS | Status: AC
  Administered 2014-02-13: 538 mg via INTRAVENOUS
  Filled 2014-02-13: qty 26.9

## 2014-02-13 NOTE — Patient Instructions (Signed)
[facility] Discharge Instructions for Patients Receiving Chemotherapy  Today you received the following chemotherapy agents Leucovorin and 5FU.  To help prevent nausea and vomiting after your treatment, we encourage you to take your nausea medication.   If you develop nausea and vomiting that is not controlled by your nausea medication, call the clinic.   BELOW ARE SYMPTOMS THAT SHOULD BE REPORTED IMMEDIATELY:  *FEVER GREATER THAN 100.5 F  *CHILLS WITH OR WITHOUT FEVER  NAUSEA AND VOMITING THAT IS NOT CONTROLLED WITH YOUR NAUSEA MEDICATION  *UNUSUAL SHORTNESS OF BREATH  *UNUSUAL BRUISING OR BLEEDING  TENDERNESS IN MOUTH AND THROAT WITH OR WITHOUT PRESENCE OF ULCERS  *URINARY PROBLEMS  *BOWEL PROBLEMS  UNUSUAL RASH Items with * indicate a potential emergency and should be followed up as soon as possible.  Feel free to call the clinic you have any questions or concerns. The clinic phone number is [phone].    

## 2014-02-15 ENCOUNTER — Ambulatory Visit: Payer: 59

## 2014-02-15 ENCOUNTER — Other Ambulatory Visit: Payer: Self-pay | Admitting: Oncology

## 2014-02-16 ENCOUNTER — Ambulatory Visit: Payer: 59

## 2014-02-17 ENCOUNTER — Ambulatory Visit: Payer: 59

## 2014-02-18 ENCOUNTER — Ambulatory Visit: Payer: 59

## 2014-02-20 ENCOUNTER — Other Ambulatory Visit: Payer: Self-pay | Admitting: Oncology

## 2014-02-20 ENCOUNTER — Other Ambulatory Visit (HOSPITAL_BASED_OUTPATIENT_CLINIC_OR_DEPARTMENT_OTHER): Payer: 59

## 2014-02-20 ENCOUNTER — Ambulatory Visit: Payer: 59

## 2014-02-20 ENCOUNTER — Ambulatory Visit (HOSPITAL_BASED_OUTPATIENT_CLINIC_OR_DEPARTMENT_OTHER): Payer: 59

## 2014-02-20 DIAGNOSIS — Z5111 Encounter for antineoplastic chemotherapy: Secondary | ICD-10-CM | POA: Diagnosis not present

## 2014-02-20 DIAGNOSIS — C163 Malignant neoplasm of pyloric antrum: Secondary | ICD-10-CM

## 2014-02-20 DIAGNOSIS — C169 Malignant neoplasm of stomach, unspecified: Secondary | ICD-10-CM

## 2014-02-20 LAB — CBC WITH DIFFERENTIAL/PLATELET
BASO%: 0.3 % (ref 0.0–2.0)
Basophils Absolute: 0 10*3/uL (ref 0.0–0.1)
EOS ABS: 0.1 10*3/uL (ref 0.0–0.5)
EOS%: 1.5 % (ref 0.0–7.0)
HCT: 32.4 % — ABNORMAL LOW (ref 34.8–46.6)
HGB: 10.6 g/dL — ABNORMAL LOW (ref 11.6–15.9)
LYMPH#: 1.3 10*3/uL (ref 0.9–3.3)
LYMPH%: 33.9 % (ref 14.0–49.7)
MCH: 28.9 pg (ref 25.1–34.0)
MCHC: 32.7 g/dL (ref 31.5–36.0)
MCV: 88.3 fL (ref 79.5–101.0)
MONO#: 0.7 10*3/uL (ref 0.1–0.9)
MONO%: 17.2 % — ABNORMAL HIGH (ref 0.0–14.0)
NEUT%: 47.1 % (ref 38.4–76.8)
NEUTROS ABS: 1.9 10*3/uL (ref 1.5–6.5)
Platelets: 204 10*3/uL (ref 145–400)
RBC: 3.67 10*6/uL — ABNORMAL LOW (ref 3.70–5.45)
RDW: 15.4 % — ABNORMAL HIGH (ref 11.2–14.5)
WBC: 4 10*3/uL (ref 3.9–10.3)
nRBC: 0 % (ref 0–0)

## 2014-02-20 MED ORDER — ONDANSETRON 8 MG/50ML IVPB (CHCC): 8.0000 mg | Freq: Once | INTRAVENOUS | Status: DC

## 2014-02-20 MED ORDER — SODIUM CHLORIDE 0.9 % IJ SOLN
10.0000 mL | INTRAMUSCULAR | Status: DC | PRN
  Administered 2014-02-20: 10 mL
  Filled 2014-02-20: qty 10

## 2014-02-20 MED ORDER — FLUOROURACIL CHEMO INJECTION 2.5 GM/50ML
300.0000 mg/m2 | Freq: Once | INTRAVENOUS | Status: AC
  Administered 2014-02-20: 550 mg via INTRAVENOUS
  Filled 2014-02-20: qty 11

## 2014-02-20 MED ORDER — HEPARIN SOD (PORK) LOCK FLUSH 100 UNIT/ML IV SOLN
500.0000 [IU] | Freq: Once | INTRAVENOUS | Status: AC | PRN
  Administered 2014-02-20: 500 [IU]
  Filled 2014-02-20: qty 5

## 2014-02-20 MED ORDER — SODIUM CHLORIDE 0.9 % IV SOLN
Freq: Once | INTRAVENOUS | Status: AC
  Administered 2014-02-20: 09:00:00 via INTRAVENOUS

## 2014-02-20 MED ORDER — LEUCOVORIN CALCIUM INJECTION 350 MG
300.0000 mg/m2 | Freq: Once | INTRAMUSCULAR | Status: AC
  Administered 2014-02-20: 538 mg via INTRAVENOUS
  Filled 2014-02-20: qty 26.9

## 2014-02-20 NOTE — Patient Instructions (Signed)
[facility] Discharge Instructions for Patients Receiving Chemotherapy  Today you received the following chemotherapy agents: Leucovorin and Adrucil.  To help prevent nausea and vomiting after your treatment, we encourage you to take your nausea medication as prescribed.   If you develop nausea and vomiting that is not controlled by your nausea medication, call the clinic.   BELOW ARE SYMPTOMS THAT SHOULD BE REPORTED IMMEDIATELY:  *FEVER GREATER THAN 100.5 F  *CHILLS WITH OR WITHOUT FEVER  NAUSEA AND VOMITING THAT IS NOT CONTROLLED WITH YOUR NAUSEA MEDICATION  *UNUSUAL SHORTNESS OF BREATH  *UNUSUAL BRUISING OR BLEEDING  TENDERNESS IN MOUTH AND THROAT WITH OR WITHOUT PRESENCE OF ULCERS  *URINARY PROBLEMS  *BOWEL PROBLEMS  UNUSUAL RASH Items with * indicate a potential emergency and should be followed up as soon as possible.  Feel free to call the clinic you have any questions or concerns. The clinic phone number is [phone].    

## 2014-02-23 ENCOUNTER — Other Ambulatory Visit: Payer: Self-pay | Admitting: Obstetrics and Gynecology

## 2014-02-23 ENCOUNTER — Other Ambulatory Visit (HOSPITAL_COMMUNITY)
Admission: RE | Admit: 2014-02-23 | Discharge: 2014-02-23 | Disposition: A | Discharge status: Home / Self Care | Payer: 59 | Source: Ambulatory Visit | Attending: Obstetrics and Gynecology | Admitting: Obstetrics and Gynecology

## 2014-02-23 ENCOUNTER — Ambulatory Visit: Payer: 59

## 2014-02-23 DIAGNOSIS — Z01411 Encounter for gynecological examination (general) (routine) with abnormal findings: Secondary | ICD-10-CM | POA: Insufficient documentation

## 2014-02-23 DIAGNOSIS — Z1151 Encounter for screening for human papillomavirus (HPV): Secondary | ICD-10-CM | POA: Diagnosis present

## 2014-02-24 ENCOUNTER — Ambulatory Visit: Payer: 59

## 2014-02-25 ENCOUNTER — Ambulatory Visit: Payer: 59

## 2014-02-25 LAB — CYTOLOGY - PAP

## 2014-02-26 ENCOUNTER — Ambulatory Visit: Payer: 59

## 2014-02-27 ENCOUNTER — Telehealth: Payer: Self-pay | Admitting: Oncology

## 2014-02-27 ENCOUNTER — Ambulatory Visit (HOSPITAL_BASED_OUTPATIENT_CLINIC_OR_DEPARTMENT_OTHER): Payer: 59 | Admitting: Oncology

## 2014-02-27 ENCOUNTER — Other Ambulatory Visit (HOSPITAL_BASED_OUTPATIENT_CLINIC_OR_DEPARTMENT_OTHER): Payer: 59

## 2014-02-27 ENCOUNTER — Ambulatory Visit: Payer: 59

## 2014-02-27 ENCOUNTER — Ambulatory Visit (HOSPITAL_BASED_OUTPATIENT_CLINIC_OR_DEPARTMENT_OTHER): Payer: 59

## 2014-02-27 ENCOUNTER — Telehealth: Payer: Self-pay | Admitting: *Deleted

## 2014-02-27 VITALS — BP 113/63 | HR 90 | Temp 98.2°F | Resp 18 | Ht 66.0 in | Wt 157.2 lb

## 2014-02-27 DIAGNOSIS — Z95828 Presence of other vascular implants and grafts: Secondary | ICD-10-CM

## 2014-02-27 DIAGNOSIS — C163 Malignant neoplasm of pyloric antrum: Secondary | ICD-10-CM

## 2014-02-27 DIAGNOSIS — Z5111 Encounter for antineoplastic chemotherapy: Secondary | ICD-10-CM | POA: Diagnosis not present

## 2014-02-27 DIAGNOSIS — D709 Neutropenia, unspecified: Secondary | ICD-10-CM

## 2014-02-27 DIAGNOSIS — C169 Malignant neoplasm of stomach, unspecified: Secondary | ICD-10-CM

## 2014-02-27 DIAGNOSIS — D509 Iron deficiency anemia, unspecified: Secondary | ICD-10-CM | POA: Diagnosis not present

## 2014-02-27 LAB — COMPREHENSIVE METABOLIC PANEL (CC13)
ALT: 27 U/L (ref 0–55)
AST: 31 U/L (ref 5–34)
Albumin: 3.1 g/dL — ABNORMAL LOW (ref 3.5–5.0)
Alkaline Phosphatase: 74 U/L (ref 40–150)
Anion Gap: 9 mEq/L (ref 3–11)
BILIRUBIN TOTAL: 0.6 mg/dL (ref 0.20–1.20)
BUN: 5 mg/dL — AB (ref 7.0–26.0)
CO2: 23 mEq/L (ref 22–29)
Calcium: 8.9 mg/dL (ref 8.4–10.4)
Chloride: 107 mEq/L (ref 98–109)
Creatinine: 0.7 mg/dL (ref 0.6–1.1)
GLUCOSE: 81 mg/dL (ref 70–140)
Potassium: 3.8 mEq/L (ref 3.5–5.1)
Sodium: 139 mEq/L (ref 136–145)
Total Protein: 7.6 g/dL (ref 6.4–8.3)

## 2014-02-27 LAB — CBC WITH DIFFERENTIAL/PLATELET
BASO%: 0.3 % (ref 0.0–2.0)
BASOS ABS: 0 10*3/uL (ref 0.0–0.1)
EOS%: 1.8 % (ref 0.0–7.0)
Eosinophils Absolute: 0.1 10*3/uL (ref 0.0–0.5)
HCT: 31.6 % — ABNORMAL LOW (ref 34.8–46.6)
HEMOGLOBIN: 10.6 g/dL — AB (ref 11.6–15.9)
LYMPH#: 1.5 10*3/uL (ref 0.9–3.3)
LYMPH%: 43.2 % (ref 14.0–49.7)
MCH: 29.6 pg (ref 25.1–34.0)
MCHC: 33.5 g/dL (ref 31.5–36.0)
MCV: 88.3 fL (ref 79.5–101.0)
MONO#: 0.6 10*3/uL (ref 0.1–0.9)
MONO%: 17.9 % — AB (ref 0.0–14.0)
NEUT#: 1.3 10*3/uL — ABNORMAL LOW (ref 1.5–6.5)
NEUT%: 36.8 % — AB (ref 38.4–76.8)
Platelets: 246 10*3/uL (ref 145–400)
RBC: 3.58 10*6/uL — ABNORMAL LOW (ref 3.70–5.45)
RDW: 15 % — AB (ref 11.2–14.5)
WBC: 3.4 10*3/uL — ABNORMAL LOW (ref 3.9–10.3)
nRBC: 0 % (ref 0–0)

## 2014-02-27 MED ORDER — SODIUM CHLORIDE 0.9 % IV SOLN
Freq: Once | INTRAVENOUS | Status: AC
  Administered 2014-02-27: 11:00:00 via INTRAVENOUS

## 2014-02-27 MED ORDER — ONDANSETRON 8 MG/50ML IVPB (CHCC): 8.0000 mg | Freq: Once | INTRAVENOUS | Status: DC

## 2014-02-27 MED ORDER — FLUOROURACIL CHEMO INJECTION 2.5 GM/50ML
300.0000 mg/m2 | Freq: Once | INTRAVENOUS | Status: AC
Start: 2014-02-27 — End: 2014-02-27
  Administered 2014-02-27: 550 mg via INTRAVENOUS
  Filled 2014-02-27: qty 11

## 2014-02-27 MED ORDER — LEUCOVORIN CALCIUM INJECTION 350 MG
300.0000 mg/m2 | Freq: Once | INTRAVENOUS | Status: AC
  Administered 2014-02-27: 538 mg via INTRAVENOUS
  Filled 2014-02-27: qty 26.9

## 2014-02-27 MED ORDER — HEPARIN SOD (PORK) LOCK FLUSH 100 UNIT/ML IV SOLN
500.0000 [IU] | Freq: Once | INTRAVENOUS | Status: AC | PRN
  Administered 2014-02-27: 500 [IU]
  Filled 2014-02-27: qty 5

## 2014-02-27 MED ORDER — SODIUM CHLORIDE 0.9 % IJ SOLN
10.0000 mL | INTRAMUSCULAR | Status: DC | PRN
  Administered 2014-02-27: 10 mL
  Filled 2014-02-27: qty 10

## 2014-02-27 MED ORDER — SODIUM CHLORIDE 0.9 % IJ SOLN
10.0000 mL | INTRAMUSCULAR | Status: DC | PRN
  Administered 2014-02-27: 10 mL via INTRAVENOUS
  Filled 2014-02-27: qty 10

## 2014-02-27 NOTE — Telephone Encounter (Signed)
Per staff message and POF I have scheduled appts. Advised scheduler of appts. JMW Per staff message and POF I have scheduled appts. Advised scheduler of appts. JMW  

## 2014-02-27 NOTE — Patient Instructions (Signed)

## 2014-02-27 NOTE — Telephone Encounter (Signed)
Labs/ov per 12/04 POF, sent msg to add 5-FU and cancel chemo, pt is to p/u new sch at next visit on 12/11..... KJ

## 2014-02-27 NOTE — Progress Notes (Signed)
[place] OFFICE PROGRESS NOTE   Diagnosis: Gastric cancer  INTERVAL HISTORY:   She returns as scheduled. She resumed 5-FU/leucovorin on 02/13/2014. She tolerated the chemotherapy well. No mouth sores, diarrhea, or hand/foot pain. Good appetite.  Objective:  Vital signs in last 24 hours:  Blood pressure 113/63, pulse 90, temperature 98.2 F (36.8 C), temperature source Oral, resp. rate 18, height 5\' 6"  (1.676 m), weight 157 lb 3.2 oz (71.305 kg).    HEENT: No thrush or ulcers Resp: Lungs clear bilaterally Cardio: Regular rate and rhythm GI: No hepatomegaly, nontender, no mass Vascular: No leg edema  Skin: Dryness with hyperpigmentation of the upper back. Mild hyperpigmentation of the hands and soles. No skin breakdown.   Portacath/PICC-without erythema  Lab Results:  Lab Results  Component Value Date   WBC 3.4* 02/27/2014   HGB 10.6* 02/27/2014   HCT 31.6* 02/27/2014   MCV 88.3 02/27/2014   PLT 246 02/27/2014   NEUTROABS 1.3* 02/27/2014    Medications: I have reviewed the patient's current medications.  Assessment/Plan: 1. Gastric cancer status post upper endoscopy 09/22/2013 with findings of a partially obstructing oozing cratered gastric ulcer in the gastric antrum.  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   CT scans chest/abdomen/pelvis 10/03/2013 showed a 7 x 5 mm groundglass opacity in the superior segment of the right lower lobe; a 4.5 mm nodular density in the right upper lobe; bilateral axillary adenopathy; low density area anteriorly in the left hepatic lobe most consistent with fatty infiltration; another hypodense area within the medial segment of the left hepatic lobe; severe gastric distention; mild bilateral inguinal adenopathy.   Subtotal gastrectomy and lymph node dissection with creation of a gastrojejunostomy 10/10/2013. No evidence of distant metastatic disease noted at the time of surgery.  No evidence of liver metastases. 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 Tumor involved the serosal surface. Resection margins were negative. 10 of 17 lymph nodes were positive for metastatic adenocarcinoma.   Cycle 1 weekly 5-FU/leucovorin 11/14/2013, dose reduced beginning with week 3 secondary to mucositis and hand/foot syndrome.   Cycle 2 weekly 5-FU/leucovorin beginning 12/19/2013.  Initiation of radiation/Xeloda 01/26/2014. She decided to discontinue further radiation/Xeloda after one fraction due to significant nausea/vomiting.  Cycle 3 weekly 5-FU/leucovorin beginning 02/13/2014 2. Gastric outlet obstruction secondary to #1. Resolved. 3. Nausea/vomiting secondary to #2. Resolved. 4. Abdominal pain secondary to #1 and #2. Improved. 5. History of Weight loss secondary to #1 and #2. 6. Microcytic anemia, likely iron deficiency. She received iron dextran 10/11/2013. Improved. 7. Lupus/Sjgren's. 8. Pneumonia during hospitalization July 2015.  9. Abdominal abscesses with body fluid culture 10/20/2013 showing moderate Candida tropicalis. She was discharged home on a 4 week course of fluconazole.  10. Mucositis and hand/foot syndrome following cycle 1-week #3 5-FU/leucovorin, the 5-FU and leucovorin were dose reduced. 11. 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 12. Status post LEEP 11/07/2013. Pathology shows moderate to severe dysplasia.   Disposition:  She appears to be tolerating the 5-FU/leucovorin well. The plan is to proceed with week 3 of cycle 3 today. She has mild neutropenia. This may be secondary to the 5-fluorouracil versus an autoimmune syndrome. She knows to seek medical attention for symptoms of an infection.  She declines further radiation. We discussed the indication for oxaliplatin-based therapy.I recommend adjuvant FOLFOX chemotherapy. We discussed the potential  toxicities associated with oxaliplatin. She declines FOLFOX chemotherapy. She states she will decline oxaliplatin despite the potential increase  in the cure rate with the addition of this agent.  She will complete cycle 3 of adjuvant 5-FU/leucovorin on 03/06/2014. She will be scheduled for an office visit prior to cycle 4 on 03/18/2014.  Betsy Coder, MD  02/27/2014  5:46 PM

## 2014-02-27 NOTE — Patient Instructions (Signed)
[place] Discharge Instructions for Patients Receiving Chemotherapy  Today you received the following chemotherapy agents 5FU and Leucovorin  To help prevent nausea and vomiting after your treatment, we encourage you to take your nausea medication as needed   If you develop nausea and vomiting that is not controlled by your nausea medication, call the clinic.   BELOW ARE SYMPTOMS THAT SHOULD BE REPORTED IMMEDIATELY:  *FEVER GREATER THAN 100.5 F  *CHILLS WITH OR WITHOUT FEVER  NAUSEA AND VOMITING THAT IS NOT CONTROLLED WITH YOUR NAUSEA MEDICATION  *UNUSUAL SHORTNESS OF BREATH  *UNUSUAL BRUISING OR BLEEDING  TENDERNESS IN MOUTH AND THROAT WITH OR WITHOUT PRESENCE OF ULCERS  *URINARY PROBLEMS  *BOWEL PROBLEMS  UNUSUAL RASH Items with * indicate a potential emergency and should be followed up as soon as possible.  Feel free to call the clinic you have any questions or concerns. The clinic phone number is [phone].

## 2014-03-01 ENCOUNTER — Other Ambulatory Visit: Payer: Self-pay | Admitting: Oncology

## 2014-03-01 NOTE — Progress Notes (Signed)
  Radiation Oncology         [phone] ________________________________  Name: Andrea Best MRN: [id number]  Date: 01/16/2014  DOB: November 23, 1970  SIMULATION AND TREATMENT PLANNING NOTE  DIAGNOSIS:  Gastric cancer  Site:  Abdomen, postoperative region  NARRATIVE:  The patient was brought to the [place] suite.  Identity was confirmed.  All relevant records and images related to the planned course of therapy were reviewed.   Written consent to proceed with treatment was confirmed which was freely given after reviewing the details related to the planned course of therapy had been reviewed with the patient.  Then, the patient was set-up in a stable reproducible  supine position for radiation therapy.  CT images were obtained.  Surface markings were placed.    Medically necessary complex treatment device(s) for immobilization:  Customized vac lock bag.   The CT images were loaded into the planning software.  Then the target and avoidance structures were contoured.  Treatment planning then occurred.  The radiation prescription was entered and confirmed.  A total of 7 complex treatment devices were fabricated which relate to the designed radiation treatment fields. Each of these customized fields/ complex treatment devices will be used on a daily basis during the radiation course. I have requested : 3D Simulation  I have requested a DVH of the following structures: Target volume, spinal cord, kidneys, liver.   PLAN:  The patient will receive 45 Gy in 25 fractions initially. She will then receive a 5.4 gray boost to a coned-down target volume. The patient's anticipated final dose will be 50.4 gray.  ________________________________   Jodelle Gross, MD, PhD

## 2014-03-01 NOTE — Addendum Note (Signed)
Encounter addended by: Marye Round, MD on: 03/01/2014  9:24 PM<BR>     Documentation filed: Notes Section, Visit Diagnoses

## 2014-03-01 NOTE — Progress Notes (Signed)
  Radiation Oncology         [phone] ________________________________  Name: Andrea Best MRN: [id number]  Date: 01/29/2014  DOB: 07-29-1970  End of Treatment Note  Diagnosis:   Gastric cancer     Indication for treatment:  Curative       Radiation treatment dates:   01/26/2014  Site/dose:   Postoperative region within the abdomen was to be targeted to a dose of 50.4 gray including a 5.4 gray boost. This was to be carried out using a 3-D conformal technique. Ultimately, the patient received one fraction totaling 1.8 gray.  Narrative: The patient stated that she did not tolerate the first fraction of radiation treatment. We discussed making changes and managing her complaints but the patient was steadfast in her desire to discontinue treatment.  Plan: The patient has not been scheduled for any follow-up visits as she only received one fraction of radiation. I would be happy to see the patient in the future if we can be of any further assistance. ________________________________  Jodelle Gross, M.D., Ph.D.

## 2014-03-02 ENCOUNTER — Ambulatory Visit: Payer: 59

## 2014-03-03 ENCOUNTER — Ambulatory Visit: Payer: 59

## 2014-03-04 ENCOUNTER — Ambulatory Visit: Payer: 59

## 2014-03-05 ENCOUNTER — Ambulatory Visit: Payer: 59

## 2014-03-06 ENCOUNTER — Ambulatory Visit (HOSPITAL_BASED_OUTPATIENT_CLINIC_OR_DEPARTMENT_OTHER): Payer: 59

## 2014-03-06 ENCOUNTER — Ambulatory Visit: Payer: 59

## 2014-03-06 ENCOUNTER — Other Ambulatory Visit (HOSPITAL_BASED_OUTPATIENT_CLINIC_OR_DEPARTMENT_OTHER): Payer: 59

## 2014-03-06 DIAGNOSIS — C163 Malignant neoplasm of pyloric antrum: Secondary | ICD-10-CM

## 2014-03-06 DIAGNOSIS — Z5111 Encounter for antineoplastic chemotherapy: Secondary | ICD-10-CM

## 2014-03-06 DIAGNOSIS — C169 Malignant neoplasm of stomach, unspecified: Secondary | ICD-10-CM

## 2014-03-06 LAB — CBC WITH DIFFERENTIAL/PLATELET
BASO%: 0.2 % (ref 0.0–2.0)
Basophils Absolute: 0 10*3/uL (ref 0.0–0.1)
EOS%: 1.2 % (ref 0.0–7.0)
Eosinophils Absolute: 0.1 10*3/uL (ref 0.0–0.5)
HCT: 31.3 % — ABNORMAL LOW (ref 34.8–46.6)
HGB: 10.4 g/dL — ABNORMAL LOW (ref 11.6–15.9)
LYMPH%: 39.4 % (ref 14.0–49.7)
MCH: 29.5 pg (ref 25.1–34.0)
MCHC: 33.2 g/dL (ref 31.5–36.0)
MCV: 88.9 fL (ref 79.5–101.0)
MONO#: 0.9 10*3/uL (ref 0.1–0.9)
MONO%: 22.5 % — AB (ref 0.0–14.0)
NEUT#: 1.5 10*3/uL (ref 1.5–6.5)
NEUT%: 36.7 % — ABNORMAL LOW (ref 38.4–76.8)
NRBC: 0 % (ref 0–0)
Platelets: 251 10*3/uL (ref 145–400)
RBC: 3.52 10*6/uL — ABNORMAL LOW (ref 3.70–5.45)
RDW: 15.2 % — ABNORMAL HIGH (ref 11.2–14.5)
WBC: 4 10*3/uL (ref 3.9–10.3)
lymph#: 1.6 10*3/uL (ref 0.9–3.3)

## 2014-03-06 MED ORDER — LEUCOVORIN CALCIUM INJECTION 350 MG
300.0000 mg/m2 | Freq: Once | INTRAVENOUS | Status: AC
  Administered 2014-03-06: 538 mg via INTRAVENOUS
  Filled 2014-03-06: qty 26.9

## 2014-03-06 MED ORDER — HEPARIN SOD (PORK) LOCK FLUSH 100 UNIT/ML IV SOLN
500.0000 [IU] | Freq: Once | INTRAVENOUS | Status: AC | PRN
  Administered 2014-03-06: 500 [IU]
  Filled 2014-03-06: qty 5

## 2014-03-06 MED ORDER — FLUOROURACIL CHEMO INJECTION 2.5 GM/50ML
300.0000 mg/m2 | Freq: Once | INTRAVENOUS | Status: AC
  Administered 2014-03-06: 550 mg via INTRAVENOUS
  Filled 2014-03-06: qty 11

## 2014-03-06 MED ORDER — SODIUM CHLORIDE 0.9 % IJ SOLN
10.0000 mL | INTRAMUSCULAR | Status: DC | PRN
  Administered 2014-03-06: 10 mL
  Filled 2014-03-06: qty 10

## 2014-03-06 MED ORDER — ONDANSETRON 8 MG/50ML IVPB (CHCC): 8.0000 mg | Freq: Once | INTRAVENOUS | Status: DC

## 2014-03-06 MED ORDER — SODIUM CHLORIDE 0.9 % IV SOLN
Freq: Once | INTRAVENOUS | Status: AC
  Administered 2014-03-06: 09:00:00 via INTRAVENOUS

## 2014-03-06 NOTE — Patient Instructions (Signed)
[facility] Discharge Instructions for Patients Receiving Chemotherapy  Today you received the following chemotherapy agents leucovorin/fluorouracil.    To help prevent nausea and vomiting after your treatment, we encourage you to take your nausea medication as directed.     If you develop nausea and vomiting that is not controlled by your nausea medication, call the clinic.   BELOW ARE SYMPTOMS THAT SHOULD BE REPORTED IMMEDIATELY:  *FEVER GREATER THAN 100.5 F  *CHILLS WITH OR WITHOUT FEVER  NAUSEA AND VOMITING THAT IS NOT CONTROLLED WITH YOUR NAUSEA MEDICATION  *UNUSUAL SHORTNESS OF BREATH  *UNUSUAL BRUISING OR BLEEDING  TENDERNESS IN MOUTH AND THROAT WITH OR WITHOUT PRESENCE OF ULCERS  *URINARY PROBLEMS  *BOWEL PROBLEMS  UNUSUAL RASH Items with * indicate a potential emergency and should be followed up as soon as possible.  Feel free to call the clinic you have any questions or concerns. The clinic phone number is [phone].  

## 2014-03-09 ENCOUNTER — Ambulatory Visit: Payer: 59

## 2014-03-13 ENCOUNTER — Ambulatory Visit: Payer: 59

## 2014-03-13 ENCOUNTER — Other Ambulatory Visit: Payer: 59

## 2014-03-15 ENCOUNTER — Other Ambulatory Visit: Payer: Self-pay | Admitting: Oncology

## 2014-03-18 ENCOUNTER — Ambulatory Visit (HOSPITAL_BASED_OUTPATIENT_CLINIC_OR_DEPARTMENT_OTHER): Payer: 59 | Admitting: Nurse Practitioner

## 2014-03-18 ENCOUNTER — Telehealth: Payer: Self-pay | Admitting: *Deleted

## 2014-03-18 ENCOUNTER — Ambulatory Visit (HOSPITAL_BASED_OUTPATIENT_CLINIC_OR_DEPARTMENT_OTHER): Payer: 59 | Admitting: Lab

## 2014-03-18 ENCOUNTER — Ambulatory Visit (HOSPITAL_BASED_OUTPATIENT_CLINIC_OR_DEPARTMENT_OTHER): Payer: 59

## 2014-03-18 ENCOUNTER — Other Ambulatory Visit: Payer: Self-pay | Admitting: Nurse Practitioner

## 2014-03-18 ENCOUNTER — Other Ambulatory Visit: Payer: Self-pay | Admitting: *Deleted

## 2014-03-18 VITALS — BP 115/52 | HR 98 | Temp 98.5°F | Resp 20 | Ht 66.0 in | Wt 155.7 lb

## 2014-03-18 DIAGNOSIS — C169 Malignant neoplasm of stomach, unspecified: Secondary | ICD-10-CM

## 2014-03-18 DIAGNOSIS — Z5111 Encounter for antineoplastic chemotherapy: Secondary | ICD-10-CM | POA: Diagnosis not present

## 2014-03-18 DIAGNOSIS — Z95828 Presence of other vascular implants and grafts: Secondary | ICD-10-CM

## 2014-03-18 LAB — CBC WITH DIFFERENTIAL/PLATELET
BASO%: 0.6 % (ref 0.0–2.0)
BASOS ABS: 0 10*3/uL (ref 0.0–0.1)
EOS%: 1.2 % (ref 0.0–7.0)
Eosinophils Absolute: 0 10*3/uL (ref 0.0–0.5)
HCT: 31.5 % — ABNORMAL LOW (ref 34.8–46.6)
HGB: 10.7 g/dL — ABNORMAL LOW (ref 11.6–15.9)
LYMPH%: 35 % (ref 14.0–49.7)
MCH: 30.1 pg (ref 25.1–34.0)
MCHC: 34 g/dL (ref 31.5–36.0)
MCV: 88.7 fL (ref 79.5–101.0)
MONO#: 0.5 10*3/uL (ref 0.1–0.9)
MONO%: 14.3 % — AB (ref 0.0–14.0)
NEUT#: 1.7 10*3/uL (ref 1.5–6.5)
NEUT%: 48.9 % (ref 38.4–76.8)
NRBC: 0 % (ref 0–0)
PLATELETS: 202 10*3/uL (ref 145–400)
RBC: 3.55 10*6/uL — AB (ref 3.70–5.45)
RDW: 14.2 % (ref 11.2–14.5)
WBC: 3.4 10*3/uL — ABNORMAL LOW (ref 3.9–10.3)
lymph#: 1.2 10*3/uL (ref 0.9–3.3)

## 2014-03-18 MED ORDER — ONDANSETRON 8 MG/50ML IVPB (CHCC): 8.0000 mg | Freq: Once | INTRAVENOUS | Status: DC

## 2014-03-18 MED ORDER — FLUOROURACIL CHEMO INJECTION 2.5 GM/50ML
300.0000 mg/m2 | Freq: Once | INTRAVENOUS | Status: AC
  Administered 2014-03-18: 550 mg via INTRAVENOUS
  Filled 2014-03-18: qty 11

## 2014-03-18 MED ORDER — SODIUM CHLORIDE 0.9 % IV SOLN
Freq: Once | INTRAVENOUS | Status: AC
  Administered 2014-03-18: 11:00:00 via INTRAVENOUS

## 2014-03-18 MED ORDER — LEUCOVORIN CALCIUM INJECTION 350 MG
300.0000 mg/m2 | Freq: Once | INTRAVENOUS | Status: AC
  Administered 2014-03-18: 538 mg via INTRAVENOUS
  Filled 2014-03-18: qty 26.9

## 2014-03-18 MED ORDER — HEPARIN SOD (PORK) LOCK FLUSH 100 UNIT/ML IV SOLN
500.0000 [IU] | Freq: Once | INTRAVENOUS | Status: AC | PRN
  Administered 2014-03-18: 500 [IU]
  Filled 2014-03-18: qty 5

## 2014-03-18 MED ORDER — ONDANSETRON 8 MG/NS 50 ML IVPB
INTRAVENOUS | Status: AC
  Filled 2014-03-18: qty 8

## 2014-03-18 MED ORDER — LIDOCAINE-PRILOCAINE 2.5-2.5 % EX CREA: TOPICAL_CREAM | CUTANEOUS | Status: DC

## 2014-03-18 MED ORDER — SODIUM CHLORIDE 0.9 % IJ SOLN
10.0000 mL | INTRAMUSCULAR | Status: DC | PRN
  Administered 2014-03-18: 10 mL
  Filled 2014-03-18: qty 10

## 2014-03-18 NOTE — Telephone Encounter (Signed)
Per staff message and POF I have scheduled appts. Advised scheduler of appts. JMW  

## 2014-03-18 NOTE — Progress Notes (Signed)
Okay to treat today with CMET from 02/27/14 per Dr. Benay Spice.

## 2014-03-18 NOTE — Patient Instructions (Signed)
[facility] Discharge Instructions for Patients Receiving Chemotherapy  Today you received the following chemotherapy agents: Leucovorin and Adrucil.  To help prevent nausea and vomiting after your treatment, we encourage you to take your nausea medication as prescribed.   If you develop nausea and vomiting that is not controlled by your nausea medication, call the clinic.   BELOW ARE SYMPTOMS THAT SHOULD BE REPORTED IMMEDIATELY:  *FEVER GREATER THAN 100.5 F  *CHILLS WITH OR WITHOUT FEVER  NAUSEA AND VOMITING THAT IS NOT CONTROLLED WITH YOUR NAUSEA MEDICATION  *UNUSUAL SHORTNESS OF BREATH  *UNUSUAL BRUISING OR BLEEDING  TENDERNESS IN MOUTH AND THROAT WITH OR WITHOUT PRESENCE OF ULCERS  *URINARY PROBLEMS  *BOWEL PROBLEMS  UNUSUAL RASH Items with * indicate a potential emergency and should be followed up as soon as possible.  Feel free to call the clinic you have any questions or concerns. The clinic phone number is [phone].    

## 2014-03-18 NOTE — Progress Notes (Signed)
[facility] OFFICE PROGRESS NOTE   Diagnosis:  Gastric cancer  INTERVAL HISTORY:   She returns as scheduled. She overall feels well. She has a good appetite. She denies any nausea or vomiting. No mouth sores. No diarrhea. She notes hyperpigmentation over the palms. No hand or foot pain or redness.  Objective:  Vital signs in last 24 hours:  Blood pressure 115/52, pulse 98, temperature 98.5 F (36.9 C), temperature source Oral, resp. rate 20, height 5\' 6"  (1.676 m), weight 155 lb 11.2 oz (70.625 kg), SpO2 100 %.    HEENT: No thrush or ulcers. Resp: Lungs clear bilaterally. Cardio: Regular rate and rhythm. GI: Abdomen soft and nontender. No hepatomegaly. No mass. Vascular: No leg edema.  Skin: Palms with hyperpigmentation. No erythema.  Port-A-Cath without erythema.    Lab Results:  Lab Results  Component Value Date   WBC 3.4* 03/18/2014   HGB 10.7* 03/18/2014   HCT 31.5* 03/18/2014   MCV 88.7 03/18/2014   PLT 202 03/18/2014   NEUTROABS 1.7 03/18/2014    Imaging:  No results found.  Medications: I have reviewed the patient's current medications.  Assessment/Plan: 1. Gastric cancer status post upper endoscopy 09/22/2013 with findings of a partially obstructing oozing cratered gastric ulcer in the gastric antrum.  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   CT scans chest/abdomen/pelvis 10/03/2013 showed a 7 x 5 mm groundglass opacity in the superior segment of the right lower lobe; a 4.5 mm nodular density in the right upper lobe; bilateral axillary adenopathy; low density area anteriorly in the left hepatic lobe most consistent with fatty infiltration; another hypodense area within the medial segment of the left hepatic lobe; severe gastric distention; mild bilateral inguinal adenopathy.   Subtotal gastrectomy and lymph node dissection with creation of a gastrojejunostomy 10/10/2013. No evidence of distant  metastatic disease noted at the time of surgery. No evidence of liver metastases. 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 Tumor involved the serosal surface. Resection margins were negative. 10 of 17 lymph nodes were positive for metastatic adenocarcinoma.   Cycle 1 weekly 5-FU/leucovorin 11/14/2013, dose reduced beginning with week 3 secondary to mucositis and hand/foot syndrome.   Cycle 2 weekly 5-FU/leucovorin beginning 12/19/2013.  Initiation of radiation/Xeloda 01/26/2014. She decided to discontinue further radiation/Xeloda after one fraction due to significant nausea/vomiting.  Cycle 3 weekly 5-FU/leucovorin beginning 02/13/2014  Cycle 4 weekly 5-FU/leucovorin beginning 03/18/2014 2. Gastric outlet obstruction secondary to #1. Resolved. 3. Nausea/vomiting secondary to #2. Resolved. 4. Abdominal pain secondary to #1 and #2. Improved. 5. History of Weight loss secondary to #1 and #2. 6. Microcytic anemia, likely iron deficiency. She received iron dextran 10/11/2013. Improved. 7. Lupus/Sjgren's. 8. Pneumonia during hospitalization July 2015.  9. Abdominal abscesses with body fluid culture 10/20/2013 showing moderate Candida tropicalis. She was discharged home on a 4 week course of fluconazole.  10. Mucositis and hand/foot syndrome following cycle 1-week #3 5-FU/leucovorin, the 5-FU and leucovorin were dose reduced. 11. 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 12. Status post LEEP 11/07/2013. Pathology shows moderate to severe dysplasia.   Disposition: Ms. Revell appears stable. She is tolerating treatment well. Plan to proceed with cycle 4 weekly 5-FU/leucovorin today as scheduled. We reviewed today that Dr. Benay Spice recommends 5 cycles of weekly 5-FU/leucovorin. She is agreeable with this plan. She will return for a follow-up visit on 04/10/2013.   Ned Card ANP/GNP-BC    03/18/2014  10:24 AM

## 2014-03-25 ENCOUNTER — Ambulatory Visit (HOSPITAL_BASED_OUTPATIENT_CLINIC_OR_DEPARTMENT_OTHER): Payer: 59

## 2014-03-25 ENCOUNTER — Other Ambulatory Visit (HOSPITAL_BASED_OUTPATIENT_CLINIC_OR_DEPARTMENT_OTHER): Payer: 59

## 2014-03-25 DIAGNOSIS — Z5111 Encounter for antineoplastic chemotherapy: Secondary | ICD-10-CM

## 2014-03-25 DIAGNOSIS — C169 Malignant neoplasm of stomach, unspecified: Secondary | ICD-10-CM

## 2014-03-25 LAB — CBC WITH DIFFERENTIAL/PLATELET
BASO%: 0.5 % (ref 0.0–2.0)
BASOS ABS: 0 10*3/uL (ref 0.0–0.1)
EOS ABS: 0 10*3/uL (ref 0.0–0.5)
EOS%: 1.3 % (ref 0.0–7.0)
HCT: 33.3 % — ABNORMAL LOW (ref 34.8–46.6)
HGB: 10.9 g/dL — ABNORMAL LOW (ref 11.6–15.9)
LYMPH#: 1.2 10*3/uL (ref 0.9–3.3)
LYMPH%: 34.8 % (ref 14.0–49.7)
MCH: 30.2 pg (ref 25.1–34.0)
MCHC: 32.7 g/dL (ref 31.5–36.0)
MCV: 92.2 fL (ref 79.5–101.0)
MONO#: 0.8 10*3/uL (ref 0.1–0.9)
MONO%: 21.6 % — ABNORMAL HIGH (ref 0.0–14.0)
NEUT%: 41.8 % (ref 38.4–76.8)
NEUTROS ABS: 1.5 10*3/uL (ref 1.5–6.5)
Platelets: 246 10*3/uL (ref 145–400)
RBC: 3.62 10*6/uL — AB (ref 3.70–5.45)
RDW: 14.3 % (ref 11.2–14.5)
WBC: 3.6 10*3/uL — ABNORMAL LOW (ref 3.9–10.3)

## 2014-03-25 LAB — COMPREHENSIVE METABOLIC PANEL (CC13)
ALT: 19 U/L (ref 0–55)
ANION GAP: 5 meq/L (ref 3–11)
AST: 26 U/L (ref 5–34)
Albumin: 3.4 g/dL — ABNORMAL LOW (ref 3.5–5.0)
Alkaline Phosphatase: 55 U/L (ref 40–150)
BILIRUBIN TOTAL: 0.63 mg/dL (ref 0.20–1.20)
BUN: 6.6 mg/dL — ABNORMAL LOW (ref 7.0–26.0)
CALCIUM: 8.9 mg/dL (ref 8.4–10.4)
CO2: 28 meq/L (ref 22–29)
Chloride: 108 mEq/L (ref 98–109)
Creatinine: 0.7 mg/dL (ref 0.6–1.1)
EGFR: >90 mL/min/{1.73_m2} (ref >90)
GLUCOSE: 73 mg/dL (ref 70–140)
Potassium: 4.5 mEq/L (ref 3.5–5.1)
SODIUM: 140 meq/L (ref 136–145)
TOTAL PROTEIN: 8 g/dL (ref 6.4–8.3)

## 2014-03-25 MED ORDER — LEUCOVORIN CALCIUM INJECTION 350 MG
300.0000 mg/m2 | Freq: Once | INTRAMUSCULAR | Status: AC
  Administered 2014-03-25: 538 mg via INTRAVENOUS
  Filled 2014-03-25: qty 26.9

## 2014-03-25 MED ORDER — SODIUM CHLORIDE 0.9 % IV SOLN
Freq: Once | INTRAVENOUS | Status: AC
  Administered 2014-03-25: 11:00:00 via INTRAVENOUS

## 2014-03-25 MED ORDER — ONDANSETRON 8 MG/50ML IVPB (CHCC): 8.0000 mg | Freq: Once | INTRAVENOUS | Status: DC

## 2014-03-25 MED ORDER — SODIUM CHLORIDE 0.9 % IJ SOLN
10.0000 mL | INTRAMUSCULAR | Status: DC | PRN
  Administered 2014-03-25: 10 mL
  Filled 2014-03-25: qty 10

## 2014-03-25 MED ORDER — FLUOROURACIL CHEMO INJECTION 2.5 GM/50ML
300.0000 mg/m2 | Freq: Once | INTRAVENOUS | Status: AC
  Administered 2014-03-25: 550 mg via INTRAVENOUS
  Filled 2014-03-25: qty 11

## 2014-03-25 MED ORDER — HEPARIN SOD (PORK) LOCK FLUSH 100 UNIT/ML IV SOLN
500.0000 [IU] | Freq: Once | INTRAVENOUS | Status: AC | PRN
  Administered 2014-03-25: 500 [IU]
  Filled 2014-03-25: qty 5

## 2014-03-25 NOTE — Patient Instructions (Signed)
[facility] Discharge Instructions for Patients Receiving Chemotherapy  Today you received the following chemotherapy agents: Leucovorin and Adrucil.  To help prevent nausea and vomiting after your treatment, we encourage you to take your nausea medication as prescribed.   If you develop nausea and vomiting that is not controlled by your nausea medication, call the clinic.   BELOW ARE SYMPTOMS THAT SHOULD BE REPORTED IMMEDIATELY:  *FEVER GREATER THAN 100.5 F  *CHILLS WITH OR WITHOUT FEVER  NAUSEA AND VOMITING THAT IS NOT CONTROLLED WITH YOUR NAUSEA MEDICATION  *UNUSUAL SHORTNESS OF BREATH  *UNUSUAL BRUISING OR BLEEDING  TENDERNESS IN MOUTH AND THROAT WITH OR WITHOUT PRESENCE OF ULCERS  *URINARY PROBLEMS  *BOWEL PROBLEMS  UNUSUAL RASH Items with * indicate a potential emergency and should be followed up as soon as possible.  Feel free to call the clinic you have any questions or concerns. The clinic phone number is [phone].    

## 2014-04-02 ENCOUNTER — Other Ambulatory Visit: Payer: Self-pay | Admitting: *Deleted

## 2014-04-03 ENCOUNTER — Ambulatory Visit (HOSPITAL_BASED_OUTPATIENT_CLINIC_OR_DEPARTMENT_OTHER): Payer: 59

## 2014-04-03 ENCOUNTER — Other Ambulatory Visit (HOSPITAL_BASED_OUTPATIENT_CLINIC_OR_DEPARTMENT_OTHER): Payer: 59

## 2014-04-03 DIAGNOSIS — C163 Malignant neoplasm of pyloric antrum: Secondary | ICD-10-CM | POA: Diagnosis not present

## 2014-04-03 DIAGNOSIS — Z5111 Encounter for antineoplastic chemotherapy: Secondary | ICD-10-CM

## 2014-04-03 DIAGNOSIS — C169 Malignant neoplasm of stomach, unspecified: Secondary | ICD-10-CM

## 2014-04-03 LAB — CBC WITH DIFFERENTIAL/PLATELET
BASO%: 0.8 % (ref 0.0–2.0)
Basophils Absolute: 0 10*3/uL (ref 0.0–0.1)
EOS%: 1.1 % (ref 0.0–7.0)
Eosinophils Absolute: 0 10*3/uL (ref 0.0–0.5)
HCT: 31.4 % — ABNORMAL LOW (ref 34.8–46.6)
HGB: 10.6 g/dL — ABNORMAL LOW (ref 11.6–15.9)
LYMPH#: 1.5 10*3/uL (ref 0.9–3.3)
LYMPH%: 39.2 % (ref 14.0–49.7)
MCH: 30.1 pg (ref 25.1–34.0)
MCHC: 33.8 g/dL (ref 31.5–36.0)
MCV: 89.2 fL (ref 79.5–101.0)
MONO#: 0.7 10*3/uL (ref 0.1–0.9)
MONO%: 17.6 % — ABNORMAL HIGH (ref 0.0–14.0)
NEUT%: 41.3 % (ref 38.4–76.8)
NEUTROS ABS: 1.5 10*3/uL (ref 1.5–6.5)
NRBC: 0 % (ref 0–0)
PLATELETS: 247 10*3/uL (ref 145–400)
RBC: 3.52 10*6/uL — AB (ref 3.70–5.45)
RDW: 13.4 % (ref 11.2–14.5)
WBC: 3.7 10*3/uL — AB (ref 3.9–10.3)

## 2014-04-03 MED ORDER — SODIUM CHLORIDE 0.9 % IJ SOLN
10.0000 mL | INTRAMUSCULAR | Status: DC | PRN
  Administered 2014-04-03: 10 mL
  Filled 2014-04-03: qty 10

## 2014-04-03 MED ORDER — FLUOROURACIL CHEMO INJECTION 2.5 GM/50ML
300.0000 mg/m2 | Freq: Once | INTRAVENOUS | Status: AC
  Administered 2014-04-03: 550 mg via INTRAVENOUS
  Filled 2014-04-03: qty 11

## 2014-04-03 MED ORDER — SODIUM CHLORIDE 0.9 % IV SOLN
Freq: Once | INTRAVENOUS | Status: AC
  Administered 2014-04-03: 11:00:00 via INTRAVENOUS

## 2014-04-03 MED ORDER — LEUCOVORIN CALCIUM INJECTION 350 MG
300.0000 mg/m2 | Freq: Once | INTRAMUSCULAR | Status: AC
  Administered 2014-04-03: 538 mg via INTRAVENOUS
  Filled 2014-04-03: qty 26.9

## 2014-04-03 MED ORDER — HEPARIN SOD (PORK) LOCK FLUSH 100 UNIT/ML IV SOLN
500.0000 [IU] | Freq: Once | INTRAVENOUS | Status: AC | PRN
  Administered 2014-04-03: 500 [IU]
  Filled 2014-04-03: qty 5

## 2014-04-03 NOTE — Patient Instructions (Signed)
[facility] Discharge Instructions for Patients Receiving Chemotherapy  Today you received the following chemotherapy agents: Leucovorin, 5FU  To help prevent nausea and vomiting after your treatment, we encourage you to take your nausea medication as prescribed.    If you develop nausea and vomiting that is not controlled by your nausea medication, call the clinic.   BELOW ARE SYMPTOMS THAT SHOULD BE REPORTED IMMEDIATELY:  *FEVER GREATER THAN 100.5 F  *CHILLS WITH OR WITHOUT FEVER  NAUSEA AND VOMITING THAT IS NOT CONTROLLED WITH YOUR NAUSEA MEDICATION  *UNUSUAL SHORTNESS OF BREATH  *UNUSUAL BRUISING OR BLEEDING  TENDERNESS IN MOUTH AND THROAT WITH OR WITHOUT PRESENCE OF ULCERS  *URINARY PROBLEMS  *BOWEL PROBLEMS  UNUSUAL RASH Items with * indicate a potential emergency and should be followed up as soon as possible.  Feel free to call the clinic you have any questions or concerns. The clinic phone number is [phone].    

## 2014-04-10 ENCOUNTER — Ambulatory Visit (HOSPITAL_BASED_OUTPATIENT_CLINIC_OR_DEPARTMENT_OTHER): Payer: Medicaid Other | Admitting: Oncology

## 2014-04-10 ENCOUNTER — Other Ambulatory Visit: Payer: 59

## 2014-04-10 ENCOUNTER — Telehealth: Payer: Self-pay | Admitting: Oncology

## 2014-04-10 ENCOUNTER — Ambulatory Visit (HOSPITAL_BASED_OUTPATIENT_CLINIC_OR_DEPARTMENT_OTHER): Payer: 59

## 2014-04-10 VITALS — BP 122/48 | HR 83 | Temp 98.6°F | Resp 18 | Ht 66.0 in | Wt 161.0 lb

## 2014-04-10 DIAGNOSIS — K1230 Oral mucositis (ulcerative), unspecified: Secondary | ICD-10-CM | POA: Diagnosis not present

## 2014-04-10 DIAGNOSIS — Z5111 Encounter for antineoplastic chemotherapy: Secondary | ICD-10-CM | POA: Diagnosis not present

## 2014-04-10 DIAGNOSIS — L271 Localized skin eruption due to drugs and medicaments taken internally: Secondary | ICD-10-CM

## 2014-04-10 DIAGNOSIS — C169 Malignant neoplasm of stomach, unspecified: Secondary | ICD-10-CM

## 2014-04-10 DIAGNOSIS — C163 Malignant neoplasm of pyloric antrum: Secondary | ICD-10-CM

## 2014-04-10 DIAGNOSIS — G893 Neoplasm related pain (acute) (chronic): Secondary | ICD-10-CM

## 2014-04-10 LAB — CBC WITH DIFFERENTIAL/PLATELET
BASO%: 0.3 % (ref 0.0–2.0)
BASOS ABS: 0 10*3/uL (ref 0.0–0.1)
EOS ABS: 0 10*3/uL (ref 0.0–0.5)
EOS%: 0.6 % (ref 0.0–7.0)
HCT: 31.9 % — ABNORMAL LOW (ref 34.8–46.6)
HGB: 10.6 g/dL — ABNORMAL LOW (ref 11.6–15.9)
LYMPH%: 45.2 % (ref 14.0–49.7)
MCH: 29.7 pg (ref 25.1–34.0)
MCHC: 33.2 g/dL (ref 31.5–36.0)
MCV: 89.4 fL (ref 79.5–101.0)
MONO#: 0.7 10*3/uL (ref 0.1–0.9)
MONO%: 21.3 % — ABNORMAL HIGH (ref 0.0–14.0)
NEUT%: 32.6 % — AB (ref 38.4–76.8)
NEUTROS ABS: 1 10*3/uL — AB (ref 1.5–6.5)
PLATELETS: 189 10*3/uL (ref 145–400)
RBC: 3.57 10*6/uL — AB (ref 3.70–5.45)
RDW: 13.3 % (ref 11.2–14.5)
WBC: 3.1 10*3/uL — ABNORMAL LOW (ref 3.9–10.3)
lymph#: 1.4 10*3/uL (ref 0.9–3.3)

## 2014-04-10 MED ORDER — HEPARIN SOD (PORK) LOCK FLUSH 100 UNIT/ML IV SOLN
500.0000 [IU] | Freq: Once | INTRAVENOUS | Status: AC | PRN
  Administered 2014-04-10: 500 [IU]
  Filled 2014-04-10: qty 5

## 2014-04-10 MED ORDER — LEUCOVORIN CALCIUM INJECTION 350 MG
300.0000 mg/m2 | Freq: Once | INTRAVENOUS | Status: AC
  Administered 2014-04-10: 538 mg via INTRAVENOUS
  Filled 2014-04-10: qty 26.9

## 2014-04-10 MED ORDER — SODIUM CHLORIDE 0.9 % IV SOLN
Freq: Once | INTRAVENOUS | Status: AC
Start: 2014-04-10 — End: 2014-04-10
  Administered 2014-04-10: 10:00:00 via INTRAVENOUS

## 2014-04-10 MED ORDER — SODIUM CHLORIDE 0.9 % IJ SOLN
10.0000 mL | INTRAMUSCULAR | Status: DC | PRN
  Administered 2014-04-10: 10 mL
  Filled 2014-04-10: qty 10

## 2014-04-10 MED ORDER — FLUOROURACIL CHEMO INJECTION 2.5 GM/50ML
300.0000 mg/m2 | Freq: Once | INTRAVENOUS | Status: AC
  Administered 2014-04-10: 550 mg via INTRAVENOUS
  Filled 2014-04-10: qty 11

## 2014-04-10 NOTE — Telephone Encounter (Signed)
Gave avs & cal for Feb. °

## 2014-04-10 NOTE — Progress Notes (Signed)
[place] OFFICE PROGRESS NOTE   Diagnosis: Gastric cancer  INTERVAL HISTORY:   She began another cycle of 5-fluorouracil 03/18/2014. She reports nausea on the day of chemotherapy. No mouth sores or diarrhea. She has dryness of the hands and feet. She is working. Good appetite.  Objective:  Vital signs in last 24 hours:  Blood pressure 122/48, pulse 83, temperature 98.6 F (37 C), temperature source Oral, resp. rate 18, height 5\' 6"  (1.676 m), weight 161 lb (73.029 kg), SpO2 100 %.    HEENT: Hyperpigmentation of the mucosa, no thrush or ulcers Resp: Lungs clear bilaterally Cardio: Regular rate and rhythm GI: No hepatomegaly, nontender, no mass Vascular: No leg edema  Skin: Dryness with superficial flaking over the trunk, hyperpigmentation of the hands, callus formation over the soles   Portacath/PICC-without erythema  Lab Results:  Lab Results  Component Value Date   WBC 3.1* 04/10/2014   HGB 10.6* 04/10/2014   HCT 31.9* 04/10/2014   MCV 89.4 04/10/2014   PLT 189 04/10/2014   NEUTROABS 1.0* 04/10/2014    Medications: I have reviewed the patient's current medications.  Assessment/Plan: 1. Gastric cancer status post upper endoscopy 09/22/2013 with findings of a partially obstructing oozing cratered gastric ulcer in the gastric antrum.  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   CT scans chest/abdomen/pelvis 10/03/2013 showed a 7 x 5 mm groundglass opacity in the superior segment of the right lower lobe; a 4.5 mm nodular density in the right upper lobe; bilateral axillary adenopathy; low density area anteriorly in the left hepatic lobe most consistent with fatty infiltration; another hypodense area within the medial segment of the left hepatic lobe; severe gastric distention; mild bilateral inguinal adenopathy.   Subtotal gastrectomy and lymph node dissection with creation of a gastrojejunostomy 10/10/2013. No  evidence of distant metastatic disease noted at the time of surgery. No evidence of liver metastases. 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 Tumor involved the serosal surface. Resection margins were negative. 10 of 17 lymph nodes were positive for metastatic adenocarcinoma.   Cycle 1 weekly 5-FU/leucovorin 11/14/2013, dose reduced beginning with week 3 secondary to mucositis and hand/foot syndrome.   Cycle 2 weekly 5-FU/leucovorin beginning 12/19/2013.  Initiation of radiation/Xeloda 01/26/2014. She decided to discontinue further radiation/Xeloda after one fraction due to significant nausea/vomiting.  Cycle 3 weekly 5-FU/leucovorin beginning 02/13/2014  Cycle 4 weekly 5-FU/leucovorin beginning 03/18/2014 2. Gastric outlet obstruction secondary to #1. Resolved. 3. Nausea/vomiting secondary to #2. Resolved. 4. Abdominal pain secondary to #1 and #2. Improved. 5. History of Weight loss secondary to #1 and #2. 6. Microcytic anemia, likely iron deficiency. She received iron dextran 10/11/2013. Improved. 7. Lupus/Sjgren's. 8. Pneumonia during hospitalization July 2015.  9. Abdominal abscesses with body fluid culture 10/20/2013 showing moderate Candida tropicalis. She was discharged home on a 4 week course of fluconazole.  10. Mucositis and hand/foot syndrome following cycle 1-week #3 5-FU/leucovorin, the 5-FU and leucovorin were dose reduced. 11. 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 12. Status post LEEP 11/07/2013. Pathology shows moderate to severe dysplasia.   Disposition:  She appears stable. She will complete cycle 4 of adjuvant 5-FU/leucovorin today. She would like to delay the final planned cycle of chemotherapy until 04/28/2014. She has a mild degree of neutropenia today. This is likely secondary to chemotherapy and a benign normal variant. She knows to contact us for a fever. She  will return for a CBC in one week.  She  will be scheduled for an office visit on 05/19/2014.  Betsy Coder, MD  04/10/2014  9:37 AM

## 2014-04-10 NOTE — Progress Notes (Signed)
OK to treat with ANC of 1.0 VO given and read back from Dr. Benay Spice (phone).  Pt. Will not have neulasta this time, but will have her come in one week to check her counts.  Chemo alert card given

## 2014-04-10 NOTE — Patient Instructions (Signed)
[facility] Discharge Instructions for Patients Receiving Chemotherapy  Today you received the following chemotherapy agents: Leucovorin, 5FU  To help prevent nausea and vomiting after your treatment, we encourage you to take your nausea medication as prescribed.    If you develop nausea and vomiting that is not controlled by your nausea medication, call the clinic.   BELOW ARE SYMPTOMS THAT SHOULD BE REPORTED IMMEDIATELY:  *FEVER GREATER THAN 100.5 F  *CHILLS WITH OR WITHOUT FEVER  NAUSEA AND VOMITING THAT IS NOT CONTROLLED WITH YOUR NAUSEA MEDICATION  *UNUSUAL SHORTNESS OF BREATH  *UNUSUAL BRUISING OR BLEEDING  TENDERNESS IN MOUTH AND THROAT WITH OR WITHOUT PRESENCE OF ULCERS  *URINARY PROBLEMS  *BOWEL PROBLEMS  UNUSUAL RASH Items with * indicate a potential emergency and should be followed up as soon as possible.  Feel free to call the clinic you have any questions or concerns. The clinic phone number is [phone].    

## 2014-04-13 ENCOUNTER — Telehealth: Payer: Self-pay | Admitting: Oncology

## 2014-04-13 ENCOUNTER — Telehealth: Payer: Self-pay | Admitting: *Deleted

## 2014-04-13 NOTE — Telephone Encounter (Signed)
Per staff message and POF I have scheduled appts. Advised scheduler of appts and to move lab appts  JMW  

## 2014-04-13 NOTE — Telephone Encounter (Signed)
Confirm appt for 02/02 change.

## 2014-04-28 ENCOUNTER — Other Ambulatory Visit: Payer: Self-pay | Admitting: Oncology

## 2014-04-28 ENCOUNTER — Other Ambulatory Visit (HOSPITAL_BASED_OUTPATIENT_CLINIC_OR_DEPARTMENT_OTHER): Payer: Medicaid Other

## 2014-04-28 ENCOUNTER — Ambulatory Visit (HOSPITAL_BASED_OUTPATIENT_CLINIC_OR_DEPARTMENT_OTHER): Payer: Medicaid Other

## 2014-04-28 DIAGNOSIS — Z5111 Encounter for antineoplastic chemotherapy: Secondary | ICD-10-CM

## 2014-04-28 DIAGNOSIS — C169 Malignant neoplasm of stomach, unspecified: Secondary | ICD-10-CM

## 2014-04-28 LAB — CBC WITH DIFFERENTIAL/PLATELET
BASO%: 0.5 % (ref 0.0–2.0)
Basophils Absolute: 0 10*3/uL (ref 0.0–0.1)
EOS ABS: 0.1 10*3/uL (ref 0.0–0.5)
EOS%: 1.3 % (ref 0.0–7.0)
HEMATOCRIT: 31.9 % — AB (ref 34.8–46.6)
HGB: 10.1 g/dL — ABNORMAL LOW (ref 11.6–15.9)
LYMPH#: 1.5 10*3/uL (ref 0.9–3.3)
LYMPH%: 37 % (ref 14.0–49.7)
MCH: 28.3 pg (ref 25.1–34.0)
MCHC: 31.6 g/dL (ref 31.5–36.0)
MCV: 89.5 fL (ref 79.5–101.0)
MONO#: 0.8 10*3/uL (ref 0.1–0.9)
MONO%: 20.1 % — ABNORMAL HIGH (ref 0.0–14.0)
NEUT#: 1.6 10*3/uL (ref 1.5–6.5)
NEUT%: 41.1 % (ref 38.4–76.8)
Platelets: 251 10*3/uL (ref 145–400)
RBC: 3.57 10*6/uL — ABNORMAL LOW (ref 3.70–5.45)
RDW: 14.3 % (ref 11.2–14.5)
WBC: 3.9 10*3/uL (ref 3.9–10.3)

## 2014-04-28 LAB — COMPREHENSIVE METABOLIC PANEL (CC13)
ALK PHOS: 64 U/L (ref 40–150)
ALT: 26 U/L (ref 0–55)
ANION GAP: 8 meq/L (ref 3–11)
AST: 31 U/L (ref 5–34)
Albumin: 3.2 g/dL — ABNORMAL LOW (ref 3.5–5.0)
BUN: 6 mg/dL — AB (ref 7.0–26.0)
CALCIUM: 8.2 mg/dL — AB (ref 8.4–10.4)
CHLORIDE: 105 meq/L (ref 98–109)
CO2: 26 meq/L (ref 22–29)
CREATININE: 0.7 mg/dL (ref 0.6–1.1)
Glucose: 83 mg/dl (ref 70–140)
Potassium: 4.5 mEq/L (ref 3.5–5.1)
Sodium: 138 mEq/L (ref 136–145)
Total Bilirubin: 0.54 mg/dL (ref 0.20–1.20)
Total Protein: 8.1 g/dL (ref 6.4–8.3)

## 2014-04-28 MED ORDER — FLUOROURACIL CHEMO INJECTION 2.5 GM/50ML
300.0000 mg/m2 | Freq: Once | INTRAVENOUS | Status: AC
  Administered 2014-04-28: 550 mg via INTRAVENOUS
  Filled 2014-04-28: qty 11

## 2014-04-28 MED ORDER — LEUCOVORIN CALCIUM INJECTION 350 MG
300.0000 mg/m2 | Freq: Once | INTRAVENOUS | Status: AC
  Administered 2014-04-28: 538 mg via INTRAVENOUS
  Filled 2014-04-28: qty 26.9

## 2014-04-28 MED ORDER — HEPARIN SOD (PORK) LOCK FLUSH 100 UNIT/ML IV SOLN
500.0000 [IU] | Freq: Once | INTRAVENOUS | Status: AC | PRN
  Administered 2014-04-28: 500 [IU]
  Filled 2014-04-28: qty 5

## 2014-04-28 MED ORDER — SODIUM CHLORIDE 0.9 % IV SOLN
Freq: Once | INTRAVENOUS | Status: AC
  Administered 2014-04-28: 13:00:00 via INTRAVENOUS

## 2014-04-28 MED ORDER — SODIUM CHLORIDE 0.9 % IJ SOLN
10.0000 mL | INTRAMUSCULAR | Status: DC | PRN
  Administered 2014-04-28: 10 mL
  Filled 2014-04-28: qty 10

## 2014-04-28 NOTE — Patient Instructions (Signed)
[facility] Discharge Instructions for Patients Receiving Chemotherapy  Today you received the following chemotherapy agents: 5FU, Leucovorin   To help prevent nausea and vomiting after your treatment, we encourage you to take your nausea medication as prescribed.  If you develop nausea and vomiting that is not controlled by your nausea medication, call the clinic.   BELOW ARE SYMPTOMS THAT SHOULD BE REPORTED IMMEDIATELY:  *FEVER GREATER THAN 100.5 F  *CHILLS WITH OR WITHOUT FEVER  NAUSEA AND VOMITING THAT IS NOT CONTROLLED WITH YOUR NAUSEA MEDICATION  *UNUSUAL SHORTNESS OF BREATH  *UNUSUAL BRUISING OR BLEEDING  TENDERNESS IN MOUTH AND THROAT WITH OR WITHOUT PRESENCE OF ULCERS  *URINARY PROBLEMS  *BOWEL PROBLEMS  UNUSUAL RASH Items with * indicate a potential emergency and should be followed up as soon as possible.  Feel free to call the clinic you have any questions or concerns. The clinic phone number is [phone].

## 2014-05-05 ENCOUNTER — Other Ambulatory Visit (HOSPITAL_BASED_OUTPATIENT_CLINIC_OR_DEPARTMENT_OTHER): Payer: Medicaid Other

## 2014-05-05 ENCOUNTER — Ambulatory Visit (HOSPITAL_BASED_OUTPATIENT_CLINIC_OR_DEPARTMENT_OTHER): Payer: Medicaid Other

## 2014-05-05 DIAGNOSIS — Z5111 Encounter for antineoplastic chemotherapy: Secondary | ICD-10-CM | POA: Diagnosis not present

## 2014-05-05 DIAGNOSIS — C169 Malignant neoplasm of stomach, unspecified: Secondary | ICD-10-CM

## 2014-05-05 LAB — CBC WITH DIFFERENTIAL/PLATELET
BASO%: 0.2 % (ref 0.0–2.0)
BASOS ABS: 0 10*3/uL (ref 0.0–0.1)
EOS ABS: 0 10*3/uL (ref 0.0–0.5)
EOS%: 0.9 % (ref 0.0–7.0)
HEMATOCRIT: 28.4 % — AB (ref 34.8–46.6)
HGB: 10.1 g/dL — ABNORMAL LOW (ref 11.6–15.9)
LYMPH%: 37.7 % (ref 14.0–49.7)
MCH: 32.4 pg (ref 25.1–34.0)
MCHC: 35.6 g/dL (ref 31.5–36.0)
MCV: 91 fL (ref 79.5–101.0)
MONO#: 0.7 10*3/uL (ref 0.1–0.9)
MONO%: 16.9 % — AB (ref 0.0–14.0)
NEUT#: 1.9 10*3/uL (ref 1.5–6.5)
NEUT%: 44.3 % (ref 38.4–76.8)
PLATELETS: 239 10*3/uL (ref 145–400)
RBC: 3.12 10*6/uL — ABNORMAL LOW (ref 3.70–5.45)
RDW: 13.9 % (ref 11.2–14.5)
WBC: 4.3 10*3/uL (ref 3.9–10.3)
lymph#: 1.6 10*3/uL (ref 0.9–3.3)
nRBC: 0 % (ref 0–0)

## 2014-05-05 MED ORDER — SODIUM CHLORIDE 0.9 % IJ SOLN
10.0000 mL | INTRAMUSCULAR | Status: DC | PRN
  Administered 2014-05-05: 10 mL
  Filled 2014-05-05: qty 10

## 2014-05-05 MED ORDER — SODIUM CHLORIDE 0.9 % IV SOLN
Freq: Once | INTRAVENOUS | Status: AC
  Administered 2014-05-05: 11:00:00 via INTRAVENOUS

## 2014-05-05 MED ORDER — FLUOROURACIL CHEMO INJECTION 2.5 GM/50ML
300.0000 mg/m2 | Freq: Once | INTRAVENOUS | Status: AC
  Administered 2014-05-05: 550 mg via INTRAVENOUS
  Filled 2014-05-05: qty 11

## 2014-05-05 MED ORDER — LEUCOVORIN CALCIUM INJECTION 350 MG
300.0000 mg/m2 | Freq: Once | INTRAMUSCULAR | Status: AC
  Administered 2014-05-05: 538 mg via INTRAVENOUS
  Filled 2014-05-05: qty 26.9

## 2014-05-05 MED ORDER — HEPARIN SOD (PORK) LOCK FLUSH 100 UNIT/ML IV SOLN
500.0000 [IU] | Freq: Once | INTRAVENOUS | Status: AC | PRN
  Administered 2014-05-05: 500 [IU]
  Filled 2014-05-05: qty 5

## 2014-05-05 NOTE — Patient Instructions (Signed)
[facility] Discharge Instructions for Patients Receiving Chemotherapy  Today you received the following chemotherapy agents Leucovorin and 5FU.  To help prevent nausea and vomiting after your treatment, we encourage you to take your nausea medication.   If you develop nausea and vomiting that is not controlled by your nausea medication, call the clinic.   BELOW ARE SYMPTOMS THAT SHOULD BE REPORTED IMMEDIATELY:  *FEVER GREATER THAN 100.5 F  *CHILLS WITH OR WITHOUT FEVER  NAUSEA AND VOMITING THAT IS NOT CONTROLLED WITH YOUR NAUSEA MEDICATION  *UNUSUAL SHORTNESS OF BREATH  *UNUSUAL BRUISING OR BLEEDING  TENDERNESS IN MOUTH AND THROAT WITH OR WITHOUT PRESENCE OF ULCERS  *URINARY PROBLEMS  *BOWEL PROBLEMS  UNUSUAL RASH Items with * indicate a potential emergency and should be followed up as soon as possible.  Feel free to call the clinic you have any questions or concerns. The clinic phone number is [phone].    

## 2014-05-12 ENCOUNTER — Other Ambulatory Visit (HOSPITAL_BASED_OUTPATIENT_CLINIC_OR_DEPARTMENT_OTHER): Payer: Medicaid Other

## 2014-05-12 ENCOUNTER — Ambulatory Visit (HOSPITAL_BASED_OUTPATIENT_CLINIC_OR_DEPARTMENT_OTHER): Payer: Medicaid Other

## 2014-05-12 DIAGNOSIS — C163 Malignant neoplasm of pyloric antrum: Secondary | ICD-10-CM | POA: Diagnosis not present

## 2014-05-12 DIAGNOSIS — Z5111 Encounter for antineoplastic chemotherapy: Secondary | ICD-10-CM

## 2014-05-12 DIAGNOSIS — C169 Malignant neoplasm of stomach, unspecified: Secondary | ICD-10-CM

## 2014-05-12 LAB — CBC WITH DIFFERENTIAL/PLATELET
BASO%: 0.2 % (ref 0.0–2.0)
Basophils Absolute: 0 10*3/uL (ref 0.0–0.1)
EOS%: 1.5 % (ref 0.0–7.0)
Eosinophils Absolute: 0.1 10*3/uL (ref 0.0–0.5)
HCT: 30.1 % — ABNORMAL LOW (ref 34.8–46.6)
HEMOGLOBIN: 9.9 g/dL — AB (ref 11.6–15.9)
LYMPH#: 1.2 10*3/uL (ref 0.9–3.3)
LYMPH%: 28 % (ref 14.0–49.7)
MCH: 28.3 pg (ref 25.1–34.0)
MCHC: 32.9 g/dL (ref 31.5–36.0)
MCV: 86 fL (ref 79.5–101.0)
MONO#: 0.7 10*3/uL (ref 0.1–0.9)
MONO%: 16.8 % — ABNORMAL HIGH (ref 0.0–14.0)
NEUT%: 53.5 % (ref 38.4–76.8)
NEUTROS ABS: 2.2 10*3/uL (ref 1.5–6.5)
NRBC: 0 % (ref 0–0)
Platelets: 236 10*3/uL (ref 145–400)
RBC: 3.5 10*6/uL — AB (ref 3.70–5.45)
RDW: 13.9 % (ref 11.2–14.5)
WBC: 4.1 10*3/uL (ref 3.9–10.3)

## 2014-05-12 MED ORDER — FLUOROURACIL CHEMO INJECTION 2.5 GM/50ML
300.0000 mg/m2 | Freq: Once | INTRAVENOUS | Status: AC
  Administered 2014-05-12: 550 mg via INTRAVENOUS
  Filled 2014-05-12: qty 11

## 2014-05-12 MED ORDER — HEPARIN SOD (PORK) LOCK FLUSH 100 UNIT/ML IV SOLN
500.0000 [IU] | Freq: Once | INTRAVENOUS | Status: AC | PRN
  Administered 2014-05-12: 500 [IU]
  Filled 2014-05-12: qty 5

## 2014-05-12 MED ORDER — LEUCOVORIN CALCIUM INJECTION 350 MG
300.0000 mg/m2 | Freq: Once | INTRAVENOUS | Status: AC
  Administered 2014-05-12: 538 mg via INTRAVENOUS
  Filled 2014-05-12: qty 26.9

## 2014-05-12 MED ORDER — SODIUM CHLORIDE 0.9 % IJ SOLN
10.0000 mL | INTRAMUSCULAR | Status: DC | PRN
  Administered 2014-05-12: 10 mL
  Filled 2014-05-12: qty 10

## 2014-05-12 MED ORDER — SODIUM CHLORIDE 0.9 % IV SOLN
Freq: Once | INTRAVENOUS | Status: AC
  Administered 2014-05-12: 11:00:00 via INTRAVENOUS

## 2014-05-12 NOTE — Patient Instructions (Signed)
[place] Discharge Instructions for Patients Receiving Chemotherapy  Today you received the following chemotherapy agents 5 FU, Leucovorin To help prevent nausea and vomiting after your treatment, we encourage you to take your nausea medication as directed prescribed   If you develop nausea and vomiting that is not controlled by your nausea medication, call the clinic.   BELOW ARE SYMPTOMS THAT SHOULD BE REPORTED IMMEDIATELY:  *FEVER GREATER THAN 100.5 F  *CHILLS WITH OR WITHOUT FEVER  NAUSEA AND VOMITING THAT IS NOT CONTROLLED WITH YOUR NAUSEA MEDICATION  *UNUSUAL SHORTNESS OF BREATH  *UNUSUAL BRUISING OR BLEEDING  TENDERNESS IN MOUTH AND THROAT WITH OR WITHOUT PRESENCE OF ULCERS  *URINARY PROBLEMS  *BOWEL PROBLEMS  UNUSUAL RASH Items with * indicate a potential emergency and should be followed up as soon as possible.  Feel free to call the clinic you have any questions or concerns. The clinic phone number is [phone].

## 2014-05-19 ENCOUNTER — Other Ambulatory Visit (HOSPITAL_BASED_OUTPATIENT_CLINIC_OR_DEPARTMENT_OTHER): Payer: Medicaid Other

## 2014-05-19 ENCOUNTER — Ambulatory Visit (HOSPITAL_BASED_OUTPATIENT_CLINIC_OR_DEPARTMENT_OTHER): Payer: Medicaid Other | Admitting: Nurse Practitioner

## 2014-05-19 ENCOUNTER — Ambulatory Visit (HOSPITAL_BASED_OUTPATIENT_CLINIC_OR_DEPARTMENT_OTHER): Payer: Medicaid Other

## 2014-05-19 ENCOUNTER — Telehealth: Payer: Self-pay | Admitting: Oncology

## 2014-05-19 VITALS — BP 121/72 | HR 89 | Temp 98.6°F | Resp 18 | Ht 66.0 in | Wt 161.5 lb

## 2014-05-19 DIAGNOSIS — D709 Neutropenia, unspecified: Secondary | ICD-10-CM | POA: Diagnosis not present

## 2014-05-19 DIAGNOSIS — D72818 Other decreased white blood cell count: Secondary | ICD-10-CM | POA: Diagnosis not present

## 2014-05-19 DIAGNOSIS — C163 Malignant neoplasm of pyloric antrum: Secondary | ICD-10-CM | POA: Diagnosis present

## 2014-05-19 DIAGNOSIS — D509 Iron deficiency anemia, unspecified: Secondary | ICD-10-CM

## 2014-05-19 DIAGNOSIS — C169 Malignant neoplasm of stomach, unspecified: Secondary | ICD-10-CM

## 2014-05-19 DIAGNOSIS — Z5111 Encounter for antineoplastic chemotherapy: Secondary | ICD-10-CM | POA: Diagnosis present

## 2014-05-19 LAB — CBC WITH DIFFERENTIAL/PLATELET
BASO%: 0.5 % (ref 0.0–2.0)
BASOS ABS: 0 10*3/uL (ref 0.0–0.1)
EOS%: 1.2 % (ref 0.0–7.0)
Eosinophils Absolute: 0 10*3/uL (ref 0.0–0.5)
HCT: 30.8 % — ABNORMAL LOW (ref 34.8–46.6)
HEMOGLOBIN: 9.8 g/dL — AB (ref 11.6–15.9)
LYMPH%: 30.5 % (ref 14.0–49.7)
MCH: 27.7 pg (ref 25.1–34.0)
MCHC: 31.7 g/dL (ref 31.5–36.0)
MCV: 87.3 fL (ref 79.5–101.0)
MONO#: 0.5 10*3/uL (ref 0.1–0.9)
MONO%: 18.9 % — ABNORMAL HIGH (ref 0.0–14.0)
NEUT#: 1.4 10*3/uL — ABNORMAL LOW (ref 1.5–6.5)
NEUT%: 48.9 % (ref 38.4–76.8)
PLATELETS: 264 10*3/uL (ref 145–400)
RBC: 3.53 10*6/uL — ABNORMAL LOW (ref 3.70–5.45)
RDW: 15.1 % — ABNORMAL HIGH (ref 11.2–14.5)
WBC: 2.9 10*3/uL — AB (ref 3.9–10.3)
lymph#: 0.9 10*3/uL (ref 0.9–3.3)

## 2014-05-19 MED ORDER — HEPARIN SOD (PORK) LOCK FLUSH 100 UNIT/ML IV SOLN
500.0000 [IU] | Freq: Once | INTRAVENOUS | Status: AC | PRN
  Administered 2014-05-19: 500 [IU]
  Filled 2014-05-19: qty 5

## 2014-05-19 MED ORDER — FLUOROURACIL CHEMO INJECTION 2.5 GM/50ML
300.0000 mg/m2 | Freq: Once | INTRAVENOUS | Status: AC
  Administered 2014-05-19: 550 mg via INTRAVENOUS
  Filled 2014-05-19: qty 11

## 2014-05-19 MED ORDER — SODIUM CHLORIDE 0.9 % IJ SOLN
10.0000 mL | INTRAMUSCULAR | Status: DC | PRN
  Administered 2014-05-19: 10 mL
  Filled 2014-05-19: qty 10

## 2014-05-19 MED ORDER — DEXTROSE 5 % IV SOLN
300.0000 mg/m2 | Freq: Once | INTRAVENOUS | Status: AC
  Administered 2014-05-19: 538 mg via INTRAVENOUS
  Filled 2014-05-19: qty 26.9

## 2014-05-19 MED ORDER — SODIUM CHLORIDE 0.9 % IV SOLN
Freq: Once | INTRAVENOUS | Status: AC
  Administered 2014-05-19: 12:00:00 via INTRAVENOUS

## 2014-05-19 NOTE — Progress Notes (Signed)
[place] OFFICE PROGRESS NOTE   Diagnosis:  Gastric cancer  INTERVAL HISTORY:   She returns as scheduled. She began the final cycle of 5-fluorouracil/leucovorin beginning 04/28/2014. She denies nausea/vomiting. No mouth sores. No diarrhea. She notes darkening of the skin on her hands. She denies fever. She has an occasional cough. No shortness of breath.  Objective:  Vital signs in last 24 hours:  Blood pressure 121/72, pulse 89, temperature 98.6 F (37 C), temperature source Oral, resp. rate 18, height 5\' 6"  (1.676 m), weight 161 lb 8 oz (73.256 kg), SpO2 100 %.    HEENT: No thrush or ulcers. Resp: Lungs clear bilaterally. Cardio: Regular rate and rhythm. GI: Abdomen soft and nontender. No hepatomegaly. Vascular: No leg edema. Skin: Palms with mild hyperpigmentation, dryness. No skin breakdown.    Lab Results:  Lab Results  Component Value Date   WBC 2.9* 05/19/2014   HGB 9.8* 05/19/2014   HCT 30.8* 05/19/2014   MCV 87.3 05/19/2014   PLT 264 05/19/2014   NEUTROABS 1.4* 05/19/2014    Imaging:  No results found.  Medications: I have reviewed the patient's current medications.  Assessment/Plan: 1. Gastric cancer status post upper endoscopy 09/22/2013 with findings of a partially obstructing oozing cratered gastric ulcer in the gastric antrum.  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   CT scans chest/abdomen/pelvis 10/03/2013 showed a 7 x 5 mm groundglass opacity in the superior segment of the right lower lobe; a 4.5 mm nodular density in the right upper lobe; bilateral axillary adenopathy; low density area anteriorly in the left hepatic lobe most consistent with fatty infiltration; another hypodense area within the medial segment of the left hepatic lobe; severe gastric distention; mild bilateral inguinal adenopathy.   Subtotal gastrectomy and lymph node dissection with creation of a gastrojejunostomy  10/10/2013. No evidence of distant metastatic disease noted at the time of surgery. No evidence of liver metastases. 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 Tumor involved the serosal surface. Resection margins were negative. 10 of 17 lymph nodes were positive for metastatic adenocarcinoma.   Cycle 1 weekly 5-FU/leucovorin 11/14/2013, dose reduced beginning with week 3 secondary to mucositis and hand/foot syndrome.   Cycle 2 weekly 5-FU/leucovorin beginning 12/19/2013.  Initiation of radiation/Xeloda 01/26/2014. She decided to discontinue further radiation/Xeloda after one fraction due to significant nausea/vomiting.  Cycle 3 weekly 5-FU/leucovorin beginning 02/13/2014  Cycle 4 weekly 5-FU/leucovorin beginning 03/18/2014  Cycle 5 weekly 5-FU/leucovorin beginning 04/28/2014 2. Gastric outlet obstruction secondary to #1. Resolved. 3. Nausea/vomiting secondary to #2. Resolved. 4. Abdominal pain secondary to #1 and #2. Improved. 5. History of Weight loss secondary to #1 and #2. 6. Microcytic anemia, likely iron deficiency. She received iron dextran 10/11/2013. Improved. 7. Lupus/Sjgren's. 8. Pneumonia during hospitalization July 2015.  9. Abdominal abscesses with body fluid culture 10/20/2013 showing moderate Candida tropicalis. She was discharged home on a 4 week course of fluconazole.  10. Mucositis and hand/foot syndrome following cycle 1-week #3 5-FU/leucovorin, the 5-FU and leucovorin were dose reduced. 11. 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 12. Status post LEEP 11/07/2013. Pathology shows moderate to severe dysplasia.   Disposition: She appears stable. Plan to proceed with chemotherapy today as scheduled to complete the fifth and final cycle of weekly 5-FU/leucovorin.   The neutrophil count is mildly decreased. She understands to contact the office with any signs of infection.    We are referring her for CT scans in approximately one month  with a follow-up visit one week later to review the results. She will contact the office prior to her next visit with any problems.  Plan reviewed with Dr. Benay Spice.    Ned Card ANP/GNP-BC   05/19/2014  11:08 AM

## 2014-05-19 NOTE — Telephone Encounter (Signed)
gave pt avs and appts for march. [facility] will call with ct appt. no futher tx added after today. last date in care plan 2/23 and per pt today is the last tx. S/w chemo scheduler - no pump needed with this appt.

## 2014-05-19 NOTE — Patient Instructions (Signed)
[place] Discharge Instructions for Patients Receiving Chemotherapy  Today you received the following chemotherapy agents Leucovorin.  To help prevent nausea and vomiting after your treatment, we encourage you to take your nausea medication as directed.    If you develop nausea and vomiting that is not controlled by your nausea medication, call the clinic.   BELOW ARE SYMPTOMS THAT SHOULD BE REPORTED IMMEDIATELY:  *FEVER GREATER THAN 100.5 F  *CHILLS WITH OR WITHOUT FEVER  NAUSEA AND VOMITING THAT IS NOT CONTROLLED WITH YOUR NAUSEA MEDICATION  *UNUSUAL SHORTNESS OF BREATH  *UNUSUAL BRUISING OR BLEEDING  TENDERNESS IN MOUTH AND THROAT WITH OR WITHOUT PRESENCE OF ULCERS  *URINARY PROBLEMS  *BOWEL PROBLEMS  UNUSUAL RASH Items with * indicate a potential emergency and should be followed up as soon as possible.  Feel free to call the clinic you have any questions or concerns. The clinic phone number is [phone].

## 2014-05-25 ENCOUNTER — Other Ambulatory Visit: Payer: Self-pay | Admitting: Nurse Practitioner

## 2014-05-25 ENCOUNTER — Telehealth: Payer: Self-pay | Admitting: Oncology

## 2014-05-25 NOTE — Telephone Encounter (Signed)
Per 02/29 POF move GBS to [facility]ussing on 03/28 at 10:45, mailed sch to pt... KJ

## 2014-06-08 ENCOUNTER — Other Ambulatory Visit: Payer: Self-pay | Admitting: Obstetrics and Gynecology

## 2014-06-08 ENCOUNTER — Other Ambulatory Visit (HOSPITAL_COMMUNITY)
Admission: RE | Admit: 2014-06-08 | Discharge: 2014-06-08 | Disposition: A | Discharge status: Home / Self Care | Payer: 59 | Source: Ambulatory Visit | Attending: Obstetrics and Gynecology | Admitting: Obstetrics and Gynecology

## 2014-06-08 DIAGNOSIS — Z1151 Encounter for screening for human papillomavirus (HPV): Secondary | ICD-10-CM | POA: Insufficient documentation

## 2014-06-08 DIAGNOSIS — Z01419 Encounter for gynecological examination (general) (routine) without abnormal findings: Secondary | ICD-10-CM | POA: Insufficient documentation

## 2014-06-11 LAB — CYTOLOGY - PAP

## 2014-06-16 ENCOUNTER — Encounter (HOSPITAL_COMMUNITY): Payer: Self-pay

## 2014-06-16 ENCOUNTER — Ambulatory Visit (HOSPITAL_BASED_OUTPATIENT_CLINIC_OR_DEPARTMENT_OTHER): Payer: 59

## 2014-06-16 ENCOUNTER — Ambulatory Visit (HOSPITAL_COMMUNITY)
Admission: RE | Admit: 2014-06-16 | Discharge: 2014-06-16 | Disposition: A | Discharge status: Home / Self Care | Payer: 59 | Source: Ambulatory Visit | Attending: Nurse Practitioner | Admitting: Nurse Practitioner

## 2014-06-16 ENCOUNTER — Other Ambulatory Visit (HOSPITAL_BASED_OUTPATIENT_CLINIC_OR_DEPARTMENT_OTHER): Payer: Medicaid Other

## 2014-06-16 ENCOUNTER — Telehealth: Payer: Self-pay | Admitting: *Deleted

## 2014-06-16 DIAGNOSIS — Z9221 Personal history of antineoplastic chemotherapy: Secondary | ICD-10-CM | POA: Insufficient documentation

## 2014-06-16 DIAGNOSIS — C163 Malignant neoplasm of pyloric antrum: Secondary | ICD-10-CM | POA: Diagnosis present

## 2014-06-16 DIAGNOSIS — C169 Malignant neoplasm of stomach, unspecified: Secondary | ICD-10-CM | POA: Insufficient documentation

## 2014-06-16 DIAGNOSIS — Z9889 Other specified postprocedural states: Secondary | ICD-10-CM | POA: Insufficient documentation

## 2014-06-16 DIAGNOSIS — Z95828 Presence of other vascular implants and grafts: Secondary | ICD-10-CM

## 2014-06-16 LAB — CBC WITH DIFFERENTIAL/PLATELET
BASO%: 0.2 % (ref 0.0–2.0)
Basophils Absolute: 0 10*3/uL (ref 0.0–0.1)
EOS ABS: 0 10*3/uL (ref 0.0–0.5)
EOS%: 0.9 % (ref 0.0–7.0)
HEMATOCRIT: 29.1 % — AB (ref 34.8–46.6)
HEMOGLOBIN: 9.3 g/dL — AB (ref 11.6–15.9)
LYMPH#: 1.2 10*3/uL (ref 0.9–3.3)
LYMPH%: 28.5 % (ref 14.0–49.7)
MCH: 26.8 pg (ref 25.1–34.0)
MCHC: 32 g/dL (ref 31.5–36.0)
MCV: 83.9 fL (ref 79.5–101.0)
MONO#: 0.7 10*3/uL (ref 0.1–0.9)
MONO%: 16.7 % — ABNORMAL HIGH (ref 0.0–14.0)
NEUT#: 2.3 10*3/uL (ref 1.5–6.5)
NEUT%: 53.7 % (ref 38.4–76.8)
Platelets: 214 10*3/uL (ref 145–400)
RBC: 3.47 10*6/uL — ABNORMAL LOW (ref 3.70–5.45)
RDW: 14.4 % (ref 11.2–14.5)
WBC: 4.2 10*3/uL (ref 3.9–10.3)

## 2014-06-16 LAB — COMPREHENSIVE METABOLIC PANEL (CC13)
ALK PHOS: 62 U/L (ref 40–150)
ALT: 29 U/L (ref 0–55)
ANION GAP: 8 meq/L (ref 3–11)
AST: 47 U/L — ABNORMAL HIGH (ref 5–34)
Albumin: 3 g/dL — ABNORMAL LOW (ref 3.5–5.0)
BUN: 5.8 mg/dL — ABNORMAL LOW (ref 7.0–26.0)
CO2: 23 mEq/L (ref 22–29)
CREATININE: 0.7 mg/dL (ref 0.6–1.1)
Calcium: 8.4 mg/dL (ref 8.4–10.4)
Chloride: 108 mEq/L (ref 98–109)
EGFR: >90 mL/min/{1.73_m2} (ref >90)
Glucose: 94 mg/dl (ref 70–140)
Potassium: 4.1 mEq/L (ref 3.5–5.1)
SODIUM: 138 meq/L (ref 136–145)
Total Bilirubin: 0.56 mg/dL (ref 0.20–1.20)
Total Protein: 7.9 g/dL (ref 6.4–8.3)

## 2014-06-16 MED ORDER — IOHEXOL 300 MG/ML  SOLN
100.0000 mL | Freq: Once | INTRAMUSCULAR | Status: AC | PRN
  Administered 2014-06-16: 100 mL via INTRAVENOUS

## 2014-06-16 MED ORDER — SODIUM CHLORIDE 0.9 % IJ SOLN
10.0000 mL | INTRAMUSCULAR | Status: DC | PRN
  Administered 2014-06-16: 10 mL via INTRAVENOUS
  Filled 2014-06-16: qty 10

## 2014-06-16 NOTE — Telephone Encounter (Signed)
Received called report of CT scans:  1. New enhancing nodular lesions along the ventral peritoneal surface midline are highly concerning for peritoneal metastasis. The larger more inferior lesion (image 67, series 2) would likely be amenable to percutaneous biopsy. 2. Thickening along the surgical in wound itself represent granulation tissue but is indeterminate. 3. No evidence of local recurrence at antrectomy site. 4. No evidence of nodal metastasis.  Report printed from [facility], taken to Dr. Benay Spice.

## 2014-06-16 NOTE — Patient Instructions (Signed)

## 2014-06-22 ENCOUNTER — Encounter: Payer: Self-pay | Admitting: Oncology

## 2014-06-22 ENCOUNTER — Telehealth: Payer: Self-pay | Admitting: Oncology

## 2014-06-22 ENCOUNTER — Ambulatory Visit: Payer: Self-pay | Admitting: Oncology

## 2014-06-22 ENCOUNTER — Ambulatory Visit (HOSPITAL_BASED_OUTPATIENT_CLINIC_OR_DEPARTMENT_OTHER): Payer: Medicaid Other | Admitting: Oncology

## 2014-06-22 VITALS — BP 132/59 | HR 72 | Temp 98.3°F | Resp 18 | Ht 66.0 in | Wt 163.4 lb

## 2014-06-22 DIAGNOSIS — D509 Iron deficiency anemia, unspecified: Secondary | ICD-10-CM | POA: Diagnosis not present

## 2014-06-22 DIAGNOSIS — R935 Abnormal findings on diagnostic imaging of other abdominal regions, including retroperitoneum: Secondary | ICD-10-CM

## 2014-06-22 DIAGNOSIS — C169 Malignant neoplasm of stomach, unspecified: Secondary | ICD-10-CM | POA: Diagnosis not present

## 2014-06-22 NOTE — Telephone Encounter (Signed)
Pt confirmed labs/ov per 03/28 POF, gave pt AVS and calendar.... KJ  °

## 2014-06-22 NOTE — Progress Notes (Signed)
[facility] OFFICE PROGRESS NOTE   Diagnosis:  Gastric cancer  INTERVAL HISTORY:   She returns as scheduled. She is here to review the results of her recent CT scan. Feels better off of chemo. Energy level is improving. She denies abdominal pain. She denies nausea/vomiting. No mouth sores. No diarrhea. She notes darkening of the skin on her hands which has improved since stopping chemotherapy. She denies fever. She has an occasional cough. No shortness of breath. Notes pain with movement to her right lower back. Has been present for several weeks. Has not been taking any medication for this, but admits to exercising more and doing line dancing with twisting.  Objective:  Vital signs in last 24 hours:  Blood pressure 132/59, pulse 72, temperature 98.3 F (36.8 C), temperature source Oral, resp. rate 18, height 5\' 6"  (1.676 m), weight 163 lb 6.4 oz (74.118 kg), last menstrual period 06/16/2014, SpO2 100 %.    HEENT: No thrush or ulcers. Resp: Lungs clear bilaterally. Cardio: Regular rate and rhythm. GI: Abdomen soft and nontender. No hepatomegaly. Vascular: No leg edema. Skin: Palms with mild hyperpigmentation, dryness. No skin breakdown.    Lab Results:  Lab Results  Component Value Date   WBC 4.2 06/16/2014   HGB 9.3* 06/16/2014   HCT 29.1* 06/16/2014   MCV 83.9 06/16/2014   PLT 214 06/16/2014   NEUTROABS 2.3 06/16/2014    Imaging:  No results found.   EXAM: CT CHEST AND ABDOMEN WITHOUT CONTRAST  TECHNIQUE: Multidetector CT imaging of the chest and abdomen was performed following the standard protocol without intravenous contrast.  COMPARISON: PET-CT scan 11/11/2013  FINDINGS: CT CHEST FINDINGS  Mediastinum/Nodes: No axillary supraclavicular lymphadenopathy. Stable axillary lymph nodes. No mediastinal hilar lymphadenopathy. Small amount of tissue in the anterior mediastinum is felt to represent rebound thymic tissue (image 19, series 2).  No pericardial fluid. No central pulmonary embolism.  Lungs/Pleura: No pulmonary nodules. No bronchiectasis. No pleural thickening.  CT ABDOMEN FINDINGS  Hepatobiliary: No focal hepatic lesion. No biliary duct dilatation. Normal gallbladder.  Pancreas: Pancreas is normal. No ductal dilatation. No pancreatic inflammation. Surgical clips adjacent to pancreatic head.  Spleen: Normal spleen.  Adrenals/Urinary Tract: Adrenal glands and kidneys are normal. The proximal ureters are normal.  Stomach/Bowel: Postsurgical change consistent with gastric antrectomy. Gastroenterostomy without evidence of obstruction. No obstructing lesions present.  Vascular/Lymphatic: There is no periportal lymphadenopathy. No retroperitoneal lymphadenopathy. Abdominal aorta is normal caliber.  Other: There are two enhanced nodules along the ventral peritoneal surface which are new from comparison PET-CT scan of 11/11/2013. 9 x 8 mm lesion (image 62, series 2) and 10 x 8 mm lesion (image 67, series 2). These lesions abut the ventral peritoneal surface just left of midline. Within the ventral incision itself, there is a soft tissue nodule measuring 11 mm which is decreased in size from 18 mm on comparison PET-CT scan. Previous this lesion was fluid density now but is now solid.  Musculoskeletal: No aggressive osseous lesion.  IMPRESSION: Chest Impression:  1. No evidence of thoracic metastasis. 2. Rebound thymic hyperplasia.  Abdomen / Pelvis Impression:  1. New enhancing nodular lesions along the ventral peritoneal surface midline are highly concerning for peritoneal metastasis. The larger more inferior lesion (image 67, series 2) would likely be amenable to percutaneous biopsy. 2. Thickening along the surgical in wound itself represent granulation tissue but is indeterminate. 3. No evidence of local recurrence at antrectomy site. 4. No evidence of nodal metastasis.  These  results will be called to the ordering clinician or representative by the Radiologist Assistant, and communication documented in the PACS or zVision Dashboard.   Electronically Signed  By: Suzy Bouchard M.D.  On: 06/16/2014 10:56  Medications: I have reviewed the patient's current medications.  Assessment/Plan: 1. Gastric cancer status post upper endoscopy 09/22/2013 with findings of a partially obstructing oozing cratered gastric ulcer in the gastric antrum.  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   CT scans chest/abdomen/pelvis 10/03/2013 showed a 7 x 5 mm groundglass opacity in the superior segment of the right lower lobe; a 4.5 mm nodular density in the right upper lobe; bilateral axillary adenopathy; low density area anteriorly in the left hepatic lobe most consistent with fatty infiltration; another hypodense area within the medial segment of the left hepatic lobe; severe gastric distention; mild bilateral inguinal adenopathy.   Subtotal gastrectomy and lymph node dissection with creation of a gastrojejunostomy 10/10/2013. No evidence of distant metastatic disease noted at the time of surgery. No evidence of liver metastases. 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 Tumor involved the serosal surface. Resection margins were negative. 10 of 17 lymph nodes were positive for metastatic adenocarcinoma.   Cycle 1 weekly 5-FU/leucovorin 11/14/2013, dose reduced beginning with week 3 secondary to mucositis and hand/foot syndrome.   Cycle 2 weekly 5-FU/leucovorin beginning 12/19/2013.  Initiation of radiation/Xeloda 01/26/2014. She decided to discontinue further radiation/Xeloda after one fraction due to significant nausea/vomiting.  Cycle 3 weekly 5-FU/leucovorin beginning 02/13/2014  Cycle 4 weekly 5-FU/leucovorin beginning 03/18/2014  Cycle 5 weekly 5-FU/leucovorin beginning 04/28/2014 2. Gastric  outlet obstruction secondary to #1. Resolved. 3. Nausea/vomiting secondary to #2. Resolved. 4. Abdominal pain secondary to #1 and #2. Improved. 5. History of Weight loss secondary to #1 and #2. 6. Microcytic anemia, likely iron deficiency. She received iron dextran 10/11/2013. Improved. 7. Lupus/Sjgren's. 8. Pneumonia during hospitalization July 2015.  9. Abdominal abscesses with body fluid culture 10/20/2013 showing moderate Candida tropicalis. She was discharged home on a 4 week course of fluconazole.  10. Mucositis and hand/foot syndrome following cycle 1-week #3 5-FU/leucovorin, the 5-FU and leucovorin were dose reduced. 11. 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 12. Status post LEEP 11/07/2013. Pathology shows moderate to severe dysplasia.   Disposition:  The patient was seen and examined with Dr. Benay Spice. CT scan results were reviewed with the patient and her mother. Images were shown to the patient and her mother. Discussed that there are several small nodules noted in her abdomen. Discussed that these might be recurrence of the cancer versus inflammation. Options discussed including the watch and wait approach including a repeat CT scan in 3 months versus proceeding with a biopsy now in interventional radiology. The patient wishes to proceed with a biopsy at this time. Orders have been sent for CT-guided biopsy in interventional radiology this week. We will plan to call her with the results when they are available. Her case will also be reviewed in the multidisciplinary GI conference.  Her anemia tends to be a little worse today. She has stopped taking her multivitamin. She was advised to begin ferrous sulfate 325 mg twice a day and we will recheck her CBC at her next visit.   We will plan to bring her back for labs and a visit in about 6 weeks' time. However, if her biopsy results are positive for recurrent cancer we will  see her much sooner.  She will contact the office prior to  her next visit with any problems.    Mikey Bussing, DNP, AGPCNP-BC, AOCNP  06/22/2014  3:23 PM  This was a shared visit with Altamese Dilling. Ms. Zurn was interviewed and examined. The right lower back discomfort is likely benign musculoskeletal pain. She will contact us if this progresses.  We reviewed the CT images with her. Small nodule at the anterior peritoneum could represent postoperative changes versus carcinomatosis. We discussed observation with a repeat CT in 3 months versus proceeding with a diagnostic biopsy. She is more comfortable undergoing a biopsy now. We referred her to interventional radiology.  She will return for an office visit and Port-A-Cath flush in 6 weeks. We will see her sooner pending the biopsy finding.  Julieanne Manson, M.D.

## 2014-06-23 ENCOUNTER — Other Ambulatory Visit: Payer: Self-pay | Admitting: Radiology

## 2014-06-24 ENCOUNTER — Encounter (HOSPITAL_COMMUNITY): Payer: Self-pay

## 2014-06-24 ENCOUNTER — Ambulatory Visit (HOSPITAL_COMMUNITY)
Admission: RE | Admit: 2014-06-24 | Discharge: 2014-06-24 | Disposition: A | Discharge status: Home / Self Care | Payer: 59 | Source: Ambulatory Visit | Attending: Oncology | Admitting: Oncology

## 2014-06-24 ENCOUNTER — Other Ambulatory Visit: Payer: Self-pay | Admitting: Oncology

## 2014-06-24 DIAGNOSIS — R1909 Other intra-abdominal and pelvic swelling, mass and lump: Secondary | ICD-10-CM | POA: Insufficient documentation

## 2014-06-24 DIAGNOSIS — C169 Malignant neoplasm of stomach, unspecified: Secondary | ICD-10-CM

## 2014-06-24 DIAGNOSIS — C481 Malignant neoplasm of specified parts of peritoneum: Secondary | ICD-10-CM | POA: Diagnosis not present

## 2014-06-24 LAB — CBC
HCT: 29.3 % — ABNORMAL LOW (ref 36.0–46.0)
Hemoglobin: 9.3 g/dL — ABNORMAL LOW (ref 12.0–15.0)
MCH: 27.8 pg (ref 26.0–34.0)
MCHC: 31.7 g/dL (ref 30.0–36.0)
MCV: 87.7 fL (ref 78.0–100.0)
Platelets: 277 10*3/uL (ref 150–400)
RBC: 3.34 MIL/uL — AB (ref 3.87–5.11)
RDW: 14.3 % (ref 11.5–15.5)
WBC: 3.9 10*3/uL — ABNORMAL LOW (ref 4.0–10.5)

## 2014-06-24 LAB — PROTIME-INR
INR: 1.04 (ref 0.00–1.49)
PROTHROMBIN TIME: 13.8 s (ref 11.6–15.2)

## 2014-06-24 LAB — APTT: APTT: 30 s (ref 24–37)

## 2014-06-24 MED ORDER — FENTANYL CITRATE 0.05 MG/ML IJ SOLN
INTRAMUSCULAR | Status: AC
  Filled 2014-06-24: qty 2

## 2014-06-24 MED ORDER — FENTANYL CITRATE 0.05 MG/ML IJ SOLN
INTRAMUSCULAR | Status: AC | PRN
  Administered 2014-06-24 (×2): 50 ug via INTRAVENOUS

## 2014-06-24 MED ORDER — MIDAZOLAM HCL 2 MG/2ML IJ SOLN
INTRAMUSCULAR | Status: AC | PRN
  Administered 2014-06-24: 0.5 mg via INTRAVENOUS
  Administered 2014-06-24 (×2): 1 mg via INTRAVENOUS
  Administered 2014-06-24: 0.5 mg via INTRAVENOUS

## 2014-06-24 MED ORDER — SODIUM CHLORIDE 0.9 % IV SOLN
Freq: Once | INTRAVENOUS | Status: AC
  Administered 2014-06-24: 12:00:00 via INTRAVENOUS

## 2014-06-24 MED ORDER — MIDAZOLAM HCL 2 MG/2ML IJ SOLN
INTRAMUSCULAR | Status: AC
  Filled 2014-06-24: qty 6

## 2014-06-24 NOTE — Progress Notes (Signed)
Chief Complaint: "I'm here for a biopsy" Referring Physician:Curcio HPI: Andrea Best is an 44 y.o. female with hx of gastric cancer and is now found to have some peritoneal nodules. She is referred for biopsy. She's been NPO all day.  Past Medical History:  Past Medical History  Diagnosis Date  . Eczema   . Lupus     joint pain and extreme fatique - improved after plaquenil and prednisone therapy  . Sjogren's disease     caused pt to loose her teeth due to dry mouth;  prone to eye irritation and infection due to dry eye; affects immune system - PRONE TO INFECTIONS   . Allergy   . Tachycardia   . Protein-calorie malnutrition, severe   . Abnormal findings on esophagogastroduodenoscopy (EGD) 09/22/13    esophagitis  . Asthma     no recent flare ups - no inhalers - as of 11/19/13  . GERD (gastroesophageal reflux disease)   . Anemia     iron deficiency   . Pneumonia     health care -associated July 2015 post op gastrectomy surgery  . 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    Past Surgical History:  Past Surgical History  Procedure Laterality Date  . Cesarean section      1997  . Laparotomy N/A 10/10/2013    Procedure: SUBTOTAL GASTRECTOMY WITH GASTRIC JEJUNOSOTMY;  Surgeon: Edward Jolly, MD;  Location: [facility];  Service: General;  Laterality: N/A;  . Portacath placement Right 11/20/2013    Procedure: INSERTION PORT-A-CATH with ULTRA SOUND;  Surgeon: Adin Hector, MD;  Location: [facility];  Service: General;  Laterality: Right;    Family History:  Family History  Problem Relation Age of Onset  . Hypertension Mother   . Diabetes Mother   . Diabetes Maternal Grandmother   . Diabetes Maternal Grandfather     Social History:  reports that she has never smoked. She has never used smokeless tobacco. She reports that she drinks alcohol. She reports that she does not use illicit  drugs.  Allergies:  Allergies  Allergen Reactions  . Compazine [Prochlorperazine Maleate] Other (See Comments)    Dystonia  . Azithromycin Rash  . Sulfa Antibiotics Rash  . Hydrocodone-Acetaminophen Nausea And Vomiting    Hallucinating   . 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 SHE DOES NOT WANT ANY BLOOD PRODUCTS OR FRACTIONS    Medications:   Medication List    ASK your doctor about these medications        hydroxychloroquine 200 MG tablet  Commonly known as:  PLAQUENIL  Take 200 mg by mouth 2 (two) times daily.     ibuprofen 200 MG tablet  Commonly known as:  ADVIL,MOTRIN  Take 200-600 mg by mouth every 6 (six) hours as needed for headache or moderate pain.     lidocaine-prilocaine cream  Commonly known as:  EMLA  Appy small amount of cream over port site 1-2 hours prior to treatment and cover with plastic wrap.  DO NOT RUB IN     ondansetron 8 MG disintegrating tablet  Commonly known as:  ZOFRAN-ODT  Take 1 tablet (8 mg total) by mouth every 8 (eight) hours as needed for nausea or vomiting.     predniSONE 5 MG tablet  Commonly known as:  DELTASONE  Take 5 mg by mouth daily with breakfast.  Please HPI for pertinent positives, otherwise complete 10 system ROS negative.  Physical Exam: BP 98/83 mmHg  Pulse 80  Temp(Src) 97.7 F (36.5 C) (Oral)  Resp 16  Ht 5\' 6"  (1.676 m)  Wt 163 lb (73.936 kg)  BMI 26.32 kg/m2  SpO2 100%  LMP 06/16/2014 Body mass index is 26.32 kg/(m^2).   General Appearance:  Alert, cooperative, no distress, appears stated age  Head:  Normocephalic, without obvious abnormality, atraumatic  ENT: Unremarkable  Neck: Supple, symmetrical, trachea midline  Lungs:   Clear to auscultation bilaterally, no w/r/r, respirations unlabored without use of accessory muscles.  Chest Wall:  No tenderness or deformity  Heart:  Regular rate and rhythm, S1, S2 normal, no murmur, rub or gallop.  Abdomen:   Soft, non-tender, non distended.   Neurologic: Normal affect, no gross deficits.  Labs: Results for orders placed or performed during the hospital encounter of 06/24/14 (from the past 48 hour(s))  CBC upon arrival     Status: Abnormal   Collection Time: 06/24/14 11:42 AM  Result Value Ref Range   WBC 3.9 (L) 4.0 - 10.5 K/uL   RBC 3.34 (L) 3.87 - 5.11 MIL/uL   Hemoglobin 9.3 (L) 12.0 - 15.0 g/dL   HCT 29.3 (L) 36.0 - 46.0 %   MCV 87.7 78.0 - 100.0 fL   MCH 27.8 26.0 - 34.0 pg   MCHC 31.7 30.0 - 36.0 g/dL   RDW 14.3 11.5 - 15.5 %   Platelets 277 150 - 400 K/uL    Imaging: No results found.  Assessment/Plan Hx gastric cancer Peritoneal nodules on CT For biopsy. Labs ok Risks and Benefits discussed with the patient including, but not limited to bleeding, infection, damage to adjacent structures or low yield requiring additional tests. All of the patient's questions were answered, patient is agreeable to proceed. Consent signed and in chart.   Ascencion Dike PA-C 06/24/2014, 12:26 PM

## 2014-06-24 NOTE — Discharge Instructions (Signed)
Bone Marrow Aspiration, Bone Marrow Biopsy °Care After °Read the instructions outlined below and refer to this sheet in the next few weeks. These discharge instructions provide you with general information on caring for yourself after you leave the hospital. Your caregiver may also give you specific instructions. While your treatment has been planned according to the most current medical practices available, unavoidable complications occasionally occur. If you have any problems or questions after discharge, call your caregiver. °FINDING OUT THE RESULTS OF YOUR TEST °Not all test results are available during your visit. If your test results are not back during the visit, make an appointment with your caregiver to find out the results. Do not assume everything is normal if you have not heard from your caregiver or the medical facility. It is important for you to follow up on all of your test results.  °HOME CARE INSTRUCTIONS  °You have had sedation and may be sleepy or dizzy. Your thinking may not be as clear as usual. For the next 24 hours: °· Only take over-the-counter or prescription medicines for pain, discomfort, and or fever as directed by your caregiver. °· Do not drink alcohol. °· Do not smoke. °· Do not drive. °· Do not make important legal decisions. °· Do not operate heavy machinery. °· Do not care for small children by yourself. °· Keep your dressing clean and dry. You may replace dressing with a bandage after 24 hours. °· You may take a bath or shower after 24 hours. °· Use an ice pack for 20 minutes every 2 hours while awake for pain as needed. °SEEK MEDICAL CARE IF:  °· There is redness, swelling, or increasing pain at the biopsy site. °· There is pus coming from the biopsy site. °· There is drainage from a biopsy site lasting longer than one day. °· An unexplained oral temperature above 102° F (38.9° C) develops. °SEEK IMMEDIATE MEDICAL CARE IF:  °· You develop a rash. °· You have difficulty  breathing. °· You develop any reaction or side effects to medications given. °Document Released: 09/30/2004 Document Revised: 06/05/2011 Document Reviewed: 03/10/2008 °ExitCare® Patient Information ©2015 [employer]. This information is not intended to replace advice given to you by your health care provider. Make sure you discuss any questions you have with your health care provider. °Conscious Sedation, Adult, Care After °Refer to this sheet in the next few weeks. These instructions provide you with information on caring for yourself after your procedure. Your health care provider may also give you more specific instructions. Your treatment has been planned according to current medical practices, but problems sometimes occur. Call your health care provider if you have any problems or questions after your procedure. °WHAT TO EXPECT AFTER THE PROCEDURE  °After your procedure: °· You may feel sleepy, clumsy, and have poor balance for several hours. °· Vomiting may occur if you eat too soon after the procedure. °HOME CARE INSTRUCTIONS °· Do not participate in any activities where you could become injured for at least 24 hours. Do not: °¨ Drive. °¨ Swim. °¨ Ride a bicycle. °¨ Operate heavy machinery. °¨ Cook. °¨ Use power tools. °¨ Climb ladders. °¨ Work from a high place. °· Do not make important decisions or sign legal documents until you are improved. °· If you vomit, drink water, juice, or soup when you can drink without vomiting. Make sure you have little or no nausea before eating solid foods. °· Only take over-the-counter or prescription medicines for pain, discomfort, or fever   as directed by your health care provider. °· Make sure you and your family fully understand everything about the medicines given to you, including what side effects may occur. °· You should not drink alcohol, take sleeping pills, or take medicines that cause drowsiness for at least 24 hours. °· If you smoke, do not smoke without  supervision. °· If you are feeling better, you may resume normal activities 24 hours after you were sedated. °· Keep all appointments with your health care provider. °SEEK MEDICAL CARE IF: °· Your skin is pale or bluish in color. °· You continue to feel nauseous or vomit. °· Your pain is getting worse and is not helped by medicine. °· You have bleeding or swelling. °· You are still sleepy or feeling clumsy after 24 hours. °SEEK IMMEDIATE MEDICAL CARE IF: °· You develop a rash. °· You have difficulty breathing. °· You develop any type of allergic problem. °· You have a fever. °MAKE SURE YOU: °· Understand these instructions. °· Will watch your condition. °· Will get help right away if you are not doing well or get worse. °Document Released: 01/01/2013 Document Reviewed: 01/01/2013 °ExitCare® Patient Information ©2015 [employer]. This information is not intended to replace advice given to you by your health care provider. Make sure you discuss any questions you have with your health care provider. ° °

## 2014-06-24 NOTE — Procedures (Signed)
Successful anterior abdominal peritoneal nodule core bx No comp Stable Path pending Full report in PACS

## 2014-06-30 ENCOUNTER — Other Ambulatory Visit: Payer: Self-pay | Admitting: *Deleted

## 2014-07-03 ENCOUNTER — Other Ambulatory Visit: Payer: Self-pay | Admitting: *Deleted

## 2014-07-08 ENCOUNTER — Telehealth: Payer: Self-pay | Admitting: Oncology

## 2014-07-08 NOTE — Telephone Encounter (Signed)
s.w. pt and sched appt....pt ok adn aware

## 2014-07-09 ENCOUNTER — Ambulatory Visit (HOSPITAL_BASED_OUTPATIENT_CLINIC_OR_DEPARTMENT_OTHER): Payer: Medicaid Other | Admitting: Oncology

## 2014-07-09 ENCOUNTER — Ambulatory Visit: Payer: 59 | Admitting: Physician Assistant

## 2014-07-09 VITALS — BP 107/61 | HR 91 | Temp 97.8°F | Resp 18 | Ht 66.0 in | Wt 161.8 lb

## 2014-07-09 DIAGNOSIS — C163 Malignant neoplasm of pyloric antrum: Secondary | ICD-10-CM

## 2014-07-09 DIAGNOSIS — R109 Unspecified abdominal pain: Secondary | ICD-10-CM

## 2014-07-09 DIAGNOSIS — D649 Anemia, unspecified: Secondary | ICD-10-CM | POA: Diagnosis not present

## 2014-07-09 DIAGNOSIS — C169 Malignant neoplasm of stomach, unspecified: Secondary | ICD-10-CM

## 2014-07-09 NOTE — [facility] Oncology Navigator Note (Signed)
Met with patient and her mother during office visit today. Denies any needs or barriers to care at this time. Daughter, Lonn Georgia was tearful during encounter in regards to recurrence of her cancer. Patient feels she would benefit from talking with social worker or chaplain and will make her aware that she will be contacted. Will expedite referral to Dr. Karmen Stabs at [facility] in [place].

## 2014-07-09 NOTE — Progress Notes (Signed)
[facility] OFFICE PROGRESS NOTE   Diagnosis: Gastric cancer  INTERVAL HISTORY:   Ms. Poulsen returns as scheduled. She underwent a CT-guided biopsy of an anterior abdominal nodule on 06/24/2014. The pathology revealed poorly differentiated adenocarcinoma. She feels well. She continues to have intermittent discomfort in the right iliac region. She relates this to any her back with exercise. No other complaint.  Objective:  Vital signs in last 24 hours:  Blood pressure 107/61, pulse 91, temperature 97.8 F (36.6 C), temperature source Oral, resp. rate 18, height _0  (1.676 m), weight 161 lb 12.8 oz (73.392 kg), last menstrual period 06/16/2014, SpO2 100 %.   Resp: Lungs clear bilaterally Cardio: Regular rate and rhythm GI: No hepatomegaly, no mass, no apparent ascites Vascular: No leg edema  Musculoskeletal: No tenderness at the right iliac area   Portacath/PICC-without erythema  Lab Results:  Lab Results  Component Value Date   WBC 3.9* 06/24/2014   HGB 9.3* 06/24/2014   HCT 29.3* 06/24/2014   MCV 87.7 06/24/2014   PLT 277 06/24/2014   NEUTROABS 2.3 06/16/2014     Medications: I have reviewed the patient's current medications.  Assessment/Plan: 1. Gastric cancer status post upper endoscopy 09/22/2013 with findings of a partially obstructing oozing cratered gastric ulcer in the gastric antrum.  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   CT scans chest/abdomen/pelvis 10/03/2013 showed a 7 x 5 mm groundglass opacity in the superior segment of the right lower lobe; a 4.5 mm nodular density in the right upper lobe; bilateral axillary adenopathy; low density area anteriorly in the left hepatic lobe most consistent with fatty infiltration; another hypodense area within the medial segment of the left hepatic lobe; severe gastric distention; mild bilateral inguinal adenopathy.   Subtotal gastrectomy and lymph node  dissection with creation of a gastrojejunostomy 10/10/2013. No evidence of distant metastatic disease noted at the time of surgery. No evidence of liver metastases. 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 Tumor involved the serosal surface. Resection margins were negative. 10 of 17 lymph nodes were positive for metastatic adenocarcinoma. Her-2 not amplified   Cycle 1 weekly 5-FU/leucovorin 11/14/2013, dose reduced beginning with week 3 secondary to mucositis and hand/foot syndrome.   Cycle 2 weekly 5-FU/leucovorin beginning 12/19/2013.  Initiation of radiation/Xeloda 01/26/2014. She decided to discontinue further radiation/Xeloda after one fraction due to significant nausea/vomiting.  Cycle 3 weekly 5-FU/leucovorin beginning 02/13/2014  Cycle 4 weekly 5-FU/leucovorin beginning 03/18/2014  Cycle 5 weekly 5-FU/leucovorin beginning 04/28/2014  CTs 06/16/2014 with new enhancing nodular lesions along the ventral peritoneal surface  CT-guided biopsy of an anterior peritoneal lesion on 06/24/2014 confirmed poorly differentiated adenocarcinoma 2. Gastric outlet obstruction secondary to #1. Resolved. 3. Nausea/vomiting secondary to #2. Resolved. 4. Abdominal pain secondary to #1 and #2. Improved. 5. History of Weight loss secondary to #1 and #2. 6. Microcytic anemia, likely iron deficiency. She received iron dextran 10/11/2013. Improved. 7. Lupus/Sjgren's. 8. Pneumonia during hospitalization July 2015.  9. Abdominal abscesses with body fluid culture 10/20/2013 showing moderate Candida tropicalis. She was discharged home on a 4 week course of fluconazole.  10. Mucositis and hand/foot syndrome following cycle 1-week #3 5-FU/leucovorin, the 5-FU and leucovorin were dose reduced. 11. 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 12. Status post LEEP 11/07/2013. Pathology shows moderate to severe dysplasia.  Pap smear 06/08/2014-negative for intraepithelial lesions or malignancy   Disposition:  Ms. Degeorge has been diagnosed with metastatic gastric cancer after biopsy of  an anterior peritoneal nodule. I discussed the pathology result and treatment options with her. We discussed observation versus salvage systemic therapy. I will present her case at the GI tumor conference on 07/15/2014.  She appears to have limited metastatic disease. We discussed the possibility of surgery. There is generally no indication for surgery for treatment of metastatic gastric cancer and I suspect the CT findings do not represent the true extent of the carcinomatosis.  We referred her to Dr. Fanny Skates for a second opinion.  We will consider standard salvage treatment options when she returns in approximately 3 weeks. We will also plan for Foundation 1 testing in the future to look for potential targeted therapies.  I doubt the right iliac discomfort is related to the gastric cancer. We will image this area if the pain becomes more consistent or intense.  Betsy Coder, MD  07/09/2014  9:54 AM

## 2014-07-13 ENCOUNTER — Telehealth: Payer: Self-pay | Admitting: Oncology

## 2014-07-13 NOTE — Telephone Encounter (Signed)
Pt appt. With Dr. Fanny Skates is 07/23/14@11 :00. Medical records faxed.  Slides and scans will be fedex'ed. Pt is aware.

## 2014-08-03 ENCOUNTER — Telehealth: Payer: Self-pay | Admitting: Oncology

## 2014-08-03 ENCOUNTER — Ambulatory Visit (HOSPITAL_BASED_OUTPATIENT_CLINIC_OR_DEPARTMENT_OTHER): Payer: Medicaid Other | Admitting: Oncology

## 2014-08-03 ENCOUNTER — Ambulatory Visit (HOSPITAL_BASED_OUTPATIENT_CLINIC_OR_DEPARTMENT_OTHER): Payer: 59

## 2014-08-03 ENCOUNTER — Other Ambulatory Visit (HOSPITAL_BASED_OUTPATIENT_CLINIC_OR_DEPARTMENT_OTHER): Payer: Medicaid Other

## 2014-08-03 VITALS — BP 104/46 | HR 78 | Temp 98.2°F | Resp 20 | Ht 66.0 in | Wt 161.0 lb

## 2014-08-03 DIAGNOSIS — D509 Iron deficiency anemia, unspecified: Secondary | ICD-10-CM

## 2014-08-03 DIAGNOSIS — C163 Malignant neoplasm of pyloric antrum: Secondary | ICD-10-CM

## 2014-08-03 DIAGNOSIS — C169 Malignant neoplasm of stomach, unspecified: Secondary | ICD-10-CM

## 2014-08-03 DIAGNOSIS — Z95828 Presence of other vascular implants and grafts: Secondary | ICD-10-CM

## 2014-08-03 DIAGNOSIS — R109 Unspecified abdominal pain: Secondary | ICD-10-CM | POA: Diagnosis not present

## 2014-08-03 LAB — CBC WITH DIFFERENTIAL/PLATELET
BASO%: 0.3 % (ref 0.0–2.0)
Basophils Absolute: 0 10*3/uL (ref 0.0–0.1)
EOS%: 1 % (ref 0.0–7.0)
Eosinophils Absolute: 0 10*3/uL (ref 0.0–0.5)
HEMATOCRIT: 30.4 % — AB (ref 34.8–46.6)
HEMOGLOBIN: 9.6 g/dL — AB (ref 11.6–15.9)
LYMPH#: 1.5 10*3/uL (ref 0.9–3.3)
LYMPH%: 38.6 % (ref 14.0–49.7)
MCH: 24.9 pg — AB (ref 25.1–34.0)
MCHC: 31.6 g/dL (ref 31.5–36.0)
MCV: 78.8 fL — ABNORMAL LOW (ref 79.5–101.0)
MONO#: 0.6 10*3/uL (ref 0.1–0.9)
MONO%: 15.2 % — AB (ref 0.0–14.0)
NEUT#: 1.8 10*3/uL (ref 1.5–6.5)
NEUT%: 44.9 % (ref 38.4–76.8)
Platelets: 244 10*3/uL (ref 145–400)
RBC: 3.86 10*6/uL (ref 3.70–5.45)
RDW: 14 % (ref 11.2–14.5)
WBC: 4 10*3/uL (ref 3.9–10.3)

## 2014-08-03 MED ORDER — HEPARIN SOD (PORK) LOCK FLUSH 100 UNIT/ML IV SOLN
500.0000 [IU] | Freq: Once | INTRAVENOUS | Status: AC
  Administered 2014-08-03: 500 [IU] via INTRAVENOUS
  Filled 2014-08-03: qty 5

## 2014-08-03 MED ORDER — SODIUM CHLORIDE 0.9 % IJ SOLN
10.0000 mL | INTRAMUSCULAR | Status: DC | PRN
  Administered 2014-08-03: 10 mL via INTRAVENOUS
  Filled 2014-08-03: qty 10

## 2014-08-03 NOTE — Telephone Encounter (Signed)
per pof to sch pt appt-gave pt copy of sch °

## 2014-08-03 NOTE — Patient Instructions (Signed)

## 2014-08-03 NOTE — Progress Notes (Signed)
[place] OFFICE PROGRESS NOTE   Diagnosis:  Gastric cancer  INTERVAL HISTORY:   She returns as scheduled. She feels well. Good appetite and energy level. No consistent pain. The right lower back pain has resolved.  She saw Dr. Fanny Skates for a second opinion. Observation was recommended. She is scheduled for a restaging CT at [facility] and follow-up visit on 08/20/2014. Objective:  Vital signs in last 24 hours:  Blood pressure 104/46, pulse 78, temperature 98.2 F (36.8 C), temperature source Oral, resp. rate 20, height 5' 6"  (1.676 m), weight 161 lb (73.029 kg), SpO2 100 %.    HEENT: Neck without mass Lymphatics: No cervical or supraclavicular nodes Resp: Lungs clear bilaterally Cardio: Regular rate and rhythm GI: No hepatomegaly, nontender, no mass Vascular: No leg edema  Portacath/PICC-without erythema  Lab Results:  Lab Results  Component Value Date   WBC 4.0 08/03/2014   HGB 9.6* 08/03/2014   HCT 30.4* 08/03/2014   MCV 78.8* 08/03/2014   PLT 244 08/03/2014   NEUTROABS 1.8 08/03/2014     Medications: I have reviewed the patient's current medications.  Assessment/Plan: 1. Gastric cancer status post upper endoscopy 09/22/2013 with findings of a partially obstructing oozing cratered gastric ulcer in the gastric antrum.  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   CT scans chest/abdomen/pelvis 10/03/2013 showed a 7 x 5 mm groundglass opacity in the superior segment of the right lower lobe; a 4.5 mm nodular density in the right upper lobe; bilateral axillary adenopathy; low density area anteriorly in the left hepatic lobe most consistent with fatty infiltration; another hypodense area within the medial segment of the left hepatic lobe; severe gastric distention; mild bilateral inguinal adenopathy.   Subtotal gastrectomy and lymph node dissection with creation of a gastrojejunostomy 10/10/2013. No evidence of distant  metastatic disease noted at the time of surgery. No evidence of liver metastases. 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 Tumor involved the serosal surface. Resection margins were negative. 10 of 17 lymph nodes were positive for metastatic adenocarcinoma. Her-2 not amplified   Cycle 1 weekly 5-FU/leucovorin 11/14/2013, dose reduced beginning with week 3 secondary to mucositis and hand/foot syndrome.   Cycle 2 weekly 5-FU/leucovorin beginning 12/19/2013.  Initiation of radiation/Xeloda 01/26/2014. She decided to discontinue further radiation/Xeloda after one fraction due to significant nausea/vomiting.  Cycle 3 weekly 5-FU/leucovorin beginning 02/13/2014  Cycle 4 weekly 5-FU/leucovorin beginning 03/18/2014  Cycle 5 weekly 5-FU/leucovorin beginning 04/28/2014  CTs 06/16/2014 with new enhancing nodular lesions along the ventral peritoneal surface  CT-guided biopsy of an anterior peritoneal lesion on 06/24/2014 confirmed poorly differentiated adenocarcinoma 2. Gastric outlet obstruction secondary to #1. Resolved. 3. Nausea/vomiting secondary to #2. Resolved. 4. Abdominal pain secondary to #1 and #2. Improved. 5. History of Weight loss secondary to #1 and #2. 6. Microcytic anemia, likely iron deficiency. She received iron dextran 10/11/2013. 7. Lupus/Sjgren's. 8. Pneumonia during hospitalization July 2015.  9. Abdominal abscesses with body fluid culture 10/20/2013 showing moderate Candida tropicalis. She was discharged home on a 4 week course of fluconazole.  10. Mucositis and hand/foot syndrome following cycle 1-week #3 5-FU/leucovorin, the 5-FU and leucovorin were dose reduced. 11. 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 12. Status post LEEP 11/07/2013. Pathology shows moderate to severe dysplasia. Pap smear 06/08/2014-negative for intraepithelial lesions or  malignancy    Disposition:  She appears well. The plan is to continue observation. She will have a follow-up CT at [facility] on 08/20/2014. We  can then make a decision regarding salvage systemic therapy. She has microcytic anemia, likely related to iron deficiency from menses. She will begin ferrous sulfate and we will check a CBC when she returns next month.  Betsy Coder, MD  08/03/2014  9:51 AM

## 2014-08-20 ENCOUNTER — Telehealth: Payer: Self-pay | Admitting: *Deleted

## 2014-08-20 NOTE — Telephone Encounter (Signed)
Patient called reporting [facility] just called her reporting Dr. Fanny Skates will not be there for today's CT scans.  They can reschedule for September 03, 2014.  Would like to have them scheduled here.  Dr. Benay Spice notified.  Verbal order received and read back that we'll be glad to schedule here but [facility] ordered these scans for a reason to review.  Waiting until September 03, 2014 will not impact care.  Notified patient and she will reschedule scans at [facility] for September 03, 2014.

## 2014-09-07 ENCOUNTER — Ambulatory Visit (HOSPITAL_BASED_OUTPATIENT_CLINIC_OR_DEPARTMENT_OTHER): Payer: 59

## 2014-09-07 ENCOUNTER — Other Ambulatory Visit (HOSPITAL_BASED_OUTPATIENT_CLINIC_OR_DEPARTMENT_OTHER): Payer: 59

## 2014-09-07 ENCOUNTER — Telehealth: Payer: Self-pay | Admitting: Nurse Practitioner

## 2014-09-07 ENCOUNTER — Ambulatory Visit (HOSPITAL_BASED_OUTPATIENT_CLINIC_OR_DEPARTMENT_OTHER): Payer: 59 | Admitting: Nurse Practitioner

## 2014-09-07 VITALS — BP 107/71 | HR 95 | Temp 98.5°F | Resp 18 | Ht 66.0 in | Wt 158.2 lb

## 2014-09-07 DIAGNOSIS — C169 Malignant neoplasm of stomach, unspecified: Secondary | ICD-10-CM

## 2014-09-07 DIAGNOSIS — C163 Malignant neoplasm of pyloric antrum: Secondary | ICD-10-CM

## 2014-09-07 DIAGNOSIS — R109 Unspecified abdominal pain: Secondary | ICD-10-CM | POA: Diagnosis not present

## 2014-09-07 DIAGNOSIS — D509 Iron deficiency anemia, unspecified: Secondary | ICD-10-CM

## 2014-09-07 DIAGNOSIS — Z95828 Presence of other vascular implants and grafts: Secondary | ICD-10-CM

## 2014-09-07 LAB — CBC WITH DIFFERENTIAL/PLATELET
BASO%: 0.4 % (ref 0.0–2.0)
Basophils Absolute: 0 10*3/uL (ref 0.0–0.1)
EOS%: 1 % (ref 0.0–7.0)
Eosinophils Absolute: 0 10*3/uL (ref 0.0–0.5)
HCT: 28.3 % — ABNORMAL LOW (ref 34.8–46.6)
HGB: 9 g/dL — ABNORMAL LOW (ref 11.6–15.9)
LYMPH%: 29 % (ref 14.0–49.7)
MCH: 23.8 pg — ABNORMAL LOW (ref 25.1–34.0)
MCHC: 31.8 g/dL (ref 31.5–36.0)
MCV: 74.8 fL — AB (ref 79.5–101.0)
MONO#: 0.8 10*3/uL (ref 0.1–0.9)
MONO%: 17.7 % — ABNORMAL HIGH (ref 0.0–14.0)
NEUT#: 2.4 10*3/uL (ref 1.5–6.5)
NEUT%: 51.9 % (ref 38.4–76.8)
PLATELETS: 239 10*3/uL (ref 145–400)
RBC: 3.79 10*6/uL (ref 3.70–5.45)
RDW: 14.8 % — ABNORMAL HIGH (ref 11.2–14.5)
WBC: 4.6 10*3/uL (ref 3.9–10.3)
lymph#: 1.3 10*3/uL (ref 0.9–3.3)

## 2014-09-07 LAB — FERRITIN CHCC: Ferritin: 17 ng/ml (ref 9–269)

## 2014-09-07 MED ORDER — SODIUM CHLORIDE 0.9 % IJ SOLN
10.0000 mL | INTRAMUSCULAR | Status: DC | PRN
  Administered 2014-09-07: 10 mL via INTRAVENOUS
  Filled 2014-09-07: qty 10

## 2014-09-07 MED ORDER — HEPARIN SOD (PORK) LOCK FLUSH 100 UNIT/ML IV SOLN
500.0000 [IU] | Freq: Once | INTRAVENOUS | Status: AC
Start: 2014-09-07 — End: 2014-09-07
  Administered 2014-09-07: 500 [IU] via INTRAVENOUS
  Filled 2014-09-07: qty 5

## 2014-09-07 NOTE — Patient Instructions (Signed)

## 2014-09-07 NOTE — Progress Notes (Signed)
[place] OFFICE PROGRESS NOTE   Diagnosis:  Gastric cancer  INTERVAL HISTORY:   Andrea Best returns as scheduled. She feels well. No pain. No nausea vomiting. No diarrhea or constipation. She remains very active. She has a good appetite. No shortness breath, cough or fever.  Objective:  Vital signs in last 24 hours:  Blood pressure 107/71, pulse 95, temperature 98.5 F (36.9 C), temperature source Oral, resp. rate 18, height 5' 6"  (1.676 m), weight 158 lb 3.2 oz (71.759 kg), SpO2 100 %.    HEENT: No thrush or ulcers. Lymphatics: No palpable cervical, supra clavicular, axillary or inguinal lymph nodes. Resp: Lungs clear bilaterally. Cardio: Regular rate and rhythm. GI: Abdomen soft and nontender. No hepatomegaly. No mass. Vascular: No leg edema. Port-A-Cath without erythema.  Lab Results:  Lab Results  Component Value Date   WBC 4.0 08/03/2014   HGB 9.6* 08/03/2014   HCT 30.4* 08/03/2014   MCV 78.8* 08/03/2014   PLT 244 08/03/2014   NEUTROABS 1.8 08/03/2014    Imaging:  No results found.  Medications: I have reviewed the patient's current medications.  Assessment/Plan: 1. Gastric cancer status post upper endoscopy 09/22/2013 with findings of a partially obstructing oozing cratered gastric ulcer in the gastric antrum.  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   CT scans chest/abdomen/pelvis 10/03/2013 showed a 7 x 5 mm groundglass opacity in the superior segment of the right lower lobe; a 4.5 mm nodular density in the right upper lobe; bilateral axillary adenopathy; low density area anteriorly in the left hepatic lobe most consistent with fatty infiltration; another hypodense area within the medial segment of the left hepatic lobe; severe gastric distention; mild bilateral inguinal adenopathy.   Subtotal gastrectomy and lymph node dissection with creation of a gastrojejunostomy 10/10/2013. No evidence of  distant metastatic disease noted at the time of surgery. No evidence of liver metastases. 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 Tumor involved the serosal surface. Resection margins were negative. 10 of 17 lymph nodes were positive for metastatic adenocarcinoma. Her-2 not amplified   Cycle 1 weekly 5-FU/leucovorin 11/14/2013, dose reduced beginning with week 3 secondary to mucositis and hand/foot syndrome.   Cycle 2 weekly 5-FU/leucovorin beginning 12/19/2013.  Initiation of radiation/Xeloda 01/26/2014. She decided to discontinue further radiation/Xeloda after one fraction due to significant nausea/vomiting.  Cycle 3 weekly 5-FU/leucovorin beginning 02/13/2014  Cycle 4 weekly 5-FU/leucovorin beginning 03/18/2014  Cycle 5 weekly 5-FU/leucovorin beginning 04/28/2014  CTs 06/16/2014 with new enhancing nodular lesions along the ventral peritoneal surface  CT-guided biopsy of an anterior peritoneal lesion on 06/24/2014 confirmed poorly differentiated adenocarcinoma  CTs 09/03/2014 with an increasing confluent enhancing lesion located at the ventral peritoneal surface midline of the anterior abdomen. Lesion inseparable from the posterior aspect of the abdominal wall. 2. Gastric outlet obstruction secondary to #1. Resolved. 3. Nausea/vomiting secondary to #2. Resolved. 4. Abdominal pain secondary to #1 and #2. Improved. 5. History of Weight loss secondary to #1 and #2. 6. Microcytic anemia, likely iron deficiency. She received iron dextran 10/11/2013. 7. Lupus/Sjgren's. 8. Pneumonia during hospitalization July 2015.  9. Abdominal abscesses with body fluid culture 10/20/2013 showing moderate Candida tropicalis. She was discharged home on a 4 week course of fluconazole.  10. Mucositis and hand/foot syndrome following cycle 1-week #3 5-FU/leucovorin, the 5-FU and leucovorin were dose reduced. 11. Mild neutropenia-likely a benign normal variant or autoimmune  neutropenia complicated by chemotherapy. 12. Status post LEEP 11/07/2013. Pathology shows moderate to severe dysplasia. Pap smear  06/08/2014-negative for intraepithelial lesions or malignancy     Disposition: [name] appears well. She remains asymptomatic from the gastric cancer. The plan is for continued observation. She will return for a follow-up visit and Port-A-Cath flush in 6 weeks. She will contact the office in the interim with any problems.  Patient seen with Dr. Benay Spice.    Ned Card ANP/GNP-BC   09/07/2014  9:40 AM

## 2014-09-07 NOTE — Telephone Encounter (Signed)
Spoke with patient and she is aware of her  6week follow up

## 2014-09-08 ENCOUNTER — Telehealth: Payer: Self-pay | Admitting: *Deleted

## 2014-09-08 NOTE — Telephone Encounter (Signed)
Called patient to see if she is taking oral iron.  Patient stated she is taking a vegan iron supplement once daily.  Informed patient that Elby Showers. Marcello Moores, NP would like for patient to take ferrous sulfate twice daily.  Patient verbalized understanding.

## 2014-09-08 NOTE — Telephone Encounter (Signed)
-----   Message from Owens Shark, NP sent at 09/08/2014  9:50 AM EDT ----- Is she is taking oral iron? Ferrous sulfate is on her list twice a day.

## 2014-09-16 ENCOUNTER — Inpatient Hospital Stay
Admission: RE | Admit: 2014-09-16 | Discharge: 2014-09-16 | Disposition: A | Discharge status: Home / Self Care | Payer: Self-pay | Source: Ambulatory Visit | Attending: Oncology | Admitting: Oncology

## 2014-09-16 ENCOUNTER — Other Ambulatory Visit (HOSPITAL_COMMUNITY): Payer: Self-pay

## 2014-09-16 ENCOUNTER — Other Ambulatory Visit: Payer: Self-pay | Admitting: Oncology

## 2014-09-16 DIAGNOSIS — C169 Malignant neoplasm of stomach, unspecified: Secondary | ICD-10-CM

## 2014-09-17 ENCOUNTER — Other Ambulatory Visit: Payer: Self-pay | Admitting: *Deleted

## 2014-09-17 ENCOUNTER — Telehealth: Payer: Self-pay | Admitting: *Deleted

## 2014-09-17 MED ORDER — HYDROXYCHLOROQUINE SULFATE 200 MG PO TABS
200.0000 mg | ORAL_TABLET | Freq: Two times a day (BID) | ORAL | Status: DC
Start: 2014-09-17 — End: 2015-01-11

## 2014-09-17 NOTE — Telephone Encounter (Signed)
Patient called requesting "Dr. Benay Spice refill a medication for her.  I need Plaquenil refilled.  Originally prescribed by Dr. Ouida Sills with [facility].  I have an outstanding balance with them so they won't refill it."  Reports her "PCP arranged visit with Dr. Ouida Sills, did not make referral through insurance, was billed and despite this being corrected [facility] will not pay.  Trying to make payment arrangements."  Return number [phone].  Uses [facility] on [address].  Routed to Dr. Benay Spice for review.           Plaquenil 200 mg take twice a day confirmed with patient.

## 2014-09-17 NOTE — Telephone Encounter (Signed)
Per Dr. Benay Spice; notified pt re: "would Dr. Benay Spice refill Plaquenil"  Dr. Benay Spice will re-fill once and pt will need to get next re-fill from Dr. Ouida Sills.  Pt verbalized understanding and expressed appreciation.

## 2014-09-18 NOTE — Telephone Encounter (Signed)
Dr. Benay Spice authorized one refill.  Future refills from Dr. Ouida Sills per collaborative documenation.

## 2014-10-12 ENCOUNTER — Other Ambulatory Visit: Payer: Self-pay | Admitting: Oncology

## 2014-10-19 ENCOUNTER — Telehealth: Payer: Self-pay | Admitting: Oncology

## 2014-10-19 ENCOUNTER — Ambulatory Visit (HOSPITAL_BASED_OUTPATIENT_CLINIC_OR_DEPARTMENT_OTHER): Payer: 59 | Admitting: Oncology

## 2014-10-19 ENCOUNTER — Ambulatory Visit (HOSPITAL_BASED_OUTPATIENT_CLINIC_OR_DEPARTMENT_OTHER): Payer: 59

## 2014-10-19 ENCOUNTER — Other Ambulatory Visit (HOSPITAL_BASED_OUTPATIENT_CLINIC_OR_DEPARTMENT_OTHER): Payer: 59

## 2014-10-19 VITALS — BP 104/73 | HR 94 | Temp 98.5°F | Resp 16 | Ht 66.0 in | Wt 152.1 lb

## 2014-10-19 DIAGNOSIS — C163 Malignant neoplasm of pyloric antrum: Secondary | ICD-10-CM | POA: Diagnosis not present

## 2014-10-19 DIAGNOSIS — C169 Malignant neoplasm of stomach, unspecified: Secondary | ICD-10-CM

## 2014-10-19 DIAGNOSIS — R109 Unspecified abdominal pain: Secondary | ICD-10-CM | POA: Diagnosis not present

## 2014-10-19 DIAGNOSIS — D509 Iron deficiency anemia, unspecified: Secondary | ICD-10-CM

## 2014-10-19 DIAGNOSIS — Z95828 Presence of other vascular implants and grafts: Secondary | ICD-10-CM

## 2014-10-19 LAB — CBC WITH DIFFERENTIAL/PLATELET
BASO%: 0.5 % (ref 0.0–2.0)
BASOS ABS: 0 10*3/uL (ref 0.0–0.1)
EOS%: 1.4 % (ref 0.0–7.0)
Eosinophils Absolute: 0.1 10*3/uL (ref 0.0–0.5)
HEMATOCRIT: 30.9 % — AB (ref 34.8–46.6)
HEMOGLOBIN: 9.9 g/dL — AB (ref 11.6–15.9)
LYMPH#: 1.2 10*3/uL (ref 0.9–3.3)
LYMPH%: 23.9 % (ref 14.0–49.7)
MCH: 25.2 pg (ref 25.1–34.0)
MCHC: 32.1 g/dL (ref 31.5–36.0)
MCV: 78.4 fL — AB (ref 79.5–101.0)
MONO#: 0.7 10*3/uL (ref 0.1–0.9)
MONO%: 14.3 % — ABNORMAL HIGH (ref 0.0–14.0)
NEUT#: 3.1 10*3/uL (ref 1.5–6.5)
NEUT%: 59.9 % (ref 38.4–76.8)
Platelets: 230 10*3/uL (ref 145–400)
RBC: 3.94 10*6/uL (ref 3.70–5.45)
RDW: 20.4 % — AB (ref 11.2–14.5)
WBC: 5.1 10*3/uL (ref 3.9–10.3)

## 2014-10-19 MED ORDER — SODIUM CHLORIDE 0.9 % IJ SOLN
10.0000 mL | INTRAMUSCULAR | Status: DC | PRN
  Administered 2014-10-19: 10 mL via INTRAVENOUS
  Filled 2014-10-19: qty 10

## 2014-10-19 MED ORDER — HEPARIN SOD (PORK) LOCK FLUSH 100 UNIT/ML IV SOLN
500.0000 [IU] | Freq: Once | INTRAVENOUS | Status: AC
  Administered 2014-10-19: 500 [IU] via INTRAVENOUS
  Filled 2014-10-19: qty 5

## 2014-10-19 NOTE — Progress Notes (Signed)
[facility] OFFICE PROGRESS NOTE   Diagnosis: Gastric cancer  INTERVAL HISTORY:   She returns as scheduled. She reports early satiety. She is exercising regularly. She has developed pain with dorsi flexion at the left foot. No known trauma. No abdominal pain, nausea, or bleeding. She is taking iron.  Objective:  Vital signs in last 24 hours:  Blood pressure 104/73, pulse 94, temperature 98.5 F (36.9 C), temperature source Oral, resp. rate 16, height 5' 6" (1.676 m), weight 152 lb 1.6 oz (68.992 kg), SpO2 100 %.    HEENT: Neck without mass Lymphatics: No cervical or supra-clavicular nodes Resp: Lungs clear bilaterally Cardio: Regular rate and rhythm GI: No hepatomegaly, nontender, no mass, no apparent ascites Vascular: No leg edema Musculoskeletal: Limited range of motion with dorsi flexion at the left foot, no edema, no erythema.  Neurologic: Normal strength with plantar flexion and a eversion/inversion at the left foot   Portacath/PICC-without erythema  Lab Results:  Lab Results  Component Value Date   WBC 5.1 10/19/2014   HGB 9.9* 10/19/2014   HCT 30.9* 10/19/2014   MCV 78.4* 10/19/2014   PLT 230 10/19/2014   NEUTROABS 3.1 10/19/2014    Lab Results  Component Value Date   NA 138 06/16/2014    No results found for: CEA  Imaging:  No results found.  Medications: I have reviewed the patient's current medications.  Assessment/Plan: 1. Gastric cancer status post upper endoscopy 09/22/2013 with findings of a partially obstructing oozing cratered gastric ulcer in the gastric antrum.  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   CT scans chest/abdomen/pelvis 10/03/2013 showed a 7 x 5 mm groundglass opacity in the superior segment of the right lower lobe; a 4.5 mm nodular density in the right upper lobe; bilateral axillary adenopathy; low density area anteriorly in the left hepatic lobe most consistent with  fatty infiltration; another hypodense area within the medial segment of the left hepatic lobe; severe gastric distention; mild bilateral inguinal adenopathy.   Subtotal gastrectomy and lymph node dissection with creation of a gastrojejunostomy 10/10/2013. No evidence of distant metastatic disease noted at the time of surgery. No evidence of liver metastases. 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 Tumor involved the serosal surface. Resection margins were negative. 10 of 17 lymph nodes were positive for metastatic adenocarcinoma. Her-2 not amplified   Cycle 1 weekly 5-FU/leucovorin 11/14/2013, dose reduced beginning with week 3 secondary to mucositis and hand/foot syndrome.   Cycle 2 weekly 5-FU/leucovorin beginning 12/19/2013.  Initiation of radiation/Xeloda 01/26/2014. She decided to discontinue further radiation/Xeloda after one fraction due to significant nausea/vomiting.  Cycle 3 weekly 5-FU/leucovorin beginning 02/13/2014  Cycle 4 weekly 5-FU/leucovorin beginning 03/18/2014  Cycle 5 weekly 5-FU/leucovorin beginning 04/28/2014  CTs 06/16/2014 with new enhancing nodular lesions along the ventral peritoneal surface  CT-guided biopsy of an anterior peritoneal lesion on 06/24/2014 confirmed poorly differentiated adenocarcinoma  CTs 09/03/2014 with an increasing confluent enhancing lesion located at the ventral peritoneal surface midline of the anterior abdomen. Lesion inseparable from the posterior aspect of the abdominal wall. 2. Gastric outlet obstruction secondary to #1. Resolved. 3. Nausea/vomiting secondary to #2. Resolved. 4. Abdominal pain secondary to #1 and #2. Improved. 5. History of Weight loss secondary to #1 and #2. 6. Microcytic anemia, likely iron deficiency. She received iron dextran 10/11/2013. 7. Lupus/Sjgren's. 8. Pneumonia during hospitalization July 2015.  9. Abdominal abscesses with body fluid culture 10/20/2013 showing  moderate Candida tropicalis. She was discharged home on a 4  week course of fluconazole.  10. Mucositis and hand/foot syndrome following cycle 1-week #3 5-FU/leucovorin, the 5-FU and leucovorin were dose reduced. 11. 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 12. Status post LEEP 11/07/2013. Pathology shows moderate to severe dysplasia. Pap smear 06/08/2014-negative for intraepithelial lesions or malignancy   Disposition:  Her overall condition appears unchanged. The early satiety is likely related to gastric cancer, no other evidence of disease progression. She will meet with the Cancer center nutritionist. I recommended she continue iron. Andrea Best will return for an office visit and restaging CT evaluation in early September. She will contact us in the interim as needed.  The left foot discomfort is likely related to a benign musculoskeletal condition.  Betsy Coder, MD  10/19/2014  12:55 PM

## 2014-10-19 NOTE — Patient Instructions (Signed)

## 2014-10-19 NOTE — Telephone Encounter (Signed)
Lft msg for pt confirming labs/ov/flush, mailed schedule to pt, also advised pt she will need to come by to p/u barium

## 2014-10-20 LAB — CEA: CEA: 2.6 ng/mL (ref 0.0–5.0)

## 2014-11-13 ENCOUNTER — Telehealth: Payer: Self-pay | Admitting: Oncology

## 2014-11-13 ENCOUNTER — Telehealth: Payer: Self-pay | Admitting: Nurse Practitioner

## 2014-11-13 ENCOUNTER — Encounter: Payer: Self-pay | Admitting: Nurse Practitioner

## 2014-11-13 ENCOUNTER — Ambulatory Visit (HOSPITAL_BASED_OUTPATIENT_CLINIC_OR_DEPARTMENT_OTHER): Payer: 59 | Admitting: Nurse Practitioner

## 2014-11-13 ENCOUNTER — Other Ambulatory Visit (HOSPITAL_BASED_OUTPATIENT_CLINIC_OR_DEPARTMENT_OTHER): Payer: 59

## 2014-11-13 VITALS — BP 106/55 | HR 105 | Temp 99.3°F | Resp 16 | Wt 146.2 lb

## 2014-11-13 DIAGNOSIS — C169 Malignant neoplasm of stomach, unspecified: Secondary | ICD-10-CM | POA: Diagnosis not present

## 2014-11-13 DIAGNOSIS — R740 Nonspecific elevation of levels of transaminase and lactic acid dehydrogenase [LDH]: Secondary | ICD-10-CM

## 2014-11-13 DIAGNOSIS — R197 Diarrhea, unspecified: Secondary | ICD-10-CM | POA: Diagnosis not present

## 2014-11-13 DIAGNOSIS — R112 Nausea with vomiting, unspecified: Secondary | ICD-10-CM

## 2014-11-13 DIAGNOSIS — R630 Anorexia: Secondary | ICD-10-CM | POA: Diagnosis not present

## 2014-11-13 DIAGNOSIS — R7401 Elevation of levels of liver transaminase levels: Secondary | ICD-10-CM

## 2014-11-13 DIAGNOSIS — R634 Abnormal weight loss: Secondary | ICD-10-CM | POA: Diagnosis not present

## 2014-11-13 DIAGNOSIS — R530 Neoplastic (malignant) related fatigue: Secondary | ICD-10-CM

## 2014-11-13 LAB — COMPREHENSIVE METABOLIC PANEL (CC13)
ALT: 172 U/L — AB (ref 0–55)
AST: 214 U/L (ref 5–34)
Albumin: 2.8 g/dL — ABNORMAL LOW (ref 3.5–5.0)
Alkaline Phosphatase: 457 U/L — ABNORMAL HIGH (ref 40–150)
Anion Gap: 9 mEq/L (ref 3–11)
BILIRUBIN TOTAL: 2.38 mg/dL — AB (ref 0.20–1.20)
BUN: 7.4 mg/dL (ref 7.0–26.0)
CALCIUM: 9.3 mg/dL (ref 8.4–10.4)
CHLORIDE: 107 meq/L (ref 98–109)
CO2: 21 meq/L — AB (ref 22–29)
CREATININE: 0.8 mg/dL (ref 0.6–1.1)
Glucose: 94 mg/dl (ref 70–140)
Potassium: 4.1 mEq/L (ref 3.5–5.1)
Sodium: 137 mEq/L (ref 136–145)
Total Protein: 8.6 g/dL — ABNORMAL HIGH (ref 6.4–8.3)

## 2014-11-13 LAB — CBC WITH DIFFERENTIAL/PLATELET
BASO%: 0.5 % (ref 0.0–2.0)
BASOS ABS: 0 10*3/uL (ref 0.0–0.1)
EOS ABS: 0.1 10*3/uL (ref 0.0–0.5)
EOS%: 0.8 % (ref 0.0–7.0)
HEMATOCRIT: 32.8 % — AB (ref 34.8–46.6)
HGB: 10.9 g/dL — ABNORMAL LOW (ref 11.6–15.9)
LYMPH#: 1.5 10*3/uL (ref 0.9–3.3)
LYMPH%: 23.3 % (ref 14.0–49.7)
MCH: 26.3 pg (ref 25.1–34.0)
MCHC: 33.2 g/dL (ref 31.5–36.0)
MCV: 79.1 fL — AB (ref 79.5–101.0)
MONO#: 0.8 10*3/uL (ref 0.1–0.9)
MONO%: 11.9 % (ref 0.0–14.0)
NEUT#: 4 10*3/uL (ref 1.5–6.5)
NEUT%: 63.5 % (ref 38.4–76.8)
Platelets: 207 10*3/uL (ref 145–400)
RBC: 4.15 10*6/uL (ref 3.70–5.45)
RDW: 20.9 % — ABNORMAL HIGH (ref 11.2–14.5)
WBC: 6.3 10*3/uL (ref 3.9–10.3)

## 2014-11-13 NOTE — Assessment & Plan Note (Signed)
Bilirubin has increased from 0.56 up to 2.38.  The plan is to obtain a restaging CT next week for further evaluation.  Will continue to monitor closely.

## 2014-11-13 NOTE — Assessment & Plan Note (Signed)
AST has increased to 214 and ALT has increased to 172.  Please see previous note regarding elevated/abnormal labs and plan for restaging CT.

## 2014-11-13 NOTE — Telephone Encounter (Signed)
S/w pt confirming labs/ov with NP/CB per 08/19 POF.... Andrea Best

## 2014-11-13 NOTE — Telephone Encounter (Signed)
Labs/flush/ov per 08/19 POF added with NP/CB. Pt is aware... KJ

## 2014-11-13 NOTE — Assessment & Plan Note (Signed)
Alkaline phosphatase has increased from 62 up to 457.  Patient is also complaining of increased GI symptoms within this past week or so.  Will obtain a restaging CT week for further evaluation.

## 2014-11-13 NOTE — Assessment & Plan Note (Signed)
Patient has had decreased appetite and weight loss of approximate 5-6 pounds within this last month.  She is also complaining of feeling full with only a few bites of food.  Patient was encouraged to eat multiple small meals throughout the day; and to push protein.  Patient was obtain a restaging CT next week for further evaluation.

## 2014-11-13 NOTE — Assessment & Plan Note (Signed)
Patient is status post subtotal gastrectomy and chemotherapy in the past.  Currently undergoing observation only.  Patient's last restaging scan here at the cancer center was obtained in March 2016.  She also had a restaging scan per [facility] in June 2016.  Patient is now complaining of a week long history of nausea, vomiting, diarrhea, increased fatigue, decreased appetite, and weight loss.  Labs obtained today did reveal elevated bilirubin, alkaline phosphatase, and transaminitis.  Exam obtained today revealed abdomen was soft and essentially nontender.  Sounds positive in all 4 quads.  No rebound tenderness or obvious palpable masses.  Concern with these newly presenting symptoms is recurrence of disease.  Will obtain a CT with contrast of the abdomen and pelvis next week for further evaluation.  Will schedule labs and a follow-up visit to review scan results as soon as possible after obtaining a restaging scan.

## 2014-11-13 NOTE — Telephone Encounter (Signed)
Complains of feeling fatigue and weak.  Reported having nausea and vomiting.  Instructed to come see Selena Lesser.

## 2014-11-13 NOTE — Telephone Encounter (Signed)
Opened in error

## 2014-11-13 NOTE — Assessment & Plan Note (Signed)
Patient is complaining of some intermittent diarrhea of within this past week.  She has not tried over-the-counter Imodium as of yet.  Patient was encouraged to try the Imodium as directed.  She was also encouraged to push fluids is much as possible.  Patient stated that she did not feel dehydrated enough today to receive IV fluid rehydration.

## 2014-11-13 NOTE — Progress Notes (Signed)
SYMPTOM MANAGEMENT CLINIC   HPI: Andrea Best 44 y.o. female diagnosed with gastric cancer.  Patient is status post subtotal gastrectomy and chemotherapy in the past.  Currently undergoing observation only.  Patient's last restaging scan here at the cancer center was obtained in March 2016.  She also had a restaging scan per [facility] in June 2016.  Patient is now complaining of a week long history of nausea, vomiting, diarrhea, increased fatigue, decreased appetite, and weight loss.  Labs obtained today did reveal elevated bilirubin, alkaline phosphatase, and transaminitis.  Patient denies any UTI symptoms.  Patient denies any recent fevers or chills.  HPI  ROS  Past Medical History  Diagnosis Date  . Eczema   . Lupus     joint pain and extreme fatique - improved after plaquenil and prednisone therapy  . Sjogren's disease     caused pt to loose her teeth due to dry mouth;  prone to eye irritation and infection due to dry eye; affects immune system - PRONE TO INFECTIONS   . Allergy   . Tachycardia   . Protein-calorie malnutrition, severe   . Abnormal findings on esophagogastroduodenoscopy (EGD) 09/22/13    esophagitis  . Asthma     no recent flare ups - no inhalers - as of 11/19/13  . GERD (gastroesophageal reflux disease)   . Anemia     iron deficiency   . Pneumonia     health care -associated July 2015 post op gastrectomy surgery  . 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    Past Surgical History  Procedure Laterality Date  . Cesarean section      1997  . Laparotomy N/A 10/10/2013    Procedure: SUBTOTAL GASTRECTOMY WITH GASTRIC JEJUNOSOTMY;  Surgeon: Edward Jolly, MD;  Location: [facility];  Service: General;  Laterality: N/A;  . Portacath placement Right 11/20/2013    Procedure: INSERTION PORT-A-CATH with ULTRA SOUND;  Surgeon: Adin Hector, MD;  Location: [facility];   Service: General;  Laterality: Right;    has Joint pain; Stomach pain; Gastric cancer; Gastric outlet obstruction; Anemia, iron deficiency; Protein-calorie malnutrition, severe; Hypomagnesemia; Hyperphosphatemia; Fever, unspecified; Tachycardia; Leucocytosis; HCAP (healthcare-associated pneumonia); Candida tropicalis infection; HAP (hospital-acquired pneumonia); Cancer of antrum of stomach; URI (upper respiratory infection); Hand foot syndrome; Neuropathy due to chemotherapeutic drug; Anorexia; Hyperbilirubinemia; Transaminitis; Neoplastic malignant related fatigue; Weight loss; and Diarrhea on her problem list.    is allergic to compazine; azithromycin; sulfa antibiotics; hydrocodone-acetaminophen; and other.    Medication List       This list is accurate as of: 11/13/14  5:28 PM.  Always use your most recent med list.               ferrous sulfate 325 (65 FE) MG tablet  Take 325 mg by mouth 2 (two) times daily with a meal.     hydroxychloroquine 200 MG tablet  Commonly known as:  PLAQUENIL  Take 1 tablet (200 mg total) by mouth 2 (two) times daily.     ibuprofen 200 MG tablet  Commonly known as:  ADVIL,MOTRIN  Take 200-600 mg by mouth every 6 (six) hours as needed for headache or moderate pain.     lidocaine-prilocaine cream  Commonly known as:  EMLA  Appy small amount of cream over port site 1-2 hours prior to treatment and cover with plastic wrap.  DO NOT RUB IN  predniSONE 5 MG tablet  Commonly known as:  DELTASONE  Take 5 mg by mouth daily with breakfast.         PHYSICAL EXAMINATION  Oncology Vitals 11/13/2014 10/19/2014 09/07/2014 08/03/2014 07/09/2014 06/24/2014 06/24/2014  Height - 168 cm 168 cm 168 cm 168 cm - -  Weight 66.316 kg 68.992 kg 71.759 kg 73.029 kg 73.392 kg - -  Weight (lbs) 146 lbs 3 oz 152 lbs 2 oz 158 lbs 3 oz 161 lbs 161 lbs 13 oz - -  BMI (kg/m2) - 24.55 kg/m2 25.53 kg/m2 25.99 kg/m2 26.12 kg/m2 - -  Temp 99.3 98.5 98.5 98.2 97.8 98.1 98  Pulse 105  94 95 78 91 80 93  Resp 16 16 18 20 18 17 16   SpO2 100 100 100 100 100 100 100  BSA (m2) - 1.79 m2 1.83 m2 1.84 m2 1.85 m2 - -   BP Readings from Last 3 Encounters:  11/13/14 106/55  10/19/14 104/73  09/07/14 107/71    Physical Exam  Constitutional: She is oriented to person, place, and time and well-developed, well-nourished, and in no distress.  HENT:  Head: Normocephalic and atraumatic.  Mouth/Throat: Oropharynx is clear and moist.  Eyes: Conjunctivae and EOM are normal. Pupils are equal, round, and reactive to light. Right eye exhibits no discharge. Left eye exhibits no discharge. No scleral icterus.  Neck: Normal range of motion. Neck supple.  Pulmonary/Chest: Effort normal. No respiratory distress.  Abdominal: Soft. She exhibits no distension and no mass. There is no tenderness. There is no rebound and no guarding.  Musculoskeletal: Normal range of motion. She exhibits no edema or tenderness.  Neurological: She is alert and oriented to person, place, and time. Gait normal.  Skin: Skin is warm and dry.  Psychiatric: Affect normal.  Nursing note and vitals reviewed.   LABORATORY DATA:. Appointment on 11/13/2014  Component Date Value Ref Range Status  . WBC 11/13/2014 6.3  3.9 - 10.3 10e3/uL Final  . NEUT# 11/13/2014 4.0  1.5 - 6.5 10e3/uL Final  . HGB 11/13/2014 10.9* 11.6 - 15.9 g/dL Final  . HCT 11/13/2014 32.8* 34.8 - 46.6 % Final  . Platelets 11/13/2014 207  145 - 400 10e3/uL Final  . MCV 11/13/2014 79.1* 79.5 - 101.0 fL Final  . MCH 11/13/2014 26.3  25.1 - 34.0 pg Final  . MCHC 11/13/2014 33.2  31.5 - 36.0 g/dL Final  . RBC 11/13/2014 4.15  3.70 - 5.45 10e6/uL Final  . RDW 11/13/2014 20.9* 11.2 - 14.5 % Final  . lymph# 11/13/2014 1.5  0.9 - 3.3 10e3/uL Final  . MONO# 11/13/2014 0.8  0.1 - 0.9 10e3/uL Final  . Eosinophils Absolute 11/13/2014 0.1  0.0 - 0.5 10e3/uL Final  . Basophils Absolute 11/13/2014 0.0  0.0 - 0.1 10e3/uL Final  . NEUT% 11/13/2014 63.5  38.4 -  76.8 % Final  . LYMPH% 11/13/2014 23.3  14.0 - 49.7 % Final  . MONO% 11/13/2014 11.9  0.0 - 14.0 % Final  . EOS% 11/13/2014 0.8  0.0 - 7.0 % Final  . BASO% 11/13/2014 0.5  0.0 - 2.0 % Final  . Sodium 11/13/2014 137  136 - 145 mEq/L Final  . Potassium 11/13/2014 4.1  3.5 - 5.1 mEq/L Final  . Chloride 11/13/2014 107  98 - 109 mEq/L Final  . CO2 11/13/2014 21* 22 - 29 mEq/L Final  . Glucose 11/13/2014 94  70 - 140 mg/dl Final  . BUN 11/13/2014 7.4  7.0 - 26.0 mg/dL  Final  . Creatinine 11/13/2014 0.8  0.6 - 1.1 mg/dL Final  . Total Bilirubin 11/13/2014 2.38* 0.20 - 1.20 mg/dL Final  . Alkaline Phosphatase 11/13/2014 457* 40 - 150 U/L Final  . AST 11/13/2014 214* 5 - 34 U/L Final  . ALT 11/13/2014 172* 0 - 55 U/L Final  . Total Protein 11/13/2014 8.6* 6.4 - 8.3 g/dL Final  . Albumin 11/13/2014 2.8* 3.5 - 5.0 g/dL Final  . Calcium 11/13/2014 9.3  8.4 - 10.4 mg/dL Final  . Anion Gap 11/13/2014 9  3 - 11 mEq/L Final  . EGFR 11/13/2014 >90  >90 ml/min/1.73 m2 Final   eGFR is calculated using the CKD-EPI Creatinine Equation (2009)     RADIOGRAPHIC STUDIES: No results found.  ASSESSMENT/PLAN:    Anorexia Patient is complaining of decreased appetite and feeling of fullness with only a small amount of food.  She has lost weight since her last weight check.  Patient was encouraged to eat multiple small meals to the day if at all possible; and to push protein.  Diarrhea Patient is complaining of some intermittent diarrhea of within this past week.  She has not tried over-the-counter Imodium as of yet.  Patient was encouraged to try the Imodium as directed.  She was also encouraged to push fluids is much as possible.  Patient stated that she did not feel dehydrated enough today to receive IV fluid rehydration.  Gastric cancer Patient is status post subtotal gastrectomy and chemotherapy in the past.  Currently undergoing observation only.  Patient's last restaging scan here at the cancer center  was obtained in March 2016.  She also had a restaging scan per [facility] in June 2016.  Patient is now complaining of a week long history of nausea, vomiting, diarrhea, increased fatigue, decreased appetite, and weight loss.  Labs obtained today did reveal elevated bilirubin, alkaline phosphatase, and transaminitis.  Exam obtained today revealed abdomen was soft and essentially nontender.  Sounds positive in all 4 quads.  No rebound tenderness or obvious palpable masses.  Concern with these newly presenting symptoms is recurrence of disease.  Will obtain a CT with contrast of the abdomen and pelvis next week for further evaluation.  Will schedule labs and a follow-up visit to review scan results as soon as possible after obtaining a restaging scan.  Hyperbilirubinemia Bilirubin has increased from 0.56 up to 2.38.  The plan is to obtain a restaging CT next week for further evaluation.  Will continue to monitor closely.  Hyperphosphatemia Alkaline phosphatase has increased from 62 up to 457.  Patient is also complaining of increased GI symptoms within this past week or so.  Will obtain a restaging CT week for further evaluation.  Neoplastic malignant related fatigue Patient is complaining of increased generalized fatigue; and symptoms of nausea, vomiting, and diarrhea within this past week.  Labs obtained today did reveal elevated bilirubin, alkaline phosphatase, and liver enzymes.  Will obtain a restaging scan next week for further evaluation.  In the meantime-patient was advised to drink plenty of fluids and remain as active as possible.  Patient was advised to call/return or go directly to the emergency department for any worsening symptoms whatsoever.  Transaminitis AST has increased to 214 and ALT has increased to 172.  Please see previous note regarding elevated/abnormal labs and plan for restaging CT.  Weight loss Patient has had decreased appetite and weight loss of  approximate 5-6 pounds within this last month.  She is also complaining of feeling  full with only a few bites of food.  Patient was encouraged to eat multiple small meals throughout the day; and to push protein.  Patient was obtain a restaging CT next week for further evaluation.  Patient stated understanding of all instructions; and was in agreement with this plan of care. The patient knows to call the clinic with any problems, questions or concerns.   This was a shared visit with Dr. Benay Spice today.  Total time spent with patient was 25 minutes;  with greater than 75 percent of that time spent in face to face counseling regarding patient's symptoms,  and coordination of care and follow up.  Disclaimer: This note was dictated with voice recognition software. Similar sounding words can inadvertently be transcribed and may not be corrected upon review.   Drue Second, NP 11/13/2014   This was a shared visit with Drue Second.  Ms. Glasby was interviewed and examined. We are concerned her symptoms are related to progressive gastric cancer. She will be referred for a CT abdomen/pelvis and return for an office visit next week.  Julieanne Manson, MD

## 2014-11-13 NOTE — Assessment & Plan Note (Signed)
Patient is complaining of increased generalized fatigue; and symptoms of nausea, vomiting, and diarrhea within this past week.  Labs obtained today did reveal elevated bilirubin, alkaline phosphatase, and liver enzymes.  Will obtain a restaging scan next week for further evaluation.  In the meantime-patient was advised to drink plenty of fluids and remain as active as possible.  Patient was advised to call/return or go directly to the emergency department for any worsening symptoms whatsoever.

## 2014-11-13 NOTE — Assessment & Plan Note (Signed)
Patient is complaining of decreased appetite and feeling of fullness with only a small amount of food.  She has lost weight since her last weight check.  Patient was encouraged to eat multiple small meals to the day if at all possible; and to push protein.

## 2014-11-16 ENCOUNTER — Other Ambulatory Visit: Payer: Self-pay | Admitting: Nurse Practitioner

## 2014-11-16 ENCOUNTER — Telehealth: Payer: Self-pay | Admitting: *Deleted

## 2014-11-16 ENCOUNTER — Other Ambulatory Visit: Payer: Self-pay | Admitting: *Deleted

## 2014-11-16 NOTE — Telephone Encounter (Signed)
Called patient with information that the appointment for 8-23-201 will be moved to 11-18-2014.

## 2014-11-16 NOTE — Telephone Encounter (Signed)
Lm to cancel appt on Tuesday for Labs and to see Cyndee and Dr. Benay Spice. Pt needs to be seen after her CT Scan- This was scheduled for Tuesday at 430. Per Cyndee pt can come in on Wednesday at [facility] for labs and add to her schedule for 930. LM with appt information and asked for a call back to confirm receipt.

## 2014-11-17 ENCOUNTER — Other Ambulatory Visit: Payer: 59

## 2014-11-17 ENCOUNTER — Ambulatory Visit: Payer: 59 | Admitting: Nurse Practitioner

## 2014-11-17 ENCOUNTER — Other Ambulatory Visit: Payer: Self-pay | Admitting: Nurse Practitioner

## 2014-11-17 ENCOUNTER — Other Ambulatory Visit: Payer: Self-pay | Admitting: *Deleted

## 2014-11-17 ENCOUNTER — Ambulatory Visit (HOSPITAL_COMMUNITY)
Admission: RE | Admit: 2014-11-17 | Discharge: 2014-11-17 | Disposition: A | Discharge status: Home / Self Care | Payer: 59 | Source: Ambulatory Visit | Attending: Nurse Practitioner | Admitting: Nurse Practitioner

## 2014-11-17 ENCOUNTER — Telehealth: Payer: Self-pay | Admitting: *Deleted

## 2014-11-17 DIAGNOSIS — C169 Malignant neoplasm of stomach, unspecified: Secondary | ICD-10-CM

## 2014-11-17 DIAGNOSIS — Z9221 Personal history of antineoplastic chemotherapy: Secondary | ICD-10-CM | POA: Diagnosis not present

## 2014-11-17 DIAGNOSIS — R160 Hepatomegaly, not elsewhere classified: Secondary | ICD-10-CM | POA: Diagnosis not present

## 2014-11-17 DIAGNOSIS — C7989 Secondary malignant neoplasm of other specified sites: Secondary | ICD-10-CM | POA: Insufficient documentation

## 2014-11-17 DIAGNOSIS — K869 Disease of pancreas, unspecified: Secondary | ICD-10-CM | POA: Diagnosis not present

## 2014-11-17 DIAGNOSIS — R05 Cough: Secondary | ICD-10-CM | POA: Diagnosis not present

## 2014-11-17 DIAGNOSIS — R0602 Shortness of breath: Secondary | ICD-10-CM | POA: Insufficient documentation

## 2014-11-17 MED ORDER — IOHEXOL 300 MG/ML  SOLN
100.0000 mL | Freq: Once | INTRAMUSCULAR | Status: AC | PRN
  Administered 2014-11-17: 100 mL via INTRAVENOUS

## 2014-11-17 NOTE — Telephone Encounter (Signed)
err

## 2014-11-18 ENCOUNTER — Other Ambulatory Visit: Payer: Self-pay | Admitting: Oncology

## 2014-11-18 ENCOUNTER — Other Ambulatory Visit: Payer: Self-pay | Admitting: Radiology

## 2014-11-18 ENCOUNTER — Telehealth: Payer: Self-pay | Admitting: *Deleted

## 2014-11-18 ENCOUNTER — Encounter: Payer: Self-pay | Admitting: Nurse Practitioner

## 2014-11-18 ENCOUNTER — Ambulatory Visit (HOSPITAL_BASED_OUTPATIENT_CLINIC_OR_DEPARTMENT_OTHER): Payer: 59 | Admitting: Nurse Practitioner

## 2014-11-18 ENCOUNTER — Other Ambulatory Visit (HOSPITAL_BASED_OUTPATIENT_CLINIC_OR_DEPARTMENT_OTHER): Payer: 59

## 2014-11-18 VITALS — BP 109/60 | HR 103 | Temp 97.9°F | Resp 16

## 2014-11-18 DIAGNOSIS — E8809 Other disorders of plasma-protein metabolism, not elsewhere classified: Secondary | ICD-10-CM

## 2014-11-18 DIAGNOSIS — R740 Nonspecific elevation of levels of transaminase and lactic acid dehydrogenase [LDH]: Secondary | ICD-10-CM

## 2014-11-18 DIAGNOSIS — R110 Nausea: Secondary | ICD-10-CM | POA: Diagnosis not present

## 2014-11-18 DIAGNOSIS — C169 Malignant neoplasm of stomach, unspecified: Secondary | ICD-10-CM

## 2014-11-18 DIAGNOSIS — R530 Neoplastic (malignant) related fatigue: Secondary | ICD-10-CM

## 2014-11-18 DIAGNOSIS — R7401 Elevation of levels of liver transaminase levels: Secondary | ICD-10-CM

## 2014-11-18 LAB — CBC WITH DIFFERENTIAL/PLATELET
BASO%: 0.2 % (ref 0.0–2.0)
BASOS ABS: 0 10*3/uL (ref 0.0–0.1)
EOS ABS: 0.1 10*3/uL (ref 0.0–0.5)
EOS%: 1.3 % (ref 0.0–7.0)
HEMATOCRIT: 33.5 % — AB (ref 34.8–46.6)
HEMOGLOBIN: 11.1 g/dL — AB (ref 11.6–15.9)
LYMPH%: 30.1 % (ref 14.0–49.7)
MCH: 26.6 pg (ref 25.1–34.0)
MCHC: 33.1 g/dL (ref 31.5–36.0)
MCV: 80.3 fL (ref 79.5–101.0)
MONO#: 0.6 10*3/uL (ref 0.1–0.9)
MONO%: 13.5 % (ref 0.0–14.0)
NEUT#: 2.6 10*3/uL (ref 1.5–6.5)
NEUT%: 54.9 % (ref 38.4–76.8)
Platelets: 205 10*3/uL (ref 145–400)
RBC: 4.17 10*6/uL (ref 3.70–5.45)
RDW: 19.5 % — ABNORMAL HIGH (ref 11.2–14.5)
WBC: 4.7 10*3/uL (ref 3.9–10.3)
lymph#: 1.4 10*3/uL (ref 0.9–3.3)

## 2014-11-18 LAB — COMPREHENSIVE METABOLIC PANEL (CC13)
ALBUMIN: 2.7 g/dL — AB (ref 3.5–5.0)
ALK PHOS: 487 U/L — AB (ref 40–150)
ALT: 167 U/L — AB (ref 0–55)
ANION GAP: 8 meq/L (ref 3–11)
AST: 222 U/L — AB (ref 5–34)
BUN: 4.9 mg/dL — AB (ref 7.0–26.0)
CALCIUM: 9.6 mg/dL (ref 8.4–10.4)
CO2: 25 mEq/L (ref 22–29)
CREATININE: 0.8 mg/dL (ref 0.6–1.1)
Chloride: 106 mEq/L (ref 98–109)
EGFR: >90 mL/min/{1.73_m2} (ref >90)
Glucose: 111 mg/dl (ref 70–140)
Potassium: 4.4 mEq/L (ref 3.5–5.1)
Sodium: 139 mEq/L (ref 136–145)
Total Bilirubin: 2.73 mg/dL — ABNORMAL HIGH (ref 0.20–1.20)
Total Protein: 8.6 g/dL — ABNORMAL HIGH (ref 6.4–8.3)

## 2014-11-18 LAB — CEA: CEA: 2.7 ng/mL (ref 0.0–5.0)

## 2014-11-18 NOTE — Assessment & Plan Note (Signed)
Patient continues to complain of some chronic fatigue.  OXY clear.  This is secondary to disease progression.  Patient was encouraged to push fluids and protein; and also to remain as active as possible.

## 2014-11-18 NOTE — Assessment & Plan Note (Signed)
Patient continues to complain of some mild, intermittent nausea; but no vomiting now.  Patient continues to take Zofran on an as-needed basis at home.  Hopefully, obtaining the biliary stent and biliary drain will resolve patient's intermittent nausea.

## 2014-11-18 NOTE — Assessment & Plan Note (Signed)
Patient is status post subtotal gastrectomy and chemotherapy in the past.  Currently undergoing observation only.  Patient's last restaging scan here at the cancer center was obtained in March 2016.  She also had a restaging scan per [facility] in June 2016.  Patient reports  week long history of nausea, vomiting, diarrhea, increased fatigue, decreased appetite, and weight loss.   Pt states that her nausea has improved within past few days however. She denies any fever or chills.   Restaging CT obtained today revealed:   IMPRESSION: 1. Significant progression in metastatic disease with enlarging metastasis within the anterior abdominal wall, enlargement of multiple peritoneal implants, an interval development of masses within the pancreatic head and caudate lobe of the liver. 2. 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 3. 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  CT scans were reviewed in detail per Dr. Benay Spice with both patient and her mother.  Dr. Benay Spice consulted with Dr. Paulita Fujita gastroenterology; and the plan is for the patient to have a biliary stent and biliary drain placed first thing in the morning.  Patient was instructed to remain nothing by mouth after midnight tonight for planned procedure.  Will reschedule patient for labs and a follow-up visit on 12/02/2014.

## 2014-11-18 NOTE — Assessment & Plan Note (Signed)
Both AST and ALT remain elevated.  Most likely, secondary to bile duct obstruction.  Patient is scheduled for a biliary stent and biliary drain first thing in the morning.

## 2014-11-18 NOTE — Assessment & Plan Note (Signed)
Bilirubin has increased from 2.38 up to 2.73 today.  Patient is scheduled for a biliary stent and biliary drain procedure first thing in the morning per Dr. Paulita Fujita.

## 2014-11-18 NOTE — Assessment & Plan Note (Signed)
Alkaline phosphatase has increased from 457 up to 487.  Most likely, this is secondary to bile duct obstruction.  Patient is scheduled for biliary stent in biliary drain procedure first thing in the morning.

## 2014-11-18 NOTE — Progress Notes (Signed)
SYMPTOM MANAGEMENT CLINIC   HPI: Andrea Best 44 y.o. female diagnosed with gastric cancer.  Patient is status post subtotal gastrectomy and chemotherapy in the past.  Currently undergoing observation only.  Patient's last restaging scan here at the cancer center was obtained in March 2016.  She also had a restaging scan per [facility] in June 2016.  Patient reports  week long history of nausea, vomiting, diarrhea, increased fatigue, decreased appetite, and weight loss.   Pt states that her nausea has improved within past few days however. She denies any fever or chills.   Restaging CT obtained today revealed:   IMPRESSION: 1. Significant progression in metastatic disease with enlarging metastasis within the anterior abdominal wall, enlargement of multiple peritoneal implants, an interval development of masses within the pancreatic head and caudate lobe of the liver. 2. 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 3. 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  CT scans were reviewed in detail per Dr. Benay Spice with both patient and her mother.  Dr. Benay Spice consulted with Dr. Paulita Fujita gastroenterology; and the plan is for the patient to have a biliary stent and biliary drain placed first thing in the morning.  Patient was instructed to remain nothing by mouth after midnight tonight for planned procedure.  Will reschedule patient for labs and a follow-up visit on 12/02/2014.  HPI  ROS  Past Medical History  Diagnosis Date  . Eczema   . Lupus     joint pain and extreme fatique - improved after plaquenil and prednisone therapy  . Sjogren's disease     caused pt to loose her teeth due to dry mouth;  prone to eye irritation and infection due to dry eye;  affects immune system - PRONE TO INFECTIONS   . Allergy   . Tachycardia   . Protein-calorie malnutrition, severe   . Abnormal findings on esophagogastroduodenoscopy (EGD) 09/22/13    esophagitis  . Asthma     no recent flare ups - no inhalers - as of 11/19/13  . GERD (gastroesophageal reflux disease)   . Anemia     iron deficiency   . Pneumonia     health care -associated July 2015 post op gastrectomy surgery  . 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    Past Surgical History  Procedure Laterality Date  . Cesarean section      1997  . Laparotomy N/A 10/10/2013    Procedure: SUBTOTAL GASTRECTOMY WITH GASTRIC JEJUNOSOTMY;  Surgeon: Edward Jolly, MD;  Location: [facility];  Service: General;  Laterality: N/A;  . Portacath placement Right 11/20/2013    Procedure: INSERTION PORT-A-CATH with ULTRA SOUND;  Surgeon: Adin Hector, MD;  Location: [facility];  Service: General;  Laterality: Right;    has Joint pain; Stomach pain; Gastric cancer; Gastric outlet obstruction; Anemia, iron deficiency; Protein-calorie malnutrition, severe; Hypomagnesemia; Hyperphosphatemia; Fever, unspecified; Tachycardia; Leucocytosis; HCAP (healthcare-associated pneumonia); Candida tropicalis infection; HAP (hospital-acquired pneumonia); Cancer of antrum of stomach; URI (upper respiratory infection); Hand foot syndrome; Neuropathy due to chemotherapeutic drug; Anorexia; Hyperbilirubinemia; Transaminitis; Neoplastic malignant related fatigue; Weight loss; Diarrhea; Hypoalbuminemia; and Nausea without vomiting on her problem list.    is allergic to compazine; azithromycin; sulfa antibiotics; hydrocodone-acetaminophen; and other.    Medication List  This list is accurate as of: 11/18/14  5:58 PM.  Always use your most recent med list.               ferrous sulfate 325 (65 FE) MG tablet  Take 325 mg by mouth 2 (two) times  daily with a meal.     hydroxychloroquine 200 MG tablet  Commonly known as:  PLAQUENIL  Take 1 tablet (200 mg total) by mouth 2 (two) times daily.     ibuprofen 200 MG tablet  Commonly known as:  ADVIL,MOTRIN  Take 200-600 mg by mouth every 6 (six) hours as needed for headache or moderate pain.     lidocaine-prilocaine cream  Commonly known as:  EMLA  Appy small amount of cream over port site 1-2 hours prior to treatment and cover with plastic wrap.  DO NOT RUB IN     predniSONE 5 MG tablet  Commonly known as:  DELTASONE  Take 5 mg by mouth daily with breakfast.         PHYSICAL EXAMINATION  Oncology Vitals 11/18/2014 11/13/2014 10/19/2014 09/07/2014 08/03/2014 07/09/2014 06/24/2014  Height - - 168 cm 168 cm 168 cm 168 cm -  Weight - 66.316 kg 68.992 kg 71.759 kg 73.029 kg 73.392 kg -  Weight (lbs) - 146 lbs 3 oz 152 lbs 2 oz 158 lbs 3 oz 161 lbs 161 lbs 13 oz -  BMI (kg/m2) - - 24.55 kg/m2 25.53 kg/m2 25.99 kg/m2 26.12 kg/m2 -  Temp 97.9 99.3 98.5 98.5 98.2 97.8 98.1  Pulse 103 105 94 95 78 91 80  Resp _0 SpO2 100 100 100 100 100 100 100  BSA (m2) - - 1.79 m2 1.83 m2 1.84 m2 1.85 m2 -   BP Readings from Last 3 Encounters:  11/18/14 109/60  11/13/14 106/55  10/19/14 104/73    Physical Exam  Constitutional: She is oriented to person, place, and time and well-developed, well-nourished, and in no distress.  HENT:  Head: Normocephalic and atraumatic.  Mouth/Throat: Oropharynx is clear and moist.  Eyes: Conjunctivae and EOM are normal. Pupils are equal, round, and reactive to light. Right eye exhibits no discharge. Left eye exhibits no discharge. No scleral icterus.  Neck: Normal range of motion. Neck supple.  Pulmonary/Chest: Effort normal. No respiratory distress.  Abdominal: Soft. She exhibits mass. She exhibits no distension. There is no tenderness. There is no rebound and no guarding.  Questionable palpable mass to central midline abdominal area.  No  tenderness to site.  Musculoskeletal: Normal range of motion. She exhibits no edema or tenderness.  Neurological: She is alert and oriented to person, place, and time. Gait normal.  Skin: Skin is warm and dry.  Psychiatric: Affect normal.  Nursing note and vitals reviewed.   LABORATORY DATA:. Appointment on 11/18/2014  Component Date Value Ref Range Status  . WBC 11/18/2014 4.7  3.9 - 10.3 10e3/uL Final  . NEUT# 11/18/2014 2.6  1.5 - 6.5 10e3/uL Final  . HGB 11/18/2014 11.1* 11.6 - 15.9 g/dL Final  . HCT 11/18/2014 33.5* 34.8 - 46.6 % Final  . Platelets 11/18/2014 205  145 - 400 10e3/uL Final  . MCV 11/18/2014 80.3  79.5 - 101.0 fL Final  . MCH 11/18/2014 26.6  25.1 - 34.0 pg Final  . MCHC 11/18/2014 33.1  31.5 - 36.0 g/dL Final  . RBC 11/18/2014 4.17  3.70 - 5.45 10e6/uL Final  . RDW 11/18/2014 19.5* 11.2 - 14.5 % Final  .  lymph# 11/18/2014 1.4  0.9 - 3.3 10e3/uL Final  . MONO# 11/18/2014 0.6  0.1 - 0.9 10e3/uL Final  . Eosinophils Absolute 11/18/2014 0.1  0.0 - 0.5 10e3/uL Final  . Basophils Absolute 11/18/2014 0.0  0.0 - 0.1 10e3/uL Final  . NEUT% 11/18/2014 54.9  38.4 - 76.8 % Final  . LYMPH% 11/18/2014 30.1  14.0 - 49.7 % Final  . MONO% 11/18/2014 13.5  0.0 - 14.0 % Final  . EOS% 11/18/2014 1.3  0.0 - 7.0 % Final  . BASO% 11/18/2014 0.2  0.0 - 2.0 % Final  . Sodium 11/18/2014 139  136 - 145 mEq/L Final  . Potassium 11/18/2014 4.4  3.5 - 5.1 mEq/L Final  . Chloride 11/18/2014 106  98 - 109 mEq/L Final  . CO2 11/18/2014 25  22 - 29 mEq/L Final  . Glucose 11/18/2014 111  70 - 140 mg/dl Final  . BUN 11/18/2014 4.9* 7.0 - 26.0 mg/dL Final  . Creatinine 11/18/2014 0.8  0.6 - 1.1 mg/dL Final  . Total Bilirubin 11/18/2014 2.73* 0.20 - 1.20 mg/dL Final  . Alkaline Phosphatase 11/18/2014 487* 40 - 150 U/L Final  . AST 11/18/2014 222* 5 - 34 U/L Final  . ALT 11/18/2014 167* 0 - 55 U/L Final  . Total Protein 11/18/2014 8.6* 6.4 - 8.3 g/dL Final  . Albumin 11/18/2014 2.7* 3.5 - 5.0  g/dL Final  . Calcium 11/18/2014 9.6  8.4 - 10.4 mg/dL Final  . Anion Gap 11/18/2014 8  3 - 11 mEq/L Final  . EGFR 11/18/2014 >90  >90 ml/min/1.73 m2 Final   eGFR is calculated using the CKD-EPI Creatinine Equation (2009)  . CEA 11/18/2014 2.7  0.0 - 5.0 ng/mL Final     RADIOGRAPHIC STUDIES: Ct Abdomen Pelvis W Contrast  11/18/2014   CLINICAL DATA:  Restaging gastric cancer diagnosed in June 2015. Chemotherapy completed 6 months ago. Slight cough and shortness of breath with activity. Subsequent encounter.  EXAM: CT ABDOMEN AND PELVIS WITH CONTRAST  TECHNIQUE: Multidetector CT imaging of the abdomen and pelvis was performed using the standard protocol following bolus administration of intravenous contrast.  CONTRAST:  120m OMNIPAQUE IOHEXOL 300 MG/ML  SOLN  COMPARISON:  [facility]  FINDINGS: Lower chest: Mild dependent atelectasis at both lung bases. No suspicious nodularity, pleural or pericardial effusion.  Hepatobiliary: Interval development of extensive intrahepatic and extrahepatic biliary dilatation. The intrahepatic left bile duct measures 13 mm on image 15. The common bile duct measures up to 11 mm in diameter and demonstrates an abrupt transition just above the pancreatic head adjacent to multiple surgical clips. There is mild atrophy within the left hepatic lobe. There is concern of a developing peripherally enhancing lesion within the caudate lobe, measuring 2.1 cm on image 21. No other hepatic lesions identified. The gallbladder is mildly distended without adjacent inflammatory change.  Pancreas: Interval development of pancreatic ductal dilatation within the body and tail, measuring up to 6 mm in diameter. There is relatively gradual transition in duct caliber tip normal within the pancreatic head. There is concern of an ill-defined mass involving the pancreatic head and uncinate process, with resulting  mass effect on the extrahepatic portal vein. 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 This lesion is not well-defined, measuring approximately 2.3 cm on image 23.  Spleen: Normal in size without focal abnormality.  Adrenals/Urinary Tract: Both adrenal glands appear normal. The kidneys  appear normal without evidence of urinary tract calculus, suspicious lesion or hydronephrosis. No bladder abnormalities are seen.  Stomach/Bowel: Stable postsurgical findings status post gastric bypass and gastrojejunostomy. No evidence of recurrent bowel wall lesion, wall thickening or significant dilatation.  Vascular/Lymphatic: The new masses within the porta hepatis may in part reflect confluent lymphadenopathy. There are mildly enlarged lymph nodes within the mesentery. There is no retroperitoneal lymphadenopathy. As above, the extrahepatic portal vein appears occluded with distal reconstitution within the liver. The splenic vein and SMV are patent. No significant arterial abnormalities identified.  Reproductive: Uterine heterogeneity is similar to the prior examination, likely secondary to the presence of fibroids. There is a new complex left adnexal lesion measuring 4.8 x 4.9 cm on image 65. This has solid and cystic components and could reflect a metastasis or complex cyst. There is no right adnexal mass.  Other: Minimal ascites. Enhancing soft tissue mass within the anterior abdominal wall deep to the incision has mildly enlarged, measuring 3.1 x 1.9 cm on image 34. Interval enlargement of multiple peritoneal nodules highly worrisome for peritoneal carcinomatosis. These include a 1.4 cm left mesenteric nodule on image 30 and a right omental nodule measuring 1.5 cm on image 52.  Musculoskeletal: No acute or significant osseous findings.  IMPRESSION: 1. Significant progression in metastatic disease with enlarging metastasis within the anterior abdominal wall, enlargement of multiple  peritoneal implants, an interval development of masses within the pancreatic head and caudate lobe of the liver. 2. 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 3. 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   Electronically Signed   By: Richardean Sale M.D.   On: 11/18/2014 08:42    ASSESSMENT/PLAN:    Gastric cancer Patient is status post subtotal gastrectomy and chemotherapy in the past.  Currently undergoing observation only.  Patient's last restaging scan here at the cancer center was obtained in March 2016.  She also had a restaging scan per [facility] in June 2016.  Patient reports  week long history of nausea, vomiting, diarrhea, increased fatigue, decreased appetite, and weight loss.   Pt states that her nausea has improved within past few days however. She denies any fever or chills.   Restaging CT obtained today revealed:   IMPRESSION: 1. Significant progression in metastatic disease with enlarging metastasis within the anterior abdominal wall, enlargement of multiple peritoneal implants, an interval development of masses within the pancreatic head and caudate lobe of the liver. 2. 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 3. 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  CT scans were reviewed in detail per Dr. Benay Spice with both patient and her mother.  Dr. Benay Spice consulted with Dr. Paulita Fujita gastroenterology; and the plan is for the patient to have a biliary stent and biliary drain placed first thing in the morning.  Patient was instructed to  remain nothing by mouth after midnight tonight for planned procedure.  Will reschedule patient for labs and a follow-up visit on 12/02/2014.        Hyperbilirubinemia Bilirubin has increased from 2.38 up to 2.73 today.  Patient is scheduled for a biliary stent and biliary drain procedure first thing in  the morning per Dr. Paulita Fujita.  Hyperphosphatemia Alkaline phosphatase has increased from 457 up to 487.  Most likely, this is secondary to bile duct obstruction.  Patient is scheduled for biliary stent in biliary drain procedure first thing in the morning.   Hypoalbuminemia Albumen continues low at 2.7.  Patient was encouraged to push protein in her diet is much as possible.  Nausea without vomiting Patient continues to complain of some mild, intermittent nausea; but no vomiting now.  Patient continues to take Zofran on an as-needed basis at home.  Hopefully, obtaining the biliary stent and biliary drain will resolve patient's intermittent nausea.  Neoplastic malignant related fatigue Patient continues to complain of some chronic fatigue.  OXY clear.  This is secondary to disease progression.  Patient was encouraged to push fluids and protein; and also to remain as active as possible.  Transaminitis Both AST and ALT remain elevated.  Most likely, secondary to bile duct obstruction.  Patient is scheduled for a biliary stent and biliary drain first thing in the morning.   Patient stated understanding of all instructions; and was in agreement with this plan of care. The patient knows to call the clinic with any problems, questions or concerns.   This was a shared visit with Dr. Benay Spice today.  Total time spent with patient was 40 minutes;  with greater than 75 percent of that time spent in face to face counseling regarding patient's symptoms,  and coordination of care and follow up.  Disclaimer: This note was dictated with voice recognition software. Similar sounding words can  inadvertently be transcribed and may not be corrected upon review.   Drue Second, NP 11/18/2014  This was a shared visit with Drue Second. Ms. Danh was interviewed and examined. She has clinical and CT evidence of progressive metastatic gastric cancer. There is a mass near the head of the pancreas causing bile duct and pancreatic duct obstruction. Her case was presented at the GI tumor conference earlier today. I discussed the case with Dr. Paulita Fujita. He is unable to perform an ERCP stent placement secondary to the post gastrectomy anatomy. She will be referred to interventional radiology for a percutaneous biliary drain.  I reviewed the CT images with Ms. Buehring and her mother. She has progressive carcinomatosis. We will consider systemic treatment options after the bile ducts are decompressed.  Julieanne Manson, M.D.

## 2014-11-18 NOTE — Assessment & Plan Note (Signed)
Albumen continues low at 2.7.  Patient was encouraged to push protein in her diet is much as possible.

## 2014-11-18 NOTE — Telephone Encounter (Signed)
Spoke to pt- she is aware of appt tomorrow at 630 at [place] stay for her Biliary drain. Pt verbalized an understanding NPO after midnight, she must have a driver and the procedure will not be able to be completed endoscopically.

## 2014-11-19 ENCOUNTER — Ambulatory Visit (HOSPITAL_COMMUNITY)
Admission: RE | Admit: 2014-11-19 | Discharge: 2014-11-19 | Disposition: A | Discharge status: Home / Self Care | Payer: 59 | Source: Ambulatory Visit | Attending: Oncology | Admitting: Oncology

## 2014-11-19 ENCOUNTER — Other Ambulatory Visit: Payer: Self-pay | Admitting: Oncology

## 2014-11-19 ENCOUNTER — Encounter (HOSPITAL_COMMUNITY): Payer: Self-pay

## 2014-11-19 ENCOUNTER — Telehealth: Payer: Self-pay | Admitting: Oncology

## 2014-11-19 DIAGNOSIS — R112 Nausea with vomiting, unspecified: Secondary | ICD-10-CM | POA: Diagnosis not present

## 2014-11-19 DIAGNOSIS — C169 Malignant neoplasm of stomach, unspecified: Secondary | ICD-10-CM

## 2014-11-19 DIAGNOSIS — Z903 Acquired absence of stomach [part of]: Secondary | ICD-10-CM | POA: Diagnosis not present

## 2014-11-19 DIAGNOSIS — K869 Disease of pancreas, unspecified: Secondary | ICD-10-CM | POA: Diagnosis not present

## 2014-11-19 DIAGNOSIS — M3500 Sicca syndrome, unspecified: Secondary | ICD-10-CM | POA: Insufficient documentation

## 2014-11-19 DIAGNOSIS — J45909 Unspecified asthma, uncomplicated: Secondary | ICD-10-CM | POA: Insufficient documentation

## 2014-11-19 DIAGNOSIS — M329 Systemic lupus erythematosus, unspecified: Secondary | ICD-10-CM | POA: Insufficient documentation

## 2014-11-19 DIAGNOSIS — Z8249 Family history of ischemic heart disease and other diseases of the circulatory system: Secondary | ICD-10-CM | POA: Diagnosis not present

## 2014-11-19 DIAGNOSIS — Z833 Family history of diabetes mellitus: Secondary | ICD-10-CM | POA: Insufficient documentation

## 2014-11-19 DIAGNOSIS — K831 Obstruction of bile duct: Secondary | ICD-10-CM | POA: Diagnosis not present

## 2014-11-19 DIAGNOSIS — K219 Gastro-esophageal reflux disease without esophagitis: Secondary | ICD-10-CM | POA: Diagnosis not present

## 2014-11-19 DIAGNOSIS — Z882 Allergy status to sulfonamides status: Secondary | ICD-10-CM | POA: Diagnosis not present

## 2014-11-19 LAB — PROTIME-INR
INR: 1.15 (ref 0.00–1.49)
Prothrombin Time: 14.8 seconds (ref 11.6–15.2)

## 2014-11-19 LAB — HCG, SERUM, QUALITATIVE: PREG SERUM: NEGATIVE

## 2014-11-19 MED ORDER — SODIUM CHLORIDE 0.9 % IV SOLN
8.0000 mg | Freq: Once | INTRAVENOUS | Status: AC
  Administered 2014-11-19: 8 mg via INTRAVENOUS

## 2014-11-19 MED ORDER — MIDAZOLAM HCL 2 MG/2ML IJ SOLN
INTRAMUSCULAR | Status: AC | PRN
  Administered 2014-11-19: 1 mg via INTRAVENOUS
  Administered 2014-11-19: 0.5 mg via INTRAVENOUS
  Administered 2014-11-19: 1 mg via INTRAVENOUS

## 2014-11-19 MED ORDER — SODIUM CHLORIDE 0.9 % IV SOLN
INTRAVENOUS | Status: AC | PRN
  Administered 2014-11-19: 10 mL/h via INTRAVENOUS

## 2014-11-19 MED ORDER — ONDANSETRON HCL 4 MG/2ML IJ SOLN
INTRAMUSCULAR | Status: AC
  Filled 2014-11-19: qty 4

## 2014-11-19 MED ORDER — ONDANSETRON HCL 4 MG/2ML IJ SOLN
INTRAMUSCULAR | Status: AC
  Administered 2014-11-19: 4 mg via INTRAVENOUS
  Filled 2014-11-19: qty 2

## 2014-11-19 MED ORDER — LIDOCAINE HCL 1 % IJ SOLN
INTRAMUSCULAR | Status: AC
  Filled 2014-11-19: qty 20

## 2014-11-19 MED ORDER — HYDROMORPHONE HCL 1 MG/ML IJ SOLN
0.5000 mg | INTRAMUSCULAR | Status: DC | PRN
  Administered 2014-11-19: 0.5 mg via INTRAVENOUS

## 2014-11-19 MED ORDER — HYDROMORPHONE HCL 1 MG/ML IJ SOLN
INTRAMUSCULAR | Status: AC
  Administered 2014-11-19: 0.5 mg via INTRAVENOUS
  Filled 2014-11-19: qty 1

## 2014-11-19 MED ORDER — SODIUM CHLORIDE 0.9 % IV SOLN: INTRAVENOUS | Status: DC

## 2014-11-19 MED ORDER — MIDAZOLAM HCL 2 MG/2ML IJ SOLN
INTRAMUSCULAR | Status: AC
  Filled 2014-11-19: qty 4

## 2014-11-19 MED ORDER — HYDROCODONE-ACETAMINOPHEN 5-325 MG PO TABS: 1.0000 | ORAL_TABLET | ORAL | Status: DC | PRN

## 2014-11-19 MED ORDER — IOHEXOL 300 MG/ML  SOLN
50.0000 mL | Freq: Once | INTRAMUSCULAR | Status: DC | PRN
  Administered 2014-11-19: 10 mL via INTRAVENOUS
  Filled 2014-11-19: qty 50

## 2014-11-19 MED ORDER — SODIUM CHLORIDE 0.9 % IV SOLN
INTRAVENOUS | Status: DC
  Administered 2014-11-19: 07:00:00 via INTRAVENOUS

## 2014-11-19 MED ORDER — FENTANYL CITRATE (PF) 100 MCG/2ML IJ SOLN
INTRAMUSCULAR | Status: AC
  Filled 2014-11-19: qty 4

## 2014-11-19 MED ORDER — ONDANSETRON HCL 4 MG/2ML IJ SOLN
4.0000 mg | Freq: Four times a day (QID) | INTRAMUSCULAR | Status: DC
  Administered 2014-11-19: 4 mg via INTRAVENOUS

## 2014-11-19 MED ORDER — MEPERIDINE HCL 50 MG/ML IJ SOLN
INTRAMUSCULAR | Status: AC
  Filled 2014-11-19: qty 1

## 2014-11-19 MED ORDER — PIPERACILLIN-TAZOBACTAM 3.375 G IVPB
3.3750 g | Freq: Once | INTRAVENOUS | Status: AC
  Administered 2014-11-19: 3.375 g via INTRAVENOUS
  Filled 2014-11-19: qty 50

## 2014-11-19 MED ORDER — FENTANYL CITRATE (PF) 100 MCG/2ML IJ SOLN
INTRAMUSCULAR | Status: AC | PRN
  Administered 2014-11-19: 25 ug via INTRAVENOUS
  Administered 2014-11-19: 50 ug via INTRAVENOUS
  Administered 2014-11-19: 25 ug via INTRAVENOUS

## 2014-11-19 MED ORDER — MEPERIDINE HCL 25 MG/ML IJ SOLN
INTRAMUSCULAR | Status: AC | PRN
Start: 2014-11-19 — End: 2014-11-19
  Administered 2014-11-19: 25 mg via INTRAVENOUS

## 2014-11-19 MED ORDER — KETOROLAC TROMETHAMINE 30 MG/ML IJ SOLN
30.0000 mg | Freq: Once | INTRAMUSCULAR | Status: AC
  Administered 2014-11-19: 30 mg via INTRAVENOUS
  Filled 2014-11-19: qty 1

## 2014-11-19 NOTE — Sedation Documentation (Signed)
Having pain 7/10 (see VS) but no signs of distress. Short Stay RN aware.

## 2014-11-19 NOTE — Progress Notes (Signed)
Patient ID: Andrea Best, female   DOB: 17-Jan-1971, 44 y.o.   MRN: [id number]   Pt with nausea No vomiting "dry heaves" upon time to DC home  Zofran 4 mg IV given Pt has Zofran po at home  Discussed with Dr Earleen Newport Percocet 5/325 mg #30 1-2 every 6-8 hrs as needed  Pt to call or come to ED if sxs worsen May dc home if stable in 30 mins

## 2014-11-19 NOTE — Telephone Encounter (Signed)
Left message to confirm appointment change from 09/06 to 09/07, Ct scan canceled per pof.

## 2014-11-19 NOTE — Procedures (Signed)
Interventional Radiology Procedure Note  Procedure: Image guided percutaneous transhepatic cholangiogram, with int/ext biliary drain across obstruction at the liver hilum/head of pancreas.  56F biliary drain into the duodenum across the obstruction.  Draining bile to gravity.   Complications: None Recommendations:  - Observe 4 hours, with pain control - Gravity Drain - Follow up in IV clinic for repeat CMP and capping in 1 week post op.  - Will discuss regarding possible stent placement at obstruction.   Signed,  Dulcy Fanny. Earleen Newport, DO

## 2014-11-19 NOTE — Discharge Instructions (Signed)
Biliary Drainage Catheter Placement, Care After Refer to this sheet in the next few weeks. These instructions provide you with information on caring for yourself after your procedure. Your health care provider may also give you more specific instructions. Your treatment has been planned according to current medical practices, but problems sometimes occur. Call your health care provider if you have any problems or questions after your procedure. WHAT TO EXPECT AFTER THE PROCEDURE After your procedure, it is typical to have the following:  Pain or soreness at the catheter insertion site.  Drowsiness for several hours after the procedure.  Some bruising at the catheter insertion site.  Drainage into the collection bag on the outside of your body, if you have an external drainage catheter. You might see bloody discharge in the bag for the first day or two. This should turn a yellow-green color soon afterward. HOME CARE INSTRUCTIONS   Do not use machinery, drive, or make legal decisions for 24 hours after your procedure.  Have someone drive you home.  Resume your usual diet. Avoid alcoholic beverages for 24 hours after your procedure.  Rest for the remainder of the day.  Only take over-the-counter or prescription medicines for pain, discomfort, or fever as directed by your health care provider. Do not take aspirin or blood thinners unless directed otherwise. This can make bleeding worse.  Clean the tube insertion site as directed by your health care provider.  Take showers, not baths. Avoid pools and hot tubs. Before showering, cover the area with plastic wrap and tape the edges of the plastic wrap to your skin. This is done to keep your skin dry.  Keep the skin around the insertion site dry. If the area gets wet, dry the skin completely.  Keep all follow-up appointments. SEEK MEDICAL CARE IF:   Your pain gets worse and is not relieved with pain medicines after an initial  improvement.  You have any questions about your tube.  Your skin breaks down around the tube.  You have a fever.  You have chills. SEEK IMMEDIATE MEDICAL CARE IF:   Your redness, soreness, or swelling at the tube insertion site gets worse despite good cleaning.  You have leakage of bile around the tube.  Your tube becomes blocked or clogged.  Your catheter is dislodged or comes out. Document Released: 10/26/2003 Document Revised: 03/18/2013 Document Reviewed: 11/18/2012 [facility] Patient Information 2015 [place], [place]. This information is not intended to replace advice given to you by your health care provider. Make sure you discuss any questions you have with your health care provider.

## 2014-11-19 NOTE — H&P (Signed)
Chief Complaint: Patient was seen in consultation today for N/V/D; biliary obstruction at the request of Sherrill,Gary B  Referring Physician(s): Sherrill,Gary B  History of Present Illness: Andrea Best is a 44 y.o. female   Pt with Hx gastric cancer 10/2013 Subtotal gastrectomy and chemotherapy Has had 1 week sxs of N/V/D; wt loss; fatigue; abd pain Hyperbilirubinemia Increased alkaline phosphatase; low albumin; increased liver functions Imaging 8/23: IMPRESSION: 1. Significant progression in metastatic disease with enlarging metastasis within the anterior abdominal wall, enlargement of multiple peritoneal implants, an interval development of masses within the pancreatic head and caudate lobe of the liver. 2. 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 3. 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  Request for biliary drain/possible stent placement  Now scheduled for same    Past Medical History  Diagnosis Date  . Eczema   . Lupus     joint pain and extreme fatique - improved after plaquenil and prednisone therapy  . Sjogren's disease     caused pt to loose her teeth due to dry mouth;  prone to eye irritation and infection due to dry eye; affects immune system - PRONE TO INFECTIONS   . Allergy   . Tachycardia   . Protein-calorie malnutrition, severe   . Abnormal findings on esophagogastroduodenoscopy (EGD) 09/22/13    esophagitis  . Asthma     no recent flare ups - no inhalers - as of 11/19/13  . GERD (gastroesophageal reflux disease)   . Anemia     iron deficiency   . Pneumonia     health care -associated July 2015 post op gastrectomy surgery  . 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    Past Surgical History  Procedure Laterality Date  . Cesarean section      1997  . Laparotomy N/A 10/10/2013    Procedure: SUBTOTAL GASTRECTOMY WITH GASTRIC JEJUNOSOTMY;  Surgeon: Edward Jolly, MD;  Location: [facility];  Service: General;  Laterality: N/A;  . Portacath placement Right 11/20/2013    Procedure: INSERTION PORT-A-CATH with ULTRA SOUND;  Surgeon: Adin Hector, MD;  Location: [facility];  Service: General;  Laterality: Right;    Allergies: Compazine; Azithromycin; Sulfa antibiotics; Hydrocodone-acetaminophen; and Other  Medications: Prior to Admission medications   Medication Sig Start Date End Date Taking? Authorizing Provider  ferrous sulfate 325 (65 FE) MG tablet Take 325 mg by mouth 2 (two) times daily with a meal.   Yes Historical Provider, MD  ibuprofen (ADVIL,MOTRIN) 200 MG tablet Take 200-600 mg by mouth every 6 (six) hours as needed for headache or moderate pain.   Yes Historical Provider, MD  hydroxychloroquine (PLAQUENIL) 200 MG tablet Take 1 tablet (200 mg total) by mouth 2 (two) times daily. 09/17/14   Ladell Pier, MD  lidocaine-prilocaine (EMLA) cream Appy small amount of cream over port site 1-2 hours prior to treatment and cover with plastic wrap.  DO NOT RUB IN Patient not taking: Reported on 11/13/2014 03/18/14   Owens Shark, NP  predniSONE (DELTASONE) 5 MG tablet Take 5 mg by mouth daily with breakfast.     Historical Provider, MD     Family History  Problem Relation Age of Onset  . Hypertension Mother   .  Diabetes Mother   . Diabetes Maternal Grandmother   . Diabetes Maternal Grandfather     Social History   Social History  . Marital Status: Single    Spouse Name: N/A  . Number of Children: N/A  . Years of Education: College   Occupational History  .     Social History Main Topics  . Smoking status: Never Smoker   . Smokeless tobacco: Never Used  . Alcohol Use: Yes     Comment: very occasional- none  since feb 2015 when pt got sick  . Drug Use: No  . Sexual Activity: Not Asked   Other Topics Concern  . None   Social History Narrative   Single. Lives with daughter age 7, Lonn [place], and patient's mother.    Work at [employer] as a Research scientist (physical sciences).   Owns her own car. Always wears seatbelt.      Review of Systems: A 12 point ROS discussed and pertinent positives are indicated in the HPI above.  All other systems are negative.  Review of Systems  Constitutional: Positive for appetite change, fatigue and unexpected weight change.  Respiratory: Negative for shortness of breath.   Cardiovascular: Negative for chest pain.  Gastrointestinal: Positive for nausea, vomiting, abdominal pain and diarrhea.  Neurological: Positive for weakness.  Psychiatric/Behavioral: Negative for behavioral problems and confusion.    Vital Signs: BP 114/51 mmHg  Pulse 99  Temp(Src) 98 F (36.7 C)  Resp 20  Ht 5\' 6"  (1.676 m)  Wt 146 lb (66.225 kg)  BMI 23.58 kg/m2  SpO2 100%  LMP 11/03/2014 (Approximate)  Physical Exam  Constitutional: She is oriented to person, place, and time.  Cardiovascular: Normal rate, regular rhythm and normal heart sounds.   Pulmonary/Chest: Effort normal and breath sounds normal. She has no wheezes.  Abdominal: Soft. Bowel sounds are normal. There is tenderness.  Mild epigastric pain  Musculoskeletal: Normal range of motion.  Neurological: She is alert and oriented to person, place, and time.  Skin: Skin is warm and dry.  Psychiatric: She has a normal mood and affect. Her behavior is normal. Judgment and thought content normal.  Nursing note and vitals reviewed.   Mallampati Score:  MD Evaluation Airway: WNL Heart: WNL Abdomen: WNL Chest/ Lungs: WNL ASA  Classification: 3 Mallampati/Airway Score: One  Imaging: Ct Abdomen Pelvis W Contrast  11/18/2014   CLINICAL DATA:  Restaging gastric cancer diagnosed in June 2015. Chemotherapy completed 6 months  ago. Slight cough and shortness of breath with activity. Subsequent encounter.  EXAM: CT ABDOMEN AND PELVIS WITH CONTRAST  TECHNIQUE: Multidetector CT imaging of the abdomen and pelvis was performed using the standard protocol following bolus administration of intravenous contrast.  CONTRAST:  159mL OMNIPAQUE IOHEXOL 300 MG/ML  SOLN  COMPARISON:  Outside CT abdomen pelvis 09/03/2014 from [facility]. Abdominal pelvic CT 06/15/2004.  FINDINGS: Lower chest: Mild dependent atelectasis at both lung bases. No suspicious nodularity, pleural or pericardial effusion.  Hepatobiliary: Interval development of extensive intrahepatic and extrahepatic biliary dilatation. The intrahepatic left bile duct measures 13 mm on image 15. The common bile duct measures up to 11 mm in diameter and demonstrates an abrupt transition just above the pancreatic head adjacent to multiple surgical clips. There is mild atrophy within the left hepatic lobe. There is concern of a developing peripherally enhancing lesion within the caudate lobe, measuring 2.1 cm on image 21. No other hepatic lesions identified. The gallbladder is mildly distended without adjacent inflammatory change.  Pancreas:  Interval development of pancreatic ductal dilatation within the body and tail, measuring up to 6 mm in diameter. There is relatively gradual transition in duct caliber tip normal within the pancreatic head. There is concern of an ill-defined mass involving the pancreatic head and uncinate process, with resulting mass effect on the extrahepatic portal vein. 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 This lesion is not well-defined, measuring approximately 2.3 cm on image 23.  Spleen: Normal in size without focal abnormality.  Adrenals/Urinary Tract: Both adrenal glands appear normal. The kidneys appear normal without evidence of urinary tract calculus, suspicious lesion or hydronephrosis. No bladder  abnormalities are seen.  Stomach/Bowel: Stable postsurgical findings status post gastric bypass and gastrojejunostomy. No evidence of recurrent bowel wall lesion, wall thickening or significant dilatation.  Vascular/Lymphatic: The new masses within the porta hepatis may in part reflect confluent lymphadenopathy. There are mildly enlarged lymph nodes within the mesentery. There is no retroperitoneal lymphadenopathy. As above, the extrahepatic portal vein appears occluded with distal reconstitution within the liver. The splenic vein and SMV are patent. No significant arterial abnormalities identified.  Reproductive: Uterine heterogeneity is similar to the prior examination, likely secondary to the presence of fibroids. There is a new complex left adnexal lesion measuring 4.8 x 4.9 cm on image 65. This has solid and cystic components and could reflect a metastasis or complex cyst. There is no right adnexal mass.  Other: Minimal ascites. Enhancing soft tissue mass within the anterior abdominal wall deep to the incision has mildly enlarged, measuring 3.1 x 1.9 cm on image 34. Interval enlargement of multiple peritoneal nodules highly worrisome for peritoneal carcinomatosis. These include a 1.4 cm left mesenteric nodule on image 30 and a right omental nodule measuring 1.5 cm on image 52.  Musculoskeletal: No acute or significant osseous findings.  IMPRESSION: 1. Significant progression in metastatic disease with enlarging metastasis within the anterior abdominal wall, enlargement of multiple peritoneal implants, an interval development of masses within the pancreatic head and caudate lobe of the liver. 2. 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 3. 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    Electronically Signed   By: Richardean Sale M.D.   On: 11/18/2014 08:42    Labs:  CBC:  Recent Labs  09/07/14 0910 10/19/14 1129 11/13/14 1448 11/18/14 0912  WBC 4.6 5.1 6.3 4.7  HGB 9.0* 9.9* 10.9* 11.1*  HCT 28.3* 30.9* 32.8* 33.5*  PLT 239 230 207 205    COAGS:  Recent Labs  06/24/14 1142 11/19/14 0714  INR 1.04 1.15  APTT 30  --     BMP:  Recent Labs  11/20/13 0740  04/28/14 1211 06/16/14 0819 11/13/14 1448 11/18/14 0913  NA 137  < > 138 138 137 139  K 4.1  < > 4.5 4.1 4.1 4.4  CL 101  --   --   --   --   --   CO2 22  < > 26 23 21* 25  GLUCOSE 94  < > 83 94 94 111  BUN 7  < > 6.0* 5.8* 7.4 4.9*  CALCIUM 9.3  < > 8.2* 8.4 9.3 9.6  CREATININE 0.52  < > 0.7 0.7 0.8 0.8  GFRNONAA >90  --   --   --   --   --  GFRAA >90  --   --   --   --   --   < > = values in this interval not displayed.  LIVER FUNCTION TESTS:  Recent Labs  04/28/14 1211 06/16/14 0819 11/13/14 1448 11/18/14 0913  BILITOT 0.54 0.56 2.38* 2.73*  AST 31 47* 214* 222*  ALT 26 29 172* 167*  ALKPHOS 64 62 457* 487*  PROT 8.1 7.9 8.6* 8.6*  ALBUMIN 3.2* 3.0* 2.8* 2.7*    TUMOR MARKERS:  Recent Labs  10/19/14 1129 11/18/14 0913  CEA 2.6 2.7    Assessment and Plan:  Hx gastric cancer New development abd wall mass; pancreatic mass Biliary obstruction Scheduled for biliary drain placement; possible stent placement Risks and Benefits discussed with the patient including, but not limited to bleeding, infection which may lead to sepsis or even death and damage to adjacent structures. All of the patient's questions were answered, patient is agreeable to proceed. Consent signed and in chart.  Thank you for this interesting consult.  I greatly enjoyed meeting TAMMY WICKLIFFE and look forward to participating in their care.  A copy of this report was sent to the requesting provider on this date.  Signed: Omarian Jaquith A 11/19/2014, 7:50 AM   I spent a total of  30 Minutes    in face to face in clinical consultation, greater than 50% of which was counseling/coordinating care for biliary drain placement

## 2014-11-20 ENCOUNTER — Other Ambulatory Visit: Payer: Self-pay | Admitting: Interventional Radiology

## 2014-11-20 ENCOUNTER — Other Ambulatory Visit: Payer: Self-pay | Admitting: *Deleted

## 2014-11-20 DIAGNOSIS — K831 Obstruction of bile duct: Secondary | ICD-10-CM

## 2014-11-20 DIAGNOSIS — C169 Malignant neoplasm of stomach, unspecified: Secondary | ICD-10-CM

## 2014-11-21 ENCOUNTER — Emergency Department (HOSPITAL_COMMUNITY): Payer: 59

## 2014-11-21 ENCOUNTER — Emergency Department (HOSPITAL_COMMUNITY)
Admission: EM | Admit: 2014-11-21 | Discharge: 2014-11-22 | Disposition: A | Discharge status: Home / Self Care | Payer: 59 | Attending: Emergency Medicine | Admitting: Emergency Medicine

## 2014-11-21 ENCOUNTER — Encounter (HOSPITAL_COMMUNITY): Payer: Self-pay | Admitting: *Deleted

## 2014-11-21 DIAGNOSIS — D649 Anemia, unspecified: Secondary | ICD-10-CM | POA: Insufficient documentation

## 2014-11-21 DIAGNOSIS — Z79899 Other long term (current) drug therapy: Secondary | ICD-10-CM | POA: Insufficient documentation

## 2014-11-21 DIAGNOSIS — M3500 Sicca syndrome, unspecified: Secondary | ICD-10-CM | POA: Insufficient documentation

## 2014-11-21 DIAGNOSIS — M329 Systemic lupus erythematosus, unspecified: Secondary | ICD-10-CM | POA: Diagnosis not present

## 2014-11-21 DIAGNOSIS — N390 Urinary tract infection, site not specified: Secondary | ICD-10-CM | POA: Diagnosis not present

## 2014-11-21 DIAGNOSIS — Z872 Personal history of diseases of the skin and subcutaneous tissue: Secondary | ICD-10-CM | POA: Insufficient documentation

## 2014-11-21 DIAGNOSIS — Z8701 Personal history of pneumonia (recurrent): Secondary | ICD-10-CM | POA: Insufficient documentation

## 2014-11-21 DIAGNOSIS — Z8639 Personal history of other endocrine, nutritional and metabolic disease: Secondary | ICD-10-CM | POA: Insufficient documentation

## 2014-11-21 DIAGNOSIS — Z85028 Personal history of other malignant neoplasm of stomach: Secondary | ICD-10-CM | POA: Insufficient documentation

## 2014-11-21 DIAGNOSIS — R509 Fever, unspecified: Secondary | ICD-10-CM | POA: Diagnosis not present

## 2014-11-21 DIAGNOSIS — E871 Hypo-osmolality and hyponatremia: Secondary | ICD-10-CM | POA: Insufficient documentation

## 2014-11-21 DIAGNOSIS — J45909 Unspecified asthma, uncomplicated: Secondary | ICD-10-CM | POA: Insufficient documentation

## 2014-11-21 DIAGNOSIS — Z8719 Personal history of other diseases of the digestive system: Secondary | ICD-10-CM | POA: Diagnosis not present

## 2014-11-21 LAB — COMPREHENSIVE METABOLIC PANEL
ALK PHOS: 301 U/L — AB (ref 38–126)
ALT: 93 U/L — AB (ref 14–54)
AST: 85 U/L — AB (ref 15–41)
Albumin: 2.9 g/dL — ABNORMAL LOW (ref 3.5–5.0)
Anion gap: 10 (ref 5–15)
BUN: 8 mg/dL (ref 6–20)
CALCIUM: 8.8 mg/dL — AB (ref 8.9–10.3)
CO2: 21 mmol/L — AB (ref 22–32)
CREATININE: 0.84 mg/dL (ref 0.44–1.00)
Chloride: 98 mmol/L — ABNORMAL LOW (ref 101–111)
GFR calc non Af Amer: >60 mL/min (ref >60)
GLUCOSE: 127 mg/dL — AB (ref 65–99)
Potassium: 3.7 mmol/L (ref 3.5–5.1)
SODIUM: 129 mmol/L — AB (ref 135–145)
Total Bilirubin: 1.8 mg/dL — ABNORMAL HIGH (ref 0.3–1.2)
Total Protein: 8.8 g/dL — ABNORMAL HIGH (ref 6.5–8.1)

## 2014-11-21 LAB — CBC WITH DIFFERENTIAL/PLATELET
Basophils Absolute: 0 10*3/uL (ref 0.0–0.1)
Basophils Relative: 0 % (ref 0–1)
EOS ABS: 0 10*3/uL (ref 0.0–0.7)
EOS PCT: 0 % (ref 0–5)
HCT: 33.2 % — ABNORMAL LOW (ref 36.0–46.0)
Hemoglobin: 10.7 g/dL — ABNORMAL LOW (ref 12.0–15.0)
LYMPHS ABS: 0.5 10*3/uL — AB (ref 0.7–4.0)
Lymphocytes Relative: 4 % — ABNORMAL LOW (ref 12–46)
MCH: 26.2 pg (ref 26.0–34.0)
MCHC: 32.2 g/dL (ref 30.0–36.0)
MCV: 81.4 fL (ref 78.0–100.0)
MONO ABS: 0.7 10*3/uL (ref 0.1–1.0)
MONOS PCT: 5 % (ref 3–12)
Neutro Abs: 11.6 10*3/uL — ABNORMAL HIGH (ref 1.7–7.7)
Neutrophils Relative %: 91 % — ABNORMAL HIGH (ref 43–77)
PLATELETS: 192 10*3/uL (ref 150–400)
RBC: 4.08 MIL/uL (ref 3.87–5.11)
RDW: 19.1 % — AB (ref 11.5–15.5)
WBC: 12.8 10*3/uL — AB (ref 4.0–10.5)

## 2014-11-21 LAB — LIPASE, BLOOD: Lipase: 129 U/L — ABNORMAL HIGH (ref 22–51)

## 2014-11-21 LAB — I-STAT CG4 LACTIC ACID, ED: LACTIC ACID, VENOUS: 2.74 mmol/L — AB (ref 0.5–2.0)

## 2014-11-21 NOTE — ED Provider Notes (Signed)
CSN: [id number]     Arrival date & time 11/21/14  2204 History  This chart was scribed for Linton Flemings, MD by Hansel Feinstein, ED Scribe. This patient was seen in room WA12/WA12 and the patient's care was started at 11:01 PM.     Chief Complaint  Patient presents with  . Fever  . ca patient    The history is provided by the patient. No language interpreter was used.    HPI Comments: Andrea Best is a 44 y.o. female with Hx of lupus, Sjogren's disease, asthma, GERD, anemia, pneumonia, gastric cancer who presents to the Emergency Department complaining of moderate fever onset 7 hours ago with associated chills, chest tightness, rhinorrhea. She also notes some unchanged pain at the site of a recent biliary drain placement 2 days ago. She denies cough, dysuria, frequency, urgency.   Past Medical History  Diagnosis Date  . Eczema   . Lupus     joint pain and extreme fatique - improved after plaquenil and prednisone therapy  . Sjogren's disease     caused pt to loose her teeth due to dry mouth;  prone to eye irritation and infection due to dry eye; affects immune system - PRONE TO INFECTIONS   . Allergy   . Tachycardia   . Protein-calorie malnutrition, severe   . Abnormal findings on esophagogastroduodenoscopy (EGD) 09/22/13    esophagitis  . Asthma     no recent flare ups - no inhalers - as of 11/19/13  . GERD (gastroesophageal reflux disease)   . Anemia     iron deficiency   . Pneumonia     health care -associated July 2015 post op gastrectomy surgery  . 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   Past Surgical History  Procedure Laterality Date  . Cesarean section      1997  . Laparotomy N/A 10/10/2013    Procedure: SUBTOTAL GASTRECTOMY WITH GASTRIC JEJUNOSOTMY;  Surgeon: Edward Jolly, MD;  Location: [facility];  Service: General;  Laterality: N/A;  . Portacath placement Right 11/20/2013     Procedure: INSERTION PORT-A-CATH with ULTRA SOUND;  Surgeon: Adin Hector, MD;  Location: [facility];  Service: General;  Laterality: Right;   Family History  Problem Relation Age of Onset  . Hypertension Mother   . Diabetes Mother   . Diabetes Maternal Grandmother   . Diabetes Maternal Grandfather    Social History  Substance Use Topics  . Smoking status: Never Smoker   . Smokeless tobacco: Never Used  . Alcohol Use: Yes     Comment: very occasional- none since feb 2015 when pt got sick   OB History    Gravida Para Term Preterm AB TAB SAB Ectopic Multiple Living   1 1  1             Obstetric Comments   27 weeks, NICU stay. C-section secondary to Pre-E     Review of Systems  Constitutional: Positive for fever and chills.  HENT: Positive for rhinorrhea.   Respiratory: Positive for chest tightness. Negative for cough.   Genitourinary: Negative for dysuria, urgency and frequency.  Musculoskeletal: Positive for arthralgias.  All other systems reviewed and are negative.  Allergies  Compazine; Azithromycin; Sulfa antibiotics; Hydrocodone-acetaminophen; and Other  Home Medications   Prior to Admission medications   Medication Sig Start Date End Date Taking? Authorizing Provider  ALOE VERA JUICE PO Take 30 mLs  by mouth daily.   Yes Historical Provider, MD  CHLOROPHYLL-ALFALFA PO Take 5 mLs by mouth daily.   Yes Historical Provider, MD  ferrous sulfate 325 (65 FE) MG tablet Take 325 mg by mouth 2 (two) times daily with a meal.   Yes Historical Provider, MD  hydroxychloroquine (PLAQUENIL) 200 MG tablet Take 1 tablet (200 mg total) by mouth 2 (two) times daily. 09/17/14  Yes Ladell Pier, MD  ibuprofen (ADVIL,MOTRIN) 200 MG tablet Take 200-600 mg by mouth every 6 (six) hours as needed for headache or moderate pain.   Yes Historical Provider, MD  lidocaine-prilocaine (EMLA) cream Appy small amount of cream over port site 1-2 hours prior to treatment and cover with plastic wrap.  DO  NOT RUB IN Patient taking differently: Apply 1 application topically daily as needed. Appy small amount of cream over port site 1-2 hours prior to treatment and cover with plastic wrap.  DO NOT RUB IN 03/18/14  Yes Owens Shark, NP  ondansetron (ZOFRAN) 4 MG tablet Take 4 mg by mouth every 8 (eight) hours as needed for nausea or vomiting.   Yes Historical Provider, MD  OVER THE COUNTER MEDICATION Take 30 mLs by mouth daily. [place] go tea supplyment   Yes Historical Provider, MD  oxyCODONE-acetaminophen (PERCOCET/ROXICET) 5-325 MG per tablet Take 1 tablet by mouth every 4 (four) hours as needed for severe pain.   Yes Historical Provider, MD   BP 102/53 mmHg  Pulse 140  Temp(Src) 101 F (38.3 C) (Oral)  Resp 22  SpO2 97%  LMP 11/03/2014 (Approximate) Physical Exam  Constitutional: She is oriented to person, place, and time. She appears well-developed and well-nourished. No distress.  HENT:  Head: Normocephalic and atraumatic.  Right Ear: External ear normal.  Left Ear: External ear normal.  Nose: Nose normal.  Mouth/Throat: Oropharynx is clear and moist.  Eyes: Conjunctivae and EOM are normal. Pupils are equal, round, and reactive to light.  Neck: Normal range of motion. Neck supple. No JVD present. No tracheal deviation present. No thyromegaly present.  Cardiovascular: Normal rate, regular rhythm, normal heart sounds and intact distal pulses.  Exam reveals no gallop and no friction rub.   No murmur heard. Pulmonary/Chest: Effort normal and breath sounds normal. No stridor. No respiratory distress. She has no wheezes. She has no rales. She exhibits no tenderness.  Abdominal: Soft. Bowel sounds are normal. She exhibits no distension and no mass. There is no tenderness. There is no rebound and no guarding.  Bile drain site examined, no signs of erythema, drainage.  Abd soft, mildly diffusely tender  Musculoskeletal: Normal range of motion. She exhibits no edema or tenderness.   Lymphadenopathy:    She has no cervical adenopathy.  Neurological: She is alert and oriented to person, place, and time. She displays normal reflexes. She exhibits normal muscle tone. Coordination normal.  Skin: Skin is warm and dry. No rash noted. She is not diaphoretic. No erythema. No pallor.  Psychiatric: She has a normal mood and affect. Her behavior is normal. Judgment and thought content normal.  Nursing note and vitals reviewed.   ED Course  Procedures (including critical care time) DIAGNOSTIC STUDIES: Oxygen Saturation is 97% on RA, normal by my interpretation.    COORDINATION OF CARE: 11:06 PM Discussed treatment plan with pt at bedside and pt agreed to plan.   Labs Review Labs Reviewed  URINALYSIS, ROUTINE W REFLEX MICROSCOPIC (NOT AT [facility]) - Abnormal; Notable for the following:    Color,  Urine AMBER (*)    APPearance CLOUDY (*)    Leukocytes, UA MODERATE (*)    All other components within normal limits  COMPREHENSIVE METABOLIC PANEL - Abnormal; Notable for the following:    Sodium 129 (*)    Chloride 98 (*)    CO2 21 (*)    Glucose, Bld 127 (*)    Calcium 8.8 (*)    Total Protein 8.8 (*)    Albumin 2.9 (*)    AST 85 (*)    ALT 93 (*)    Alkaline Phosphatase 301 (*)    Total Bilirubin 1.8 (*)    All other components within normal limits  LIPASE, BLOOD - Abnormal; Notable for the following:    Lipase 129 (*)    All other components within normal limits  CBC WITH DIFFERENTIAL/PLATELET - Abnormal; Notable for the following:    WBC 12.8 (*)    Hemoglobin 10.7 (*)    HCT 33.2 (*)    RDW 19.1 (*)    Neutrophils Relative % 91 (*)    Neutro Abs 11.6 (*)    Lymphocytes Relative 4 (*)    Lymphs Abs 0.5 (*)    All other components within normal limits  URINE MICROSCOPIC-ADD ON - Abnormal; Notable for the following:    Squamous Epithelial / LPF MANY (*)    Bacteria, UA FEW (*)    All other components within normal limits  I-STAT CG4 LACTIC ACID, ED - Abnormal;  Notable for the following:    Lactic Acid, Venous 2.74 (*)    All other components within normal limits  CULTURE, BLOOD (ROUTINE X 2)  CULTURE, BLOOD (ROUTINE X 2)  URINE CULTURE    Imaging Review Ct Abdomen Pelvis W Contrast  11/22/2014   CLINICAL DATA:  Fever and elevated white count. History of gastric cancer. Pain at the site of biliary drain placed 2 days ago.  EXAM: CT ABDOMEN AND PELVIS WITH CONTRAST  TECHNIQUE: Multidetector CT imaging of the abdomen and pelvis was performed using the standard protocol following bolus administration of intravenous contrast.  CONTRAST:  28mL OMNIPAQUE IOHEXOL 300 MG/ML SOLN, 118mL OMNIPAQUE IOHEXOL 300 MG/ML SOLN  COMPARISON:  11/17/2014  FINDINGS: Focal areas of atelectasis or infiltration in the right lung base and anterior left lung base. Residual contrast material in the esophagus may indicate reflux or dysmotility. No dilatation.  Postoperative changes with partial gastrectomy. Metastatic disease again demonstrated in the abdomen with metastatic lesions demonstrated in the anterior abdominal wall, omentum, peritoneum, celiac axis, pancreatic head, and caudate lobe of liver. Metastatic disease appears similar to prior study. There is interval placement of a percutaneous biliary drainage catheter with tip in the duodenum. Mild intrahepatic bile duct dilatation which is decreased since the previous study. Persistent pancreatic ductal dilatation. The remaining stomach, small bowel, and colon are not abnormally distended. No free air or free fluid is demonstrated in the abdomen. No loculated fluid collections. The spleen, adrenal glands, kidneys, abdominal aorta, and inferior vena cava are unremarkable. Narrowing of the confluence of the portal vein and mesenteric vein likely due to extrinsic compression from the adjacent tumor. Portal veins otherwise appear patent.  Pelvis: Cold retrocecal appendix appears normal. Small amount of free fluid in the pelvis may be  reactive or neoplastic. No significant change since previous study. No loculated fluid collections are demonstrated. Complex cystic mass in the left ovary may represent metastatic lesion. No change since prior study. Focal enhancing nodule in the uterus is probably a  fibroid. No bladder wall thickening. No destructive bone lesions are appreciated.  IMPRESSION: Interval placement of a percutaneous biliary drainage catheter with some decompression of the intrahepatic bile ducts. Otherwise, no significant change since prior study. Diffuse metastatic disease again demonstrated throughout the abdomen. Possible cystic metastasis in the left ovary. Probable uterine fibroids. Small amount of free fluid in the pelvis is nonspecific. No loculated fluid collections to suggest any abscess.   Electronically Signed   By: Lucienne Capers M.D.   On: 11/22/2014 01:57   Dg Chest Port 1 View  11/22/2014   CLINICAL DATA:  Fever, diaphoresis, and fatigue since 4 p.m. Biliary drain was placed on Thursday. History of gastric cancer.  EXAM: PORTABLE CHEST - 1 VIEW  COMPARISON:  CT chest 09/03/2014.  Chest 11/20/2013.  FINDINGS: Are port type central venous catheter with tip over the cavoatrial junction. No pneumothorax. Shallow inspiration. Heart size and pulmonary vascularity are normal. Slight linear changes in the lung bases likely representing fibrosis or atelectasis. No focal consolidation or airspace disease. No blunting of costophrenic angles. No pneumothorax. Drainage catheter demonstrated in the right upper quadrant  IMPRESSION: Shallow inspiration with linear fibrosis or atelectasis in the lung bases. No focal consolidation.   Electronically Signed   By: Lucienne Capers M.D.   On: 11/22/2014 00:18   I have personally reviewed and evaluated these images and lab results as part of my medical decision-making.   EKG Interpretation None      MDM   Final diagnoses:  Fever, unspecified fever cause  Hyponatremia  UTI  (lower urinary tract infection)   I personally performed the services described in this documentation, which was scribed in my presence. The recorded information has been reviewed and is accurate.   44 yo female 3 days s/p biliary drain placement due to obstruction from cancer presents with fever.  No cough, no uti sxs.  Pain in abd unchanged from drain placement.  New murmur noted on exam today.  Plan for labs, blood and urine cultures, chest xray, and possible ct abd/pelvis to evaluate for fevers  12:11 AM Pt's labs reviewed, hyponatremia noted, most likely due to placement of biliary drain.  Elevated wbc as well.  Plan for CT abd/pelvis to evaluate for bile leak, abscess.  No signs of bile leak/abscess.  Fever has resolved without intervention.  Pt anxious to go home.  Will treat for UTI given fever.  Pt instructed to f/u in 1-2 days for recheck of sodium, return precautions given.  Linton Flemings, MD 11/22/14 [phone]

## 2014-11-21 NOTE — ED Notes (Addendum)
Pt complains of fever, diaphoresis, fatigue  since 4PM. Pt states her temp was 102 at home. Pt had a biliary drain placed Thursday and is concerned she she has an infection. Pt has hx of stomach cancer.

## 2014-11-22 ENCOUNTER — Emergency Department (HOSPITAL_COMMUNITY): Payer: 59

## 2014-11-22 LAB — URINALYSIS, ROUTINE W REFLEX MICROSCOPIC
BILIRUBIN URINE: NEGATIVE
Glucose, UA: NEGATIVE mg/dL
Hgb urine dipstick: NEGATIVE
KETONES UR: NEGATIVE mg/dL
NITRITE: NEGATIVE
PROTEIN: NEGATIVE mg/dL
Specific Gravity, Urine: 1.013 (ref 1.005–1.030)
UROBILINOGEN UA: 0.2 mg/dL (ref 0.0–1.0)
pH: 6.5 (ref 5.0–8.0)

## 2014-11-22 LAB — URINE MICROSCOPIC-ADD ON

## 2014-11-22 MED ORDER — CEPHALEXIN 500 MG PO CAPS
500.0000 mg | ORAL_CAPSULE | Freq: Once | ORAL | Status: AC
  Administered 2014-11-22: 500 mg via ORAL
  Filled 2014-11-22: qty 1

## 2014-11-22 MED ORDER — IOHEXOL 300 MG/ML  SOLN
100.0000 mL | Freq: Once | INTRAMUSCULAR | Status: AC | PRN
  Administered 2014-11-22: 100 mL via INTRAVENOUS

## 2014-11-22 MED ORDER — IOHEXOL 300 MG/ML  SOLN
50.0000 mL | Freq: Once | INTRAMUSCULAR | Status: AC | PRN
  Administered 2014-11-22: 50 mL via ORAL

## 2014-11-22 MED ORDER — CEPHALEXIN 500 MG PO CAPS: 500.0000 mg | ORAL_CAPSULE | Freq: Two times a day (BID) | ORAL | Status: DC

## 2014-11-22 NOTE — Discharge Instructions (Signed)
Your workup today has not shown a specific cause for your fever.  Your urinalysis showed bacteria and white blood cells, and you have been started on antibiotics in case this is the source of your fever.  Your sodium was noted to be low.  This is most likely due to your biliary drain.  Is important for you to increase your salt intake.  Contact your oncologist for recheck of your sodium within 1-2 days.  Return to the emergency department for signs of confusion, increased lethargy, or other concerning symptoms.   Fever, Adult A fever is a higher than normal body temperature. In an adult, an oral temperature around 98.6 F (37 C) is considered normal. A temperature of 100.4 F (38 C) or higher is generally considered a fever. Mild or moderate fevers generally have no long-term effects and often do not require treatment. Extreme fever (greater than or equal to 106 F or 41.1 C) can cause seizures. The sweating that may occur with repeated or prolonged fever may cause dehydration. Elderly people can develop confusion during a fever. A measured temperature can vary with:  Age.  Time of day.  Method of measurement (mouth, underarm, rectal, or ear). The fever is confirmed by taking a temperature with a thermometer. Temperatures can be taken different ways. Some methods are accurate and some are not.  An oral temperature is used most commonly. Electronic thermometers are fast and accurate.  An ear temperature will only be accurate if the thermometer is positioned as recommended by the manufacturer.  A rectal temperature is accurate and done for those adults who have a condition where an oral temperature cannot be taken.  An underarm (axillary) temperature is not accurate and not recommended. Fever is a symptom, not a disease.  CAUSES   Infections commonly cause fever.  Some noninfectious causes for fever include:  Some arthritis conditions.  Some thyroid or adrenal gland conditions.  Some  immune system conditions.  Some types of cancer.  A medicine reaction.  High doses of certain street drugs such as methamphetamine.  Dehydration.  Exposure to high outside or room temperatures.  Occasionally, the source of a fever cannot be determined. This is sometimes called a "fever of unknown origin" (FUO).  Some situations may lead to a temporary rise in body temperature that may go away on its own. Examples are:  Childbirth.  Surgery.  Intense exercise. HOME CARE INSTRUCTIONS   Take appropriate medicines for fever. Follow dosing instructions carefully. If you use acetaminophen to reduce the fever, be careful to avoid taking other medicines that also contain acetaminophen. Do not take aspirin for a fever if you are younger than age 90. There is an association with Reye's syndrome. Reye's syndrome is a rare but potentially deadly disease.  If an infection is present and antibiotics have been prescribed, take them as directed. Finish them even if you start to feel better.  Rest as needed.  Maintain an adequate fluid intake. To prevent dehydration during an illness with prolonged or recurrent fever, you may need to drink extra fluid.Drink enough fluids to keep your urine clear or pale yellow.  Sponging or bathing with room temperature water may help reduce body temperature. Do not use ice water or alcohol sponge baths.  Dress comfortably, but do not over-bundle. SEEK MEDICAL CARE IF:   You are unable to keep fluids down.  You develop vomiting or diarrhea.  You are not feeling at least partly better after 3 days.  You develop  new symptoms or problems. SEEK IMMEDIATE MEDICAL CARE IF:   You have shortness of breath or trouble breathing.  You develop excessive weakness.  You are dizzy or you faint.  You are extremely thirsty or you are making little or no urine.  You develop new pain that was not there before (such as in the head, neck, chest, back, or  abdomen).  You have persistent vomiting and diarrhea for more than 1 to 2 days.  You develop a stiff neck or your eyes become sensitive to light.  You develop a skin rash.  You have a fever or persistent symptoms for more than 2 to 3 days.  You have a fever and your symptoms suddenly get worse. MAKE SURE YOU:   Understand these instructions.  Will watch your condition.  Will get help right away if you are not doing well or get worse. Document Released: 09/06/2000 Document Revised: 07/28/2013 Document Reviewed: 01/12/2011 [facility] Patient Information 2015 [place], [place]. This information is not intended to replace advice given to you by your health care provider. Make sure you discuss any questions you have with your health care provider.  Hyponatremia  Hyponatremia is when the amount of salt (sodium) in your blood is too low. When sodium levels are low, your cells will absorb extra water and swell. The swelling happens throughout the body, but it mostly affects the brain. Severe brain swelling (cerebral edema), seizures, or coma can happen.  CAUSES   Heart, kidney, or liver problems.  Thyroid problems.  Adrenal gland problems.  Severe vomiting and diarrhea.  Certain medicines or illegal drugs.  Dehydration.  Drinking too much water.  Low-sodium diet. SYMPTOMS   Nausea and vomiting.  Confusion.  Lethargy.  Agitation.  Headache.  Twitching or shaking (seizures).  Unconsciousness.  Appetite loss.  Muscle weakness and cramping. DIAGNOSIS  Hyponatremia is identified by a simple blood test. Your caregiver will perform a history and physical exam to try to find the cause and type of hyponatremia. Other tests may be needed to measure the amount of sodium in your blood and urine. TREATMENT  Treatment will depend on the cause.   Fluids may be given through the vein (IV).  Medicines may be used to correct the sodium imbalance. If medicines are causing the  problem, they will need to be adjusted.  Water or fluid intake may be restricted to restore proper balance. The speed of correcting the sodium problem is very important. If the problem is corrected too fast, nerve damage (sometimes unchangeable) can happen. HOME CARE INSTRUCTIONS   Only take medicines as directed by your caregiver. Many medicines can make hyponatremia worse. Discuss all your medicines with your caregiver.  Carefully follow any recommended diet, including any fluid restrictions.  You may be asked to repeat lab tests. Follow these directions.  Avoid alcohol and recreational drugs. SEEK MEDICAL CARE IF:   You develop worsening nausea, fatigue, headache, confusion, or weakness.  Your original hyponatremia symptoms return.  You have problems following the recommended diet. SEEK IMMEDIATE MEDICAL CARE IF:   You have a seizure.  You faint.  You have ongoing diarrhea or vomiting. MAKE SURE YOU:   Understand these instructions.  Will watch your condition.  Will get help right away if you are not doing well or get worse. Document Released: 03/03/2002 Document Revised: 06/05/2011 Document Reviewed: 08/28/2010 [facility] Patient Information 2015 [place], [place]. This information is not intended to replace advice given to you by your health care provider. Make sure  you discuss any questions you have with your health care provider.  Urinary Tract Infection Urinary tract infections (UTIs) can develop anywhere along your urinary tract. Your urinary tract is your body's drainage system for removing wastes and extra water. Your urinary tract includes two kidneys, two ureters, a bladder, and a urethra. Your kidneys are a pair of bean-shaped organs. Each kidney is about the size of your fist. They are located below your ribs, one on each side of your spine. CAUSES Infections are caused by microbes, which are microscopic organisms, including fungi, viruses, and bacteria. These  organisms are so small that they can only be seen through a microscope. Bacteria are the microbes that most commonly cause UTIs. SYMPTOMS  Symptoms of UTIs may vary by age and gender of the patient and by the location of the infection. Symptoms in young women typically include a frequent and intense urge to urinate and a painful, burning feeling in the bladder or urethra during urination. Older women and men are more likely to be tired, shaky, and weak and have muscle aches and abdominal pain. A fever may mean the infection is in your kidneys. Other symptoms of a kidney infection include pain in your back or sides below the ribs, nausea, and vomiting. DIAGNOSIS To diagnose a UTI, your caregiver will ask you about your symptoms. Your caregiver also will ask to provide a urine sample. The urine sample will be tested for bacteria and white blood cells. White blood cells are made by your body to help fight infection. TREATMENT  Typically, UTIs can be treated with medication. Because most UTIs are caused by a bacterial infection, they usually can be treated with the use of antibiotics. The choice of antibiotic and length of treatment depend on your symptoms and the type of bacteria causing your infection. HOME CARE INSTRUCTIONS  If you were prescribed antibiotics, take them exactly as your caregiver instructs you. Finish the medication even if you feel better after you have only taken some of the medication.  Drink enough water and fluids to keep your urine clear or pale yellow.  Avoid caffeine, tea, and carbonated beverages. They tend to irritate your bladder.  Empty your bladder often. Avoid holding urine for long periods of time.  Empty your bladder before and after sexual intercourse.  After a bowel movement, women should cleanse from front to back. Use each tissue only once. SEEK MEDICAL CARE IF:   You have back pain.  You develop a fever.  Your symptoms do not begin to resolve within 3  days. SEEK IMMEDIATE MEDICAL CARE IF:   You have severe back pain or lower abdominal pain.  You develop chills.  You have nausea or vomiting.  You have continued burning or discomfort with urination. MAKE SURE YOU:   Understand these instructions.  Will watch your condition.  Will get help right away if you are not doing well or get worse. Document Released: 12/21/2004 Document Revised: 09/12/2011 Document Reviewed: 04/21/2011 [facility] Patient Information 2015 [place], [place]. This information is not intended to replace advice given to you by your health care provider. Make sure you discuss any questions you have with your health care provider.

## 2014-11-23 ENCOUNTER — Other Ambulatory Visit: Payer: Self-pay | Admitting: *Deleted

## 2014-11-23 ENCOUNTER — Telehealth: Payer: Self-pay

## 2014-11-23 DIAGNOSIS — C169 Malignant neoplasm of stomach, unspecified: Secondary | ICD-10-CM

## 2014-11-23 LAB — URINE CULTURE

## 2014-11-23 NOTE — Telephone Encounter (Signed)
Pt called stating she was in ED on 8/27. She was told to f/u in 1-2 days for her low Na+. Her Na+ was 129.

## 2014-11-23 NOTE — Telephone Encounter (Signed)
Discussed with Dr. Benay Spice: Check CMET this week. Called pt, she will come in afternoon of 8/30 for lab. Order sent to scheduler.

## 2014-11-24 ENCOUNTER — Ambulatory Visit (HOSPITAL_BASED_OUTPATIENT_CLINIC_OR_DEPARTMENT_OTHER): Payer: 59

## 2014-11-24 ENCOUNTER — Ambulatory Visit: Payer: 59

## 2014-11-24 ENCOUNTER — Telehealth: Payer: Self-pay | Admitting: Nurse Practitioner

## 2014-11-24 ENCOUNTER — Telehealth (HOSPITAL_COMMUNITY): Payer: Self-pay | Admitting: *Deleted

## 2014-11-24 DIAGNOSIS — C169 Malignant neoplasm of stomach, unspecified: Secondary | ICD-10-CM

## 2014-11-24 LAB — COMPREHENSIVE METABOLIC PANEL (CC13)
ALT: 53 U/L (ref 0–55)
ANION GAP: 9 meq/L (ref 3–11)
AST: 31 U/L (ref 5–34)
Albumin: 2.5 g/dL — ABNORMAL LOW (ref 3.5–5.0)
Alkaline Phosphatase: 260 U/L — ABNORMAL HIGH (ref 40–150)
BUN: 7.6 mg/dL (ref 7.0–26.0)
CALCIUM: 8.9 mg/dL (ref 8.4–10.4)
CHLORIDE: 104 meq/L (ref 98–109)
CO2: 23 meq/L (ref 22–29)
Creatinine: 0.7 mg/dL (ref 0.6–1.1)
EGFR: >90 mL/min/{1.73_m2} (ref >90)
Glucose: 122 mg/dl (ref 70–140)
POTASSIUM: 3.6 meq/L (ref 3.5–5.1)
Sodium: 135 mEq/L — ABNORMAL LOW (ref 136–145)
Total Bilirubin: 1.21 mg/dL — ABNORMAL HIGH (ref 0.20–1.20)
Total Protein: 8.5 g/dL — ABNORMAL HIGH (ref 6.4–8.3)

## 2014-11-24 NOTE — Telephone Encounter (Signed)
Patient called requesting a way to contact MD who performed her procedure on 8/25 and she stated it was Jannifer Franklin with radiology who prescribed the pain medication. I advised patient that I did not have a number for her I could give out. I suggested she contact radiology in the morning, her primary care MD, or the cancer center where she is currently a patient for assistance. Patient verbalized understanding.

## 2014-11-24 NOTE — Telephone Encounter (Signed)
Called and left a message with todays lab appt  anne

## 2014-11-25 ENCOUNTER — Telehealth: Payer: Self-pay | Admitting: *Deleted

## 2014-11-25 MED ORDER — OXYCODONE-ACETAMINOPHEN 5-325 MG PO TABS: 1.0000 | ORAL_TABLET | ORAL | Status: DC | PRN

## 2014-11-25 NOTE — Telephone Encounter (Signed)
Message from pt requesting refill on Oxycodone. Returned call, she denies any fever or chills. Reports abdominal and incisional site pain. Rx will be left in prescription book for pick up.

## 2014-11-26 ENCOUNTER — Ambulatory Visit
Admission: RE | Admit: 2014-11-26 | Discharge: 2014-11-26 | Disposition: A | Discharge status: Home / Self Care | Payer: 59 | Source: Ambulatory Visit | Attending: Interventional Radiology | Admitting: Interventional Radiology

## 2014-11-26 DIAGNOSIS — K831 Obstruction of bile duct: Secondary | ICD-10-CM

## 2014-11-26 NOTE — Progress Notes (Addendum)
Referring Physician(s): Dr Darrold Junker  Chief Complaint: The patient is seen in follow up today s/p Internal/ External biliary drain placement 11/19/14  History of present illness: 44 year old female with a history of gastric cancer and prior resection performed 10/10/2013. At this time of the surgical resection, the patient received subtotal gastrectomy with a porta hepatis and peripancreatic lymph node dissection. A Roux en Y gastrojejunostomy was performed.  She is now with a mass near the head of the pancreas, with presumed recurrence/progression, and resulting obstruction of the biliary system  Pt reports presenting to ED 2 days after drain placement with abdominal pain and chills/fever Taking Percocet 1 po every 4 hrs per Dr Benay Spice Tmax 102 in ED 8/27 Pt was evaluated and placed on Keflex 500 mg BID (she admits only taking antibiotic once a day normally). She does state she feels better; less pain; denies N/V Bowel movement have improved in frequency; consistency and more brown in color.  Daily output of drain over 1 liter: bilious color Not eating well; no appetite. Pushing fluids: water; gatorade and power ade Urinating well Denies pruritis  Pt has been seen and examined by Dr Laurence Ferrari Injection reveals good output drainage but very little internal drainage  Past Medical History  Diagnosis Date  . Eczema   . Lupus     joint pain and extreme fatique - improved after plaquenil and prednisone therapy  . Sjogren's disease     caused pt to loose her teeth due to dry mouth;  prone to eye irritation and infection due to dry eye; affects immune system - PRONE TO INFECTIONS   . Allergy   . Tachycardia   . Protein-calorie malnutrition, severe   . Abnormal findings on esophagogastroduodenoscopy (EGD) 09/22/13    esophagitis  . Asthma     no recent flare ups - no inhalers - as of 11/19/13  . GERD (gastroesophageal reflux disease)   . Anemia     iron deficiency   .  Pneumonia     health care -associated July 2015 post op gastrectomy surgery  . 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    Past Surgical History  Procedure Laterality Date  . Cesarean section      1997  . Laparotomy N/A 10/10/2013    Procedure: SUBTOTAL GASTRECTOMY WITH GASTRIC JEJUNOSOTMY;  Surgeon: Edward Jolly, MD;  Location: [facility];  Service: General;  Laterality: N/A;  . Portacath placement Right 11/20/2013    Procedure: INSERTION PORT-A-CATH with ULTRA SOUND;  Surgeon: Adin Hector, MD;  Location: [facility];  Service: General;  Laterality: Right;    Allergies: Compazine; Azithromycin; Sulfa antibiotics; Hydrocodone-acetaminophen; and Other  Medications: Prior to Admission medications   Medication Sig Start Date End Date Taking? Authorizing Provider  ALOE VERA JUICE PO Take 30 mLs by mouth daily.    Historical Provider, MD  cephALEXin (KEFLEX) 500 MG capsule Take 1 capsule (500 mg total) by mouth 2 (two) times daily. 11/22/14   Linton Flemings, MD  CHLOROPHYLL-ALFALFA PO Take 5 mLs by mouth daily.    Historical Provider, MD  ferrous sulfate 325 (65 FE) MG tablet Take 325 mg by mouth 2 (two) times daily with a meal.    Historical Provider, MD  hydroxychloroquine (PLAQUENIL) 200 MG tablet Take 1 tablet (200 mg total) by mouth 2 (two) times daily. 09/17/14   Ladell Pier, MD  ibuprofen (ADVIL,MOTRIN) 200 MG tablet Take  200-600 mg by mouth every 6 (six) hours as needed for headache or moderate pain.    Historical Provider, MD  lidocaine-prilocaine (EMLA) cream Appy small amount of cream over port site 1-2 hours prior to treatment and cover with plastic wrap.  DO NOT RUB IN Patient taking differently: Apply 1 application topically daily as needed. Appy small amount of cream over port site 1-2 hours prior to treatment and cover with plastic wrap.  DO NOT RUB IN 03/18/14   Owens Shark, NP  ondansetron  (ZOFRAN) 4 MG tablet Take 4 mg by mouth every 8 (eight) hours as needed for nausea or vomiting.    Historical Provider, MD  OVER THE COUNTER MEDICATION Take 30 mLs by mouth daily. [place] go tea supplyment    Historical Provider, MD  oxyCODONE-acetaminophen (PERCOCET/ROXICET) 5-325 MG per tablet Take 1 tablet by mouth every 4 (four) hours as needed for severe pain. 11/25/14   Ladell Pier, MD     Family History  Problem Relation Age of Onset  . Hypertension Mother   . Diabetes Mother   . Diabetes Maternal Grandmother   . Diabetes Maternal Grandfather     Social History   Social History  . Marital Status: Single    Spouse Name: N/A  . Number of Children: N/A  . Years of Education: College   Occupational History  .     Social History Main Topics  . Smoking status: Never Smoker   . Smokeless tobacco: Never Used  . Alcohol Use: Yes     Comment: very occasional- none since feb 2015 when pt got sick  . Drug Use: No  . Sexual Activity: Not on file   Other Topics Concern  . Not on file   Social History Narrative   Single. Lives with daughter age 19, Lonn [place], and patient's mother.    Work at [employer] as a Research scientist (physical sciences).   Owns her own car. Always wears seatbelt.      Vital Signs: LMP 11/03/2014 (Approximate)  Physical Exam  Constitutional: She is oriented to person, place, and time.  Abdominal: Soft. Bowel sounds are normal. There is tenderness.  Musculoskeletal: Normal range of motion.  Neurological: She is alert and oriented to person, place, and time.  Skin: Skin is warm and dry.  Skin site of biliary drain is clean and dry Tender at site and inferior to access No sign of infection No bleeding output bilious: 120 cc in bag now   Labs: 8/30  TB: 1.2 (2.7 8/25) LFTS decreasing Temp: 99 degrees  Imaging: No results found.  Labs:  CBC:  Recent Labs  10/19/14 1129 11/13/14 1448 11/18/14 0912 11/21/14 2312  WBC 5.1 6.3 4.7 12.8*  HGB 9.9*  10.9* 11.1* 10.7*  HCT 30.9* 32.8* 33.5* 33.2*  PLT 230 207 205 192    COAGS:  Recent Labs  06/24/14 1142 11/19/14 0714  INR 1.04 1.15  APTT 30  --     BMP:  Recent Labs  11/13/14 1448 11/18/14 0913 11/21/14 2312 11/24/14 1606  NA 137 139 129* 135*  K 4.1 4.4 3.7 3.6  CL  --   --  98*  --   CO2 21* 25 21* 23  GLUCOSE 94 111 127* 122  BUN 7.4 4.9* 8 7.6  CALCIUM 9.3 9.6 8.8* 8.9  CREATININE 0.8 0.8 0.84 0.7  GFRNONAA  --   --  >60  --   GFRAA  --   --  >60  --  LIVER FUNCTION TESTS:  Recent Labs  11/13/14 1448 11/18/14 0913 11/21/14 2312 11/24/14 1606  BILITOT 2.38* 2.73* 1.8* 1.21*  AST 214* 222* 85* 31  ALT 172* 167* 93* 53  ALKPHOS 457* 487* 301* 260*  PROT 8.6* 8.6* 8.8* 8.5*  ALBUMIN 2.8* 2.7* 2.9* 2.5*    Assessment:  Hx gastric cancer New abd wall and pancreatic mass Biliary obstruction I/E biliary drain placed 8/25 in IR Follow up injection today does show good output; but minimal draining internally Did not cap drain today.   Case discussed with Dr. Benay Spice.    1.) Schedule for tube check and up-size (at least 3F) to be done at [facility] next week or the week after.  Pt needs a CMP drawn that visit to document Br.    2.) The larger, more robust tube may promote sufficient internal drainage to allow for capping.    3.) Once Br is normal, Dr. Benay Spice will begin CTX.  Once we see how she responds to CTX and what her realistic prognosis will be we can consider converting to an internal stent.  The concern for stenting too early would be that she out lives her stent and it becomes occluded.    Attested: Criselda Peaches, MD  Signed: Reyann Troop A 11/26/2014, 3:21 PM

## 2014-11-27 LAB — COMPLETE METABOLIC PANEL WITH GFR
ALT: 31 U/L — AB (ref 6–29)
AST: 23 U/L (ref 10–30)
Albumin: 2.9 g/dL — ABNORMAL LOW (ref 3.6–5.1)
Alkaline Phosphatase: 212 U/L — ABNORMAL HIGH (ref 33–115)
BUN: 8 mg/dL (ref 7–25)
CHLORIDE: 96 mmol/L — AB (ref 98–110)
CO2: 25 mmol/L (ref 20–31)
CREATININE: 0.87 mg/dL (ref 0.50–1.10)
Calcium: 8.6 mg/dL (ref 8.6–10.2)
GFR, Est Non African American: 83 mL/min (ref >=60)
Glucose, Bld: 107 mg/dL — ABNORMAL HIGH (ref 65–99)
POTASSIUM: 3.9 mmol/L (ref 3.5–5.3)
Sodium: 134 mmol/L — ABNORMAL LOW (ref 135–146)
Total Bilirubin: 1.1 mg/dL (ref 0.2–1.2)
Total Protein: 8.1 g/dL (ref 6.1–8.1)

## 2014-11-27 LAB — CULTURE, BLOOD (ROUTINE X 2)
CULTURE: NO GROWTH
Culture: NO GROWTH

## 2014-12-01 ENCOUNTER — Ambulatory Visit (HOSPITAL_COMMUNITY): Payer: 59

## 2014-12-01 ENCOUNTER — Other Ambulatory Visit: Payer: 59

## 2014-12-02 ENCOUNTER — Ambulatory Visit (HOSPITAL_BASED_OUTPATIENT_CLINIC_OR_DEPARTMENT_OTHER): Payer: 59

## 2014-12-02 ENCOUNTER — Ambulatory Visit (HOSPITAL_BASED_OUTPATIENT_CLINIC_OR_DEPARTMENT_OTHER): Payer: 59 | Admitting: Nurse Practitioner

## 2014-12-02 ENCOUNTER — Telehealth: Payer: Self-pay | Admitting: Oncology

## 2014-12-02 ENCOUNTER — Encounter: Payer: Self-pay | Admitting: Nurse Practitioner

## 2014-12-02 ENCOUNTER — Other Ambulatory Visit: Payer: Self-pay | Admitting: *Deleted

## 2014-12-02 ENCOUNTER — Other Ambulatory Visit (HOSPITAL_BASED_OUTPATIENT_CLINIC_OR_DEPARTMENT_OTHER): Payer: 59

## 2014-12-02 VITALS — BP 118/72 | HR 104 | Temp 98.7°F | Resp 18 | Ht 66.0 in | Wt 136.2 lb

## 2014-12-02 DIAGNOSIS — Z95828 Presence of other vascular implants and grafts: Secondary | ICD-10-CM

## 2014-12-02 DIAGNOSIS — R109 Unspecified abdominal pain: Secondary | ICD-10-CM

## 2014-12-02 DIAGNOSIS — R634 Abnormal weight loss: Secondary | ICD-10-CM

## 2014-12-02 DIAGNOSIS — C169 Malignant neoplasm of stomach, unspecified: Secondary | ICD-10-CM

## 2014-12-02 LAB — COMPREHENSIVE METABOLIC PANEL (CC13)
ALBUMIN: 2.5 g/dL — AB (ref 3.5–5.0)
ALK PHOS: 173 U/L — AB (ref 40–150)
ALT: 16 U/L (ref 0–55)
ANION GAP: 8 meq/L (ref 3–11)
AST: 23 U/L (ref 5–34)
BILIRUBIN TOTAL: 0.77 mg/dL (ref 0.20–1.20)
BUN: 8.8 mg/dL (ref 7.0–26.0)
CALCIUM: 9.2 mg/dL (ref 8.4–10.4)
CO2: 26 meq/L (ref 22–29)
CREATININE: 1 mg/dL (ref 0.6–1.1)
Chloride: 101 mEq/L (ref 98–109)
EGFR: 80 mL/min/{1.73_m2} — AB (ref >90)
Glucose: 102 mg/dl (ref 70–140)
Potassium: 3.4 mEq/L — ABNORMAL LOW (ref 3.5–5.1)
Sodium: 135 mEq/L — ABNORMAL LOW (ref 136–145)
TOTAL PROTEIN: 8.9 g/dL — AB (ref 6.4–8.3)

## 2014-12-02 LAB — CBC WITH DIFFERENTIAL/PLATELET
BASO%: 0.2 % (ref 0.0–2.0)
Basophils Absolute: 0 10*3/uL (ref 0.0–0.1)
EOS ABS: 0 10*3/uL (ref 0.0–0.5)
EOS%: 0.2 % (ref 0.0–7.0)
HEMATOCRIT: 31.7 % — AB (ref 34.8–46.6)
HGB: 10.6 g/dL — ABNORMAL LOW (ref 11.6–15.9)
LYMPH#: 1.3 10*3/uL (ref 0.9–3.3)
LYMPH%: 14.2 % (ref 14.0–49.7)
MCH: 26.3 pg (ref 25.1–34.0)
MCHC: 33.4 g/dL (ref 31.5–36.0)
MCV: 78.7 fL — AB (ref 79.5–101.0)
MONO#: 1.2 10*3/uL — AB (ref 0.1–0.9)
MONO%: 13.3 % (ref 0.0–14.0)
NEUT%: 72.1 % (ref 38.4–76.8)
NEUTROS ABS: 6.6 10*3/uL — AB (ref 1.5–6.5)
PLATELETS: 331 10*3/uL (ref 145–400)
RBC: 4.03 10*6/uL (ref 3.70–5.45)
RDW: 17.7 % — ABNORMAL HIGH (ref 11.2–14.5)
WBC: 9.2 10*3/uL (ref 3.9–10.3)

## 2014-12-02 MED ORDER — ONDANSETRON HCL 4 MG PO TABS: 4.0000 mg | ORAL_TABLET | Freq: Three times a day (TID) | ORAL | Status: AC | PRN

## 2014-12-02 MED ORDER — HEPARIN SOD (PORK) LOCK FLUSH 100 UNIT/ML IV SOLN
500.0000 [IU] | Freq: Once | INTRAVENOUS | Status: AC
  Administered 2014-12-02: 500 [IU] via INTRAVENOUS
  Filled 2014-12-02: qty 5

## 2014-12-02 MED ORDER — SODIUM CHLORIDE 0.9 % IJ SOLN
10.0000 mL | INTRAMUSCULAR | Status: DC | PRN
Start: 2014-12-02 — End: 2014-12-02
  Administered 2014-12-02: 10 mL via INTRAVENOUS
  Filled 2014-12-02: qty 10

## 2014-12-02 NOTE — Progress Notes (Addendum)
[facility] OFFICE PROGRESS NOTE   Diagnosis:  Gastric cancer  INTERVAL HISTORY:   Andrea Best returns as scheduled. She continues to have an external biliary drain. She notes less output. She empties the bag about 2 times a day, partially full. Abdominal and left-sided back pain unchanged. She is taking Percocet as needed. She notes less nausea/vomiting. Bowels moving once a day. Appetite is poor. She is losing weight. No recent fever.  Objective:  Vital signs in last 24 hours:  Blood pressure 118/72, pulse 104, temperature 98.7 F (37.1 C), temperature source Oral, resp. rate 18, height _0  (1.676 m), weight 136 lb 3.2 oz (61.78 kg), last menstrual period 11/03/2014, SpO2 100 %.    HEENT: No thrush or ulcers. Lymphatics: No palpable cervical, supraclavicular or axillary lymph nodes. Resp: Lungs clear bilaterally. Cardio: Regular rate and rhythm. GI: Abdomen is tender. No hepatomegaly. No apparent ascites. Vascular: No leg edema. Port-A-Cath without erythema.   Lab Results:  Lab Results  Component Value Date   WBC 9.2 12/02/2014   HGB 10.6* 12/02/2014   HCT 31.7* 12/02/2014   MCV 78.7* 12/02/2014   PLT 331 12/02/2014   NEUTROABS 6.6* 12/02/2014    Imaging:  No results found.  Medications: I have reviewed the patient's current medications.  Assessment/Plan: 1. Gastric cancer status post upper endoscopy 09/22/2013 with findings of a partially obstructing oozing cratered gastric ulcer in the gastric antrum.  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   CT scans chest/abdomen/pelvis 10/03/2013 showed a 7 x 5 mm groundglass opacity in the superior segment of the right lower lobe; a 4.5 mm nodular density in the right upper lobe; bilateral axillary adenopathy; low density area anteriorly in the left hepatic lobe most consistent with fatty infiltration; another hypodense area within the medial segment of the left  hepatic lobe; severe gastric distention; mild bilateral inguinal adenopathy.   Subtotal gastrectomy and lymph node dissection with creation of a gastrojejunostomy 10/10/2013. No evidence of distant metastatic disease noted at the time of surgery. No evidence of liver metastases. 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 Tumor involved the serosal surface. Resection margins were negative. 10 of 17 lymph nodes were positive for metastatic adenocarcinoma. Her-2 not amplified   Cycle 1 weekly 5-FU/leucovorin 11/14/2013, dose reduced beginning with week 3 secondary to mucositis and hand/foot syndrome.   Cycle 2 weekly 5-FU/leucovorin beginning 12/19/2013.  Initiation of radiation/Xeloda 01/26/2014. She decided to discontinue further radiation/Xeloda after one fraction due to significant nausea/vomiting.  Cycle 3 weekly 5-FU/leucovorin beginning 02/13/2014  Cycle 4 weekly 5-FU/leucovorin beginning 03/18/2014  Cycle 5 weekly 5-FU/leucovorin beginning 04/28/2014  CTs 06/16/2014 with new enhancing nodular lesions along the ventral peritoneal surface  CT-guided biopsy of an anterior peritoneal lesion on 06/24/2014 confirmed poorly differentiated adenocarcinoma  CTs 09/03/2014 with an increasing confluent enhancing lesion located at the ventral peritoneal surface midline of the anterior abdomen. Lesion inseparable from the posterior aspect of the abdominal wall.  CT abdomen/pelvis 11/18/2014 with significant progression of metastatic disease with enlarging metastasis within the anterior abdominal wall, enlargement of multiple peritoneal implants, interval development of masses within the pancreatic head and caudate lobe of the liver. 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 New complex solid and cystic left adnexal lesion.  Placement of percutaneous internal/external biliary drain  11/19/2014 2. Gastric outlet obstruction secondary to #1. Resolved. 3. Nausea/vomiting secondary to #2. Resolved. 4. Abdominal pain secondary to #1  and #2. Improved. 5. History of Weight loss secondary to #1 and #2. 6. Microcytic anemia, likely iron deficiency. She received iron dextran 10/11/2013. 7. Lupus/Sjgren's. 8. Pneumonia during hospitalization July 2015.  9. Abdominal abscesses with body fluid culture 10/20/2013 showing moderate Candida tropicalis. She was discharged home on a 4 week course of fluconazole.  10. Mucositis and hand/foot syndrome following cycle 1-week #3 5-FU/leucovorin, the 5-FU and leucovorin were dose reduced. 11. 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 12. Status post LEEP 11/07/2013. Pathology shows moderate to severe dysplasia. Pap smear 06/08/2014-negative for intraepithelial lesions or malignancy   Disposition: [name] has progressive metastatic gastric cancer. The bilirubin has normalized since undergoing placement of the biliary drain. Dr. Benay Spice discussed the FOLFOX regimen with [name] at today's visit. We reviewed potential toxicities including myelosuppression, nausea/vomiting, allergic reaction. We discussed the potential for mouth sores, diarrhea, skin hyperpigmentation, hand foot syndrome with 5-fluorouracil. We discussed the neurotoxicity associated with oxaliplatin including cold sensitivity, acute laryngopharyngeal dysesthesia, peripheral neuropathy and more rare occurrences such as diplopia, ataxia, jaw pain, incontinence. She is agreeable to proceed.  She is interested in artificial nutrition. We discussed TPN versus placement of a feeding tube. She does not want to undergo another procedure. She would like to proceed with TPN. We will make a referral to a home care agency.  The plan is for her to begin cycle 1 FOLFOX on 12/09/2014. We will see her in follow-up on  12/23/2014. She will contact the office in the interim with any problems.  Patient seen with Dr. Benay Spice. 35 minutes were spent face-to-face at today's visit with the majority of that time involved in counseling/coordination of care.    Ned Card ANP/GNP-BC   12/02/2014  1:17 PM This was a shared visit with Ned Card. The bilirubin has normalized. The plan is to begin systemic chemotherapy. We reviewed the potential toxicities associated with FOLFOX and she agrees to proceed. She will receive Neulasta support based on her history of neutropenia.  Her nutritional status remains poor. We will begin home TPN. It would be difficult to place an enteral feeding tube with her previous surgery.  Julieanne Manson, M.D.

## 2014-12-02 NOTE — Patient Instructions (Signed)

## 2014-12-02 NOTE — Patient Instructions (Signed)
Leucovorin injection What is this medicine? LEUCOVORIN (loo koe VOR in) is used to prevent or treat the harmful effects of some medicines. This medicine is used to treat anemia caused by a low amount of folic acid in the body. It is also used with 5-fluorouracil (5-FU) to treat colon cancer. This medicine may be used for other purposes; ask your health care provider or pharmacist if you have questions. What should I tell my health care provider before I take this medicine? They need to know if you have any of these conditions: -anemia from low levels of vitamin B-12 in the blood -an unusual or allergic reaction to leucovorin, folic acid, other medicines, foods, dyes, or preservatives -pregnant or trying to get pregnant -breast-feeding How should I use this medicine? This medicine is for injection into a muscle or into a vein. It is given by a health care professional in a hospital or clinic setting. Talk to your pediatrician regarding the use of this medicine in children. Special care may be needed. Overdosage: If you think you have taken too much of this medicine contact a poison control center or emergency room at once. NOTE: This medicine is only for you. Do not share this medicine with others. What if I miss a dose? This does not apply. What may interact with this medicine? -capecitabine -fluorouracil -phenobarbital -phenytoin -primidone -trimethoprim-sulfamethoxazole This list may not describe all possible interactions. Give your health care provider a list of all the medicines, herbs, non-prescription drugs, or dietary supplements you use. Also tell them if you smoke, drink alcohol, or use illegal drugs. Some items may interact with your medicine. What should I watch for while using this medicine? Your condition will be monitored carefully while you are receiving this medicine. This medicine may increase the side effects of 5-fluorouracil, 5-FU. Tell your doctor or health care  professional if you have diarrhea or mouth sores that do not get better or that get worse. What side effects may I notice from receiving this medicine? Side effects that you should report to your doctor or health care professional as soon as possible: -allergic reactions like skin rash, itching or hives, swelling of the face, lips, or tongue -breathing problems -fever, infection -mouth sores -unusual bleeding or bruising -unusually weak or tired Side effects that usually do not require medical attention (report to your doctor or health care professional if they continue or are bothersome): -constipation or diarrhea -loss of appetite -nausea, vomiting This list may not describe all possible side effects. Call your doctor for medical advice about side effects. You may report side effects to FDA at 1-800-FDA-1088. Where should I keep my medicine? This drug is given in a hospital or clinic and will not be stored at home. NOTE: This sheet is a summary. It may not cover all possible information. If you have questions about this medicine, talk to your doctor, pharmacist, or health care provider.  2015, Else[employer] Standard. (2007-09-17 16:50:29) Fluorouracil, 5-FU injection What is this medicine? FLUOROURACIL, 5-FU (flure oh YOOR a sil) is a chemotherapy drug. It slows the growth of cancer cells. This medicine is used to treat many types of cancer like breast cancer, colon or rectal cancer, pancreatic cancer, and stomach cancer. This medicine may be used for other purposes; ask your health care provider or pharmacist if you have questions. COMMON BRAND NAME(S): Adrucil What should I tell my health care provider before I take this medicine? They need to know if you have any of these conditions: -  blood disorders -dihydropyrimidine dehydrogenase (DPD) deficiency -infection (especially a virus infection such as chickenpox, cold sores, or herpes) -kidney disease -liver disease -malnourished,  poor nutrition -recent or ongoing radiation therapy -an unusual or allergic reaction to fluorouracil, other chemotherapy, other medicines, foods, dyes, or preservatives -pregnant or trying to get pregnant -breast-feeding How should I use this medicine? This drug is given as an infusion or injection into a vein. It is administered in a hospital or clinic by a specially trained health care professional. Talk to your pediatrician regarding the use of this medicine in children. Special care may be needed. Overdosage: If you think you have taken too much of this medicine contact a poison control center or emergency room at once. NOTE: This medicine is only for you. Do not share this medicine with others. What if I miss a dose? It is important not to miss your dose. Call your doctor or health care professional if you are unable to keep an appointment. What may interact with this medicine? -allopurinol -cimetidine -dapsone -digoxin -hydroxyurea -leucovorin -levamisole -medicines for seizures like ethotoin, fosphenytoin, phenytoin -medicines to increase blood counts like filgrastim, pegfilgrastim, sargramostim -medicines that treat or prevent blood clots like warfarin, enoxaparin, and dalteparin -methotrexate -metronidazole -pyrimethamine -some other chemotherapy drugs like busulfan, cisplatin, estramustine, vinblastine -trimethoprim -trimetrexate -vaccines Talk to your doctor or health care professional before taking any of these medicines: -acetaminophen -aspirin -ibuprofen -ketoprofen -naproxen This list may not describe all possible interactions. Give your health care provider a list of all the medicines, herbs, non-prescription drugs, or dietary supplements you use. Also tell them if you smoke, drink alcohol, or use illegal drugs. Some items may interact with your medicine. What should I watch for while using this medicine? Visit your doctor for checks on your progress. This drug  may make you feel generally unwell. This is not uncommon, as chemotherapy can affect healthy cells as well as cancer cells. Report any side effects. Continue your course of treatment even though you feel ill unless your doctor tells you to stop. In some cases, you may be given additional medicines to help with side effects. Follow all directions for their use. Call your doctor or health care professional for advice if you get a fever, chills or sore throat, or other symptoms of a cold or flu. Do not treat yourself. This drug decreases your body's ability to fight infections. Try to avoid being around people who are sick. This medicine may increase your risk to bruise or bleed. Call your doctor or health care professional if you notice any unusual bleeding. Be careful brushing and flossing your teeth or using a toothpick because you may get an infection or bleed more easily. If you have any dental work done, tell your dentist you are receiving this medicine. Avoid taking products that contain aspirin, acetaminophen, ibuprofen, naproxen, or ketoprofen unless instructed by your doctor. These medicines may hide a fever. Do not become pregnant while taking this medicine. Women should inform their doctor if they wish to become pregnant or think they might be pregnant. There is a potential for serious side effects to an unborn child. Talk to your health care professional or pharmacist for more information. Do not breast-feed an infant while taking this medicine. Men should inform their doctor if they wish to father a child. This medicine may lower sperm counts. Do not treat diarrhea with over the counter products. Contact your doctor if you have diarrhea that lasts more than 2 days or if it   is severe and watery. This medicine can make you more sensitive to the sun. Keep out of the sun. If you cannot avoid being in the sun, wear protective clothing and use sunscreen. Do not use sun lamps or tanning  beds/booths. What side effects may I notice from receiving this medicine? Side effects that you should report to your doctor or health care professional as soon as possible: -allergic reactions like skin rash, itching or hives, swelling of the face, lips, or tongue -low blood counts - this medicine may decrease the number of white blood cells, red blood cells and platelets. You may be at increased risk for infections and bleeding. -signs of infection - fever or chills, cough, sore throat, pain or difficulty passing urine -signs of decreased platelets or bleeding - bruising, pinpoint red spots on the skin, black, tarry stools, blood in the urine -signs of decreased red blood cells - unusually weak or tired, fainting spells, lightheadedness -breathing problems -changes in vision -chest pain -mouth sores -nausea and vomiting -pain, swelling, redness at site where injected -pain, tingling, numbness in the hands or feet -redness, swelling, or sores on hands or feet -stomach pain -unusual bleeding Side effects that usually do not require medical attention (report to your doctor or health care professional if they continue or are bothersome): -changes in finger or toe nails -diarrhea -dry or itchy skin -hair loss -headache -loss of appetite -sensitivity of eyes to the light -stomach upset -unusually teary eyes This list may not describe all possible side effects. Call your doctor for medical advice about side effects. You may report side effects to FDA at 1-800-FDA-1088. Where should I keep my medicine? This drug is given in a hospital or clinic and will not be stored at home. NOTE: This sheet is a summary. It may not cover all possible information. If you have questions about this medicine, talk to your doctor, pharmacist, or health care provider.  2015, [employer] Standard. (2007-07-17 13:53:16) Oxaliplatin Injection What is this medicine? OXALIPLATIN (ox AL i PLA tin) is a  chemotherapy drug. It targets fast dividing cells, like cancer cells, and causes these cells to die. This medicine is used to treat cancers of the colon and rectum, and many other cancers. This medicine may be used for other purposes; ask your health care provider or pharmacist if you have questions. COMMON BRAND NAME(S): Eloxatin What should I tell my health care provider before I take this medicine? They need to know if you have any of these conditions: -kidney disease -an unusual or allergic reaction to oxaliplatin, other chemotherapy, other medicines, foods, dyes, or preservatives -pregnant or trying to get pregnant -breast-feeding How should I use this medicine? This drug is given as an infusion into a vein. It is administered in a hospital or clinic by a specially trained health care professional. Talk to your pediatrician regarding the use of this medicine in children. Special care may be needed. Overdosage: If you think you have taken too much of this medicine contact a poison control center or emergency room at once. NOTE: This medicine is only for you. Do not share this medicine with others. What if I miss a dose? It is important not to miss a dose. Call your doctor or health care professional if you are unable to keep an appointment. What may interact with this medicine? -medicines to increase blood counts like filgrastim, pegfilgrastim, sargramostim -probenecid -some antibiotics like amikacin, gentamicin, neomycin, polymyxin B, streptomycin, tobramycin -zalcitabine Talk to your doctor or   health care professional before taking any of these medicines: -acetaminophen -aspirin -ibuprofen -ketoprofen -naproxen This list may not describe all possible interactions. Give your health care provider a list of all the medicines, herbs, non-prescription drugs, or dietary supplements you use. Also tell them if you smoke, drink alcohol, or use illegal drugs. Some items may interact with your  medicine. What should I watch for while using this medicine? Your condition will be monitored carefully while you are receiving this medicine. You will need important blood work done while you are taking this medicine. This medicine can make you more sensitive to cold. Do not drink cold drinks or use ice. Cover exposed skin before coming in contact with cold temperatures or cold objects. When out in cold weather wear warm clothing and cover your mouth and nose to warm the air that goes into your lungs. Tell your doctor if you get sensitive to the cold. This drug may make you feel generally unwell. This is not uncommon, as chemotherapy can affect healthy cells as well as cancer cells. Report any side effects. Continue your course of treatment even though you feel ill unless your doctor tells you to stop. In some cases, you may be given additional medicines to help with side effects. Follow all directions for their use. Call your doctor or health care professional for advice if you get a fever, chills or sore throat, or other symptoms of a cold or flu. Do not treat yourself. This drug decreases your body's ability to fight infections. Try to avoid being around people who are sick. This medicine may increase your risk to bruise or bleed. Call your doctor or health care professional if you notice any unusual bleeding. Be careful brushing and flossing your teeth or using a toothpick because you may get an infection or bleed more easily. If you have any dental work done, tell your dentist you are receiving this medicine. Avoid taking products that contain aspirin, acetaminophen, ibuprofen, naproxen, or ketoprofen unless instructed by your doctor. These medicines may hide a fever. Do not become pregnant while taking this medicine. Women should inform their doctor if they wish to become pregnant or think they might be pregnant. There is a potential for serious side effects to an unborn child. Talk to your health  care professional or pharmacist for more information. Do not breast-feed an infant while taking this medicine. Call your doctor or health care professional if you get diarrhea. Do not treat yourself. What side effects may I notice from receiving this medicine? Side effects that you should report to your doctor or health care professional as soon as possible: -allergic reactions like skin rash, itching or hives, swelling of the face, lips, or tongue -low blood counts - This drug may decrease the number of white blood cells, red blood cells and platelets. You may be at increased risk for infections and bleeding. -signs of infection - fever or chills, cough, sore throat, pain or difficulty passing urine -signs of decreased platelets or bleeding - bruising, pinpoint red spots on the skin, black, tarry stools, nosebleeds -signs of decreased red blood cells - unusually weak or tired, fainting spells, lightheadedness -breathing problems -chest pain, pressure -cough -diarrhea -jaw tightness -mouth sores -nausea and vomiting -pain, swelling, redness or irritation at the injection site -pain, tingling, numbness in the hands or feet -problems with balance, talking, walking -redness, blistering, peeling or loosening of the skin, including inside the mouth -trouble passing urine or change in the amount of urine   Side effects that usually do not require medical attention (report to your doctor or health care professional if they continue or are bothersome): -changes in vision -constipation -hair loss -loss of appetite -metallic taste in the mouth or changes in taste -stomach pain This list may not describe all possible side effects. Call your doctor for medical advice about side effects. You may report side effects to FDA at 1-800-FDA-1088. Where should I keep my medicine? This drug is given in a hospital or clinic and will not be stored at home. NOTE: This sheet is a summary. It may not cover all  possible information. If you have questions about this medicine, talk to your doctor, pharmacist, or health care provider.  2015, [employer] Standard. (2007-10-08 17:22:47)  

## 2014-12-02 NOTE — Telephone Encounter (Signed)
Gave adn printed appt sched and avs for pt for  Sept and OCT emailed MW to add tx

## 2014-12-03 ENCOUNTER — Telehealth: Payer: Self-pay | Admitting: Nurse Practitioner

## 2014-12-03 ENCOUNTER — Telehealth: Payer: Self-pay | Admitting: *Deleted

## 2014-12-03 NOTE — Telephone Encounter (Signed)
Call placed to [facility] spoke with Levada Dy to set up Home TPN to be cycled overnight and held on chemotherapy days. Fax sent to [phone]

## 2014-12-03 NOTE — Telephone Encounter (Signed)
Per staff message and POF I have scheduled appts. Advised scheduler of appts and to move lab . JMW  

## 2014-12-04 ENCOUNTER — Ambulatory Visit: Payer: 59 | Admitting: Nutrition

## 2014-12-04 ENCOUNTER — Other Ambulatory Visit: Payer: Self-pay | Admitting: Nurse Practitioner

## 2014-12-04 NOTE — Progress Notes (Signed)
Contacted patient by phone. Patient is familiar past visits.   She is a patient with gastric cancer status post subtotal gastrectomy in 2015. Patient has had weight loss and is beginning TNA for nutrition support. Contacted patient by phone who reports she does still try to eat small amounts often. She does not have a great appetite. She still experiences dumping syndrome. Educated patient to continue small frequent meals and snacks using foods as tolerated. Patient requests samples  of clear liquid juice supplements which I will leave for her at the front desk. Educated patient to take no more than 4 ounces at a time and watch for signs of dumping syndrome.   Will follow-up with patient on Wednesday, September 28, during chemotherapy.

## 2014-12-07 ENCOUNTER — Other Ambulatory Visit: Payer: Self-pay | Admitting: *Deleted

## 2014-12-07 MED ORDER — OXYCODONE-ACETAMINOPHEN 5-325 MG PO TABS: 1.0000 | ORAL_TABLET | ORAL | Status: DC | PRN

## 2014-12-07 NOTE — Telephone Encounter (Signed)
Notified pt that re-fill for percocet is ready for pick-up.  Pt verbalized understanding and expressed appreciation for call.

## 2014-12-09 ENCOUNTER — Other Ambulatory Visit: Payer: 59

## 2014-12-09 ENCOUNTER — Ambulatory Visit (HOSPITAL_BASED_OUTPATIENT_CLINIC_OR_DEPARTMENT_OTHER): Payer: 59

## 2014-12-09 ENCOUNTER — Other Ambulatory Visit (HOSPITAL_BASED_OUTPATIENT_CLINIC_OR_DEPARTMENT_OTHER): Payer: 59

## 2014-12-09 VITALS — BP 113/65 | HR 101 | Temp 98.3°F | Resp 16

## 2014-12-09 DIAGNOSIS — C169 Malignant neoplasm of stomach, unspecified: Secondary | ICD-10-CM

## 2014-12-09 DIAGNOSIS — Z5111 Encounter for antineoplastic chemotherapy: Secondary | ICD-10-CM

## 2014-12-09 LAB — CBC WITH DIFFERENTIAL/PLATELET
BASO%: 0.3 % (ref 0.0–2.0)
Basophils Absolute: 0 10*3/uL (ref 0.0–0.1)
EOS%: 0.4 % (ref 0.0–7.0)
Eosinophils Absolute: 0 10*3/uL (ref 0.0–0.5)
HCT: 30.7 % — ABNORMAL LOW (ref 34.8–46.6)
HGB: 9.9 g/dL — ABNORMAL LOW (ref 11.6–15.9)
LYMPH%: 11.1 % — AB (ref 14.0–49.7)
MCH: 25.7 pg (ref 25.1–34.0)
MCHC: 32.1 g/dL (ref 31.5–36.0)
MCV: 80 fL (ref 79.5–101.0)
MONO#: 0.8 10*3/uL (ref 0.1–0.9)
MONO%: 9.9 % (ref 0.0–14.0)
NEUT%: 78.3 % — ABNORMAL HIGH (ref 38.4–76.8)
NEUTROS ABS: 6.5 10*3/uL (ref 1.5–6.5)
PLATELETS: 286 10*3/uL (ref 145–400)
RBC: 3.84 10*6/uL (ref 3.70–5.45)
RDW: 18.6 % — ABNORMAL HIGH (ref 11.2–14.5)
WBC: 8.3 10*3/uL (ref 3.9–10.3)
lymph#: 0.9 10*3/uL (ref 0.9–3.3)

## 2014-12-09 LAB — COMPREHENSIVE METABOLIC PANEL (CC13)
ALT: 10 U/L (ref 0–55)
ANION GAP: 6 meq/L (ref 3–11)
AST: 20 U/L (ref 5–34)
Albumin: 2.5 g/dL — ABNORMAL LOW (ref 3.5–5.0)
Alkaline Phosphatase: 143 U/L (ref 40–150)
BILIRUBIN TOTAL: 0.79 mg/dL (ref 0.20–1.20)
BUN: 8.5 mg/dL (ref 7.0–26.0)
CHLORIDE: 101 meq/L (ref 98–109)
CO2: 28 meq/L (ref 22–29)
CREATININE: 0.9 mg/dL (ref 0.6–1.1)
Calcium: 9.4 mg/dL (ref 8.4–10.4)
EGFR: >90 mL/min/{1.73_m2} (ref >90)
GLUCOSE: 110 mg/dL (ref 70–140)
Potassium: 4.7 mEq/L (ref 3.5–5.1)
SODIUM: 134 meq/L — AB (ref 136–145)
TOTAL PROTEIN: 8.5 g/dL — AB (ref 6.4–8.3)

## 2014-12-09 MED ORDER — FLUOROURACIL CHEMO INJECTION 500 MG/10ML
300.0000 mg/m2 | Freq: Once | INTRAVENOUS | Status: AC
  Administered 2014-12-09: 500 mg via INTRAVENOUS
  Filled 2014-12-09: qty 10

## 2014-12-09 MED ORDER — SODIUM CHLORIDE 0.9 % IV SOLN
Freq: Once | INTRAVENOUS | Status: AC
  Administered 2014-12-09: 15:00:00 via INTRAVENOUS
  Filled 2014-12-09: qty 4

## 2014-12-09 MED ORDER — DEXTROSE 5 % IV SOLN
Freq: Once | INTRAVENOUS | Status: AC
  Administered 2014-12-09: 15:00:00 via INTRAVENOUS

## 2014-12-09 MED ORDER — OXALIPLATIN CHEMO INJECTION 100 MG/20ML
85.0000 mg/m2 | Freq: Once | INTRAVENOUS | Status: AC
  Administered 2014-12-09: 145 mg via INTRAVENOUS
  Filled 2014-12-09: qty 29

## 2014-12-09 MED ORDER — SODIUM CHLORIDE 0.9 % IV SOLN
1800.0000 mg/m2 | INTRAVENOUS | Status: DC
  Administered 2014-12-09: 3050 mg via INTRAVENOUS
  Filled 2014-12-09: qty 61

## 2014-12-09 MED ORDER — LEUCOVORIN CALCIUM INJECTION 350 MG
300.0000 mg/m2 | Freq: Once | INTRAVENOUS | Status: AC
  Administered 2014-12-09: 510 mg via INTRAVENOUS
  Filled 2014-12-09: qty 25.5

## 2014-12-09 NOTE — Patient Instructions (Signed)
[facility] Discharge Instructions for Patients Receiving Chemotherapy  Today you received the following chemotherapy agents oxaliplatin, leukovorin, fluorouricil  To help prevent nausea and vomiting after your treatment, we encourage you to take your nausea medication   If you develop nausea and vomiting that is not controlled by your nausea medication, call the clinic.   BELOW ARE SYMPTOMS THAT SHOULD BE REPORTED IMMEDIATELY:  *FEVER GREATER THAN 100.5 F  *CHILLS WITH OR WITHOUT FEVER  NAUSEA AND VOMITING THAT IS NOT CONTROLLED WITH YOUR NAUSEA MEDICATION  *UNUSUAL SHORTNESS OF BREATH  *UNUSUAL BRUISING OR BLEEDING  TENDERNESS IN MOUTH AND THROAT WITH OR WITHOUT PRESENCE OF ULCERS  *URINARY PROBLEMS  *BOWEL PROBLEMS  UNUSUAL RASH Items with * indicate a potential emergency and should be followed up as soon as possible.  Feel free to call the clinic you have any questions or concerns. The clinic phone number is [phone].  Please show the [facility] at check-in to the Emergency Department and triage nurse.  Oxaliplatin Injection What is this medicine? OXALIPLATIN (ox AL i PLA tin) is a chemotherapy drug. It targets fast dividing cells, like cancer cells, and causes these cells to die. This medicine is used to treat cancers of the colon and rectum, and many other cancers. This medicine may be used for other purposes; ask your health care provider or pharmacist if you have questions. COMMON BRAND NAME(S): Eloxatin What should I tell my health care provider before I take this medicine? They need to know if you have any of these conditions: -kidney disease -an unusual or allergic reaction to oxaliplatin, other chemotherapy, other medicines, foods, dyes, or preservatives -pregnant or trying to get pregnant -breast-feeding How should I use this medicine? This drug is given as an infusion into a vein. It is administered in a hospital or clinic by a  specially trained health care professional. Talk to your pediatrician regarding the use of this medicine in children. Special care may be needed. Overdosage: If you think you have taken too much of this medicine contact a poison control center or emergency room at once. NOTE: This medicine is only for you. Do not share this medicine with others. What if I miss a dose? It is important not to miss a dose. Call your doctor or health care professional if you are unable to keep an appointment. What may interact with this medicine? -medicines to increase blood counts like filgrastim, pegfilgrastim, sargramostim -probenecid -some antibiotics like amikacin, gentamicin, neomycin, polymyxin B, streptomycin, tobramycin -zalcitabine Talk to your doctor or health care professional before taking any of these medicines: -acetaminophen -aspirin -ibuprofen -ketoprofen -naproxen This list may not describe all possible interactions. Give your health care provider a list of all the medicines, herbs, non-prescription drugs, or dietary supplements you use. Also tell them if you smoke, drink alcohol, or use illegal drugs. Some items may interact with your medicine. What should I watch for while using this medicine? Your condition will be monitored carefully while you are receiving this medicine. You will need important blood work done while you are taking this medicine. This medicine can make you more sensitive to cold. Do not drink cold drinks or use ice. Cover exposed skin before coming in contact with cold temperatures or cold objects. When out in cold weather wear warm clothing and cover your mouth and nose to warm the air that goes into your lungs. Tell your doctor if you get sensitive to the cold. This drug may make  you feel generally unwell. This is not uncommon, as chemotherapy can affect healthy cells as well as cancer cells. Report any side effects. Continue your course of treatment even though you feel ill  unless your doctor tells you to stop. In some cases, you may be given additional medicines to help with side effects. Follow all directions for their use. Call your doctor or health care professional for advice if you get a fever, chills or sore throat, or other symptoms of a cold or flu. Do not treat yourself. This drug decreases your body's ability to fight infections. Try to avoid being around people who are sick. This medicine may increase your risk to bruise or bleed. Call your doctor or health care professional if you notice any unusual bleeding. Be careful brushing and flossing your teeth or using a toothpick because you may get an infection or bleed more easily. If you have any dental work done, tell your dentist you are receiving this medicine. Avoid taking products that contain aspirin, acetaminophen, ibuprofen, naproxen, or ketoprofen unless instructed by your doctor. These medicines may hide a fever. Do not become pregnant while taking this medicine. Women should inform their doctor if they wish to become pregnant or think they might be pregnant. There is a potential for serious side effects to an unborn child. Talk to your health care professional or pharmacist for more information. Do not breast-feed an infant while taking this medicine. Call your doctor or health care professional if you get diarrhea. Do not treat yourself. What side effects may I notice from receiving this medicine? Side effects that you should report to your doctor or health care professional as soon as possible: -allergic reactions like skin rash, itching or hives, swelling of the face, lips, or tongue -low blood counts - This drug may decrease the number of white blood cells, red blood cells and platelets. You may be at increased risk for infections and bleeding. -signs of infection - fever or chills, cough, sore throat, pain or difficulty passing urine -signs of decreased platelets or bleeding - bruising, pinpoint  red spots on the skin, black, tarry stools, nosebleeds -signs of decreased red blood cells - unusually weak or tired, fainting spells, lightheadedness -breathing problems -chest pain, pressure -cough -diarrhea -jaw tightness -mouth sores -nausea and vomiting -pain, swelling, redness or irritation at the injection site -pain, tingling, numbness in the hands or feet -problems with balance, talking, walking -redness, blistering, peeling or loosening of the skin, including inside the mouth -trouble passing urine or change in the amount of urine Side effects that usually do not require medical attention (report to your doctor or health care professional if they continue or are bothersome): -changes in vision -constipation -hair loss -loss of appetite -metallic taste in the mouth or changes in taste -stomach pain This list may not describe all possible side effects. Call your doctor for medical advice about side effects. You may report side effects to FDA at 1-800-FDA-1088. Where should I keep my medicine? This drug is given in a hospital or clinic and will not be stored at home. NOTE: This sheet is a summary. It may not cover all possible information. If you have questions about this medicine, talk to your doctor, pharmacist, or health care provider.  2015, Elsevier/Gold Standard. (2007-10-08 17:22:47)   Fluorouracil, 5-FU injection What is this medicine? FLUOROURACIL, 5-FU (flure oh YOOR a sil) is a chemotherapy drug. It slows the growth of cancer cells. This medicine is used to treat many  types of cancer like breast cancer, colon or rectal cancer, pancreatic cancer, and stomach cancer. This medicine may be used for other purposes; ask your health care provider or pharmacist if you have questions. COMMON BRAND NAME(S): Adrucil What should I tell my health care provider before I take this medicine? They need to know if you have any of these conditions: -blood  disorders -dihydropyrimidine dehydrogenase (DPD) deficiency -infection (especially a virus infection such as chickenpox, cold sores, or herpes) -kidney disease -liver disease -malnourished, poor nutrition -recent or ongoing radiation therapy -an unusual or allergic reaction to fluorouracil, other chemotherapy, other medicines, foods, dyes, or preservatives -pregnant or trying to get pregnant -breast-feeding How should I use this medicine? This drug is given as an infusion or injection into a vein. It is administered in a hospital or clinic by a specially trained health care professional. Talk to your pediatrician regarding the use of this medicine in children. Special care may be needed. Overdosage: If you think you have taken too much of this medicine contact a poison control center or emergency room at once. NOTE: This medicine is only for you. Do not share this medicine with others. What if I miss a dose? It is important not to miss your dose. Call your doctor or health care professional if you are unable to keep an appointment. What may interact with this medicine? -allopurinol -cimetidine -dapsone -digoxin -hydroxyurea -leucovorin -levamisole -medicines for seizures like ethotoin, fosphenytoin, phenytoin -medicines to increase blood counts like filgrastim, pegfilgrastim, sargramostim -medicines that treat or prevent blood clots like warfarin, enoxaparin, and dalteparin -methotrexate -metronidazole -pyrimethamine -some other chemotherapy drugs like busulfan, cisplatin, estramustine, vinblastine -trimethoprim -trimetrexate -vaccines Talk to your doctor or health care professional before taking any of these medicines: -acetaminophen -aspirin -ibuprofen -ketoprofen -naproxen This list may not describe all possible interactions. Give your health care provider a list of all the medicines, herbs, non-prescription drugs, or dietary supplements you use. Also tell them if you  smoke, drink alcohol, or use illegal drugs. Some items may interact with your medicine. What should I watch for while using this medicine? Visit your doctor for checks on your progress. This drug may make you feel generally unwell. This is not uncommon, as chemotherapy can affect healthy cells as well as cancer cells. Report any side effects. Continue your course of treatment even though you feel ill unless your doctor tells you to stop. In some cases, you may be given additional medicines to help with side effects. Follow all directions for their use. Call your doctor or health care professional for advice if you get a fever, chills or sore throat, or other symptoms of a cold or flu. Do not treat yourself. This drug decreases your body's ability to fight infections. Try to avoid being around people who are sick. This medicine may increase your risk to bruise or bleed. Call your doctor or health care professional if you notice any unusual bleeding. Be careful brushing and flossing your teeth or using a toothpick because you may get an infection or bleed more easily. If you have any dental work done, tell your dentist you are receiving this medicine. Avoid taking products that contain aspirin, acetaminophen, ibuprofen, naproxen, or ketoprofen unless instructed by your doctor. These medicines may hide a fever. Do not become pregnant while taking this medicine. Women should inform their doctor if they wish to become pregnant or think they might be pregnant. There is a potential for serious side effects to an unborn child. Talk  to your health care professional or pharmacist for more information. Do not breast-feed an infant while taking this medicine. Men should inform their doctor if they wish to father a child. This medicine may lower sperm counts. Do not treat diarrhea with over the counter products. Contact your doctor if you have diarrhea that lasts more than 2 days or if it is severe and watery. This  medicine can make you more sensitive to the sun. Keep out of the sun. If you cannot avoid being in the sun, wear protective clothing and use sunscreen. Do not use sun lamps or tanning beds/booths. What side effects may I notice from receiving this medicine? Side effects that you should report to your doctor or health care professional as soon as possible: -allergic reactions like skin rash, itching or hives, swelling of the face, lips, or tongue -low blood counts - this medicine may decrease the number of white blood cells, red blood cells and platelets. You may be at increased risk for infections and bleeding. -signs of infection - fever or chills, cough, sore throat, pain or difficulty passing urine -signs of decreased platelets or bleeding - bruising, pinpoint red spots on the skin, black, tarry stools, blood in the urine -signs of decreased red blood cells - unusually weak or tired, fainting spells, lightheadedness -breathing problems -changes in vision -chest pain -mouth sores -nausea and vomiting -pain, swelling, redness at site where injected -pain, tingling, numbness in the hands or feet -redness, swelling, or sores on hands or feet -stomach pain -unusual bleeding Side effects that usually do not require medical attention (report to your doctor or health care professional if they continue or are bothersome): -changes in finger or toe nails -diarrhea -dry or itchy skin -hair loss -headache -loss of appetite -sensitivity of eyes to the light -stomach upset -unusually teary eyes This list may not describe all possible side effects. Call your doctor for medical advice about side effects. You may report side effects to FDA at 1-800-FDA-1088. Where should I keep my medicine? This drug is given in a hospital or clinic and will not be stored at home. NOTE: This sheet is a summary. It may not cover all possible information. If you have questions about this medicine, talk to your doctor,  pharmacist, or health care provider.  2015, Elsevier/Gold Standard. (2007-07-17 13:53:16)

## 2014-12-10 ENCOUNTER — Telehealth: Payer: Self-pay | Admitting: *Deleted

## 2014-12-10 NOTE — Telephone Encounter (Signed)
Attempted to contact pt re: update after 1st chemo 9/14.  Left voice message to call triage/desk RN with update.

## 2014-12-10 NOTE — Telephone Encounter (Signed)
-----   Message from Patton Salles, RN sent at 12/09/2014  6:13 PM EDT ----- Regarding: Benay Spice, chemo f/u call  Pt of Dr. Benay Spice, first time [facility]. Pt tolerated well

## 2014-12-11 ENCOUNTER — Ambulatory Visit: Payer: 59

## 2014-12-11 ENCOUNTER — Ambulatory Visit (HOSPITAL_BASED_OUTPATIENT_CLINIC_OR_DEPARTMENT_OTHER): Payer: 59

## 2014-12-11 ENCOUNTER — Other Ambulatory Visit: Payer: Self-pay | Admitting: Nurse Practitioner

## 2014-12-11 VITALS — BP 106/75 | HR 115 | Temp 98.7°F

## 2014-12-11 DIAGNOSIS — C169 Malignant neoplasm of stomach, unspecified: Secondary | ICD-10-CM | POA: Diagnosis not present

## 2014-12-11 DIAGNOSIS — Z5189 Encounter for other specified aftercare: Secondary | ICD-10-CM | POA: Diagnosis not present

## 2014-12-11 MED ORDER — HEPARIN SOD (PORK) LOCK FLUSH 100 UNIT/ML IV SOLN
500.0000 [IU] | Freq: Once | INTRAVENOUS | Status: AC | PRN
  Administered 2014-12-11: 500 [IU]
  Filled 2014-12-11: qty 5

## 2014-12-11 MED ORDER — PEGFILGRASTIM INJECTION 6 MG/0.6ML ~~LOC~~
6.0000 mg | PREFILLED_SYRINGE | Freq: Once | SUBCUTANEOUS | Status: AC
  Administered 2014-12-11: 6 mg via SUBCUTANEOUS
  Filled 2014-12-11: qty 0.6

## 2014-12-11 MED ORDER — LORAZEPAM 0.5 MG PO TABS: 0.5000 mg | ORAL_TABLET | Freq: Three times a day (TID) | ORAL | Status: DC | PRN

## 2014-12-11 MED ORDER — SODIUM CHLORIDE 0.9 % IJ SOLN
10.0000 mL | INTRAMUSCULAR | Status: DC | PRN
  Administered 2014-12-11: 10 mL
  Filled 2014-12-11: qty 10

## 2014-12-11 NOTE — Progress Notes (Signed)
Neulasta injection given by flush nurse 

## 2014-12-15 ENCOUNTER — Telehealth: Payer: Self-pay | Admitting: *Deleted

## 2014-12-15 NOTE — Telephone Encounter (Signed)
TC received from patient regarding getting a refill on her percocet 5/325. This was last filled on 12/07/14 for #60 tablets Please call pt when this prescription is available for pick up.

## 2014-12-16 ENCOUNTER — Other Ambulatory Visit: Payer: Self-pay | Admitting: *Deleted

## 2014-12-16 MED ORDER — OXYCODONE-ACETAMINOPHEN 5-325 MG PO TABS: 1.0000 | ORAL_TABLET | ORAL | Status: DC | PRN

## 2014-12-16 NOTE — Telephone Encounter (Signed)
Per Dr. Benay Spice and Ned Card, okay to refill percocet; pt's mother appreciative of call back and states she will pick prescription up for pt. I instructed her she must have a valid photo ID and she voices understanding.

## 2014-12-20 ENCOUNTER — Other Ambulatory Visit: Payer: Self-pay | Admitting: Oncology

## 2014-12-23 ENCOUNTER — Other Ambulatory Visit: Payer: Self-pay | Admitting: Nurse Practitioner

## 2014-12-23 ENCOUNTER — Ambulatory Visit: Payer: 59 | Admitting: Nutrition

## 2014-12-23 ENCOUNTER — Other Ambulatory Visit: Payer: Self-pay | Admitting: Radiology

## 2014-12-23 ENCOUNTER — Encounter: Payer: Self-pay | Admitting: *Deleted

## 2014-12-23 ENCOUNTER — Other Ambulatory Visit (HOSPITAL_BASED_OUTPATIENT_CLINIC_OR_DEPARTMENT_OTHER): Payer: 59

## 2014-12-23 ENCOUNTER — Ambulatory Visit (HOSPITAL_BASED_OUTPATIENT_CLINIC_OR_DEPARTMENT_OTHER): Payer: 59

## 2014-12-23 ENCOUNTER — Ambulatory Visit (HOSPITAL_BASED_OUTPATIENT_CLINIC_OR_DEPARTMENT_OTHER): Payer: 59 | Admitting: Nurse Practitioner

## 2014-12-23 VITALS — HR 100

## 2014-12-23 VITALS — BP 112/63 | HR 102 | Temp 98.3°F | Resp 18 | Ht 66.0 in | Wt 141.4 lb

## 2014-12-23 DIAGNOSIS — C169 Malignant neoplasm of stomach, unspecified: Secondary | ICD-10-CM | POA: Diagnosis not present

## 2014-12-23 DIAGNOSIS — K831 Obstruction of bile duct: Secondary | ICD-10-CM

## 2014-12-23 DIAGNOSIS — Z5111 Encounter for antineoplastic chemotherapy: Secondary | ICD-10-CM | POA: Diagnosis not present

## 2014-12-23 LAB — COMPREHENSIVE METABOLIC PANEL (CC13)
ALBUMIN: 2.7 g/dL — AB (ref 3.5–5.0)
ALK PHOS: 182 U/L — AB (ref 40–150)
ALT: 31 U/L (ref 0–55)
AST: 46 U/L — AB (ref 5–34)
Anion Gap: 7 mEq/L (ref 3–11)
BILIRUBIN TOTAL: 0.72 mg/dL (ref 0.20–1.20)
BUN: 10.5 mg/dL (ref 7.0–26.0)
CO2: 26 mEq/L (ref 22–29)
CREATININE: 0.9 mg/dL (ref 0.6–1.1)
Calcium: 9.2 mg/dL (ref 8.4–10.4)
Chloride: 104 mEq/L (ref 98–109)
EGFR: 87 mL/min/{1.73_m2} — ABNORMAL LOW (ref >90)
GLUCOSE: 103 mg/dL (ref 70–140)
Potassium: 4.5 mEq/L (ref 3.5–5.1)
SODIUM: 138 meq/L (ref 136–145)
TOTAL PROTEIN: 8.3 g/dL (ref 6.4–8.3)

## 2014-12-23 LAB — CBC WITH DIFFERENTIAL/PLATELET
BASO%: 0.3 % (ref 0.0–2.0)
Basophils Absolute: 0 10*3/uL (ref 0.0–0.1)
EOS ABS: 0.1 10*3/uL (ref 0.0–0.5)
EOS%: 1.5 % (ref 0.0–7.0)
HCT: 29.9 % — ABNORMAL LOW (ref 34.8–46.6)
HEMOGLOBIN: 9.7 g/dL — AB (ref 11.6–15.9)
LYMPH#: 1.4 10*3/uL (ref 0.9–3.3)
LYMPH%: 17.6 % (ref 14.0–49.7)
MCH: 25.9 pg (ref 25.1–34.0)
MCHC: 32.4 g/dL (ref 31.5–36.0)
MCV: 80 fL (ref 79.5–101.0)
MONO#: 1.1 10*3/uL — ABNORMAL HIGH (ref 0.1–0.9)
MONO%: 14.1 % — AB (ref 0.0–14.0)
NEUT%: 66.5 % (ref 38.4–76.8)
NEUTROS ABS: 5.2 10*3/uL (ref 1.5–6.5)
Platelets: 158 10*3/uL (ref 145–400)
RBC: 3.73 10*6/uL (ref 3.70–5.45)
RDW: 17.6 % — AB (ref 11.2–14.5)
WBC: 7.8 10*3/uL (ref 3.9–10.3)

## 2014-12-23 LAB — TECHNOLOGIST REVIEW

## 2014-12-23 MED ORDER — MORPHINE SULFATE ER 15 MG PO TBCR: 15.0000 mg | EXTENDED_RELEASE_TABLET | Freq: Two times a day (BID) | ORAL | Status: DC

## 2014-12-23 MED ORDER — SODIUM CHLORIDE 0.9 % IV SOLN
1800.0000 mg/m2 | INTRAVENOUS | Status: DC
  Administered 2014-12-23: 3050 mg via INTRAVENOUS
  Filled 2014-12-23: qty 61

## 2014-12-23 MED ORDER — DEXTROSE 5 % IV SOLN
Freq: Once | INTRAVENOUS | Status: AC
  Administered 2014-12-23: 12:00:00 via INTRAVENOUS

## 2014-12-23 MED ORDER — FLUOROURACIL CHEMO INJECTION 500 MG/10ML
300.0000 mg/m2 | Freq: Once | INTRAVENOUS | Status: AC
  Administered 2014-12-23: 500 mg via INTRAVENOUS
  Filled 2014-12-23: qty 10

## 2014-12-23 MED ORDER — LEUCOVORIN CALCIUM INJECTION 350 MG
300.0000 mg/m2 | Freq: Once | INTRAMUSCULAR | Status: AC
  Administered 2014-12-23: 510 mg via INTRAVENOUS
  Filled 2014-12-23: qty 25.5

## 2014-12-23 MED ORDER — OXALIPLATIN CHEMO INJECTION 100 MG/20ML
85.0000 mg/m2 | Freq: Once | INTRAVENOUS | Status: AC
  Administered 2014-12-23: 145 mg via INTRAVENOUS
  Filled 2014-12-23: qty 20

## 2014-12-23 MED ORDER — PALONOSETRON HCL INJECTION 0.25 MG/5ML
0.2500 mg | Freq: Once | INTRAVENOUS | Status: AC
  Administered 2014-12-23: 0.25 mg via INTRAVENOUS

## 2014-12-23 MED ORDER — LORAZEPAM 0.5 MG PO TABS: 0.5000 mg | ORAL_TABLET | Freq: Three times a day (TID) | ORAL | Status: DC | PRN

## 2014-12-23 MED ORDER — SODIUM CHLORIDE 0.9 % IV SOLN
Freq: Once | INTRAVENOUS | Status: AC
  Administered 2014-12-23: 13:00:00 via INTRAVENOUS
  Filled 2014-12-23: qty 5

## 2014-12-23 MED ORDER — PALONOSETRON HCL INJECTION 0.25 MG/5ML
INTRAVENOUS | Status: AC
  Filled 2014-12-23: qty 5

## 2014-12-23 MED ORDER — DEXAMETHASONE 4 MG PO TABS: 8.0000 mg | ORAL_TABLET | Freq: Two times a day (BID) | ORAL | Status: DC

## 2014-12-23 NOTE — Progress Notes (Signed)
Nutrition follow-up completed with patient during infusion. Patient being treated for gastric cancer and is status post subtotal gastrectomy. Patient reports she is tolerating TNA and some small amount of oral intake. Patient reports appetite is poor but she is trying to eat. She has not tried any of the juice-based oral nutrition supplements. Weight improved and documented as 141.4 pounds September 28 increased from 136.2 pounds September 7. Patient denies dumping syndrome.  Nutrition diagnosis: Inadequate oral intake continues.  Intervention:  Recommended patient continue to try to eat small amounts throughout the day. Educated patient on signs and symptoms of dumping syndrome strategies for avoiding Consider changing TNA to a cyclic cycle which may improve patient appetite. Teach back method used.  Monitoring, evaluation, goals: Patient will work to increase oral intake to promote weight stabilization.  Next visit: Wednesday, October 26 during infusion.  **Disclaimer: This note was dictated with voice recognition software. Similar sounding words can inadvertently be transcribed and this note may contain transcription errors which may not have been corrected upon publication of note.**

## 2014-12-23 NOTE — Progress Notes (Signed)
Blood return noted before and after Adrucil push.  

## 2014-12-23 NOTE — Telephone Encounter (Signed)
Called in to pharmacy to refill prescription for Ativan 0.5mg 

## 2014-12-23 NOTE — Progress Notes (Signed)
Oncology Nurse Navigator Documentation  Oncology Nurse Navigator Flowsheets 12/23/2014  Navigator Encounter Type Treatment  Patient Visit Type Medonc  Treatment Phase Treatment-FOLFOX #2  Barriers/Navigation Needs Family concerns--bilary drain follow up  Interventions Coordination of Care  Coordination of Care Radiology--Dr. Laurence Ferrari will see her 9/29 at 0830  Time Spent with Patient 15  Provided [facility] with appointment information. NPO after MN and needs driver. Denies any other needs at this time. TPN going well at home with [facility].

## 2014-12-23 NOTE — Telephone Encounter (Signed)
As per Owens Shark called pharmacy to refill prescription for ativan

## 2014-12-23 NOTE — Progress Notes (Addendum)
[facility] OFFICE PROGRESS NOTE   Diagnosis:  Gastric cancer  INTERVAL HISTORY:   Andrea Best returns as scheduled. She completed cycle 1 FOLFOX 12/09/2014. She had nausea beginning day 1 and lasting for 3-4 days. Zofran was partially effective. No mouth sores. She did note a white coating over her tongue which has resolved. No diarrhea. She did not experience cold sensitivity. She continues TPN. She continues to have a poor appetite. She reports adequate fluid intake. Abdominal pain is unchanged. She is taking Percocet every 4 hours.  Objective:  Vital signs in last 24 hours:  Blood pressure 112/63, pulse 102, temperature 98.3 F (36.8 C), temperature source Oral, resp. rate 18, height 5' 6"  (1.676 m), weight 141 lb 6.4 oz (64.139 kg), SpO2 100 %.    HEENT: No thrush or ulcers. Resp: Lungs clear bilaterally. Cardio: Regular rate and rhythm. GI: No hepatomegaly. Percutaneous biliary drain right abdomen. Vascular: No leg edema. Port-A-Cath without erythema.    Lab Results:  Lab Results  Component Value Date   WBC 7.8 12/23/2014   HGB 9.7* 12/23/2014   HCT 29.9* 12/23/2014   MCV 80.0 12/23/2014   PLT 158 12/23/2014   NEUTROABS 5.2 12/23/2014    Imaging:  No results found.  Medications: I have reviewed the patient's current medications.  Assessment/Plan: 1. Gastric cancer status post upper endoscopy 09/22/2013 with findings of a partially obstructing oozing cratered gastric ulcer in the gastric antrum.  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   CT scans chest/abdomen/pelvis 10/03/2013 showed a 7 x 5 mm groundglass opacity in the superior segment of the right lower lobe; a 4.5 mm nodular density in the right upper lobe; bilateral axillary adenopathy; low density area anteriorly in the left hepatic lobe most consistent with fatty infiltration; another hypodense area within the medial segment of the left hepatic  lobe; severe gastric distention; mild bilateral inguinal adenopathy.   Subtotal gastrectomy and lymph node dissection with creation of a gastrojejunostomy 10/10/2013. No evidence of distant metastatic disease noted at the time of surgery. No evidence of liver metastases. 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 Tumor involved the serosal surface. Resection margins were negative. 10 of 17 lymph nodes were positive for metastatic adenocarcinoma. Her-2 not amplified   Cycle 1 weekly 5-FU/leucovorin 11/14/2013, dose reduced beginning with week 3 secondary to mucositis and hand/foot syndrome.   Cycle 2 weekly 5-FU/leucovorin beginning 12/19/2013.  Initiation of radiation/Xeloda 01/26/2014. She decided to discontinue further radiation/Xeloda after one fraction due to significant nausea/vomiting.  Cycle 3 weekly 5-FU/leucovorin beginning 02/13/2014  Cycle 4 weekly 5-FU/leucovorin beginning 03/18/2014  Cycle 5 weekly 5-FU/leucovorin beginning 04/28/2014  CTs 06/16/2014 with new enhancing nodular lesions along the ventral peritoneal surface  CT-guided biopsy of an anterior peritoneal lesion on 06/24/2014 confirmed poorly differentiated adenocarcinoma  CTs 09/03/2014 with an increasing confluent enhancing lesion located at the ventral peritoneal surface midline of the anterior abdomen. Lesion inseparable from the posterior aspect of the abdominal wall.  CT abdomen/pelvis 11/18/2014 with significant progression of metastatic disease with enlarging metastasis within the anterior abdominal wall, enlargement of multiple peritoneal implants, interval development of masses within the pancreatic head and caudate lobe of the liver. 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 New complex solid and cystic left adnexal lesion.  Placement of percutaneous internal/external biliary drain  11/19/2014  Cycle 1 FOLFOX 12/09/2014  Cycle 2 FOLFOX 12/23/2014 2. Gastric outlet obstruction secondary to #1.  Resolved. 3. Nausea/vomiting secondary to #2. Resolved. 4. Abdominal pain secondary to #1. 5. History of weight loss. 6. Microcytic anemia, likely iron deficiency. She received iron dextran 10/11/2013. 7. Lupus/Sjgren's. 8. Pneumonia during hospitalization July 2015.  9. Abdominal abscesses with body fluid culture 10/20/2013 showing moderate Candida tropicalis. She was discharged home on a 4 week course of fluconazole.  10. Mucositis and hand/foot syndrome following cycle 1-week #3 5-FU/leucovorin, the 5-FU and leucovorin were dose reduced. 11. 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 12. Status post LEEP 11/07/2013. 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 Pap smear 06/08/2014-negative for intraepithelial lesions or malignancy     Disposition: Andrea Best appears stable. She has completed 1 cycle of FOLFOX. Plan to proceed with cycle 2 today as scheduled.  She had significant nausea following cycle 1. She will receive Aloxi and Emend today. She will begin dexamethasone 8 mg twice a day for 2 days on 12/24/2014. A prescription was sent to her pharmacy for Ativan 0.5 mg every 8 hours as needed for nausea. She understands to contact the office if she has nausea despite these changes.  She continues to have abdominal pain and is taking frequent Percocet. She will begin MS Contin 15 mg every 12 hours.  She continues to have a percutaneous biliary drain. We will contact interventional radiology to arrange for follow-up in the drain clinic.  She will return for a follow-up visit and cycle 3 FOLFOX in 2 weeks. She will contact the office in the interim as outlined above or with any other problems.  Patient seen with Dr. Benay Spice. 25 minutes were spent face-to-face at today's visit with the majority of that time  involved in counseling/coordination of care.    Ned Card ANP/GNP-BC   12/23/2014  11:24 AM  This was a shared visit with Ned Card. Andrea Best was interviewed and examined. We adjusted the anti-emetic regimen today. She will complete cycle 2 FOLFOX today. We will refer her to interventional radiology for management of the biliary drain.  We adjusted the narcotic pain regimen.  Julieanne Manson, M.D.

## 2014-12-23 NOTE — Patient Instructions (Signed)
Cancer Center Discharge Instructions for Patients Receiving Chemotherapy  Today you received the following chemotherapy agents: Oxaliplatin, Leucovorin, and Adrucil   To help prevent nausea and vomiting after your treatment, we encourage you to take your nausea medication as directed.    If you develop nausea and vomiting that is not controlled by your nausea medication, call the clinic.   BELOW ARE SYMPTOMS THAT SHOULD BE REPORTED IMMEDIATELY:  *FEVER GREATER THAN 100.5 F  *CHILLS WITH OR WITHOUT FEVER  NAUSEA AND VOMITING THAT IS NOT CONTROLLED WITH YOUR NAUSEA MEDICATION  *UNUSUAL SHORTNESS OF BREATH  *UNUSUAL BRUISING OR BLEEDING  TENDERNESS IN MOUTH AND THROAT WITH OR WITHOUT PRESENCE OF ULCERS  *URINARY PROBLEMS  *BOWEL PROBLEMS  UNUSUAL RASH Items with * indicate a potential emergency and should be followed up as soon as possible.  Feel free to call the clinic you have any questions or concerns. The clinic phone number is [phone].  Please show the CHEMO ALERT CARD at check-in to the Emergency Department and triage nurse.   

## 2014-12-24 ENCOUNTER — Ambulatory Visit (HOSPITAL_COMMUNITY)
Admission: RE | Admit: 2014-12-24 | Discharge: 2014-12-24 | Disposition: A | Discharge status: Home / Self Care | Payer: 59 | Source: Ambulatory Visit | Attending: Interventional Radiology | Admitting: Interventional Radiology

## 2014-12-24 ENCOUNTER — Ambulatory Visit (HOSPITAL_COMMUNITY)
Admission: RE | Admit: 2014-12-24 | Discharge: 2014-12-24 | Disposition: A | Discharge status: Home / Self Care | Payer: 59 | Source: Ambulatory Visit | Attending: Radiology | Admitting: Radiology

## 2014-12-24 ENCOUNTER — Encounter (HOSPITAL_COMMUNITY): Payer: Self-pay

## 2014-12-24 DIAGNOSIS — K831 Obstruction of bile duct: Secondary | ICD-10-CM | POA: Diagnosis not present

## 2014-12-24 DIAGNOSIS — C169 Malignant neoplasm of stomach, unspecified: Secondary | ICD-10-CM | POA: Insufficient documentation

## 2014-12-24 DIAGNOSIS — Z4682 Encounter for fitting and adjustment of non-vascular catheter: Secondary | ICD-10-CM | POA: Insufficient documentation

## 2014-12-24 LAB — CBC WITH DIFFERENTIAL/PLATELET
BASOS PCT: 0 %
Basophils Absolute: 0 10*3/uL (ref 0.0–0.1)
EOS PCT: 0 %
Eosinophils Absolute: 0 10*3/uL (ref 0.0–0.7)
HEMATOCRIT: 29 % — AB (ref 36.0–46.0)
Hemoglobin: 9.7 g/dL — ABNORMAL LOW (ref 12.0–15.0)
LYMPHS ABS: 1.6 10*3/uL (ref 0.7–4.0)
Lymphocytes Relative: 10 %
MCH: 26.7 pg (ref 26.0–34.0)
MCHC: 33.4 g/dL (ref 30.0–36.0)
MCV: 79.9 fL (ref 78.0–100.0)
MONO ABS: 1.4 10*3/uL — AB (ref 0.1–1.0)
Monocytes Relative: 9 %
Neutro Abs: 13 10*3/uL — ABNORMAL HIGH (ref 1.7–7.7)
Neutrophils Relative %: 81 %
PLATELETS: 227 10*3/uL (ref 150–400)
RBC: 3.63 MIL/uL — ABNORMAL LOW (ref 3.87–5.11)
RDW: 16.4 % — AB (ref 11.5–15.5)
WBC: 16 10*3/uL — ABNORMAL HIGH (ref 4.0–10.5)

## 2014-12-24 LAB — COMPREHENSIVE METABOLIC PANEL
ALBUMIN: 3.2 g/dL — AB (ref 3.5–5.0)
ALT: 32 U/L (ref 14–54)
ANION GAP: 8 (ref 5–15)
AST: 47 U/L — AB (ref 15–41)
Alkaline Phosphatase: 174 U/L — ABNORMAL HIGH (ref 38–126)
BILIRUBIN TOTAL: 0.7 mg/dL (ref 0.3–1.2)
BUN: 15 mg/dL (ref 6–20)
CHLORIDE: 100 mmol/L — AB (ref 101–111)
CO2: 25 mmol/L (ref 22–32)
Calcium: 9.5 mg/dL (ref 8.9–10.3)
Creatinine, Ser: 0.9 mg/dL (ref 0.44–1.00)
GFR calc Af Amer: >60 mL/min (ref >60)
GFR calc non Af Amer: >60 mL/min (ref >60)
GLUCOSE: 107 mg/dL — AB (ref 65–99)
POTASSIUM: 4.2 mmol/L (ref 3.5–5.1)
SODIUM: 133 mmol/L — AB (ref 135–145)
TOTAL PROTEIN: 9.4 g/dL — AB (ref 6.5–8.1)

## 2014-12-24 LAB — PROTIME-INR
INR: 1.18 (ref 0.00–1.49)
Prothrombin Time: 15.1 seconds (ref 11.6–15.2)

## 2014-12-24 MED ORDER — FENTANYL CITRATE (PF) 100 MCG/2ML IJ SOLN
INTRAMUSCULAR | Status: AC | PRN
  Administered 2014-12-24: 25 ug via INTRAVENOUS
  Administered 2014-12-24: 50 ug via INTRAVENOUS

## 2014-12-24 MED ORDER — IOHEXOL 300 MG/ML  SOLN
10.0000 mL | Freq: Once | INTRAMUSCULAR | Status: DC | PRN
  Administered 2014-12-24: 10 mL
  Filled 2014-12-24: qty 10

## 2014-12-24 MED ORDER — FENTANYL CITRATE (PF) 100 MCG/2ML IJ SOLN
INTRAMUSCULAR | Status: AC
  Filled 2014-12-24: qty 4

## 2014-12-24 MED ORDER — SODIUM CHLORIDE 0.9 % IV SOLN
INTRAVENOUS | Status: DC
  Administered 2014-12-24: 09:00:00 via INTRAVENOUS

## 2014-12-24 MED ORDER — LIDOCAINE HCL 1 % IJ SOLN
INTRAMUSCULAR | Status: AC
  Filled 2014-12-24: qty 20

## 2014-12-24 MED ORDER — PIPERACILLIN-TAZOBACTAM 3.375 G IVPB
3.3750 g | Freq: Once | INTRAVENOUS | Status: AC
  Administered 2014-12-24: 3.375 g via INTRAVENOUS
  Filled 2014-12-24: qty 50

## 2014-12-24 MED ORDER — MIDAZOLAM HCL 2 MG/2ML IJ SOLN
INTRAMUSCULAR | Status: AC
  Filled 2014-12-24: qty 4

## 2014-12-24 MED ORDER — MIDAZOLAM HCL 2 MG/2ML IJ SOLN
INTRAMUSCULAR | Status: AC | PRN
  Administered 2014-12-24: 1 mg via INTRAVENOUS

## 2014-12-24 NOTE — Discharge Instructions (Signed)
Call [facility] for any problems or questions. Tube Care, dressing and care same as before. No change in medications or diet. No driving x 24 hours.

## 2014-12-24 NOTE — Progress Notes (Signed)
Patient arrived to short stay with 5FU infusing via pump to porta cath.

## 2014-12-24 NOTE — H&P (Signed)
HPI: Patient with gastric cancer s/p resection, now with recurrence and biliary obstruction s/p internal external biliary drain placed 11/19/14 draining 1 liter/day.   She is s/p cholangiogram 11/26/14 with slow internal drainage and decision not to cap at that time Scheduled today for image guided cholangiogram with biliary drain exchange/upsize  The patient has had a H&P performed within the last 30 days, all history, medications, and exam have been reviewed. The patient denies any interval changes since the H&P.  Medications: Prior to Admission medications   Medication Sig Start Date End Date Taking? Authorizing Provider  ALOE VERA JUICE PO Take 30 mLs by mouth daily.    Historical Provider, MD  cephALEXin (KEFLEX) 500 MG capsule Take 1 capsule (500 mg total) by mouth 2 (two) times daily. 11/22/14   Linton Flemings, MD  CHLOROPHYLL-ALFALFA PO Take 5 mLs by mouth daily.    Historical Provider, MD  dexamethasone (DECADRON) 4 MG tablet Take 2 tablets (8 mg total) by mouth 2 (two) times daily. Take 8 mg twice a day for two days beginning the day after chemotherapy. 12/23/14   Owens Shark, NP  ferrous sulfate 325 (65 FE) MG tablet Take 325 mg by mouth 2 (two) times daily with a meal.    Historical Provider, MD  hydroxychloroquine (PLAQUENIL) 200 MG tablet Take 1 tablet (200 mg total) by mouth 2 (two) times daily. 09/17/14   Ladell Pier, MD  ibuprofen (ADVIL,MOTRIN) 200 MG tablet Take 200-600 mg by mouth every 6 (six) hours as needed for headache or moderate pain.    Historical Provider, MD  lidocaine-prilocaine (EMLA) cream Appy small amount of cream over port site 1-2 hours prior to treatment and cover with plastic wrap.  DO NOT RUB IN Patient taking differently: Apply 1 application topically daily as needed. Appy small amount of cream over port site 1-2 hours prior to treatment and cover with plastic wrap.  DO NOT RUB IN 03/18/14   Owens Shark, NP  LORazepam (ATIVAN) 0.5 MG tablet Take 1 tablet  (0.5 mg total) by mouth every 8 (eight) hours as needed (nausea). May take oral or sublingual every 8 hours as needed for nausea 12/23/14   Owens Shark, NP  morphine (MS CONTIN) 15 MG 12 hr tablet Take 1 tablet (15 mg total) by mouth every 12 (twelve) hours. 12/23/14   Owens Shark, NP  ondansetron (ZOFRAN) 4 MG tablet Take 1 tablet (4 mg total) by mouth every 8 (eight) hours as needed for nausea or vomiting. 12/02/14   Owens Shark, NP  OVER THE COUNTER MEDICATION Take 30 mLs by mouth daily. [place] go tea supplyment    Historical Provider, MD  oxyCODONE-acetaminophen (PERCOCET/ROXICET) 5-325 MG per tablet Take 1 tablet by mouth every 4 (four) hours as needed for severe pain. 12/16/14   Owens Shark, NP     Vital Signs: BP 119/59 mmHg  Pulse 102  Temp(Src) 99 F (37.2 C) (Oral)  Resp 16  SpO2 98%  LMP 11/23/2014 (Approximate)  Physical Exam  Constitutional: She is oriented to person, place, and time. No distress.  HENT:  Head: Normocephalic and atraumatic.  Neck: No tracheal deviation present.  Cardiovascular: Exam reveals no gallop and no friction rub.   No murmur heard. Tachycardic  Pulmonary/Chest: Effort normal and breath sounds normal. No respiratory distress. She has no wheezes. She has no rales.  Abdominal: Soft. Bowel sounds are normal. She exhibits no distension. There is no tenderness.  Biliary drain  intact with 80 cc bilious output, NT  Neurological: She is alert and oriented to person, place, and time.  Skin: She is not diaphoretic.    Mallampati Score:  MD Evaluation Airway: WNL Heart: WNL Abdomen: WNL Chest/ Lungs: WNL ASA  Classification: 3 Mallampati/Airway Score: One  Labs:  CBC:  Recent Labs  11/21/14 2312 12/02/14 1112 12/09/14 1324 12/23/14 1018  WBC 12.8* 9.2 8.3 7.8  HGB 10.7* 10.6* 9.9* 9.7*  HCT 33.2* 31.7* 30.7* 29.9*  PLT 192 331 286 158    COAGS:  Recent Labs  06/24/14 1142 11/19/14 0714 12/24/14 0905  INR 1.04 1.15 1.18  APTT  30  --   --     BMP:  Recent Labs  11/21/14 2312  11/26/14 1415 12/02/14 1112 12/09/14 1324 12/23/14 1018  NA 129*  < > 134* 135* 134* 138  K 3.7  < > 3.9 3.4* 4.7 4.5  CL 98*  --  96*  --   --   --   CO2 21*  < > 25 26 28 26   GLUCOSE 127*  < > 107* 102 110 103  BUN 8  < > 8 8.8 8.5 10.5  CALCIUM 8.8*  < > 8.6 9.2 9.4 9.2  CREATININE 0.84  < > 0.87 1.0 0.9 0.9  GFRNONAA >60  --  83  --   --   --   GFRAA >60  --  >89  --   --   --   < > = values in this interval not displayed.  LIVER FUNCTION TESTS:  Recent Labs  11/26/14 1415 12/02/14 1112 12/09/14 1324 12/23/14 1018  BILITOT 1.1 0.77 0.79 0.72  AST 23 23 20  46*  ALT 31* 16 10 31   ALKPHOS 212* 173* 143 182*  PROT 8.1 8.9* 8.5* 8.3  ALBUMIN 2.9* 2.5* 2.5* 2.7*    Assessment/Plan:  Gastric cancer s/p resection, now with recurrence  Biliary obstruction s/p internal external biliary drain placed 11/19/14 draining 1 liter/day S/p cholangiogram 11/26/14 with slow internal drainage and decision not to cap at that time Scheduled today for image guided cholangiogram with biliary drain exchange/upsize with sedation to allow for better internal drainage that may allow for capping. The patient has been NPO, no blood thinners taken, labs and vitals have been reviewed. Risks and Benefits discussed with the patient including, but not limited to bleeding or infection  All of the patient's questions were answered, patient is agreeable to proceed. Consent signed and in chart.   SignedHedy Jacob 12/24/2014, 9:39 AM

## 2014-12-25 ENCOUNTER — Ambulatory Visit: Payer: 59

## 2014-12-25 ENCOUNTER — Ambulatory Visit (HOSPITAL_BASED_OUTPATIENT_CLINIC_OR_DEPARTMENT_OTHER): Payer: 59

## 2014-12-25 VITALS — BP 107/63 | HR 100 | Temp 99.8°F | Resp 18

## 2014-12-25 DIAGNOSIS — Z5189 Encounter for other specified aftercare: Secondary | ICD-10-CM

## 2014-12-25 DIAGNOSIS — C169 Malignant neoplasm of stomach, unspecified: Secondary | ICD-10-CM | POA: Diagnosis not present

## 2014-12-25 MED ORDER — HEPARIN SOD (PORK) LOCK FLUSH 100 UNIT/ML IV SOLN
500.0000 [IU] | Freq: Once | INTRAVENOUS | Status: AC | PRN
  Administered 2014-12-25: 500 [IU]
  Filled 2014-12-25: qty 5

## 2014-12-25 MED ORDER — SODIUM CHLORIDE 0.9 % IJ SOLN
10.0000 mL | INTRAMUSCULAR | Status: DC | PRN
  Administered 2014-12-25: 10 mL
  Filled 2014-12-25: qty 10

## 2014-12-25 MED ORDER — PEGFILGRASTIM INJECTION 6 MG/0.6ML ~~LOC~~
6.0000 mg | PREFILLED_SYRINGE | Freq: Once | SUBCUTANEOUS | Status: AC
  Administered 2014-12-25: 6 mg via SUBCUTANEOUS
  Filled 2014-12-25: qty 0.6

## 2014-12-25 NOTE — Progress Notes (Signed)
Neulasta injection given by flush nurse 

## 2014-12-25 NOTE — Patient Instructions (Signed)
Pegfilgrastim injection What is this medicine? PEGFILGRASTIM (peg fil GRA stim) is a long-acting granulocyte colony-stimulating factor that stimulates the growth of neutrophils, a type of white blood cell important in the body's fight against infection. It is used to reduce the incidence of fever and infection in patients with certain types of cancer who are receiving chemotherapy that affects the bone marrow. This medicine may be used for other purposes; ask your health care provider or pharmacist if you have questions. COMMON BRAND NAME(S): Neulasta What should I tell my health care provider before I take this medicine? They need to know if you have any of these conditions: -latex allergy -ongoing radiation therapy -sickle cell disease -skin reactions to acrylic adhesives (On-Body Injector only) -an unusual or allergic reaction to pegfilgrastim, filgrastim, other medicines, foods, dyes, or preservatives -pregnant or trying to get pregnant -breast-feeding How should I use this medicine? This medicine is for injection under the skin. If you get this medicine at home, you will be taught how to prepare and give the pre-filled syringe or how to use the On-body Injector. Refer to the patient Instructions for Use for detailed instructions. Use exactly as directed. Take your medicine at regular intervals. Do not take your medicine more often than directed. It is important that you put your used needles and syringes in a special sharps container. Do not put them in a trash can. If you do not have a sharps container, call your pharmacist or healthcare provider to get one. Talk to your pediatrician regarding the use of this medicine in children. Special care may be needed. Overdosage: If you think you have taken too much of this medicine contact a poison control center or emergency room at once. NOTE: This medicine is only for you. Do not share this medicine with others. What if I miss a dose? It is  important not to miss your dose. Call your doctor or health care professional if you miss your dose. If you miss a dose due to an On-body Injector failure or leakage, a new dose should be administered as soon as possible using a single prefilled syringe for manual use. What may interact with this medicine? Interactions have not been studied. Give your health care provider a list of all the medicines, herbs, non-prescription drugs, or dietary supplements you use. Also tell them if you smoke, drink alcohol, or use illegal drugs. Some items may interact with your medicine. This list may not describe all possible interactions. Give your health care provider a list of all the medicines, herbs, non-prescription drugs, or dietary supplements you use. Also tell them if you smoke, drink alcohol, or use illegal drugs. Some items may interact with your medicine. What should I watch for while using this medicine? You may need blood work done while you are taking this medicine. If you are going to need a MRI, CT scan, or other procedure, tell your doctor that you are using this medicine (On-Body Injector only). What side effects may I notice from receiving this medicine? Side effects that you should report to your doctor or health care professional as soon as possible: -allergic reactions like skin rash, itching or hives, swelling of the face, lips, or tongue -dizziness -fever -pain, redness, or irritation at site where injected -pinpoint red spots on the skin -shortness of breath or breathing problems -stomach or side pain, or pain at the shoulder -swelling -tiredness -trouble passing urine Side effects that usually do not require medical attention (report to your doctor   or health care professional if they continue or are bothersome): -bone pain -muscle pain This list may not describe all possible side effects. Call your doctor for medical advice about side effects. You may report side effects to FDA at  1-800-FDA-1088. Where should I keep my medicine? Keep out of the reach of children. Store pre-filled syringes in a refrigerator between 2 and 8 degrees C (36 and 46 degrees F). Do not freeze. Keep in carton to protect from light. Throw away this medicine if it is left out of the refrigerator for more than 48 hours. Throw away any unused medicine after the expiration date. NOTE: This sheet is a summary. It may not cover all possible information. If you have questions about this medicine, talk to your doctor, pharmacist, or health care provider.  2015, Elsevier/Gold Standard. (2013-06-12 16:14:05)  

## 2014-12-29 ENCOUNTER — Telehealth: Payer: Self-pay | Admitting: *Deleted

## 2014-12-29 NOTE — Telephone Encounter (Signed)
"  I received a message from Amy and returning a call.  Return number [phone]."  Voicemail forwarded to [phone].

## 2014-12-31 ENCOUNTER — Other Ambulatory Visit: Payer: Self-pay | Admitting: Radiology

## 2014-12-31 DIAGNOSIS — K831 Obstruction of bile duct: Secondary | ICD-10-CM

## 2015-01-03 ENCOUNTER — Other Ambulatory Visit: Payer: Self-pay | Admitting: Oncology

## 2015-01-05 ENCOUNTER — Other Ambulatory Visit: Payer: Self-pay | Admitting: *Deleted

## 2015-01-05 DIAGNOSIS — C169 Malignant neoplasm of stomach, unspecified: Secondary | ICD-10-CM

## 2015-01-06 ENCOUNTER — Other Ambulatory Visit (HOSPITAL_BASED_OUTPATIENT_CLINIC_OR_DEPARTMENT_OTHER): Payer: 59

## 2015-01-06 ENCOUNTER — Emergency Department (HOSPITAL_COMMUNITY): Payer: 59

## 2015-01-06 ENCOUNTER — Inpatient Hospital Stay (HOSPITAL_COMMUNITY)
Admission: EM | Admit: 2015-01-06 | Discharge: 2015-01-11 | DRG: 871 | Disposition: A | Discharge status: Home / Self Care | Payer: 59 | Attending: Internal Medicine | Admitting: Internal Medicine

## 2015-01-06 ENCOUNTER — Other Ambulatory Visit: Payer: Self-pay

## 2015-01-06 ENCOUNTER — Ambulatory Visit: Payer: 59

## 2015-01-06 ENCOUNTER — Ambulatory Visit (HOSPITAL_BASED_OUTPATIENT_CLINIC_OR_DEPARTMENT_OTHER): Payer: 59 | Admitting: Oncology

## 2015-01-06 ENCOUNTER — Encounter (HOSPITAL_COMMUNITY): Payer: Self-pay | Admitting: Emergency Medicine

## 2015-01-06 VITALS — BP 80/50 | HR 127 | Temp 100.5°F | Resp 30 | Ht 66.0 in

## 2015-01-06 DIAGNOSIS — Z6822 Body mass index (BMI) 22.0-22.9, adult: Secondary | ICD-10-CM | POA: Diagnosis not present

## 2015-01-06 DIAGNOSIS — Z7952 Long term (current) use of systemic steroids: Secondary | ICD-10-CM

## 2015-01-06 DIAGNOSIS — D509 Iron deficiency anemia, unspecified: Secondary | ICD-10-CM | POA: Diagnosis present

## 2015-01-06 DIAGNOSIS — N179 Acute kidney failure, unspecified: Secondary | ICD-10-CM | POA: Diagnosis present

## 2015-01-06 DIAGNOSIS — C169 Malignant neoplasm of stomach, unspecified: Secondary | ICD-10-CM | POA: Diagnosis not present

## 2015-01-06 DIAGNOSIS — R509 Fever, unspecified: Secondary | ICD-10-CM | POA: Diagnosis present

## 2015-01-06 DIAGNOSIS — Z881 Allergy status to other antibiotic agents status: Secondary | ICD-10-CM

## 2015-01-06 DIAGNOSIS — D709 Neutropenia, unspecified: Secondary | ICD-10-CM | POA: Diagnosis present

## 2015-01-06 DIAGNOSIS — E273 Drug-induced adrenocortical insufficiency: Secondary | ICD-10-CM | POA: Diagnosis present

## 2015-01-06 DIAGNOSIS — Z833 Family history of diabetes mellitus: Secondary | ICD-10-CM

## 2015-01-06 DIAGNOSIS — Z8711 Personal history of peptic ulcer disease: Secondary | ICD-10-CM | POA: Diagnosis not present

## 2015-01-06 DIAGNOSIS — I959 Hypotension, unspecified: Secondary | ICD-10-CM

## 2015-01-06 DIAGNOSIS — Z903 Acquired absence of stomach [part of]: Secondary | ICD-10-CM

## 2015-01-06 DIAGNOSIS — A4159 Other Gram-negative sepsis: Secondary | ICD-10-CM | POA: Diagnosis not present

## 2015-01-06 DIAGNOSIS — C786 Secondary malignant neoplasm of retroperitoneum and peritoneum: Secondary | ICD-10-CM | POA: Diagnosis present

## 2015-01-06 DIAGNOSIS — Z882 Allergy status to sulfonamides status: Secondary | ICD-10-CM

## 2015-01-06 DIAGNOSIS — M549 Dorsalgia, unspecified: Secondary | ICD-10-CM | POA: Diagnosis present

## 2015-01-06 DIAGNOSIS — J45909 Unspecified asthma, uncomplicated: Secondary | ICD-10-CM | POA: Diagnosis present

## 2015-01-06 DIAGNOSIS — K311 Adult hypertrophic pyloric stenosis: Secondary | ICD-10-CM | POA: Diagnosis present

## 2015-01-06 DIAGNOSIS — D696 Thrombocytopenia, unspecified: Secondary | ICD-10-CM | POA: Diagnosis present

## 2015-01-06 DIAGNOSIS — A419 Sepsis, unspecified organism: Secondary | ICD-10-CM | POA: Diagnosis present

## 2015-01-06 DIAGNOSIS — Z888 Allergy status to other drugs, medicaments and biological substances status: Secondary | ICD-10-CM | POA: Diagnosis not present

## 2015-01-06 DIAGNOSIS — M3500 Sicca syndrome, unspecified: Secondary | ICD-10-CM | POA: Diagnosis present

## 2015-01-06 DIAGNOSIS — Z885 Allergy status to narcotic agent status: Secondary | ICD-10-CM

## 2015-01-06 DIAGNOSIS — E869 Volume depletion, unspecified: Secondary | ICD-10-CM | POA: Diagnosis present

## 2015-01-06 DIAGNOSIS — D638 Anemia in other chronic diseases classified elsewhere: Secondary | ICD-10-CM | POA: Diagnosis present

## 2015-01-06 DIAGNOSIS — E876 Hypokalemia: Secondary | ICD-10-CM | POA: Diagnosis present

## 2015-01-06 DIAGNOSIS — Z79891 Long term (current) use of opiate analgesic: Secondary | ICD-10-CM

## 2015-01-06 DIAGNOSIS — Z8701 Personal history of pneumonia (recurrent): Secondary | ICD-10-CM

## 2015-01-06 DIAGNOSIS — K219 Gastro-esophageal reflux disease without esophagitis: Secondary | ICD-10-CM | POA: Diagnosis present

## 2015-01-06 DIAGNOSIS — E440 Moderate protein-calorie malnutrition: Secondary | ICD-10-CM | POA: Diagnosis present

## 2015-01-06 DIAGNOSIS — D649 Anemia, unspecified: Secondary | ICD-10-CM | POA: Diagnosis present

## 2015-01-06 DIAGNOSIS — R6521 Severe sepsis with septic shock: Secondary | ICD-10-CM | POA: Diagnosis present

## 2015-01-06 DIAGNOSIS — N1330 Unspecified hydronephrosis: Secondary | ICD-10-CM | POA: Diagnosis present

## 2015-01-06 DIAGNOSIS — R531 Weakness: Secondary | ICD-10-CM

## 2015-01-06 DIAGNOSIS — Z8249 Family history of ischemic heart disease and other diseases of the circulatory system: Secondary | ICD-10-CM

## 2015-01-06 DIAGNOSIS — M329 Systemic lupus erythematosus, unspecified: Secondary | ICD-10-CM | POA: Diagnosis present

## 2015-01-06 DIAGNOSIS — C787 Secondary malignant neoplasm of liver and intrahepatic bile duct: Secondary | ICD-10-CM | POA: Diagnosis present

## 2015-01-06 DIAGNOSIS — K831 Obstruction of bile duct: Secondary | ICD-10-CM | POA: Insufficient documentation

## 2015-01-06 DIAGNOSIS — A4151 Sepsis due to Escherichia coli [E. coli]: Secondary | ICD-10-CM | POA: Diagnosis present

## 2015-01-06 DIAGNOSIS — N135 Crossing vessel and stricture of ureter without hydronephrosis: Secondary | ICD-10-CM | POA: Diagnosis present

## 2015-01-06 DIAGNOSIS — Z79899 Other long term (current) drug therapy: Secondary | ICD-10-CM

## 2015-01-06 DIAGNOSIS — T380X5A Adverse effect of glucocorticoids and synthetic analogues, initial encounter: Secondary | ICD-10-CM | POA: Diagnosis present

## 2015-01-06 DIAGNOSIS — Z791 Long term (current) use of non-steroidal anti-inflammatories (NSAID): Secondary | ICD-10-CM

## 2015-01-06 LAB — I-STAT CG4 LACTIC ACID, ED
LACTIC ACID, VENOUS: 4.61 mmol/L — AB (ref 0.5–2.0)
LACTIC ACID, VENOUS: 2.6 mmol/L — AB (ref 0.5–2.0)

## 2015-01-06 LAB — CBC WITH DIFFERENTIAL/PLATELET
BASO%: 0.1 % (ref 0.0–2.0)
BASOS ABS: 0 10*3/uL (ref 0.0–0.1)
BASOS PCT: 0 %
Basophils Absolute: 0 10*3/uL (ref 0.0–0.1)
EOS ABS: 0 10*3/uL (ref 0.0–0.7)
EOS%: 0 % (ref 0.0–7.0)
Eosinophils Absolute: 0 10*3/uL (ref 0.0–0.5)
Eosinophils Relative: 0 %
HCT: 29.5 % — ABNORMAL LOW (ref 34.8–46.6)
HCT: 28.4 % — ABNORMAL LOW (ref 36.0–46.0)
HEMOGLOBIN: 9.6 g/dL — AB (ref 11.6–15.9)
Hemoglobin: 9.4 g/dL — ABNORMAL LOW (ref 12.0–15.0)
LYMPH#: 0.8 10*3/uL — AB (ref 0.9–3.3)
LYMPH%: 2.7 % — ABNORMAL LOW (ref 14.0–49.7)
LYMPHS PCT: 2 %
Lymphs Abs: 0.6 10*3/uL — ABNORMAL LOW (ref 0.7–4.0)
MCH: 26.2 pg (ref 25.1–34.0)
MCH: 26.6 pg (ref 26.0–34.0)
MCHC: 32.5 g/dL (ref 31.5–36.0)
MCHC: 33.1 g/dL (ref 30.0–36.0)
MCV: 80.4 fL (ref 79.5–101.0)
MCV: 80.5 fL (ref 78.0–100.0)
MONO ABS: 2.2 10*3/uL — AB (ref 0.1–1.0)
MONO#: 2.2 10*3/uL — ABNORMAL HIGH (ref 0.1–0.9)
MONO%: 7.9 % (ref 0.0–14.0)
Monocytes Relative: 8 %
NEUT%: 89.3 % — ABNORMAL HIGH (ref 38.4–76.8)
NEUTROS ABS: 25.3 10*3/uL — AB (ref 1.5–6.5)
NEUTROS ABS: 25.1 10*3/uL — AB (ref 1.7–7.7)
NEUTROS PCT: 90 %
NRBC: 0 % (ref 0–0)
PLATELETS: 146 10*3/uL — AB (ref 150–400)
Platelets: 165 10*3/uL (ref 145–400)
RBC: 3.67 10*6/uL — AB (ref 3.70–5.45)
RBC: 3.53 MIL/uL — ABNORMAL LOW (ref 3.87–5.11)
RDW: 16 % — AB (ref 11.2–14.5)
RDW: 16.2 % — AB (ref 11.5–15.5)
WBC: 28.4 10*3/uL — AB (ref 3.9–10.3)
WBC: 27.9 10*3/uL — ABNORMAL HIGH (ref 4.0–10.5)

## 2015-01-06 LAB — COMPREHENSIVE METABOLIC PANEL
ALT: 239 U/L — AB (ref 14–54)
AST: 536 U/L — AB (ref 15–41)
Albumin: 2.6 g/dL — ABNORMAL LOW (ref 3.5–5.0)
Alkaline Phosphatase: 341 U/L — ABNORMAL HIGH (ref 38–126)
Anion gap: 12 (ref 5–15)
BILIRUBIN TOTAL: 2.1 mg/dL — AB (ref 0.3–1.2)
BUN: 17 mg/dL (ref 6–20)
CALCIUM: 8.7 mg/dL — AB (ref 8.9–10.3)
CO2: 20 mmol/L — AB (ref 22–32)
CREATININE: 1.39 mg/dL — AB (ref 0.44–1.00)
Chloride: 101 mmol/L (ref 101–111)
GFR, EST AFRICAN AMERICAN: 53 mL/min — AB (ref >60)
GFR, EST NON AFRICAN AMERICAN: 46 mL/min — AB (ref >60)
GLUCOSE: 131 mg/dL — AB (ref 65–99)
POTASSIUM: 5.4 mmol/L — AB (ref 3.5–5.1)
Sodium: 133 mmol/L — ABNORMAL LOW (ref 135–145)
TOTAL PROTEIN: 8.2 g/dL — AB (ref 6.5–8.1)

## 2015-01-06 LAB — URINALYSIS, ROUTINE W REFLEX MICROSCOPIC
Glucose, UA: NEGATIVE mg/dL
Ketones, ur: NEGATIVE mg/dL
NITRITE: NEGATIVE
PROTEIN: NEGATIVE mg/dL
SPECIFIC GRAVITY, URINE: 1.017 (ref 1.005–1.030)
UROBILINOGEN UA: 0.2 mg/dL (ref 0.0–1.0)
pH: 6 (ref 5.0–8.0)

## 2015-01-06 LAB — PROCALCITONIN: PROCALCITONIN: 40.16 ng/mL

## 2015-01-06 LAB — COMPREHENSIVE METABOLIC PANEL (CC13)
ALBUMIN: 2.5 g/dL — AB (ref 3.5–5.0)
ALK PHOS: 384 U/L — AB (ref 40–150)
ALT: 250 U/L — ABNORMAL HIGH (ref 0–55)
AST: 533 U/L — AB (ref 5–34)
Anion Gap: 13 mEq/L — ABNORMAL HIGH (ref 3–11)
BILIRUBIN TOTAL: 1.12 mg/dL (ref 0.20–1.20)
BUN: 13.2 mg/dL (ref 7.0–26.0)
CO2: 18 meq/L — AB (ref 22–29)
Calcium: 9 mg/dL (ref 8.4–10.4)
Chloride: 105 mEq/L (ref 98–109)
Creatinine: 1.3 mg/dL — ABNORMAL HIGH (ref 0.6–1.1)
EGFR: 58 mL/min/{1.73_m2} — ABNORMAL LOW (ref >90)
GLUCOSE: 125 mg/dL (ref 70–140)
Potassium: 4.3 mEq/L (ref 3.5–5.1)
SODIUM: 136 meq/L (ref 136–145)
TOTAL PROTEIN: 8.2 g/dL (ref 6.4–8.3)

## 2015-01-06 LAB — BASIC METABOLIC PANEL
ANION GAP: 7 (ref 5–15)
BUN: 14 mg/dL (ref 6–20)
CALCIUM: 7.4 mg/dL — AB (ref 8.9–10.3)
CHLORIDE: 110 mmol/L (ref 101–111)
CO2: 18 mmol/L — AB (ref 22–32)
CREATININE: 0.94 mg/dL (ref 0.44–1.00)
GFR calc non Af Amer: >60 mL/min (ref >60)
GLUCOSE: 128 mg/dL — AB (ref 65–99)
Potassium: 3.7 mmol/L (ref 3.5–5.1)
Sodium: 135 mmol/L (ref 135–145)

## 2015-01-06 LAB — APTT: APTT: 36 s (ref 24–37)

## 2015-01-06 LAB — PROTIME-INR
INR: 1.56 — AB (ref 0.00–1.49)
Prothrombin Time: 18.7 seconds — ABNORMAL HIGH (ref 11.6–15.2)

## 2015-01-06 LAB — LIPASE, BLOOD: Lipase: 39 U/L (ref 22–51)

## 2015-01-06 LAB — URINE MICROSCOPIC-ADD ON

## 2015-01-06 LAB — MRSA PCR SCREENING: MRSA BY PCR: NEGATIVE

## 2015-01-06 LAB — BRAIN NATRIURETIC PEPTIDE: B NATRIURETIC PEPTIDE 5: 108.2 pg/mL — AB (ref 0.0–100.0)

## 2015-01-06 MED ORDER — IOHEXOL 300 MG/ML  SOLN: 50.0000 mL | Freq: Once | INTRAMUSCULAR | Status: DC | PRN

## 2015-01-06 MED ORDER — SODIUM CHLORIDE 0.9 % IV BOLUS (SEPSIS)
1000.0000 mL | INTRAVENOUS | Status: AC
  Administered 2015-01-06 (×2): 1000 mL via INTRAVENOUS

## 2015-01-06 MED ORDER — VANCOMYCIN HCL 500 MG IV SOLR
500.0000 mg | Freq: Two times a day (BID) | INTRAVENOUS | Status: DC
  Administered 2015-01-07: 500 mg via INTRAVENOUS
  Filled 2015-01-06 (×2): qty 500

## 2015-01-06 MED ORDER — SODIUM CHLORIDE 0.9 % IV SOLN
INTRAVENOUS | Status: DC
  Administered 2015-01-06 – 2015-01-07 (×3): via INTRAVENOUS
  Administered 2015-01-08: 1000 mL via INTRAVENOUS
  Administered 2015-01-10: 04:00:00 via INTRAVENOUS

## 2015-01-06 MED ORDER — HYDROCORTISONE NA SUCCINATE PF 100 MG IJ SOLR
50.0000 mg | Freq: Four times a day (QID) | INTRAMUSCULAR | Status: DC
  Administered 2015-01-06 – 2015-01-07 (×4): 50 mg via INTRAVENOUS
  Filled 2015-01-06 (×4): qty 2

## 2015-01-06 MED ORDER — PIPERACILLIN-TAZOBACTAM 3.375 G IVPB
3.3750 g | Freq: Three times a day (TID) | INTRAVENOUS | Status: DC
  Administered 2015-01-06 – 2015-01-10 (×11): 3.375 g via INTRAVENOUS
  Filled 2015-01-06 (×12): qty 50

## 2015-01-06 MED ORDER — DEXTROSE 5 % IV SOLN
2.0000 ug/min | INTRAVENOUS | Status: DC
  Administered 2015-01-06: 10 ug/min via INTRAVENOUS
  Filled 2015-01-06: qty 4

## 2015-01-06 MED ORDER — NOREPINEPHRINE BITARTRATE 1 MG/ML IV SOLN
5.0000 ug/min | INTRAVENOUS | Status: DC
  Filled 2015-01-06: qty 4

## 2015-01-06 MED ORDER — SODIUM CHLORIDE 0.9 % IV BOLUS (SEPSIS)
500.0000 mL | INTRAVENOUS | Status: AC
  Administered 2015-01-06: 500 mL via INTRAVENOUS

## 2015-01-06 MED ORDER — HEPARIN SODIUM (PORCINE) 5000 UNIT/ML IJ SOLN
5000.0000 [IU] | Freq: Three times a day (TID) | INTRAMUSCULAR | Status: DC
  Administered 2015-01-06 – 2015-01-11 (×14): 5000 [IU] via SUBCUTANEOUS
  Filled 2015-01-06 (×15): qty 1

## 2015-01-06 MED ORDER — CHLORHEXIDINE GLUCONATE 0.12 % MT SOLN
15.0000 mL | Freq: Two times a day (BID) | OROMUCOSAL | Status: DC
  Administered 2015-01-06 – 2015-01-11 (×10): 15 mL via OROMUCOSAL
  Filled 2015-01-06 (×8): qty 15

## 2015-01-06 MED ORDER — CETYLPYRIDINIUM CHLORIDE 0.05 % MT LIQD
7.0000 mL | Freq: Two times a day (BID) | OROMUCOSAL | Status: DC
  Administered 2015-01-07 – 2015-01-11 (×7): 7 mL via OROMUCOSAL

## 2015-01-06 MED ORDER — PIPERACILLIN-TAZOBACTAM 3.375 G IVPB 30 MIN
3.3750 g | Freq: Once | INTRAVENOUS | Status: AC
  Administered 2015-01-06: 3.375 g via INTRAVENOUS
  Filled 2015-01-06: qty 50

## 2015-01-06 MED ORDER — ACETAMINOPHEN 325 MG PO TABS
650.0000 mg | ORAL_TABLET | Freq: Once | ORAL | Status: AC | PRN
  Administered 2015-01-06: 650 mg via ORAL
  Filled 2015-01-06: qty 2

## 2015-01-06 MED ORDER — IOHEXOL 300 MG/ML  SOLN
100.0000 mL | Freq: Once | INTRAMUSCULAR | Status: AC | PRN
  Administered 2015-01-06: 100 mL via INTRAVENOUS

## 2015-01-06 MED ORDER — SODIUM CHLORIDE 0.9 % IV BOLUS (SEPSIS)
1000.0000 mL | Freq: Once | INTRAVENOUS | Status: AC
Start: 2015-01-06 — End: 2015-01-06
  Administered 2015-01-06: 1000 mL via INTRAVENOUS

## 2015-01-06 MED ORDER — SODIUM CHLORIDE 0.9 % IV SOLN: 250.0000 mL | INTRAVENOUS | Status: DC | PRN

## 2015-01-06 MED ORDER — VANCOMYCIN HCL 10 G IV SOLR
1250.0000 mg | INTRAVENOUS | Status: AC
  Administered 2015-01-06: 1250 mg via INTRAVENOUS
  Filled 2015-01-06: qty 1250

## 2015-01-06 MED ORDER — DIPHENHYDRAMINE HCL 12.5 MG/5ML PO ELIX
25.0000 mg | ORAL_SOLUTION | Freq: Every evening | ORAL | Status: DC | PRN
  Administered 2015-01-06: 50 mg via ORAL
  Administered 2015-01-07 – 2015-01-10 (×4): 25 mg via ORAL
  Filled 2015-01-06 (×2): qty 10
  Filled 2015-01-06 (×3): qty 20

## 2015-01-06 NOTE — ED Notes (Signed)
Pt to CT

## 2015-01-06 NOTE — ED Notes (Signed)
Report given to Caroline, RN.

## 2015-01-06 NOTE — ED Notes (Addendum)
Pt Jehovah Witness, refusing to get type and screen or blood products.  rn asked secretary to page critical care and make them aware pt refusing type and screen or blood products of any type.

## 2015-01-06 NOTE — Progress Notes (Signed)
ANTIBIOTIC CONSULT NOTE - INITIAL  Pharmacy Consult for Vancomycin / Zosyn Indication: Septic Shock  Allergies  Allergen Reactions  . Compazine [Prochlorperazine Maleate] Other (See Comments)    Dystonia  . Azithromycin Rash  . Sulfa Antibiotics Rash  . Hydrocodone-Acetaminophen Nausea And Vomiting    Hallucinating   . 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 SHE DOES NOT WANT ANY BLOOD PRODUCTS OR FRACTIONS    Patient Measurements: Weight: 138 lb (62.596 kg) Adjusted Body Weight:   Vital Signs: Temp: 100.9 F (38.3 C) (10/12 1031) Temp Source: Rectal (10/12 1031) BP: 109/65 mmHg (10/12 1440) Pulse Rate: 92 (10/12 1440) Intake/Output from previous day:   Intake/Output from this shift: Total I/O In: 3500 [I.V.:3500] Out: 550 [Urine:550]  Labs:  Recent Labs  01/06/15 0918 01/06/15 0918 01/06/15 1035  WBC 28.4*  --  27.9*  HGB 9.6*  --  9.4*  PLT 165  --  146*  CREATININE  --  1.3* 1.39*   Estimated Creatinine Clearance: 49.4 mL/min (by C-G formula based on Cr of 1.39). No results for input(s): VANCOTROUGH, VANCOPEAK, VANCORANDOM, GENTTROUGH, GENTPEAK, GENTRANDOM, TOBRATROUGH, TOBRAPEAK, TOBRARND, AMIKACINPEAK, AMIKACINTROU, AMIKACIN in the last 72 hours.   Microbiology: Recent Results (from the past 720 hour(s))  TECHNOLOGIST REVIEW     Status: None   Collection Time: 12/23/14 10:18 AM  Result Value Ref Range Status   Technologist Review occassional meta & myelo  Final    Medical History: Past Medical History  Diagnosis Date  . Eczema   . Lupus (HCC)     joint pain and extreme fatique - improved after plaquenil and prednisone therapy  . Sjogren's disease ([place])     caused pt to loose her teeth due to dry mouth;  prone to eye irritation and infection due to dry eye; affects immune system - PRONE TO INFECTIONS   . Allergy   . Tachycardia   . Protein-calorie malnutrition, severe ([place])   . Abnormal findings on esophagogastroduodenoscopy (EGD) 09/22/13   esophagitis  . Asthma     no recent flare ups - no inhalers - as of 11/19/13  . GERD (gastroesophageal reflux disease)   . Anemia     iron deficiency   . Pneumonia     health care -associated July 2015 post op gastrectomy surgery  . Gastric cancer ([place]) 09/22/13 &10/10/13    adenocarcinoma/metastatic  FIRST CHEMO WAS 11/14/13 - NEXT CHEMO TREATMENT IS 11/21/13.    Assessment: 64 yoF with recent external biliary drain placement for biliary obstruction and metastatic gastric cancer s/p resection and undergoing chemotherapy treatment presents with chills and lethargy since 10/11.  Pt scheduled to undergo chemo today however found to be hypotensive, tachycardic, and febrile at [facility] and transferred to [facility].  Pharmacy consulted to start Vancomycin and Zosyn for septic shock.  First doses ordered in ED.   Anti-infectives 10/12 >> Vancomycin  >> 10/12 >> Zosyn  >>    Vitals/Labs WBC: 28K Tm24h: 100.9 Renal: SCr elevated 1.39 (0.9 in Sept '16), CrCl ~49 ml/min (N59) Lactate: 2.6  Cultures 10/12 bloodx2: IP 10/12 urine: IP   Dose changes/levels: Previous vanc trough = 17.7 on 10/22/13 with vanc 1g q8h (SCr 0.4-0.5)  Goal of Therapy:  Vancomycin trough level 15-20 mcg/ml  Plan:  Vancomycin 1250mg  x 1 in ED, then 500mg  IV q12h Zosyn 3.375g IV q8h (infuse over 4 hours) Check vancomycin trough at [facility] as warranted F/u renal function, cultures, labs, clinical course  Ralene Bathe, PharmD, BCPS  01/06/2015, 3:04 PM  Pager: [phone]

## 2015-01-06 NOTE — ED Notes (Signed)
Pt the most awake she has been throughout entire visit, pt now talking and having conversations with family and rn.

## 2015-01-06 NOTE — Progress Notes (Signed)
[facility]  Patient Status: Active pt with [facility]  Upon admission.  [facility] is providing the following services: HHRN and Home infusion pharmacy for home TNA. [facility] hospital team will follow pt and support transition home when ordered.  If patient discharges after hours, please call [phone].    Larry Sierras 01/06/2015, 3:40 PM

## 2015-01-06 NOTE — ED Notes (Signed)
Pt reports currently being treated for gastric cancer, has liver drain placed last week. Last chemo 9/28. Diarrhea yesterday. Pt reports increased weakness. Pt was sent over by cancer center. Pt alert and oriented x4, but acting sleepy. Pt reports chronic right sided abd pain 4/10. Pt did not take morphine dose this morning.   Pt had port accessed by home health on Sunday, port still in place, tegrederm was dirty and falling off site. Pt recieves daily TPN, except on chemo days.

## 2015-01-06 NOTE — ED Notes (Signed)
Bed: WA03 Expected date:  Expected time:  Means of arrival:  Comments: Hold cancer center pt

## 2015-01-06 NOTE — H&P (Signed)
PULMONARY / CRITICAL CARE MEDICINE   Name: Andrea Best MRN: [id number] DOB: 1970-08-23    ADMISSION DATE:  01/06/2015 CONSULTATION DATE:  01/06/15  REFERRING MD :  Mingo Amber  CHIEF COMPLAINT:  Sepsis   INITIAL PRESENTATION:  Pt is a 44 yo aaf with a PMH consistent with gastric cancer s/p resection with mets tx with chemo and recent external biliary drain placement for obstruction on 9/29 presents to [facility] 10/12 c/o chills and lethargy. Pt presented to oncology clinic on 10/12 for usually scheduled chemo treatment and was found to be hypotensive, tachycardic with a fever of 38.1. PCCM asked to admit for sepsis   STUDIES:  10/12 CT abd/pelvis > Rt hydronephrosis with ureteral dilation w/o obstructing stone. Questionable soft-tissue enhancement suggesting metastatic involvement   SIGNIFICANT EVENTS: 10/12 > Admitted to ICU for sepsis    HISTORY OF PRESENT ILLNESS:  Pt is a 44 yo aaf with a PMH consistent with lupus, sjogren's disease, asthma, GERD, anemia, and gastric cancer s/p resection with metastasis currently being treated with chemo. Pt had recent external biliary drain placement for biliary obstruction on 9/29 and now presents to [facility] 10/12 c/o chills and lethargy. Pt was in [place] until late 10/11 when she developed chills.  Pt presented to oncology clinic on 10/12 for usually scheduled chemo treatment and was found to be hypotensive, tachycardic with a fever of 38.1. She was sent to [facility] for further treatment.  PCCM asked to admit for sepsis.   PAST MEDICAL HISTORY :   has a past medical history of Eczema; Lupus ([place]); Sjogren's disease ([place]); Allergy; Tachycardia; Protein-calorie malnutrition, severe ([place]); Abnormal findings on esophagogastroduodenoscopy (EGD) (09/22/13); Asthma; GERD (gastroesophageal reflux disease); Anemia; Pneumonia; and Gastric cancer ([place]) (09/22/13 &10/10/13).  has past surgical history that includes Cesarean section; laparotomy (N/A, 10/10/2013); and Portacath  placement (Right, 11/20/2013). Prior to Admission medications   Medication Sig Start Date End Date Taking? Authorizing Provider  dexamethasone (DECADRON) 4 MG tablet Take 2 tablets (8 mg total) by mouth 2 (two) times daily. Take 8 mg twice a day for two days beginning the day after chemotherapy. 12/23/14  Yes Owens Shark, NP  DiphenhydrAMINE HCl (ZZZQUIL) 50 MG/30ML LIQD Take 30 mLs by mouth at bedtime as needed (for sleep).   Yes Historical Provider, MD  ibuprofen (ADVIL,MOTRIN) 200 MG tablet Take 200-600 mg by mouth every 6 (six) hours as needed for headache or moderate pain.   Yes Historical Provider, MD  lidocaine-prilocaine (EMLA) cream Appy small amount of cream over port site 1-2 hours prior to treatment and cover with plastic wrap.  DO NOT RUB IN Patient taking differently: Apply 1 application topically daily as needed. Appy small amount of cream over port site 1-2 hours prior to treatment and cover with plastic wrap.  DO NOT RUB IN 03/18/14  Yes Owens Shark, NP  LORazepam (ATIVAN) 0.5 MG tablet Take 1 tablet (0.5 mg total) by mouth every 8 (eight) hours as needed (nausea). May take oral or sublingual every 8 hours as needed for nausea 12/23/14  Yes Owens Shark, NP  morphine (MS CONTIN) 15 MG 12 hr tablet Take 1 tablet (15 mg total) by mouth every 12 (twelve) hours. 12/23/14  Yes Owens Shark, NP  ondansetron (ZOFRAN) 4 MG tablet Take 1 tablet (4 mg total) by mouth every 8 (eight) hours as needed for nausea or vomiting. 12/02/14  Yes Owens Shark, NP  oxyCODONE-acetaminophen (PERCOCET/ROXICET) 5-325 MG per tablet Take 1 tablet by mouth  every 4 (four) hours as needed for severe pain. 12/16/14  Yes Owens Shark, NP  [place]   Yes Historical Provider, MD  cephALEXin (KEFLEX) 500 MG capsule Take 1 capsule (500 mg total) by mouth 2 (two) times daily. Patient not taking: Reported on 12/24/2014 11/22/14   Linton Flemings, MD  hydroxychloroquine (PLAQUENIL) 200 MG tablet Take 1  tablet (200 mg total) by mouth 2 (two) times daily. Patient not taking: Reported on 12/24/2014 09/17/14   Ladell Pier, MD   Allergies  Allergen Reactions  . Compazine [Prochlorperazine Maleate] Other (See Comments)    Dystonia  . Azithromycin Rash  . Sulfa Antibiotics Rash  . Hydrocodone-Acetaminophen Nausea And Vomiting    Hallucinating   . 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 SHE DOES NOT WANT ANY BLOOD PRODUCTS OR FRACTIONS    FAMILY HISTORY:  has no family status information on file.  SOCIAL HISTORY:  reports that she has never smoked. She has never used smokeless tobacco. She reports that she drinks alcohol. She reports that she does not use illicit drugs.  REVIEW OF SYSTEMS:   Review of Systems:   Bolds are positive  Constitutional: weight loss, gain, night sweats, Fevers, chills, fatigue .  HEENT: headaches, Sore throat, sneezing, nasal congestion, post nasal drip, Difficulty swallowing, Tooth/dental problems, visual complaints visual changes, ear ache CV:  chest pain, radiates: ,Orthopnea, PND, swelling in lower extremities, dizziness, palpitations, syncope.  GI  heartburn, indigestion, abdominal pain, nausea, vomiting, diarrhea, change in bowel habits, loss of appetite, bloody stools.  Resp: cough, productive: , hemoptysis, dyspne on exertion only, chest pain, pleuritic.  Skin: rash or itching or icterus GU: dysuria, change in color of urine, urgency or frequency. flank pain, hematuria  MS: joint pain or swelling. decreased range of motion  Psych: change in mood or affect. depression or anxiety.  Neuro: difficulty with speech, weakness, numbness, ataxia    SUBJECTIVE:  Pt resting in bed in NAD, states she is feeling weak and sleepy.   VITAL SIGNS: Temp:  [100 F (37.8 C)-100.9 F (38.3 C)] 100.9 F (38.3 C) (10/12 1031) Pulse Rate:  [62-127] 91 (10/12 1430) Resp:  [15-31] 15 (10/12 1430) BP: (74-132)/(40-72) 125/65 mmHg (10/12 1430) SpO2:  [95 %-100 %] 96 %  (10/12 1355) Weight:  [62.596 kg (138 lb)] 62.596 kg (138 lb) (10/12 1041) HEMODYNAMICS:   VENTILATOR SETTINGS:   INTAKE / OUTPUT:  Intake/Output Summary (Last 24 hours) at 01/06/15 1435 Last data filed at 01/06/15 1415  Gross per 24 hour  Intake   3500 ml  Output      0 ml  Net   3500 ml    PHYSICAL EXAMINATION: General: Chronically ill appearing female resting in bed in NAD  Neuro:  A&Ox4, no focal deficits  HEENT: NCAT, MM dry  Cardiovascular: RRR  Lungs: Lungs CTA bilaterally  Abdomen:  Soft, non-tender, non-distended. Musculoskeletal: Intact, no edema  Skin: Intact   LABS:  CBC  Recent Labs Lab 01/06/15 0918 01/06/15 1035  WBC 28.4* 27.9*  HGB 9.6* 9.4*  HCT 29.5* 28.4*  PLT 165 146*   Coag's No results for input(s): APTT, INR in the last 168 hours. BMET  Recent Labs Lab 01/06/15 0918 01/06/15 1035  NA 136 133*  K 4.3 5.4*  CL  --  101  CO2 18* 20*  BUN 13.2 17  CREATININE 1.3* 1.39*  GLUCOSE 125 131*   Electrolytes  Recent Labs Lab 01/06/15  5035 01/06/15 1035  CALCIUM 9.0 8.7*   Sepsis Markers  Recent Labs Lab 01/06/15 1042 01/06/15 1359  LATICACIDVEN 4.61* 2.60*   ABG No results for input(s): PHART, PCO2ART, PO2ART in the last 168 hours. Liver Enzymes  Recent Labs Lab 01/06/15 0918 01/06/15 1035  AST 533* 536*  ALT 250* 239*  ALKPHOS 384* 341*  BILITOT 1.12 2.1*  ALBUMIN 2.5* 2.6*   Cardiac Enzymes No results for input(s): TROPONINI, PROBNP in the last 168 hours. Glucose No results for input(s): GLUCAP in the last 168 hours.  Imaging Dg Chest 2 View  01/06/2015  CLINICAL DATA:  Weakness and fatigue starting last night. Low back pain. Diarrhea. Sepsis. Chemotherapy. Lupus. EXAM: CHEST  2 VIEW COMPARISON:  11/21/2014 FINDINGS: Power injectable Port-A-Cath tip:  Cavoatrial junction. Low lung volumes are present, causing crowding of the pulmonary vasculature. Bandlike airspace opacity along the right hemidiaphragm  favoring atelectasis based on configuration. No pleural effusion identified. Pigtail drain over the right upper quadrant compatible with biliary drain. IMPRESSION: 1. Bandlike opacity at the right lung base favoring atelectasis based on configuration. 2. Percutaneous biliary drain. 3. Port-A-Cath tip:  Cavoatrial junction. Electronically Signed   By: Van Clines M.D.   On: 01/06/2015 12:02   Ct Abdomen Pelvis W Contrast  01/06/2015  CLINICAL DATA:  Current history of gastric cancer. Fever. Low back pain. EXAM: CT ABDOMEN AND PELVIS WITH CONTRAST TECHNIQUE: Multidetector CT imaging of the abdomen and pelvis was performed using the standard protocol following bolus administration of intravenous contrast. CONTRAST:  118mL OMNIPAQUE IOHEXOL 300 MG/ML  SOLN COMPARISON:  CT scan of November 22, 2014. FINDINGS: Visualized lung bases are unremarkable. No significant osseous abnormality is noted. No gallstones are noted. Percutaneous biliary drainage catheter is again noted and unchanged in position compared to prior exam. No significant change in mild intrahepatic biliary dilatation is noted. Stable splenomegaly is noted. Stable mild pancreatic ductal dilatation is noted. Status post partial gastrectomy. No evidence of bowel obstruction is noted. The appendix appears normal. Adrenal glands and left kidney appear normal. Mild right hydronephrosis and proximal ureteral dilatation is noted ; there is noted an area of abnormal enhancement involving the mid ureter which may represent neoplastic involvement. Stable left ovarian cyst is noted. Stable fibroid is noted in uterine fundus. No abnormal fluid collection is noted. Urinary bladder appears normal. There is continued presence of abnormal soft tissue density seen along the anterior abdominal wall which is decreased compared to prior exam, consistent with improving peritoneal metastatic focus. Stable soft tissue density is seen in the porta hepatis region consistent  with metastatic disease. IMPRESSION: Stable splenomegaly. Percutaneous biliary drainage catheter is unchanged in position compared to prior exam, with no significant change in mild intrahepatic biliary dilatation or pancreatic ductal dilatation. Status post partial gastrectomy. Stable left ovarian cyst is noted compared to prior exam. Continued presence of abnormal soft tissue density seen along the anterior abdominal wall which is decreased compared to prior exam, consistent with improving peritoneal metastatic focus. Stable soft tissue density seen in porta hepatis region consistent with metastatic disease. Interval development of mild right hydronephrosis and proximal ureteral dilatation without obstructing calculus. Enhancing soft tissue abnormality is seen involving the midportion of the right ureter concerning for metastatic disease. Electronically Signed   By: Marijo Conception, M.D.   On: 01/06/2015 13:36     ASSESSMENT / PLAN:  PULMONARY A: Rt sided ATX  P:   Pulmonary hygiene  IS    CARDIOVASCULAR A:  Hypotension r/t septic shock + volume depletion  P:  Levophed gtt MAP goal >65  See ID   RENAL A:   Right-sided Renal Hydronephrosis  AKI > Could be pre-renal in the setting of sepsis or post-renal d/t metastatic involvement of Rt ureter Anion Gap Metabolic Acidosis > Lactic --> clearing with NS P:   Consult Urology and IR IVF- NS  Monitor UO   GASTROINTESTINAL A:  Gastric Cancer  Biliary Drain s/p biliary obstruction  Severe Protein Calorie Malnutrition  P:   Consult IR to draw culture from biliary drain  Hold TPN for now until cultures result   HEMATOLOGIC A:  Chronic Iron Deficiency Anemia  Thrombocytopenia  P:  Trend CBC  Limit blood draws d/t patient's religious beliefs (Jehovah's witness)  DVT prophylaxis with heparin   INFECTIOUS/AUTOIMMUNE A:   Septic Shock source unclear  Pt with multiple potential etiologies including port-a-cath, biliary drain, and  new hydronephrosis  H/o lupus    P:   BCx2 10/12>>> UC 10/12>>> Biliary Drain Culture 10/12 >>  Vancomycin 10/12>>> Zosyn 10/12 >> Hold plaquenil  ENDOCRINE A:   Probable adrenal insufficiency d/t chronic steroid use  P:   Solu-cortef 50 mg   NEUROLOGIC A:   No acute  P:   Monitor     FAMILY  - Updates: Family at bedside, extensively updated about patient's condition and questions answered.   - Inter-disciplinary family meet or Palliative Care meeting due by: 10/19   TODAY'S SUMMARY: Pt is 89 yof with complicated PMH c/w gastric cancer s/p resection, new biliary drain placement on 9/29 for biliary obstruction currently receiving chemo with new onset Rt sided hydronephrosis and septic shock.  She has had her 30 ml/kg fluid challenge. Lactic acid has gone from 4.61-->2.6. Sepsis - Repeat Assessment  Performed at: 220 pm   Vitals     Blood pressure 107/60, pulse 95, temperature 100.9 F (38.3 C), temperature source Rectal, resp. rate 23, weight 62.596 kg (138 lb), last menstrual period 11/23/2014, SpO2 100 %.  Heart:     Regular rate and rhythm  Lungs:    CTA  Capillary Refill:   <2 sec  Peripheral Pulse:   Radial pulse palpable  Skin:     Normal Color Admit to ICU for sepsis- pan culture, IVF, atbx, pressors. Consult urology for new hydronephrosis and consult IR for drain.     Erick Colace ACNP-BC [place] Pager # [phone] OR # [phone] if no answer   01/06/2015, 2:35 PM  Attending Note:  I have examined patient, reviewed labs, studies and notes. I have discussed the case with Jerrye Bushy, and I agree with the data and plans as amended above. Pt is undergoing chemotherapy for metastatic gastric cancer, c/b biliary obstruction s/p drain placement. She was febrile, weak, ill appearing this am and directed to the ED. On my evaluation this pm she was awake, experiencing back pain, tachycardic. She was on norepi at 10. Lactate has cleared  with IVF. CT abd shows mild R hydronephrosis. Potential septic sources here include port-a-cath, pyelonephritis, biliary drain. We will continue broad abx, wean pressors as able. Will ask urology and IR to assist with R ureteral obstruction. I will hold her TPN for now while potentially bacteremic, restart when stable to do so.  Independent critical care time is 60 minutes.   Baltazar Apo, MD, PhD 01/06/2015, 5:22 PM [facility] [phone] or if no answer [phone]

## 2015-01-06 NOTE — ED Notes (Signed)
Pt started dry heaving. Scant amount of saliva vomited up.

## 2015-01-06 NOTE — ED Notes (Addendum)
Pt states that she began feeling weak and tired last night.  C/o low back pain.  Had a new paracentesis drain placed last week.  Diarrhea this morning but no vomiting.  Last chemo was a week before last.  C/o cough but states this is normal for her.

## 2015-01-06 NOTE — Progress Notes (Signed)
Patient ID: Andrea Best, female   DOB: 07-31-1970, 44 y.o.   MRN: [id number]    Referring Physician(s): CCM  Chief Complaint:  Recurrent gastric cancer with malignant biliary obstruction, sepsis  Subjective:  Pt familiar to IR service from placement of I/E biliary drain on 11/19/14; drain was exchanged on 12/24/14 and capped. Presented to oncology today for chemo and noted to be hypotensive, lethargic with low grade temp elevation. Now admitted to CCM. Currently she is afebrile with nl creat, nl K, t bili 2.1, PT 18.7/INR 1.56. CT A/P today revealed stable appearance of biliary drain, mild rt hydronephrosis with enhancing ST abnormality involving midportion of rt ureter ? Met dz. Urology consult pend.   Allergies: Compazine; Azithromycin; Sulfa antibiotics; Hydrocodone-acetaminophen; and Other  Medications: Prior to Admission medications   Medication Sig Start Date End Date Taking? Authorizing Provider  dexamethasone (DECADRON) 4 MG tablet Take 2 tablets (8 mg total) by mouth 2 (two) times daily. Take 8 mg twice a day for two days beginning the day after chemotherapy. 12/23/14  Yes Owens Shark, NP  DiphenhydrAMINE HCl (ZZZQUIL) 50 MG/30ML LIQD Take 30 mLs by mouth at bedtime as needed (for sleep).   Yes Historical Provider, MD  ibuprofen (ADVIL,MOTRIN) 200 MG tablet Take 200-600 mg by mouth every 6 (six) hours as needed for headache or moderate pain.   Yes Historical Provider, MD  lidocaine-prilocaine (EMLA) cream Appy small amount of cream over port site 1-2 hours prior to treatment and cover with plastic wrap.  DO NOT RUB IN Patient taking differently: Apply 1 application topically daily as needed. Appy small amount of cream over port site 1-2 hours prior to treatment and cover with plastic wrap.  DO NOT RUB IN 03/18/14  Yes Owens Shark, NP  LORazepam (ATIVAN) 0.5 MG tablet Take 1 tablet (0.5 mg total) by mouth every 8 (eight) hours as needed (nausea). May take oral or sublingual  every 8 hours as needed for nausea 12/23/14  Yes Owens Shark, NP  morphine (MS CONTIN) 15 MG 12 hr tablet Take 1 tablet (15 mg total) by mouth every 12 (twelve) hours. 12/23/14  Yes Owens Shark, NP  ondansetron (ZOFRAN) 4 MG tablet Take 1 tablet (4 mg total) by mouth every 8 (eight) hours as needed for nausea or vomiting. 12/02/14  Yes Owens Shark, NP  oxyCODONE-acetaminophen (PERCOCET/ROXICET) 5-325 MG per tablet Take 1 tablet by mouth every 4 (four) hours as needed for severe pain. 12/16/14  Yes Owens Shark, NP  [place]   Yes Historical Provider, MD  TPN ADULT Inject into the vein continuous. [place]   Yes Historical Provider, MD  hydroxychloroquine (PLAQUENIL) 200 MG tablet Take 1 tablet (200 mg total) by mouth 2 (two) times daily. Patient not taking: Reported on 12/24/2014 09/17/14   Ladell Pier, MD     Vital Signs: BP 104/67 mmHg  Pulse 93  Temp(Src) 98.8 F (37.1 C) (Temporal)  Resp 20  Wt 138 lb (62.596 kg)  SpO2 100%  LMP 11/23/2014 (Approximate)  Physical Exam pt a little lethargic but arousable/talkative; chest - CTA bilat; clean intact PAC;  heart- RRR; biliary drain RUQ intact, insertion site ok, NT, drain capped, abd soft  Imaging: Dg Chest 2 View  01/06/2015  CLINICAL DATA:  Weakness and fatigue starting last night. Low back pain. Diarrhea. Sepsis. Chemotherapy. Lupus. EXAM: CHEST  2 VIEW COMPARISON:  11/21/2014 FINDINGS: Power injectable Port-A-Cath tip:  Cavoatrial junction. Low lung volumes are present, causing crowding of the pulmonary vasculature. Bandlike airspace opacity along the right hemidiaphragm favoring atelectasis based on configuration. No pleural effusion identified. Pigtail drain over the right upper quadrant compatible with biliary drain. IMPRESSION: 1. Bandlike opacity at the right lung base favoring atelectasis based on configuration. 2. Percutaneous biliary drain. 3. Port-A-Cath tip:  Cavoatrial  junction. Electronically Signed   By: Van Clines M.D.   On: 01/06/2015 12:02   Ct Abdomen Pelvis W Contrast  01/06/2015  CLINICAL DATA:  Current history of gastric cancer. Fever. Low back pain. EXAM: CT ABDOMEN AND PELVIS WITH CONTRAST TECHNIQUE: Multidetector CT imaging of the abdomen and pelvis was performed using the standard protocol following bolus administration of intravenous contrast. CONTRAST:  170m OMNIPAQUE IOHEXOL 300 MG/ML  SOLN COMPARISON:  CT scan of November 22, 2014. FINDINGS: Visualized lung bases are unremarkable. No significant osseous abnormality is noted. No gallstones are noted. Percutaneous biliary drainage catheter is again noted and unchanged in position compared to prior exam. No significant change in mild intrahepatic biliary dilatation is noted. Stable splenomegaly is noted. Stable mild pancreatic ductal dilatation is noted. Status post partial gastrectomy. No evidence of bowel obstruction is noted. The appendix appears normal. Adrenal glands and left kidney appear normal. Mild right hydronephrosis and proximal ureteral dilatation is noted ; there is noted an area of abnormal enhancement involving the mid ureter which may represent neoplastic involvement. Stable left ovarian cyst is noted. Stable fibroid is noted in uterine fundus. No abnormal fluid collection is noted. Urinary bladder appears normal. There is continued presence of abnormal soft tissue density seen along the anterior abdominal wall which is decreased compared to prior exam, consistent with improving peritoneal metastatic focus. Stable soft tissue density is seen in the porta hepatis region consistent with metastatic disease. IMPRESSION: Stable splenomegaly. Percutaneous biliary drainage catheter is unchanged in position compared to prior exam, with no significant change in mild intrahepatic biliary dilatation or pancreatic ductal dilatation. Status post partial gastrectomy. Stable left ovarian cyst is noted  compared to prior exam. Continued presence of abnormal soft tissue density seen along the anterior abdominal wall which is decreased compared to prior exam, consistent with improving peritoneal metastatic focus. Stable soft tissue density seen in porta hepatis region consistent with metastatic disease. Interval development of mild right hydronephrosis and proximal ureteral dilatation without obstructing calculus. Enhancing soft tissue abnormality is seen involving the midportion of the right ureter concerning for metastatic disease. Electronically Signed   By: JMarijo Conception M.D.   On: 01/06/2015 13:36    Labs:  CBC:  Recent Labs  12/23/14 1018 12/24/14 0905 01/06/15 0918 01/06/15 1035  WBC 7.8 16.0* 28.4* 27.9*  HGB 9.7* 9.7* 9.6* 9.4*  HCT 29.9* 29.0* 29.5* 28.4*  PLT 158 227 165 146*    COAGS:  Recent Labs  06/24/14 1142 11/19/14 0714 12/24/14 0905 01/06/15 1450  INR 1.04 1.15 1.18 1.56*  APTT 30  --   --  36    BMP:  Recent Labs  11/26/14 1415  12/24/14 0905 01/06/15 0918 01/06/15 1035 01/06/15 1450  NA 134*  < > 133* 136 133* 135  K 3.9  < > 4.2 4.3 5.4* 3.7  CL 96*  --  100*  --  101 110  CO2 25  < > 25 18* 20* 18*  GLUCOSE 107*  < > 107* 125 131* 128*  BUN 8  < > 15 13.2 17 14   CALCIUM 8.6  < >  9.5 9.0 8.7* 7.4*  CREATININE 0.87  < > 0.90 1.3* 1.39* 0.94  GFRNONAA 83  --  >60  --  46* >60  GFRAA >89  --  >60  --  53* >60  < > = values in this interval not displayed.  LIVER FUNCTION TESTS:  Recent Labs  12/23/14 1018 12/24/14 0905 01/06/15 0918 01/06/15 1035  BILITOT 0.72 0.7 1.12 2.1*  AST 46* 47* 533* 536*  ALT 31 32 250* 239*  ALKPHOS 182* 174* 384* 341*  PROT 8.3 9.4* 8.2 8.2*  ALBUMIN 2.7* 3.2* 2.5* 2.6*    Assessment and Plan: 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, s/p I/E biliary drain 11/19/14 with last exchange on 12/24/14; drain currently capped; now admitted under sepsis protocol; check bile  cx's/sens, ammonia; hydrate; biliary drain attached to gravity bag - monitor electrolytes/hepatic function daily; await urology consult before any potential intervention regarding hydronephrosis; decision to place internal metallic biliary stent once over acute septic episode rests with oncology service but given pt's prior partial gastric resection she is not a candidate for ERCP to remove stent should it become clogged. Otherwise would cont with routine biliary drain exchange/ cholangiogram which has been scheduled for 02/23/15.   Signed: D. Rowe Robert 01/06/2015, 5:37 PM   I spent a total of 15 minutes at the the patient's bedside AND on the patient's hospital floor or unit, greater than 50% of which was counseling/coordinating care for biliary drain

## 2015-01-06 NOTE — ED Notes (Signed)
Critical care at bedside  

## 2015-01-06 NOTE — ED Notes (Signed)
Dr Mingo Amber notified of lactic of 4.61

## 2015-01-06 NOTE — Consult Note (Signed)
Urology Consult   Physician requesting consult: Dr. Evelina Bucy  Reason for consult: Right hydronephrosis  History of Present Illness: Andrea Best is a 44 y.o. with a history of metastatic gastric cancer s/p resection and systemic chemotherapy.  She is under the care of Dr. Julieanne Manson. She presented for her 3rd cycle of chemotherapy today and was noted to be acutely ill with probable sepsis and so she was transferred to the ED for evaluation.  She has now been admitted to the ICU with CCM.  She was incidentally noted to have mild to moderate right hydronephrosis on CT imaging today prompting a urologic consult. She does admit to some tenderness over her right flank although she also has an indwelling biliary drain.  She states that this pain has been intermittent over the past few months and well controlled with Tylenol.   She denies a history of voiding or storage urinary symptoms, hematuria, UTIs, STDs, urolithiasis, GU malignancy/trauma/surgery.  Past Medical History  Diagnosis Date  . Eczema   . Lupus (HCC)     joint pain and extreme fatique - improved after plaquenil and prednisone therapy  . Sjogren's disease ([place])     caused pt to loose her teeth due to dry mouth;  prone to eye irritation and infection due to dry eye; affects immune system - PRONE TO INFECTIONS   . Allergy   . Tachycardia   . Protein-calorie malnutrition, severe ([place])   . Abnormal findings on esophagogastroduodenoscopy (EGD) 09/22/13    esophagitis  . Asthma     no recent flare ups - no inhalers - as of 11/19/13  . GERD (gastroesophageal reflux disease)   . Anemia     iron deficiency   . Pneumonia     health care -associated July 2015 post op gastrectomy surgery  . Gastric cancer ([place]) 09/22/13 &10/10/13    adenocarcinoma/metastatic  FIRST CHEMO WAS 11/14/13 - NEXT CHEMO TREATMENT IS 11/21/13.    Past Surgical History  Procedure Laterality Date  . Cesarean section      1997  . Laparotomy N/A  10/10/2013    Procedure: SUBTOTAL GASTRECTOMY WITH GASTRIC JEJUNOSOTMY;  Surgeon: Edward Jolly, MD;  Location: [facility];  Service: General;  Laterality: N/A;  . Portacath placement Right 11/20/2013    Procedure: INSERTION PORT-A-CATH with ULTRA SOUND;  Surgeon: Adin Hector, MD;  Location: [facility];  Service: General;  Laterality: Right;     Current Hospital Medications:  Home meds:    Medication List    ASK your doctor about these medications        dexamethasone 4 MG tablet  Commonly known as:  DECADRON  Take 2 tablets (8 mg total) by mouth 2 (two) times daily. Take 8 mg twice a day for two days beginning the day after chemotherapy.     hydroxychloroquine 200 MG tablet  Commonly known as:  PLAQUENIL  Take 1 tablet (200 mg total) by mouth 2 (two) times daily.     ibuprofen 200 MG tablet  Commonly known as:  ADVIL,MOTRIN  Take 200-600 mg by mouth every 6 (six) hours as needed for headache or moderate pain.     lidocaine-prilocaine cream  Commonly known as:  EMLA  Appy small amount of cream over port site 1-2 hours prior to treatment and cover with plastic wrap.  DO NOT RUB IN     LORazepam 0.5 MG tablet  Commonly known as:  ATIVAN  Take 1 tablet (0.5 mg total) by  mouth every 8 (eight) hours as needed (nausea). May take oral or sublingual every 8 hours as needed for nausea     morphine 15 MG 12 hr tablet  Commonly known as:  MS CONTIN  Take 1 tablet (15 mg total) by mouth every 12 (twelve) hours.     ondansetron 4 MG tablet  Commonly known as:  ZOFRAN  Take 1 tablet (4 mg total) by mouth every 8 (eight) hours as needed for nausea or vomiting.     oxyCODONE-acetaminophen 5-325 MG tablet  Commonly known as:  PERCOCET/ROXICET  Take 1 tablet by mouth every 4 (four) hours as needed for severe pain.     PRESCRIPTION MEDICATION  Chemo - [facility]     TPN ADULT  Inject into the vein continuous. [place] 50 MG/30ML Liqd  Generic drug:   DiphenhydrAMINE HCl  Take 30 mLs by mouth at bedtime as needed (for sleep).        Scheduled Meds: . [START ON 01/07/2015] antiseptic oral rinse  7 mL Mouth Rinse q12n4p  . chlorhexidine  15 mL Mouth Rinse BID  . heparin  5,000 Units Subcutaneous 3 times per day  . hydrocortisone sod succinate (SOLU-CORTEF) inj  50 mg Intravenous Q6H  . piperacillin-tazobactam (ZOSYN)  IV  3.375 g Intravenous Q8H  . [START ON 01/07/2015] vancomycin  500 mg Intravenous Q12H   Continuous Infusions: . sodium chloride 125 mL/hr at 01/06/15 1717  . norepinephrine (LEVOPHED) Adult infusion 1 mcg/min (01/06/15 1741)   PRN Meds:.sodium chloride  Allergies:  Allergies  Allergen Reactions  . Compazine [Prochlorperazine Maleate] Other (See Comments)    Dystonia  . Azithromycin Rash  . Sulfa Antibiotics Rash  . Hydrocodone-Acetaminophen Nausea And Vomiting    Hallucinating   . 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 SHE DOES NOT WANT ANY BLOOD PRODUCTS OR FRACTIONS    Family History  Problem Relation Age of Onset  . Hypertension Mother   . Diabetes Mother   . Diabetes Maternal Grandmother   . Diabetes Maternal Grandfather     Social History:  reports that she has never smoked. She has never used smokeless tobacco. She reports that she drinks alcohol. She reports that she does not use illicit drugs.  ROS: A complete review of systems was performed.  All systems are negative except for pertinent findings as noted.  Physical Exam:  Vital signs in last 24 hours: Temp:  [98.8 F (37.1 C)-100.9 F (38.3 C)] 98.8 F (37.1 C) (10/12 1546) Pulse Rate:  [62-127] 94 (10/12 1830) Resp:  [13-31] 13 (10/12 1830) BP: (74-132)/(40-88) 100/64 mmHg (10/12 1830) SpO2:  [95 %-100 %] 100 % (10/12 1830) Weight:  [62.596 kg (138 lb)] 62.596 kg (138 lb) (10/12 1041) Constitutional:  Alert and oriented, No acute distress Cardiovascular: Regular rate and rhythm, No JVD Respiratory: Normal respiratory effort, Lungs  clear bilaterally GI: Abdomen is soft, nontender, nondistended, no abdominal masses, her biliary drain is now to straight drainage with bilious fluid in the bag. GU: Mild right CVA tenderness Lymphatic: No lymphadenopathy Neurologic: Grossly intact, no focal deficits Psychiatric: Normal mood and affect  Laboratory Data:   Recent Labs  01/06/15 0918 01/06/15 1035  WBC 28.4* 27.9*  HGB 9.6* 9.4*  HCT 29.5* 28.4*  PLT 165 146*     Recent Labs  01/06/15 0918 01/06/15 1035 01/06/15 1450  NA 136 133* 135  K 4.3 5.4* 3.7  CL  --  101 110  GLUCOSE 125 131* 128*  BUN 13.2 17 14   CALCIUM 9.0 8.7* 7.4*  CREATININE 1.3* 1.39* 0.94     Results for orders placed or performed during the hospital encounter of 01/06/15 (from the past 24 hour(s))  Comprehensive metabolic panel     Status: Abnormal   Collection Time: 01/06/15 10:35 AM  Result Value Ref Range   Sodium 133 (L) 135 - 145 mmol/L   Potassium 5.4 (H) 3.5 - 5.1 mmol/L   Chloride 101 101 - 111 mmol/L   CO2 20 (L) 22 - 32 mmol/L   Glucose, Bld 131 (H) 65 - 99 mg/dL   BUN 17 6 - 20 mg/dL   Creatinine, Ser 1.39 (H) 0.44 - 1.00 mg/dL   Calcium 8.7 (L) 8.9 - 10.3 mg/dL   Total Protein 8.2 (H) 6.5 - 8.1 g/dL   Albumin 2.6 (L) 3.5 - 5.0 g/dL   AST 536 (H) 15 - 41 U/L   ALT 239 (H) 14 - 54 U/L   Alkaline Phosphatase 341 (H) 38 - 126 U/L   Total Bilirubin 2.1 (H) 0.3 - 1.2 mg/dL   GFR calc non Af Amer 46 (L) >60 mL/min   GFR calc Af Amer 53 (L) >60 mL/min   Anion gap 12 5 - 15  CBC with Differential     Status: Abnormal   Collection Time: 01/06/15 10:35 AM  Result Value Ref Range   WBC 27.9 (H) 4.0 - 10.5 K/uL   RBC 3.53 (L) 3.87 - 5.11 MIL/uL   Hemoglobin 9.4 (L) 12.0 - 15.0 g/dL   HCT 28.4 (L) 36.0 - 46.0 %   MCV 80.5 78.0 - 100.0 fL   MCH 26.6 26.0 - 34.0 pg   MCHC 33.1 30.0 - 36.0 g/dL   RDW 16.2 (H) 11.5 - 15.5 %   Platelets 146 (L) 150 - 400 K/uL   Neutrophils Relative % 90 %   Lymphocytes Relative 2 %    Monocytes Relative 8 %   Eosinophils Relative 0 %   Basophils Relative 0 %   Neutro Abs 25.1 (H) 1.7 - 7.7 K/uL   Lymphs Abs 0.6 (L) 0.7 - 4.0 K/uL   Monocytes Absolute 2.2 (H) 0.1 - 1.0 K/uL   Eosinophils Absolute 0.0 0.0 - 0.7 K/uL   Basophils Absolute 0.0 0.0 - 0.1 K/uL   WBC Morphology      MODERATE LEFT SHIFT (>5% METAS AND MYELOS,OCC PRO NOTED)   Smear Review LARGE PLATELETS PRESENT   I-Stat CG4 Lactic Acid, ED  (not at [facility])     Status: Abnormal   Collection Time: 01/06/15 10:42 AM  Result Value Ref Range   Lactic Acid, Venous 4.61 (HH) 0.5 - 2.0 mmol/L   Comment NOTIFIED PHYSICIAN   Urinalysis, Routine w reflex microscopic (not at [facility])     Status: Abnormal   Collection Time: 01/06/15 10:44 AM  Result Value Ref Range   Color, Urine AMBER (A) YELLOW   APPearance CLEAR CLEAR   Specific Gravity, Urine 1.017 1.005 - 1.030   pH 6.0 5.0 - 8.0   Glucose, UA NEGATIVE NEGATIVE mg/dL   Hgb urine dipstick SMALL (A) NEGATIVE   Bilirubin Urine SMALL (A) NEGATIVE   Ketones, ur NEGATIVE NEGATIVE mg/dL   Protein, ur NEGATIVE NEGATIVE mg/dL   Urobilinogen, UA 0.2 0.0 - 1.0 mg/dL   Nitrite NEGATIVE NEGATIVE   Leukocytes, UA SMALL (A) NEGATIVE  Urine microscopic-add on     Status: Abnormal   Collection Time:  01/06/15 10:44 AM  Result Value Ref Range   Squamous Epithelial / LPF RARE RARE   WBC, UA 7-10 <3 WBC/hpf   RBC / HPF 3-6 <3 RBC/hpf   Bacteria, UA FEW (A) RARE   Casts HYALINE CASTS (A) NEGATIVE   Urine-Other MUCOUS PRESENT   I-Stat CG4 Lactic Acid, ED  (not at  [facility])     Status: Abnormal   Collection Time: 01/06/15  1:59 PM  Result Value Ref Range   Lactic Acid, Venous 2.60 (HH) 0.5 - 2.0 mmol/L   Comment NOTIFIED PHYSICIAN   Basic metabolic panel     Status: Abnormal   Collection Time: 01/06/15  2:50 PM  Result Value Ref Range   Sodium 135 135 - 145 mmol/L   Potassium 3.7 3.5 - 5.1 mmol/L   Chloride 110 101 - 111 mmol/L   CO2 18 (L) 22 - 32 mmol/L   Glucose, Bld 128  (H) 65 - 99 mg/dL   BUN 14 6 - 20 mg/dL   Creatinine, Ser 0.94 0.44 - 1.00 mg/dL   Calcium 7.4 (L) 8.9 - 10.3 mg/dL   GFR calc non Af Amer >60 >60 mL/min   GFR calc Af Amer >60 >60 mL/min   Anion gap 7 5 - 15  Lipase, blood     Status: None   Collection Time: 01/06/15  2:50 PM  Result Value Ref Range   Lipase 39 22 - 51 U/L  Brain natriuretic peptide     Status: Abnormal   Collection Time: 01/06/15  2:50 PM  Result Value Ref Range   B Natriuretic Peptide 108.2 (H) 0.0 - 100.0 pg/mL  Procalcitonin     Status: None   Collection Time: 01/06/15  2:50 PM  Result Value Ref Range   Procalcitonin 40.16 ng/mL  Protime-INR     Status: Abnormal   Collection Time: 01/06/15  2:50 PM  Result Value Ref Range   Prothrombin Time 18.7 (H) 11.6 - 15.2 seconds   INR 1.56 (H) 0.00 - 1.49  APTT     Status: None   Collection Time: 01/06/15  2:50 PM  Result Value Ref Range   aPTT 36 24 - 37 seconds  MRSA PCR Screening     Status: None   Collection Time: 01/06/15  4:38 PM  Result Value Ref Range   MRSA by PCR NEGATIVE NEGATIVE   Recent Results (from the past 240 hour(s))  MRSA PCR Screening     Status: None   Collection Time: 01/06/15  4:38 PM  Result Value Ref Range Status   MRSA by PCR NEGATIVE NEGATIVE Final    Comment:        The GeneXpert MRSA Assay (FDA approved for NASAL specimens only), is one component of a comprehensive MRSA colonization surveillance program. It is not intended to diagnose MRSA infection nor to guide or monitor treatment for MRSA infections.     Renal Function:  Recent Labs  01/06/15 0918 01/06/15 1035 01/06/15 1450  CREATININE 1.3* 1.39* 0.94   Estimated Creatinine Clearance: 73 mL/min (by C-G formula based on Cr of 0.94).  Radiologic Imaging: Dg Chest 2 View  01/06/2015  CLINICAL DATA:  Weakness and fatigue starting last night. Low back pain. Diarrhea. Sepsis. Chemotherapy. Lupus. EXAM: CHEST  2 VIEW COMPARISON:  11/21/2014 FINDINGS: Power  injectable Port-A-Cath tip:  Cavoatrial junction. Low lung volumes are present, causing crowding of the pulmonary vasculature. Bandlike airspace opacity along the right hemidiaphragm favoring atelectasis based on configuration. No pleural effusion  identified. Pigtail drain over the right upper quadrant compatible with biliary drain. IMPRESSION: 1. Bandlike opacity at the right lung base favoring atelectasis based on configuration. 2. Percutaneous biliary drain. 3. Port-A-Cath tip:  Cavoatrial junction. Electronically Signed   By: Van Clines M.D.   On: 01/06/2015 12:02   Ct Abdomen Pelvis W Contrast  01/06/2015  CLINICAL DATA:  Current history of gastric cancer. Fever. Low back pain. EXAM: CT ABDOMEN AND PELVIS WITH CONTRAST TECHNIQUE: Multidetector CT imaging of the abdomen and pelvis was performed using the standard protocol following bolus administration of intravenous contrast. CONTRAST:  161mL OMNIPAQUE IOHEXOL 300 MG/ML  SOLN COMPARISON:  CT scan of November 22, 2014. FINDINGS: Visualized lung bases are unremarkable. No significant osseous abnormality is noted. No gallstones are noted. Percutaneous biliary drainage catheter is again noted and unchanged in position compared to prior exam. No significant change in mild intrahepatic biliary dilatation is noted. Stable splenomegaly is noted. Stable mild pancreatic ductal dilatation is noted. Status post partial gastrectomy. No evidence of bowel obstruction is noted. The appendix appears normal. Adrenal glands and left kidney appear normal. Mild right hydronephrosis and proximal ureteral dilatation is noted ; there is noted an area of abnormal enhancement involving the mid ureter which may represent neoplastic involvement. Stable left ovarian cyst is noted. Stable fibroid is noted in uterine fundus. No abnormal fluid collection is noted. Urinary bladder appears normal. There is continued presence of abnormal soft tissue density seen along the anterior  abdominal wall which is decreased compared to prior exam, consistent with improving peritoneal metastatic focus. Stable soft tissue density is seen in the porta hepatis region consistent with metastatic disease. IMPRESSION: Stable splenomegaly. Percutaneous biliary drainage catheter is unchanged in position compared to prior exam, with no significant change in mild intrahepatic biliary dilatation or pancreatic ductal dilatation. Status post partial gastrectomy. Stable left ovarian cyst is noted compared to prior exam. Continued presence of abnormal soft tissue density seen along the anterior abdominal wall which is decreased compared to prior exam, consistent with improving peritoneal metastatic focus. Stable soft tissue density seen in porta hepatis region consistent with metastatic disease. Interval development of mild right hydronephrosis and proximal ureteral dilatation without obstructing calculus. Enhancing soft tissue abnormality is seen involving the midportion of the right ureter concerning for metastatic disease. Electronically Signed   By: Marijo Conception, M.D.   On: 01/06/2015 13:36    I independently reviewed the above imaging studies.  Impression/Recommendation: Right hydronephrosis: She appears to have right hydronephrosis likely related to metastatic disease with extrinsic compression of the right ureter.  Her UA appears relatively benign although she has a culture pending.  My suspicion for a renal source of her sepsis is low and her renal function is not impaired.  Furthermore, she is not very symptomatic.  Therefore, I do not see a reason for acute intervention of her right hydronephrosis.  As she recovers, she benefit from a Lasix renogram to assess for significant obstruction (which can be performed as an outpatient potentially).  If present, she may benefit from ureteral stent placement to optimize renal function in order to receive optimal systemic therapy for her malignancy going  forward.    Davonna Ertl,LES 01/06/2015, 6:39 PM  Pryor Curia. MD   CC: Dr. Evelina Bucy

## 2015-01-06 NOTE — ED Notes (Signed)
md at bedside  Pt acting more sleepy, needs more arousing to wake up. But alert and oriented x4.

## 2015-01-06 NOTE — ED Notes (Signed)
md at bedside

## 2015-01-06 NOTE — Progress Notes (Signed)
[facility] OFFICE PROGRESS NOTE   Diagnosis: Gastric cancer  INTERVAL HISTORY:   She returns as scheduled. She completed cycle 2 FOLFOX on 12/23/2014. She had less nausea following this cycle of chemotherapy. She continues home TNA and is drinking fluids. She has been up in the home and was able to go out of the house last week. She had an episode of "chills "yesterday evening. This morning she is lethargic. She feels that her legs are "weak ".  Objective:  Vital signs in last 24 hours:  Blood pressure 80/50, pulse 127, temperature 100.5 F (38.1 C), temperature source Oral, resp. rate 30, height 5' 6" (1.676 m), SpO2 100 %.    HEENT: Sclera anicteric, mild whitecoat over the tongue, no buccal thrush or ulcers Resp: Lungs clear bilaterally Cardio: Tachycardia, regular rhythm GI: The abdomen is soft and nontender, no mass, no hepatomegaly, right upper abdomen biliary drain site without tenderness or erythema Vascular: No leg edema Neuro: Lethargic, arousable, follows commands  Skin: The skin turgor appears normal   Portacath/PICC-without erythema  Lab Results:  Lab Results  Component Value Date   WBC 28.4* 01/06/2015   HGB 9.6* 01/06/2015   HCT 29.5* 01/06/2015   MCV 80.4 01/06/2015   PLT 165 01/06/2015   NEUTROABS 25.3* 01/06/2015   Potassium 4.3, CO2 18, BUN 13.2, creatinine 1.3, AST 533, ALT to 50, bilirubin 1.12, alkaline phosphatase 384   Lab Results  Component Value Date   CEA 2.7 11/18/2014     Medications: I have reviewed the patient's current medications.  Assessment/Plan: 1. Gastric cancer status post upper endoscopy 09/22/2013 with findings of a partially obstructing oozing cratered gastric ulcer in the gastric antrum.  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   CT scans chest/abdomen/pelvis 10/03/2013 showed a 7 x 5 mm groundglass opacity in the superior segment of the right lower lobe; a 4.5  mm nodular density in the right upper lobe; bilateral axillary adenopathy; low density area anteriorly in the left hepatic lobe most consistent with fatty infiltration; another hypodense area within the medial segment of the left hepatic lobe; severe gastric distention; mild bilateral inguinal adenopathy.   Subtotal gastrectomy and lymph node dissection with creation of a gastrojejunostomy 10/10/2013. No evidence of distant metastatic disease noted at the time of surgery. No evidence of liver metastases. 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 Tumor involved the serosal surface. Resection margins were negative. 10 of 17 lymph nodes were positive for metastatic adenocarcinoma. Her-2 not amplified   Cycle 1 weekly 5-FU/leucovorin 11/14/2013, dose reduced beginning with week 3 secondary to mucositis and hand/foot syndrome.   Cycle 2 weekly 5-FU/leucovorin beginning 12/19/2013.  Initiation of radiation/Xeloda 01/26/2014. She decided to discontinue further radiation/Xeloda after one fraction due to significant nausea/vomiting.  Cycle 3 weekly 5-FU/leucovorin beginning 02/13/2014  Cycle 4 weekly 5-FU/leucovorin beginning 03/18/2014  Cycle 5 weekly 5-FU/leucovorin beginning 04/28/2014  CTs 06/16/2014 with new enhancing nodular lesions along the ventral peritoneal surface  CT-guided biopsy of an anterior peritoneal lesion on 06/24/2014 confirmed poorly differentiated adenocarcinoma  CTs 09/03/2014 with an increasing confluent enhancing lesion located at the ventral peritoneal surface midline of the anterior abdomen. Lesion inseparable from the posterior aspect of the abdominal wall.  CT abdomen/pelvis 11/18/2014 with significant progression of metastatic disease with enlarging metastasis within the anterior abdominal wall, enlargement of multiple peritoneal implants, interval development of masses within the pancreatic head and caudate lobe of the liver. Mass effect  on the portal vein which  appeared occluded above the pancreatic head and extrahepatic biliary stenosis/obstruction. New complex solid and cystic left adnexal lesion.  Placement of percutaneous internal/external biliary drain 11/19/2014  Cycle 1 FOLFOX 12/09/2014  Cycle 2 FOLFOX 12/23/2014 2. Gastric outlet obstruction secondary to #1. Resolved. 3. Nausea/vomiting secondary to #2. Resolved. 4. Abdominal pain secondary to #1. 5. History of weight loss. 6. Microcytic anemia, likely iron deficiency. She received iron dextran 10/11/2013. 7. Lupus/Sjgren's. 8. Pneumonia during hospitalization July 2015.  9. Abdominal abscesses with body fluid culture 10/20/2013 showing moderate Candida tropicalis. She was discharged home on a 4 week course of fluconazole.  10. Mucositis and hand/foot syndrome following cycle 1-week #3 5-FU/leucovorin, the 5-FU and leucovorin were dose reduced. 11. 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 12. Status post LEEP 11/07/2013. 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 Pap smear 06/08/2014-negative for intraepithelial lesions or malignancy 13. Fever, tachycardia, hypotension 01/06/2015-high clinical suspicion for sepsis   Disposition:  Ms. Homeyer presented today for the scheduled third cycle of FOLFOX chemotherapy. She appears acutely ill today. I have a high clinical suspicion for sepsis. She is at risk for bacterial sepsis with the biliary drain, abdominal/peritoneal tumor, and Port-A-Cath. The markedly elevated white count may be related to sepsis or Neulasta.  I discussed the probable diagnosis of sepsis syndrome with Ms. Shanley and her family. She agrees to transfer to the emergency room for urgent evaluation and treatment.  Further chemotherapy will be placed on hold until she recovers from the acute illness.  SHERRILL, GARY, MD  01/06/2015  10:37 AM    

## 2015-01-06 NOTE — ED Provider Notes (Signed)
CSN: [id number]     Arrival date & time 01/06/15  1015 History   First MD Initiated Contact with Patient 01/06/15 1019     Chief Complaint  Patient presents with  . Code Sepsis     (Consider location/radiation/quality/duration/timing/severity/associated sxs/prior Treatment) HPI Comments: Patient with hx of gastric cancer, presented today for 3rd cycle of FOLFOX chemotherapy. She was hypotensive, febrile, tachycardic. She was sent to the ED for further management.  CBC shows white count of 28K, could be secondary to the Neulasta vs her infection. Hx of intra-abdominal abscesses last year.  Patient is a 44 y.o. female presenting with fever. The history is provided by the patient.  Fever Max temp prior to arrival:  100.5 Temp source:  Oral Severity:  Moderate Onset quality:  Gradual Timing:  Constant Progression:  Unchanged Chronicity:  New Associated symptoms: chills (last night)   Associated symptoms: no cough and no vomiting     Past Medical History  Diagnosis Date  . Eczema   . Lupus (HCC)     joint pain and extreme fatique - improved after plaquenil and prednisone therapy  . Sjogren's disease ([place])     caused pt to loose her teeth due to dry mouth;  prone to eye irritation and infection due to dry eye; affects immune system - PRONE TO INFECTIONS   . Allergy   . Tachycardia   . Protein-calorie malnutrition, severe ([place])   . Abnormal findings on esophagogastroduodenoscopy (EGD) 09/22/13    esophagitis  . Asthma     no recent flare ups - no inhalers - as of 11/19/13  . GERD (gastroesophageal reflux disease)   . Anemia     iron deficiency   . Pneumonia     health care -associated July 2015 post op gastrectomy surgery  . 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   Past Surgical History  Procedure Laterality Date  . Cesarean section      1997  . Laparotomy N/A 10/10/2013    Procedure:  SUBTOTAL GASTRECTOMY WITH GASTRIC JEJUNOSOTMY;  Surgeon: Edward Jolly, MD;  Location: [facility];  Service: General;  Laterality: N/A;  . Portacath placement Right 11/20/2013    Procedure: INSERTION PORT-A-CATH with ULTRA SOUND;  Surgeon: Adin Hector, MD;  Location: [facility];  Service: General;  Laterality: Right;   Family History  Problem Relation Age of Onset  . Hypertension Mother   . Diabetes Mother   . Diabetes Maternal Grandmother   . Diabetes Maternal Grandfather    Social History  Substance Use Topics  . Smoking status: Never Smoker   . Smokeless tobacco: Never Used  . Alcohol Use: Yes     Comment: very occasional- none since feb 2015 when pt got sick   OB History    Gravida Para Term Preterm AB TAB SAB Ectopic Multiple Living   1 1  1             Obstetric Comments   27 weeks, NICU stay. C-section secondary to Pre-E     Review of Systems  Constitutional: Positive for chills (last night). Negative for fever.  Respiratory: Negative for cough and shortness of breath.   Gastrointestinal: Negative for vomiting and abdominal pain.  All other systems reviewed and are negative.     Allergies  Compazine; Azithromycin; Sulfa antibiotics; Hydrocodone-acetaminophen; and Other  Home Medications   Prior to Admission medications   Medication Sig Start  Date End Date Taking? Authorizing Provider  ALOE VERA JUICE PO Take 30 mLs by mouth daily.    Historical Provider, MD  cephALEXin (KEFLEX) 500 MG capsule Take 1 capsule (500 mg total) by mouth 2 (two) times daily. Patient not taking: Reported on 12/24/2014 11/22/14   Linton Flemings, MD  CHLOROPHYLL-ALFALFA PO Take 5 mLs by mouth daily.    Historical Provider, MD  dexamethasone (DECADRON) 4 MG tablet Take 2 tablets (8 mg total) by mouth 2 (two) times daily. Take 8 mg twice a day for two days beginning the day after chemotherapy. 12/23/14   Owens Shark, NP  ferrous sulfate 325 (65 FE) MG tablet Take 325 mg by mouth 2 (two) times  daily with a meal.    Historical Provider, MD  hydroxychloroquine (PLAQUENIL) 200 MG tablet Take 1 tablet (200 mg total) by mouth 2 (two) times daily. Patient not taking: Reported on 12/24/2014 09/17/14   Ladell Pier, MD  ibuprofen (ADVIL,MOTRIN) 200 MG tablet Take 200-600 mg by mouth every 6 (six) hours as needed for headache or moderate pain.    Historical Provider, MD  lidocaine-prilocaine (EMLA) cream Appy small amount of cream over port site 1-2 hours prior to treatment and cover with plastic wrap.  DO NOT RUB IN Patient taking differently: Apply 1 application topically daily as needed. Appy small amount of cream over port site 1-2 hours prior to treatment and cover with plastic wrap.  DO NOT RUB IN 03/18/14   Owens Shark, NP  LORazepam (ATIVAN) 0.5 MG tablet Take 1 tablet (0.5 mg total) by mouth every 8 (eight) hours as needed (nausea). May take oral or sublingual every 8 hours as needed for nausea 12/23/14   Owens Shark, NP  morphine (MS CONTIN) 15 MG 12 hr tablet Take 1 tablet (15 mg total) by mouth every 12 (twelve) hours. 12/23/14   Owens Shark, NP  ondansetron (ZOFRAN) 4 MG tablet Take 1 tablet (4 mg total) by mouth every 8 (eight) hours as needed for nausea or vomiting. 12/02/14   Owens Shark, NP  OVER THE COUNTER MEDICATION Take 30 mLs by mouth daily. [place] go tea supplyment    Historical Provider, MD  oxyCODONE-acetaminophen (PERCOCET/ROXICET) 5-325 MG per tablet Take 1 tablet by mouth every 4 (four) hours as needed for severe pain. 12/16/14   Owens Shark, NP   BP 83/40 mmHg  Pulse 110  Temp(Src) 100.9 F (38.3 C) (Rectal)  Resp 18  Wt 138 lb (62.596 kg)  SpO2 98%  LMP 11/23/2014 (Approximate) Physical Exam  Constitutional: She is oriented to person, place, and time. She appears well-developed and well-nourished. No distress.  HENT:  Head: Normocephalic and atraumatic.  Mouth/Throat: Oropharynx is clear and moist.  Eyes: EOM are normal. Pupils are equal, round, and  reactive to light.  Neck: Normal range of motion. Neck supple.  Cardiovascular: Normal rate and regular rhythm.  Exam reveals no friction rub.   No murmur heard. Pulmonary/Chest: Effort normal and breath sounds normal. No respiratory distress. She has no wheezes. She has no rales.  Abdominal: Soft. She exhibits no distension. There is no tenderness. There is no rebound.  RUQ drain, c/d/i, no drainage or weeping  Musculoskeletal: Normal range of motion. She exhibits no edema.  Neurological: She is alert and oriented to person, place, and time. No cranial nerve deficit. She exhibits normal muscle tone. Coordination normal.  Skin: No rash noted. She is not diaphoretic.  Nursing note and vitals  reviewed.   ED Course  Procedures (including critical care time) Labs Review Labs Reviewed  I-STAT CG4 LACTIC ACID, ED - Abnormal; Notable for the following:    Lactic Acid, Venous 4.61 (*)    All other components within normal limits  CULTURE, BLOOD (ROUTINE X 2)  CULTURE, BLOOD (ROUTINE X 2)  URINE CULTURE  COMPREHENSIVE METABOLIC PANEL  URINALYSIS, ROUTINE W REFLEX MICROSCOPIC (NOT AT [facility])  CBC WITH DIFFERENTIAL/PLATELET    Imaging Review Dg Chest 2 View  01/06/2015  CLINICAL DATA:  Weakness and fatigue starting last night. Low back pain. Diarrhea. Sepsis. Chemotherapy. Lupus. EXAM: CHEST  2 VIEW COMPARISON:  11/21/2014 FINDINGS: Power injectable Port-A-Cath tip:  Cavoatrial junction. Low lung volumes are present, causing crowding of the pulmonary vasculature. Bandlike airspace opacity along the right hemidiaphragm favoring atelectasis based on configuration. No pleural effusion identified. Pigtail drain over the right upper quadrant compatible with biliary drain. IMPRESSION: 1. Bandlike opacity at the right lung base favoring atelectasis based on configuration. 2. Percutaneous biliary drain. 3. Port-A-Cath tip:  Cavoatrial junction. Electronically Signed   By: Van Clines M.D.   On:  01/06/2015 12:02   Ct Abdomen Pelvis W Contrast  01/06/2015  CLINICAL DATA:  Current history of gastric cancer. Fever. Low back pain. EXAM: CT ABDOMEN AND PELVIS WITH CONTRAST TECHNIQUE: Multidetector CT imaging of the abdomen and pelvis was performed using the standard protocol following bolus administration of intravenous contrast. CONTRAST:  156mL OMNIPAQUE IOHEXOL 300 MG/ML  SOLN COMPARISON:  CT scan of November 22, 2014. FINDINGS: Visualized lung bases are unremarkable. No significant osseous abnormality is noted. No gallstones are noted. Percutaneous biliary drainage catheter is again noted and unchanged in position compared to prior exam. No significant change in mild intrahepatic biliary dilatation is noted. Stable splenomegaly is noted. Stable mild pancreatic ductal dilatation is noted. Status post partial gastrectomy. No evidence of bowel obstruction is noted. The appendix appears normal. Adrenal glands and left kidney appear normal. Mild right hydronephrosis and proximal ureteral dilatation is noted ; there is noted an area of abnormal enhancement involving the mid ureter which may represent neoplastic involvement. Stable left ovarian cyst is noted. Stable fibroid is noted in uterine fundus. No abnormal fluid collection is noted. Urinary bladder appears normal. There is continued presence of abnormal soft tissue density seen along the anterior abdominal wall which is decreased compared to prior exam, consistent with improving peritoneal metastatic focus. Stable soft tissue density is seen in the porta hepatis region consistent with metastatic disease. IMPRESSION: Stable splenomegaly. Percutaneous biliary drainage catheter is unchanged in position compared to prior exam, with no significant change in mild intrahepatic biliary dilatation or pancreatic ductal dilatation. Status post partial gastrectomy. Stable left ovarian cyst is noted compared to prior exam. Continued presence of abnormal soft tissue  density seen along the anterior abdominal wall which is decreased compared to prior exam, consistent with improving peritoneal metastatic focus. Stable soft tissue density seen in porta hepatis region consistent with metastatic disease. Interval development of mild right hydronephrosis and proximal ureteral dilatation without obstructing calculus. Enhancing soft tissue abnormality is seen involving the midportion of the right ureter concerning for metastatic disease. Electronically Signed   By: Marijo Conception, M.D.   On: 01/06/2015 13:36   I have personally reviewed and evaluated these images and lab results as part of my medical decision-making.   EKG Interpretation None      CRITICAL CARE Performed by: Osvaldo Shipper   Total critical care time:  45 minutes  Critical care time was exclusive of separately billable procedures and treating other patients.  Critical care was necessary to treat or prevent imminent or life-threatening deterioration.  Critical care was time spent personally by me on the following activities: development of treatment plan with patient and/or surrogate as well as nursing, discussions with consultants, evaluation of patient's response to treatment, examination of patient, obtaining history from patient or surrogate, ordering and performing treatments and interventions, ordering and review of laboratory studies, ordering and review of radiographic studies, pulse oximetry and re-evaluation of patient's condition.  <ECG>  ED ECG REPORT   Date: 01/06/2015  Rate: 96  Rhythm: normal sinus rhythm  QRS Axis: normal  Intervals: normal  ST/T Wave abnormalities: normal and mild flipped T waves anteriorly, simiar to prior  Conduction Disutrbances:none  Narrative Interpretation:   Old EKG Reviewed: none available  I have personally reviewed the EKG tracing and agree with the computerized printout as noted.  MDM   Final diagnoses:  Sepsis, due to unspecified  organism ([place])  Septic shock ([place])  Hypotension, unspecified hypotension type    44 year old female with history of gastric cancer resents from the oncology office for concerns of sepsis. She is hypotensive, tachycardic, febrile. Here she is ill-appearing, but MENTATING. Her lactates 4.6. Pressures mildly improving fluids. Her port has been accessed for 5 days by home health nurses for TPN. Will remove the port and reaccess the port and draw blood cultures. Patient denies any abdominal pain. She has right upper quadrant biliary drain is well-appearing.  I spoke with Dr. Malachy Mood, her oncologist, he was worried about possible intra-abdominal process, and I was also. CT is negative for infectious processes, however has some mild R hydronephrosis with concern for metastatic spread putting pressure on the ureter. After fluid resuscitation, lactate has trended down, however blood pressures are still the low 98X systolics. Put on levophed for pressure support.  Patient admitted to the ICU.  I spoke with Dr. Alinda Money from urology as her scan was concerning for possible right-sided hydronephrosis due to metastatic disease. Urology will see the patient.    Evelina Bucy, MD 01/06/15 [phone]

## 2015-01-07 ENCOUNTER — Encounter: Payer: Self-pay | Admitting: *Deleted

## 2015-01-07 DIAGNOSIS — E440 Moderate protein-calorie malnutrition: Secondary | ICD-10-CM | POA: Insufficient documentation

## 2015-01-07 LAB — CBC
HCT: 24.4 % — ABNORMAL LOW (ref 36.0–46.0)
HEMOGLOBIN: 8 g/dL — AB (ref 12.0–15.0)
MCH: 26.8 pg (ref 26.0–34.0)
MCHC: 32.8 g/dL (ref 30.0–36.0)
MCV: 81.6 fL (ref 78.0–100.0)
Platelets: 143 10*3/uL — ABNORMAL LOW (ref 150–400)
RBC: 2.99 MIL/uL — AB (ref 3.87–5.11)
RDW: 16.5 % — ABNORMAL HIGH (ref 11.5–15.5)
WBC: 31.2 10*3/uL — ABNORMAL HIGH (ref 4.0–10.5)

## 2015-01-07 LAB — BASIC METABOLIC PANEL
ANION GAP: 5 (ref 5–15)
BUN: 12 mg/dL (ref 6–20)
CHLORIDE: 115 mmol/L — AB (ref 101–111)
CO2: 21 mmol/L — ABNORMAL LOW (ref 22–32)
Calcium: 7.9 mg/dL — ABNORMAL LOW (ref 8.9–10.3)
Creatinine, Ser: 0.73 mg/dL (ref 0.44–1.00)
GFR calc Af Amer: >60 mL/min (ref >60)
GLUCOSE: 129 mg/dL — AB (ref 65–99)
POTASSIUM: 3.7 mmol/L (ref 3.5–5.1)
Sodium: 141 mmol/L (ref 135–145)

## 2015-01-07 LAB — PHOSPHORUS: PHOSPHORUS: 3.1 mg/dL (ref 2.5–4.6)

## 2015-01-07 LAB — URINE CULTURE: Culture: NO GROWTH

## 2015-01-07 LAB — HEPATIC FUNCTION PANEL
ALBUMIN: 2.2 g/dL — AB (ref 3.5–5.0)
ALT: 143 U/L — AB (ref 14–54)
AST: 206 U/L — AB (ref 15–41)
Alkaline Phosphatase: 245 U/L — ABNORMAL HIGH (ref 38–126)
BILIRUBIN DIRECT: 0.2 mg/dL (ref 0.1–0.5)
Indirect Bilirubin: 0.6 mg/dL (ref 0.3–0.9)
Total Bilirubin: 0.8 mg/dL (ref 0.3–1.2)
Total Protein: 6.7 g/dL (ref 6.5–8.1)

## 2015-01-07 LAB — GRAM STAIN

## 2015-01-07 LAB — AMMONIA: AMMONIA: 37 umol/L — AB (ref 9–35)

## 2015-01-07 LAB — NO BLOOD PRODUCTS

## 2015-01-07 LAB — MAGNESIUM: MAGNESIUM: 1.7 mg/dL (ref 1.7–2.4)

## 2015-01-07 MED ORDER — MAGNESIUM SULFATE 2 GM/50ML IV SOLN
2.0000 g | Freq: Once | INTRAVENOUS | Status: AC
  Administered 2015-01-07: 2 g via INTRAVENOUS
  Filled 2015-01-07: qty 50

## 2015-01-07 MED ORDER — ONDANSETRON HCL 4 MG PO TABS
4.0000 mg | ORAL_TABLET | Freq: Three times a day (TID) | ORAL | Status: DC | PRN
  Administered 2015-01-07 – 2015-01-11 (×5): 4 mg via ORAL
  Filled 2015-01-07 (×5): qty 1

## 2015-01-07 MED ORDER — MORPHINE SULFATE ER 15 MG PO TBCR
15.0000 mg | EXTENDED_RELEASE_TABLET | Freq: Two times a day (BID) | ORAL | Status: DC
  Administered 2015-01-07 – 2015-01-11 (×9): 15 mg via ORAL
  Filled 2015-01-07 (×9): qty 1

## 2015-01-07 MED ORDER — BOOST / RESOURCE BREEZE PO LIQD
1.0000 | Freq: Two times a day (BID) | ORAL | Status: DC
  Administered 2015-01-07 – 2015-01-11 (×7): 1 via ORAL

## 2015-01-07 MED ORDER — POTASSIUM CHLORIDE 10 MEQ/50ML IV SOLN
10.0000 meq | INTRAVENOUS | Status: AC
  Administered 2015-01-07 (×2): 10 meq via INTRAVENOUS
  Filled 2015-01-07 (×2): qty 50

## 2015-01-07 NOTE — Progress Notes (Signed)
Initial Nutrition Assessment  DOCUMENTATION CODES:   Non-severe (moderate) malnutrition in context of chronic illness  INTERVENTION:  - TPN per pharmacy - Diet advancement as medically feasible - Will order Boost Breeze BID, each supplement provides 250 kcal and 9 grams of protein in anticipation of diet advancement during admission - RD will continue to monitor for needs  NUTRITION DIAGNOSIS:   Inadequate oral intake related to inability to eat as evidenced by NPO status.  GOAL:   Patient will meet greater than or equal to 90% of their needs  MONITOR:   Diet advancement, Weight trends, Labs, Skin, I & O's, Other (Comment) (TPN regimen)  REASON FOR ASSESSMENT:   Malnutrition Screening Tool, Other (Comment) (new TPN)  ASSESSMENT:   44 yo aaf with a PMH consistent with gastric cancer s/p resection with mets tx with chemo and recent external biliary drain placement for obstruction on 9/29 [facility]12 c/o chills and lethargy. Pt presented to oncology clinic on 10/12 for usually scheduled chemo treatment and was found to be hypotensive, tachycardic with a fever of 38.1. PCCM asked to admit for sepsis   Pt seen for MST and home TPN. BMI indicates normal weight status. Per MD note, TPN currently on hold pending cultures. Per Dr. Gearldine Shown note yesterday (10/12), pt had subtotal gastrectomy and lymph node dissection with creation of a gastrojejunostomy 10/10/2013. Pt reports that she has been on TPN for 1.5 months and that lipids were recently added to her regimen. She was initially receiving TPN over 24 hours but it was decreased to 16 hour run time and the plan was to again decrease to 14 hours in the near future; she states MD had informed her of plan to continue to wean TPN until it could be d/c'ed and she could meet needs PO. At home she was eating mainly soft foods and avoiding meat products as she is vegetarian. She would snack throughout the day rather than eating  scheduled meals. She was recently provided with Boost Breeze by [facility] RD and liked this supplement although it was too sweet for her at times; encouraged her to dilute it with water and she states she will try this. She does not like Ensure or Boost Plus.   Pt denies abdominal pain or nausea this AM. Pt she was not nauseated with intakes as long as she took Zofran prior to PO intakes. She states that she had lost weight but has been regaining weight since initiation of TPN and ability to consume more PO. She states lowest weight was 129 lbs. Per chart review, pt has lost 16 lbs (10% body weight) in the past 6 months which is significant for time frame. Mild fat and moderate muscle wasting to upper body also noted.   Pt is unsure of composition of TPN she received at home. Family member in the room offered to call granddaughter who stated bag was 15/60 but she was unsure of lipids; question accuracy of this. Pt also unsure of rate at which TPN was run. Awaiting return call from [facility] RD to determine how to locate this information.   Not meeting needs. Medications reviewed. Labs reviewed; Cl: 115 mmol/L, Ca: 7.9 mg/dL, LFTs elevated (will monitor LFT trends with TPN re-start).    Diet Order:  TPN ADULT Diet regular Room service appropriate?: Yes; Fluid consistency:: Thin  Skin:  Reviewed, no issues  Last BM:  PTA  Height:   Ht Readings from Last 1 Encounters:  01/06/15 5\' 6"  (1.676  m)    Weight:   Wt Readings from Last 1 Encounters:  01/07/15 145 lb 1 oz (65.8 kg)    Ideal Body Weight:  59.09 kg (kg)  BMI:  Body mass index is 23.42 kg/(m^2).  Estimated Nutritional Needs:   Kcal:  1975-2300  Protein:  75-85 grams  Fluid:  2-2.2 L/day  EDUCATION NEEDS:   No education needs identified at this time     Jarome Matin, RD, LDN Inpatient Clinical Dietitian Pager # [phone] After hours/weekend pager # [phone]

## 2015-01-07 NOTE — Progress Notes (Signed)
Oncology Nurse Navigator Documentation  Oncology Nurse Navigator Flowsheets 01/07/2015  Navigator Encounter Type 6 month  Patient Visit Type Inpatient  Treatment Phase Treatment-s/p FOLFOX #2  Barriers/Navigation Needs No barriers at this time  Interventions None required--already has home health set up for TPN prior to admission  Coordination of Care -  Time Spent with Patient 15  Brief visit with [facility] in hospital. Feeling much better-fever has resolved. Biliary drain patent. Encouraged her to call with any questions/needs.

## 2015-01-07 NOTE — Progress Notes (Signed)
PULMONARY / CRITICAL CARE MEDICINE   Name: Andrea Best MRN: [id number] DOB: 01/19/1971    ADMISSION DATE:  01/06/2015 CONSULTATION DATE:  01/06/15  REFERRING MD :  Mingo Amber  CHIEF COMPLAINT:  Sepsis   INITIAL PRESENTATION:  Pt is a 44 yo aaf with a PMH consistent with gastric cancer s/p resection with mets tx with chemo and recent external biliary drain placement for obstruction on 9/29 presents to [facility] 10/12 c/o chills and lethargy. Pt presented to oncology clinic on 10/12 for usually scheduled chemo treatment and was found to be hypotensive, tachycardic with a fever of 38.1. PCCM asked to admit for sepsis   STUDIES:  10/12 CT abd/pelvis > Rt hydronephrosis with ureteral dilation w/o obstructing stone. Questionable soft-tissue enhancement suggesting metastatic involvement   SIGNIFICANT EVENTS: 10/12 > Admitted to ICU for sepsis  10/13 improved and changed to SDU status   HISTORY OF PRESENT ILLNESS:  Pt is a 44 yo aaf with a PMH consistent with lupus, sjogren's disease, asthma, GERD, anemia, and gastric cancer s/p resection with metastasis currently being treated with chemo. Pt had recent external biliary drain placement for biliary obstruction on 9/29 and now presents to [facility] 10/12 c/o chills and lethargy. Pt was in [place] until late 10/11 when she developed chills.  Pt presented to oncology clinic on 10/12 for usually scheduled chemo treatment and was found to be hypotensive, tachycardic with a fever of 38.1. She was sent to [facility] for further treatment.  PCCM asked to admit for sepsis.     SUBJECTIVE:  Much improved  VITAL SIGNS: Temp:  [98.3 F (36.8 C)-100.9 F (38.3 C)] 98.6 F (37 C) (10/13 0800) Pulse Rate:  [62-127] 88 (10/13 0830) Resp:  [0-31] 21 (10/13 0830) BP: (74-132)/(40-88) 82/64 mmHg (10/13 0830) SpO2:  [95 %-100 %] 100 % (10/13 0830) Weight:  [138 lb (62.596 kg)-145 lb 1 oz (65.8 kg)] 145 lb 1 oz (65.8 kg) (10/13 0317) HEMODYNAMICS:   VENTILATOR  SETTINGS:   INTAKE / OUTPUT:  Intake/Output Summary (Last 24 hours) at 01/07/15 5916 Last data filed at 01/07/15 0840  Gross per 24 hour  Intake 5892.79 ml  Output   2005 ml  Net 3887.79 ml    PHYSICAL EXAMINATION: General: Alert and looking much improved Neuro:  A&Ox4, no focal deficits  HEENT: NCAT, MM dry  Cardiovascular: RRR  Lungs: Lungs CTA bilaterally  Abdomen:  Soft, non-tender, non-distended.Bilary drain with bile Musculoskeletal: Intact, no edema  Skin: Intact   LABS:  CBC  Recent Labs Lab 01/06/15 0918 01/06/15 1035 01/07/15 0300  WBC 28.4* 27.9* 31.2*  HGB 9.6* 9.4* 8.0*  HCT 29.5* 28.4* 24.4*  PLT 165 146* 143*   Coag's  Recent Labs Lab 01/06/15 1450  APTT 36  INR 1.56*   BMET  Recent Labs Lab 01/06/15 1035 01/06/15 1450 01/07/15 0300  NA 133* 135 141  K 5.4* 3.7 3.7  CL 101 110 115*  CO2 20* 18* 21*  BUN 17 14 12   CREATININE 1.39* 0.94 0.73  GLUCOSE 131* 128* 129*   Electrolytes  Recent Labs Lab 01/06/15 1035 01/06/15 1450 01/07/15 0300  CALCIUM 8.7* 7.4* 7.9*  MG  --   --  1.7  PHOS  --   --  3.1   Sepsis Markers  Recent Labs Lab 01/06/15 1042 01/06/15 1359 01/06/15 1450  LATICACIDVEN 4.61* 2.60*  --   PROCALCITON  --   --  40.16   ABG No results for input(s): PHART, PCO2ART, PO2ART in the  last 168 hours. Liver Enzymes  Recent Labs Lab 01/06/15 0918 01/06/15 1035 01/07/15 0300  AST 533* 536* 206*  ALT 250* 239* 143*  ALKPHOS 384* 341* 245*  BILITOT 1.12 2.1* 0.8  ALBUMIN 2.5* 2.6* 2.2*   Cardiac Enzymes No results for input(s): TROPONINI, PROBNP in the last 168 hours. Glucose No results for input(s): GLUCAP in the last 168 hours.  Imaging Dg Chest 2 View  01/06/2015  CLINICAL DATA:  Weakness and fatigue starting last night. Low back pain. Diarrhea. Sepsis. Chemotherapy. Lupus. EXAM: CHEST  2 VIEW COMPARISON:  11/21/2014 FINDINGS: Power injectable Port-A-Cath tip:  Cavoatrial junction. Low lung  volumes are present, causing crowding of the pulmonary vasculature. Bandlike airspace opacity along the right hemidiaphragm favoring atelectasis based on configuration. No pleural effusion identified. Pigtail drain over the right upper quadrant compatible with biliary drain. IMPRESSION: 1. Bandlike opacity at the right lung base favoring atelectasis based on configuration. 2. Percutaneous biliary drain. 3. Port-A-Cath tip:  Cavoatrial junction. Electronically Signed   By: Van Clines M.D.   On: 01/06/2015 12:02   Ct Abdomen Pelvis W Contrast  01/06/2015  CLINICAL DATA:  Current history of gastric cancer. Fever. Low back pain. EXAM: CT ABDOMEN AND PELVIS WITH CONTRAST TECHNIQUE: Multidetector CT imaging of the abdomen and pelvis was performed using the standard protocol following bolus administration of intravenous contrast. CONTRAST:  148mL OMNIPAQUE IOHEXOL 300 MG/ML  SOLN COMPARISON:  CT scan of November 22, 2014. FINDINGS: Visualized lung bases are unremarkable. No significant osseous abnormality is noted. No gallstones are noted. Percutaneous biliary drainage catheter is again noted and unchanged in position compared to prior exam. No significant change in mild intrahepatic biliary dilatation is noted. Stable splenomegaly is noted. Stable mild pancreatic ductal dilatation is noted. Status post partial gastrectomy. No evidence of bowel obstruction is noted. The appendix appears normal. Adrenal glands and left kidney appear normal. Mild right hydronephrosis and proximal ureteral dilatation is noted ; there is noted an area of abnormal enhancement involving the mid ureter which may represent neoplastic involvement. Stable left ovarian cyst is noted. Stable fibroid is noted in uterine fundus. No abnormal fluid collection is noted. Urinary bladder appears normal. There is continued presence of abnormal soft tissue density seen along the anterior abdominal wall which is decreased compared to prior exam,  consistent with improving peritoneal metastatic focus. Stable soft tissue density is seen in the porta hepatis region consistent with metastatic disease. IMPRESSION: Stable splenomegaly. Percutaneous biliary drainage catheter is unchanged in position compared to prior exam, with no significant change in mild intrahepatic biliary dilatation or pancreatic ductal dilatation. Status post partial gastrectomy. Stable left ovarian cyst is noted compared to prior exam. Continued presence of abnormal soft tissue density seen along the anterior abdominal wall which is decreased compared to prior exam, consistent with improving peritoneal metastatic focus. Stable soft tissue density seen in porta hepatis region consistent with metastatic disease. Interval development of mild right hydronephrosis and proximal ureteral dilatation without obstructing calculus. Enhancing soft tissue abnormality is seen involving the midportion of the right ureter concerning for metastatic disease. Electronically Signed   By: Marijo Conception, M.D.   On: 01/06/2015 13:36     ASSESSMENT / PLAN:  PULMONARY A: Rt sided ATX  P:   Pulmonary hygiene  IS    CARDIOVASCULAR A:  Hypotension r/t septic shock + volume depletion , resolved 10-13 P:  Levophed gtt off MAP goal >65  See ID   RENAL Lab Results  Component  Value Date   CREATININE 0.73 01/07/2015   CREATININE 0.94 01/06/2015   CREATININE 1.39* 01/06/2015   CREATININE 1.3* 01/06/2015   CREATININE 0.9 12/23/2014   CREATININE 0.9 12/09/2014   CREATININE 0.87 11/26/2014   CREATININE 0.63 05/26/2013    A:   Right-sided mild Renal Hydronephrosis  AKI > Could be pre-renal in the setting of sepsis or post-renal d/t metastatic involvement of Rt ureter Anion Gap Metabolic Acidosis > Lactic --> clearing with NS P:   Consulted Urology and IR, no interventions indicated at this time. Will need to consider r ureteral stent at some point depending on progress. Appreciate Dr  Lynne Logan assistance IVF- NS  Monitor UO   GASTROINTESTINAL A:  Gastric Cancer  Biliary Drain s/p biliary obstruction  Severe Protein Calorie Malnutrition  P:   Consult IR to draw culture from biliary drain  Hold TPN for now until cultures result   HEMATOLOGIC A:  Chronic Iron Deficiency Anemia  Thrombocytopenia  P:  Trend CBC  Limit blood draws d/t patient's religious beliefs (Jehovah's witness)  DVT prophylaxis with heparin   INFECTIOUS/AUTOIMMUNE A:   GNR bacteremia Septic Shock source unclear, but suspect biliary drain.  Pt with multiple potential etiologies including port-a-cath, biliary drain, and new hydronephrosis  H/o lupus    P:   BCx2 10/12>>>GNR>> UC 10/12>>>neg Biliary Drain Culture 10/12 >> GVR>> Vancomycin 10/12>>>dc vanc 10/13 but low threshold to resume if she becomes toxic again Zosyn 10/12 >> Holding plaquenil, restart in next few days if continues to improve on abx  ENDOCRINE A:   Probable adrenal insufficiency d/t chronic steroid use  P:   Solu-cortef 50 mg , stop 10/13  NEUROLOGIC A:   No acute  P:   Monitor     FAMILY  - Updates: discussed plans with patient and mother at bedside.   - Inter-disciplinary family meet or Palliative Care meeting due by: 10/19   TODAY'S SUMMARY: 44 yo gastric cancer and GNR sepsis who is immensely improved over 24 hours. She has responded to fluids and abx. No urological interventions required. We will change to SDU status and transfer to triad service in am   [facility] Minor ACNP Maryanna Shape PCCM Pager [phone] till 3 pm If no answer page [phone] 01/07/2015, 9:26 AM  Attending Note:  I have examined patient, reviewed labs, studies and notes. I have discussed the case with S Minor, and I agree with the data and plans as amended above. I will d/c hydrocort, continue to hold plaquanil. Narrow abx given GNR bacteremia. Await speciation. Suspect the biliary drain is the source. Appreciate Dr Lynne Logan help.    Baltazar Apo, MD, PhD 01/07/2015, 11:35 AM [facility] [phone] or if no answer [phone]

## 2015-01-07 NOTE — Progress Notes (Signed)
IP PROGRESS NOTE  Subjective:   She was admitted yesterday with clinical evidence of septicemia. She reports feeling better today.  Objective: Vital signs in last 24 hours: Blood pressure 109/73, pulse 103, temperature 98.1 F (36.7 C), temperature source Oral, resp. rate 20, height _0  (1.676 m), weight 145 lb 1 oz (65.8 kg), last menstrual period 11/23/2014, SpO2 100 %.  Intake/Output from previous day: 10/12 0701 - 10/13 0700 In: 5617.8 [I.V.:5417.8; IV Piggyback:200] Out: 7741 [Urine:1050; Drains:555]  Physical Exam:  HEENT: No thrush Lungs: Clear bilaterally Cardiac: Regular rate and rhythm Abdomen: Soft and nontender, no mass, right upper abdomen drain site without evidence of infection Extremities: No leg edema Neurologic: Alert and oriented  Portacath/PICC-without erythema  Lab Results:  Recent Labs  01/06/15 1035 01/07/15 0300  WBC 27.9* 31.2*  HGB 9.4* 8.0*  HCT 28.4* 24.4*  PLT 146* 143*    BMET  Recent Labs  01/06/15 1450 01/07/15 0300  NA 135 141  K 3.7 3.7  CL 110 115*  CO2 18* 21*  GLUCOSE 128* 129*  BUN 14 12  CREATININE 0.94 0.73  CALCIUM 7.4* 7.9*    Studies/Results: Dg Chest 2 View  01/06/2015  CLINICAL DATA:  Weakness and fatigue starting last night. Low back pain. Diarrhea. Sepsis. Chemotherapy. Lupus. EXAM: CHEST  2 VIEW COMPARISON:  11/21/2014 FINDINGS: Power injectable Port-A-Cath tip:  Cavoatrial junction. Low lung volumes are present, causing crowding of the pulmonary vasculature. Bandlike airspace opacity along the right hemidiaphragm favoring atelectasis based on configuration. No pleural effusion identified. Pigtail drain over the right upper quadrant compatible with biliary drain. IMPRESSION: 1. Bandlike opacity at the right lung base favoring atelectasis based on configuration. 2. Percutaneous biliary drain. 3. Port-A-Cath tip:  Cavoatrial junction. Electronically Signed   By: Van Clines M.D.   On: 01/06/2015 12:02    Ct Abdomen Pelvis W Contrast  01/06/2015  CLINICAL DATA:  Current history of gastric cancer. Fever. Low back pain. EXAM: CT ABDOMEN AND PELVIS WITH CONTRAST TECHNIQUE: Multidetector CT imaging of the abdomen and pelvis was performed using the standard protocol following bolus administration of intravenous contrast. CONTRAST:  186m OMNIPAQUE IOHEXOL 300 MG/ML  SOLN COMPARISON:  CT scan of November 22, 2014. FINDINGS: Visualized lung bases are unremarkable. No significant osseous abnormality is noted. No gallstones are noted. Percutaneous biliary drainage catheter is again noted and unchanged in position compared to prior exam. No significant change in mild intrahepatic biliary dilatation is noted. Stable splenomegaly is noted. Stable mild pancreatic ductal dilatation is noted. Status post partial gastrectomy. No evidence of bowel obstruction is noted. The appendix appears normal. Adrenal glands and left kidney appear normal. Mild right hydronephrosis and proximal ureteral dilatation is noted ; there is noted an area of abnormal enhancement involving the mid ureter which may represent neoplastic involvement. Stable left ovarian cyst is noted. Stable fibroid is noted in uterine fundus. No abnormal fluid collection is noted. Urinary bladder appears normal. There is continued presence of abnormal soft tissue density seen along the anterior abdominal wall which is decreased compared to prior exam, consistent with improving peritoneal metastatic focus. Stable soft tissue density is seen in the porta hepatis region consistent with metastatic disease. IMPRESSION: Stable splenomegaly. Percutaneous biliary drainage catheter is unchanged in position compared to prior exam, with no significant change in mild intrahepatic biliary dilatation or pancreatic ductal dilatation. Status post partial gastrectomy. Stable left ovarian cyst is noted compared to prior exam. Continued presence of abnormal soft tissue density seen along  the anterior abdominal wall which is decreased compared to prior exam, consistent with improving peritoneal metastatic focus. Stable soft tissue density seen in porta hepatis region consistent with metastatic disease. Interval development of mild right hydronephrosis and proximal ureteral dilatation without obstructing calculus. Enhancing soft tissue abnormality is seen involving the midportion of the right ureter concerning for metastatic disease. Electronically Signed   By: Marijo Conception, M.D.   On: 01/06/2015 13:36    Medications: I have reviewed the patient's current medications.  Assessment/Plan:  1. Gastric cancer status post upper endoscopy 09/22/2013 with findings of a partially obstructing oozing cratered gastric ulcer in the gastric antrum.  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   CT scans chest/abdomen/pelvis 10/03/2013 showed a 7 x 5 mm groundglass opacity in the superior segment of the right lower lobe; a 4.5 mm nodular density in the right upper lobe; bilateral axillary adenopathy; low density area anteriorly in the left hepatic lobe most consistent with fatty infiltration; another hypodense area within the medial segment of the left hepatic lobe; severe gastric distention; mild bilateral inguinal adenopathy.   Subtotal gastrectomy and lymph node dissection with creation of a gastrojejunostomy 10/10/2013. No evidence of distant metastatic disease noted at the time of surgery. No evidence of liver metastases. 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 Tumor involved the serosal surface. Resection margins were negative. 10 of 17 lymph nodes were positive for metastatic adenocarcinoma. Her-2 not amplified   Cycle 1 weekly 5-FU/leucovorin 11/14/2013, dose reduced beginning with week 3 secondary to mucositis and hand/foot syndrome.   Cycle 2 weekly 5-FU/leucovorin beginning 12/19/2013.  Initiation of  radiation/Xeloda 01/26/2014. She decided to discontinue further radiation/Xeloda after one fraction due to significant nausea/vomiting.  Cycle 3 weekly 5-FU/leucovorin beginning 02/13/2014  Cycle 4 weekly 5-FU/leucovorin beginning 03/18/2014  Cycle 5 weekly 5-FU/leucovorin beginning 04/28/2014  CTs 06/16/2014 with new enhancing nodular lesions along the ventral peritoneal surface  CT-guided biopsy of an anterior peritoneal lesion on 06/24/2014 confirmed poorly differentiated adenocarcinoma  CTs 09/03/2014 with an increasing confluent enhancing lesion located at the ventral peritoneal surface midline of the anterior abdomen. Lesion inseparable from the posterior aspect of the abdominal wall.  CT abdomen/pelvis 11/18/2014 with significant progression of metastatic disease with enlarging metastasis within the anterior abdominal wall, enlargement of multiple peritoneal implants, interval development of masses within the pancreatic head and caudate lobe of the liver. 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 New complex solid and cystic left adnexal lesion.  Placement of percutaneous internal/external biliary drain 11/19/2014  Cycle 1 FOLFOX 12/09/2014  Cycle 2 FOLFOX 12/23/2014  CT 01/06/2015 with a decrease in the anterior abdominal wall tumor 2. Gastric outlet obstruction secondary to #1. Resolved. 3. Nausea/vomiting secondary to #2. Resolved. 4. Abdominal pain secondary to #1. 5. History of weight loss. 6. Microcytic anemia, likely iron deficiency. She received iron dextran 10/11/2013. 7. Lupus/Sjgren's. 8. Pneumonia during hospitalization July 2015.  9. Abdominal abscesses with body fluid culture 10/20/2013 showing moderate Candida tropicalis. She was discharged home on a 4 week course of fluconazole.  10. Mucositis and hand/foot syndrome following cycle 1-week #3 5-FU/leucovorin, the 5-FU and  leucovorin were dose reduced. 11. 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 12. Status post LEEP 11/07/2013. 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 Pap smear 06/08/2014-negative for intraepithelial lesions or malignancy 13. Gram-negative sepsis 01/06/2015-ID/sensitivity pending 14. New right Hydronephrosis on CT 01/06/2015  Her clinical  status is much improved today. The source for gram-negative bacteremia has not been defined. Biliary sepsis is most likely. I discussed the CT findings with Ms. Gebel. The abdominal wall tumor burden appears improved after 2 cycles of FOLFOX and her pain/nausea have improved. The plan is to continue FOLFOX chemotherapy when she has recovered from sepsis.  Recommendations: 1. Antibiotics/management of gram-negative bacteremia per critical care medicine 2. Interventional radiology to decide on management of biliary drain 3. Advance diet as tolerated, she may be able to come off of TNA 4. Outpatient follow-up for an office visit and chemotherapy will be scheduled at the [facility] 5. Please call Oncology as needed   LOS: 1 day   [facility], Channelle Bottger  01/07/2015, 5:41 PM

## 2015-01-07 NOTE — Progress Notes (Signed)
Patient ID: Andrea Best, female   DOB: 1970/12/18, 44 y.o.   MRN: [id number]    Subjective: Pt denies flank pain. Feeling improved.  Objective: Vital signs in last 24 hours: Temp:  [98.3 F (36.8 C)-100.9 F (38.3 C)] 98.3 F (36.8 C) (10/13 0347) Pulse Rate:  [62-127] 89 (10/13 0730) Resp:  [0-31] 21 (10/13 0730) BP: (74-132)/(40-88) 100/66 mmHg (10/13 0730) SpO2:  [95 %-100 %] 99 % (10/13 0730) Weight:  [62.596 kg (138 lb)-65.8 kg (145 lb 1 oz)] 65.8 kg (145 lb 1 oz) (10/13 0317)  Intake/Output from previous day: 10/12 0701 - 10/13 0700 In: 5367.8 [I.V.:5167.8; IV Piggyback:200] Out: 3557 [Urine:1050; Drains:555] Intake/Output this shift: Total I/O In: 50 [IV Piggyback:50] Out: 400 [Urine:400]  Physical Exam:  General: Alert and oriented Abdomen: Soft, ND, No CVAT   Lab Results:  Recent Labs  01/06/15 0918 01/06/15 1035 01/07/15 0300  HGB 9.6* 9.4* 8.0*  HCT 29.5* 28.4* 24.4*   BMET  Recent Labs  01/06/15 1450 01/07/15 0300  NA 135 141  K 3.7 3.7  CL 110 115*  CO2 18* 21*  GLUCOSE 128* 129*  BUN 14 12  CREATININE 0.94 0.73  CALCIUM 7.4* 7.9*     Studies/Results:  Assessment/Plan: 1) Incidental right hydronephrosis: No indication for acute intervention.  Will consider further evaluation after recovery from acute illness.   LOS: 1 day   Joby Hershkowitz,LES 01/07/2015, 7:36 AM

## 2015-01-07 NOTE — Progress Notes (Signed)
Patient ID: Andrea Best, female   DOB: 09/13/1970, 44 y.o.   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 Denies significant abdominal pain ,nausea or vomiting.   Allergies: Compazine; Azithromycin; Sulfa antibiotics; Hydrocodone-acetaminophen; and Other  Medications: Prior to Admission medications   Medication Sig Start Date End Date Taking? Authorizing Provider  dexamethasone (DECADRON) 4 MG tablet Take 2 tablets (8 mg total) by mouth 2 (two) times daily. Take 8 mg twice a day for two days beginning the day after chemotherapy. 12/23/14  Yes Owens Shark, NP  DiphenhydrAMINE HCl (ZZZQUIL) 50 MG/30ML LIQD Take 30 mLs by mouth at bedtime as needed (for sleep).   Yes Historical Provider, MD  ibuprofen (ADVIL,MOTRIN) 200 MG tablet Take 200-600 mg by mouth every 6 (six) hours as needed for headache or moderate pain.   Yes Historical Provider, MD  lidocaine-prilocaine (EMLA) cream Appy small amount of cream over port site 1-2 hours prior to treatment and cover with plastic wrap.  DO NOT RUB IN Patient taking differently: Apply 1 application topically daily as needed. Appy small amount of cream over port site 1-2 hours prior to treatment and cover with plastic wrap.  DO NOT RUB IN 03/18/14  Yes Owens Shark, NP  LORazepam (ATIVAN) 0.5 MG tablet Take 1 tablet (0.5 mg total) by mouth every 8 (eight) hours as needed (nausea). May take oral or sublingual every 8 hours as needed for nausea 12/23/14  Yes Owens Shark, NP  morphine (MS CONTIN) 15 MG 12 hr tablet Take 1 tablet (15 mg total) by mouth every 12 (twelve) hours. 12/23/14  Yes Owens Shark, NP  ondansetron (ZOFRAN) 4 MG tablet Take 1 tablet (4 mg total) by mouth every 8 (eight) hours as needed for nausea or vomiting. 12/02/14  Yes Owens Shark, NP  oxyCODONE-acetaminophen  (PERCOCET/ROXICET) 5-325 MG per tablet Take 1 tablet by mouth every 4 (four) hours as needed for severe pain. 12/16/14  Yes Owens Shark, NP  [place]   Yes Historical Provider, MD  TPN ADULT Inject into the vein continuous. [place]   Yes Historical Provider, MD  hydroxychloroquine (PLAQUENIL) 200 MG tablet Take 1 tablet (200 mg total) by mouth 2 (two) times daily. Patient not taking: Reported on 12/24/2014 09/17/14   Ladell Pier, MD     Vital Signs: BP 96/55 mmHg  Pulse 88  Temp(Src) 98.1 F (36.7 C) (Oral)  Resp 20  Ht 5\' 6"  (1.676 m)  Wt 145 lb 1 oz (65.8 kg)  BMI 23.42 kg/m2  SpO2 100%  LMP 11/23/2014 (Approximate)  Physical Exam right upper quadrant biliary drain intact, insertion site okay, nontender, output 300 mL green bile; cultures pending; drain flushed with 10 mL sterile normal saline without difficulty.  Imaging: Dg Chest 2 View  01/06/2015  CLINICAL DATA:  Weakness and fatigue starting last night. Low back pain. Diarrhea. Sepsis. Chemotherapy. Lupus. EXAM: CHEST  2 VIEW COMPARISON:  11/21/2014 FINDINGS: Power injectable Port-A-Cath tip:  Cavoatrial junction. Low lung volumes are present, causing crowding of the pulmonary vasculature. Bandlike airspace opacity along the right hemidiaphragm favoring atelectasis based on configuration. No pleural effusion identified. Pigtail drain over the right upper quadrant compatible with biliary drain. IMPRESSION: 1. Bandlike opacity at the right lung base favoring atelectasis based on configuration. 2. Percutaneous biliary  drain. 3. Port-A-Cath tip:  Cavoatrial junction. Electronically Signed   By: Van Clines M.D.   On: 01/06/2015 12:02   Ct Abdomen Pelvis W Contrast  01/06/2015  CLINICAL DATA:  Current history of gastric cancer. Fever. Low back pain. EXAM: CT ABDOMEN AND PELVIS WITH CONTRAST TECHNIQUE: Multidetector CT imaging of the abdomen and pelvis was performed using the  standard protocol following bolus administration of intravenous contrast. CONTRAST:  167mL OMNIPAQUE IOHEXOL 300 MG/ML  SOLN COMPARISON:  CT scan of November 22, 2014. FINDINGS: Visualized lung bases are unremarkable. No significant osseous abnormality is noted. No gallstones are noted. Percutaneous biliary drainage catheter is again noted and unchanged in position compared to prior exam. No significant change in mild intrahepatic biliary dilatation is noted. Stable splenomegaly is noted. Stable mild pancreatic ductal dilatation is noted. Status post partial gastrectomy. No evidence of bowel obstruction is noted. The appendix appears normal. Adrenal glands and left kidney appear normal. Mild right hydronephrosis and proximal ureteral dilatation is noted ; there is noted an area of abnormal enhancement involving the mid ureter which may represent neoplastic involvement. Stable left ovarian cyst is noted. Stable fibroid is noted in uterine fundus. No abnormal fluid collection is noted. Urinary bladder appears normal. There is continued presence of abnormal soft tissue density seen along the anterior abdominal wall which is decreased compared to prior exam, consistent with improving peritoneal metastatic focus. Stable soft tissue density is seen in the porta hepatis region consistent with metastatic disease. IMPRESSION: Stable splenomegaly. Percutaneous biliary drainage catheter is unchanged in position compared to prior exam, with no significant change in mild intrahepatic biliary dilatation or pancreatic ductal dilatation. Status post partial gastrectomy. Stable left ovarian cyst is noted compared to prior exam. Continued presence of abnormal soft tissue density seen along the anterior abdominal wall which is decreased compared to prior exam, consistent with improving peritoneal metastatic focus. Stable soft tissue density seen in porta hepatis region consistent with metastatic disease. Interval development of mild  right hydronephrosis and proximal ureteral dilatation without obstructing calculus. Enhancing soft tissue abnormality is seen involving the midportion of the right ureter concerning for metastatic disease. Electronically Signed   By: Marijo Conception, M.D.   On: 01/06/2015 13:36    Labs:  CBC:  Recent Labs  12/24/14 0905 01/06/15 0918 01/06/15 1035 01/07/15 0300  WBC 16.0* 28.4* 27.9* 31.2*  HGB 9.7* 9.6* 9.4* 8.0*  HCT 29.0* 29.5* 28.4* 24.4*  PLT 227 165 146* 143*    COAGS:  Recent Labs  06/24/14 1142 11/19/14 0714 12/24/14 0905 01/06/15 1450  INR 1.04 1.15 1.18 1.56*  APTT 30  --   --  36    BMP:  Recent Labs  12/24/14 0905 01/06/15 0918 01/06/15 1035 01/06/15 1450 01/07/15 0300  NA 133* 136 133* 135 141  K 4.2 4.3 5.4* 3.7 3.7  CL 100*  --  101 110 115*  CO2 25 18* 20* 18* 21*  GLUCOSE 107* 125 131* 128* 129*  BUN 15 13.2 17 14 12   CALCIUM 9.5 9.0 8.7* 7.4* 7.9*  CREATININE 0.90 1.3* 1.39* 0.94 0.73  GFRNONAA >60  --  46* >60 >60  GFRAA >60  --  53* >60 >60    LIVER FUNCTION TESTS:  Recent Labs  12/24/14 0905 01/06/15 0918 01/06/15 1035 01/07/15 0300  BILITOT 0.7 1.12 2.1* 0.8  AST 47* 533* 536* 206*  ALT 32 250* 239* 143*  ALKPHOS 174* 384* 341* 245*  PROT 9.4* 8.2 8.2* 6.7  ALBUMIN 3.2* 2.5* 2.6* 2.2*    Assessment and Plan: 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, s/p I/E biliary drain 11/19/14 with last exchange on 12/24/14;  admitted 10/12 under sepsis protocol; much improved today; afebrile ; BP a little soft ; no hemobilia ; total bilirubin normal, LFTs with slight downward trend ; WBC up to 31.2, hemoglobin down to 8 (9.4)- Jehovah's witness, creatinine okay; check final bile cultures/sens; hydrate; watch electrolytes now that drain back to gravity bag; cont current tx as per CCM/urology   Signed: D. Rowe Robert 01/07/2015, 2:37 PM   I spent a total of 15 minutes at the the patient's bedside AND on  the patient's hospital floor or unit, greater than 50% of which was counseling/coordinating care for biliary drain

## 2015-01-08 DIAGNOSIS — A419 Sepsis, unspecified organism: Secondary | ICD-10-CM | POA: Insufficient documentation

## 2015-01-08 LAB — BASIC METABOLIC PANEL
ANION GAP: 5 (ref 5–15)
BUN: 9 mg/dL (ref 6–20)
CALCIUM: 8 mg/dL — AB (ref 8.9–10.3)
CO2: 20 mmol/L — AB (ref 22–32)
Chloride: 115 mmol/L — ABNORMAL HIGH (ref 101–111)
Creatinine, Ser: 0.75 mg/dL (ref 0.44–1.00)
GFR calc Af Amer: >60 mL/min (ref >60)
GFR calc non Af Amer: >60 mL/min (ref >60)
Glucose, Bld: 102 mg/dL — ABNORMAL HIGH (ref 65–99)
Potassium: 3.2 mmol/L — ABNORMAL LOW (ref 3.5–5.1)
Sodium: 140 mmol/L (ref 135–145)

## 2015-01-08 LAB — CBC
HEMATOCRIT: 23.3 % — AB (ref 36.0–46.0)
Hemoglobin: 7.7 g/dL — ABNORMAL LOW (ref 12.0–15.0)
MCH: 26.8 pg (ref 26.0–34.0)
MCHC: 33 g/dL (ref 30.0–36.0)
MCV: 81.2 fL (ref 78.0–100.0)
Platelets: 136 10*3/uL — ABNORMAL LOW (ref 150–400)
RBC: 2.87 MIL/uL — ABNORMAL LOW (ref 3.87–5.11)
RDW: 16.7 % — AB (ref 11.5–15.5)
WBC: 19.2 10*3/uL — AB (ref 4.0–10.5)

## 2015-01-08 LAB — MAGNESIUM: Magnesium: 2 mg/dL (ref 1.7–2.4)

## 2015-01-08 LAB — LACTIC ACID, PLASMA: Lactic Acid, Venous: 2.4 mmol/L (ref 0.5–2.0)

## 2015-01-08 MED ORDER — POTASSIUM CHLORIDE CRYS ER 20 MEQ PO TBCR
40.0000 meq | EXTENDED_RELEASE_TABLET | Freq: Once | ORAL | Status: AC
  Administered 2015-01-08: 40 meq via ORAL
  Filled 2015-01-08: qty 2

## 2015-01-08 NOTE — Progress Notes (Signed)
Patient ID: Andrea Best, female   DOB: [birth year], 44 y.o.   MRN: [id number]    Subjective: Pt still without flank pain. Overall feeling improved.  Objective: Vital signs in last 24 hours: Temp:  [98.1 F (36.7 C)-98.7 F (37.1 C)] 98.3 F (36.8 C) (10/14 0721) Pulse Rate:  [74-103] 80 (10/14 0800) Resp:  [13-24] 14 (10/14 0800) BP: (87-116)/(43-74) 113/57 mmHg (10/14 0800) SpO2:  [98 %-100 %] 99 % (10/14 0800) Weight:  [65.8 kg (145 lb 1 oz)] 65.8 kg (145 lb 1 oz) (10/14 0400)  Intake/Output from previous day: 10/13 0701 - 10/14 0700 In: 3300 [I.V.:3000; IV Piggyback:300] Out: 1775 [Urine:950; Drains:825] Intake/Output this shift: Total I/O In: 250 [I.V.:250] Out: 300 [Urine:300]  Physical Exam:  General: Alert and oriented Abdomen: Soft, ND, No CVAT  Lab Results:  Recent Labs  01/06/15 1035 01/07/15 0300 01/08/15 0430  HGB 9.4* 8.0* 7.7*  HCT 28.4* 24.4* 23.3*   BMET  Recent Labs  01/07/15 0300 01/08/15 0430  NA 141 140  K 3.7 3.2*  CL 115* 115*  CO2 21* 20*  GLUCOSE 129* 102*  BUN 12 9  CREATININE 0.73 0.75  CALCIUM 7.9* 8.0*     Studies/Results: Urine culture negative. Bile fluid culture with budding yeast and GPC.  Assessment/Plan: Right hydronephrosis: No renal source for infection identified and renal function remains normal. Will arrange for outpatient followup with Lasix renal scan to assess degree of possible obstruction of the right kidney.  If not truly obstructed, I would hold off on stenting the right ureter.  However, if there is significant obstruction, I would consider right ureteral stent placement to maintain optimal renal function considering need for ongoing systemic chemotherapy after discussion with Dr. Benay Spice.  Will sign off.  I will arrange urologic follow up.   LOS: 2 days   Andrea Best,LES 01/08/2015, 10:53 AM

## 2015-01-08 NOTE — Progress Notes (Signed)
[facility] PROGRESS NOTE  Andrea Best DJT:[id number] DOB: [birth year] DOA: 01/06/2015 PCP: Milagros Evener, MD Brief History: 44 yo lady with h/o gastric cancer s/p resection presents with sepsis. CT abdomen and pelvis showed Rt hydronephrosis with ureteral dilation w/o obstructing stone. Questionable soft-tissue enhancement suggesting metastatic involvement . Urology consulted and recommended outpatient follow up with Lasix renal scan to assess degree of possible obstruction of the right kidney. Meanwhile she was started on levophed and was transitioned off vasopressors and transferred the care to [facility] on 10/14. Her blood cultures grew gram negative rods and her cultures from bile grew gram neg rods and gram positive cocci. Awaiting identification and sensitivities.    Assessment/Plan: 1. Sepsis from gram negative bacteremia possibly from biliary drain, other sites include port a cath and hydronephrosis.   - much improved.  - levo phed gtt off and on NS.  - improving lactic acid levels.  - cultures growing gram negative rods, possibly more than one bacteria type, await identification and sensitivities.  - meanwhile resume zosyn.  - repeat lactic acid in am.    2. Right sided hydronephrosis with possible metastatic involvement: Urology and IR consulted , outpatient follow up with urology for a lasix renal scan to assess the obstruction vs stent placement.    3. Hypokalemia: replete as needed.    4. Gastric cancer: S/p biliary obstruction :s/p I/E biliary drain on 8/25, .  Further management as per Dr Learta Codding.    5. 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 Her hemoglobin has dropped to 7. Will get anemia panel.    Lupus : Plaquenil on hold.    Leukocytosis: improving.        Code Status: full code.  Family Communication: family at bedside Disposition Plan: pending further investigation, transfer to med surg[facility].     Consultants:  IR  PCCM.   Procedures:  none  Antibiotics:  zosyn  HPI/Subjective: Comfortable.  Wants to know when she can go home.   Objective: Filed Vitals:   01/08/15 1351  BP: 113/77  Pulse: 89  Temp:   Resp: 16    Intake/Output Summary (Last 24 hours) at 01/08/15 1418 Last data filed at 01/08/15 1351  Gross per 24 hour  Intake 3432.92 ml  Output   2100 ml  Net 1332.92 ml   Filed Weights   01/06/15 1700 01/07/15 0317 01/08/15 0400  Weight: 65.4 kg (144 lb 2.9 oz) 65.8 kg (145 lb 1 oz) 65.8 kg (145 lb 1 oz)    Exam:   General:  Alert and comfortable.   Cardiovascular: s1s2, slightly tachycardic.   Respiratory: clear to auscultation, no wheezing or rhonchi  Abdomen: soft biliary drain in place. Mildly tender in the left lower quadrant   Musculoskeletal: no pedal edema.   Data Reviewed: Basic Metabolic Panel:  Recent Labs Lab 01/06/15 0918 01/06/15 1035 01/06/15 1450 01/07/15 0300 01/08/15 0430  NA 136 133* 135 141 140  K 4.3 5.4* 3.7 3.7 3.2*  CL  --  101 110 115* 115*  CO2 18* 20* 18* 21* 20*  GLUCOSE 125 131* 128* 129* 102*  BUN 13.2 17 14 12 9   CREATININE 1.3* 1.39* 0.94 0.73 0.75  CALCIUM 9.0 8.7* 7.4* 7.9* 8.0*  MG  --   --   --  1.7 2.0  PHOS  --   --   --  3.1  --    Liver Function Tests:  Recent Labs Lab 01/06/15 0918 01/06/15 1035 01/07/15 0300  AST 533* 536* 206*  ALT 250* 239* 143*  ALKPHOS 384* 341* 245*  BILITOT 1.12 2.1* 0.8  PROT 8.2 8.2* 6.7  ALBUMIN 2.5* 2.6* 2.2*    Recent Labs Lab 01/06/15 1450  LIPASE 39    Recent Labs Lab 01/07/15 0300  AMMONIA 37*   CBC:  Recent Labs Lab 01/06/15 0918 01/06/15 1035 01/07/15 0300 01/08/15 0430  WBC 28.4* 27.9* 31.2* 19.2*  NEUTROABS 25.3* 25.1*  --   --   HGB 9.6* 9.4* 8.0* 7.7*  HCT 29.5* 28.4* 24.4* 23.3*  MCV 80.4 80.5 81.6 81.2  PLT 165 146* 143* 136*   Cardiac Enzymes: No results for input(s): CKTOTAL, CKMB, CKMBINDEX, TROPONINI in the  last 168 hours. BNP (last 3 results)  Recent Labs  01/06/15 1450  BNP 108.2*    ProBNP (last 3 results) No results for input(s): PROBNP in the last 8760 hours.  CBG: No results for input(s): GLUCAP in the last 168 hours.  Recent Results (from the past 240 hour(s))  Culture, blood (routine x 2)     Status: None (Preliminary result)   Collection Time: 01/06/15 10:42 AM  Result Value Ref Range Status   Specimen Description BLOOD LEFT ANTECUBITAL  Final   Special Requests BOTTLES DRAWN AEROBIC AND ANAEROBIC 5ML  Final   Culture  Setup Time   Final    GRAM NEGATIVE RODS IN BOTH AEROBIC AND ANAEROBIC BOTTLES CRITICAL RESULT CALLED TO, READ BACK BY AND VERIFIED WITH: [name] @0235  01/07/15 [name]    Culture   Final    GRAM NEGATIVE RODS Performed at [facility]    Report Status PENDING  Incomplete  Urine culture     Status: None   Collection Time: 01/06/15 10:44 AM  Result Value Ref Range Status   Specimen Description URINE, CATHETERIZED  Final   Special Requests NONE  Final   Culture   Final    NO GROWTH 1 DAY Performed at [facility]    Report Status 01/07/2015 FINAL  Final  Culture, blood (routine x 2)     Status: None (Preliminary result)   Collection Time: 01/06/15 11:00 AM  Result Value Ref Range Status   Specimen Description BLOOD PORTA CATH  Final   Special Requests BOTTLES DRAWN AEROBIC AND ANAEROBIC 5CC  Final   Culture  Setup Time   Final    GRAM NEGATIVE RODS IN BOTH AEROBIC AND ANAEROBIC BOTTLES CRITICAL RESULT CALLED TO, READ BACK BY AND VERIFIED WITH: [name] RN 2129 01/06/15 [name]    Culture   Final    Lonell Grandchild NEGATIVE RODS RULING OUT THE POSSIBILITY OF 2 GRAM NEGATIVE RODS PRESENT Performed at [facility]    Report Status PENDING  Incomplete  MRSA PCR Screening     Status: None   Collection Time: 01/06/15  4:38 PM  Result Value Ref Range Status   MRSA by PCR NEGATIVE NEGATIVE Final    Comment:        The GeneXpert  MRSA Assay (FDA approved for NASAL specimens only), is one component of a comprehensive MRSA colonization surveillance program. It is not intended to diagnose MRSA infection nor to guide or monitor treatment for MRSA infections.   Culture, body fluid-bottle     Status: None (Preliminary result)   Collection Time: 01/06/15  7:12 PM  Result Value Ref Range Status   Specimen Description FLUID BILE  Final   Special Requests RUQ  Final   Gram Stain   Final  GRAM VARIABLE ROD IN BOTH AEROBIC AND ANAEROBIC BOTTLES CRITICAL RESULT CALLED TO, READ BACK BY AND VERIFIED WITH: BARHAM @0509  01/07/15 [name]    Culture   Final    GRAM NEGATIVE RODS Performed at [facility]    Report Status PENDING  Incomplete  Gram stain     Status: None   Collection Time: 01/06/15  7:12 PM  Result Value Ref Range Status   Specimen Description FLUID BILE  Final   Special Requests RUQ  Final   Gram Stain   Final    CYTOSPIN SMEAR BUDDING YEAST SEEN GRAM VARIABLE ROD GRAM POSITIVE COCCI IN CLUSTERS CRITICAL RESULT CALLED TO, READ BACK BY AND VERIFIED WITH: [name] @0235  01/07/15 [name]    Report Status 01/07/2015 FINAL  Final     Studies: No results found.  Scheduled Meds: . antiseptic oral rinse  7 mL Mouth Rinse q12n4p  . chlorhexidine  15 mL Mouth Rinse BID  . feeding supplement  1 Container Oral BID BM  . heparin  5,000 Units Subcutaneous 3 times per day  . morphine  15 mg Oral Q12H  . piperacillin-tazobactam (ZOSYN)  IV  3.375 g Intravenous Q8H   Continuous Infusions: . sodium chloride 1,000 mL (01/08/15 1351)    Active Problems:   Gastric cancer (HCC)   Septic shock (HCC)   Arterial hypotension   Biliary obstruction   Malnutrition of moderate degree    Time spent: 35 minutes/     Kenef[place]  [facility] Pager [phone]  If 7PM-7AM, please contact night-coverage at [email or web address], password [facility] 01/08/2015, 2:18 PM  LOS: 2 days

## 2015-01-08 NOTE — Progress Notes (Signed)
Patient ID: Andrea Best, female   DOB: December 17, 1970, 44 y.o.   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 Authorizing Provider  dexamethasone (DECADRON) 4 MG tablet Take 2 tablets (8 mg total) by mouth 2 (two) times daily. Take 8 mg twice a day for two days beginning the day after chemotherapy. 12/23/14  Yes Owens Shark, NP  DiphenhydrAMINE HCl (ZZZQUIL) 50 MG/30ML LIQD Take 30 mLs by mouth at bedtime as needed (for sleep).   Yes Historical Provider, MD  ibuprofen (ADVIL,MOTRIN) 200 MG tablet Take 200-600 mg by mouth every 6 (six) hours as needed for headache or moderate pain.   Yes Historical Provider, MD  lidocaine-prilocaine (EMLA) cream Appy small amount of cream over port site 1-2 hours prior to treatment and cover with plastic wrap.  DO NOT RUB IN Patient taking differently: Apply 1 application topically daily as needed. Appy small amount of cream over port site 1-2 hours prior to treatment and cover with plastic wrap.  DO NOT RUB IN 03/18/14  Yes Owens Shark, NP  LORazepam (ATIVAN) 0.5 MG tablet Take 1 tablet (0.5 mg total) by mouth every 8 (eight) hours as needed (nausea). May take oral or sublingual every 8 hours as needed for nausea 12/23/14  Yes Owens Shark, NP  morphine (MS CONTIN) 15 MG 12 hr tablet Take 1 tablet (15 mg total) by mouth every 12 (twelve) hours. 12/23/14  Yes Owens Shark, NP  ondansetron (ZOFRAN) 4 MG tablet Take 1 tablet (4 mg total) by mouth every 8 (eight) hours as needed for nausea or vomiting. 12/02/14  Yes Owens Shark, NP  oxyCODONE-acetaminophen (PERCOCET/ROXICET) 5-325 MG per tablet Take 1 tablet by  mouth every 4 (four) hours as needed for severe pain. 12/16/14  Yes Owens Shark, NP  [place]   Yes Historical Provider, MD  TPN ADULT Inject into the vein continuous. [place]   Yes Historical Provider, MD  hydroxychloroquine (PLAQUENIL) 200 MG tablet Take 1 tablet (200 mg total) by mouth 2 (two) times daily. Patient not taking: Reported on 12/24/2014 09/17/14   Ladell Pier, MD     Vital Signs: BP 113/57 mmHg  Pulse 80  Temp(Src) 98.3 F (36.8 C) (Oral)  Resp 14  Ht 5\' 6"  (1.676 m)  Wt 145 lb 1 oz (65.8 kg)  BMI 23.42 kg/m2  SpO2 99%  LMP 11/23/2014 (Approximate)  Physical Exam rt biliary drain intact, insertion site ok, NT, output 125 cc; cx's pend- gm neg rods  Imaging: Dg Chest 2 View  01/06/2015  CLINICAL DATA:  Weakness and fatigue starting last night. Low back pain. Diarrhea. Sepsis. Chemotherapy. Lupus. EXAM: CHEST  2 VIEW COMPARISON:  11/21/2014 FINDINGS: Power injectable Port-A-Cath tip:  Cavoatrial junction. Low lung volumes are present, causing crowding of the pulmonary vasculature. Bandlike airspace opacity along the right hemidiaphragm favoring atelectasis based on configuration. No pleural effusion identified. Pigtail drain over the right upper quadrant compatible with biliary drain. IMPRESSION: 1. Bandlike opacity at the right lung base favoring atelectasis based on configuration. 2. Percutaneous biliary drain. 3. Port-A-Cath tip:  Cavoatrial junction. Electronically Signed   By: Thayer Jew  Janeece Fitting M.D.   On: 01/06/2015 12:02   Ct Abdomen Pelvis W Contrast  01/06/2015  CLINICAL DATA:  Current history of gastric cancer. Fever. Low back pain. EXAM: CT ABDOMEN AND PELVIS WITH CONTRAST TECHNIQUE: Multidetector CT imaging of the abdomen and pelvis was performed using the standard protocol following bolus administration of intravenous contrast. CONTRAST:  163mL OMNIPAQUE IOHEXOL 300 MG/ML  SOLN COMPARISON:  CT scan of November 22, 2014. FINDINGS: Visualized lung bases are unremarkable. No significant osseous abnormality is noted. No gallstones are noted. Percutaneous biliary drainage catheter is again noted and unchanged in position compared to prior exam. No significant change in mild intrahepatic biliary dilatation is noted. Stable splenomegaly is noted. Stable mild pancreatic ductal dilatation is noted. Status post partial gastrectomy. No evidence of bowel obstruction is noted. The appendix appears normal. Adrenal glands and left kidney appear normal. Mild right hydronephrosis and proximal ureteral dilatation is noted ; there is noted an area of abnormal enhancement involving the mid ureter which may represent neoplastic involvement. Stable left ovarian cyst is noted. Stable fibroid is noted in uterine fundus. No abnormal fluid collection is noted. Urinary bladder appears normal. There is continued presence of abnormal soft tissue density seen along the anterior abdominal wall which is decreased compared to prior exam, consistent with improving peritoneal metastatic focus. Stable soft tissue density is seen in the porta hepatis region consistent with metastatic disease. IMPRESSION: Stable splenomegaly. Percutaneous biliary drainage catheter is unchanged in position compared to prior exam, with no significant change in mild intrahepatic biliary dilatation or pancreatic ductal dilatation. Status post partial gastrectomy. Stable left ovarian cyst is noted compared to prior exam. Continued presence of abnormal soft tissue density seen along the anterior abdominal wall which is decreased compared to prior exam, consistent with improving peritoneal metastatic focus. Stable soft tissue density seen in porta hepatis region consistent with metastatic disease. Interval development of mild right hydronephrosis and proximal ureteral dilatation without obstructing calculus. Enhancing soft tissue abnormality is seen involving the midportion of the  right ureter concerning for metastatic disease. Electronically Signed   By: Marijo Conception, M.D.   On: 01/06/2015 13:36    Labs:  CBC:  Recent Labs  01/06/15 0918 01/06/15 1035 01/07/15 0300 01/08/15 0430  WBC 28.4* 27.9* 31.2* 19.2*  HGB 9.6* 9.4* 8.0* 7.7*  HCT 29.5* 28.4* 24.4* 23.3*  PLT 165 146* 143* 136*    COAGS:  Recent Labs  06/24/14 1142 11/19/14 0714 12/24/14 0905 01/06/15 1450  INR 1.04 1.15 1.18 1.56*  APTT 30  --   --  36    BMP:  Recent Labs  01/06/15 1035 01/06/15 1450 01/07/15 0300 01/08/15 0430  NA 133* 135 141 140  K 5.4* 3.7 3.7 3.2*  CL 101 110 115* 115*  CO2 20* 18* 21* 20*  GLUCOSE 131* 128* 129* 102*  BUN 17 14 12 9   CALCIUM 8.7* 7.4* 7.9* 8.0*  CREATININE 1.39* 0.94 0.73 0.75  GFRNONAA 46* >60 >60 >60  GFRAA 53* >60 >60 >60    LIVER FUNCTION TESTS:  Recent Labs  12/24/14 0905 01/06/15 0918 01/06/15 1035 01/07/15 0300  BILITOT 0.7 1.12 2.1* 0.8  AST 47* 533* 536* 206*  ALT 32 250* 239* 143*  ALKPHOS 174* 384* 341* 245*  PROT 9.4* 8.2 8.2* 6.7  ALBUMIN 3.2* 2.5* 2.6* 2.2*    Assessment and Plan: 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, s/p I/E biliary drain 11/19/14 with last exchange on 12/24/14; admitted  10/12 under sepsis protocol;afebrile; last t bili nl; K 3.2, creat ok, WBC 19(31), HGB 7.7; check final bile cx's- gm neg rods; case d/w Dr. Kathlene Cote- for now rec cont drainage of biliary system; can consider biliary stent in future depending on how pt responds to chemotherapy; keep scheduled appt for drain exchange on 02/23/15; replace electrolytes/fluids appropriately as OP since biliary system has been attached back to gravity bag   Signed: D. Rowe Robert 01/08/2015, 12:47 PM   I spent a total of 15 minutes at the the patient's bedside AND on the patient's hospital floor or unit, greater than 50% of which was counseling/coordinating care for biliary drain

## 2015-01-09 DIAGNOSIS — K831 Obstruction of bile duct: Secondary | ICD-10-CM

## 2015-01-09 DIAGNOSIS — A4151 Sepsis due to Escherichia coli [E. coli]: Secondary | ICD-10-CM

## 2015-01-09 LAB — BASIC METABOLIC PANEL
Anion gap: 4 — ABNORMAL LOW (ref 5–15)
CALCIUM: 8 mg/dL — AB (ref 8.9–10.3)
CO2: 23 mmol/L (ref 22–32)
CREATININE: 0.79 mg/dL (ref 0.44–1.00)
Chloride: 112 mmol/L — ABNORMAL HIGH (ref 101–111)
GFR calc Af Amer: >60 mL/min (ref >60)
Glucose, Bld: 106 mg/dL — ABNORMAL HIGH (ref 65–99)
Potassium: 3 mmol/L — ABNORMAL LOW (ref 3.5–5.1)
SODIUM: 139 mmol/L (ref 135–145)

## 2015-01-09 LAB — CULTURE, BLOOD (ROUTINE X 2)

## 2015-01-09 LAB — LACTIC ACID, PLASMA: LACTIC ACID, VENOUS: 1.5 mmol/L (ref 0.5–2.0)

## 2015-01-09 MED ORDER — POTASSIUM CHLORIDE CRYS ER 20 MEQ PO TBCR
40.0000 meq | EXTENDED_RELEASE_TABLET | Freq: Two times a day (BID) | ORAL | Status: AC
  Administered 2015-01-09 (×2): 40 meq via ORAL
  Filled 2015-01-09 (×2): qty 2

## 2015-01-09 NOTE — Progress Notes (Signed)
ANTIBIOTIC CONSULT NOTE - FOLLOW UP  Pharmacy Consult for Zosyn Indication: Bacteremia  Allergies  Allergen Reactions  . Compazine [Prochlorperazine Maleate] Other (See Comments)    Dystonia  . Azithromycin Rash  . Sulfa Antibiotics Rash  . Hydrocodone-Acetaminophen Nausea And Vomiting    Hallucinating   . 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 SHE DOES NOT WANT ANY BLOOD PRODUCTS OR FRACTIONS    Patient Measurements: Height: 5\' 6"  (167.6 cm) Weight: 152 lb 8.9 oz (69.2 kg) IBW/kg (Calculated) : 59.3  Vital Signs: Temp: 98.6 F (37 C) (10/15 0443) Temp Source: Oral (10/15 0443) BP: 99/65 mmHg (10/15 0443) Pulse Rate: 94 (10/15 0443) Intake/Output from previous day: 10/14 0701 - 10/15 0700 In: 1207.9 [P.O.:400; I.V.:757.9; IV Piggyback:50] Out: 2525 [Urine:2200; Drains:325]  Labs:  Recent Labs  01/06/15 1035 01/06/15 1450 01/07/15 0300 01/08/15 0430  WBC 27.9*  --  31.2* 19.2*  HGB 9.4*  --  8.0* 7.7*  PLT 146*  --  143* 136*  CREATININE 1.39* 0.94 0.73 0.75   Estimated Creatinine Clearance: 85.8 mL/min (by C-G formula based on Cr of 0.75).  Assessment: 44yo F admitted 10/12 with weakness, low back pain, diarrhea. On presentation, she was hypotensive, febrile to 100.9, with elevated LA and WBCs.  She has a central line for TPN at home as well as a RUQ biliary drain for hx of intra- abdominal abscesses.  Pharmacy was initially consulted to dose vancomycin and Zosyn for suspected sepsis, but was narrowed to Zosyn alone.  10/12 >> Vanc >> 10/13 10/12 >> Zosyn >>  Today, 01/09/2015: Day #4 Zosyn Afebrile WBC elevated but improving, 19.2 SCr 0.75 with CrCl 86 ml/min. Blood cultures growing Klebsiella (pan-sens to all tested), Ecoli (pan-sens to all tested except ampicillin)  Goal of Therapy:  Appropriate abx dosing, eradication of infection.   Plan:   Continue Zosyn 3.375g IV Q8H infused over 4hrs. Follow up renal fxn, culture results, and clinical  course.  MD:  Recommend narrowing antibiotic therapy - both are sensitive to Cefazolin.  Gretta Arab PharmD, BCPS Pager [phone] 01/09/2015 10:22 AM

## 2015-01-09 NOTE — Progress Notes (Signed)
[facility] PROGRESS NOTE  Andrea Best WUX:[id number] DOB: [birth year] DOA: 01/06/2015 PCP: Milagros Evener, MD Brief History: 44 yo lady with h/o gastric cancer s/p resection presents with sepsis. CT abdomen and pelvis showed Rt hydronephrosis with ureteral dilation w/o obstructing stone. Questionable soft-tissue enhancement suggesting metastatic involvement . Urology consulted and recommended outpatient follow up with Lasix renal scan to assess degree of possible obstruction of the right kidney. Meanwhile she was started on levophed and was transitioned off vasopressors and transferred the care to [facility] on 10/14. Her blood cultures grew gram negative rods and her cultures from bile grew gram neg rods and gram positive cocci. Gram negative rods in blood cultures grew e coli and klebsiella pan sensitive. Plan to call ID in am for recommendations for antibiotic duration.    Assessment/Plan: 1. Sepsis from gram negative bacteremia possibly from biliary drain, other sites include port a cath and hydronephrosis.   - much improved.  - levo phed gtt off and on NS.  - improving lactic acid levels.  - cultures growing gram negative rods,  grew e coli and klebsiella pan sensitive. Plan to call ID in am for recommendations for antibiotic duration.   - meanwhile resume zosyn.  - repeat lactic acid in am shows improvement.    2. Right sided hydronephrosis with possible metastatic involvement: Urology and IR consulted , outpatient follow up with urology for a lasix renal scan to assess the obstruction vs stent placement.    3. Hypokalemia: replete as needed.  Repeat in am.    4. Gastric cancer: S/p biliary obstruction :s/p I/E biliary drain on 8/25, .  Further management as per Dr Learta Codding.    5. 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 Her hemoglobin has dropped to 7. Will get anemia panel.    Lupus : Plaquenil on hold.    Leukocytosis: improving.         Code Status: full code.  Family Communication: family at bedside Disposition Plan: possibly home in a day or two.    Consultants:  IR  PCCM.   Procedures:  none  Antibiotics:  zosyn  HPI/Subjective: Comfortable.  Denies any new complaints.   Objective: Filed Vitals:   01/09/15 1310  BP: 107/61  Pulse: 100  Temp: 98.2 F (36.8 C)  Resp: 18    Intake/Output Summary (Last 24 hours) at 01/09/15 1753 Last data filed at 01/09/15 1752  Gross per 24 hour  Intake    840 ml  Output   1450 ml  Net   -610 ml   Filed Weights   01/08/15 0400 01/08/15 1548 01/09/15 0443  Weight: 65.8 kg (145 lb 1 oz) 69 kg (152 lb 1.9 oz) 69.2 kg (152 lb 8.9 oz)    Exam:   General:  Alert and comfortable.   Cardiovascular: s1s2, slightly tachycardic.   Respiratory: clear to auscultation, no wheezing or rhonchi  Abdomen: soft biliary drain in place. Mildly tender in the left lower quadrant   Musculoskeletal: no pedal edema.   Data Reviewed: Basic Metabolic Panel:  Recent Labs Lab 01/06/15 1035 01/06/15 1450 01/07/15 0300 01/08/15 0430 01/09/15 1200  NA 133* 135 141 140 139  K 5.4* 3.7 3.7 3.2* 3.0*  CL 101 110 115* 115* 112*  CO2 20* 18* 21* 20* 23  GLUCOSE 131* 128* 129* 102* 106*  BUN 17 14 12 9  <5*  CREATININE 1.39* 0.94 0.73 0.75 0.79  CALCIUM 8.7* 7.4* 7.9* 8.0* 8.0*  MG  --   --  1.7 2.0  --   PHOS  --   --  3.1  --   --    Liver Function Tests:  Recent Labs Lab 01/06/15 0918 01/06/15 1035 01/07/15 0300  AST 533* 536* 206*  ALT 250* 239* 143*  ALKPHOS 384* 341* 245*  BILITOT 1.12 2.1* 0.8  PROT 8.2 8.2* 6.7  ALBUMIN 2.5* 2.6* 2.2*    Recent Labs Lab 01/06/15 1450  LIPASE 39    Recent Labs Lab 01/07/15 0300  AMMONIA 37*   CBC:  Recent Labs Lab 01/06/15 0918 01/06/15 1035 01/07/15 0300 01/08/15 0430  WBC 28.4* 27.9* 31.2* 19.2*  NEUTROABS 25.3* 25.1*  --   --   HGB 9.6* 9.4* 8.0* 7.7*  HCT 29.5* 28.4* 24.4* 23.3*   MCV 80.4 80.5 81.6 81.2  PLT 165 146* 143* 136*   Cardiac Enzymes: No results for input(s): CKTOTAL, CKMB, CKMBINDEX, TROPONINI in the last 168 hours. BNP (last 3 results)  Recent Labs  01/06/15 1450  BNP 108.2*    ProBNP (last 3 results) No results for input(s): PROBNP in the last 8760 hours.  CBG: No results for input(s): GLUCAP in the last 168 hours.  Recent Results (from the past 240 hour(s))  Culture, blood (routine x 2)     Status: None (Preliminary result)   Collection Time: 01/06/15 10:42 AM  Result Value Ref Range Status   Specimen Description BLOOD LEFT ANTECUBITAL  Final   Special Requests BOTTLES DRAWN AEROBIC AND ANAEROBIC 5ML  Final   Culture  Setup Time   Final    GRAM NEGATIVE RODS IN BOTH AEROBIC AND ANAEROBIC BOTTLES CRITICAL RESULT CALLED TO, READ BACK BY AND VERIFIED WITH: [name] @0235  01/07/15 [name]    Culture   Final    KLEBSIELLA PNEUMONIAE SUSCEPTIBILITIES PERFORMED ON PREVIOUS CULTURE WITHIN THE LAST 5 DAYS. Performed at [facility]    Report Status PENDING  Incomplete  Urine culture     Status: None   Collection Time: 01/06/15 10:44 AM  Result Value Ref Range Status   Specimen Description URINE, CATHETERIZED  Final   Special Requests NONE  Final   Culture   Final    NO GROWTH 1 DAY Performed at [facility]    Report Status 01/07/2015 FINAL  Final  Culture, blood (routine x 2)     Status: None   Collection Time: 01/06/15 11:00 AM  Result Value Ref Range Status   Specimen Description BLOOD PORTA CATH  Final   Special Requests BOTTLES DRAWN AEROBIC AND ANAEROBIC 5CC  Final   Culture  Setup Time   Final    GRAM NEGATIVE RODS IN BOTH AEROBIC AND ANAEROBIC BOTTLES CRITICAL RESULT CALLED TO, READ BACK BY AND VERIFIED WITH: [name] RN 2129 01/06/15 [name]    Culture   Final    KLEBSIELLA PNEUMONIAE ESCHERICHIA COLI Performed at [facility]    Report Status 01/09/2015 FINAL  Final   Organism ID, Bacteria  KLEBSIELLA PNEUMONIAE  Final   Organism ID, Bacteria ESCHERICHIA COLI  Final      Susceptibility   Escherichia coli - MIC*    AMPICILLIN 8 SENSITIVE Sensitive     CEFAZOLIN <=4 SENSITIVE Sensitive     CEFEPIME <=1 SENSITIVE Sensitive     CEFTAZIDIME <=1 SENSITIVE Sensitive     CEFTRIAXONE <=1 SENSITIVE Sensitive     CIPROFLOXACIN <=0.25 SENSITIVE Sensitive     GENTAMICIN <=1 SENSITIVE Sensitive     IMIPENEM <=0.25 SENSITIVE Sensitive  TRIMETH/SULFA <=20 SENSITIVE Sensitive     AMPICILLIN/SULBACTAM 4 SENSITIVE Sensitive     PIP/TAZO <=4 SENSITIVE Sensitive     * ESCHERICHIA COLI   Klebsiella pneumoniae - MIC*    AMPICILLIN >=32 RESISTANT Resistant     CEFAZOLIN <=4 SENSITIVE Sensitive     CEFEPIME <=1 SENSITIVE Sensitive     CEFTAZIDIME <=1 SENSITIVE Sensitive     CEFTRIAXONE <=1 SENSITIVE Sensitive     CIPROFLOXACIN <=0.25 SENSITIVE Sensitive     GENTAMICIN <=1 SENSITIVE Sensitive     IMIPENEM <=0.25 SENSITIVE Sensitive     TRIMETH/SULFA <=20 SENSITIVE Sensitive     AMPICILLIN/SULBACTAM 4 SENSITIVE Sensitive     PIP/TAZO <=4 SENSITIVE Sensitive     * KLEBSIELLA PNEUMONIAE  MRSA PCR Screening     Status: None   Collection Time: 01/06/15  4:38 PM  Result Value Ref Range Status   MRSA by PCR NEGATIVE NEGATIVE Final    Comment:        The GeneXpert MRSA Assay (FDA approved for NASAL specimens only), is one component of a comprehensive MRSA colonization surveillance program. It is not intended to diagnose MRSA infection nor to guide or monitor treatment for MRSA infections.   Culture, body fluid-bottle     Status: None (Preliminary result)   Collection Time: 01/06/15  7:12 PM  Result Value Ref Range Status   Specimen Description FLUID BILE  Final   Special Requests RUQ  Final   Gram Stain   Final    GRAM VARIABLE ROD IN BOTH AEROBIC AND ANAEROBIC BOTTLES CRITICAL RESULT CALLED TO, READ BACK BY AND VERIFIED WITH: BARHAM @0509  01/07/15 [name]    Culture   Final     GRAM NEGATIVE RODS REPEATING ID AND SENSITIVITIES Performed at [facility]    Report Status PENDING  Incomplete  Gram stain     Status: None   Collection Time: 01/06/15  7:12 PM  Result Value Ref Range Status   Specimen Description FLUID BILE  Final   Special Requests RUQ  Final   Gram Stain   Final    CYTOSPIN SMEAR BUDDING YEAST SEEN GRAM VARIABLE ROD GRAM POSITIVE COCCI IN CLUSTERS CRITICAL RESULT CALLED TO, READ BACK BY AND VERIFIED WITH: [name] @0235  01/07/15 [name]    Report Status 01/07/2015 FINAL  Final     Studies: No results found.  Scheduled Meds: . antiseptic oral rinse  7 mL Mouth Rinse q12n4p  . chlorhexidine  15 mL Mouth Rinse BID  . feeding supplement  1 Container Oral BID BM  . heparin  5,000 Units Subcutaneous 3 times per day  . morphine  15 mg Oral Q12H  . piperacillin-tazobactam (ZOSYN)  IV  3.375 g Intravenous Q8H  . potassium chloride  40 mEq Oral BID   Continuous Infusions: . sodium chloride 1,000 mL (01/08/15 1351)    Active Problems:   Gastric cancer ([place])   Septic shock ([facility])   Arterial hypotension   Biliary obstruction   Malnutrition of moderate degree   Sepsis ([place])    Time spent: 25 minutes/     Orvell Careaga  [facility] Pager [phone]  If 7PM-7AM, please contact night-coverage at [email or web address], password [facility] 01/09/2015, 5:53 PM  LOS: 3 days

## 2015-01-10 LAB — IRON AND TIBC
Iron: 33 ug/dL (ref 28–170)
SATURATION RATIOS: 14 % (ref 10.4–31.8)
TIBC: 234 ug/dL — AB (ref 250–450)
UIBC: 201 ug/dL

## 2015-01-10 LAB — CBC
HCT: 23.7 % — ABNORMAL LOW (ref 36.0–46.0)
HEMOGLOBIN: 7.6 g/dL — AB (ref 12.0–15.0)
MCH: 25.8 pg — ABNORMAL LOW (ref 26.0–34.0)
MCHC: 32.1 g/dL (ref 30.0–36.0)
MCV: 80.3 fL (ref 78.0–100.0)
PLATELETS: 171 10*3/uL (ref 150–400)
RBC: 2.95 MIL/uL — ABNORMAL LOW (ref 3.87–5.11)
RDW: 16.8 % — AB (ref 11.5–15.5)
WBC: 10.3 10*3/uL (ref 4.0–10.5)

## 2015-01-10 LAB — VITAMIN B12: VITAMIN B 12: 1129 pg/mL — AB (ref 180–914)

## 2015-01-10 LAB — BASIC METABOLIC PANEL
Anion gap: 5 (ref 5–15)
BUN: <5 mg/dL — ABNORMAL LOW (ref 6–20)
CO2: 25 mmol/L (ref 22–32)
CREATININE: 0.9 mg/dL (ref 0.44–1.00)
Calcium: 8.5 mg/dL — ABNORMAL LOW (ref 8.9–10.3)
Chloride: 111 mmol/L (ref 101–111)
Glucose, Bld: 91 mg/dL (ref 65–99)
Potassium: 3.8 mmol/L (ref 3.5–5.1)
SODIUM: 141 mmol/L (ref 135–145)

## 2015-01-10 LAB — CULTURE, BLOOD (ROUTINE X 2)

## 2015-01-10 LAB — RETICULOCYTES
RBC.: 2.95 MIL/uL — AB (ref 3.87–5.11)
RETIC CT PCT: 1.7 % (ref 0.4–3.1)
Retic Count, Absolute: 50.2 10*3/uL (ref 19.0–186.0)

## 2015-01-10 LAB — FOLATE: FOLATE: 38.4 ng/mL (ref >5.9)

## 2015-01-10 LAB — FERRITIN: FERRITIN: 24 ng/mL (ref 11–307)

## 2015-01-10 MED ORDER — CIPROFLOXACIN HCL 500 MG PO TABS
750.0000 mg | ORAL_TABLET | Freq: Two times a day (BID) | ORAL | Status: DC
  Administered 2015-01-10 – 2015-01-11 (×3): 750 mg via ORAL
  Filled 2015-01-10 (×6): qty 1

## 2015-01-10 NOTE — Progress Notes (Signed)
[facility] PROGRESS NOTE  Andrea Best BMW:[id number] DOB: January 01, 1971 DOA: 01/06/2015 PCP: Milagros Evener, MD Brief History: 44 yo lady with h/o gastric cancer s/p resection presents with sepsis. CT abdomen and pelvis showed Rt hydronephrosis with ureteral dilation w/o obstructing stone. Questionable soft-tissue enhancement suggesting metastatic involvement . Urology consulted and recommended outpatient follow up with Lasix renal scan to assess degree of possible obstruction of the right kidney. Meanwhile she was started on levophed and was transitioned off vasopressors and transferred the care to [facility] on 10/14. Her blood cultures grew gram negative rods and her cultures from bile grew gram neg rods and gram positive cocci. Gram negative rods in blood cultures grew e coli and klebsiella pan sensitive. Plan to call ID in am for recommendations for antibiotic duration.    Assessment/Plan: 1. Sepsis from gram negative bacteremia possibly coming  from biliary drain, other sites include port a cath and hydronephrosis.   - much improved.  - levo phed gtt off and stable blood pressures.  - improving lactic acid levels.  - cultures growing gram negative rods,  grew e coli and klebsiella pan sensitive . Changed antibiotic from zosyn to ciprofloxacin. Cultures from the biliary drain growing enterobacter sensitive to ciprofloxacin. Repeat blood cultures ordered and pending.  - suggest changing the biliary drain vs putting a stent , will discuss with IR and Dr Learta Codding before discharge in the next 24 to 48 hours.    2. Right sided hydronephrosis with possible metastatic involvement: Urology and IR consulted , outpatient follow up with urology for a lasix renal scan to assess the obstruction vs stent placement.    3. Hypokalemia: replete as needed.  Repeat in am.    4. Gastric cancer: S/p biliary obstruction :s/p I/E biliary drain on 8/25, .  Further management as per Dr Learta Codding.     5. 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 Her hemoglobin has dropped to 7. Anemia panel revealed anemia of chronic disease.     Lupus : Plaquenil on hold.    Leukocytosis: improving.        Code Status: full code.  Family Communication: family at bedside Disposition Plan: possibly home in a day or two when the repeat bloodc ultures are negative.     Consultants:  IR  PCCM.   Oncology.  Procedures:  none  Antibiotics:  Zosyn till 10/16  Ciprofloxacin started on 10/17  HPI/Subjective: Comfortable.  Denies any new complaints.   Objective: Filed Vitals:   01/10/15 1424  BP: 108/78  Pulse: 94  Temp: 98.7 F (37.1 C)  Resp: 18    Intake/Output Summary (Last 24 hours) at 01/10/15 1518 Last data filed at 01/10/15 1400  Gross per 24 hour  Intake   8063 ml  Output    945 ml  Net   7118 ml   Filed Weights   01/08/15 1548 01/09/15 0443 01/10/15 0554  Weight: 69 kg (152 lb 1.9 oz) 69.2 kg (152 lb 8.9 oz) 65.1 kg (143 lb 8.3 oz)    Exam:   General:  Alert and comfortable.   Cardiovascular: s1s2, slightly tachycardic.   Respiratory: clear to auscultation, no wheezing or rhonchi  Abdomen: soft biliary drain in place. Mildly tender in the left lower quadrant   Musculoskeletal: no pedal edema.   Data Reviewed: Basic Metabolic Panel:  Recent Labs Lab 01/06/15 1450 01/07/15 0300 01/08/15 0430 01/09/15 1200 01/10/15 0608  NA 135 141 140 139 141  K 3.7 3.7 3.2* 3.0*  3.8  CL 110 115* 115* 112* 111  CO2 18* 21* 20* 23 25  GLUCOSE 128* 129* 102* 106* 91  BUN 14 12 9  <5* <5*  CREATININE 0.94 0.73 0.75 0.79 0.90  CALCIUM 7.4* 7.9* 8.0* 8.0* 8.5*  MG  --  1.7 2.0  --   --   PHOS  --  3.1  --   --   --    Liver Function Tests:  Recent Labs Lab 01/06/15 0918 01/06/15 1035 01/07/15 0300  AST 533* 536* 206*  ALT 250* 239* 143*  ALKPHOS 384* 341* 245*  BILITOT 1.12 2.1* 0.8  PROT 8.2 8.2* 6.7  ALBUMIN 2.5* 2.6* 2.2*     Recent Labs Lab 01/06/15 1450  LIPASE 39    Recent Labs Lab 01/07/15 0300  AMMONIA 37*   CBC:  Recent Labs Lab 01/06/15 0918 01/06/15 1035 01/07/15 0300 01/08/15 0430 01/10/15 0608  WBC 28.4* 27.9* 31.2* 19.2* 10.3  NEUTROABS 25.3* 25.1*  --   --   --   HGB 9.6* 9.4* 8.0* 7.7* 7.6*  HCT 29.5* 28.4* 24.4* 23.3* 23.7*  MCV 80.4 80.5 81.6 81.2 80.3  PLT 165 146* 143* 136* 171   Cardiac Enzymes: No results for input(s): CKTOTAL, CKMB, CKMBINDEX, TROPONINI in the last 168 hours. BNP (last 3 results)  Recent Labs  01/06/15 1450  BNP 108.2*    ProBNP (last 3 results) No results for input(s): PROBNP in the last 8760 hours.  CBG: No results for input(s): GLUCAP in the last 168 hours.  Recent Results (from the past 240 hour(s))  Culture, blood (routine x 2)     Status: None   Collection Time: 01/06/15 10:42 AM  Result Value Ref Range Status   Specimen Description BLOOD LEFT ANTECUBITAL  Final   Special Requests BOTTLES DRAWN AEROBIC AND ANAEROBIC 5ML  Final   Culture  Setup Time   Final    GRAM NEGATIVE RODS IN BOTH AEROBIC AND ANAEROBIC BOTTLES CRITICAL RESULT CALLED TO, READ BACK BY AND VERIFIED WITH: [name] @0235  01/07/15 [name]    Culture   Final    KLEBSIELLA PNEUMONIAE SUSCEPTIBILITIES PERFORMED ON PREVIOUS CULTURE WITHIN THE LAST 5 DAYS. Performed at [facility]    Report Status 01/10/2015 FINAL  Final  Urine culture     Status: None   Collection Time: 01/06/15 10:44 AM  Result Value Ref Range Status   Specimen Description URINE, CATHETERIZED  Final   Special Requests NONE  Final   Culture   Final    NO GROWTH 1 DAY Performed at [facility]    Report Status 01/07/2015 FINAL  Final  Culture, blood (routine x 2)     Status: None   Collection Time: 01/06/15 11:00 AM  Result Value Ref Range Status   Specimen Description BLOOD PORTA CATH  Final   Special Requests BOTTLES DRAWN AEROBIC AND ANAEROBIC 5CC  Final   Culture  Setup  Time   Final    GRAM NEGATIVE RODS IN BOTH AEROBIC AND ANAEROBIC BOTTLES CRITICAL RESULT CALLED TO, READ BACK BY AND VERIFIED WITH: [name] RN 2025 01/06/15 [name]    Culture   Final    KLEBSIELLA PNEUMONIAE ESCHERICHIA COLI Performed at [facility]    Report Status 01/09/2015 FINAL  Final   Organism ID, Bacteria KLEBSIELLA PNEUMONIAE  Final   Organism ID, Bacteria ESCHERICHIA COLI  Final      Susceptibility   Escherichia coli - MIC*    AMPICILLIN  8 SENSITIVE Sensitive     CEFAZOLIN <=4 SENSITIVE Sensitive     CEFEPIME <=1 SENSITIVE Sensitive     CEFTAZIDIME <=1 SENSITIVE Sensitive     CEFTRIAXONE <=1 SENSITIVE Sensitive     CIPROFLOXACIN <=0.25 SENSITIVE Sensitive     GENTAMICIN <=1 SENSITIVE Sensitive     IMIPENEM <=0.25 SENSITIVE Sensitive     TRIMETH/SULFA <=20 SENSITIVE Sensitive     AMPICILLIN/SULBACTAM 4 SENSITIVE Sensitive     PIP/TAZO <=4 SENSITIVE Sensitive     * ESCHERICHIA COLI   Klebsiella pneumoniae - MIC*    AMPICILLIN >=32 RESISTANT Resistant     CEFAZOLIN <=4 SENSITIVE Sensitive     CEFEPIME <=1 SENSITIVE Sensitive     CEFTAZIDIME <=1 SENSITIVE Sensitive     CEFTRIAXONE <=1 SENSITIVE Sensitive     CIPROFLOXACIN <=0.25 SENSITIVE Sensitive     GENTAMICIN <=1 SENSITIVE Sensitive     IMIPENEM <=0.25 SENSITIVE Sensitive     TRIMETH/SULFA <=20 SENSITIVE Sensitive     AMPICILLIN/SULBACTAM 4 SENSITIVE Sensitive     PIP/TAZO <=4 SENSITIVE Sensitive     * KLEBSIELLA PNEUMONIAE  MRSA PCR Screening     Status: None   Collection Time: 01/06/15  4:38 PM  Result Value Ref Range Status   MRSA by PCR NEGATIVE NEGATIVE Final    Comment:        The GeneXpert MRSA Assay (FDA approved for NASAL specimens only), is one component of a comprehensive MRSA colonization surveillance program. It is not intended to diagnose MRSA infection nor to guide or monitor treatment for MRSA infections.   Culture, body fluid-bottle     Status: None (Preliminary result)    Collection Time: 01/06/15  7:12 PM  Result Value Ref Range Status   Specimen Description FLUID BILE  Final   Special Requests RUQ  Final   Gram Stain   Final    GRAM VARIABLE ROD IN BOTH AEROBIC AND ANAEROBIC BOTTLES CRITICAL RESULT CALLED TO, READ BACK BY AND VERIFIED WITH: BARHAM @0509  01/07/15 [name]    Culture   Final    ENTEROBACTER CLOACAE ENTEROBACTER CLOACAE, DIFFERENT SUSCEPTIBILITIES VERIFYING SENSITIVITIES FOR ORG 2 Performed at [facility]    Report Status PENDING  Incomplete   Organism ID, Bacteria ENTEROBACTER CLOACAE  Final      Susceptibility   Enterobacter cloacae - MIC*    CEFAZOLIN >=64 RESISTANT Resistant     CEFEPIME <=1 SENSITIVE Sensitive     CEFTAZIDIME >=64 RESISTANT Resistant     CEFTRIAXONE >=64 RESISTANT Resistant     CIPROFLOXACIN <=0.25 SENSITIVE Sensitive     GENTAMICIN <=1 SENSITIVE Sensitive     IMIPENEM <=0.25 SENSITIVE Sensitive     TRIMETH/SULFA <=20 SENSITIVE Sensitive     PIP/TAZO >=128 RESISTANT Resistant     * ENTEROBACTER CLOACAE  Gram stain     Status: None   Collection Time: 01/06/15  7:12 PM  Result Value Ref Range Status   Specimen Description FLUID BILE  Final   Special Requests RUQ  Final   Gram Stain   Final    CYTOSPIN SMEAR BUDDING YEAST SEEN GRAM VARIABLE ROD GRAM POSITIVE COCCI IN CLUSTERS CRITICAL RESULT CALLED TO, READ BACK BY AND VERIFIED WITH: [name] @0235  01/07/15 [name]    Report Status 01/07/2015 FINAL  Final  Culture, blood (routine x 2)     Status: None (Preliminary result)   Collection Time: 01/09/15 12:20 PM  Result Value Ref Range Status   Specimen Description BLOOD RIGHT ARM  Final  Special Requests IN PEDIATRIC BOTTLE 5CC  Final   Culture   Final    NO GROWTH < 24 HOURS Performed at [facility]    Report Status PENDING  Incomplete  Culture, blood (routine x 2)     Status: None (Preliminary result)   Collection Time: 01/09/15 12:34 PM  Result Value Ref Range Status   Specimen  Description BLOOD RIGHT ARM  Final   Special Requests BOTTLES DRAWN AEROBIC AND ANAEROBIC 10CC  Final   Culture   Final    NO GROWTH < 24 HOURS Performed at [facility]    Report Status PENDING  Incomplete     Studies: No results found.  Scheduled Meds: . antiseptic oral rinse  7 mL Mouth Rinse q12n4p  . chlorhexidine  15 mL Mouth Rinse BID  . ciprofloxacin  750 mg Oral BID  . feeding supplement  1 Container Oral BID BM  . heparin  5,000 Units Subcutaneous 3 times per day  . morphine  15 mg Oral Q12H   Continuous Infusions:    Active Problems:   Gastric cancer ([place])   Septic shock ([facility])   Arterial hypotension   Biliary obstruction   Malnutrition of moderate degree   Sepsis ([place])    Time spent: 25 minutes/     Sharmila Wrobleski  [facility] Pager [phone]  If 7PM-7AM, please contact night-coverage at [email or web address], password [facility] 01/10/2015, 3:18 PM  LOS: 4 days

## 2015-01-10 NOTE — Progress Notes (Addendum)
ANTIBIOTIC CONSULT NOTE - FOLLOW UP  Pharmacy Consult for Cipro Indication: Bacteremia  Allergies  Allergen Reactions  . Compazine [Prochlorperazine Maleate] Other (See Comments)    Dystonia  . Azithromycin Rash  . Sulfa Antibiotics Rash  . Hydrocodone-Acetaminophen Nausea And Vomiting    Hallucinating   . 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 SHE DOES NOT WANT ANY BLOOD PRODUCTS OR FRACTIONS    Patient Measurements: Height: 5\' 6"  (167.6 cm) Weight: 143 lb 8.3 oz (65.1 kg) IBW/kg (Calculated) : 59.3  Vital Signs: Temp: 98.9 F (37.2 C) (10/16 0529) Temp Source: Oral (10/16 0529) BP: 103/60 mmHg (10/16 0529) Pulse Rate: 91 (10/16 0529) Intake/Output from previous day: 10/15 0701 - 10/16 0700 In: 8303 [P.O.:1080; I.V.:6979; IV Piggyback:244] Out: 995 [Drains:995]  Labs:  Recent Labs  01/08/15 0430 01/09/15 1200 01/10/15 0608  WBC 19.2*  --  10.3  HGB 7.7*  --  7.6*  PLT 136*  --  171  CREATININE 0.75 0.79 0.90   Estimated Creatinine Clearance: 76.2 mL/min (by C-G formula based on Cr of 0.9).  Assessment: 44yo F admitted 10/12 with weakness, low back pain, diarrhea. On presentation, she was hypotensive, febrile to 100.9, with elevated LA and WBCs.  She has a central line for TPN at home as well as a RUQ biliary drain for hx of intra- abdominal abscesses.  Pharmacy was initially consulted to dose vancomycin and Zosyn for suspected sepsis, but was narrowed to Zosyn alone.  10/12 >> Vanc >> 10/13 10/12 >> Zosyn >> 10/16 10/16 >> Cipro PO >>  Today, 01/10/2015: Day #5 Zosyn  Tm 99.8  WBC elevated but improved to WNL  SCr 0.9 with CrCl 76 ml/min.  Blood cultures growing Klebsiella (pan-sens to all tested), Ecoli (pan-sens to all tested except ampicillin)  Bile fluid culture with 2 different Enterobacter Cloacae organisms.  Org 1 (sens: cefepime, cipro, gent, imipenem, bactrim), Org 2 (sens pending).  Goal of Therapy:  Appropriate abx dosing, eradication of  infection.   Plan:   Change to Cipro 750mg  PO BID.    Follow up renal fxn, culture results, and clinical course.  Gretta Arab PharmD, BCPS Pager [phone] 01/10/2015 11:45 AM

## 2015-01-10 NOTE — Progress Notes (Signed)
Referring Physician(s): Dr Benay Spice [facility]  Chief Complaint:  Gastric Ca Bili obstruction  Subjective:  Bili drain placed 11/19/14 exch 9/29 Output great Pt feeling better daily +BC  Allergies: Compazine; Azithromycin; Sulfa antibiotics; Hydrocodone-acetaminophen; and Other  Medications: Prior to Admission medications   Medication Sig Start Date End Date Taking? Authorizing Provider  dexamethasone (DECADRON) 4 MG tablet Take 2 tablets (8 mg total) by mouth 2 (two) times daily. Take 8 mg twice a day for two days beginning the day after chemotherapy. 12/23/14  Yes Owens Shark, NP  DiphenhydrAMINE HCl (ZZZQUIL) 50 MG/30ML LIQD Take 30 mLs by mouth at bedtime as needed (for sleep).   Yes Historical Provider, MD  ibuprofen (ADVIL,MOTRIN) 200 MG tablet Take 200-600 mg by mouth every 6 (six) hours as needed for headache or moderate pain.   Yes Historical Provider, MD  lidocaine-prilocaine (EMLA) cream Appy small amount of cream over port site 1-2 hours prior to treatment and cover with plastic wrap.  DO NOT RUB IN Patient taking differently: Apply 1 application topically daily as needed. Appy small amount of cream over port site 1-2 hours prior to treatment and cover with plastic wrap.  DO NOT RUB IN 03/18/14  Yes Owens Shark, NP  LORazepam (ATIVAN) 0.5 MG tablet Take 1 tablet (0.5 mg total) by mouth every 8 (eight) hours as needed (nausea). May take oral or sublingual every 8 hours as needed for nausea 12/23/14  Yes Owens Shark, NP  morphine (MS CONTIN) 15 MG 12 hr tablet Take 1 tablet (15 mg total) by mouth every 12 (twelve) hours. 12/23/14  Yes Owens Shark, NP  ondansetron (ZOFRAN) 4 MG tablet Take 1 tablet (4 mg total) by mouth every 8 (eight) hours as needed for nausea or vomiting. 12/02/14  Yes Owens Shark, NP  oxyCODONE-acetaminophen (PERCOCET/ROXICET) 5-325 MG per tablet Take 1 tablet by mouth every 4 (four) hours as needed for severe pain. 12/16/14  Yes Owens Shark, NP    [place]   Yes Historical Provider, MD  TPN ADULT Inject into the vein continuous. [place]   Yes Historical Provider, MD  hydroxychloroquine (PLAQUENIL) 200 MG tablet Take 1 tablet (200 mg total) by mouth 2 (two) times daily. Patient not taking: Reported on 12/24/2014 09/17/14   Ladell Pier, MD     Vital Signs: BP 103/60 mmHg  Pulse 91  Temp(Src) 98.9 F (37.2 C) (Oral)  Resp 16  Ht 5\' 6"  (1.676 m)  Wt 143 lb 8.3 oz (65.1 kg)  BMI 23.18 kg/m2  SpO2 98%  LMP 11/23/2014 (Approximate)  Physical Exam  Abdominal: Soft. Bowel sounds are normal.  Site of Bili drain clean and dry NT no bleeding Output great 1 liter yesterday: bilious afeb Wbc wnl BC +  Skin: Skin is warm and dry.  Nursing note and vitals reviewed.   Imaging: Dg Chest 2 View  01/06/2015  CLINICAL DATA:  Weakness and fatigue starting last night. Low back pain. Diarrhea. Sepsis. Chemotherapy. Lupus. EXAM: CHEST  2 VIEW COMPARISON:  11/21/2014 FINDINGS: Power injectable Port-A-Cath tip:  Cavoatrial junction. Low lung volumes are present, causing crowding of the pulmonary vasculature. Bandlike airspace opacity along the right hemidiaphragm favoring atelectasis based on configuration. No pleural effusion identified. Pigtail drain over the right upper quadrant compatible with biliary drain. IMPRESSION: 1. Bandlike opacity at the right lung base favoring atelectasis based on configuration. 2. Percutaneous biliary drain. 3. Port-A-Cath tip:  Cavoatrial junction. Electronically Signed   By: Van Clines M.D.   On: 01/06/2015 12:02   Ct Abdomen Pelvis W Contrast  01/06/2015  CLINICAL DATA:  Current history of gastric cancer. Fever. Low back pain. EXAM: CT ABDOMEN AND PELVIS WITH CONTRAST TECHNIQUE: Multidetector CT imaging of the abdomen and pelvis was performed using the standard protocol following bolus administration of intravenous contrast. CONTRAST:  126mL  OMNIPAQUE IOHEXOL 300 MG/ML  SOLN COMPARISON:  CT scan of November 22, 2014. FINDINGS: Visualized lung bases are unremarkable. No significant osseous abnormality is noted. No gallstones are noted. Percutaneous biliary drainage catheter is again noted and unchanged in position compared to prior exam. No significant change in mild intrahepatic biliary dilatation is noted. Stable splenomegaly is noted. Stable mild pancreatic ductal dilatation is noted. Status post partial gastrectomy. No evidence of bowel obstruction is noted. The appendix appears normal. Adrenal glands and left kidney appear normal. Mild right hydronephrosis and proximal ureteral dilatation is noted ; there is noted an area of abnormal enhancement involving the mid ureter which may represent neoplastic involvement. Stable left ovarian cyst is noted. Stable fibroid is noted in uterine fundus. No abnormal fluid collection is noted. Urinary bladder appears normal. There is continued presence of abnormal soft tissue density seen along the anterior abdominal wall which is decreased compared to prior exam, consistent with improving peritoneal metastatic focus. Stable soft tissue density is seen in the porta hepatis region consistent with metastatic disease. IMPRESSION: Stable splenomegaly. Percutaneous biliary drainage catheter is unchanged in position compared to prior exam, with no significant change in mild intrahepatic biliary dilatation or pancreatic ductal dilatation. Status post partial gastrectomy. Stable left ovarian cyst is noted compared to prior exam. Continued presence of abnormal soft tissue density seen along the anterior abdominal wall which is decreased compared to prior exam, consistent with improving peritoneal metastatic focus. Stable soft tissue density seen in porta hepatis region consistent with metastatic disease. Interval development of mild right hydronephrosis and proximal ureteral dilatation without obstructing calculus. Enhancing  soft tissue abnormality is seen involving the midportion of the right ureter concerning for metastatic disease. Electronically Signed   By: Marijo Conception, M.D.   On: 01/06/2015 13:36    Labs:  CBC:  Recent Labs  01/06/15 1035 01/07/15 0300 01/08/15 0430 01/10/15 0608  WBC 27.9* 31.2* 19.2* 10.3  HGB 9.4* 8.0* 7.7* 7.6*  HCT 28.4* 24.4* 23.3* 23.7*  PLT 146* 143* 136* 171    COAGS:  Recent Labs  06/24/14 1142 11/19/14 0714 12/24/14 0905 01/06/15 1450  INR 1.04 1.15 1.18 1.56*  APTT 30  --   --  36    BMP:  Recent Labs  01/07/15 0300 01/08/15 0430 01/09/15 1200 01/10/15 0608  NA 141 140 139 141  K 3.7 3.2* 3.0* 3.8  CL 115* 115* 112* 111  CO2 21* 20* 23 25  GLUCOSE 129* 102* 106* 91  BUN 12 9 <5* <5*  CALCIUM 7.9* 8.0* 8.0* 8.5*  CREATININE 0.73 0.75 0.79 0.90  GFRNONAA >60 >60 >60 >60  GFRAA >60 >60 >60 >60    LIVER FUNCTION TESTS:  Recent Labs  12/24/14 0905 01/06/15 0918 01/06/15 1035 01/07/15 0300  BILITOT 0.7 1.12 2.1* 0.8  AST 47* 533* 536* 206*  ALT 32 250* 239* 143*  ALKPHOS 174* 384* 341* 245*  PROT 9.4* 8.2 8.2* 6.7  ALBUMIN 3.2* 2.5* 2.6* 2.2*    Assessment and Plan:  Bili drain intact Draining well Pt better daily Scheduled for OP  visit with IR clinic for drain check and possible metal stent placement after Thanksgiving To follow with Dr Darryl Lent [place] if need exch before then  Signed: Felicity Penix A 01/10/2015, 10:28 AM   I spent a total of 15 Minutes at the the patient's bedside AND on the patient's hospital floor or unit, greater than 50% of which was counseling/coordinating care for bili drain

## 2015-01-11 ENCOUNTER — Other Ambulatory Visit: Payer: Self-pay | Admitting: *Deleted

## 2015-01-11 DIAGNOSIS — C163 Malignant neoplasm of pyloric antrum: Secondary | ICD-10-CM

## 2015-01-11 LAB — CULTURE, BODY FLUID-BOTTLE

## 2015-01-11 LAB — CULTURE, BODY FLUID W GRAM STAIN -BOTTLE

## 2015-01-11 MED ORDER — CIPROFLOXACIN HCL 750 MG PO TABS: 750.0000 mg | ORAL_TABLET | Freq: Two times a day (BID) | ORAL | Status: DC

## 2015-01-11 MED ORDER — HYDROXYCHLOROQUINE SULFATE 200 MG PO TABS
200.0000 mg | ORAL_TABLET | Freq: Two times a day (BID) | ORAL | Status: DC
Start: 2015-01-21 — End: 2015-02-12

## 2015-01-11 MED ORDER — HEPARIN SOD (PORK) LOCK FLUSH 100 UNIT/ML IV SOLN
500.0000 [IU] | INTRAVENOUS | Status: AC | PRN
  Administered 2015-01-11: 500 [IU]
  Filled 2015-01-11: qty 5

## 2015-01-11 MED ORDER — BOOST / RESOURCE BREEZE PO LIQD: 1.0000 | Freq: Two times a day (BID) | ORAL | Status: DC

## 2015-01-11 MED ORDER — FERROUS SULFATE 325 (65 FE) MG PO TABS
325.0000 mg | ORAL_TABLET | Freq: Two times a day (BID) | ORAL | Status: DC
Start: 2015-01-11 — End: 2015-01-11

## 2015-01-11 NOTE — Progress Notes (Signed)
Patient ID: Andrea Best, female   DOB: 1971-01-30, 44 y.o.   MRN: [id number]           Subjective: Patient doing well. Denies new complaints.   Allergies: Compazine; Azithromycin; Sulfa antibiotics; Hydrocodone-acetaminophen; and Other  Medications: Prior to Admission medications   Medication Sig Start Date End Date Taking? Authorizing Provider  dexamethasone (DECADRON) 4 MG tablet Take 2 tablets (8 mg total) by mouth 2 (two) times daily. Take 8 mg twice a day for two days beginning the day after chemotherapy. 12/23/14  Yes Owens Shark, NP  DiphenhydrAMINE HCl (ZZZQUIL) 50 MG/30ML LIQD Take 30 mLs by mouth at bedtime as needed (for sleep).   Yes Historical Provider, MD  ibuprofen (ADVIL,MOTRIN) 200 MG tablet Take 200-600 mg by mouth every 6 (six) hours as needed for headache or moderate pain.   Yes Historical Provider, MD  lidocaine-prilocaine (EMLA) cream Appy small amount of cream over port site 1-2 hours prior to treatment and cover with plastic wrap.  DO NOT RUB IN Patient taking differently: Apply 1 application topically daily as needed. Appy small amount of cream over port site 1-2 hours prior to treatment and cover with plastic wrap.  DO NOT RUB IN 03/18/14  Yes Owens Shark, NP  LORazepam (ATIVAN) 0.5 MG tablet Take 1 tablet (0.5 mg total) by mouth every 8 (eight) hours as needed (nausea). May take oral or sublingual every 8 hours as needed for nausea 12/23/14  Yes Owens Shark, NP  morphine (MS CONTIN) 15 MG 12 hr tablet Take 1 tablet (15 mg total) by mouth every 12 (twelve) hours. 12/23/14  Yes Owens Shark, NP  ondansetron (ZOFRAN) 4 MG tablet Take 1 tablet (4 mg total) by mouth every 8 (eight) hours as needed for nausea or vomiting. 12/02/14  Yes Owens Shark, NP  oxyCODONE-acetaminophen (PERCOCET/ROXICET) 5-325 MG per tablet Take 1 tablet by mouth every 4 (four) hours as needed for severe pain. 12/16/14  Yes Owens Shark, NP  [place]    Yes Historical Provider, MD  TPN ADULT Inject into the vein continuous. [place]   Yes Historical Provider, MD  hydroxychloroquine (PLAQUENIL) 200 MG tablet Take 1 tablet (200 mg total) by mouth 2 (two) times daily. Patient not taking: Reported on 12/24/2014 09/17/14   Ladell Pier, MD     Vital Signs: BP 105/61 mmHg  Pulse 90  Temp(Src) 98.9 F (37.2 C) (Oral)  Resp 16  Ht 5\' 6"  (1.676 m)  Wt 139 lb 8.8 oz (63.3 kg)  BMI 22.53 kg/m2  SpO2 98%  LMP 11/23/2014 (Approximate)  Physical Exam internal/external biliary drain intact, insertion site okay, nontender, output 200 mL green bile; bile cultures-multi drug resistant Enterobacter  Imaging: No results found.  Labs:  CBC:  Recent Labs  01/06/15 1035 01/07/15 0300 01/08/15 0430 01/10/15 0608  WBC 27.9* 31.2* 19.2* 10.3  HGB 9.4* 8.0* 7.7* 7.6*  HCT 28.4* 24.4* 23.3* 23.7*  PLT 146* 143* 136* 171    COAGS:  Recent Labs  06/24/14 1142 11/19/14 0714 12/24/14 0905 01/06/15 1450  INR 1.04 1.15 1.18 1.56*  APTT 30  --   --  36    BMP:  Recent Labs  01/07/15 0300 01/08/15 0430 01/09/15 1200 01/10/15 0608  NA 141 140 139 141  K 3.7 3.2* 3.0* 3.8  CL 115* 115* 112* 111  CO2 21* 20* 23 25  GLUCOSE 129* 102* 106* 91  BUN 12 9 <5* <5*  CALCIUM 7.9* 8.0* 8.0* 8.5*  CREATININE 0.73 0.75 0.79 0.90  GFRNONAA >60 >60 >60 >60  GFRAA >60 >60 >60 >60    LIVER FUNCTION TESTS:  Recent Labs  12/24/14 0905 01/06/15 0918 01/06/15 1035 01/07/15 0300  BILITOT 0.7 1.12 2.1* 0.8  AST 47* 533* 536* 206*  ALT 32 250* 239* 143*  ALKPHOS 174* 384* 341* 245*  PROT 9.4* 8.2 8.2* 6.7  ALBUMIN 3.2* 2.5* 2.6* 2.2*    Assessment and Plan: 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, s/p I/E biliary drain 11/19/14 with last exchange on 12/24/14; admitted 10/12 under sepsis protocol;afebrile; latest  t bili pend; K 3.8, creat ok, WBC nl, HGB 7.6;  bile cx's-  multidrug-resistant Enterobacter; for now rec cont drainage of biliary system; can consider biliary stent in future depending on how pt responds to chemotherapy and completes treatment for biliary sepsis; keep scheduled appt for drain exchange on 02/23/15; replace electrolytes/fluids appropriately as OP since biliary system has been attached back to gravity bag; flush drain with 5-10 mL sterile normal saline once daily as outpatient. Will also discuss case with Dr. Laurence Ferrari on 10/18 for any additional input.   Signed: D. Rowe Robert 01/11/2015, 12:22 PM   I spent a total of 15 minutes at the the patient's bedside AND on the patient's hospital floor or unit, greater than 50% of which was counseling/coordinating care for biliary drain

## 2015-01-11 NOTE — Progress Notes (Signed)
IP PROGRESS NOTE  Subjective:   She reports feeling well. She is ambulating and tolerating a diet.  Objective: Vital signs in last 24 hours: Blood pressure 105/61, pulse 90, temperature 98.9 F (37.2 C), temperature source Oral, resp. rate 16, height 5' 6"  (1.676 m), weight 139 lb 8.8 oz (63.3 kg), last menstrual period 11/23/2014, SpO2 98 %.  Intake/Output from previous day: 10/16 0701 - 10/17 0700 In: 1560.2 [P.O.:480; I.V.:1080.2] Out: 755 [Drains:755]  Physical Exam:  HEENT: No thrush Lungs: Clear bilaterally Cardiac: Regular rate and rhythm Abdomen: Soft and nontender, no mass, right upper abdomen drain site without evidence of infection Extremities: No leg edema Neurologic: Alert and oriented  Portacath/PICC-without erythema  Lab Results:  Recent Labs  01/10/15 0608  WBC 10.3  HGB 7.6*  HCT 23.7*  PLT 171    BMET  Recent Labs  01/09/15 1200 01/10/15 0608  NA 139 141  K 3.0* 3.8  CL 112* 111  CO2 23 25  GLUCOSE 106* 91  BUN <5* <5*  CREATININE 0.79 0.90  CALCIUM 8.0* 8.5*    Studies/Results: No results found.  Medications: I have reviewed the patient's current medications.  Assessment/Plan:  1. Gastric cancer status post upper endoscopy 09/22/2013 with findings of a partially obstructing oozing cratered gastric ulcer in the gastric antrum.  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   CT scans chest/abdomen/pelvis 10/03/2013 showed a 7 x 5 mm groundglass opacity in the superior segment of the right lower lobe; a 4.5 mm nodular density in the right upper lobe; bilateral axillary adenopathy; low density area anteriorly in the left hepatic lobe most consistent with fatty infiltration; another hypodense area within the medial segment of the left hepatic lobe; severe gastric distention; mild bilateral inguinal adenopathy.   Subtotal gastrectomy and lymph node dissection with creation of a gastrojejunostomy  10/10/2013. No evidence of distant metastatic disease noted at the time of surgery. No evidence of liver metastases. 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 Tumor involved the serosal surface. Resection margins were negative. 10 of 17 lymph nodes were positive for metastatic adenocarcinoma. Her-2 not amplified   Cycle 1 weekly 5-FU/leucovorin 11/14/2013, dose reduced beginning with week 3 secondary to mucositis and hand/foot syndrome.   Cycle 2 weekly 5-FU/leucovorin beginning 12/19/2013.  Initiation of radiation/Xeloda 01/26/2014. She decided to discontinue further radiation/Xeloda after one fraction due to significant nausea/vomiting.  Cycle 3 weekly 5-FU/leucovorin beginning 02/13/2014  Cycle 4 weekly 5-FU/leucovorin beginning 03/18/2014  Cycle 5 weekly 5-FU/leucovorin beginning 04/28/2014  CTs 06/16/2014 with new enhancing nodular lesions along the ventral peritoneal surface  CT-guided biopsy of an anterior peritoneal lesion on 06/24/2014 confirmed poorly differentiated adenocarcinoma  CTs 09/03/2014 with an increasing confluent enhancing lesion located at the ventral peritoneal surface midline of the anterior abdomen. Lesion inseparable from the posterior aspect of the abdominal wall.  CT abdomen/pelvis 11/18/2014 with significant progression of metastatic disease with enlarging metastasis within the anterior abdominal wall, enlargement of multiple peritoneal implants, interval development of masses within the pancreatic head and caudate lobe of the liver. 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 New complex solid and cystic left adnexal lesion.  Placement of percutaneous internal/external biliary drain 11/19/2014  Cycle 1 FOLFOX 12/09/2014  Cycle 2 FOLFOX 12/23/2014  CT 01/06/2015 with a decrease in the anterior abdominal wall tumor 2. Gastric outlet  obstruction secondary to #1. Resolved. 3. Nausea/vomiting secondary to #2. Resolved. 4. Abdominal pain secondary to #  1. 5. History of weight loss. 6. Microcytic anemia, likely iron deficiency. She received iron dextran 10/11/2013. 7. Lupus/Sjgren's. 8. Pneumonia during hospitalization July 2015.  9. Abdominal abscesses with body fluid culture 10/20/2013 showing moderate Candida tropicalis. She was discharged home on a 4 week course of fluconazole.  10. Mucositis and hand/foot syndrome following cycle 1-week #3 5-FU/leucovorin, the 5-FU and leucovorin were dose reduced. 11. 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 12. Status post LEEP 11/07/2013. 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 Pap smear 06/08/2014-negative for intraepithelial lesions or malignancy 13. Gram-negative sepsis 01/06/2015-blood cultures positive for Klebsiella/Escherichia coli, culture from biliary drainage positive for Enterobacter 14. New right Hydronephrosis on CT 01/06/2015  She appears much improved from hospital admission. Repeat blood cultures are negative. The biliary tract is the likely source for infection. She appears stable for discharge on oral antibiotics. I recommend leaving the biliary drain open.  Recommendations: 1. Continue antibiotics per the internal medicine service 2. Management of biliary drain per interventional radiology 3. Continue diet as tolerated 4. Outpatient follow-up for an office visit and chemotherapy is scheduled at the Cancer center for 01/20/2015. 5. Begin oral iron therapy    LOS: 5 days   Rorik Vespa  01/11/2015, 2:20 PM

## 2015-01-11 NOTE — Progress Notes (Signed)
Nursing Discharge Summary  Patient ID: Andrea Best MRN: [id number] DOB/AGE: [birth year] 44 y.o.  Admit date: 01/06/2015 Discharge date: 01/11/2015  Discharged Condition: good  Disposition: 01-Home or Self Care  Follow-up Information    Follow up with Milagros Evener, MD. Schedule an appointment as soon as possible for a visit in 1 week.   Specialty:  Family Medicine   Contact information:   [address] [place] [address] [phone]       Prescriptions Given: Discussed medications and follow up appointments with patient.  Verbalized understanding without further questions.  Patient given cipro prescription.   Means of Discharge: patient to be taken downstairs via wheelchair to be discharged via private vehicle.   Signed: Buel Ream 01/11/2015, 4:46 PM

## 2015-01-12 ENCOUNTER — Telehealth: Payer: Self-pay | Admitting: *Deleted

## 2015-01-12 DIAGNOSIS — C163 Malignant neoplasm of pyloric antrum: Secondary | ICD-10-CM

## 2015-01-12 NOTE — Telephone Encounter (Signed)
Call from [place] with [facility] asking if pt's nursing service should resume since she's been discharged home. Reviewed with Dr. Benay Spice: will need to see if pt is able to manage adequate nutrition orally. May need to resume TPN. Called pt, she reports eating well today. Thinks it is too early to tell whether she'll be able to manage nutrition. Pt would like to continue nursing for now to assist with biliary drain. Order placed, called to [facility] with [facility].

## 2015-01-12 NOTE — Discharge Summary (Signed)
Physician Discharge Summary  Andrea Best KXF:[id number] DOB: [birth year] DOA: 01/06/2015  PCP: Milagros Evener, MD  Admit date: 01/06/2015 Discharge date: 01/11/2015  Time spent: 30  minutes  Recommendations for Outpatient Follow-up:  1. Follow upw ith PCP as needed.  2. Please follow up with IR and their recommendations to flush the biliary drain every day with 5 to 10 ml sterile normal saline .  3. Please follow up with Dr Learta Codding as recommended.  4. Complete the course of ciprofloxacin for the gram negative bacteremia.    Discharge Diagnoses:  Active Problems:   Gastric cancer ([place])   Septic shock ([facility])   Arterial hypotension   Biliary obstruction   Malnutrition of moderate degree   Sepsis ([place])   Discharge Condition: improved  Diet recommendation: regular diet.   Filed Weights   01/09/15 0443 01/10/15 0554 01/11/15 0501  Weight: 69.2 kg (152 lb 8.9 oz) 65.1 kg (143 lb 8.3 oz) 63.3 kg (139 lb 8.8 oz)    History of present illness:  44 yo lady with h/o gastric cancer s/p resection presents with sepsis. CT abdomen and pelvis showed Rt hydronephrosis with ureteral dilation w/o obstructing stone. Questionable soft-tissue enhancement suggesting metastatic involvement . Urology consulted and recommended outpatient follow up with Lasix renal scan to assess degree of possible obstruction of the right kidney. Meanwhile she was started on levophed and was transitioned off vasopressors and transferred the care to [facility] on 10/14. Her blood cultures grew gram negative rods and her cultures from bile grew  carbepenamase producing enterobacter. . Gram negative rods in blood cultures grew e coli and klebsiella pan sensitive.  Hospital Course:  1. Sepsis from gram negative bacteremia possibly coming from biliary drain, other sites include port a cath and hydronephrosis. - much improved and transferred the patient to medical service.  - levo phed gtt off and stable blood pressures.  lactic acid levels normal.- cultures growing gram negative rods, grew e coli and klebsiella pan sensitive . Changed antibiotic from zosyn to ciprofloxacin. Cultures from the biliary drain growing enterobacter sensitive to ciprofloxacin. Repeat blood cultures ordered NEGATIVE so far.    2. Right sided hydronephrosis with possible metastatic involvement: Urology and IR consulted , outpatient follow up with urology for a lasix renal scan to assess the obstruction vs stent placement.    3. Hypokalemia: replete as needed.  Repeat in am is normal.    4. Gastric cancer: S/p biliary obstruction :s/p I/E biliary drain on 8/25, .  Further management as per Dr Learta Codding and IR.   5. 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 Her hemoglobin has dropped to 7. Anemia panel revealed anemia of chronic disease.  Iron tablets added by oncology.    Lupus : Plaquenil on hold. Plan to resume after the antibiotic course.    Leukocytosis: improving.     Procedures:  none  Consultations:  IR  Oncology  pCCM.  Discharge Exam: Filed Vitals:   01/11/15 1432  BP: 116/69  Pulse: 94  Temp: 98.7 F (37.1 C)  Resp: 18   General: alert afebrile comfortable Cardiovascular: s1s2 Respiratory: ctab  Discharge Instructions   Discharge Instructions    Call MD for:  difficulty breathing, headache or visual disturbances    Complete by:  As directed      Call MD for:  persistant nausea and vomiting    Complete by:  As directed      Call MD for:  redness, tenderness, or signs of infection (pain,  swelling, redness, odor or green/yellow discharge around incision site)    Complete by:  As directed      Call MD for:  severe uncontrolled pain    Complete by:  As directed      Call MD for:  temperature >100.4    Complete by:  As directed      Discharge instructions    Complete by:  As directed   Follow up with oncology as recommended.          Discharge Medication List as of  01/11/2015  3:18 PM    START taking these medications   Details  ciprofloxacin (CIPRO) 750 MG tablet Take 1 tablet (750 mg total) by mouth 2 (two) times daily., Starting 01/11/2015, Until Discontinued, Print    feeding supplement (BOOST / RESOURCE BREEZE) LIQD Take 1 Container by mouth 2 (two) times daily between meals., Starting 01/11/2015, Until Discontinued, No Print      CONTINUE these medications which have CHANGED   Details  hydroxychloroquine (PLAQUENIL) 200 MG tablet Take 1 tablet (200 mg total) by mouth 2 (two) times daily., Starting 01/21/2015, Until Discontinued, No Print      CONTINUE these medications which have NOT CHANGED   Details  dexamethasone (DECADRON) 4 MG tablet Take 2 tablets (8 mg total) by mouth 2 (two) times daily. Take 8 mg twice a day for two days beginning the day after chemotherapy., Starting 12/23/2014, Until Discontinued, Normal    DiphenhydrAMINE HCl (ZZZQUIL) 50 MG/30ML LIQD Take 30 mLs by mouth at bedtime as needed (for sleep)., Until Discontinued, Historical Med    ibuprofen (ADVIL,MOTRIN) 200 MG tablet Take 200-600 mg by mouth every 6 (six) hours as needed for headache or moderate pain., Until Discontinued, Historical Med    LORazepam (ATIVAN) 0.5 MG tablet Take 1 tablet (0.5 mg total) by mouth every 8 (eight) hours as needed (nausea). May take oral or sublingual every 8 hours as needed for nausea, Starting 12/23/2014, Until Discontinued, Phone In    morphine (MS CONTIN) 15 MG 12 hr tablet Take 1 tablet (15 mg total) by mouth every 12 (twelve) hours., Starting 12/23/2014, Until Discontinued, Print    ondansetron (ZOFRAN) 4 MG tablet Take 1 tablet (4 mg total) by mouth every 8 (eight) hours as needed for nausea or vomiting., Starting 12/02/2014, Until Discontinued, Normal    PRESCRIPTION MEDICATION Chemo - [place], Until Discontinued, Historical Med    TPN ADULT Inject into the vein continuous. [place], Until Discontinued, Historical  Med      STOP taking these medications     lidocaine-prilocaine (EMLA) cream      oxyCODONE-acetaminophen (PERCOCET/ROXICET) 5-325 MG per tablet        Allergies  Allergen Reactions  . Compazine [Prochlorperazine Maleate] Other (See Comments)    Dystonia  . Azithromycin Rash  . Sulfa Antibiotics Rash  . Hydrocodone-Acetaminophen Nausea And Vomiting    Hallucinating   . 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 SHE DOES NOT WANT ANY BLOOD PRODUCTS OR FRACTIONS   Follow-up Information    Follow up with Milagros Evener, MD. Schedule an appointment as soon as possible for a visit in 1 week.   Specialty:  Family Medicine   Contact information:   [place] [address] [phone]        The results of significant diagnostics from this hospitalization (including imaging, microbiology, ancillary and laboratory) are listed below for reference.    Significant  Diagnostic Studies: Dg Chest 2 View  01/06/2015  CLINICAL DATA:  Weakness and fatigue starting last night. Low back pain. Diarrhea. Sepsis. Chemotherapy. Lupus. EXAM: CHEST  2 VIEW COMPARISON:  11/21/2014 FINDINGS: Power injectable Port-A-Cath tip:  Cavoatrial junction. Low lung volumes are present, causing crowding of the pulmonary vasculature. Bandlike airspace opacity along the right hemidiaphragm favoring atelectasis based on configuration. No pleural effusion identified. Pigtail drain over the right upper quadrant compatible with biliary drain. IMPRESSION: 1. Bandlike opacity at the right lung base favoring atelectasis based on configuration. 2. Percutaneous biliary drain. 3. Port-A-Cath tip:  Cavoatrial junction. Electronically Signed   By: Van Clines M.D.   On: 01/06/2015 12:02   Ct Abdomen Pelvis W Contrast  01/06/2015  CLINICAL DATA:  Current history of gastric cancer. Fever. Low back pain. EXAM: CT ABDOMEN AND PELVIS WITH CONTRAST TECHNIQUE: Multidetector CT imaging of the abdomen and  pelvis was performed using the standard protocol following bolus administration of intravenous contrast. CONTRAST:  164mL OMNIPAQUE IOHEXOL 300 MG/ML  SOLN COMPARISON:  CT scan of November 22, 2014. FINDINGS: Visualized lung bases are unremarkable. No significant osseous abnormality is noted. No gallstones are noted. Percutaneous biliary drainage catheter is again noted and unchanged in position compared to prior exam. No significant change in mild intrahepatic biliary dilatation is noted. Stable splenomegaly is noted. Stable mild pancreatic ductal dilatation is noted. Status post partial gastrectomy. No evidence of bowel obstruction is noted. The appendix appears normal. Adrenal glands and left kidney appear normal. Mild right hydronephrosis and proximal ureteral dilatation is noted ; there is noted an area of abnormal enhancement involving the mid ureter which may represent neoplastic involvement. Stable left ovarian cyst is noted. Stable fibroid is noted in uterine fundus. No abnormal fluid collection is noted. Urinary bladder appears normal. There is continued presence of abnormal soft tissue density seen along the anterior abdominal wall which is decreased compared to prior exam, consistent with improving peritoneal metastatic focus. Stable soft tissue density is seen in the porta hepatis region consistent with metastatic disease. IMPRESSION: Stable splenomegaly. Percutaneous biliary drainage catheter is unchanged in position compared to prior exam, with no significant change in mild intrahepatic biliary dilatation or pancreatic ductal dilatation. Status post partial gastrectomy. Stable left ovarian cyst is noted compared to prior exam. Continued presence of abnormal soft tissue density seen along the anterior abdominal wall which is decreased compared to prior exam, consistent with improving peritoneal metastatic focus. Stable soft tissue density seen in porta hepatis region consistent with metastatic disease.  Interval development of mild right hydronephrosis and proximal ureteral dilatation without obstructing calculus. Enhancing soft tissue abnormality is seen involving the midportion of the right ureter concerning for metastatic disease. Electronically Signed   By: Marijo Conception, M.D.   On: 01/06/2015 13:36   Ir Exchange Biliary Drain  12/24/2014  CLINICAL DATA:  44 year old female with recurrent gastric cancer with malignant biliary obstruction an internal/external percutaneous transhepatic biliary drain. She presents today for drain check, change and possible upsized to facilitate capping. Of note, her bilirubin is now normal. Additionally, her stools have changed back from clay colored to normal. EXAM: IR EXCHANGE BILARY DRAIN Date: 12/24/2014 PROCEDURE: 1. Cholangiogram through existing tube 2. Biliary drain exchange Interventional Radiologist:  Criselda Peaches, MD ANESTHESIA/SEDATION: Moderate (conscious) sedation was used. 1 mg Versed, 75 mcg Fentanyl were administered intravenously. The patient's vital signs were monitored continuously by radiology nursing throughout the procedure. Sedation Time: 13 minutes MEDICATIONS: 3.375 g Zosyn FLUOROSCOPY TIME:  4 minutes 48 mGy CONTRAST:  59mL OMNIPAQUE IOHEXOL 300 MG/ML  SOLN TECHNIQUE: Informed consent was obtained from the patient following explanation of the procedure, risks, benefits and alternatives. The patient understands, agrees and consents for the procedure. All questions were addressed. A time out was performed. Maximal barrier sterile technique utilized including caps, mask, sterile gowns, sterile gloves, large sterile drape, hand hygiene, and Betadine skin prep. The existing tube was injected with contrast material. The tube is clogged. However, there is a small amount of contrast that now passes around the tube, through the native common bile duct and into the duodenum. There is persistent mild intrahepatic biliary ductal dilatation. The tube was  transected and removed over a stiff Glidewire. A new 10 [place] cook biliary drainage catheter was advanced over the wire and formed with the locking loop in the proximal duodenum. Tube placement was confirmed by hand injection of contrast material under fluoroscopy. The tube was secured in place with an 0 Prolene suture. The patient tolerated the procedure well. COMPLICATIONS: None Estimated blood loss:  0 IMPRESSION: 1. A small amount of contrast material now passes through the native common bile duct around the clogged a biliary catheter and into the duodenum. This suggests an interval response to chemotherapy. 2. Exchange for a new 10 [place] percutaneous biliary drain. The drain was left capped. PLAN: Return to IR for biliary drain check and change in 8 weeks. Please discuss with Dr. Benay Spice prior to consideration of placement of an internal metallic stent. Given patient's prior partial gastric resection, she is not a candidate for ERCP to remove the stent should be common clogged. Signed, Criselda Peaches, MD [facility] Radiology Electronically Signed   By: Jacqulynn Cadet M.D.   On: 12/24/2014 17:16    Microbiology: Recent Results (from the past 240 hour(s))  Culture, blood (routine x 2)     Status: None   Collection Time: 01/06/15 10:42 AM  Result Value Ref Range Status   Specimen Description BLOOD LEFT ANTECUBITAL  Final   Special Requests BOTTLES DRAWN AEROBIC AND ANAEROBIC 5ML  Final   Culture  Setup Time   Final    GRAM NEGATIVE RODS IN BOTH AEROBIC AND ANAEROBIC BOTTLES CRITICAL RESULT CALLED TO, READ BACK BY AND VERIFIED WITH: [name] @0235  01/07/15 [name]    Culture   Final    KLEBSIELLA PNEUMONIAE SUSCEPTIBILITIES PERFORMED ON PREVIOUS CULTURE WITHIN THE LAST 5 DAYS. Performed at [facility]    Report Status 01/10/2015 FINAL  Final  Urine culture     Status: None   Collection Time: 01/06/15 10:44 AM  Result Value  Ref Range Status   Specimen Description URINE, CATHETERIZED  Final   Special Requests NONE  Final   Culture   Final    NO GROWTH 1 DAY Performed at [facility]    Report Status 01/07/2015 FINAL  Final  Culture, blood (routine x 2)     Status: None   Collection Time: 01/06/15 11:00 AM  Result Value Ref Range Status   Specimen Description BLOOD PORTA CATH  Final   Special Requests BOTTLES DRAWN AEROBIC AND ANAEROBIC 5CC  Final   Culture  Setup Time   Final    GRAM NEGATIVE RODS IN BOTH AEROBIC AND ANAEROBIC BOTTLES CRITICAL RESULT CALLED TO, READ BACK BY AND VERIFIED WITH: [name] RN 2129 01/06/15 [name]    Culture   Final    KLEBSIELLA PNEUMONIAE ESCHERICHIA COLI Performed at  [facility]    Report Status 01/09/2015 FINAL  Final   Organism ID, Bacteria KLEBSIELLA PNEUMONIAE  Final   Organism ID, Bacteria ESCHERICHIA COLI  Final      Susceptibility   Escherichia coli - MIC*    AMPICILLIN 8 SENSITIVE Sensitive     CEFAZOLIN <=4 SENSITIVE Sensitive     CEFEPIME <=1 SENSITIVE Sensitive     CEFTAZIDIME <=1 SENSITIVE Sensitive     CEFTRIAXONE <=1 SENSITIVE Sensitive     CIPROFLOXACIN <=0.25 SENSITIVE Sensitive     GENTAMICIN <=1 SENSITIVE Sensitive     IMIPENEM <=0.25 SENSITIVE Sensitive     TRIMETH/SULFA <=20 SENSITIVE Sensitive     AMPICILLIN/SULBACTAM 4 SENSITIVE Sensitive     PIP/TAZO <=4 SENSITIVE Sensitive     * ESCHERICHIA COLI   Klebsiella pneumoniae - MIC*    AMPICILLIN >=32 RESISTANT Resistant     CEFAZOLIN <=4 SENSITIVE Sensitive     CEFEPIME <=1 SENSITIVE Sensitive     CEFTAZIDIME <=1 SENSITIVE Sensitive     CEFTRIAXONE <=1 SENSITIVE Sensitive     CIPROFLOXACIN <=0.25 SENSITIVE Sensitive     GENTAMICIN <=1 SENSITIVE Sensitive     IMIPENEM <=0.25 SENSITIVE Sensitive     TRIMETH/SULFA <=20 SENSITIVE Sensitive     AMPICILLIN/SULBACTAM 4 SENSITIVE Sensitive     PIP/TAZO <=4 SENSITIVE Sensitive     * KLEBSIELLA PNEUMONIAE  MRSA PCR Screening      Status: None   Collection Time: 01/06/15  4:38 PM  Result Value Ref Range Status   MRSA by PCR NEGATIVE NEGATIVE Final    Comment:        The GeneXpert MRSA Assay (FDA approved for NASAL specimens only), is one component of a comprehensive MRSA colonization surveillance program. It is not intended to diagnose MRSA infection nor to guide or monitor treatment for MRSA infections.   Culture, body fluid-bottle     Status: None   Collection Time: 01/06/15  7:12 PM  Result Value Ref Range Status   Specimen Description FLUID BILE  Final   Special Requests RUQ  Final   Gram Stain   Final    GRAM VARIABLE ROD IN BOTH AEROBIC AND ANAEROBIC BOTTLES CRITICAL RESULT CALLED TO, READ BACK BY AND VERIFIED WITH: BARHAM @0509  01/07/15 [name]    Culture   Final    ENTEROBACTER CLOACAE MODIFIED HODGE TEST=POSITIVE CRITICAL RESULT CALLED TO, READ BACK BY AND VERIFIED WITH: Orville Govern 01/11/15 @ 1053 M VESTAL Performed at [facility]    Report Status 01/11/2015 FINAL  Final   Organism ID, Bacteria ENTEROBACTER CLOACAE  Final      Susceptibility   Enterobacter cloacae - MIC*    CEFAZOLIN >=64 RESISTANT Resistant     CEFEPIME 2 RESISTANT Resistant     CEFTAZIDIME >=64 RESISTANT Resistant     CEFTRIAXONE >=64 RESISTANT Resistant     CIPROFLOXACIN <=0.25 SENSITIVE Sensitive     GENTAMICIN <=1 SENSITIVE Sensitive     IMIPENEM <=0.25 RESISTANT Resistant     TRIMETH/SULFA <=20 SENSITIVE Sensitive     PIP/TAZO >=128 RESISTANT Resistant     * ENTEROBACTER CLOACAE  Gram stain     Status: None   Collection Time: 01/06/15  7:12 PM  Result Value Ref Range Status   Specimen Description FLUID BILE  Final   Special Requests RUQ  Final   Gram Stain   Final    CYTOSPIN SMEAR BUDDING YEAST SEEN GRAM VARIABLE ROD GRAM POSITIVE COCCI IN CLUSTERS CRITICAL RESULT CALLED TO,  READ BACK BY AND VERIFIED WITH: [name] @0235  01/07/15 [name]    Report Status 01/07/2015 FINAL  Final  Culture, blood  (routine x 2)     Status: None (Preliminary result)   Collection Time: 01/09/15 12:20 PM  Result Value Ref Range Status   Specimen Description BLOOD RIGHT ARM  Final   Special Requests IN PEDIATRIC BOTTLE 5CC  Final   Culture   Final    NO GROWTH 2 DAYS Performed at [facility]    Report Status PENDING  Incomplete  Culture, blood (routine x 2)     Status: None (Preliminary result)   Collection Time: 01/09/15 12:34 PM  Result Value Ref Range Status   Specimen Description BLOOD RIGHT ARM  Final   Special Requests BOTTLES DRAWN AEROBIC AND ANAEROBIC 10CC  Final   Culture   Final    NO GROWTH 2 DAYS Performed at [facility]    Report Status PENDING  Incomplete     Labs: Basic Metabolic Panel:  Recent Labs Lab 01/06/15 1450 01/07/15 0300 01/08/15 0430 01/09/15 1200 01/10/15 0608  NA 135 141 140 139 141  K 3.7 3.7 3.2* 3.0* 3.8  CL 110 115* 115* 112* 111  CO2 18* 21* 20* 23 25  GLUCOSE 128* 129* 102* 106* 91  BUN 14 12 9  <5* <5*  CREATININE 0.94 0.73 0.75 0.79 0.90  CALCIUM 7.4* 7.9* 8.0* 8.0* 8.5*  MG  --  1.7 2.0  --   --   PHOS  --  3.1  --   --   --    Liver Function Tests:  Recent Labs Lab 01/06/15 0918 01/06/15 1035 01/07/15 0300  AST 533* 536* 206*  ALT 250* 239* 143*  ALKPHOS 384* 341* 245*  BILITOT 1.12 2.1* 0.8  PROT 8.2 8.2* 6.7  ALBUMIN 2.5* 2.6* 2.2*    Recent Labs Lab 01/06/15 1450  LIPASE 39    Recent Labs Lab 01/07/15 0300  AMMONIA 37*   CBC:  Recent Labs Lab 01/06/15 0918 01/06/15 1035 01/07/15 0300 01/08/15 0430 01/10/15 0608  WBC 28.4* 27.9* 31.2* 19.2* 10.3  NEUTROABS 25.3* 25.1*  --   --   --   HGB 9.6* 9.4* 8.0* 7.7* 7.6*  HCT 29.5* 28.4* 24.4* 23.3* 23.7*  MCV 80.4 80.5 81.6 81.2 80.3  PLT 165 146* 143* 136* 171   Cardiac Enzymes: No results for input(s): CKTOTAL, CKMB, CKMBINDEX, TROPONINI in the last 168 hours. BNP: BNP (last 3 results)  Recent Labs  01/06/15 1450  BNP 108.2*    ProBNP  (last 3 results) No results for input(s): PROBNP in the last 8760 hours.  CBG: No results for input(s): GLUCAP in the last 168 hours.     SignedHosie Poisson  [facility] 01/12/2015, 9:46 AM

## 2015-01-14 LAB — CULTURE, BLOOD (ROUTINE X 2)
Culture: NO GROWTH
Culture: NO GROWTH

## 2015-01-17 ENCOUNTER — Other Ambulatory Visit: Payer: Self-pay | Admitting: Oncology

## 2015-01-20 ENCOUNTER — Ambulatory Visit (HOSPITAL_BASED_OUTPATIENT_CLINIC_OR_DEPARTMENT_OTHER): Payer: 59

## 2015-01-20 ENCOUNTER — Ambulatory Visit (HOSPITAL_BASED_OUTPATIENT_CLINIC_OR_DEPARTMENT_OTHER): Payer: 59 | Admitting: Oncology

## 2015-01-20 ENCOUNTER — Other Ambulatory Visit (HOSPITAL_BASED_OUTPATIENT_CLINIC_OR_DEPARTMENT_OTHER): Payer: 59

## 2015-01-20 ENCOUNTER — Inpatient Hospital Stay (HOSPITAL_COMMUNITY)
Admission: AD | Admit: 2015-01-20 | Discharge: 2015-02-05 | DRG: 871 | Disposition: A | Discharge status: Home / Self Care | Payer: 59 | Source: Ambulatory Visit | Attending: Internal Medicine | Admitting: Internal Medicine

## 2015-01-20 ENCOUNTER — Other Ambulatory Visit: Payer: Self-pay | Admitting: *Deleted

## 2015-01-20 ENCOUNTER — Telehealth: Payer: Self-pay | Admitting: *Deleted

## 2015-01-20 ENCOUNTER — Encounter: Payer: Self-pay | Admitting: *Deleted

## 2015-01-20 ENCOUNTER — Encounter (HOSPITAL_COMMUNITY): Payer: Self-pay | Admitting: *Deleted

## 2015-01-20 ENCOUNTER — Ambulatory Visit: Payer: 59 | Admitting: Nutrition

## 2015-01-20 ENCOUNTER — Other Ambulatory Visit: Payer: Self-pay | Admitting: Nurse Practitioner

## 2015-01-20 VITALS — BP 94/59 | HR 21 | Temp 98.7°F | Resp 122 | Ht 66.0 in | Wt 129.6 lb

## 2015-01-20 VITALS — BP 113/45 | HR 130 | Temp 101.5°F | Resp 20

## 2015-01-20 DIAGNOSIS — E860 Dehydration: Secondary | ICD-10-CM | POA: Diagnosis present

## 2015-01-20 DIAGNOSIS — B961 Klebsiella pneumoniae [K. pneumoniae] as the cause of diseases classified elsewhere: Secondary | ICD-10-CM | POA: Diagnosis not present

## 2015-01-20 DIAGNOSIS — T402X5A Adverse effect of other opioids, initial encounter: Secondary | ICD-10-CM | POA: Diagnosis not present

## 2015-01-20 DIAGNOSIS — M3500 Sicca syndrome, unspecified: Secondary | ICD-10-CM | POA: Diagnosis present

## 2015-01-20 DIAGNOSIS — R531 Weakness: Secondary | ICD-10-CM

## 2015-01-20 DIAGNOSIS — Z8711 Personal history of peptic ulcer disease: Secondary | ICD-10-CM | POA: Diagnosis not present

## 2015-01-20 DIAGNOSIS — Z8249 Family history of ischemic heart disease and other diseases of the circulatory system: Secondary | ICD-10-CM | POA: Diagnosis not present

## 2015-01-20 DIAGNOSIS — C787 Secondary malignant neoplasm of liver and intrahepatic bile duct: Secondary | ICD-10-CM | POA: Diagnosis present

## 2015-01-20 DIAGNOSIS — D638 Anemia in other chronic diseases classified elsewhere: Secondary | ICD-10-CM | POA: Diagnosis present

## 2015-01-20 DIAGNOSIS — N390 Urinary tract infection, site not specified: Secondary | ICD-10-CM | POA: Diagnosis present

## 2015-01-20 DIAGNOSIS — C163 Malignant neoplasm of pyloric antrum: Secondary | ICD-10-CM | POA: Diagnosis not present

## 2015-01-20 DIAGNOSIS — C169 Malignant neoplasm of stomach, unspecified: Secondary | ICD-10-CM

## 2015-01-20 DIAGNOSIS — J45909 Unspecified asthma, uncomplicated: Secondary | ICD-10-CM | POA: Diagnosis present

## 2015-01-20 DIAGNOSIS — R112 Nausea with vomiting, unspecified: Secondary | ICD-10-CM | POA: Insufficient documentation

## 2015-01-20 DIAGNOSIS — M329 Systemic lupus erythematosus, unspecified: Secondary | ICD-10-CM | POA: Diagnosis present

## 2015-01-20 DIAGNOSIS — R7881 Bacteremia: Secondary | ICD-10-CM

## 2015-01-20 DIAGNOSIS — A4151 Sepsis due to Escherichia coli [E. coli]: Secondary | ICD-10-CM | POA: Diagnosis not present

## 2015-01-20 DIAGNOSIS — Z9689 Presence of other specified functional implants: Secondary | ICD-10-CM | POA: Diagnosis not present

## 2015-01-20 DIAGNOSIS — R1084 Generalized abdominal pain: Secondary | ICD-10-CM

## 2015-01-20 DIAGNOSIS — N132 Hydronephrosis with renal and ureteral calculous obstruction: Secondary | ICD-10-CM | POA: Diagnosis present

## 2015-01-20 DIAGNOSIS — Z1639 Resistance to other specified antimicrobial drug: Secondary | ICD-10-CM | POA: Diagnosis present

## 2015-01-20 DIAGNOSIS — Z833 Family history of diabetes mellitus: Secondary | ICD-10-CM | POA: Diagnosis not present

## 2015-01-20 DIAGNOSIS — R102 Pelvic and perineal pain: Secondary | ICD-10-CM | POA: Diagnosis not present

## 2015-01-20 DIAGNOSIS — Z903 Acquired absence of stomach [part of]: Secondary | ICD-10-CM

## 2015-01-20 DIAGNOSIS — D6481 Anemia due to antineoplastic chemotherapy: Secondary | ICD-10-CM | POA: Diagnosis present

## 2015-01-20 DIAGNOSIS — N179 Acute kidney failure, unspecified: Secondary | ICD-10-CM | POA: Diagnosis present

## 2015-01-20 DIAGNOSIS — A4150 Gram-negative sepsis, unspecified: Principal | ICD-10-CM | POA: Diagnosis present

## 2015-01-20 DIAGNOSIS — H04129 Dry eye syndrome of unspecified lacrimal gland: Secondary | ICD-10-CM

## 2015-01-20 DIAGNOSIS — R5081 Fever presenting with conditions classified elsewhere: Secondary | ICD-10-CM | POA: Diagnosis not present

## 2015-01-20 DIAGNOSIS — R109 Unspecified abdominal pain: Secondary | ICD-10-CM

## 2015-01-20 DIAGNOSIS — Z79891 Long term (current) use of opiate analgesic: Secondary | ICD-10-CM

## 2015-01-20 DIAGNOSIS — K831 Obstruction of bile duct: Secondary | ICD-10-CM | POA: Diagnosis present

## 2015-01-20 DIAGNOSIS — K5660 Unspecified intestinal obstruction: Secondary | ICD-10-CM | POA: Diagnosis not present

## 2015-01-20 DIAGNOSIS — K5903 Drug induced constipation: Secondary | ICD-10-CM | POA: Diagnosis not present

## 2015-01-20 DIAGNOSIS — K8033 Calculus of bile duct with acute cholangitis with obstruction: Secondary | ICD-10-CM | POA: Diagnosis not present

## 2015-01-20 DIAGNOSIS — Z09 Encounter for follow-up examination after completed treatment for conditions other than malignant neoplasm: Secondary | ICD-10-CM

## 2015-01-20 DIAGNOSIS — D509 Iron deficiency anemia, unspecified: Secondary | ICD-10-CM | POA: Diagnosis present

## 2015-01-20 DIAGNOSIS — Z228 Carrier of other infectious diseases: Secondary | ICD-10-CM | POA: Insufficient documentation

## 2015-01-20 DIAGNOSIS — A419 Sepsis, unspecified organism: Secondary | ICD-10-CM | POA: Diagnosis not present

## 2015-01-20 DIAGNOSIS — K219 Gastro-esophageal reflux disease without esophagitis: Secondary | ICD-10-CM | POA: Diagnosis present

## 2015-01-20 DIAGNOSIS — Z79899 Other long term (current) drug therapy: Secondary | ICD-10-CM | POA: Diagnosis not present

## 2015-01-20 DIAGNOSIS — H04123 Dry eye syndrome of bilateral lacrimal glands: Secondary | ICD-10-CM | POA: Insufficient documentation

## 2015-01-20 DIAGNOSIS — R509 Fever, unspecified: Secondary | ICD-10-CM | POA: Diagnosis present

## 2015-01-20 DIAGNOSIS — I959 Hypotension, unspecified: Secondary | ICD-10-CM | POA: Diagnosis present

## 2015-01-20 DIAGNOSIS — N1330 Unspecified hydronephrosis: Secondary | ICD-10-CM

## 2015-01-20 DIAGNOSIS — M545 Low back pain: Secondary | ICD-10-CM | POA: Diagnosis not present

## 2015-01-20 DIAGNOSIS — Z960 Presence of urogenital implants: Secondary | ICD-10-CM | POA: Diagnosis not present

## 2015-01-20 DIAGNOSIS — C786 Secondary malignant neoplasm of retroperitoneum and peritoneum: Secondary | ICD-10-CM | POA: Diagnosis present

## 2015-01-20 DIAGNOSIS — Z7952 Long term (current) use of systemic steroids: Secondary | ICD-10-CM | POA: Diagnosis not present

## 2015-01-20 DIAGNOSIS — B9620 Unspecified Escherichia coli [E. coli] as the cause of diseases classified elsewhere: Secondary | ICD-10-CM | POA: Diagnosis present

## 2015-01-20 DIAGNOSIS — B9689 Other specified bacterial agents as the cause of diseases classified elsewhere: Secondary | ICD-10-CM | POA: Diagnosis not present

## 2015-01-20 LAB — URINALYSIS, MICROSCOPIC - CHCC
Bilirubin (Urine): NEGATIVE
Blood: NEGATIVE
Glucose: NEGATIVE mg/dL
KETONES: NEGATIVE mg/dL
NITRITE: NEGATIVE
PH: 6 (ref 4.6–8.0)
Specific Gravity, Urine: 1.015 (ref 1.003–1.035)
UROBILINOGEN UR: 0.2 mg/dL (ref 0.2–1)

## 2015-01-20 LAB — COMPREHENSIVE METABOLIC PANEL (CC13)
ALBUMIN: 2.9 g/dL — AB (ref 3.5–5.0)
ALT: 18 U/L (ref 0–55)
ANION GAP: 9 meq/L (ref 3–11)
AST: 26 U/L (ref 5–34)
Alkaline Phosphatase: 143 U/L (ref 40–150)
BUN: 10.6 mg/dL (ref 7.0–26.0)
CHLORIDE: 104 meq/L (ref 98–109)
CO2: 22 meq/L (ref 22–29)
Calcium: 9.9 mg/dL (ref 8.4–10.4)
Creatinine: 1.9 mg/dL — ABNORMAL HIGH (ref 0.6–1.1)
EGFR: 37 mL/min/{1.73_m2} — AB (ref >90)
GLUCOSE: 97 mg/dL (ref 70–140)
POTASSIUM: 4.8 meq/L (ref 3.5–5.1)
SODIUM: 135 meq/L — AB (ref 136–145)
TOTAL PROTEIN: 9.2 g/dL — AB (ref 6.4–8.3)
Total Bilirubin: 0.91 mg/dL (ref 0.20–1.20)

## 2015-01-20 LAB — CBC WITH DIFFERENTIAL/PLATELET
BASO%: 0.5 % (ref 0.0–2.0)
BASOS ABS: 0 10*3/uL (ref 0.0–0.1)
EOS%: 0.3 % (ref 0.0–7.0)
Eosinophils Absolute: 0 10*3/uL (ref 0.0–0.5)
HCT: 32.2 % — ABNORMAL LOW (ref 34.8–46.6)
HEMOGLOBIN: 10.3 g/dL — AB (ref 11.6–15.9)
LYMPH#: 1.6 10*3/uL (ref 0.9–3.3)
LYMPH%: 17.9 % (ref 14.0–49.7)
MCH: 26.2 pg (ref 25.1–34.0)
MCHC: 32 g/dL (ref 31.5–36.0)
MCV: 81.8 fL (ref 79.5–101.0)
MONO#: 1.3 10*3/uL — ABNORMAL HIGH (ref 0.1–0.9)
MONO%: 14.8 % — ABNORMAL HIGH (ref 0.0–14.0)
NEUT%: 66.5 % (ref 38.4–76.8)
NEUTROS ABS: 5.9 10*3/uL (ref 1.5–6.5)
Platelets: 233 10*3/uL (ref 145–400)
RBC: 3.94 10*6/uL (ref 3.70–5.45)
RDW: 17.1 % — ABNORMAL HIGH (ref 11.2–14.5)
WBC: 8.9 10*3/uL (ref 3.9–10.3)

## 2015-01-20 LAB — LACTIC ACID, PLASMA: LACTIC ACID, VENOUS: 2.4 mmol/L — AB (ref 0.5–2.0)

## 2015-01-20 MED ORDER — PIPERACILLIN-TAZOBACTAM 3.375 G IVPB
3.3750 g | Freq: Three times a day (TID) | INTRAVENOUS | Status: DC
  Administered 2015-01-21 – 2015-01-22 (×3): 3.375 g via INTRAVENOUS
  Filled 2015-01-20 (×5): qty 50

## 2015-01-20 MED ORDER — PIPERACILLIN-TAZOBACTAM 3.375 G IVPB 30 MIN
3.3750 g | INTRAVENOUS | Status: AC
  Administered 2015-01-20: 3.375 g via INTRAVENOUS
  Filled 2015-01-20: qty 50

## 2015-01-20 MED ORDER — ACETAMINOPHEN 650 MG RE SUPP: 650.0000 mg | Freq: Four times a day (QID) | RECTAL | Status: DC | PRN

## 2015-01-20 MED ORDER — ONDANSETRON HCL 4 MG/2ML IJ SOLN
4.0000 mg | Freq: Four times a day (QID) | INTRAMUSCULAR | Status: DC | PRN
  Administered 2015-01-22 – 2015-02-05 (×16): 4 mg via INTRAVENOUS
  Filled 2015-01-20 (×20): qty 2

## 2015-01-20 MED ORDER — ACETAMINOPHEN 325 MG PO TABS
650.0000 mg | ORAL_TABLET | Freq: Once | ORAL | Status: AC
  Administered 2015-01-20: 650 mg via ORAL

## 2015-01-20 MED ORDER — LORAZEPAM 2 MG/ML IJ SOLN
INTRAMUSCULAR | Status: AC
  Filled 2015-01-20: qty 1

## 2015-01-20 MED ORDER — SODIUM CHLORIDE 0.9 % IV SOLN
INTRAVENOUS | Status: DC
  Administered 2015-01-20 – 2015-01-28 (×18): via INTRAVENOUS

## 2015-01-20 MED ORDER — LORAZEPAM 0.5 MG PO TABS: 0.5000 mg | ORAL_TABLET | Freq: Three times a day (TID) | ORAL | Status: DC | PRN

## 2015-01-20 MED ORDER — FERROUS SULFATE 325 (65 FE) MG PO TABS
325.0000 mg | ORAL_TABLET | Freq: Two times a day (BID) | ORAL | Status: DC
  Administered 2015-01-21 – 2015-02-05 (×24): 325 mg via ORAL
  Filled 2015-01-20 (×32): qty 1

## 2015-01-20 MED ORDER — LORAZEPAM 2 MG/ML IJ SOLN
0.5000 mg | Freq: Once | INTRAMUSCULAR | Status: AC
  Administered 2015-01-20: 0.5 mg via INTRAVENOUS

## 2015-01-20 MED ORDER — VANCOMYCIN HCL IN DEXTROSE 1-5 GM/200ML-% IV SOLN
1000.0000 mg | INTRAVENOUS | Status: DC
  Administered 2015-01-21: 1000 mg via INTRAVENOUS
  Filled 2015-01-20: qty 200

## 2015-01-20 MED ORDER — SODIUM CHLORIDE 0.9 % IJ SOLN
10.0000 mL | INTRAMUSCULAR | Status: DC | PRN
  Administered 2015-01-22 – 2015-02-05 (×11): 10 mL
  Filled 2015-01-20 (×11): qty 40

## 2015-01-20 MED ORDER — ENOXAPARIN SODIUM 30 MG/0.3ML ~~LOC~~ SOLN
30.0000 mg | SUBCUTANEOUS | Status: DC
  Administered 2015-01-20 – 2015-01-26 (×7): 30 mg via SUBCUTANEOUS
  Filled 2015-01-20 (×7): qty 0.3

## 2015-01-20 MED ORDER — ACETAMINOPHEN 325 MG PO TABS
650.0000 mg | ORAL_TABLET | Freq: Four times a day (QID) | ORAL | Status: DC | PRN
Start: 2015-01-20 — End: 2015-02-05
  Administered 2015-01-24 – 2015-01-27 (×2): 650 mg via ORAL
  Filled 2015-01-20 (×2): qty 2

## 2015-01-20 MED ORDER — MORPHINE SULFATE ER 15 MG PO TBCR
15.0000 mg | EXTENDED_RELEASE_TABLET | Freq: Every day | ORAL | Status: DC
  Administered 2015-01-20 – 2015-01-24 (×5): 15 mg via ORAL
  Filled 2015-01-20 (×6): qty 1

## 2015-01-20 MED ORDER — ONDANSETRON HCL 4 MG PO TABS
4.0000 mg | ORAL_TABLET | Freq: Four times a day (QID) | ORAL | Status: DC | PRN
  Administered 2015-01-21 – 2015-01-30 (×2): 4 mg via ORAL
  Filled 2015-01-20 (×2): qty 1

## 2015-01-20 MED ORDER — ACETAMINOPHEN 325 MG PO TABS
ORAL_TABLET | ORAL | Status: AC
  Filled 2015-01-20: qty 2

## 2015-01-20 MED ORDER — SODIUM CHLORIDE 0.9 % IV BOLUS (SEPSIS)
500.0000 mL | Freq: Once | INTRAVENOUS | Status: AC
  Administered 2015-01-20: 500 mL via INTRAVENOUS

## 2015-01-20 MED ORDER — SODIUM CHLORIDE 0.9 % IV SOLN
1000.0000 mL | Freq: Once | INTRAVENOUS | Status: AC
  Administered 2015-01-20: 1000 mL via INTRAVENOUS

## 2015-01-20 MED ORDER — VANCOMYCIN HCL 10 G IV SOLR
1250.0000 mg | INTRAVENOUS | Status: AC
  Administered 2015-01-20: 1250 mg via INTRAVENOUS
  Filled 2015-01-20: qty 1250

## 2015-01-20 MED ORDER — LORAZEPAM 0.5 MG PO TABS
0.5000 mg | ORAL_TABLET | Freq: Three times a day (TID) | ORAL | Status: DC | PRN
  Administered 2015-01-20 – 2015-02-02 (×4): 0.5 mg via ORAL
  Filled 2015-01-20 (×5): qty 1

## 2015-01-20 NOTE — Progress Notes (Signed)
[facility] OFFICE PROGRESS NOTE   Diagnosis: Gastric cancer  INTERVAL HISTORY:   She returns as scheduled. She was discharged on 01/11/2015 after an admission with biliary sepsis. She continues ciprofloxacin. She reports developing nausea in the evening after taking the ciprofloxacin. She is no longer on TNA. She reports streaking adequate fluids and she is eating. She feels "weak ". She has exertional dyspnea. No cough or chest pain. She has discomfort at the upper abdomen with deep inspiration.  Objective:  Vital signs in last 24 hours:  Blood pressure 94/59, pulse 21, temperature 98.7 F (37.1 C), temperature source Oral, resp. rate 122, height _0  (1.676 m), weight 129 lb 9.6 oz (58.786 kg), last menstrual period 11/23/2014, SpO2 100 %.    HEENT: No buccal thrush, mild whitecoat over the tongue Resp: Lungs clear bilaterally Cardio: Tachycardia, regular rate and rhythm GI: No hepatomegaly, nontender, right upper quadrant  biliary drain, no mass Vascular: No leg edema  Portacath/PICC-without erythema  Lab Results:  Lab Results  Component Value Date   WBC 8.9 01/20/2015   HGB 10.3* 01/20/2015   HCT 32.2* 01/20/2015   MCV 81.8 01/20/2015   PLT 233 01/20/2015   NEUTROABS 5.9 01/20/2015   BUN 10.6, creatinine 1.9, bilirubin 0.91, potassium 4.8   Lab Results  Component Value Date   CEA 2.7 11/18/2014     Medications: I have reviewed the patient's current medications.  Assessment/Plan: 1. Gastric cancer status post upper endoscopy 09/22/2013 with findings of a partially obstructing oozing cratered gastric ulcer in the gastric antrum.  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   CT scans chest/abdomen/pelvis 10/03/2013 showed a 7 x 5 mm groundglass opacity in the superior segment of the right lower lobe; a 4.5 mm nodular density in the right upper lobe; bilateral axillary adenopathy; low density area anteriorly  in the left hepatic lobe most consistent with fatty infiltration; another hypodense area within the medial segment of the left hepatic lobe; severe gastric distention; mild bilateral inguinal adenopathy.   Subtotal gastrectomy and lymph node dissection with creation of a gastrojejunostomy 10/10/2013. No evidence of distant metastatic disease noted at the time of surgery. No evidence of liver metastases. 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 Tumor involved the serosal surface. Resection margins were negative. 10 of 17 lymph nodes were positive for metastatic adenocarcinoma. Her-2 not amplified   Cycle 1 weekly 5-FU/leucovorin 11/14/2013, dose reduced beginning with week 3 secondary to mucositis and hand/foot syndrome.   Cycle 2 weekly 5-FU/leucovorin beginning 12/19/2013.  Initiation of radiation/Xeloda 01/26/2014. She decided to discontinue further radiation/Xeloda after one fraction due to significant nausea/vomiting.  Cycle 3 weekly 5-FU/leucovorin beginning 02/13/2014  Cycle 4 weekly 5-FU/leucovorin beginning 03/18/2014  Cycle 5 weekly 5-FU/leucovorin beginning 04/28/2014  CTs 06/16/2014 with new enhancing nodular lesions along the ventral peritoneal surface  CT-guided biopsy of an anterior peritoneal lesion on 06/24/2014 confirmed poorly differentiated adenocarcinoma  CTs 09/03/2014 with an increasing confluent enhancing lesion located at the ventral peritoneal surface midline of the anterior abdomen. Lesion inseparable from the posterior aspect of the abdominal wall.  CT abdomen/pelvis 11/18/2014 with significant progression of metastatic disease with enlarging metastasis within the anterior abdominal wall, enlargement of multiple peritoneal implants, interval development of masses within the pancreatic head and caudate lobe of the liver. 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 New complex solid and cystic left adnexal lesion.  Placement of percutaneous internal/external  biliary drain 11/19/2014  Cycle 1 FOLFOX 12/09/2014  Cycle 2 FOLFOX 12/23/2014  CT 01/06/2015 with a decrease in the anterior abdominal wall tumor 2. Gastric outlet obstruction secondary to #1. Resolved. 3. Nausea/vomiting secondary to #2. Resolved. 4. Abdominal pain secondary to #1. 5. History of weight loss. 6. Microcytic anemia, likely iron deficiency. She received iron dextran 10/11/2013. 7. Lupus/Sjgren's. 8. Pneumonia during hospitalization July 2015.  9. Abdominal abscesses with body fluid culture 10/20/2013 showing moderate Candida tropicalis. She was discharged home on a 4 week course of fluconazole.  10. Mucositis and hand/foot syndrome following cycle 1-week #3 5-FU/leucovorin, the 5-FU and leucovorin were dose reduced. 11. 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 12. Status post LEEP 11/07/2013. 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 Pap smear 06/08/2014-negative for intraepithelial lesions or malignancy 13. Gram-negative sepsis 01/06/2015-blood cultures positive for Klebsiella/Escherichia coli, culture from biliary drainage positive for Enterobacter 14. New right Hydronephrosis on CT 01/06/2015    Disposition:  Ms. Beale is recovering from the admission with gram-negative sepsis. She feels "weak "today, potentially secondary to dehydration. We will delay chemotherapy until she returns on 01/26/2015. She will receive intravenous fluids and I encouraged her to push liquids. She will return for a chemistry panel 01/22/2015. If the creatinine has not improved I will refer her for a renal ultrasound to follow-up on the right hydronephrosis. She is scheduled to be seen in interventional radiology next month for ongoing management of the biliary drain.  Betsy Coder, MD  01/20/2015    10:24 AM

## 2015-01-20 NOTE — Progress Notes (Signed)
1230 Pt began to vomit. Called Dr. Benay Spice, received order for ativan.  1245 Nausea/vomiting resolved. Pt began to shake with chills and requested additional blankets.  1320 After completion of 1 liter bolus of NS, pt had a temp of 100.2. Dr. Benay Spice notified, instructed to recheck vitals in 40 minutes.  1425 temp up to 101.6. Dr Benay Spice had gone home for the day. Ned Card NP notified. NP stated she will come assess pt shortly.  [place] NP at chair side.   1540 Blood cultures and urine analysis and urine culture obtained per Selena Lesser NP.  9432 Temp up to 102. Tylenol given.  1700 Per Selena Lesser NP, pt to transfer to 1428. Upon arrival to floor room changed to 1420. Report given to Catie RN.

## 2015-01-20 NOTE — Progress Notes (Signed)
CRITICAL VALUE ALERT  Critical value received:  Lactic acid 2.4  Date of notification:  01/20/2015  Time of notification:  1938  Critical value read back:Yes.    Nurse who received alert:  Janell Quiet  MD notified (1st page):  Raliegh Ip Schorr   Time of first page:  1942  MD notified (2nd page):  Time of second page:  Responding MD:  Lamar Blinks  Time MD responded:  [id number]

## 2015-01-20 NOTE — Progress Notes (Signed)
Oncology Nurse Navigator Documentation  Oncology Nurse Navigator Flowsheets 01/20/2015  Navigator Encounter Type Routine f/u  Patient Visit Type Medonc  Treatment Phase Treatment  Barriers/Navigation Needs Education  Education Diet-weight loss continues  Interventions Notified dietician she is here for appointment today  Coordination of Care -  Time Spent with Patient 5  Feeling well and says she is eating better, but weight declines. Drinking Breeze-needs to talk about where to get this.

## 2015-01-20 NOTE — H&P (Signed)
[facility] History and Physical  Andrea Best WEX:[id number] DOB: 11/18/1970 DOA: 01/20/2015  Referring physician: Jenny Reichmann. PCP: Milagros Evener, MD   Chief Complaint: fever and hypotension.   HPI: Andrea Best is a 44 y.o. female with h/o metastatic gastric cancer s/p resection , external biliary drain for obstruction on 9/29, recent admission for E COLI and klebsiella sepsis still on ciprofloxacin, presents today for fevers, hypotension, fatigue and vomiting since one week. She presented to cancer center for chemo and was found to be hypotensive, received fluid boluses, but spiked fever, and became tachycardic. Her bp paramters improved but was referred to medical service for admission for sepsis for possible UTI.     Review of Systems:  Constitutional:  Fever, weak, and fatigued.  HEENT:  No headaches, Difficulty swallowing,Tooth/dental problems,Sore throat,  No sneezing, itching, ear ache, nasal congestion, post nasal drip,  Cardio-vascular:  No chest pain, Orthopnea, PND, swelling in lower extremities, anasarca, dizziness, palpitations  GI:  Nausea and vomiting.  Resp:  No shortness of breath with exertion or at rest. No excess mucus, no productive cough, No non-productive cough, No coughing up of blood.No change in color of mucus.No wheezing.No chest wall deformity  Skin:  no rash or lesions.  GU:  no dysuria, change in color of urine, no urgency or frequency. No flank pain.  Musculoskeletal:  No joint pain or swelling. No decreased range of motion. No back pain.  Psych:  No change in mood or affect. No depression or anxiety. No memory loss.   Past Medical History  Diagnosis Date  . Eczema   . Lupus (HCC)     joint pain and extreme fatique - improved after plaquenil and prednisone therapy  . Sjogren's disease ([place])     caused pt to loose her teeth due to dry mouth;  prone to eye irritation and infection due to dry eye; affects immune system - PRONE  TO INFECTIONS   . Allergy   . Tachycardia   . Protein-calorie malnutrition, severe ([place])   . Abnormal findings on esophagogastroduodenoscopy (EGD) 09/22/13    esophagitis  . Asthma     no recent flare ups - no inhalers - as of 11/19/13  . GERD (gastroesophageal reflux disease)   . Anemia     iron deficiency   . Pneumonia     health care -associated July 2015 post op gastrectomy surgery  . Gastric cancer ([place]) 09/22/13 &10/10/13    adenocarcinoma/metastatic  FIRST CHEMO WAS 11/14/13 - NEXT CHEMO TREATMENT IS 11/21/13.   Past Surgical History  Procedure Laterality Date  . Cesarean section      1997  . Laparotomy N/A 10/10/2013    Procedure: SUBTOTAL GASTRECTOMY WITH GASTRIC JEJUNOSOTMY;  Surgeon: Edward Jolly, MD;  Location: [facility];  Service: General;  Laterality: N/A;  . Portacath placement Right 11/20/2013    Procedure: INSERTION PORT-A-CATH with ULTRA SOUND;  Surgeon: Adin Hector, MD;  Location: [facility];  Service: General;  Laterality: Right;   Social History:  reports that she has never smoked. She has never used smokeless tobacco. She reports that she does not drink alcohol or use illicit drugs.  Allergies  Allergen Reactions  . Compazine [Prochlorperazine Maleate] Other (See Comments)    Dystonia  . Azithromycin Rash  . Sulfa Antibiotics Rash    hallucinations  . Hydrocodone-Acetaminophen Nausea And Vomiting    Hallucinating   . 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 SHE DOES NOT WANT ANY BLOOD  PRODUCTS OR FRACTIONS    Family History  Problem Relation Age of Onset  . Hypertension Mother   . Diabetes Mother   . Diabetes Maternal Grandmother   . Diabetes Maternal Grandfather     Prior to Admission medications   Medication Sig Start Date End Date Taking? Authorizing Provider  Artificial Tear Ointment (DRY EYES OP) Apply 2 drops to eye daily as needed (dry eyes).   Yes Historical Provider, MD  ciprofloxacin (CIPRO) 750 MG tablet Take 1 tablet (750 mg total) by mouth  2 (two) times daily. 01/11/15  Yes Hosie Poisson, MD  dexamethasone (DECADRON) 4 MG tablet Take 2 tablets (8 mg total) by mouth 2 (two) times daily. Take 8 mg twice a day for two days beginning the day after chemotherapy. 12/23/14  Yes Owens Shark, NP  DiphenhydrAMINE HCl (ZZZQUIL) 50 MG/30ML LIQD Take 30 mLs by mouth at bedtime as needed (for sleep).   Yes Historical Provider, MD  feeding supplement (BOOST / RESOURCE BREEZE) LIQD Take 1 Container by mouth 2 (two) times daily between meals. Patient taking differently: Take 1 Container by mouth daily.  01/11/15  Yes Hosie Poisson, MD  FERROUS SULFATE PO Take 1 tablet by mouth daily.   Yes Historical Provider, MD  hydroxychloroquine (PLAQUENIL) 200 MG tablet Take 1 tablet (200 mg total) by mouth 2 (two) times daily. 01/21/15  Yes Hosie Poisson, MD  ibuprofen (ADVIL,MOTRIN) 200 MG tablet Take 200-600 mg by mouth every 6 (six) hours as needed for headache or moderate pain.   Yes Historical Provider, MD  morphine (MS CONTIN) 15 MG 12 hr tablet Take 1 tablet (15 mg total) by mouth every 12 (twelve) hours. Patient taking differently: Take 15 mg by mouth at bedtime.  12/23/14  Yes Owens Shark, NP  ondansetron (ZOFRAN) 4 MG tablet Take 1 tablet (4 mg total) by mouth every 8 (eight) hours as needed for nausea or vomiting. 12/02/14  Yes Owens Shark, NP  [place]   Yes Historical Provider, MD  LORazepam (ATIVAN) 0.5 MG tablet Take 1 tablet (0.5 mg total) by mouth every 8 (eight) hours as needed for sleep (nausea). 01/20/15   Ladell Pier, MD  TPN ADULT Inject into the vein continuous. [place]    Historical Provider, MD   Physical Exam: Filed Vitals:   01/20/15 1744  BP: 98/51  Pulse: 118  TempSrc: Oral  Resp: 18  Height: 5\' 6"  (1.676 m)  Weight: 60.056 kg (132 lb 6.4 oz)  SpO2: 100%    Wt Readings from Last 3 Encounters:  01/20/15 60.056 kg (132 lb 6.4 oz)  01/20/15 58.786 kg (129 lb 9.6 oz)    01/11/15 63.3 kg (139 lb 8.8 oz)    General:  Appears calm and comfortable Eyes: PERRL, normal lids, irises & conjunctiva Neck: no LAD, masses or thyromegaly Cardiovascular: RRR, no m/r/g. No LE edema. Telemetry: SR, no arrhythmias  Respiratory: CTA bilaterally, no w/r/r. Normal respiratory effort. Abdomen: soft, ntnd, BILIARY DRAIN. Skin: no rash or induration seen on limited exam Musculoskeletal: grossly normal tone BUE/BLE Psychiatric: grossly normal mood and affect, speech fluent and appropriate Neurologic: grossly non-focal.          Labs on Admission:  Basic Metabolic Panel:  Recent Labs Lab 01/20/15 0943  NA 135*  K 4.8  CO2 22  GLUCOSE 97  BUN 10.6  CREATININE 1.9*  CALCIUM 9.9   Liver Function Tests:  Recent Labs Lab 01/20/15  0943  AST 26  ALT 18  ALKPHOS 143  BILITOT 0.91  PROT 9.2*  ALBUMIN 2.9*   No results for input(s): LIPASE, AMYLASE in the last 168 hours. No results for input(s): AMMONIA in the last 168 hours. CBC:  Recent Labs Lab 01/20/15 0943  WBC 8.9  NEUTROABS 5.9  HGB 10.3*  HCT 32.2*  MCV 81.8  PLT 233   Cardiac Enzymes: No results for input(s): CKTOTAL, CKMB, CKMBINDEX, TROPONINI in the last 168 hours.  BNP (last 3 results)  Recent Labs  01/06/15 1450  BNP 108.2*    ProBNP (last 3 results) No results for input(s): PROBNP in the last 8760 hours.  CBG: No results for input(s): GLUCAP in the last 168 hours.  Radiological Exams on Admission: No results found.  EKG: Independently reviewed. SINUS TACHYCARDIA.   Assessment/Plan Active Problems:   Sepsis ([place])   Sepsis possibly from urinary source: Fever, hypotensive and tachycardic on arrival to cancer center, with abnormal UA.  Lactic acid still pending.  Pro calcitonin ordered.  Blood cultures and urine cultures ordered.  Because of her recent h/o of e coli and klebsiella bacteremia, we will start her on broad spectrum antibiotics with vancomycin and zosyn  and monitor.  She appears comfortable.    Anemia: Hemoglobin is 10.3, slightly higher than her usual numbers. Probably hemo concentrated.  Repeat cbc in am.  She is jehovah's witness and will not receive blood products.    Metastatic gastric cancer: Further management as per Dr Learta Codding.    Sjogren's disease: plaquenil on hold.    Asthma; no wheezing heard.   Code Status: full code  DVT Prophylaxis: Family Communication:family at bedside.  Disposition Plan: admit to tele  Time spent:  55 min  [facility] Pager [phone]

## 2015-01-20 NOTE — Telephone Encounter (Signed)
Per staff message and POF I have scheduled appts. Advised scheduler of appts and first available given. JMW  

## 2015-01-20 NOTE — Progress Notes (Signed)
ANTIBIOTIC CONSULT NOTE - INITIAL  Pharmacy Consult for Vancomycin / Zosyn Indication: Sepsis  Allergies  Allergen Reactions  . Compazine [Prochlorperazine Maleate] Other (See Comments)    Dystonia  . Azithromycin Rash  . Sulfa Antibiotics Rash    hallucinations  . Hydrocodone-Acetaminophen Nausea And Vomiting    Hallucinating   . 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 SHE DOES NOT WANT ANY BLOOD PRODUCTS OR FRACTIONS    Patient Measurements: Height: 5\' 6"  (167.6 cm) Weight: 132 lb 6.4 oz (60.056 kg) IBW/kg (Calculated) : 59.3 Adjusted Body Weight:   Vital Signs: Temp: 98.8 F (37.1 C) (10/26 1744) Temp Source: Oral (10/26 1744) BP: 98/51 mmHg (10/26 1744) Pulse Rate: 118 (10/26 1744) Intake/Output from previous day:   Intake/Output from this shift:    Labs:  Recent Labs  01/20/15 0943 01/20/15 0943  WBC 8.9  --   HGB 10.3*  --   PLT 233  --   CREATININE  --  1.9*   Estimated Creatinine Clearance: 36.1 mL/min (by C-G formula based on Cr of 1.9). No results for input(s): VANCOTROUGH, VANCOPEAK, VANCORANDOM, GENTTROUGH, GENTPEAK, GENTRANDOM, TOBRATROUGH, TOBRAPEAK, TOBRARND, AMIKACINPEAK, AMIKACINTROU, AMIKACIN in the last 72 hours.   Microbiology: Recent Results (from the past 720 hour(s))  TECHNOLOGIST REVIEW     Status: None   Collection Time: 12/23/14 10:18 AM  Result Value Ref Range Status   Technologist Review occassional meta & myelo  Final  Culture, blood (routine x 2)     Status: None   Collection Time: 01/06/15 10:42 AM  Result Value Ref Range Status   Specimen Description BLOOD LEFT ANTECUBITAL  Final   Special Requests BOTTLES DRAWN AEROBIC AND ANAEROBIC 5ML  Final   Culture  Setup Time   Final    GRAM NEGATIVE RODS IN BOTH AEROBIC AND ANAEROBIC BOTTLES CRITICAL RESULT CALLED TO, READ BACK BY AND VERIFIED WITH: [name] @0235  01/07/15 [name]    Culture   Final    KLEBSIELLA PNEUMONIAE SUSCEPTIBILITIES PERFORMED ON PREVIOUS CULTURE WITHIN THE  LAST 5 DAYS. Performed at [facility]    Report Status 01/10/2015 FINAL  Final  Urine culture     Status: None   Collection Time: 01/06/15 10:44 AM  Result Value Ref Range Status   Specimen Description URINE, CATHETERIZED  Final   Special Requests NONE  Final   Culture   Final    NO GROWTH 1 DAY Performed at [facility]    Report Status 01/07/2015 FINAL  Final  Culture, blood (routine x 2)     Status: None   Collection Time: 01/06/15 11:00 AM  Result Value Ref Range Status   Specimen Description BLOOD PORTA CATH  Final   Special Requests BOTTLES DRAWN AEROBIC AND ANAEROBIC 5CC  Final   Culture  Setup Time   Final    GRAM NEGATIVE RODS IN BOTH AEROBIC AND ANAEROBIC BOTTLES CRITICAL RESULT CALLED TO, READ BACK BY AND VERIFIED WITH: [name] RN 2129 01/06/15 [name]    Culture   Final    KLEBSIELLA PNEUMONIAE ESCHERICHIA COLI Performed at [facility]    Report Status 01/09/2015 FINAL  Final   Organism ID, Bacteria KLEBSIELLA PNEUMONIAE  Final   Organism ID, Bacteria ESCHERICHIA COLI  Final      Susceptibility   Escherichia coli - MIC*    AMPICILLIN 8 SENSITIVE Sensitive     CEFAZOLIN <=4 SENSITIVE Sensitive     CEFEPIME <=1 SENSITIVE Sensitive  CEFTAZIDIME <=1 SENSITIVE Sensitive     CEFTRIAXONE <=1 SENSITIVE Sensitive     CIPROFLOXACIN <=0.25 SENSITIVE Sensitive     GENTAMICIN <=1 SENSITIVE Sensitive     IMIPENEM <=0.25 SENSITIVE Sensitive     TRIMETH/SULFA <=20 SENSITIVE Sensitive     AMPICILLIN/SULBACTAM 4 SENSITIVE Sensitive     PIP/TAZO <=4 SENSITIVE Sensitive     * ESCHERICHIA COLI   Klebsiella pneumoniae - MIC*    AMPICILLIN >=32 RESISTANT Resistant     CEFAZOLIN <=4 SENSITIVE Sensitive     CEFEPIME <=1 SENSITIVE Sensitive     CEFTAZIDIME <=1 SENSITIVE Sensitive     CEFTRIAXONE <=1 SENSITIVE Sensitive     CIPROFLOXACIN <=0.25 SENSITIVE Sensitive     GENTAMICIN <=1 SENSITIVE Sensitive     IMIPENEM <=0.25 SENSITIVE Sensitive      TRIMETH/SULFA <=20 SENSITIVE Sensitive     AMPICILLIN/SULBACTAM 4 SENSITIVE Sensitive     PIP/TAZO <=4 SENSITIVE Sensitive     * KLEBSIELLA PNEUMONIAE  MRSA PCR Screening     Status: None   Collection Time: 01/06/15  4:38 PM  Result Value Ref Range Status   MRSA by PCR NEGATIVE NEGATIVE Final    Comment:        The GeneXpert MRSA Assay (FDA approved for NASAL specimens only), is one component of a comprehensive MRSA colonization surveillance program. It is not intended to diagnose MRSA infection nor to guide or monitor treatment for MRSA infections.   Culture, body fluid-bottle     Status: None   Collection Time: 01/06/15  7:12 PM  Result Value Ref Range Status   Specimen Description FLUID BILE  Final   Special Requests RUQ  Final   Gram Stain   Final    GRAM VARIABLE ROD IN BOTH AEROBIC AND ANAEROBIC BOTTLES CRITICAL RESULT CALLED TO, READ BACK BY AND VERIFIED WITH: BARHAM @0509  01/07/15 [name]    Culture   Final    ENTEROBACTER CLOACAE MODIFIED HODGE TEST=POSITIVE CRITICAL RESULT CALLED TO, READ BACK BY AND VERIFIED WITH: Orville Govern 01/11/15 @ 1053 M VESTAL Performed at [facility]    Report Status 01/11/2015 FINAL  Final   Organism ID, Bacteria ENTEROBACTER CLOACAE  Final      Susceptibility   Enterobacter cloacae - MIC*    CEFAZOLIN >=64 RESISTANT Resistant     CEFEPIME 2 RESISTANT Resistant     CEFTAZIDIME >=64 RESISTANT Resistant     CEFTRIAXONE >=64 RESISTANT Resistant     CIPROFLOXACIN <=0.25 SENSITIVE Sensitive     GENTAMICIN <=1 SENSITIVE Sensitive     IMIPENEM <=0.25 RESISTANT Resistant     TRIMETH/SULFA <=20 SENSITIVE Sensitive     PIP/TAZO >=128 RESISTANT Resistant     * ENTEROBACTER CLOACAE  Gram stain     Status: None   Collection Time: 01/06/15  7:12 PM  Result Value Ref Range Status   Specimen Description FLUID BILE  Final   Special Requests RUQ  Final   Gram Stain   Final    CYTOSPIN SMEAR BUDDING YEAST SEEN GRAM VARIABLE ROD GRAM  POSITIVE COCCI IN CLUSTERS CRITICAL RESULT CALLED TO, READ BACK BY AND VERIFIED WITH: [name] @0235  01/07/15 [name]    Report Status 01/07/2015 FINAL  Final  Culture, blood (routine x 2)     Status: None   Collection Time: 01/09/15 12:20 PM  Result Value Ref Range Status   Specimen Description BLOOD RIGHT ARM  Final   Special Requests IN PEDIATRIC BOTTLE 5CC  Final   Culture  Final    NO GROWTH 5 DAYS Performed at [facility]    Report Status 01/14/2015 FINAL  Final  Culture, blood (routine x 2)     Status: None   Collection Time: 01/09/15 12:34 PM  Result Value Ref Range Status   Specimen Description BLOOD RIGHT ARM  Final   Special Requests BOTTLES DRAWN AEROBIC AND ANAEROBIC 10CC  Final   Culture   Final    NO GROWTH 5 DAYS Performed at [facility]    Report Status 01/14/2015 FINAL  Final    Medical History: Past Medical History  Diagnosis Date  . Eczema   . Lupus (HCC)     joint pain and extreme fatique - improved after plaquenil and prednisone therapy  . Sjogren's disease ([place])     caused pt to loose her teeth due to dry mouth;  prone to eye irritation and infection due to dry eye; affects immune system - PRONE TO INFECTIONS   . Allergy   . Tachycardia   . Protein-calorie malnutrition, severe ([place])   . Abnormal findings on esophagogastroduodenoscopy (EGD) 09/22/13    esophagitis  . Asthma     no recent flare ups - no inhalers - as of 11/19/13  . GERD (gastroesophageal reflux disease)   . Anemia     iron deficiency   . Pneumonia     health care -associated July 2015 post op gastrectomy surgery  . Gastric cancer ([place]) 09/22/13 &10/10/13    adenocarcinoma/metastatic  FIRST CHEMO WAS 11/14/13 - NEXT CHEMO TREATMENT IS 11/21/13.   Assessment: 45 yoF with hx gastric cancer undergoing chemotherapy with recent external biliary drain placed 2/2 obstruction and recent admission for biliary sepsis now admitted from [place] with hypotension, fever, and tachycardia.   Pharmacy consulted to start broad spectrum antibiotics for concern for sepsis.    Anti-infectives 10/26 >> Vanc  >> 10/26 >>  Zosyn >>    Vitals/Labs WBC: WNL Tm24h: 102 Renal: SCr 1.9, CrCl ~36 ml/min (N44)  Cultures 10/26 bloodx2: 10/26 urine: collected   Goal of Therapy:  Vancomycin trough level 15-20 mcg/ml Eradication of infection  Plan:  Vancomycin 1250mg  IV x 1, then 1g IV q24h Zosyn 3.375g IV q8h (infuse over 4 hours) F/u cultures, renal fxn, clinical course  Ralene Bathe, PharmD, BCPS 01/20/2015, 7:16 PM  Pager: [phone] s

## 2015-01-20 NOTE — Patient Instructions (Signed)

## 2015-01-20 NOTE — Progress Notes (Signed)
Nutrition follow-up completed with patient during infusion for gastric cancer. Weight documented as 129.6 pounds, October 26 decreased from 139.5 pounds October 17. Patient attributes this to fluid shifts. TNA was discontinued and patient has increased oral intake. She is tolerating one resource breeze daily providing 250 cal and 9 g protein Patient is eating 3 meals plus one or 2 snacks a day.  Nutrition diagnosis: Inadequate oral intake continues but has improved.  Intervention: Educated patient to increase vegetarian sources of protein at every meal and snack. Provided fact sheet for patient. Recommended increased resource breeze twice a day as tolerated. Questions were answered.  Teach back method was used. Samples were provided.  Monitoring, evaluation, goals: Patient will continue to work to increase calories and protein to promote weight stabilization.  Next visit: To be scheduled as needed.  **Disclaimer: This note was dictated with voice recognition software. Similar sounding words can inadvertently be transcribed and this note may contain transcription errors which may not have been corrected upon publication of note.**

## 2015-01-21 ENCOUNTER — Inpatient Hospital Stay (HOSPITAL_COMMUNITY): Payer: 59

## 2015-01-21 DIAGNOSIS — R109 Unspecified abdominal pain: Secondary | ICD-10-CM

## 2015-01-21 DIAGNOSIS — N289 Disorder of kidney and ureter, unspecified: Secondary | ICD-10-CM

## 2015-01-21 DIAGNOSIS — C163 Malignant neoplasm of pyloric antrum: Secondary | ICD-10-CM

## 2015-01-21 DIAGNOSIS — D638 Anemia in other chronic diseases classified elsewhere: Secondary | ICD-10-CM

## 2015-01-21 DIAGNOSIS — E860 Dehydration: Secondary | ICD-10-CM

## 2015-01-21 DIAGNOSIS — D6481 Anemia due to antineoplastic chemotherapy: Secondary | ICD-10-CM

## 2015-01-21 LAB — CBC
HEMATOCRIT: 21.8 % — AB (ref 36.0–46.0)
HEMOGLOBIN: 7.1 g/dL — AB (ref 12.0–15.0)
MCH: 26.4 pg (ref 26.0–34.0)
MCHC: 32.6 g/dL (ref 30.0–36.0)
MCV: 81 fL (ref 78.0–100.0)
Platelets: 108 10*3/uL — ABNORMAL LOW (ref 150–400)
RBC: 2.69 MIL/uL — AB (ref 3.87–5.11)
RDW: 16.9 % — ABNORMAL HIGH (ref 11.5–15.5)
WBC: 6.2 10*3/uL (ref 4.0–10.5)

## 2015-01-21 LAB — COMPREHENSIVE METABOLIC PANEL
ALT: 14 U/L (ref 14–54)
ANION GAP: 5 (ref 5–15)
AST: 23 U/L (ref 15–41)
Albumin: 2.1 g/dL — ABNORMAL LOW (ref 3.5–5.0)
Alkaline Phosphatase: 87 U/L (ref 38–126)
BUN: 12 mg/dL (ref 6–20)
CHLORIDE: 110 mmol/L (ref 101–111)
CO2: 22 mmol/L (ref 22–32)
CREATININE: 1.67 mg/dL — AB (ref 0.44–1.00)
Calcium: 7.6 mg/dL — ABNORMAL LOW (ref 8.9–10.3)
GFR, EST AFRICAN AMERICAN: 43 mL/min — AB (ref >60)
GFR, EST NON AFRICAN AMERICAN: 37 mL/min — AB (ref >60)
Glucose, Bld: 92 mg/dL (ref 65–99)
POTASSIUM: 4.1 mmol/L (ref 3.5–5.1)
SODIUM: 137 mmol/L (ref 135–145)
Total Bilirubin: 1.1 mg/dL (ref 0.3–1.2)
Total Protein: 6.2 g/dL — ABNORMAL LOW (ref 6.5–8.1)

## 2015-01-21 LAB — LACTIC ACID, PLASMA
LACTIC ACID, VENOUS: 1.2 mmol/L (ref 0.5–2.0)
Lactic Acid, Venous: 1.3 mmol/L (ref 0.5–2.0)

## 2015-01-21 MED ORDER — SODIUM CHLORIDE 0.9 % IV BOLUS (SEPSIS)
1000.0000 mL | Freq: Once | INTRAVENOUS | Status: AC
  Administered 2015-01-21: 1000 mL via INTRAVENOUS

## 2015-01-21 MED ORDER — POLYVINYL ALCOHOL 1.4 % OP SOLN
1.0000 [drp] | Freq: Four times a day (QID) | OPHTHALMIC | Status: DC | PRN
  Administered 2015-01-21: 1 [drp] via OPHTHALMIC
  Filled 2015-01-21 (×3): qty 15

## 2015-01-21 NOTE — Progress Notes (Addendum)
IP PROGRESS NOTE  Subjective: States no fever overnight. No shaking chills. Mild nausea at present. No vomiting. No cough or shortness of breath. No dysuria. Reports pain at the upper abdomen, typically in the mornings, when she takes a deep breath. Complains of "dry eyes". Would like artificial tears.  Objective: Vital signs in last 24 hours: Temp:  [98.6 F (37 C)-102 F (38.9 C)] 99.1 F (37.3 C) (10/27 0646) Pulse Rate:  [21-130] 101 (10/27 0646) Resp:  [16-122] 16 (10/27 0646) BP: (92-113)/(45-59) 98/56 mmHg (10/27 0646) SpO2:  [98 %-100 %] 98 % (10/27 0646) Weight:  [129 lb 9.6 oz (58.786 kg)-132 lb 6.4 oz (60.056 kg)] 132 lb 6.4 oz (60.056 kg) (10/26 1744)  Intake/Output from previous day: 10/26 0701 - 10/27 0700 In: 873.3 [I.V.:73.3] Out: 450 [Urine:300; Drains:150] Intake/Output this shift:   Lungs clear bilaterally. Regular rate and rhythm. Abdomen soft and nontender. No hepatomegaly. Right upper quadrant drain site without erythema. No leg edema. Port-A-Cath without erythema.  Lab Results:   Recent Labs  01/20/15 0943  WBC 8.9  HGB 10.3*  HCT 32.2*  PLT 233   BMET  Recent Labs  01/20/15 0943  NA 135*  K 4.8  CO2 22  GLUCOSE 97  BUN 10.6  CREATININE 1.9*  CALCIUM 9.9   lactic acid 2.4  Studies/Results: No results found.  Medications: Reviewed  Assessment/Plan: 1. Gastric cancer status post upper endoscopy 09/22/2013 with findings of a partially obstructing oozing cratered gastric ulcer in the gastric antrum.  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   CT scans chest/abdomen/pelvis 10/03/2013 showed a 7 x 5 mm groundglass opacity in the superior segment of the right lower lobe; a 4.5 mm nodular density in the right upper lobe; bilateral axillary adenopathy; low density area anteriorly in the left hepatic lobe most consistent with fatty infiltration; another hypodense area within the medial segment of the  left hepatic lobe; severe gastric distention; mild bilateral inguinal adenopathy.   Subtotal gastrectomy and lymph node dissection with creation of a gastrojejunostomy 10/10/2013. No evidence of distant metastatic disease noted at the time of surgery. No evidence of liver metastases. 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 Tumor involved the serosal surface. Resection margins were negative. 10 of 17 lymph nodes were positive for metastatic adenocarcinoma. Her-2 not amplified   Cycle 1 weekly 5-FU/leucovorin 11/14/2013, dose reduced beginning with week 3 secondary to mucositis and hand/foot syndrome.   Cycle 2 weekly 5-FU/leucovorin beginning 12/19/2013.  Initiation of radiation/Xeloda 01/26/2014. She decided to discontinue further radiation/Xeloda after one fraction due to significant nausea/vomiting.  Cycle 3 weekly 5-FU/leucovorin beginning 02/13/2014  Cycle 4 weekly 5-FU/leucovorin beginning 03/18/2014  Cycle 5 weekly 5-FU/leucovorin beginning 04/28/2014  CTs 06/16/2014 with new enhancing nodular lesions along the ventral peritoneal surface  CT-guided biopsy of an anterior peritoneal lesion on 06/24/2014 confirmed poorly differentiated adenocarcinoma  CTs 09/03/2014 with an increasing confluent enhancing lesion located at the ventral peritoneal surface midline of the anterior abdomen. Lesion inseparable from the posterior aspect of the abdominal wall.  CT abdomen/pelvis 11/18/2014 with significant progression of metastatic disease with enlarging metastasis within the anterior abdominal wall, enlargement of multiple peritoneal implants, interval development of masses within the pancreatic head and caudate lobe of the liver. 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 New complex solid and cystic left adnexal lesion.  Placement of percutaneous internal/external biliary  drain 11/19/2014  Cycle 1 FOLFOX 12/09/2014  Cycle  2 FOLFOX 12/23/2014  CT 01/06/2015 with a decrease in the anterior abdominal wall tumor 2. Gastric outlet obstruction secondary to #1. Resolved. 3. Nausea/vomiting secondary to #2. Resolved. 4. Abdominal pain secondary to #1. 5. History of weight loss. 6. Microcytic anemia, likely iron deficiency. She received iron dextran 10/11/2013. 7. Lupus/Sjgren's. 8. Pneumonia during hospitalization July 2015.  9. Abdominal abscesses with body fluid culture 10/20/2013 showing moderate Candida tropicalis. She was discharged home on a 4 week course of fluconazole.  10. Mucositis and hand/foot syndrome following cycle 1-week #3 5-FU/leucovorin, the 5-FU and leucovorin were dose reduced. 11. 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 12. Status post LEEP 11/07/2013. 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 Pap smear 06/08/2014-negative for intraepithelial lesions or malignancy 13. Gram-negative sepsis 01/06/2015-blood cultures positive for Klebsiella/Escherichia coli, culture from biliary drainage positive for Enterobacter 14. New right hydronephrosis on CT 01/06/2015 15. Admission 01/20/2015 with fever/sepsis, question urinary source. On Zosyn and vancomycin.  Andrea Best was admitted yesterday with fever/sepsis, possible urinary source. Blood and urine cultures pending.  Plan: 1. Continue antibiotics. 2. Follow-up cultures. 3. Follow-up creatinine; if not improved recommend renal ultrasound to follow-up the right hydronephrosis identified on CT 01/06/2015.    LOS: 1 day    Ned Card, ANP/GNP-BC 01/21/2015 9:14 AM Andrea Best was interviewed and examined. She appears well today. She will likely be stable for discharge tomorrow if she remains afebrile and the cultures return negative. We should check a renal ultrasound if the creatinine remains elevated.  The  hemoglobin was higher yesterday, likely secondary to dehydration. The anemia is secondary to chronic disease, chemotherapy, renal failure, and potentially GI bleeding.

## 2015-01-21 NOTE — Progress Notes (Signed)
[facility] PROGRESS NOTE  Andrea Best IZT:[id number] DOB: 11/16/1970 DOA: 01/20/2015 PCP: Milagros Evener, MD  Assessment/Plan: Sepsis possibly from urinary source: Fever, hypotensive and tachycardic on arrival to cancer center, with abnormal UA.  Lactic acid elevated.  Repeat lactic acid is normal.   Blood cultures and urine cultures ordered and negative so far.  Because of her recent h/o of e coli and klebsiella bacteremia, we have started her on broad spectrum antibiotics with vancomycin and zosyn and monitor.  She appears comfortable.    Anemia: Hemoglobin is 10.3, slightly higher than her usual numbers. Probably hemo concentrated. repeat hemoglobin is 7.  Repeat cbc in am.  She is jehovah's witness and will not receive blood products.    Metastatic gastric cancer: Further management as per Dr Learta Codding.    Sjogren's disease: plaquenil on hold.    Asthma; no wheezing heard.   Code Status: full code. Family Communication: none at bedside.  Disposition Plan: pending.    Consultants:  oncology  Procedures: none Antibiotics:  Vancomycin   zosyn  HPI/Subjective: No complaints. No nausea or vomiting.   Objective: Filed Vitals:   01/21/15 1347  BP: 113/66  Pulse: 109  Temp: 98.7 F (37.1 C)  Resp: 18    Intake/Output Summary (Last 24 hours) at 01/21/15 1917 Last data filed at 01/21/15 1900  Gross per 24 hour  Intake 4073.33 ml  Output   2200 ml  Net 1873.33 ml   Filed Weights   01/20/15 1744  Weight: 60.056 kg (132 lb 6.4 oz)    Exam:   General:  Alert afebrile comfortable.   Cardiovascular: s1s2  Respiratory: ctab  Abdomen: soft nt ND BS+  Musculoskeletal: no pedal edema.   Data Reviewed: Basic Metabolic Panel:  Recent Labs Lab 01/20/15 0943 01/21/15 1142  NA 135* 137  K 4.8 4.1  CL  --  110  CO2 22 22  GLUCOSE 97 92  BUN 10.6 12  CREATININE 1.9* 1.67*  CALCIUM 9.9 7.6*   Liver Function  Tests:  Recent Labs Lab 01/20/15 0943 01/21/15 1142  AST 26 23  ALT 18 14  ALKPHOS 143 87  BILITOT 0.91 1.1  PROT 9.2* 6.2*  ALBUMIN 2.9* 2.1*   No results for input(s): LIPASE, AMYLASE in the last 168 hours. No results for input(s): AMMONIA in the last 168 hours. CBC:  Recent Labs Lab 01/20/15 0943 01/21/15 1142  WBC 8.9 6.2  NEUTROABS 5.9  --   HGB 10.3* 7.1*  HCT 32.2* 21.8*  MCV 81.8 81.0  PLT 233 108*   Cardiac Enzymes: No results for input(s): CKTOTAL, CKMB, CKMBINDEX, TROPONINI in the last 168 hours. BNP (last 3 results)  Recent Labs  01/06/15 1450  BNP 108.2*    ProBNP (last 3 results) No results for input(s): PROBNP in the last 8760 hours.  CBG: No results for input(s): GLUCAP in the last 168 hours.  No results found for this or any previous visit (from the past 240 hour(s)).   Studies: US Renal  01/21/2015  CLINICAL DATA:  Follow-up right hydronephrosis. History of gastric cancer. EXAM: RENAL / URINARY TRACT ULTRASOUND COMPLETE COMPARISON:  CT 01/06/2015 FINDINGS: Right Kidney: Length: 12.5 cm. There continues to be mild to moderate right hydronephrosis with dilatation of the proximal right ureter. The proximal right ureter measures up to 1.5 cm and similar to the previous CT. Left Kidney: Length: 13.2 cm. Echogenicity within normal limits. No mass or hydronephrosis visualized. Bladder: Fluid in the urinary bladder with a  normal appearance. IMPRESSION: Persistent mild to moderate right hydronephrosis. Minimal change from the previous CT. Electronically Signed   By: Markus Daft M.D.   On: 01/21/2015 16:55    Scheduled Meds: . enoxaparin (LOVENOX) injection  30 mg Subcutaneous Q24H  . ferrous sulfate  325 mg Oral BID WC  . morphine  15 mg Oral QHS  . piperacillin-tazobactam (ZOSYN)  IV  3.375 g Intravenous 3 times per day  . vancomycin  1,000 mg Intravenous Q24H   Continuous Infusions: . sodium chloride 100 mL/hr at 01/21/15 1307    Active  Problems:   Sepsis ([place])    Time spent: 30 min    [facility] Pager [phone] If 7PM-7AM, please contact night-coverage at [email or web address], password [facility] 01/21/2015, 7:17 PM  LOS: 1 day

## 2015-01-21 NOTE — Care Management Note (Signed)
Case Management Note  Patient Details  Name: Andrea Best MRN: [id number] Date of Birth: 05-27-1970  Subjective/Objective:  44 y/o f admitted w/Sepsis. Readmit-10/12-10/17-Sepsis. From home.                  Action/Plan:d/c home   Expected Discharge Date:                  Expected Discharge Plan:  Home/Self Care  In-House Referral:     Discharge planning Services  CM Consult  Post Acute Care Choice:    Choice offered to:     DME Arranged:    DME Agency:     HH Arranged:    HH Agency:     Status of Service:  In process, will continue to follow  Medicare Important Message Given:    Date Medicare IM Given:    Medicare IM give by:    Date Additional Medicare IM Given:    Additional Medicare Important Message give by:     If discussed at [place] of Stay Meetings, dates discussed:    Additional Comments:  Dessa Phi, RN 01/21/2015, 2:12 PM

## 2015-01-22 ENCOUNTER — Ambulatory Visit: Payer: 59

## 2015-01-22 ENCOUNTER — Telehealth: Payer: Self-pay | Admitting: *Deleted

## 2015-01-22 ENCOUNTER — Other Ambulatory Visit: Payer: 59

## 2015-01-22 DIAGNOSIS — R7881 Bacteremia: Secondary | ICD-10-CM | POA: Diagnosis present

## 2015-01-22 LAB — BASIC METABOLIC PANEL
Anion gap: 7 (ref 5–15)
BUN: 10 mg/dL (ref 6–20)
CHLORIDE: 112 mmol/L — AB (ref 101–111)
CO2: 21 mmol/L — AB (ref 22–32)
CREATININE: 1.65 mg/dL — AB (ref 0.44–1.00)
Calcium: 8.3 mg/dL — ABNORMAL LOW (ref 8.9–10.3)
GFR calc Af Amer: 43 mL/min — ABNORMAL LOW (ref >60)
GFR calc non Af Amer: 37 mL/min — ABNORMAL LOW (ref >60)
GLUCOSE: 96 mg/dL (ref 65–99)
Potassium: 3.9 mmol/L (ref 3.5–5.1)
SODIUM: 140 mmol/L (ref 135–145)

## 2015-01-22 LAB — URINE CULTURE: Culture: 9000

## 2015-01-22 MED ORDER — PROMETHAZINE HCL 25 MG/ML IJ SOLN
12.5000 mg | Freq: Four times a day (QID) | INTRAMUSCULAR | Status: DC | PRN
  Administered 2015-01-22 – 2015-02-04 (×25): 12.5 mg via INTRAVENOUS
  Filled 2015-01-22 (×26): qty 1

## 2015-01-22 MED ORDER — CIPROFLOXACIN IN D5W 400 MG/200ML IV SOLN
400.0000 mg | Freq: Three times a day (TID) | INTRAVENOUS | Status: DC
  Administered 2015-01-22: 400 mg via INTRAVENOUS
  Filled 2015-01-22: qty 200

## 2015-01-22 MED ORDER — DEXTROSE 5 % IV SOLN
1.2500 g | Freq: Three times a day (TID) | INTRAVENOUS | Status: AC
  Administered 2015-01-22 – 2015-01-25 (×10): 1.25 g via INTRAVENOUS
  Filled 2015-01-22 (×10): qty 6

## 2015-01-22 NOTE — Progress Notes (Signed)
IP PROGRESS NOTE  Subjective: She feels well. She is eating breakfast.  Objective: Vital signs in last 24 hours: Temp:  [98.7 F (37.1 C)-100.2 F (37.9 C)] 98.8 F (37.1 C) (10/28 0434) Pulse Rate:  [97-109] 97 (10/28 0434) Resp:  [16-18] 16 (10/28 0434) BP: (100-113)/(59-66) 100/60 mmHg (10/28 0434) SpO2:  [99 %-100 %] 99 % (10/28 0434)  Intake/Output from previous day: 10/27 0701 - 10/28 0700 In: 4550 [P.O.:50; I.V.:2300; IV Piggyback:2200] Out: 3425 [Urine:2550; Drains:875] Intake/Output this shift:   Abdomen soft and nontender. No hepatomegaly. Right upper quadrant drain site with a gauze dressing No leg edema. Port-A-Cath without erythema.  Lab Results:   Recent Labs  01/20/15 0943 01/21/15 1142  WBC 8.9 6.2  HGB 10.3* 7.1*  HCT 32.2* 21.8*  PLT 233 108*   BMET  Recent Labs  01/20/15 0943 01/21/15 1142  NA 135* 137  K 4.8 4.1  CL  --  110  CO2 22 22  GLUCOSE 97 92  BUN 10.6 12  CREATININE 1.9* 1.67*  CALCIUM 9.9 7.6*   lactic acid 2.4  Studies/Results: US Renal  01/21/2015  CLINICAL DATA:  Follow-up right hydronephrosis. History of gastric cancer. EXAM: RENAL / URINARY TRACT ULTRASOUND COMPLETE COMPARISON:  CT 01/06/2015 FINDINGS: Right Kidney: Length: 12.5 cm. There continues to be mild to moderate right hydronephrosis with dilatation of the proximal right ureter. The proximal right ureter measures up to 1.5 cm and similar to the previous CT. Left Kidney: Length: 13.2 cm. Echogenicity within normal limits. No mass or hydronephrosis visualized. Bladder: Fluid in the urinary bladder with a normal appearance. IMPRESSION: Persistent mild to moderate right hydronephrosis. Minimal change from the previous CT. Electronically Signed   By: Markus Daft M.D.   On: 01/21/2015 16:55    Medications: Reviewed  Assessment/Plan: 1. Gastric cancer status post upper endoscopy 09/22/2013 with findings of a partially obstructing oozing cratered gastric ulcer in the  gastric antrum.  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   CT scans chest/abdomen/pelvis 10/03/2013 showed a 7 x 5 mm groundglass opacity in the superior segment of the right lower lobe; a 4.5 mm nodular density in the right upper lobe; bilateral axillary adenopathy; low density area anteriorly in the left hepatic lobe most consistent with fatty infiltration; another hypodense area within the medial segment of the left hepatic lobe; severe gastric distention; mild bilateral inguinal adenopathy.   Subtotal gastrectomy and lymph node dissection with creation of a gastrojejunostomy 10/10/2013. No evidence of distant metastatic disease noted at the time of surgery. No evidence of liver metastases. 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 Tumor involved the serosal surface. Resection margins were negative. 10 of 17 lymph nodes were positive for metastatic adenocarcinoma. Her-2 not amplified   Cycle 1 weekly 5-FU/leucovorin 11/14/2013, dose reduced beginning with week 3 secondary to mucositis and hand/foot syndrome.   Cycle 2 weekly 5-FU/leucovorin beginning 12/19/2013.  Initiation of radiation/Xeloda 01/26/2014. She decided to discontinue further radiation/Xeloda after one fraction due to significant nausea/vomiting.  Cycle 3 weekly 5-FU/leucovorin beginning 02/13/2014  Cycle 4 weekly 5-FU/leucovorin beginning 03/18/2014  Cycle 5 weekly 5-FU/leucovorin beginning 04/28/2014  CTs 06/16/2014 with new enhancing nodular lesions along the ventral peritoneal surface  CT-guided biopsy of an anterior peritoneal lesion on 06/24/2014 confirmed poorly differentiated adenocarcinoma  CTs 09/03/2014 with an increasing confluent enhancing lesion located at the ventral peritoneal surface midline of the anterior abdomen. Lesion inseparable from the posterior aspect of the abdominal wall.  CT abdomen/pelvis 11/18/2014 with  significant  progression of metastatic disease with enlarging metastasis within the anterior abdominal wall, enlargement of multiple peritoneal implants, interval development of masses within the pancreatic head and caudate lobe of the liver. 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 New complex solid and cystic left adnexal lesion.  Placement of percutaneous internal/external biliary drain 11/19/2014  Cycle 1 FOLFOX 12/09/2014  Cycle 2 FOLFOX 12/23/2014  CT 01/06/2015 with a decrease in the anterior abdominal wall tumor 2. Gastric outlet obstruction secondary to #1. Resolved. 3. Nausea/vomiting secondary to #2. Resolved. 4. Abdominal pain secondary to #1. 5. History of weight loss. 6. Microcytic anemia, likely iron deficiency. She received iron dextran 10/11/2013. 7. Lupus/Sjgren's. 8. Pneumonia during hospitalization July 2015.  9. Abdominal abscesses with body fluid culture 10/20/2013 showing moderate Candida tropicalis. She was discharged home on a 4 week course of fluconazole.  10. Mucositis and hand/foot syndrome following cycle 1-week #3 5-FU/leucovorin, the 5-FU and leucovorin were dose reduced. 11. 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 12. Status post LEEP 11/07/2013. 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 Pap smear 06/08/2014-negative for intraepithelial lesions or malignancy 13. Gram-negative sepsis 01/06/2015-blood cultures positive for Klebsiella/Escherichia coli, culture from biliary drainage positive for Enterobacter 14. New right hydronephrosis on CT 01/06/2015, stable on ultrasound 01/21/2015 15. Admission 01/20/2015 with fever/sepsis, one of 2 blood cultures has returned positive for gram-negative rods  She appears well. One of the blood cultures from admission is growing gram-negative rods. The source for infection is most  likely the biliary tract. The urine is also a possible source for the bacteremia.  Recommendations: 1. Continue IV antibiotics, follow-up culture/ID of gram-negative rod, follow-up urine culture 2. Ask interventional radiology to assess the biliary drain 3. She will need to remain hospitalized for now. Please call Oncology as needed over the weekend. I will check on her 01/25/2015.    LOS: 2 days    Ninoshka Wainwright, Dominica Severin, ANP/GNP-BC 01/22/2015 10:17 AM

## 2015-01-22 NOTE — Progress Notes (Signed)
[facility] PROGRESS NOTE  Andrea Best PQZ:[id number] DOB: [birth year] DOA: 01/20/2015 PCP: Milagros Evener, MD  Assessment/Plan: Sepsis possibly from urinary source: Fever, hypotensive and tachycardic on arrival to cancer center, with abnormal UA.  Lactic acid elevated.  Repeat lactic acid is normal.  Repeat blood cultures show gram negative rods.  Discussed with Dr Linus Salmons recommended change in antibiotics  To avycaz, dosing as per pharmacy.  Blood cultures and urine cultures ordered and negative so far.  Because of her recent h/o of e coli and klebsiella bacteremia, we have started her on broad spectrum antibiotics with vancomycin and zosyn , later on discussed with ID and changed to [place].  She appears comfortable.    Anemia: Hemoglobin is 10.3, slightly higher than her usual numbers. Probably hemo concentrated. repeat hemoglobin is 7.  Repeat cbc in am.  She is jehovah's witness and will not receive blood products.    Metastatic gastric cancer: Further management as per Dr Learta Codding.    Sjogren's disease: plaquenil on hold.    Asthma; no wheezing heard.   Code Status: full code. Family Communication: none at bedside.  Disposition Plan: pending , possibly home when the repeat cultures are negative.    Consultants:  oncology  Procedures: none Antibiotics:  Vancomycin   zosyn  HPI/Subjective: No complaints. No nausea or vomiting.   Objective: Filed Vitals:   01/22/15 1628  BP: 109/59  Pulse: 97  Temp: 99 F (37.2 C)  Resp: 16    Intake/Output Summary (Last 24 hours) at 01/22/15 1801 Last data filed at 01/22/15 1743  Gross per 24 hour  Intake 3181.67 ml  Output   1675 ml  Net 1506.67 ml   Filed Weights   01/20/15 1744  Weight: 60.056 kg (132 lb 6.4 oz)    Exam:   General:  Alert afebrile comfortable.   Cardiovascular: s1s2  Respiratory: ctab  Abdomen: soft nt ND BS+  Musculoskeletal: no pedal edema.   Data  Reviewed: Basic Metabolic Panel:  Recent Labs Lab 01/20/15 0943 01/21/15 1142 01/22/15 1118  NA 135* 137 140  K 4.8 4.1 3.9  CL  --  110 112*  CO2 22 22 21*  GLUCOSE 97 92 96  BUN 10.6 12 10   CREATININE 1.9* 1.67* 1.65*  CALCIUM 9.9 7.6* 8.3*   Liver Function Tests:  Recent Labs Lab 01/20/15 0943 01/21/15 1142  AST 26 23  ALT 18 14  ALKPHOS 143 87  BILITOT 0.91 1.1  PROT 9.2* 6.2*  ALBUMIN 2.9* 2.1*   No results for input(s): LIPASE, AMYLASE in the last 168 hours. No results for input(s): AMMONIA in the last 168 hours. CBC:  Recent Labs Lab 01/20/15 0943 01/21/15 1142  WBC 8.9 6.2  NEUTROABS 5.9  --   HGB 10.3* 7.1*  HCT 32.2* 21.8*  MCV 81.8 81.0  PLT 233 108*   Cardiac Enzymes: No results for input(s): CKTOTAL, CKMB, CKMBINDEX, TROPONINI in the last 168 hours. BNP (last 3 results)  Recent Labs  01/06/15 1450  BNP 108.2*    ProBNP (last 3 results) No results for input(s): PROBNP in the last 8760 hours.  CBG: No results for input(s): GLUCAP in the last 168 hours.  Recent Results (from the past 240 hour(s))  Urine Culture     Status: None   Collection Time: 01/20/15  3:21 PM  Result Value Ref Range Status   Urine Culture, Routine Culture, Urine  Final    Comment: Final - ===== COLONY COUNT: ===== 60,000 COLONIES/ML ESCHERICHIA  COLI  ------------------------------------------------------------------------  ESCHERICHIA COLI     AMPICILLIN                       MIC      Resistant       >=32 ug/ml    AMOX/CLAVULANIC                  MIC      Indeterminate     16 ug/ml    AMPICILLIN/SUL                   MIC      Resistant       >=32 ug/ml    PIPERACILLIN/TAZO                MIC      Resistant      >=128 ug/ml    IMIPENEM                         MIC      Sensitive     <=0.25 ug/ml    CEFAZOLIN                        MIC      Resistant          8 ug/ml    CEFTRIAXONE                      MIC      Sensitive        <=1 ug/ml    CEFTAZIDIME                       MIC      Sensitive        <=1 ug/ml    CEFEPIME                         MIC      Sensitive        <=1 ug/ml    GENTAMICIN                       MIC      Sensitive        <=1 ug/ml    TOBRAMYCIN                       MIC      Sensitive        <=1 ug/ml    CIPROFLOXACIN                     MIC      Resistant        >=4 ug/ml    LEVOFLOXACIN                     MIC      Resistant        >=8 ug/ml    NITROFURANTOIN                   MIC      Sensitive       <=16 ug/ml    TRIMETH/SULFA                    MIC      Resistant      >=  320 ug/ml  ------------------------------------------------------------------------ NR=NOT REPORTABLE,SEE COMMENT ORAL therapy:A cefazolin MIC of <32 predicts  susceptibility to the oral agents cefaclor, cefdinir,cefpodoxime,cefprozil,cefuroxime, cephalexin,and loracarbef when used for therapy  of uncomplicated UTIs due to E.coli,K.pneumomiae, and P.mirabilis. PARENTERAL therapy: A cefazolin MIC of >8 indicates resistance to parenteral cefazolin. An alternate test method must be performed to confirm susceptibility to parenteral cefazolin. END OF REPORT   Urine culture     Status: None   Collection Time: 01/20/15  7:30 PM  Result Value Ref Range Status   Specimen Description URINE, RANDOM  Final   Special Requests NONE  Final   Culture   Final    9,000 COLONIES/mL INSIGNIFICANT GROWTH Performed at [facility]    Report Status 01/22/2015 FINAL  Final     Studies: US Renal  01/21/2015  CLINICAL DATA:  Follow-up right hydronephrosis. History of gastric cancer. EXAM: RENAL / URINARY TRACT ULTRASOUND COMPLETE COMPARISON:  CT 01/06/2015 FINDINGS: Right Kidney: Length: 12.5 cm. There continues to be mild to moderate right hydronephrosis with dilatation of the proximal right ureter. The proximal right ureter measures up to 1.5 cm and similar to the previous CT. Left Kidney: Length: 13.2 cm. Echogenicity within normal limits. No mass or  hydronephrosis visualized. Bladder: Fluid in the urinary bladder with a normal appearance. IMPRESSION: Persistent mild to moderate right hydronephrosis. Minimal change from the previous CT. Electronically Signed   By: Markus Daft M.D.   On: 01/21/2015 16:55    Scheduled Meds: . ceftazidime avibactam (AVYCAZ) IVPB  1.25 g Intravenous 3 times per day  . enoxaparin (LOVENOX) injection  30 mg Subcutaneous Q24H  . ferrous sulfate  325 mg Oral BID WC  . morphine  15 mg Oral QHS   Continuous Infusions: . sodium chloride 100 mL/hr at 01/22/15 1742    Active Problems:   Sepsis ([place])    Time spent: 30 min    [facility] Pager [phone] If 7PM-7AM, please contact night-coverage at [email or web address], password [facility] 01/22/2015, 6:01 PM  LOS: 2 days

## 2015-01-22 NOTE — Telephone Encounter (Signed)
Andrea Best with [employer] called reporting "one of patient's blood cultures is positive for Gram negative rods in both the aerobic and anaerobic containers.  The second set is not yet positive."  Called Dr. Benay Spice and these results provided.  No new orders received at this time.  Patient currently admitted.

## 2015-01-22 NOTE — Progress Notes (Signed)
Patient ID: Andrea Best, female   DOB: 1971/02/02, 44 y.o.   MRN: [id number]         Subjective: Patient doing well; denies significant abdominal pain, nausea, vomiting, fever or chills.   Allergies: Compazine; Azithromycin; Sulfa antibiotics; Hydrocodone-acetaminophen; and Other  Medications: Prior to Admission medications   Medication Sig Start Date End Date Taking? Authorizing Provider  Artificial Tear Ointment (DRY EYES OP) Apply 2 drops to eye daily as needed (dry eyes).   Yes Historical Provider, MD  ciprofloxacin (CIPRO) 750 MG tablet Take 1 tablet (750 mg total) by mouth 2 (two) times daily. 01/11/15  Yes Hosie Poisson, MD  dexamethasone (DECADRON) 4 MG tablet Take 2 tablets (8 mg total) by mouth 2 (two) times daily. Take 8 mg twice a day for two days beginning the day after chemotherapy. 12/23/14  Yes Owens Shark, NP  DiphenhydrAMINE HCl (ZZZQUIL) 50 MG/30ML LIQD Take 30 mLs by mouth at bedtime as needed (for sleep).   Yes Historical Provider, MD  feeding supplement (BOOST / RESOURCE BREEZE) LIQD Take 1 Container by mouth 2 (two) times daily between meals. Patient taking differently: Take 1 Container by mouth daily.  01/11/15  Yes Hosie Poisson, MD  FERROUS SULFATE PO Take 1 tablet by mouth daily.   Yes Historical Provider, MD  hydroxychloroquine (PLAQUENIL) 200 MG tablet Take 1 tablet (200 mg total) by mouth 2 (two) times daily. 01/21/15  Yes Hosie Poisson, MD  ibuprofen (ADVIL,MOTRIN) 200 MG tablet Take 200-600 mg by mouth every 6 (six) hours as needed for headache or moderate pain.   Yes Historical Provider, MD  morphine (MS CONTIN) 15 MG 12 hr tablet Take 1 tablet (15 mg total) by mouth every 12 (twelve) hours. Patient taking differently: Take 15 mg by mouth at bedtime.  12/23/14  Yes Owens Shark, NP  ondansetron (ZOFRAN) 4 MG tablet Take 1 tablet (4 mg total) by mouth every 8 (eight) hours as needed for nausea or vomiting. 12/02/14  Yes Owens Shark, NP  [place]   Yes Historical Provider, MD  LORazepam (ATIVAN) 0.5 MG tablet Take 1 tablet (0.5 mg total) by mouth every 8 (eight) hours as needed for sleep (nausea). 01/20/15   Ladell Pier, MD  TPN ADULT Inject into the vein continuous. [place]    Historical Provider, MD     Vital Signs: BP 100/60 mmHg  Pulse 97  Temp(Src) 98.8 F (37.1 C) (Oral)  Resp 16  Ht _0  (1.676 m)  Wt 132 lb 6.4 oz (60.056 kg)  BMI 21.38 kg/m2  SpO2 99%  LMP 11/23/2014 (Approximate)  Physical Exam patient awake, alert. Right upper quadrant biliary drain intact, dressing dry, site nontender, output 875 cc green bile  Imaging: US Renal  01/21/2015  CLINICAL DATA:  Follow-up right hydronephrosis. History of gastric cancer. EXAM: RENAL / URINARY TRACT ULTRASOUND COMPLETE COMPARISON:  CT 01/06/2015 FINDINGS: Right Kidney: Length: 12.5 cm. There continues to be mild to moderate right hydronephrosis with dilatation of the proximal right ureter. The proximal right ureter measures up to 1.5 cm and similar to the previous CT. Left Kidney: Length: 13.2 cm. Echogenicity within normal limits. No mass or hydronephrosis visualized. Bladder: Fluid in the urinary bladder with a normal appearance. IMPRESSION: Persistent mild to moderate right hydronephrosis. Minimal change from the previous CT. Electronically Signed   By: Markus Daft M.D.   On: 01/21/2015 16:55    Labs:  CBC:  Recent Labs  01/08/15 0430 01/10/15 0608 01/20/15 0943 01/21/15 1142  WBC 19.2* 10.3 8.9 6.2  HGB 7.7* 7.6* 10.3* 7.1*  HCT 23.3* 23.7* 32.2* 21.8*  PLT 136* 171 233 108*    COAGS:  Recent Labs  06/24/14 1142 11/19/14 0714 12/24/14 0905 01/06/15 1450  INR 1.04 1.15 1.18 1.56*  APTT 30  --   --  36    BMP:  Recent Labs  01/08/15 0430 01/09/15 1200 01/10/15 0608 01/20/15 0943 01/21/15 1142  NA 140 139 141 135* 137  K 3.2* 3.0* 3.8 4.8 4.1  CL 115* 112* 111  --  110  CO2 20* _0 GLUCOSE 102* 106* 91 97 92  BUN 9 <5* <5* 10.6 12  CALCIUM 8.0* 8.0* 8.5* 9.9 7.6*  CREATININE 0.75 0.79 0.90 1.9* 1.67*  GFRNONAA >60 >60 >60  --  37*  GFRAA >60 >60 >60  --  43*    LIVER FUNCTION TESTS:  Recent Labs  01/06/15 1035 01/07/15 0300 01/20/15 0943 01/21/15 1142  BILITOT 2.1* 0.8 0.91 1.1  AST 536* 206* 26 23  ALT 239* 143* 18 14  ALKPHOS 341* 245* 143 87  PROT 8.2* 6.7 9.2* 6.2*  ALBUMIN 2.6* 2.2* 2.9* 2.1*    Assessment and Plan: 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, s/p I/E biliary drain 11/19/14 with last exchange on 12/24/14; recent biliary sepsis; currently afebrile; WBC nl; hgb 7.1 (Jehovah's witness); latest t bili 1.1, creatinine 1.67- hydrate;  biliary drain appears to be functioning ok; for now rec cont ext drainage of biliary system; can consider biliary stent in future depending on how pt responds to chemotherapy and completes treatment for recent infections; if biliary stent placed and subsequently occludes ERCP could not be performed to manage secondary to patient's anatomy and may necessitate repeat PTC with drain placement ; urine cx pending also; keep scheduled appt for drain exchange on 02/23/15; replace electrolytes/fluids appropriately as IP/OP since biliary system has been attached back to gravity bag; flush drain with 5-10 mL sterile normal saline once daily as outpatient and 1-2 times daily as IP. Dr. Laurence Ferrari has  also evaluated patient. Patient updated on above plans.   Signed: D. Rowe Robert 01/22/2015, 11:07 AM   I spent a total of 15 minutes at the the patient's bedside AND on the patient's hospital floor or unit, greater than 50% of which was counseling/coordinating care for biliary drain

## 2015-01-23 LAB — HEPATIC FUNCTION PANEL
ALK PHOS: 80 U/L (ref 38–126)
ALT: 13 U/L — AB (ref 14–54)
AST: 39 U/L (ref 15–41)
Albumin: 2 g/dL — ABNORMAL LOW (ref 3.5–5.0)
BILIRUBIN DIRECT: 0.5 mg/dL (ref 0.1–0.5)
BILIRUBIN INDIRECT: 0.7 mg/dL (ref 0.3–0.9)
Total Bilirubin: 1.2 mg/dL (ref 0.3–1.2)
Total Protein: 6.4 g/dL — ABNORMAL LOW (ref 6.5–8.1)

## 2015-01-23 LAB — BASIC METABOLIC PANEL
Anion gap: 5 (ref 5–15)
BUN: 7 mg/dL (ref 6–20)
CHLORIDE: 112 mmol/L — AB (ref 101–111)
CO2: 22 mmol/L (ref 22–32)
Calcium: 8.2 mg/dL — ABNORMAL LOW (ref 8.9–10.3)
Creatinine, Ser: 1.57 mg/dL — ABNORMAL HIGH (ref 0.44–1.00)
GFR calc Af Amer: 46 mL/min — ABNORMAL LOW (ref >60)
GFR, EST NON AFRICAN AMERICAN: 40 mL/min — AB (ref >60)
GLUCOSE: 82 mg/dL (ref 65–99)
POTASSIUM: 4.6 mmol/L (ref 3.5–5.1)
Sodium: 139 mmol/L (ref 135–145)

## 2015-01-23 MED ORDER — MORPHINE SULFATE ER 15 MG PO TBCR
15.0000 mg | EXTENDED_RELEASE_TABLET | Freq: Once | ORAL | Status: AC
  Administered 2015-01-23: 15 mg via ORAL
  Filled 2015-01-23: qty 1

## 2015-01-23 NOTE — Progress Notes (Signed)
The medication MS Contin 15 mg tablet was administered to the patient. However, 5 minutes later the patient vomited food and liquids. The patient then requested another dose of MS Contin. The RN spoke with the pharmacy who suggested RN contact the PCP on call. RN notified PCP. Awaiting any new orders.

## 2015-01-23 NOTE — Progress Notes (Signed)
[facility] PROGRESS NOTE  Andrea Best UYQ:[id number] DOB: [birth year] DOA: 01/20/2015 PCP: Milagros Evener, MD  Assessment/Plan: Sepsis possibly from urinary source: Fever, hypotensive and tachycardic on arrival to cancer center, with abnormal UA.  Lactic acid elevated.  Repeat lactic acid is normal after fluid boluses.  Repeat blood cultures show gram negative rods awaiting identification. Urine cultures grew 60,000 e coli resistent to ciprofloxacin.  Discussed with Dr Linus Salmons recommended change in antibiotics  To avycaz, dosing as per pharmacy.  Because of her recent h/o of e coli and klebsiella bacteremia, we have started her on broad spectrum antibiotics with vancomycin and zosyn , later on discussed with ID and changed to [place].  She appears comfortable.    Anemia: Hemoglobin is 10.3, slightly higher than her usual numbers. Probably hemo concentrated. repeat hemoglobin is 7.  Repeat cbc in am.  She is jehovah's witness and will not receive blood products.    Metastatic gastric cancer: Further management as per Dr Learta Codding.    Sjogren's disease: plaquenil on hold.    Asthma; no wheezing heard.    Persistent vomiting: improved with zofran and phenergan.  Code Status: full code. Family Communication: none at bedside.  Disposition Plan: pending , possibly home when the repeat cultures are negative.    Consultants:  oncology  Procedures: none Antibiotics:  Vancomycin   zosyn  HPI/Subjective: No complaints. No nausea or vomiting today.  Objective: Filed Vitals:   01/23/15 0526  BP: 100/62  Pulse: 96  Temp: 99 F (37.2 C)  Resp: 16    Intake/Output Summary (Last 24 hours) at 01/23/15 1221 Last data filed at 01/23/15 0530  Gross per 24 hour  Intake 1231.67 ml  Output   1250 ml  Net -18.33 ml   Filed Weights   01/20/15 1744  Weight: 60.056 kg (132 lb 6.4 oz)    Exam:   General:  Alert afebrile comfortable.   Cardiovascular:  s1s2  Respiratory: ctab  Abdomen: soft nt ND BS+  Musculoskeletal: no pedal edema.   Data Reviewed: Basic Metabolic Panel:  Recent Labs Lab 01/20/15 0943 01/21/15 1142 01/22/15 1118 01/23/15 0510  NA 135* 137 140 139  K 4.8 4.1 3.9 4.6  CL  --  110 112* 112*  CO2 22 22 21* 22  GLUCOSE 97 92 96 82  BUN 10.6 12 10 7   CREATININE 1.9* 1.67* 1.65* 1.57*  CALCIUM 9.9 7.6* 8.3* 8.2*   Liver Function Tests:  Recent Labs Lab 01/20/15 0943 01/21/15 1142 01/23/15 0510  AST 26 23 39  ALT 18 14 13*  ALKPHOS 143 87 80  BILITOT 0.91 1.1 1.2  PROT 9.2* 6.2* 6.4*  ALBUMIN 2.9* 2.1* 2.0*   No results for input(s): LIPASE, AMYLASE in the last 168 hours. No results for input(s): AMMONIA in the last 168 hours. CBC:  Recent Labs Lab 01/20/15 0943 01/21/15 1142  WBC 8.9 6.2  NEUTROABS 5.9  --   HGB 10.3* 7.1*  HCT 32.2* 21.8*  MCV 81.8 81.0  PLT 233 108*   Cardiac Enzymes: No results for input(s): CKTOTAL, CKMB, CKMBINDEX, TROPONINI in the last 168 hours. BNP (last 3 results)  Recent Labs  01/06/15 1450  BNP 108.2*    ProBNP (last 3 results) No results for input(s): PROBNP in the last 8760 hours.  CBG: No results for input(s): GLUCAP in the last 168 hours.  Recent Results (from the past 240 hour(s))  Urine Culture     Status: None   Collection Time: 01/20/15  3:21 PM  Result Value Ref Range Status   Urine Culture, Routine Culture, Urine  Final    Comment: Final - ===== COLONY COUNT: ===== 60,000 COLONIES/ML ESCHERICHIA COLI  ------------------------------------------------------------------------  ESCHERICHIA COLI     AMPICILLIN                       MIC      Resistant       >=32 ug/ml    AMOX/CLAVULANIC                  MIC      Indeterminate     16 ug/ml    AMPICILLIN/SUL                   MIC      Resistant       >=32 ug/ml    PIPERACILLIN/TAZO                MIC      Resistant      >=128 ug/ml    IMIPENEM                         MIC      Sensitive      <=0.25 ug/ml    CEFAZOLIN                        MIC      Resistant          8 ug/ml    CEFTRIAXONE                      MIC      Sensitive        <=1 ug/ml    CEFTAZIDIME                      MIC      Sensitive        <=1 ug/ml    CEFEPIME                         MIC      Sensitive        <=1 ug/ml    GENTAMICIN                       MIC      Sensitive        <=1 ug/ml    TOBRAMYCIN                       MIC      Sensitive        <=1 ug/ml    CIPROFLOXACIN                     MIC      Resistant        >=4 ug/ml    LEVOFLOXACIN                     MIC      Resistant        >=8 ug/ml    NITROFURANTOIN                   MIC      Sensitive       <=16 ug/ml    TRIMETH/SULFA  MIC      Resistant      >=320 ug/ml  ------------------------------------------------------------------------ NR=NOT REPORTABLE,SEE COMMENT ORAL therapy:A cefazolin MIC of <32 predicts  susceptibility to the oral agents cefaclor, cefdinir,cefpodoxime,cefprozil,cefuroxime, cephalexin,and loracarbef when used for therapy  of uncomplicated UTIs due to E.coli,K.pneumomiae, and P.mirabilis. PARENTERAL therapy: A cefazolin MIC of >8 indicates resistance to parenteral cefazolin. An alternate test method must be performed to confirm susceptibility to parenteral cefazolin. END OF REPORT   Urine culture     Status: None   Collection Time: 01/20/15  7:30 PM  Result Value Ref Range Status   Specimen Description URINE, RANDOM  Final   Special Requests NONE  Final   Culture   Final    9,000 COLONIES/mL INSIGNIFICANT GROWTH Performed at [facility]    Report Status 01/22/2015 FINAL  Final     Studies: US Renal  01/21/2015  CLINICAL DATA:  Follow-up right hydronephrosis. History of gastric cancer. EXAM: RENAL / URINARY TRACT ULTRASOUND COMPLETE COMPARISON:  CT 01/06/2015 FINDINGS: Right Kidney: Length: 12.5 cm. There continues to be mild to moderate right hydronephrosis with dilatation of the  proximal right ureter. The proximal right ureter measures up to 1.5 cm and similar to the previous CT. Left Kidney: Length: 13.2 cm. Echogenicity within normal limits. No mass or hydronephrosis visualized. Bladder: Fluid in the urinary bladder with a normal appearance. IMPRESSION: Persistent mild to moderate right hydronephrosis. Minimal change from the previous CT. Electronically Signed   By: Markus Daft M.D.   On: 01/21/2015 16:55    Scheduled Meds: . ceftazidime avibactam (AVYCAZ) IVPB  1.25 g Intravenous 3 times per day  . enoxaparin (LOVENOX) injection  30 mg Subcutaneous Q24H  . ferrous sulfate  325 mg Oral BID WC  . morphine  15 mg Oral QHS   Continuous Infusions: . sodium chloride 100 mL/hr at 01/22/15 1742    Active Problems:   Sepsis ([place])   Bacteremia    Time spent: 30 min    [employer] [facility] Pager [phone] If 7PM-7AM, please contact night-coverage at [email or web address], password [facility] 01/23/2015, 12:21 PM  LOS: 3 days

## 2015-01-23 NOTE — Progress Notes (Signed)
Addendum: Cycle 2 of patient's FOLFOX chemotherapy held today; and patient will be treated for dehydration instead with IV fluid rehydration.  Patient developed fever to maximum 101.6 while in the infusion area.  She also had some nausea, vomiting, and chills.  Patient has recently been discharged following diagnosis of sepsis.  She continues to take Cipro antibiotics as previously directed.  Urine, urine culture, and blood cultures 2 were obtained while at the cancer center.  Due to concern for recurrent sepsis-patient will be direct admitted to the hospital for hospitalist, Dr. Karleen Hampshire.  Brief history and report were given to the hospitalist; prior to the patient being transferred to a telemetry bed feel wheelchair.  Per the cancer Center nurse.

## 2015-01-24 LAB — CBC
HCT: 24.7 % — ABNORMAL LOW (ref 36.0–46.0)
Hemoglobin: 8 g/dL — ABNORMAL LOW (ref 12.0–15.0)
MCH: 26.2 pg (ref 26.0–34.0)
MCHC: 32.4 g/dL (ref 30.0–36.0)
MCV: 81 fL (ref 78.0–100.0)
PLATELETS: 148 10*3/uL — AB (ref 150–400)
RBC: 3.05 MIL/uL — AB (ref 3.87–5.11)
RDW: 16.8 % — AB (ref 11.5–15.5)
WBC: 6.1 10*3/uL (ref 4.0–10.5)

## 2015-01-24 LAB — BASIC METABOLIC PANEL
Anion gap: 6 (ref 5–15)
BUN: 6 mg/dL (ref 6–20)
CALCIUM: 8.5 mg/dL — AB (ref 8.9–10.3)
CO2: 21 mmol/L — AB (ref 22–32)
CREATININE: 1.47 mg/dL — AB (ref 0.44–1.00)
Chloride: 111 mmol/L (ref 101–111)
GFR calc non Af Amer: 43 mL/min — ABNORMAL LOW (ref >60)
GFR, EST AFRICAN AMERICAN: 50 mL/min — AB (ref >60)
Glucose, Bld: 96 mg/dL (ref 65–99)
Potassium: 3.6 mmol/L (ref 3.5–5.1)
Sodium: 138 mmol/L (ref 135–145)

## 2015-01-24 LAB — CULTURE, BLOOD (SINGLE)

## 2015-01-24 NOTE — Progress Notes (Signed)
[facility] PROGRESS NOTE  GLENN CHRISTO PIR:[id number] DOB: 01-13-1971 DOA: 01/20/2015 PCP: Milagros Evener, MD  Assessment/Plan: Sepsis possibly from urinary source: Fever, hypotensive and tachycardic on arrival to cancer center, with abnormal UA.  Lactic acid elevated.  Repeat lactic acid is normal after fluid boluses.  Repeat blood cultures show klebsiella. Urine cultures grew 60,000 e coli resistent to ciprofloxacin.  Discussed with Dr Linus Salmons recommended change in antibiotics  To avycaz, dosing as per pharmacy.  Because of her recent h/o of e coli and klebsiella bacteremia, we have started her on broad spectrum antibiotics with vancomycin and zosyn , later on discussed with ID and changed to [place].  She appears comfortable.    Anemia: Hemoglobin is 10.3, slightly higher than her usual numbers. Probably hemo concentrated. repeat hemoglobin is 7.  Repeat cbc in am.  She is jehovah's witness and will not receive blood products.    Metastatic gastric cancer: Further management as per Dr Learta Codding.    Sjogren's disease: plaquenil on hold.   Acute renal failure: possibly from the hypotension and decreased renal perfusion. Improving with IV fluids.     Asthma; no wheezing heard.    Persistent vomiting: improved with zofran and phenergan.   Code Status: full code. Family Communication: none at bedside.  Disposition Plan: pending , possibly home when the repeat cultures are negative.    Consultants:  oncology  Procedures: none Antibiotics:  Vancomycin   zosyn  HPI/Subjective: Vomiting last night.  Recommend to ambulate.  Objective: Filed Vitals:   01/24/15 1420  BP: 119/75  Pulse: 99  Temp: 98.9 F (37.2 C)  Resp: 18    Intake/Output Summary (Last 24 hours) at 01/24/15 1609 Last data filed at 01/24/15 1300  Gross per 24 hour  Intake 2206.67 ml  Output   1775 ml  Net 431.67 ml   Filed Weights   01/20/15 1744  Weight: 60.056 kg  (132 lb 6.4 oz)    Exam:   General:  Alert afebrile comfortable.   Cardiovascular: s1s2  Respiratory: ctab  Abdomen: soft nt ND BS+  Musculoskeletal: no pedal edema.   Data Reviewed: Basic Metabolic Panel:  Recent Labs Lab 01/20/15 0943 01/21/15 1142 01/22/15 1118 01/23/15 0510 01/24/15 0401  NA 135* 137 140 139 138  K 4.8 4.1 3.9 4.6 3.6  CL  --  110 112* 112* 111  CO2 22 22 21* 22 21*  GLUCOSE 97 92 96 82 96  BUN 10.6 12 10 7 6   CREATININE 1.9* 1.67* 1.65* 1.57* 1.47*  CALCIUM 9.9 7.6* 8.3* 8.2* 8.5*   Liver Function Tests:  Recent Labs Lab 01/20/15 0943 01/21/15 1142 01/23/15 0510  AST 26 23 39  ALT 18 14 13*  ALKPHOS 143 87 80  BILITOT 0.91 1.1 1.2  PROT 9.2* 6.2* 6.4*  ALBUMIN 2.9* 2.1* 2.0*   No results for input(s): LIPASE, AMYLASE in the last 168 hours. No results for input(s): AMMONIA in the last 168 hours. CBC:  Recent Labs Lab 01/20/15 0943 01/21/15 1142 01/24/15 0401  WBC 8.9 6.2 6.1  NEUTROABS 5.9  --   --   HGB 10.3* 7.1* 8.0*  HCT 32.2* 21.8* 24.7*  MCV 81.8 81.0 81.0  PLT 233 108* 148*   Cardiac Enzymes: No results for input(s): CKTOTAL, CKMB, CKMBINDEX, TROPONINI in the last 168 hours. BNP (last 3 results)  Recent Labs  01/06/15 1450  BNP 108.2*    ProBNP (last 3 results) No results for input(s): PROBNP in the last 8760 hours.  CBG: No results for input(s): GLUCAP in the last 168 hours.  Recent Results (from the past 240 hour(s))  Urine Culture     Status: None   Collection Time: 01/20/15  3:21 PM  Result Value Ref Range Status   Urine Culture, Routine Culture, Urine  Final    Comment: Final - ===== COLONY COUNT: ===== 60,000 COLONIES/ML ESCHERICHIA COLI  ------------------------------------------------------------------------  ESCHERICHIA COLI     AMPICILLIN                       MIC      Resistant       >=32 ug/ml    AMOX/CLAVULANIC                  MIC      Indeterminate     16 ug/ml    AMPICILLIN/SUL                    MIC      Resistant       >=32 ug/ml    PIPERACILLIN/TAZO                MIC      Resistant      >=128 ug/ml    IMIPENEM                         MIC      Sensitive     <=0.25 ug/ml    CEFAZOLIN                        MIC      Resistant          8 ug/ml    CEFTRIAXONE                      MIC      Sensitive        <=1 ug/ml    CEFTAZIDIME                      MIC      Sensitive        <=1 ug/ml    CEFEPIME                         MIC      Sensitive        <=1 ug/ml    GENTAMICIN                       MIC      Sensitive        <=1 ug/ml    TOBRAMYCIN                       MIC      Sensitive        <=1 ug/ml    CIPROFLOXACIN                     MIC      Resistant        >=4 ug/ml    LEVOFLOXACIN                     MIC      Resistant        >=8 ug/ml    NITROFURANTOIN  MIC      Sensitive       <=16 ug/ml    TRIMETH/SULFA                    MIC      Resistant      >=320 ug/ml  ------------------------------------------------------------------------ NR=NOT REPORTABLE,SEE COMMENT ORAL therapy:A cefazolin MIC of <32 predicts  susceptibility to the oral agents cefaclor, cefdinir,cefpodoxime,cefprozil,cefuroxime, cephalexin,and loracarbef when used for therapy  of uncomplicated UTIs due to E.coli,K.pneumomiae, and P.mirabilis. PARENTERAL therapy: A cefazolin MIC of >8 indicates resistance to parenteral cefazolin. An alternate test method must be performed to confirm susceptibility to parenteral cefazolin. END OF REPORT   Blood culture (routine single)     Status: None   Collection Time: 01/20/15  3:22 PM  Result Value Ref Range Status   Blood Culture, Routine Culture, Blood  Final    Comment: ------------------------------------------------------------------------Gram Stain Report Called to,Read Back Byand Verified With:ROSALYND W @ 9:39 AM10/28/16 BY DWEEKSFinal - ===== FINAL REPORT =====KLEBSIELLA   PNEUMONIAE------------------------------------------------------------------------ KLEBSIELLA PNEUMONIAE   AMPICILLIN                       MIC      Resistant       >=32 ug/ml   AMOX/CLAVULANIC                  MIC      Sensitive        <=2 ug/ml    AMPICILLIN/SUL                   MIC      Sensitive          4 ug/ml   PIPERACILLIN/TAZO                MIC      Sensitive          8 ug/ml   IMIPENEM                         MIC      Sensitive     <=0.25 ug/ml   CEFAZOLIN                        MIC       NR               <=4 ug/ml   CEFTRIAXONE                      MIC      Sensitive        <=1 ug/ml   CEFTAZIDIME                      MIC      Sensitive        <=1 ug/ml   CEFEPIME                         MIC      Sensitive        <=1 ug/ml   GENTAMICIN                        MI C      Sensitive        <=1 ug/ml   TOBRAMYCIN  MIC      Sensitive        <=1 ug/ml   CIPROFLOXACIN                    MIC      Indeterminate        ug/ml   LEVOFLOXACIN                     MIC       Indeterminate      4 ug/ml   TRIMETH/SULFA                    MIC      Sensitive         40 ug/ml------------------------------------------------------------------------NR=NOT REPORTABLE,SEE COMMENTORAL therapy:A cefazolin MIC of <32 predicts  susceptibility to the oral agents cefaclor,cefdinir,cefpodoxime,cefprozil,cefuroxime,cephalexin,and loracarbef when used for therapy of uncomplicated UTIs due to E.coli,K.pneumomiae,and P.mirabilis. PARENTERAL therapy: A cefazolinMIC of >8 indicates  resistance to parenteralcefazolin. An alternate test method must beperformed to confirm susceptibility to parenteralcefazolin.END OF REPORT   Urine culture     Status: None   Collection Time: 01/20/15  7:30 PM  Result Value Ref Range Status   Specimen Description URINE, RANDOM  Final   Special Requests NONE  Final   Culture   Final    9,000 COLONIES/mL INSIGNIFICANT GROWTH Performed at [facility]    Report Status  01/22/2015 FINAL  Final     Studies: No results found.  Scheduled Meds: . ceftazidime avibactam (AVYCAZ) IVPB  1.25 g Intravenous 3 times per day  . enoxaparin (LOVENOX) injection  30 mg Subcutaneous Q24H  . ferrous sulfate  325 mg Oral BID WC  . morphine  15 mg Oral QHS   Continuous Infusions: . sodium chloride 100 mL/hr at 01/24/15 0936    Active Problems:   Sepsis ([place])   Bacteremia    Time spent: 15 min    [place] [facility] Pager [phone] If 7PM-7AM, please contact night-coverage at [email or web address], password [facility] 01/24/2015, 4:09 PM  LOS: 4 days

## 2015-01-25 DIAGNOSIS — K5660 Unspecified intestinal obstruction: Secondary | ICD-10-CM

## 2015-01-25 DIAGNOSIS — R7881 Bacteremia: Secondary | ICD-10-CM

## 2015-01-25 DIAGNOSIS — R112 Nausea with vomiting, unspecified: Secondary | ICD-10-CM | POA: Insufficient documentation

## 2015-01-25 LAB — BASIC METABOLIC PANEL
ANION GAP: 3 — AB (ref 5–15)
BUN: <5 mg/dL — ABNORMAL LOW (ref 6–20)
CO2: 23 mmol/L (ref 22–32)
Calcium: 8.2 mg/dL — ABNORMAL LOW (ref 8.9–10.3)
Chloride: 111 mmol/L (ref 101–111)
Creatinine, Ser: 1.2 mg/dL — ABNORMAL HIGH (ref 0.44–1.00)
GFR calc non Af Amer: 55 mL/min — ABNORMAL LOW (ref >60)
GLUCOSE: 88 mg/dL (ref 65–99)
POTASSIUM: 3.6 mmol/L (ref 3.5–5.1)
Sodium: 137 mmol/L (ref 135–145)

## 2015-01-25 MED ORDER — DEXTROSE 5 % IV SOLN
2.5000 g | Freq: Three times a day (TID) | INTRAVENOUS | Status: DC
  Administered 2015-01-25 – 2015-01-27 (×6): 2.5 g via INTRAVENOUS
  Filled 2015-01-25 (×8): qty 12

## 2015-01-25 MED ORDER — MORPHINE SULFATE (PF) 2 MG/ML IV SOLN
2.0000 mg | INTRAVENOUS | Status: AC | PRN
  Administered 2015-01-25 – 2015-01-26 (×2): 2 mg via INTRAVENOUS
  Filled 2015-01-25 (×2): qty 1

## 2015-01-25 MED ORDER — MORPHINE SULFATE ER 15 MG PO TBCR
15.0000 mg | EXTENDED_RELEASE_TABLET | Freq: Two times a day (BID) | ORAL | Status: DC
  Administered 2015-01-25 – 2015-02-05 (×20): 15 mg via ORAL
  Filled 2015-01-25 (×20): qty 1

## 2015-01-25 NOTE — Progress Notes (Signed)
Pharmacy - Brief Note (Avycaz dosing)  Renal function has improved, CrCl = 36ml/min  Plan: Day #4   Now that CrCl > 54ml/min, change ceftazadime/avibactam to 2.5gm IV q8h  Doreene Eland, PharmD, BCPS.   Pager: [phone] 01/25/2015 12:29 PM

## 2015-01-25 NOTE — Progress Notes (Signed)
[facility] PROGRESS NOTE  Andrea Best PTW:[id number] DOB: 08/09/1970 DOA: 01/20/2015 PCP: Milagros Evener, MD  Assessment/Plan: Sepsis possibly from urinary source: Fever, hypotensive and tachycardic on arrival to cancer center, with abnormal UA.  Lactic acid elevated.  Repeat lactic acid is normal after fluid boluses.  Repeat blood cultures show klebsiella. Urine cultures grew 60,000 e coli resistent to ciprofloxacin.  Discussed with Dr Linus Salmons recommended change in antibiotics  To avycaz, dosing as per pharmacy.  Because of her recent h/o of e coli and klebsiella bacteremia, we have started her on broad spectrum antibiotics with vancomycin and zosyn , later on discussed with ID and changed to [place].  She appears comfortable.    Anemia: Hemoglobin is 10.3, slightly higher than her usual numbers. Probably hemo concentrated. repeat hemoglobin is 7.  Repeat cbc in am.  She is jehovah's witness and will not receive blood products.    Metastatic gastric cancer: Further management as per Dr Learta Codding.    Sjogren's disease: plaquenil on hold.   Acute renal failure: possibly from the hypotension and decreased renal perfusion. Improving with IV fluids.     Asthma; no wheezing heard.    Persistent vomiting: improved with zofran and phenergan.  Upper GI study in am.   Code Status: full code. Family Communication: none at bedside.  Disposition Plan: pending , possibly home when the repeat cultures are negative.    Consultants:  oncology  Procedures: none Antibiotics:  avycaz 10/28    HPI/Subjective: Vomiting last night.  Recommend to ambulate.  Increased the pain meds.  Objective: Filed Vitals:   01/25/15 1440  BP: 132/81  Pulse: 100  Temp: 98.6 F (37 C)  Resp: 20    Intake/Output Summary (Last 24 hours) at 01/25/15 1645 Last data filed at 01/25/15 1447  Gross per 24 hour  Intake   2395 ml  Output    275 ml  Net   2120 ml   Filed  Weights   01/20/15 1744  Weight: 60.056 kg (132 lb 6.4 oz)    Exam:   General:  Alert afebrile comfortable.   Cardiovascular: s1s2  Respiratory: ctab  Abdomen: soft nt ND BS+  Musculoskeletal: no pedal edema.   Data Reviewed: Basic Metabolic Panel:  Recent Labs Lab 01/21/15 1142 01/22/15 1118 01/23/15 0510 01/24/15 0401 01/25/15 0333  NA 137 140 139 138 137  K 4.1 3.9 4.6 3.6 3.6  CL 110 112* 112* 111 111  CO2 22 21* 22 21* 23  GLUCOSE 92 96 82 96 88  BUN 12 10 7 6  <5*  CREATININE 1.67* 1.65* 1.57* 1.47* 1.20*  CALCIUM 7.6* 8.3* 8.2* 8.5* 8.2*   Liver Function Tests:  Recent Labs Lab 01/20/15 0943 01/21/15 1142 01/23/15 0510  AST 26 23 39  ALT 18 14 13*  ALKPHOS 143 87 80  BILITOT 0.91 1.1 1.2  PROT 9.2* 6.2* 6.4*  ALBUMIN 2.9* 2.1* 2.0*   No results for input(s): LIPASE, AMYLASE in the last 168 hours. No results for input(s): AMMONIA in the last 168 hours. CBC:  Recent Labs Lab 01/20/15 0943 01/21/15 1142 01/24/15 0401  WBC 8.9 6.2 6.1  NEUTROABS 5.9  --   --   HGB 10.3* 7.1* 8.0*  HCT 32.2* 21.8* 24.7*  MCV 81.8 81.0 81.0  PLT 233 108* 148*   Cardiac Enzymes: No results for input(s): CKTOTAL, CKMB, CKMBINDEX, TROPONINI in the last 168 hours. BNP (last 3 results)  Recent Labs  01/06/15 1450  BNP 108.2*  ProBNP (last 3 results) No results for input(s): PROBNP in the last 8760 hours.  CBG: No results for input(s): GLUCAP in the last 168 hours.  Recent Results (from the past 240 hour(s))  Urine Culture     Status: None   Collection Time: 01/20/15  3:21 PM  Result Value Ref Range Status   Urine Culture, Routine Culture, Urine  Final    Comment: Final - ===== COLONY COUNT: ===== 60,000 COLONIES/ML ESCHERICHIA COLI  ------------------------------------------------------------------------  ESCHERICHIA COLI     AMPICILLIN                       MIC      Resistant       >=32 ug/ml    AMOX/CLAVULANIC                  MIC       Indeterminate     16 ug/ml    AMPICILLIN/SUL                   MIC      Resistant       >=32 ug/ml    PIPERACILLIN/TAZO                MIC      Resistant      >=128 ug/ml    IMIPENEM                         MIC      Sensitive     <=0.25 ug/ml    CEFAZOLIN                        MIC      Resistant          8 ug/ml    CEFTRIAXONE                      MIC      Sensitive        <=1 ug/ml    CEFTAZIDIME                      MIC      Sensitive        <=1 ug/ml    CEFEPIME                         MIC      Sensitive        <=1 ug/ml    GENTAMICIN                       MIC      Sensitive        <=1 ug/ml    TOBRAMYCIN                       MIC      Sensitive        <=1 ug/ml    CIPROFLOXACIN                     MIC      Resistant        >=4 ug/ml    LEVOFLOXACIN                     MIC      Resistant        >=  8 ug/ml    NITROFURANTOIN                   MIC      Sensitive       <=16 ug/ml    TRIMETH/SULFA                    MIC      Resistant      >=320 ug/ml  ------------------------------------------------------------------------ NR=NOT REPORTABLE,SEE COMMENT ORAL therapy:A cefazolin MIC of <32 predicts  susceptibility to the oral agents cefaclor, cefdinir,cefpodoxime,cefprozil,cefuroxime, cephalexin,and loracarbef when used for therapy  of uncomplicated UTIs due to E.coli,K.pneumomiae, and P.mirabilis. PARENTERAL therapy: A cefazolin MIC of >8 indicates resistance to parenteral cefazolin. An alternate test method must be performed to confirm susceptibility to parenteral cefazolin. END OF REPORT   Blood culture (routine single)     Status: None   Collection Time: 01/20/15  3:22 PM  Result Value Ref Range Status   Blood Culture, Routine Culture, Blood  Final    Comment: ------------------------------------------------------------------------Gram Stain Report Called to,Read Back Byand Verified With:ROSALYND W @ 9:39 AM10/28/16 BY [email or web address] - ===== FINAL REPORT =====KLEBSIELLA   PNEUMONIAE------------------------------------------------------------------------ KLEBSIELLA PNEUMONIAE   AMPICILLIN                       MIC      Resistant       >=32 ug/ml   AMOX/CLAVULANIC                  MIC      Sensitive        <=2 ug/ml    AMPICILLIN/SUL                   MIC      Sensitive          4 ug/ml   PIPERACILLIN/TAZO                MIC      Sensitive          8 ug/ml   IMIPENEM                         MIC      Sensitive     <=0.25 ug/ml   CEFAZOLIN                        MIC       NR               <=4 ug/ml   CEFTRIAXONE                      MIC      Sensitive        <=1 ug/ml   CEFTAZIDIME                      MIC      Sensitive        <=1 ug/ml   CEFEPIME                         MIC      Sensitive        <=1 ug/ml   GENTAMICIN                        MI C  Sensitive        <=1 ug/ml   TOBRAMYCIN                       MIC      Sensitive        <=1 ug/ml   CIPROFLOXACIN                    MIC      Indeterminate        ug/ml   LEVOFLOXACIN                     MIC       Indeterminate      4 ug/ml   TRIMETH/SULFA                    MIC      Sensitive         40 ug/ml------------------------------------------------------------------------NR=NOT REPORTABLE,SEE COMMENTORAL therapy:A cefazolin MIC of <32 predicts  susceptibility to the oral agents cefaclor,cefdinir,cefpodoxime,cefprozil,cefuroxime,cephalexin,and loracarbef when used for therapy of uncomplicated UTIs due to E.coli,K.pneumomiae,and P.mirabilis. PARENTERAL therapy: A cefazolinMIC of >8 indicates  resistance to parenteralcefazolin. An alternate test method must beperformed to confirm susceptibility to parenteralcefazolin.END OF REPORT   Urine culture     Status: None   Collection Time: 01/20/15  7:30 PM  Result Value Ref Range Status   Specimen Description URINE, RANDOM  Final   Special Requests NONE  Final   Culture   Final    9,000 COLONIES/mL INSIGNIFICANT GROWTH Performed at [facility]    Report Status  01/22/2015 FINAL  Final     Studies: No results found.  Scheduled Meds: . ceftazidime avibactam (AVYCAZ) IVPB  1.25 g Intravenous 3 times per day  . ceftazidime avibactam (AVYCAZ) IVPB  2.5 g Intravenous 3 times per day  . enoxaparin (LOVENOX) injection  30 mg Subcutaneous Q24H  . ferrous sulfate  325 mg Oral BID WC  . morphine  15 mg Oral Q12H   Continuous Infusions: . sodium chloride 100 mL/hr at 01/25/15 1620    Active Problems:   Sepsis ([place])   Bacteremia    Time spent: 15 min    [facility] Pager [phone] If 7PM-7AM, please contact night-coverage at [email or web address], password [facility] 01/25/2015, 4:45 PM  LOS: 5 days

## 2015-01-25 NOTE — Progress Notes (Signed)
IP PROGRESS NOTE  Subjective: She reports nausea and vomiting in the evenings. Her bowels are moving. She is eating.  Objective: Vital signs in last 24 hours: Temp:  [98.3 F (36.8 C)-98.9 F (37.2 C)] 98.3 F (36.8 C) (10/30 2124) Pulse Rate:  [88-99] 88 (10/30 2124) Resp:  [18] 18 (10/30 2124) BP: (115-119)/(70-75) 115/70 mmHg (10/30 2124) SpO2:  [100 %] 100 % (10/30 2124)  Intake/Output from previous day: 10/30 0701 - 10/31 0700 In: 3265 [P.O.:720; I.V.:2395; IV Piggyback:150] Out: 525 [Urine:250; Drains:275] Intake/Output this shift:   Abdomen soft and nontender. No hepatomegaly. Right upper quadrant drain site with a gauze dressing No leg edema. Port-A-Cath without erythema.  Lab Results:   Recent Labs  01/24/15 0401  WBC 6.1  HGB 8.0*  HCT 24.7*  PLT 148*   BMET  Recent Labs  01/24/15 0401 01/25/15 0333  NA 138 137  K 3.6 3.6  CL 111 111  CO2 21* 23  GLUCOSE 96 88  BUN 6 <5*  CREATININE 1.47* 1.20*  CALCIUM 8.5* 8.2*   lactic acid 2.4  Studies/Results: No results found.  Medications: Reviewed  Assessment/Plan: 1. Gastric cancer status post upper endoscopy 09/22/2013 with findings of a partially obstructing oozing cratered gastric ulcer in the gastric antrum.  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   CT scans chest/abdomen/pelvis 10/03/2013 showed a 7 x 5 mm groundglass opacity in the superior segment of the right lower lobe; a 4.5 mm nodular density in the right upper lobe; bilateral axillary adenopathy; low density area anteriorly in the left hepatic lobe most consistent with fatty infiltration; another hypodense area within the medial segment of the left hepatic lobe; severe gastric distention; mild bilateral inguinal adenopathy.   Subtotal gastrectomy and lymph node dissection with creation of a gastrojejunostomy 10/10/2013. No evidence of distant metastatic disease noted at the time of surgery. No  evidence of liver metastases. 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 Tumor involved the serosal surface. Resection margins were negative. 10 of 17 lymph nodes were positive for metastatic adenocarcinoma. Her-2 not amplified   Cycle 1 weekly 5-FU/leucovorin 11/14/2013, dose reduced beginning with week 3 secondary to mucositis and hand/foot syndrome.   Cycle 2 weekly 5-FU/leucovorin beginning 12/19/2013.  Initiation of radiation/Xeloda 01/26/2014. She decided to discontinue further radiation/Xeloda after one fraction due to significant nausea/vomiting.  Cycle 3 weekly 5-FU/leucovorin beginning 02/13/2014  Cycle 4 weekly 5-FU/leucovorin beginning 03/18/2014  Cycle 5 weekly 5-FU/leucovorin beginning 04/28/2014  CTs 06/16/2014 with new enhancing nodular lesions along the ventral peritoneal surface  CT-guided biopsy of an anterior peritoneal lesion on 06/24/2014 confirmed poorly differentiated adenocarcinoma  CTs 09/03/2014 with an increasing confluent enhancing lesion located at the ventral peritoneal surface midline of the anterior abdomen. Lesion inseparable from the posterior aspect of the abdominal wall.  CT abdomen/pelvis 11/18/2014 with significant progression of metastatic disease with enlarging metastasis within the anterior abdominal wall, enlargement of multiple peritoneal implants, interval development of masses within the pancreatic head and caudate lobe of the liver. 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 New complex solid and cystic left adnexal lesion.  Placement of percutaneous internal/external biliary drain 11/19/2014  Cycle 1 FOLFOX 12/09/2014  Cycle 2 FOLFOX 12/23/2014  CT 01/06/2015 with a decrease in the anterior abdominal wall tumor 2. Gastric outlet obstruction secondary to #1. Resolved. 3. Nausea/vomiting secondary to #2.  Resolved. 4. Abdominal pain secondary to #1. 5. History of weight loss. 6.  Microcytic anemia, likely iron deficiency. She received iron dextran 10/11/2013. 7. Lupus/Sjgren's. 8. Pneumonia during hospitalization July 2015.  9. Abdominal abscesses with body fluid culture 10/20/2013 showing moderate Candida tropicalis. She was discharged home on a 4 week course of fluconazole.  10. Mucositis and hand/foot syndrome following cycle 1-week #3 5-FU/leucovorin, the 5-FU and leucovorin were dose reduced. 11. 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 12. Status post LEEP 11/07/2013. 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 Pap smear 06/08/2014-negative for intraepithelial lesions or malignancy 13. Gram-negative sepsis 01/06/2015-blood cultures positive for Klebsiella/Escherichia coli, culture from biliary drainage positive for Enterobacter 14. New right hydronephrosis on CT 01/06/2015, stable on ultrasound 01/21/2015 15. Admission 01/20/2015 with fever/sepsis, one blood culture positive for Klebsiella pneumoniae, urine culture positive for a resistant Escherichia coli  She is maintained on IV antibiotics for treatment of gram-negative sepsis. The source for the bacteremia is most likely the biliary tract. She has developed intermittent nausea and vomiting. This could be related to gastric outlet obstruction. Recommendations: 1. Continue antibiotics as recommended by infectious disease 2. Upper GI study to look for evidence of a partial bowel obstruction 3. Chemotherapy scheduled for 01/26/2015 will be placed on hold    LOS: 5 days    [place], ANP/GNP-BC 01/25/2015 2:18 PM

## 2015-01-26 ENCOUNTER — Ambulatory Visit: Payer: 59 | Admitting: Oncology

## 2015-01-26 ENCOUNTER — Ambulatory Visit: Payer: 59

## 2015-01-26 ENCOUNTER — Inpatient Hospital Stay (HOSPITAL_COMMUNITY): Payer: 59

## 2015-01-26 ENCOUNTER — Other Ambulatory Visit: Payer: 59

## 2015-01-26 LAB — BASIC METABOLIC PANEL
Anion gap: 4 — ABNORMAL LOW (ref 5–15)
CALCIUM: 8.2 mg/dL — AB (ref 8.9–10.3)
CO2: 24 mmol/L (ref 22–32)
CREATININE: 1.17 mg/dL — AB (ref 0.44–1.00)
Chloride: 111 mmol/L (ref 101–111)
GFR calc Af Amer: >60 mL/min (ref >60)
GFR, EST NON AFRICAN AMERICAN: 57 mL/min — AB (ref >60)
Glucose, Bld: 97 mg/dL (ref 65–99)
POTASSIUM: 3.5 mmol/L (ref 3.5–5.1)
SODIUM: 139 mmol/L (ref 135–145)

## 2015-01-26 MED ORDER — MORPHINE SULFATE (PF) 2 MG/ML IV SOLN
1.0000 mg | INTRAVENOUS | Status: DC | PRN
  Administered 2015-01-26 – 2015-02-02 (×16): 2 mg via INTRAVENOUS
  Filled 2015-01-26 (×16): qty 1

## 2015-01-26 NOTE — Progress Notes (Signed)
[facility] PROGRESS NOTE  Andrea Best XTG:[id number] DOB: 04/17/1970 DOA: 01/20/2015 PCP: Milagros Evener, MD Brief history: 44 year old lady with h/o gastric cancer s/p partial gastrectomy with metastatic disease, with last chemo therapy in the first week of October, and recent external biliary drain placement for obstruction on 9/29, presented to ED in the first week of October for sepsis. During the hospitalization she grew klebsiella and E coli in the blood and Enterobacter species ( CRE) from the biliary drain  (cultures were drawn from the biliary drain by IR). All of the bacteria were sensitive to ciprofloxacin and she was discharged on oral ciprofloxacin on 10/17 to complete the course.  She presents to cancer center on 10/26 for sepsis and acute renal failure from dehydration and this time her urine cultures grew 60,000 colonies resistant to ciprofloxacin, but sensitive to Augmentin and one blood culture growing klebsiella ,pan sensitive.   But because of her CRE, requested Dr Linus Salmons for recommendations, who put her on Avycaz. She has been on Avycaz from 10/28. I have requested ID Dr Tommy Medal for an official consult , who will see the patient in am.  Meanwhile during her hospitalization she has vomited at least once a day. She attributed it to having a full stomach. Dr Learta Codding recommended upper GI series, meanwhile I was planning to do a CT abdomen and pelvis with contrast, that would look at her biliary tract and stomach, when her creatinine improves.  Her creatinine is much better today, ordered CT abd and pelvis for tomorrow.   Assessment/Plan: Sepsis from klebsiella bacteremia and Ecoli in urine:  Fever, hypotensive and tachycardic on arrival to cancer center, with abnormal UA.  Lactic acid elevated.  Repeat lactic acid is normal after fluid boluses.  Repeat blood cultures show klebsiella. Urine cultures grew 60,000 e coli resistent to ciprofloxacin.  Discussed with Dr  Linus Salmons recommended change in antibiotics  To avycaz, dosing as per pharmacy.  Because of her recent h/o of e coli and klebsiella bacteremia, we have started her on broad spectrum antibiotics with vancomycin and zosyn , later on discussed with ID and changed to [place].   Further recommendations on antibiotics will be given by Dr Tommy Medal.    Anemia: Hemoglobin is 10.3, slightly higher than her usual numbers. Probably hemo concentrated. repeat hemoglobin is 7.  She is jehovah's witness and will not receive blood products.    Metastatic gastric cancer: Further management as per Dr Learta Codding.    Sjogren's disease: plaquenil on hold.   Acute renal failure: possibly from the hypotension and decreased renal perfusion. Improving with IV fluids.  Continue with IV fluids.    Asthma; no wheezing heard.    Persistent vomiting: improved with zofran and phenergan.  Upper GI study done today, results pending.  Plan for CT abd and pelvis in am.   Code Status: full code. Family Communication: family at bedside.  Disposition Plan: pending , possibly home when the repeat cultures are negative.    Consultants:  Oncology Dr Benay Spice.  ID Dr Tommy Medal  IR kevin Procedures: None  Antibiotics:  avycaz 10/28 till date.    vancomycin and zosyn -10/26 till 10/27  IV cipro on 10/27 to 10/28.    HPI/Subjective: Not feeling good.  hasnt eaten.   Objective: Filed Vitals:   01/26/15 1455  BP: 117/71  Pulse: 91  Temp: 98.7 F (37.1 C)  Resp: 20    Intake/Output Summary (Last 24 hours) at 01/26/15 1549 Last data  filed at 01/26/15 1523  Gross per 24 hour  Intake    630 ml  Output    525 ml  Net    105 ml   Filed Weights   01/20/15 1744  Weight: 60.056 kg (132 lb 6.4 oz)    Exam:   General:  Alert afebrile comfortable.   Cardiovascular: s1s2  Respiratory: ctab  Abdomen: soft nt ND BS+  Musculoskeletal: no pedal edema.   Data Reviewed: Basic Metabolic  Panel:  Recent Labs Lab 01/22/15 1118 01/23/15 0510 01/24/15 0401 01/25/15 0333 01/26/15 0305  NA 140 139 138 137 139  K 3.9 4.6 3.6 3.6 3.5  CL 112* 112* 111 111 111  CO2 21* 22 21* 23 24  GLUCOSE 96 82 96 88 97  BUN 10 7 6  <5* <5*  CREATININE 1.65* 1.57* 1.47* 1.20* 1.17*  CALCIUM 8.3* 8.2* 8.5* 8.2* 8.2*   Liver Function Tests:  Recent Labs Lab 01/20/15 0943 01/21/15 1142 01/23/15 0510  AST 26 23 39  ALT 18 14 13*  ALKPHOS 143 87 80  BILITOT 0.91 1.1 1.2  PROT 9.2* 6.2* 6.4*  ALBUMIN 2.9* 2.1* 2.0*   No results for input(s): LIPASE, AMYLASE in the last 168 hours. No results for input(s): AMMONIA in the last 168 hours. CBC:  Recent Labs Lab 01/20/15 0943 01/21/15 1142 01/24/15 0401  WBC 8.9 6.2 6.1  NEUTROABS 5.9  --   --   HGB 10.3* 7.1* 8.0*  HCT 32.2* 21.8* 24.7*  MCV 81.8 81.0 81.0  PLT 233 108* 148*   Cardiac Enzymes: No results for input(s): CKTOTAL, CKMB, CKMBINDEX, TROPONINI in the last 168 hours. BNP (last 3 results)  Recent Labs  01/06/15 1450  BNP 108.2*    ProBNP (last 3 results) No results for input(s): PROBNP in the last 8760 hours.  CBG: No results for input(s): GLUCAP in the last 168 hours.  Recent Results (from the past 240 hour(s))  Urine Culture     Status: None   Collection Time: 01/20/15  3:21 PM  Result Value Ref Range Status   Urine Culture, Routine Culture, Urine  Final    Comment: Final - ===== COLONY COUNT: ===== 60,000 COLONIES/ML ESCHERICHIA COLI  ------------------------------------------------------------------------  ESCHERICHIA COLI     AMPICILLIN                       MIC      Resistant       >=32 ug/ml    AMOX/CLAVULANIC                  MIC      Indeterminate     16 ug/ml    AMPICILLIN/SUL                   MIC      Resistant       >=32 ug/ml    PIPERACILLIN/TAZO                MIC      Resistant      >=128 ug/ml    IMIPENEM                         MIC      Sensitive     <=0.25 ug/ml    CEFAZOLIN                         MIC  Resistant          8 ug/ml    CEFTRIAXONE                      MIC      Sensitive        <=1 ug/ml    CEFTAZIDIME                      MIC      Sensitive        <=1 ug/ml    CEFEPIME                         MIC      Sensitive        <=1 ug/ml    GENTAMICIN                       MIC      Sensitive        <=1 ug/ml    TOBRAMYCIN                       MIC      Sensitive        <=1 ug/ml    CIPROFLOXACIN                     MIC      Resistant        >=4 ug/ml    LEVOFLOXACIN                     MIC      Resistant        >=8 ug/ml    NITROFURANTOIN                   MIC      Sensitive       <=16 ug/ml    TRIMETH/SULFA                    MIC      Resistant      >=320 ug/ml  ------------------------------------------------------------------------ NR=NOT REPORTABLE,SEE COMMENT ORAL therapy:A cefazolin MIC of <32 predicts  susceptibility to the oral agents cefaclor, cefdinir,cefpodoxime,cefprozil,cefuroxime, cephalexin,and loracarbef when used for therapy  of uncomplicated UTIs due to E.coli,K.pneumomiae, and P.mirabilis. PARENTERAL therapy: A cefazolin MIC of >8 indicates resistance to parenteral cefazolin. An alternate test method must be performed to confirm susceptibility to parenteral cefazolin. END OF REPORT   Blood culture (routine single)     Status: None   Collection Time: 01/20/15  3:22 PM  Result Value Ref Range Status   Blood Culture, Routine Culture, Blood  Final    Comment: ------------------------------------------------------------------------Gram Stain Report Called to,Read Back Byand Verified With:ROSALYND W @ 9:39 AM10/28/16 BY [email or web address] - ===== FINAL REPORT =====KLEBSIELLA  PNEUMONIAE------------------------------------------------------------------------ KLEBSIELLA PNEUMONIAE   AMPICILLIN                       MIC      Resistant       >=32 ug/ml   AMOX/CLAVULANIC                  MIC      Sensitive        <=2 ug/ml    AMPICILLIN/SUL  MIC      Sensitive          4 ug/ml   PIPERACILLIN/TAZO                MIC      Sensitive          8 ug/ml   IMIPENEM                         MIC      Sensitive     <=0.25 ug/ml   CEFAZOLIN                        MIC       NR               <=4 ug/ml   CEFTRIAXONE                      MIC      Sensitive        <=1 ug/ml   CEFTAZIDIME                      MIC      Sensitive        <=1 ug/ml   CEFEPIME                         MIC      Sensitive        <=1 ug/ml   GENTAMICIN                        MI C      Sensitive        <=1 ug/ml   TOBRAMYCIN                       MIC      Sensitive        <=1 ug/ml   CIPROFLOXACIN                    MIC      Indeterminate        ug/ml   LEVOFLOXACIN                     MIC       Indeterminate      4 ug/ml   TRIMETH/SULFA                    MIC      Sensitive         40 ug/ml------------------------------------------------------------------------NR=NOT REPORTABLE,SEE COMMENTORAL therapy:A cefazolin MIC of <32 predicts  susceptibility to the oral agents cefaclor,cefdinir,cefpodoxime,cefprozil,cefuroxime,cephalexin,and loracarbef when used for therapy of uncomplicated UTIs due to E.coli,K.pneumomiae,and P.mirabilis. PARENTERAL therapy: A cefazolinMIC of >8 indicates  resistance to parenteralcefazolin. An alternate test method must beperformed to confirm susceptibility to parenteralcefazolin.END OF REPORT   Urine culture     Status: None   Collection Time: 01/20/15  7:30 PM  Result Value Ref Range Status   Specimen Description URINE, RANDOM  Final   Special Requests NONE  Final   Culture   Final    9,000 COLONIES/mL INSIGNIFICANT GROWTH Performed at [facility]    Report Status 01/22/2015 FINAL  Final  Culture, blood (routine x 2)     Status: None (Preliminary result)   Collection Time:  01/25/15  6:49 PM  Result Value Ref Range Status   Specimen Description BLOOD RIGHT HAND  Final   Special Requests IN PEDIATRIC BOTTLE 3CC  Final   Culture   Final     NO GROWTH < 24 HOURS Performed at [facility]    Report Status PENDING  Incomplete  Culture, blood (routine x 2)     Status: None (Preliminary result)   Collection Time: 01/25/15  6:49 PM  Result Value Ref Range Status   Specimen Description BLOOD LEFT ANTECUBITAL  Final   Special Requests BOTTLES DRAWN AEROBIC AND ANAEROBIC 10CC EA  Final   Culture PENDING  Incomplete   Report Status PENDING  Incomplete     Studies: No results found.  Scheduled Meds: . ceftazidime avibactam (AVYCAZ) IVPB  2.5 g Intravenous 3 times per day  . enoxaparin (LOVENOX) injection  30 mg Subcutaneous Q24H  . ferrous sulfate  325 mg Oral BID WC  . morphine  15 mg Oral Q12H   Continuous Infusions: . sodium chloride 100 mL/hr at 01/26/15 1511    Active Problems:   Sepsis ([place])   Bacteremia   Nausea & vomiting    Time spent: 25 min    [facility] Pager [phone] If 7PM-7AM, please contact night-coverage at [email or web address], password [facility] 01/26/2015, 3:49 PM  LOS: 6 days

## 2015-01-26 NOTE — Care Management Note (Signed)
Case Management Note  Patient Details  Name: QUANTA ROBERTSHAW MRN: [id number] Date of Birth: 12/24/1970  Subjective/Objective:Gastric Ca-biliary drain,vomiting, SBFT today.NPO,iv abx,ivf,iv antiemetics. [facility] active-following for HHRN-await HHRN, f65f order if needed.                    Action/Plan:d/c plan home w/HHC.   Expected Discharge Date:                  Expected Discharge Plan:  [place]  In-House Referral:     Discharge planning Services  CM Consult  Post Acute Care Choice:  [place] (Active w/[facility]) Choice offered to:     DME Arranged:    DME Agency:     HH Arranged:    HH Agency:     Status of Service:  In process, will continue to follow  Medicare Important Message Given:    Date Medicare IM Given:    Medicare IM give by:    Date Additional Medicare IM Given:    Additional Medicare Important Message give by:     If discussed at [place] of Stay Meetings, dates discussed:    Additional Comments:  Dessa Phi, RN 01/26/2015, 11:44 AM

## 2015-01-27 ENCOUNTER — Encounter (HOSPITAL_COMMUNITY): Payer: Self-pay | Admitting: Radiology

## 2015-01-27 ENCOUNTER — Inpatient Hospital Stay (HOSPITAL_COMMUNITY): Payer: 59

## 2015-01-27 DIAGNOSIS — Z9689 Presence of other specified functional implants: Secondary | ICD-10-CM

## 2015-01-27 DIAGNOSIS — A4151 Sepsis due to Escherichia coli [E. coli]: Secondary | ICD-10-CM

## 2015-01-27 DIAGNOSIS — Z228 Carrier of other infectious diseases: Secondary | ICD-10-CM | POA: Insufficient documentation

## 2015-01-27 DIAGNOSIS — C169 Malignant neoplasm of stomach, unspecified: Secondary | ICD-10-CM

## 2015-01-27 DIAGNOSIS — Z8619 Personal history of other infectious and parasitic diseases: Secondary | ICD-10-CM

## 2015-01-27 DIAGNOSIS — A419 Sepsis, unspecified organism: Secondary | ICD-10-CM

## 2015-01-27 DIAGNOSIS — B9620 Unspecified Escherichia coli [E. coli] as the cause of diseases classified elsewhere: Secondary | ICD-10-CM | POA: Insufficient documentation

## 2015-01-27 DIAGNOSIS — B9689 Other specified bacterial agents as the cause of diseases classified elsewhere: Secondary | ICD-10-CM

## 2015-01-27 DIAGNOSIS — R7881 Bacteremia: Secondary | ICD-10-CM | POA: Insufficient documentation

## 2015-01-27 DIAGNOSIS — K831 Obstruction of bile duct: Secondary | ICD-10-CM

## 2015-01-27 LAB — CREATININE, SERUM
CREATININE: 1.11 mg/dL — AB (ref 0.44–1.00)
GFR calc Af Amer: >60 mL/min (ref >60)

## 2015-01-27 LAB — CULTURE, BLOOD (SINGLE)

## 2015-01-27 MED ORDER — ENOXAPARIN SODIUM 40 MG/0.4ML ~~LOC~~ SOLN
40.0000 mg | SUBCUTANEOUS | Status: DC
  Administered 2015-01-27 – 2015-02-04 (×9): 40 mg via SUBCUTANEOUS
  Filled 2015-01-27 (×9): qty 0.4

## 2015-01-27 MED ORDER — IOHEXOL 300 MG/ML  SOLN: 25.0000 mL | INTRAMUSCULAR | Status: AC

## 2015-01-27 MED ORDER — IOHEXOL 300 MG/ML  SOLN
100.0000 mL | Freq: Once | INTRAMUSCULAR | Status: AC | PRN
  Administered 2015-01-27: 100 mL via INTRAVENOUS

## 2015-01-27 MED ORDER — SODIUM CHLORIDE 0.9 % IV SOLN
3.0000 g | Freq: Four times a day (QID) | INTRAVENOUS | Status: DC
  Administered 2015-01-27 – 2015-02-05 (×35): 3 g via INTRAVENOUS
  Filled 2015-01-27 (×37): qty 3

## 2015-01-27 NOTE — Progress Notes (Addendum)
[facility] PROGRESS NOTE  Andrea Best SAY:[id number] DOB: 09/21/1970 DOA: 01/20/2015 PCP: Milagros Evener, MD Brief history: 44 year old lady with h/o gastric cancer s/p partial gastrectomy with metastatic disease, with last chemo therapy in the first week of October, and recent external biliary drain placement for obstruction on 9/29, presented to ED in the first week of October for sepsis. During the hospitalization she grew klebsiella and E coli in the blood and Enterobacter species ( CRE) from the biliary drain  (cultures were drawn from the biliary drain by IR). All of the bacteria were sensitive to ciprofloxacin and she was discharged on oral ciprofloxacin on 10/17 to complete the course.  She presents to cancer center on 10/26 for sepsis and acute renal failure from dehydration and this time her urine cultures grew 60,000 colonies resistant to ciprofloxacin, but sensitive to Augmentin and one blood culture growing klebsiella ,pan sensitive.   But because of her CRE, requested Dr Linus Salmons for recommendations, who put her on Avycaz. She has been on Avycaz from 10/28. I have requested ID Dr Tommy Medal for an official consult , who will see the patient in am.  Meanwhile during her hospitalization she has vomited at least once a day. She attributed it to having a full stomach. Dr Learta Codding recommended upper GI series, meanwhile I was planning to do a CT abdomen and pelvis with contrast, that would look at her biliary tract and stomach, when her creatinine improves.  Her creatinine is much better today, ordered CT abd and pelvis for tomorrow.   Assessment/Plan: Sepsis  -improving, was febrile, hypotensive and tachycardic on arrival to cancer center,  -Lactic acid elevated.  Repeat lactic acid normalized after fluid boluses.  -Blood cultures 10/26 show klebsiella 1/2 . Urine cultures grew 60,000 e coli resistent to ciprofloxacin.  -repeat Blood Cx 10/31: negative -Dr.Akula d/w Dr Linus Salmons  recommended Zadie Rhine, dosing per pharmacy. -D/w Dr.VanDam today, will get CT abd pelvis, suspect Biliary source -last admission with e coli and klebsiella bacteremia and Enterobacter in biliary fluid  Metastatic gastric Ca -Chemo on hold -followed by Dr.Sherril  Malignant biliary obstruction  -s/p I/E biliary drain 11/19/14 with last exchange on 12/24/14; recent biliary sepsis -IR following and drain felt to be functioning well -drain care, FU with IR to make decision regarding biliary stent  Nausea/Vomiting, Abd pain -due to above, supportive care, improving -CT as discussed above -upper Gi series unremarkable  Anemia of chronic disease and chemo -stable, monitor -he is jehovah's witness and will not receive blood products.   Sjogren's disease: plaquenil on hold.   Acute renal failure: -possibly from the hypotension and decreased renal perfusion. Improving with IV fluids.  Continue with IV fluids.   Asthma; -no wheezing  DVT proph: lovenox  Code Status: full code. Family Communication: family at bedside.  Disposition Plan: when stable   Consultants:  Oncology Dr Benay Spice.  ID Dr Tommy Medal  IR  Procedures: None  Antibiotics:  avycaz 10/28 till date.    vancomycin and zosyn -10/26 till 10/27  IV cipro on 10/27 to 10/28.    HPI/Subjective: Feels better, less abd pain, no vomiting  Objective: Filed Vitals:   01/26/15 2124  BP: 116/75  Pulse: 95  Temp: 98.5 F (36.9 C)  Resp: 18    Intake/Output Summary (Last 24 hours) at 01/27/15 1223 Last data filed at 01/26/15 2300  Gross per 24 hour  Intake   1110 ml  Output   1600 ml  Net   -  490 ml   Filed Weights   01/20/15 1744  Weight: 60.056 kg (132 lb 6.4 oz)    Exam:   General:  Alert afebrile comfortable.   Cardiovascular: s1s2  Respiratory: ctab,   Abdomen: soft nt ND BS+, biliary drain  Musculoskeletal: no pedal edema.   Data Reviewed: Basic Metabolic Panel:  Recent Labs Lab  01/22/15 1118 01/23/15 0510 01/24/15 0401 01/25/15 0333 01/26/15 0305 01/27/15 0425  NA 140 139 138 137 139  --   K 3.9 4.6 3.6 3.6 3.5  --   CL 112* 112* 111 111 111  --   CO2 21* 22 21* 23 24  --   GLUCOSE 96 82 96 88 97  --   BUN 10 7 6  <5* <5*  --   CREATININE 1.65* 1.57* 1.47* 1.20* 1.17* 1.11*  CALCIUM 8.3* 8.2* 8.5* 8.2* 8.2*  --    Liver Function Tests:  Recent Labs Lab 01/21/15 1142 01/23/15 0510  AST 23 39  ALT 14 13*  ALKPHOS 87 80  BILITOT 1.1 1.2  PROT 6.2* 6.4*  ALBUMIN 2.1* 2.0*   No results for input(s): LIPASE, AMYLASE in the last 168 hours. No results for input(s): AMMONIA in the last 168 hours. CBC:  Recent Labs Lab 01/21/15 1142 01/24/15 0401  WBC 6.2 6.1  HGB 7.1* 8.0*  HCT 21.8* 24.7*  MCV 81.0 81.0  PLT 108* 148*   Cardiac Enzymes: No results for input(s): CKTOTAL, CKMB, CKMBINDEX, TROPONINI in the last 168 hours. BNP (last 3 results)  Recent Labs  01/06/15 1450  BNP 108.2*    ProBNP (last 3 results) No results for input(s): PROBNP in the last 8760 hours.  CBG: No results for input(s): GLUCAP in the last 168 hours.  Recent Results (from the past 240 hour(s))  Blood culture (routine single)     Status: None   Collection Time: 01/20/15  3:21 PM  Result Value Ref Range Status   Blood Culture, Routine Culture, Blood  Final    Comment: Final - ===== FINAL REPORT =====NO GROWTH 5 DAYS  Urine Culture     Status: None   Collection Time: 01/20/15  3:21 PM  Result Value Ref Range Status   Urine Culture, Routine Culture, Urine  Final    Comment: Final - ===== COLONY COUNT: ===== 60,000 COLONIES/ML ESCHERICHIA COLI  ------------------------------------------------------------------------  ESCHERICHIA COLI     AMPICILLIN                       MIC      Resistant       >=32 ug/ml    AMOX/CLAVULANIC                  MIC      Indeterminate     16 ug/ml    AMPICILLIN/SUL                   MIC      Resistant       >=32 ug/ml     PIPERACILLIN/TAZO                MIC      Resistant      >=128 ug/ml    IMIPENEM                         MIC      Sensitive     <=0.25 ug/ml    CEFAZOLIN  MIC      Resistant          8 ug/ml    CEFTRIAXONE                      MIC      Sensitive        <=1 ug/ml    CEFTAZIDIME                      MIC      Sensitive        <=1 ug/ml    CEFEPIME                         MIC      Sensitive        <=1 ug/ml    GENTAMICIN                       MIC      Sensitive        <=1 ug/ml    TOBRAMYCIN                       MIC      Sensitive        <=1 ug/ml    CIPROFLOXACIN                     MIC      Resistant        >=4 ug/ml    LEVOFLOXACIN                     MIC      Resistant        >=8 ug/ml    NITROFURANTOIN                   MIC      Sensitive       <=16 ug/ml    TRIMETH/SULFA                    MIC      Resistant      >=320 ug/ml  ------------------------------------------------------------------------ NR=NOT REPORTABLE,SEE COMMENT ORAL therapy:A cefazolin MIC of <32 predicts  susceptibility to the oral agents cefaclor, cefdinir,cefpodoxime,cefprozil,cefuroxime, cephalexin,and loracarbef when used for therapy  of uncomplicated UTIs due to E.coli,K.pneumomiae, and P.mirabilis. PARENTERAL therapy: A cefazolin MIC of >8 indicates resistance to parenteral cefazolin. An alternate test method must be performed to confirm susceptibility to parenteral cefazolin. END OF REPORT   Blood culture (routine single)     Status: None   Collection Time: 01/20/15  3:22 PM  Result Value Ref Range Status   Blood Culture, Routine Culture, Blood  Final    Comment: ------------------------------------------------------------------------Gram Stain Report Called to,Read Back Byand Verified With:ROSALYND W @ 9:39 AM10/28/16 BY [email or web address] - ===== FINAL REPORT =====KLEBSIELLA  PNEUMONIAE------------------------------------------------------------------------ KLEBSIELLA PNEUMONIAE    AMPICILLIN                       MIC      Resistant       >=32 ug/ml   AMOX/CLAVULANIC                  MIC      Sensitive        <=2 ug/ml    AMPICILLIN/SUL  MIC      Sensitive          4 ug/ml   PIPERACILLIN/TAZO                MIC      Sensitive          8 ug/ml   IMIPENEM                         MIC      Sensitive     <=0.25 ug/ml   CEFAZOLIN                        MIC       NR               <=4 ug/ml   CEFTRIAXONE                      MIC      Sensitive        <=1 ug/ml   CEFTAZIDIME                      MIC      Sensitive        <=1 ug/ml   CEFEPIME                         MIC      Sensitive        <=1 ug/ml   GENTAMICIN                        MI C      Sensitive        <=1 ug/ml   TOBRAMYCIN                       MIC      Sensitive        <=1 ug/ml   CIPROFLOXACIN                    MIC      Indeterminate        ug/ml   LEVOFLOXACIN                     MIC       Indeterminate      4 ug/ml   TRIMETH/SULFA                    MIC      Sensitive         40 ug/ml------------------------------------------------------------------------NR=NOT REPORTABLE,SEE COMMENTORAL therapy:A cefazolin MIC of <32 predicts  susceptibility to the oral agents cefaclor,cefdinir,cefpodoxime,cefprozil,cefuroxime,cephalexin,and loracarbef when used for therapy of uncomplicated UTIs due to E.coli,K.pneumomiae,and P.mirabilis. PARENTERAL therapy: A cefazolinMIC of >8 indicates  resistance to parenteralcefazolin. An alternate test method must beperformed to confirm susceptibility to parenteralcefazolin.END OF REPORT   Urine culture     Status: None   Collection Time: 01/20/15  7:30 PM  Result Value Ref Range Status   Specimen Description URINE, RANDOM  Final   Special Requests NONE  Final   Culture   Final    9,000 COLONIES/mL INSIGNIFICANT GROWTH Performed at [facility]    Report Status 01/22/2015 FINAL  Final  Culture, blood (routine x 2)     Status: None (Preliminary result)   Collection  Time: 01/25/15  6:49 PM  Result Value Ref Range Status   Specimen Description BLOOD RIGHT HAND  Final   Special Requests IN PEDIATRIC BOTTLE 3CC  Final   Culture   Final    NO GROWTH < 24 HOURS Performed at [facility]    Report Status PENDING  Incomplete  Culture, blood (routine x 2)     Status: None (Preliminary result)   Collection Time: 01/25/15  6:49 PM  Result Value Ref Range Status   Specimen Description BLOOD LEFT ANTECUBITAL  Final   Special Requests BOTTLES DRAWN AEROBIC AND ANAEROBIC 10CC EA  Final   Culture PENDING  Incomplete   Report Status PENDING  Incomplete     Studies: Dg Ugi W/small Bowel  01/26/2015  CLINICAL DATA:  44 year old female with history of gastric cancer post partial gastrectomy. Last chemotherapy first week of October. Persistent vomiting. Subsequent encounter. EXAM: UPPER GI SERIES WITH SMALL BOWEL FOLLOW-THROUGH FLUOROSCOPY TIME:  Fluoroscopy Time 3 minutes and 3 seconds. TECHNIQUE: Single contrast upper GI series using thin barium. Subsequently, serial images of the small bowel were obtained including spot views of the terminal ileum. COMPARISON:  01/06/2015 CT. FINDINGS: Scout view reveals surgical staples left upper quadrant and surgical clips right upper quadrant with biliary drain in place (pigtail portion within the duodenum). No esophageal obstructing lesion. Mild impression by aortic arch. No mucosal abnormality although double contrast technique was not able to be utilized. Post partial gastrectomy. Gastric remnant without obvious mass although evaluation limited by single contrast technique. Contrast traversed but freely through the gastric remnant into small bowel anastomosis. Slow transit time through small bowel without obstructing lesion identified. IMPRESSION: Post partial gastrectomy. Immediate transit of contrast through gastric remnant into small bowel anastomosis. Slow transit through small bowel without obstructing lesion noted. Single  contrast technique had to be utilized secondary to patient's nausea. Electronically Signed   By: Genia Del M.D.   On: 01/26/2015 16:39    Scheduled Meds: . ceftazidime avibactam (AVYCAZ) IVPB  2.5 g Intravenous 3 times per day  . enoxaparin (LOVENOX) injection  40 mg Subcutaneous Q24H  . ferrous sulfate  325 mg Oral BID WC  . morphine  15 mg Oral Q12H   Continuous Infusions: . 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

## 2015-01-27 NOTE — Progress Notes (Signed)
IP PROGRESS NOTE  Subjective: She had nausea and vomiting with upper abdominal pain after the upper GI study yesterday. No pain or nausea this morning.  Objective: Vital signs in last 24 hours: Temp:  [98.2 F (36.8 C)-98.7 F (37.1 C)] 98.2 F (36.8 C) (11/02 1228) Pulse Rate:  [91-100] 100 (11/02 1228) Resp:  [18-20] 20 (11/02 1228) BP: (114-117)/(68-75) 114/68 mmHg (11/02 1228) SpO2:  [100 %] 100 % (11/02 1228)  Intake/Output from previous day: 11/01 0701 - 11/02 0700 In: 1110 [I.V.:1010; IV Piggyback:100] Out: 1600 [Urine:1250; Drains:350] Intake/Output this shift: Total I/O In: 240 [P.O.:240] Out: 600 [Urine:500; Drains:100]  Lungs: Decreased breath sounds at the lower chest, no respiratory distress Cardiovascular: Regular rate and rhythm Abdomen soft and nontender. No hepatomegaly. Right upper quadrant drain site with a gauze dressing No leg edema. Port-A-Cath without erythema.  Lab Results:  No results for input(s): WBC, HGB, HCT, PLT in the last 72 hours. BMET  Recent Labs  01/25/15 0333 01/26/15 0305 01/27/15 0425  NA 137 139  --   K 3.6 3.5  --   CL 111 111  --   CO2 23 24  --   GLUCOSE 88 97  --   BUN <5* <5*  --   CREATININE 1.20* 1.17* 1.11*  CALCIUM 8.2* 8.2*  --    lactic acid 2.4  Studies/Results: Dg Ugi W/small Bowel  01/26/2015  CLINICAL DATA:  44 year old female with history of gastric cancer post partial gastrectomy. Last chemotherapy first week of October. Persistent vomiting. Subsequent encounter. EXAM: UPPER GI SERIES WITH SMALL BOWEL FOLLOW-THROUGH FLUOROSCOPY TIME:  Fluoroscopy Time 3 minutes and 3 seconds. TECHNIQUE: Single contrast upper GI series using thin barium. Subsequently, serial images of the small bowel were obtained including spot views of the terminal ileum. COMPARISON:  01/06/2015 CT. FINDINGS: Scout view reveals surgical staples left upper quadrant and surgical clips right upper quadrant with biliary drain in place (pigtail  portion within the duodenum). No esophageal obstructing lesion. Mild impression by aortic arch. No mucosal abnormality although double contrast technique was not able to be utilized. Post partial gastrectomy. Gastric remnant without obvious mass although evaluation limited by single contrast technique. Contrast traversed but freely through the gastric remnant into small bowel anastomosis. Slow transit time through small bowel without obstructing lesion identified. IMPRESSION: Post partial gastrectomy. Immediate transit of contrast through gastric remnant into small bowel anastomosis. Slow transit through small bowel without obstructing lesion noted. Single contrast technique had to be utilized secondary to patient's nausea. Electronically Signed   By: Genia Del M.D.   On: 01/26/2015 16:39    Medications: Reviewed  Assessment/Plan: 1. Gastric cancer status post upper endoscopy 09/22/2013 with findings of a partially obstructing oozing cratered gastric ulcer in the gastric antrum.  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   CT scans chest/abdomen/pelvis 10/03/2013 showed a 7 x 5 mm groundglass opacity in the superior segment of the right lower lobe; a 4.5 mm nodular density in the right upper lobe; bilateral axillary adenopathy; low density area anteriorly in the left hepatic lobe most consistent with fatty infiltration; another hypodense area within the medial segment of the left hepatic lobe; severe gastric distention; mild bilateral inguinal adenopathy.   Subtotal gastrectomy and lymph node dissection with creation of a gastrojejunostomy 10/10/2013. No evidence of distant metastatic disease noted at the time of surgery. No evidence of liver metastases. 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 Tumor involved the serosal surface. Resection margins were negative.  10 of 17 lymph nodes were positive for metastatic adenocarcinoma.  Her-2 not amplified   Cycle 1 weekly 5-FU/leucovorin 11/14/2013, dose reduced beginning with week 3 secondary to mucositis and hand/foot syndrome.   Cycle 2 weekly 5-FU/leucovorin beginning 12/19/2013.  Initiation of radiation/Xeloda 01/26/2014. She decided to discontinue further radiation/Xeloda after one fraction due to significant nausea/vomiting.  Cycle 3 weekly 5-FU/leucovorin beginning 02/13/2014  Cycle 4 weekly 5-FU/leucovorin beginning 03/18/2014  Cycle 5 weekly 5-FU/leucovorin beginning 04/28/2014  CTs 06/16/2014 with new enhancing nodular lesions along the ventral peritoneal surface  CT-guided biopsy of an anterior peritoneal lesion on 06/24/2014 confirmed poorly differentiated adenocarcinoma  CTs 09/03/2014 with an increasing confluent enhancing lesion located at the ventral peritoneal surface midline of the anterior abdomen. Lesion inseparable from the posterior aspect of the abdominal wall.  CT abdomen/pelvis 11/18/2014 with significant progression of metastatic disease with enlarging metastasis within the anterior abdominal wall, enlargement of multiple peritoneal implants, interval development of masses within the pancreatic head and caudate lobe of the liver. 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 New complex solid and cystic left adnexal lesion.  Placement of percutaneous internal/external biliary drain 11/19/2014  Cycle 1 FOLFOX 12/09/2014  Cycle 2 FOLFOX 12/23/2014  CT 01/06/2015 with a decrease in the anterior abdominal wall tumor 2. Gastric outlet obstruction secondary to #1. Resolved. 3. Nausea/vomiting secondary to #2. Resolved. 4. Abdominal pain secondary to #1. 5. History of weight loss. 6. Microcytic anemia, likely iron deficiency. She received iron dextran 10/11/2013. 7. Lupus/Sjgren's. 8. Pneumonia during hospitalization July 2015.  9. Abdominal abscesses with  body fluid culture 10/20/2013 showing moderate Candida tropicalis. She was discharged home on a 4 week course of fluconazole.  10. Mucositis and hand/foot syndrome following cycle 1-week #3 5-FU/leucovorin, the 5-FU and leucovorin were dose reduced. 11. 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 12. Status post LEEP 11/07/2013. 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 Pap smear 06/08/2014-negative for intraepithelial lesions or malignancy 13. Gram-negative sepsis 01/06/2015-blood cultures positive for Klebsiella/Escherichia coli, culture from biliary drainage positive for Enterobacter 14. New right hydronephrosis on CT 01/06/2015, stable on ultrasound 01/21/2015 15. Admission 01/20/2015 with fever/sepsis, one blood culture positive for Klebsiella pneumoniae, urine culture positive for a resistant Escherichia coli  The upper GI study reveals no evidence of obstruction. The nausea may be related to the abdominal tumor burden or biliary obstruction.  We have not defined source for sepsis, though the most likely source is the biliary tract. Agree with CT to look for evidence of an abscess or persistent biliary dilatation.  Recommendations: 1. Continue antibiotics as recommended by infectious disease 2. CT abdomen/pelvis 3. Chemotherapy remains on hold    LOS: 7 days    Jullian Clayson, Dominica Severin, ANP/GNP-BC 01/27/2015 2:02 PM

## 2015-01-27 NOTE — Consult Note (Signed)
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 Broadus John   HPI: Andrea Best is an 44 y.o. female with history of metastatic gastric cancer s/p resection who underwent placement of biliary drain in 11/19/14 due to bilairy obstruction from her cancer with  recurrent biliary obstruction and exchange of the drain on 12/23/14. She was then admitted on 01/06/15 with septic shock. Blood cultures grew 2/2  From porta cath and peripheral site:    KLEBSIELLA PNEUMONIAE    Organism ID, Bacteria ESCHERICHIA COLI   Resulting Agency [facility]    Culture & Susceptibility      ESCHERICHIA COLI     Antibiotic Sensitivity Microscan Status    AMPICILLIN Sensitive 8 SENSITIVE Final    Method: MIC    AMPICILLIN/SULBACTAM Sensitive 4 SENSITIVE Final    Method: MIC    CEFAZOLIN Sensitive <=4 SENSITIVE Final    Method: MIC    CEFEPIME Sensitive <=1 SENSITIVE Final    Method: MIC    CEFTAZIDIME Sensitive <=1 SENSITIVE Final    Method: MIC    CEFTRIAXONE Sensitive <=1 SENSITIVE Final    Method: MIC    CIPROFLOXACIN Sensitive <=0.25 SENSITIVE Final    Method: MIC    GENTAMICIN Sensitive <=1 SENSITIVE Final    Method: MIC    IMIPENEM Sensitive <=0.25 SENSITIVE Final    Method: MIC    PIP/TAZO Sensitive <=4 SENSITIVE Final    Method: MIC    TRIMETH/SULFA Sensitive <=20 SENSITIVE Final    Method: MIC    Comments ESCHERICHIA COLI (MIC)    ESCHERICHIA COLI             KLEBSIELLA PNEUMONIAE     Antibiotic Sensitivity Microscan Status    AMPICILLIN Resistant >=32 RESISTANT Final    Method: MIC    AMPICILLIN/SULBACTAM Sensitive 4 SENSITIVE Final    Method: MIC    CEFAZOLIN Sensitive <=4 SENSITIVE Final    Method:  MIC    CEFEPIME Sensitive <=1 SENSITIVE Final    Method: MIC    CEFTAZIDIME Sensitive <=1 SENSITIVE Final    Method: MIC    CEFTRIAXONE Sensitive <=1 SENSITIVE Final    Method: MIC    CIPROFLOXACIN Sensitive <=0.25 SENSITIVE Final    Method: MIC    GENTAMICIN Sensitive <=1 SENSITIVE Final    Method: MIC    IMIPENEM Sensitive <=0.25 SENSITIVE Final    Method: MIC    PIP/TAZO Sensitive <=4 SENSITIVE Final    Method: MIC    TRIMETH/SULFA Sensitive <=20 SENSITIVE Final    Method: MIC    Comments KLEBSIELLA PNEUMONIAE (MIC)    KLEBSIELLA PNEUMONIAE              During this same admission culture was taken FROM THE BILIARY DRAIN (IE could easily reflect denizen of her plastic bag, drain rather than pathogen in her blie) that grew CRE   01/11/2015 FINAL    Organism ID, Bacteria ENTEROBACTER CLOACAE   Resulting Agency [facility]    Culture & Susceptibility      ENTEROBACTER CLOACAE     Antibiotic Sensitivity Microscan Status    CEFAZOLIN Resistant >=64 RESISTANT Final    Method: MIC    CEFEPIME Resistant 2 RESISTANT Final    Method: MIC    CEFTAZIDIME Resistant >=64  RESISTANT Final    Method: MIC    CEFTRIAXONE Resistant >=64 RESISTANT Final    Method: MIC    CIPROFLOXACIN Sensitive <=0.25 SENSITIVE Final    Method: MIC    GENTAMICIN Sensitive <=1 SENSITIVE Final    Method: MIC    IMIPENEM Resistant <=0.25 RESISTANT Final    Method: MIC    PIP/TAZO Resistant >=128 RESISTANT Final    Method: MIC    TRIMETH/SULFA Sensitive <=20 SENSITIVE Final    Method: MIC          During that admission she was treated with IV zosyn --> ciprofloxacin which she ultimately received orally and was DC to home and continued to take until day of admission on 01/20/15 when she was admitted with fever and hypotension, fatigue, nausea and vomiting. She denied having had dysuria at that time.  She had blood cultures  taken and these grew in 1/2 cultures:   KLEBSIELLA  PNEUMONIAE------------------------------------------------------------------------  AMPICILLIN            MIC   Resistant    >=32 ug/ml  AMOX/CLAVULANIC         MIC   Sensitive    <=2 ug/ml   AMPICILLIN/SUL          MIC   Sensitive     4 ug/ml  PIPERACILLIN/TAZO        MIC   Sensitive     8 ug/ml   IMIPENEM             MIC   Sensitive   <=0.25 ug/ml   CEFAZOLIN            MIC    <=4 ug/ml   CEFTRIAXONE           MIC   Sensitive    <=1 ug/ml  CEFTAZIDIME           MIC   Sensitive    <=1 ug/ml   CEFEPIME             MIC   Sensitive    <=1 ug/ml   GENTAMICIN                          MIC   Sensitive    <=1 ug/ml   TOBRAMYCIN            MIC   Sensitive    <=1 ug/ml  CIPROFLOXACIN          MIC   Indeterminate    ug/ml  LEVOFLOXACIN           MIC    Indeterminate   4 ug/ml  TRIMETH/SULFA          MIC   Sensitive      From her urine she grew  From culture taken at 1521: ESBL  ESCHERICHIA COLI    AMPICILLIN            MIC   Resistant    >=32 ug/ml   AMOX/CLAVULANIC         MIC   Indeterminate   16 ug/ml   AMPICILLIN/SUL          MIC   Resistant    >=32 ug/ml   PIPERACILLIN/TAZO        MIC   Resistant   >=128 ug/ml   IMIPENEM             MIC   Sensitive   <=0.25 ug/ml   CEFAZOLIN  MIC   Resistant     8 ug/ml   CEFTRIAXONE           MIC   Sensitive    <=1 ug/ml   CEFTAZIDIME           MIC   Sensitive    <=1 ug/ml   CEFEPIME             MIC   Sensitive    <=1 ug/ml   GENTAMICIN            MIC   Sensitive    <=1 ug/ml     TOBRAMYCIN            MIC   Sensitive    <=1 ug/ml   CIPROFLOXACIN          MIC   Resistant    >=4 ug/ml   LEVOFLOXACIN           MIC   Resistant    >=8 ug/ml   NITROFURANTOIN          MIC   Sensitive    <=16 ug/ml   TRIMETH/SULFA          MIC   Resistant   >=320 ug/ml   A second culture taken only 4:5 hours later from urine yielded only 9k colonies  She was initially placed on IV zosyn, vancomycin then changed to [place] on 01/22/15 due to concerns re her 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  We have been asked to help with her antibiotic management of her recurrent biliary obstruction and sepsis.   Past Medical History  Diagnosis Date  . Eczema   . Lupus (HCC)     joint pain and extreme fatique - improved after plaquenil and prednisone therapy  . Sjogren's disease ([place])     caused pt to loose her teeth due to dry mouth;  prone to eye irritation and infection due to dry eye; affects immune system - PRONE TO INFECTIONS   . Allergy   . Tachycardia   . Protein-calorie malnutrition, severe ([place])   . Abnormal findings on esophagogastroduodenoscopy (EGD) 09/22/13    esophagitis  . Asthma     no recent flare ups - no inhalers - as of 11/19/13  . GERD (gastroesophageal reflux disease)   . Anemia     iron deficiency   . Pneumonia     health care -associated July 2015 post op gastrectomy surgery  . Gastric cancer ([place]) 09/22/13 &10/10/13    adenocarcinoma/metastatic  FIRST CHEMO WAS 11/14/13 - NEXT CHEMO TREATMENT IS 11/21/13.    Past Surgical History  Procedure Laterality Date  . Cesarean section      1997  . Laparotomy N/A 10/10/2013    Procedure: SUBTOTAL GASTRECTOMY WITH GASTRIC JEJUNOSOTMY;  Surgeon: Edward Jolly, MD;  Location: [facility];  Service: General;  Laterality: N/A;  . Portacath placement Right 11/20/2013    Procedure: INSERTION PORT-A-CATH with ULTRA SOUND;  Surgeon:  Adin Hector, MD;  Location: [facility];  Service: General;  Laterality: Right;    Social History:  reports that she has never smoked. She has never used smokeless tobacco. She reports that she does not drink alcohol or use illicit drugs.   Family History  Problem Relation Age of Onset  . Hypertension Mother   . Diabetes Mother   . Diabetes Maternal Grandmother   . Diabetes Maternal Grandfather     Allergies  Allergen Reactions  . Compazine [Prochlorperazine Maleate]  Other (See Comments)    Dystonia  . Azithromycin Rash  . Sulfa Antibiotics Rash    hallucinations  . Hydrocodone-Acetaminophen Nausea And Vomiting    Hallucinating   . 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 SHE DOES NOT WANT ANY BLOOD PRODUCTS OR FRACTIONS     Medications: I have reviewed patients current medications as documented in Epic Anti-infectives    Start     Dose/Rate Route Frequency Ordered Stop   01/25/15 2200  ceftazidime-avibactam (AVYCAZ) 2.5 g in dextrose 5 % 50 mL IVPB     2.5 g 25 mL/hr over 2 Hours Intravenous 3 times per day 01/25/15 1245     01/22/15 1400  ceftazidime-avibactam (AVYCAZ) 1.25 g in dextrose 5 % 50 mL IVPB    Comments:  CRE, pharmacy may adjust   1.25 g 25 mL/hr over 2 Hours Intravenous 3 times per day 01/22/15 1136 01/25/15 1647   01/22/15 1130  ciprofloxacin (CIPRO) IVPB 400 mg  Status:  Discontinued     400 mg 200 mL/hr over 60 Minutes Intravenous 3 times per day 01/22/15 1055 01/22/15 1353   01/21/15 2000  vancomycin (VANCOCIN) IVPB 1000 mg/200 mL premix  Status:  Discontinued     1,000 mg 200 mL/hr over 60 Minutes Intravenous Every 24 hours 01/20/15 1917 01/22/15 1055   01/21/15 0600  piperacillin-tazobactam (ZOSYN) IVPB 3.375 g  Status:  Discontinued     3.375 g 12.5 mL/hr over 240 Minutes Intravenous 3 times per day 01/20/15 1917 01/22/15 1055   01/20/15 1930  vancomycin (VANCOCIN) 1,250 mg in sodium chloride 0.9 % 250 mL IVPB     1,250 mg 166.7 mL/hr over 90 Minutes  Intravenous STAT 01/20/15 1917 01/20/15 2131   01/20/15 1930  piperacillin-tazobactam (ZOSYN) IVPB 3.375 g     3.375 g 100 mL/hr over 30 Minutes Intravenous STAT 01/20/15 1917 01/20/15 2031         ROS: as in HPI otherwise + nausea, vomiting, right LQ and flank pain and otherwise negative on 12 point review   Blood pressure 114/68, pulse 100, temperature 98.2 F (36.8 C), temperature source Oral, resp. rate 20, height 5' 6"  (1.676 m), weight 132 lb 6.4 oz (60.056 kg), last menstrual period 11/23/2014, SpO2 100 %. General: Alert and awake, oriented x3, not in any acute distress. HEENT: anicteric sclera,  EOMI, oropharynx clear and without exudate Cardiovascular: regular rate, normal r,  no murmur rubs or gallops Pulmonary: clear to auscultation bilaterally, no wheezing, rales or rhonchi Gastrointestinal: soft TTP in RUQ, nondistended, normal bowel sounds, Musculoskeletal: no  clubbing or edema noted bilaterally Skin, soft tissue: no rashes Neuro: nonfocal, strength and sensation intact   Results for orders placed or performed during the hospital encounter of 01/20/15 (from the past 48 hour(s))  Culture, blood (routine x 2)     Status: None (Preliminary result)   Collection Time: 01/25/15  6:49 PM  Result Value Ref Range   Specimen Description BLOOD RIGHT HAND    Special Requests IN PEDIATRIC BOTTLE 3CC    Culture      NO GROWTH 2 DAYS Performed at [facility]    Report Status PENDING   Culture, blood (routine x 2)     Status: None (Preliminary result)   Collection Time: 01/25/15  6:49 PM  Result Value Ref Range   Specimen Description BLOOD LEFT ANTECUBITAL    Special Requests BOTTLES DRAWN AEROBIC AND ANAEROBIC 10CC EA    Culture PENDING  Report Status PENDING   Basic metabolic panel     Status: Abnormal   Collection Time: 01/26/15  3:05 AM  Result Value Ref Range   Sodium 139 135 - 145 mmol/L   Potassium 3.5 3.5 - 5.1 mmol/L   Chloride 111 101 - 111 mmol/L    CO2 24 22 - 32 mmol/L   Glucose, Bld 97 65 - 99 mg/dL   BUN <5 (L) 6 - 20 mg/dL   Creatinine, Ser 1.17 (H) 0.44 - 1.00 mg/dL   Calcium 8.2 (L) 8.9 - 10.3 mg/dL   GFR calc non Af Amer 57 (L) >60 mL/min   GFR calc Af Amer >60 >60 mL/min    Comment: (NOTE) The eGFR has been calculated using the CKD EPI equation. This calculation has not been validated in all clinical situations. eGFR's persistently <60 mL/min signify possible Chronic Kidney Disease.    Anion gap 4 (L) 5 - 15  Creatinine, serum     Status: Abnormal   Collection Time: 01/27/15  4:25 AM  Result Value Ref Range   Creatinine, Ser 1.11 (H) 0.44 - 1.00 mg/dL   GFR calc non Af Amer >60 >60 mL/min   GFR calc Af Amer >60 >60 mL/min    Comment: (NOTE) The eGFR has been calculated using the CKD EPI equation. This calculation has not been validated in all clinical situations. eGFR's persistently <60 mL/min signify possible Chronic Kidney Disease.     ) Recent Results (from the past 720 hour(s))  Culture, blood (routine x 2)     Status: None   Collection Time: 01/06/15 10:42 AM  Result Value Ref Range Status   Specimen Description BLOOD LEFT ANTECUBITAL  Final   Special Requests BOTTLES DRAWN AEROBIC AND ANAEROBIC 5ML  Final   Culture  Setup Time   Final    GRAM NEGATIVE RODS IN BOTH AEROBIC AND ANAEROBIC BOTTLES CRITICAL RESULT CALLED TO, READ BACK BY AND VERIFIED WITH: [name] @0235  01/07/15 [name]    Culture   Final    KLEBSIELLA PNEUMONIAE SUSCEPTIBILITIES PERFORMED ON PREVIOUS CULTURE WITHIN THE LAST 5 DAYS. Performed at [facility]    Report Status 01/10/2015 FINAL  Final  Urine culture     Status: None   Collection Time: 01/06/15 10:44 AM  Result Value Ref Range Status   Specimen Description URINE, CATHETERIZED  Final   Special Requests NONE  Final   Culture   Final    NO GROWTH 1 DAY Performed at [facility]    Report Status 01/07/2015 FINAL  Final  Culture, blood (routine x 2)      Status: None   Collection Time: 01/06/15 11:00 AM  Result Value Ref Range Status   Specimen Description BLOOD PORTA CATH  Final   Special Requests BOTTLES DRAWN AEROBIC AND ANAEROBIC 5CC  Final   Culture  Setup Time   Final    GRAM NEGATIVE RODS IN BOTH AEROBIC AND ANAEROBIC BOTTLES CRITICAL RESULT CALLED TO, READ BACK BY AND VERIFIED WITH: [name] RN 2129 01/06/15 [name]    Culture   Final    KLEBSIELLA PNEUMONIAE ESCHERICHIA COLI Performed at [facility]    Report Status 01/09/2015 FINAL  Final   Organism ID, Bacteria KLEBSIELLA PNEUMONIAE  Final   Organism ID, Bacteria ESCHERICHIA COLI  Final      Susceptibility   Escherichia coli - MIC*    AMPICILLIN 8 SENSITIVE Sensitive     CEFAZOLIN <=4 SENSITIVE Sensitive  CEFEPIME <=1 SENSITIVE Sensitive     CEFTAZIDIME <=1 SENSITIVE Sensitive     CEFTRIAXONE <=1 SENSITIVE Sensitive     CIPROFLOXACIN <=0.25 SENSITIVE Sensitive     GENTAMICIN <=1 SENSITIVE Sensitive     IMIPENEM <=0.25 SENSITIVE Sensitive     TRIMETH/SULFA <=20 SENSITIVE Sensitive     AMPICILLIN/SULBACTAM 4 SENSITIVE Sensitive     PIP/TAZO <=4 SENSITIVE Sensitive     * ESCHERICHIA COLI   Klebsiella pneumoniae - MIC*    AMPICILLIN >=32 RESISTANT Resistant     CEFAZOLIN <=4 SENSITIVE Sensitive     CEFEPIME <=1 SENSITIVE Sensitive     CEFTAZIDIME <=1 SENSITIVE Sensitive     CEFTRIAXONE <=1 SENSITIVE Sensitive     CIPROFLOXACIN <=0.25 SENSITIVE Sensitive     GENTAMICIN <=1 SENSITIVE Sensitive     IMIPENEM <=0.25 SENSITIVE Sensitive     TRIMETH/SULFA <=20 SENSITIVE Sensitive     AMPICILLIN/SULBACTAM 4 SENSITIVE Sensitive     PIP/TAZO <=4 SENSITIVE Sensitive     * KLEBSIELLA PNEUMONIAE  MRSA PCR Screening     Status: None   Collection Time: 01/06/15  4:38 PM  Result Value Ref Range Status   MRSA by PCR NEGATIVE NEGATIVE Final    Comment:        The GeneXpert MRSA Assay (FDA approved for NASAL specimens only), is one component of a comprehensive  MRSA colonization surveillance program. It is not intended to diagnose MRSA infection nor to guide or monitor treatment for MRSA infections.   Culture, body fluid-bottle     Status: None   Collection Time: 01/06/15  7:12 PM  Result Value Ref Range Status   Specimen Description FLUID BILE  Final   Special Requests RUQ  Final   Gram Stain   Final    GRAM VARIABLE ROD IN BOTH AEROBIC AND ANAEROBIC BOTTLES CRITICAL RESULT CALLED TO, READ BACK BY AND VERIFIED WITH: BARHAM @0509  01/07/15 [name]    Culture   Final    ENTEROBACTER CLOACAE MODIFIED HODGE TEST=POSITIVE CRITICAL RESULT CALLED TO, READ BACK BY AND VERIFIED WITH: Orville Govern 01/11/15 @ 1053 M VESTAL Performed at [facility]    Report Status 01/11/2015 FINAL  Final   Organism ID, Bacteria ENTEROBACTER CLOACAE  Final      Susceptibility   Enterobacter cloacae - MIC*    CEFAZOLIN >=64 RESISTANT Resistant     CEFEPIME 2 RESISTANT Resistant     CEFTAZIDIME >=64 RESISTANT Resistant     CEFTRIAXONE >=64 RESISTANT Resistant     CIPROFLOXACIN <=0.25 SENSITIVE Sensitive     GENTAMICIN <=1 SENSITIVE Sensitive     IMIPENEM <=0.25 RESISTANT Resistant     TRIMETH/SULFA <=20 SENSITIVE Sensitive     PIP/TAZO >=128 RESISTANT Resistant     * ENTEROBACTER CLOACAE  Gram stain     Status: None   Collection Time: 01/06/15  7:12 PM  Result Value Ref Range Status   Specimen Description FLUID BILE  Final   Special Requests RUQ  Final   Gram Stain   Final    CYTOSPIN SMEAR BUDDING YEAST SEEN GRAM VARIABLE ROD GRAM POSITIVE COCCI IN CLUSTERS CRITICAL RESULT CALLED TO, READ BACK BY AND VERIFIED WITH: [name] @0235  01/07/15 [name]    Report Status 01/07/2015 FINAL  Final  Culture, blood (routine x 2)     Status: None   Collection Time: 01/09/15 12:20 PM  Result Value Ref Range Status   Specimen Description BLOOD RIGHT ARM  Final   Special Requests IN PEDIATRIC BOTTLE  5CC  Final   Culture   Final    NO GROWTH 5 DAYS Performed  at [facility]    Report Status 01/14/2015 FINAL  Final  Culture, blood (routine x 2)     Status: None   Collection Time: 01/09/15 12:34 PM  Result Value Ref Range Status   Specimen Description BLOOD RIGHT ARM  Final   Special Requests BOTTLES DRAWN AEROBIC AND ANAEROBIC 10CC  Final   Culture   Final    NO GROWTH 5 DAYS Performed at [facility]    Report Status 01/14/2015 FINAL  Final  Blood culture (routine single)     Status: None   Collection Time: 01/20/15  3:21 PM  Result Value Ref Range Status   Blood Culture, Routine Culture, Blood  Final    Comment: Final - ===== FINAL REPORT =====NO GROWTH 5 DAYS  Urine Culture     Status: None   Collection Time: 01/20/15  3:21 PM  Result Value Ref Range Status   Urine Culture, Routine Culture, Urine  Final    Comment: Final - ===== COLONY COUNT: ===== 60,000 COLONIES/ML ESCHERICHIA COLI  ------------------------------------------------------------------------  ESCHERICHIA COLI     AMPICILLIN                       MIC      Resistant       >=32 ug/ml    AMOX/CLAVULANIC                  MIC      Indeterminate     16 ug/ml    AMPICILLIN/SUL                   MIC      Resistant       >=32 ug/ml    PIPERACILLIN/TAZO                MIC      Resistant      >=128 ug/ml    IMIPENEM                         MIC      Sensitive     <=0.25 ug/ml    CEFAZOLIN                        MIC      Resistant          8 ug/ml    CEFTRIAXONE                      MIC      Sensitive        <=1 ug/ml    CEFTAZIDIME                      MIC      Sensitive        <=1 ug/ml    CEFEPIME                         MIC      Sensitive        <=1 ug/ml    GENTAMICIN                       MIC      Sensitive        <=  1 ug/ml    TOBRAMYCIN                       MIC      Sensitive        <=1 ug/ml    CIPROFLOXACIN                     MIC      Resistant        >=4 ug/ml    LEVOFLOXACIN                     MIC      Resistant        >=8 ug/ml    NITROFURANTOIN                    MIC      Sensitive       <=16 ug/ml    TRIMETH/SULFA                    MIC      Resistant      >=320 ug/ml  ------------------------------------------------------------------------ NR=NOT REPORTABLE,SEE COMMENT ORAL therapy:A cefazolin MIC of <32 predicts  susceptibility to the oral agents cefaclor, cefdinir,cefpodoxime,cefprozil,cefuroxime, cephalexin,and loracarbef when used for therapy  of uncomplicated UTIs due to E.coli,K.pneumomiae, and P.mirabilis. PARENTERAL therapy: A cefazolin MIC of >8 indicates resistance to parenteral cefazolin. An alternate test method must be performed to confirm susceptibility to parenteral cefazolin. END OF REPORT   Blood culture (routine single)     Status: None   Collection Time: 01/20/15  3:22 PM  Result Value Ref Range Status   Blood Culture, Routine Culture, Blood  Final    Comment: ------------------------------------------------------------------------Gram Stain Report Called to,Read Back Byand Verified With:ROSALYND W @ 9:39 AM10/28/16 BY DWEEKSFinal - ===== FINAL REPORT =====KLEBSIELLA  PNEUMONIAE------------------------------------------------------------------------ KLEBSIELLA PNEUMONIAE   AMPICILLIN                       MIC      Resistant       >=32 ug/ml   AMOX/CLAVULANIC                  MIC      Sensitive        <=2 ug/ml    AMPICILLIN/SUL                   MIC      Sensitive          4 ug/ml   PIPERACILLIN/TAZO                MIC      Sensitive          8 ug/ml   IMIPENEM                         MIC      Sensitive     <=0.25 ug/ml   CEFAZOLIN                        MIC       NR               <=4 ug/ml   CEFTRIAXONE                      MIC      Sensitive        <=  1 ug/ml   CEFTAZIDIME                      MIC      Sensitive        <=1 ug/ml   CEFEPIME                         MIC      Sensitive        <=1 ug/ml   GENTAMICIN                        MI C      Sensitive        <=1 ug/ml   TOBRAMYCIN                       MIC       Sensitive        <=1 ug/ml   CIPROFLOXACIN                    MIC      Indeterminate        ug/ml   LEVOFLOXACIN                     MIC       Indeterminate      4 ug/ml   TRIMETH/SULFA                    MIC      Sensitive         40 ug/ml------------------------------------------------------------------------NR=NOT REPORTABLE,SEE COMMENTORAL therapy:A cefazolin MIC of <32 predicts  susceptibility to the oral agents cefaclor,cefdinir,cefpodoxime,cefprozil,cefuroxime,cephalexin,and loracarbef when used for therapy of uncomplicated UTIs due to E.coli,K.pneumomiae,and P.mirabilis. PARENTERAL therapy: A cefazolinMIC of >8 indicates  resistance to parenteralcefazolin. An alternate test method must beperformed to confirm susceptibility to parenteralcefazolin.END OF REPORT   Urine culture     Status: None   Collection Time: 01/20/15  7:30 PM  Result Value Ref Range Status   Specimen Description URINE, RANDOM  Final   Special Requests NONE  Final   Culture   Final    9,000 COLONIES/mL INSIGNIFICANT GROWTH Performed at [facility]    Report Status 01/22/2015 FINAL  Final  Culture, blood (routine x 2)     Status: None (Preliminary result)   Collection Time: 01/25/15  6:49 PM  Result Value Ref Range Status   Specimen Description BLOOD RIGHT HAND  Final   Special Requests IN PEDIATRIC BOTTLE 3CC  Final   Culture   Final    NO GROWTH 2 DAYS Performed at [facility]    Report Status PENDING  Incomplete  Culture, blood (routine x 2)     Status: None (Preliminary result)   Collection Time: 01/25/15  6:49 PM  Result Value Ref Range Status   Specimen Description BLOOD LEFT ANTECUBITAL  Final   Special Requests BOTTLES DRAWN AEROBIC AND ANAEROBIC 10CC EA  Final   Culture PENDING  Incomplete   Report Status PENDING  Incomplete     Impression/Recommendation  Active Problems:   Gastric cancer ([place])   Anemia, iron deficiency   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 female with  Metastatic stomach cancer with biliary obstruction sp biliary  drain, several admissions with septic shock in mid October due to polymicrobial bactereimia with E coli and Klebsiella PNA. 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 She was dc on cipro and now readmitted with septic picture  #1 Sepsis:  Difficult picture in trying to pin down what specific pathogen is responsible for her current presentation.  The CRE in biliary drain is not at all a clear pathogen here as the drain is going to be colonized with bacteria and the culture from October 12ths is nota compromised by it coming from a plastic drain.  She also DID receive antibiotics that would be effective vs that organism in form of cipro and more recently AVYCAZ.  The organism in the urine is NOT a pathogen and was not even recoverable 4 hours later. She also lacked ANY urinary ssx so not c/w UTI so we can ignore it  I WILL take the blood culture organisms seriously though it is difficult to pin as the culprits since in only 1/2 cultures so could  Be a contaminant  For now I will narrow to IV Cheree Ditto which will cover the organism in the blood  I agree with CT abdomen to see if there are any new fluid collections it he biliary space, abdomen that need to be drained.  IF so would be very helpful to have IR obtain a fresh culture from such a spot via IR drainage (but obviously not helpful via her existing drain from diagnositic standpoint since cx through existing drain compromised  I have explained to the pt that as long as her mechanical obstructive pathology persists she is going to be set up for recurrent infections from this biliary space and I am not likely going to be able to sterilize this space and recurrent infections are likely  #2 Screening:pt was screened for  HIV in July     01/27/2015, 3:21 PM   Thank you so much for this interesting consult  [place] [facility] [place] [phone] (pager) [phone] (office) 01/27/2015, 3:21 PM  [place] 01/27/2015, 3:21 PM

## 2015-01-28 ENCOUNTER — Inpatient Hospital Stay (HOSPITAL_COMMUNITY): Payer: 59

## 2015-01-28 DIAGNOSIS — H04123 Dry eye syndrome of bilateral lacrimal glands: Secondary | ICD-10-CM | POA: Insufficient documentation

## 2015-01-28 LAB — CBC
HEMATOCRIT: 22.4 % — AB (ref 36.0–46.0)
HEMOGLOBIN: 7.5 g/dL — AB (ref 12.0–15.0)
MCH: 27.5 pg (ref 26.0–34.0)
MCHC: 33.5 g/dL (ref 30.0–36.0)
MCV: 82.1 fL (ref 78.0–100.0)
Platelets: 158 10*3/uL (ref 150–400)
RBC: 2.73 MIL/uL — AB (ref 3.87–5.11)
RDW: 16.8 % — ABNORMAL HIGH (ref 11.5–15.5)
WBC: 6.1 10*3/uL (ref 4.0–10.5)

## 2015-01-28 LAB — COMPREHENSIVE METABOLIC PANEL
ALBUMIN: 2.2 g/dL — AB (ref 3.5–5.0)
ALK PHOS: 67 U/L (ref 38–126)
ALT: 10 U/L — ABNORMAL LOW (ref 14–54)
AST: 20 U/L (ref 15–41)
Anion gap: 5 (ref 5–15)
BILIRUBIN TOTAL: 0.8 mg/dL (ref 0.3–1.2)
CALCIUM: 8.1 mg/dL — AB (ref 8.9–10.3)
CO2: 26 mmol/L (ref 22–32)
CREATININE: 1.15 mg/dL — AB (ref 0.44–1.00)
Chloride: 108 mmol/L (ref 101–111)
GFR calc Af Amer: >60 mL/min (ref >60)
GFR calc non Af Amer: 58 mL/min — ABNORMAL LOW (ref >60)
Glucose, Bld: 88 mg/dL (ref 65–99)
Potassium: 3.1 mmol/L — ABNORMAL LOW (ref 3.5–5.1)
SODIUM: 139 mmol/L (ref 135–145)
Total Protein: 6.5 g/dL (ref 6.5–8.1)

## 2015-01-28 MED ORDER — POTASSIUM CHLORIDE CRYS ER 20 MEQ PO TBCR
40.0000 meq | EXTENDED_RELEASE_TABLET | Freq: Once | ORAL | Status: AC
  Administered 2015-01-28: 40 meq via ORAL
  Filled 2015-01-28: qty 2

## 2015-01-28 MED ORDER — ADULT MULTIVITAMIN W/MINERALS CH
1.0000 | ORAL_TABLET | Freq: Every day | ORAL | Status: DC
  Administered 2015-01-28 – 2015-02-05 (×8): 1 via ORAL
  Filled 2015-01-28 (×10): qty 1

## 2015-01-28 MED ORDER — TRAZODONE HCL 50 MG PO TABS
50.0000 mg | ORAL_TABLET | Freq: Every evening | ORAL | Status: DC | PRN
  Administered 2015-01-29 – 2015-02-04 (×5): 50 mg via ORAL
  Filled 2015-01-28 (×5): qty 1

## 2015-01-28 MED ORDER — OXYCODONE HCL 5 MG/5ML PO SOLN
10.0000 mg | ORAL | Status: DC | PRN
  Administered 2015-01-28: 10 mg via ORAL
  Administered 2015-01-28: 20 mg via ORAL
  Administered 2015-01-29 (×2): 10 mg via ORAL
  Administered 2015-01-29: 20 mg via ORAL
  Administered 2015-01-30 – 2015-02-01 (×5): 10 mg via ORAL
  Administered 2015-02-01 – 2015-02-02 (×4): 20 mg via ORAL
  Administered 2015-02-02 – 2015-02-04 (×4): 10 mg via ORAL
  Filled 2015-01-28 (×2): qty 10
  Filled 2015-01-28: qty 20
  Filled 2015-01-28 (×4): qty 10
  Filled 2015-01-28 (×2): qty 20
  Filled 2015-01-28 (×4): qty 10
  Filled 2015-01-28: qty 20
  Filled 2015-01-28: qty 10
  Filled 2015-01-28: qty 20
  Filled 2015-01-28: qty 10
  Filled 2015-01-28 (×2): qty 20

## 2015-01-28 MED ORDER — PRO-STAT SUGAR FREE PO LIQD
30.0000 mL | Freq: Three times a day (TID) | ORAL | Status: DC
  Administered 2015-01-28 – 2015-02-05 (×17): 30 mL via ORAL
  Filled 2015-01-28 (×26): qty 30

## 2015-01-28 MED ORDER — BOOST PLUS PO LIQD
237.0000 mL | Freq: Three times a day (TID) | ORAL | Status: DC
  Administered 2015-01-28 – 2015-02-05 (×11): 237 mL via ORAL
  Filled 2015-01-28 (×25): qty 237

## 2015-01-28 NOTE — Progress Notes (Signed)
[facility] PROGRESS NOTE  Andrea Best HER:[id number] DOB: Mar 17, 1971 DOA: 01/20/2015 PCP: Milagros Evener, MD Brief history: 44 year old lady with h/o gastric cancer s/p partial gastrectomy with metastatic disease, with last chemo therapy in the first week of October, and recent external biliary drain placement for obstruction on 9/29, presented to ED in the first week of October for sepsis. During the hospitalization she grew klebsiella and E coli in the blood and Enterobacter species ( CRE) from the biliary drain  (cultures were drawn from the biliary drain by IR). All of the bacteria were sensitive to ciprofloxacin and she was discharged on oral ciprofloxacin on 10/17 to complete the course.  She presents to cancer center on 10/26 for sepsis and acute renal failure from dehydration and this time her urine cultures grew 60,000 colonies resistant to ciprofloxacin, but sensitive to Augmentin and one blood culture growing klebsiella ,pan sensitive.   But because of her CRE, Dr.Akula requested Dr Linus Salmons for recommendations, started her on Avycaz. She has been on Avycaz from 10/28. ID Dr Tommy Medal consulted. Meanwhile during her hospitalization she has vomited at least once a day. She attributed it to having a full stomach.  Dr Learta Codding recommended upper GI series, unremarkable CT abd pending  Assessment/Plan: Sepsis  -improving, was febrile, hypotensive and tachycardic on arrival to cancer center,  -Lactic acid elevated.  Repeat lactic acid normalized after fluid boluses.  -Blood cultures 10/26 grew klebsiella 1/2 . Urine cultures grew 60,000 e coli resistent to ciprofloxacin.  -repeat Blood Cx 10/31: negative -Dr.Akula d/w Dr Linus Salmons recommended Zadie Rhine, dosing per pharmacy. -Dr.VanDam following now and started on IV Unasyn 10/3,  CT abd pelvis when Barium out of system-Kub to check for residual Barium, suspect Biliary source -last admission with e coli and klebsiella bacteremia and  Enterobacter in biliary fluid  Metastatic gastric Ca -Chemo on hold -followed by Dr.Sherril  Malignant biliary obstruction  -s/p I/E biliary drain 11/19/14 with last exchange on 12/24/14; recent biliary sepsis -IR following and drain felt to be functioning well -drain care, FU with IR to make decision regarding biliary stent  Nausea/Vomiting, Abd pain -due to above, supportive care, improving -CT as discussed above -upper Gi series unremarkable  Anemia of chronic disease and chemo -stable, monitor -he is jehovah's witness and will not receive blood products.   Sjogren's disease: plaquenil on hold.   Acute renal failure: -possibly from the hypotension and decreased renal perfusion. Improving with hydration. Cut down IVF.   Asthma; -no wheezing  DVT proph: lovenox  Code Status: full code. Family Communication: no family at bedside.  Disposition Plan: home when stable   Consultants:  Oncology Dr Benay Spice.  ID Dr Tommy Medal  IR  Procedures: None  Antibiotics:  avycaz 10/28 till date.    vancomycin and zosyn -10/26 till 10/27  IV cipro on 10/27 to 10/28.    HPI/Subjective: Feels better, less abd pain, no vomiting  Objective: Filed Vitals:   01/28/15 1336  BP: 112/73  Pulse: 107  Temp: 98.6 F (37 C)  Resp: 20    Intake/Output Summary (Last 24 hours) at 01/28/15 1641 Last data filed at 01/28/15 1603  Gross per 24 hour  Intake   2065 ml  Output   2715 ml  Net   -650 ml   Filed Weights   01/20/15 1744  Weight: 60.056 kg (132 lb 6.4 oz)    Exam:   General:  Alert afebrile comfortable.   Cardiovascular: s1s2  Respiratory: ctab,  Abdomen: soft nt ND BS+, biliary drain  Musculoskeletal: no pedal edema.   Data Reviewed: Basic Metabolic Panel:  Recent Labs Lab 01/23/15 0510 01/24/15 0401 01/25/15 0333 01/26/15 0305 01/27/15 0425 01/28/15 0350  NA 139 138 137 139  --  139  K 4.6 3.6 3.6 3.5  --  3.1*  CL 112* 111 111 111  --   108  CO2 22 21* 23 24  --  26  GLUCOSE 82 96 88 97  --  88  BUN 7 6 <5* <5*  --  <5*  CREATININE 1.57* 1.47* 1.20* 1.17* 1.11* 1.15*  CALCIUM 8.2* 8.5* 8.2* 8.2*  --  8.1*   Liver Function Tests:  Recent Labs Lab 01/23/15 0510 01/28/15 0350  AST 39 20  ALT 13* 10*  ALKPHOS 80 67  BILITOT 1.2 0.8  PROT 6.4* 6.5  ALBUMIN 2.0* 2.2*   No results for input(s): LIPASE, AMYLASE in the last 168 hours. No results for input(s): AMMONIA in the last 168 hours. CBC:  Recent Labs Lab 01/24/15 0401 01/28/15 0350  WBC 6.1 6.1  HGB 8.0* 7.5*  HCT 24.7* 22.4*  MCV 81.0 82.1  PLT 148* 158   Cardiac Enzymes: No results for input(s): CKTOTAL, CKMB, CKMBINDEX, TROPONINI in the last 168 hours. BNP (last 3 results)  Recent Labs  01/06/15 1450  BNP 108.2*    ProBNP (last 3 results) No results for input(s): PROBNP in the last 8760 hours.  CBG: No results for input(s): GLUCAP in the last 168 hours.  Recent Results (from the past 240 hour(s))  Blood culture (routine single)     Status: None   Collection Time: 01/20/15  3:21 PM  Result Value Ref Range Status   Blood Culture, Routine Culture, Blood  Final    Comment: Final - ===== FINAL REPORT =====NO GROWTH 5 DAYS  Urine Culture     Status: None   Collection Time: 01/20/15  3:21 PM  Result Value Ref Range Status   Urine Culture, Routine Culture, Urine  Final    Comment: Final - ===== COLONY COUNT: ===== 60,000 COLONIES/ML ESCHERICHIA COLI  ------------------------------------------------------------------------  ESCHERICHIA COLI     AMPICILLIN                       MIC      Resistant       >=32 ug/ml    AMOX/CLAVULANIC                  MIC      Indeterminate     16 ug/ml    AMPICILLIN/SUL                   MIC      Resistant       >=32 ug/ml    PIPERACILLIN/TAZO                MIC      Resistant      >=128 ug/ml    IMIPENEM                         MIC      Sensitive     <=0.25 ug/ml    CEFAZOLIN                        MIC       Resistant          8  ug/ml    CEFTRIAXONE                      MIC      Sensitive        <=1 ug/ml    CEFTAZIDIME                      MIC      Sensitive        <=1 ug/ml    CEFEPIME                         MIC      Sensitive        <=1 ug/ml    GENTAMICIN                       MIC      Sensitive        <=1 ug/ml    TOBRAMYCIN                       MIC      Sensitive        <=1 ug/ml    CIPROFLOXACIN                     MIC      Resistant        >=4 ug/ml    LEVOFLOXACIN                     MIC      Resistant        >=8 ug/ml    NITROFURANTOIN                   MIC      Sensitive       <=16 ug/ml    TRIMETH/SULFA                    MIC      Resistant      >=320 ug/ml  ------------------------------------------------------------------------ NR=NOT REPORTABLE,SEE COMMENT ORAL therapy:A cefazolin MIC of <32 predicts  susceptibility to the oral agents cefaclor, cefdinir,cefpodoxime,cefprozil,cefuroxime, cephalexin,and loracarbef when used for therapy  of uncomplicated UTIs due to E.coli,K.pneumomiae, and P.mirabilis. PARENTERAL therapy: A cefazolin MIC of >8 indicates resistance to parenteral cefazolin. An alternate test method must be performed to confirm susceptibility to parenteral cefazolin. END OF REPORT   Blood culture (routine single)     Status: None   Collection Time: 01/20/15  3:22 PM  Result Value Ref Range Status   Blood Culture, Routine Culture, Blood  Final    Comment: ------------------------------------------------------------------------Gram Stain Report Called to,Read Back Byand Verified With:ROSALYND W @ 9:39 AM10/28/16 BY DWEEKSFinal - ===== FINAL REPORT =====KLEBSIELLA  PNEUMONIAE------------------------------------------------------------------------ KLEBSIELLA PNEUMONIAE   AMPICILLIN                       MIC      Resistant       >=32 ug/ml   AMOX/CLAVULANIC                  MIC      Sensitive        <=2 ug/ml    AMPICILLIN/SUL                   MIC       Sensitive  4 ug/ml   PIPERACILLIN/TAZO                MIC      Sensitive          8 ug/ml   IMIPENEM                         MIC      Sensitive     <=0.25 ug/ml   CEFAZOLIN                        MIC       NR               <=4 ug/ml   CEFTRIAXONE                      MIC      Sensitive        <=1 ug/ml   CEFTAZIDIME                      MIC      Sensitive        <=1 ug/ml   CEFEPIME                         MIC      Sensitive        <=1 ug/ml   GENTAMICIN                        MI C      Sensitive        <=1 ug/ml   TOBRAMYCIN                       MIC      Sensitive        <=1 ug/ml   CIPROFLOXACIN                    MIC      Indeterminate        ug/ml   LEVOFLOXACIN                     MIC       Indeterminate      4 ug/ml   TRIMETH/SULFA                    MIC      Sensitive         40 ug/ml------------------------------------------------------------------------NR=NOT REPORTABLE,SEE COMMENTORAL therapy:A cefazolin MIC of <32 predicts  susceptibility to the oral agents cefaclor,cefdinir,cefpodoxime,cefprozil,cefuroxime,cephalexin,and loracarbef when used for therapy of uncomplicated UTIs due to E.coli,K.pneumomiae,and P.mirabilis. PARENTERAL therapy: A cefazolinMIC of >8 indicates  resistance to parenteralcefazolin. An alternate test method must beperformed to confirm susceptibility to parenteralcefazolin.END OF REPORT   Urine culture     Status: None   Collection Time: 01/20/15  7:30 PM  Result Value Ref Range Status   Specimen Description URINE, RANDOM  Final   Special Requests NONE  Final   Culture   Final    9,000 COLONIES/mL INSIGNIFICANT GROWTH Performed at [facility]    Report Status 01/22/2015 FINAL  Final  Culture, blood (routine x 2)     Status: None (Preliminary result)   Collection Time: 01/25/15  6:49 PM  Result Value Ref Range Status   Specimen Description BLOOD RIGHT  HAND  Final   Special Requests IN PEDIATRIC BOTTLE 3CC  Final   Culture   Final    NO GROWTH 3  DAYS Performed at [facility]    Report Status PENDING  Incomplete  Culture, blood (routine x 2)     Status: None (Preliminary result)   Collection Time: 01/25/15  6:49 PM  Result Value Ref Range Status   Specimen Description BLOOD LEFT ANTECUBITAL  Final   Special Requests BOTTLES DRAWN AEROBIC AND ANAEROBIC 10CC EA  Final   Culture   Final    NO GROWTH 3 DAYS Performed at [facility]    Report Status PENDING  Incomplete     Studies: Dg Abd 1 View  01/28/2015  CLINICAL DATA:  Abdominal pain.  Gastric carcinoma. EXAM: ABDOMEN - 1 VIEW COMPARISON:  01/26/2015 FINDINGS: Percutaneous biliary drainage catheter remains in expected position. Surgical clips again seen in the right upper quadrant. No evidence of dilated bowel loops. A moderate amount of residual oral contrast material is seen throughout the majority of the colon. IMPRESSION: Moderate residual oral contrast throughout the colon. Otherwise normal bowel gas pattern. Electronically Signed   By: Earle Gell M.D.   On: 01/28/2015 12:52    Scheduled Meds: . ampicillin-sulbactam (UNASYN) IV  3 g Intravenous Q6H  . enoxaparin (LOVENOX) injection  40 mg Subcutaneous Q24H  . feeding supplement (PRO-STAT SUGAR FREE 64)  30 mL Oral TID BM  . ferrous sulfate  325 mg Oral BID WC  . lactose free nutrition  237 mL Oral TID WC  . morphine  15 mg Oral Q12H  . multivitamin with minerals  1 tablet Oral Daily   Continuous Infusions: . 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

## 2015-01-28 NOTE — Progress Notes (Signed)
IP PROGRESS NOTE  Subjective: She continues to have intermittent nausea and vomiting. She has abdominal and bilateral flank/lower back pain. IV morphine is short lasting.  Objective: Vital signs in last 24 hours: Temp:  [98.2 F (36.8 C)-99.6 F (37.6 C)] 98.4 F (36.9 C) (11/03 0448) Pulse Rate:  [100-101] 101 (11/03 0448) Resp:  [20] 20 (11/03 0448) BP: (106-125)/(65-71) 106/65 mmHg (11/03 0448) SpO2:  [99 %-100 %] 99 % (11/03 0448)  Intake/Output from previous day: 11/02 0701 - 11/03 0700 In: 3233.3 [P.O.:360; I.V.:2403.3; IV Piggyback:350] Out: 2300 [Urine:1800; Drains:500] Intake/Output this shift:     Abdomen soft and nontender. No hepatomegaly. Right upper quadrant drain site with a gauze dressing No leg edema.   Port-A-Cath without erythema.  Lab Results:   Recent Labs  01/28/15 0350  WBC 6.1  HGB 7.5*  HCT 22.4*  PLT 158   BMET  Recent Labs  01/26/15 0305 01/27/15 0425 01/28/15 0350  NA 139  --  139  K 3.5  --  3.1*  CL 111  --  108  CO2 24  --  26  GLUCOSE 97  --  88  BUN <5*  --  <5*  CREATININE 1.17* 1.11* 1.15*  CALCIUM 8.2*  --  8.1*   lactic acid 2.4  Studies/Results: Dg Ugi W/small Bowel  01/26/2015  CLINICAL DATA:  44 year old female with history of gastric cancer post partial gastrectomy. Last chemotherapy first week of October. Persistent vomiting. Subsequent encounter. EXAM: UPPER GI SERIES WITH SMALL BOWEL FOLLOW-THROUGH FLUOROSCOPY TIME:  Fluoroscopy Time 3 minutes and 3 seconds. TECHNIQUE: Single contrast upper GI series using thin barium. Subsequently, serial images of the small bowel were obtained including spot views of the terminal ileum. COMPARISON:  01/06/2015 CT. FINDINGS: Scout view reveals surgical staples left upper quadrant and surgical clips right upper quadrant with biliary drain in place (pigtail portion within the duodenum). No esophageal obstructing lesion. Mild impression by aortic arch. No mucosal abnormality although  double contrast technique was not able to be utilized. Post partial gastrectomy. Gastric remnant without obvious mass although evaluation limited by single contrast technique. Contrast traversed but freely through the gastric remnant into small bowel anastomosis. Slow transit time through small bowel without obstructing lesion identified. IMPRESSION: Post partial gastrectomy. Immediate transit of contrast through gastric remnant into small bowel anastomosis. Slow transit through small bowel without obstructing lesion noted. Single contrast technique had to be utilized secondary to patient's nausea. Electronically Signed   By: Genia Del M.D.   On: 01/26/2015 16:39    Medications: Reviewed  Assessment/Plan: 1. Gastric cancer status post upper endoscopy 09/22/2013 with findings of a partially obstructing oozing cratered gastric ulcer in the gastric antrum.  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   CT scans chest/abdomen/pelvis 10/03/2013 showed a 7 x 5 mm groundglass opacity in the superior segment of the right lower lobe; a 4.5 mm nodular density in the right upper lobe; bilateral axillary adenopathy; low density area anteriorly in the left hepatic lobe most consistent with fatty infiltration; another hypodense area within the medial segment of the left hepatic lobe; severe gastric distention; mild bilateral inguinal adenopathy.   Subtotal gastrectomy and lymph node dissection with creation of a gastrojejunostomy 10/10/2013. No evidence of distant metastatic disease noted at the time of surgery. No evidence of liver metastases. 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 Tumor involved the serosal surface. Resection margins were negative. 10 of 17 lymph nodes were positive for metastatic adenocarcinoma. Her-2  not amplified   Cycle 1 weekly 5-FU/leucovorin 11/14/2013, dose reduced beginning with week 3 secondary to mucositis and  hand/foot syndrome.   Cycle 2 weekly 5-FU/leucovorin beginning 12/19/2013.  Initiation of radiation/Xeloda 01/26/2014. She decided to discontinue further radiation/Xeloda after one fraction due to significant nausea/vomiting.  Cycle 3 weekly 5-FU/leucovorin beginning 02/13/2014  Cycle 4 weekly 5-FU/leucovorin beginning 03/18/2014  Cycle 5 weekly 5-FU/leucovorin beginning 04/28/2014  CTs 06/16/2014 with new enhancing nodular lesions along the ventral peritoneal surface  CT-guided biopsy of an anterior peritoneal lesion on 06/24/2014 confirmed poorly differentiated adenocarcinoma  CTs 09/03/2014 with an increasing confluent enhancing lesion located at the ventral peritoneal surface midline of the anterior abdomen. Lesion inseparable from the posterior aspect of the abdominal wall.  CT abdomen/pelvis 11/18/2014 with significant progression of metastatic disease with enlarging metastasis within the anterior abdominal wall, enlargement of multiple peritoneal implants, interval development of masses within the pancreatic head and caudate lobe of the liver. 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 New complex solid and cystic left adnexal lesion.  Placement of percutaneous internal/external biliary drain 11/19/2014  Cycle 1 FOLFOX 12/09/2014  Cycle 2 FOLFOX 12/23/2014  CT 01/06/2015 with a decrease in the anterior abdominal wall tumor 2. Gastric outlet obstruction secondary to #1. Resolved. 3. Nausea/vomiting secondary to #2. Resolved. 4. Abdominal pain secondary to #1. 5. History of weight loss. 6. Microcytic anemia, likely iron deficiency. She received iron dextran 10/11/2013. 7. Lupus/Sjgren's. 8. Pneumonia during hospitalization July 2015.  9. Abdominal abscesses with body fluid culture 10/20/2013 showing moderate Candida tropicalis. She was discharged home on a 4 week course of fluconazole.   10. Mucositis and hand/foot syndrome following cycle 1-week #3 5-FU/leucovorin, the 5-FU and leucovorin were dose reduced. 11. 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 12. Status post LEEP 11/07/2013. 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 Pap smear 06/08/2014-negative for intraepithelial lesions or malignancy 13. Gram-negative sepsis 01/06/2015-blood cultures positive for Klebsiella/Escherichia coli, culture from biliary drainage positive for Enterobacter 14. New right hydronephrosis on CT 01/06/2015, stable on ultrasound 01/21/2015 15. 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 She will continue MS Contin and I will add oxycodone for breakthrough pain.  She has recurrent biliary sepsis. Infectious disease is following her and recommending antibiotics. She is scheduled for a CT today.  Recommendations: 1. Continue antibiotics as recommended by infectious disease 2. CT abdomen/pelvis 3. Chemotherapy remains on hold 4. Continue MS Contin, and OxyFast for breakthrough pain    LOS: 8 days    Andrea Best, Dominica Severin, ANP/GNP-BC 01/28/2015 9:10 AM

## 2015-01-28 NOTE — Progress Notes (Signed)
[facility]    Subjective: No new complaints   Antibiotics:  Anti-infectives    Start     Dose/Rate Route Frequency Ordered Stop   01/27/15 1700  Ampicillin-Sulbactam (UNASYN) 3 g in sodium chloride 0.9 % 100 mL IVPB     3 g 100 mL/hr over 60 Minutes Intravenous Every 6 hours 01/27/15 1609     01/25/15 2200  ceftazidime-avibactam (AVYCAZ) 2.5 g in dextrose 5 % 50 mL IVPB  Status:  Discontinued     2.5 g 25 mL/hr over 2 Hours Intravenous 3 times per day 01/25/15 1245 01/27/15 1609   01/22/15 1400  ceftazidime-avibactam (AVYCAZ) 1.25 g in dextrose 5 % 50 mL IVPB    Comments:  CRE, pharmacy may adjust   1.25 g 25 mL/hr over 2 Hours Intravenous 3 times per day 01/22/15 1136 01/25/15 1647   01/22/15 1130  ciprofloxacin (CIPRO) IVPB 400 mg  Status:  Discontinued     400 mg 200 mL/hr over 60 Minutes Intravenous 3 times per day 01/22/15 1055 01/22/15 1353   01/21/15 2000  vancomycin (VANCOCIN) IVPB 1000 mg/200 mL premix  Status:  Discontinued     1,000 mg 200 mL/hr over 60 Minutes Intravenous Every 24 hours 01/20/15 1917 01/22/15 1055   01/21/15 0600  piperacillin-tazobactam (ZOSYN) IVPB 3.375 g  Status:  Discontinued     3.375 g 12.5 mL/hr over 240 Minutes Intravenous 3 times per day 01/20/15 1917 01/22/15 1055   01/20/15 1930  vancomycin (VANCOCIN) 1,250 mg in sodium chloride 0.9 % 250 mL IVPB     1,250 mg 166.7 mL/hr over 90 Minutes Intravenous STAT 01/20/15 1917 01/20/15 2131   01/20/15 1930  piperacillin-tazobactam (ZOSYN) IVPB 3.375 g     3.375 g 100 mL/hr over 30 Minutes Intravenous STAT 01/20/15 1917 01/20/15 2031      Medications: Scheduled Meds: . ampicillin-sulbactam (UNASYN) IV  3 g Intravenous Q6H  . enoxaparin (LOVENOX) injection  40 mg Subcutaneous Q24H  . feeding supplement (PRO-STAT SUGAR FREE 64)  30 mL Oral TID BM  . ferrous sulfate  325 mg Oral BID WC  . lactose free nutrition  237 mL Oral TID WC  . morphine   15 mg Oral Q12H  . multivitamin with minerals  1 tablet Oral Daily   Continuous Infusions: . sodium chloride 50 mL/hr at 01/28/15 1212   PRN Meds:.acetaminophen **OR** acetaminophen, LORazepam, morphine injection, ondansetron **OR** ondansetron (ZOFRAN) IV, oxyCODONE, polyvinyl alcohol, promethazine, sodium chloride, traZODone    Objective: Weight change:   Intake/Output Summary (Last 24 hours) at 01/28/15 1515 Last data filed at 01/28/15 1230  Gross per 24 hour  Intake 2293.33 ml  Output   2340 ml  Net -46.67 ml   Blood pressure 112/73, pulse 107, temperature 98.6 F (37 C), temperature source Oral, resp. rate 20, height 5\' 6"  (1.676 m), weight 132 lb 6.4 oz (60.056 kg), last menstrual period 11/23/2014, SpO2 100 %. Temp:  [98.4 F (36.9 C)-99.6 F (37.6 C)] 98.6 F (37 C) (11/03 1336) Pulse Rate:  [100-107] 107 (11/03 1336) Resp:  [20] 20 (11/03 1336) BP: (106-125)/(65-73) 112/73 mmHg (11/03 1336) SpO2:  [99 %-100 %] 100 % (11/03 1336)  Physical Exam: General: Alert and awake, oriented x3, not in any acute distress. HEENT: anicteric sclera, EOMI, oropharynx clear and without exudate Cardiovascular: regular rate, normal r, no murmur rubs or gallops Pulmonary: clear to auscultation bilaterally, no wheezing, rales  or rhonchi Gastrointestinal: soft TTP in RUQ, nondistended, normal bowel sounds, Musculoskeletal: no clubbing or edema noted bilaterally Skin, soft tissue: no rashes Neuro: nonfocal, strength and sensation intact  CBC:  CBC Latest Ref Rng 01/28/2015 01/24/2015 01/21/2015  WBC 4.0 - 10.5 K/uL 6.1 6.1 6.2  Hemoglobin 12.0 - 15.0 g/dL 7.5(L) 8.0(L) 7.1(L)  Hematocrit 36.0 - 46.0 % 22.4(L) 24.7(L) 21.8(L)  Platelets 150 - 400 K/uL 158 148(L) 108(L)      BMET  Recent Labs  01/26/15 0305 01/27/15 0425 01/28/15 0350  NA 139  --  139  K 3.5  --  3.1*  CL 111  --  108  CO2 24  --  26  GLUCOSE 97  --  88  BUN <5*  --  <5*  CREATININE 1.17* 1.11* 1.15*   CALCIUM 8.2*  --  8.1*     Liver Panel   Recent Labs  01/28/15 0350  PROT 6.5  ALBUMIN 2.2*  AST 20  ALT 10*  ALKPHOS 67  BILITOT 0.8       Sedimentation Rate No results for input(s): ESRSEDRATE in the last 72 hours. C-Reactive Protein No results for input(s): CRP in the last 72 hours.  Micro Results: Recent Results (from the past 720 hour(s))  Culture, blood (routine x 2)     Status: None   Collection Time: 01/06/15 10:42 AM  Result Value Ref Range Status   Specimen Description BLOOD LEFT ANTECUBITAL  Final   Special Requests BOTTLES DRAWN AEROBIC AND ANAEROBIC 5ML  Final   Culture  Setup Time   Final    GRAM NEGATIVE RODS IN BOTH AEROBIC AND ANAEROBIC BOTTLES CRITICAL RESULT CALLED TO, READ BACK BY AND VERIFIED WITH: [name] @0235  01/07/15 [name]    Culture   Final    KLEBSIELLA PNEUMONIAE SUSCEPTIBILITIES PERFORMED ON PREVIOUS CULTURE WITHIN THE LAST 5 DAYS. Performed at [facility]    Report Status 01/10/2015 FINAL  Final  Urine culture     Status: None   Collection Time: 01/06/15 10:44 AM  Result Value Ref Range Status   Specimen Description URINE, CATHETERIZED  Final   Special Requests NONE  Final   Culture   Final    NO GROWTH 1 DAY Performed at [facility]    Report Status 01/07/2015 FINAL  Final  Culture, blood (routine x 2)     Status: None   Collection Time: 01/06/15 11:00 AM  Result Value Ref Range Status   Specimen Description BLOOD PORTA CATH  Final   Special Requests BOTTLES DRAWN AEROBIC AND ANAEROBIC 5CC  Final   Culture  Setup Time   Final    GRAM NEGATIVE RODS IN BOTH AEROBIC AND ANAEROBIC BOTTLES CRITICAL RESULT CALLED TO, READ BACK BY AND VERIFIED WITH: [name] RN 2129 01/06/15 [name]    Culture   Final    KLEBSIELLA PNEUMONIAE ESCHERICHIA COLI Performed at [facility]    Report Status 01/09/2015 FINAL  Final   Organism ID, Bacteria KLEBSIELLA PNEUMONIAE  Final   Organism ID, Bacteria  ESCHERICHIA COLI  Final      Susceptibility   Escherichia coli - MIC*    AMPICILLIN 8 SENSITIVE Sensitive     CEFAZOLIN <=4 SENSITIVE Sensitive     CEFEPIME <=1 SENSITIVE Sensitive     CEFTAZIDIME <=1 SENSITIVE Sensitive     CEFTRIAXONE <=1 SENSITIVE Sensitive     CIPROFLOXACIN <=0.25 SENSITIVE Sensitive     GENTAMICIN <=1 SENSITIVE Sensitive  IMIPENEM <=0.25 SENSITIVE Sensitive     TRIMETH/SULFA <=20 SENSITIVE Sensitive     AMPICILLIN/SULBACTAM 4 SENSITIVE Sensitive     PIP/TAZO <=4 SENSITIVE Sensitive     * ESCHERICHIA COLI   Klebsiella pneumoniae - MIC*    AMPICILLIN >=32 RESISTANT Resistant     CEFAZOLIN <=4 SENSITIVE Sensitive     CEFEPIME <=1 SENSITIVE Sensitive     CEFTAZIDIME <=1 SENSITIVE Sensitive     CEFTRIAXONE <=1 SENSITIVE Sensitive     CIPROFLOXACIN <=0.25 SENSITIVE Sensitive     GENTAMICIN <=1 SENSITIVE Sensitive     IMIPENEM <=0.25 SENSITIVE Sensitive     TRIMETH/SULFA <=20 SENSITIVE Sensitive     AMPICILLIN/SULBACTAM 4 SENSITIVE Sensitive     PIP/TAZO <=4 SENSITIVE Sensitive     * KLEBSIELLA PNEUMONIAE  MRSA PCR Screening     Status: None   Collection Time: 01/06/15  4:38 PM  Result Value Ref Range Status   MRSA by PCR NEGATIVE NEGATIVE Final    Comment:        The GeneXpert MRSA Assay (FDA approved for NASAL specimens only), is one component of a comprehensive MRSA colonization surveillance program. It is not intended to diagnose MRSA infection nor to guide or monitor treatment for MRSA infections.   Culture, body fluid-bottle     Status: None   Collection Time: 01/06/15  7:12 PM  Result Value Ref Range Status   Specimen Description FLUID BILE  Final   Special Requests RUQ  Final   Gram Stain   Final    GRAM VARIABLE ROD IN BOTH AEROBIC AND ANAEROBIC BOTTLES CRITICAL RESULT CALLED TO, READ BACK BY AND VERIFIED WITH: BARHAM @0509  01/07/15 [name]    Culture   Final    ENTEROBACTER CLOACAE MODIFIED HODGE TEST=POSITIVE CRITICAL RESULT CALLED  TO, READ BACK BY AND VERIFIED WITH: Orville Govern 01/11/15 @ 1053 M VESTAL Performed at [facility]    Report Status 01/11/2015 FINAL  Final   Organism ID, Bacteria ENTEROBACTER CLOACAE  Final      Susceptibility   Enterobacter cloacae - MIC*    CEFAZOLIN >=64 RESISTANT Resistant     CEFEPIME 2 RESISTANT Resistant     CEFTAZIDIME >=64 RESISTANT Resistant     CEFTRIAXONE >=64 RESISTANT Resistant     CIPROFLOXACIN <=0.25 SENSITIVE Sensitive     GENTAMICIN <=1 SENSITIVE Sensitive     IMIPENEM <=0.25 RESISTANT Resistant     TRIMETH/SULFA <=20 SENSITIVE Sensitive     PIP/TAZO >=128 RESISTANT Resistant     * ENTEROBACTER CLOACAE  Gram stain     Status: None   Collection Time: 01/06/15  7:12 PM  Result Value Ref Range Status   Specimen Description FLUID BILE  Final   Special Requests RUQ  Final   Gram Stain   Final    CYTOSPIN SMEAR BUDDING YEAST SEEN GRAM VARIABLE ROD GRAM POSITIVE COCCI IN CLUSTERS CRITICAL RESULT CALLED TO, READ BACK BY AND VERIFIED WITH: [name] @0235  01/07/15 [name]    Report Status 01/07/2015 FINAL  Final  Culture, blood (routine x 2)     Status: None   Collection Time: 01/09/15 12:20 PM  Result Value Ref Range Status   Specimen Description BLOOD RIGHT ARM  Final   Special Requests IN PEDIATRIC BOTTLE 5CC  Final   Culture   Final    NO GROWTH 5 DAYS Performed at [facility]    Report Status 01/14/2015 FINAL  Final  Culture, blood (routine x 2)  Status: None   Collection Time: 01/09/15 12:34 PM  Result Value Ref Range Status   Specimen Description BLOOD RIGHT ARM  Final   Special Requests BOTTLES DRAWN AEROBIC AND ANAEROBIC 10CC  Final   Culture   Final    NO GROWTH 5 DAYS Performed at [facility]    Report Status 01/14/2015 FINAL  Final  Blood culture (routine single)     Status: None   Collection Time: 01/20/15  3:21 PM  Result Value Ref Range Status   Blood Culture, Routine Culture, Blood  Final    Comment: Final - =====  FINAL REPORT =====NO GROWTH 5 DAYS  Urine Culture     Status: None   Collection Time: 01/20/15  3:21 PM  Result Value Ref Range Status   Urine Culture, Routine Culture, Urine  Final    Comment: Final - ===== COLONY COUNT: ===== 60,000 COLONIES/ML ESCHERICHIA COLI  ------------------------------------------------------------------------  ESCHERICHIA COLI     AMPICILLIN                       MIC      Resistant       >=32 ug/ml    AMOX/CLAVULANIC                  MIC      Indeterminate     16 ug/ml    AMPICILLIN/SUL                   MIC      Resistant       >=32 ug/ml    PIPERACILLIN/TAZO                MIC      Resistant      >=128 ug/ml    IMIPENEM                         MIC      Sensitive     <=0.25 ug/ml    CEFAZOLIN                        MIC      Resistant          8 ug/ml    CEFTRIAXONE                      MIC      Sensitive        <=1 ug/ml    CEFTAZIDIME                      MIC      Sensitive        <=1 ug/ml    CEFEPIME                         MIC      Sensitive        <=1 ug/ml    GENTAMICIN                       MIC      Sensitive        <=1 ug/ml    TOBRAMYCIN                       MIC      Sensitive        <=  1 ug/ml    CIPROFLOXACIN                     MIC      Resistant        >=4 ug/ml    LEVOFLOXACIN                     MIC      Resistant        >=8 ug/ml    NITROFURANTOIN                   MIC      Sensitive       <=16 ug/ml    TRIMETH/SULFA                    MIC      Resistant      >=320 ug/ml  ------------------------------------------------------------------------ NR=NOT REPORTABLE,SEE COMMENT ORAL therapy:A cefazolin MIC of <32 predicts  susceptibility to the oral agents cefaclor, cefdinir,cefpodoxime,cefprozil,cefuroxime, cephalexin,and loracarbef when used for therapy  of uncomplicated UTIs due to E.coli,K.pneumomiae, and P.mirabilis. PARENTERAL therapy: A cefazolin MIC of >8 indicates resistance to parenteral cefazolin. An alternate test method must  be performed to confirm susceptibility to parenteral cefazolin. END OF REPORT   Blood culture (routine single)     Status: None   Collection Time: 01/20/15  3:22 PM  Result Value Ref Range Status   Blood Culture, Routine Culture, Blood  Final    Comment: ------------------------------------------------------------------------Gram Stain Report Called to,Read Back Byand Verified With:ROSALYND W @ 9:39 AM10/28/16 BY DWEEKSFinal - ===== FINAL REPORT =====KLEBSIELLA  PNEUMONIAE------------------------------------------------------------------------ KLEBSIELLA PNEUMONIAE   AMPICILLIN                       MIC      Resistant       >=32 ug/ml   AMOX/CLAVULANIC                  MIC      Sensitive        <=2 ug/ml    AMPICILLIN/SUL                   MIC      Sensitive          4 ug/ml   PIPERACILLIN/TAZO                MIC      Sensitive          8 ug/ml   IMIPENEM                         MIC      Sensitive     <=0.25 ug/ml   CEFAZOLIN                        MIC       NR               <=4 ug/ml   CEFTRIAXONE                      MIC      Sensitive        <=1 ug/ml   CEFTAZIDIME                      MIC      Sensitive        <=  1 ug/ml   CEFEPIME                         MIC      Sensitive        <=1 ug/ml   GENTAMICIN                        MI C      Sensitive        <=1 ug/ml   TOBRAMYCIN                       MIC      Sensitive        <=1 ug/ml   CIPROFLOXACIN                    MIC      Indeterminate        ug/ml   LEVOFLOXACIN                     MIC       Indeterminate      4 ug/ml   TRIMETH/SULFA                    MIC      Sensitive         40 ug/ml------------------------------------------------------------------------NR=NOT REPORTABLE,SEE COMMENTORAL therapy:A cefazolin MIC of <32 predicts  susceptibility to the oral agents cefaclor,cefdinir,cefpodoxime,cefprozil,cefuroxime,cephalexin,and loracarbef when used for therapy of uncomplicated UTIs due to E.coli,K.pneumomiae,and P.mirabilis. PARENTERAL  therapy: A cefazolinMIC of >8 indicates  resistance to parenteralcefazolin. An alternate test method must beperformed to confirm susceptibility to parenteralcefazolin.END OF REPORT   Urine culture     Status: None   Collection Time: 01/20/15  7:30 PM  Result Value Ref Range Status   Specimen Description URINE, RANDOM  Final   Special Requests NONE  Final   Culture   Final    9,000 COLONIES/mL INSIGNIFICANT GROWTH Performed at [facility]    Report Status 01/22/2015 FINAL  Final  Culture, blood (routine x 2)     Status: None (Preliminary result)   Collection Time: 01/25/15  6:49 PM  Result Value Ref Range Status   Specimen Description BLOOD RIGHT HAND  Final   Special Requests IN PEDIATRIC BOTTLE 3CC  Final   Culture   Final    NO GROWTH 3 DAYS Performed at [facility]    Report Status PENDING  Incomplete  Culture, blood (routine x 2)     Status: None (Preliminary result)   Collection Time: 01/25/15  6:49 PM  Result Value Ref Range Status   Specimen Description BLOOD LEFT ANTECUBITAL  Final   Special Requests BOTTLES DRAWN AEROBIC AND ANAEROBIC 10CC EA  Final   Culture   Final    NO GROWTH 3 DAYS Performed at [facility]    Report Status PENDING  Incomplete    Studies/Results: Dg Abd 1 View  01/28/2015  CLINICAL DATA:  Abdominal pain.  Gastric carcinoma. EXAM: ABDOMEN - 1 VIEW COMPARISON:  01/26/2015 FINDINGS: Percutaneous biliary drainage catheter remains in expected position. Surgical clips again seen in the right upper quadrant. No evidence of dilated bowel loops. A moderate amount of residual oral contrast material is seen throughout the majority of the colon. IMPRESSION: Moderate residual oral contrast throughout the colon. Otherwise normal bowel gas pattern. Electronically Signed   By: Sharrie Rothman.D.  On: 01/28/2015 12:52   Dg Ugi W/small Bowel  01/26/2015  CLINICAL DATA:  44 year old female with history of gastric cancer post partial  gastrectomy. Last chemotherapy first week of October. Persistent vomiting. Subsequent encounter. EXAM: UPPER GI SERIES WITH SMALL BOWEL FOLLOW-THROUGH FLUOROSCOPY TIME:  Fluoroscopy Time 3 minutes and 3 seconds. TECHNIQUE: Single contrast upper GI series using thin barium. Subsequently, serial images of the small bowel were obtained including spot views of the terminal ileum. COMPARISON:  01/06/2015 CT. FINDINGS: Scout view reveals surgical staples left upper quadrant and surgical clips right upper quadrant with biliary drain in place (pigtail portion within the duodenum). No esophageal obstructing lesion. Mild impression by aortic arch. No mucosal abnormality although double contrast technique was not able to be utilized. Post partial gastrectomy. Gastric remnant without obvious mass although evaluation limited by single contrast technique. Contrast traversed but freely through the gastric remnant into small bowel anastomosis. Slow transit time through small bowel without obstructing lesion identified. IMPRESSION: Post partial gastrectomy. Immediate transit of contrast through gastric remnant into small bowel anastomosis. Slow transit through small bowel without obstructing lesion noted. Single contrast technique had to be utilized secondary to patient's nausea. Electronically Signed   By: Genia Del M.D.   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 female with  withmetastatic stomach cancer with biliary obstruction sp biliary drain, several admissions with septic shock in mid October due to  polymicrobial bactereimia with E coli and Klebsiella PNA. 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 She was dc on cipro and now readmitted with septic picture  #1 Sepsis:  Difficult picture in trying to pin down what specific pathogen is responsible for her current presentation.  The CRE in biliary drain is not at all a clear pathogen here as the drain is going to be colonized with bacteria and the culture from October 12ths is nota compromised by it coming from a plastic drain.  She also DID receive antibiotics that would be effective vs that organism in form of cipro and more recently AVYCAZ.  The organism in the urine is NOT a pathogen and was not even recoverable 4 hours later. She also lacked ANY urinary ssx so not c/w UTI so we can ignore it  I WILL take the blood culture organisms seriously though it is difficult to pin as the culprits since in only 1/2 cultures so could Be a contaminant  For now I will narrow to IV Cheree Ditto which will cover the organism in the blood  I agree with CT abdomen to see if there are any new fluid collections it he biliary space, abdomen that need to be drained.  IF so would be very helpful to have IR obtain a fresh culture from such a spot via IR drainage (but obviously not helpful via her existing drain from diagnositic standpoint since cx through existing drain compromised  For now continue IV unasyn and I think oral augmentin would be a reasonable oral  antibiotic to keep her on in an attempt (which may be futile) to try to keep infection in her biliary tree at [place].   LOS: 8 days   Alcide Evener 01/28/2015, 3:15 PM

## 2015-01-28 NOTE — Progress Notes (Signed)
Initial Nutrition Assessment  DOCUMENTATION CODES:   Non-severe (moderate) malnutrition in context of chronic illness  INTERVENTION:  - Monitor Mg, K, P for at least 3 days due to Re-Feeding Risk; replete per MD as appropriate.   - Please provide Pro-Stat (30 mL) TID (each supplement contains 100 kcal and 15 gm protein).  - Modify Boost Breeze to Boost Plus BID (each supplement contains 360 kcal and 14 gm protein).  - Order Multi-vitamin.  - Encouraged patient to eat high protein / energy dense foods.  - RD team to continue to monitor for needs.    NUTRITION DIAGNOSIS:   Increased nutrient needs related to catabolic illness as evidenced by estimated needs, percent weight loss.   GOAL:   Patient will meet greater than or equal to 90% of their needs, Weight gain   MONITOR:   PO intake, Supplement acceptance, Labs, Weight trends, Skin  REASON FOR ASSESSMENT:   Malnutrition Screening Tool    ASSESSMENT:   44 y.o. female with h/o metastatic gastric cancer s/p resection , external biliary drain for obstruction on 9/29, recent admission for E COLI and klebsiella sepsis still on ciprofloxacin, presents today for fevers, hypotension, fatigue and vomiting since one week. She presented to cancer center for chemo and was found to be hypotensive, received fluid boluses, but spiked fever, and became tachycardic. Her bp paramters improved but was referred to medical service for admission for sepsis for possible UTI.    Patient has a h/o of s/p gastric resection with gastrojeuosteomy (09/2013), and recent external biliary drain placement for obstruction on 12/24/2014.   Patient was alert and sitting upright during visit for MST, with mother present in room. Patient's breakfast was at bedside, along with snacks from home including: chips and boiled peanuts.  Patient denied abdominal pain, swallowing difficulty, or nausea / vomiting today. She noted recent taste changes upon Oncology diagnosis,  but stated her taste buds are returning to normal. Patient stated that she does have increased sensitivity to smells and this can lead to increased nausea. Patient's appetite has been low since hospitalization and reported being much better PTA. Meal completion is 0-100% (with mother sharing some foods on tray).   Patient and family were concerned about meeting nutrition needs. Patient is also being followed by [facility] dietitian with good compliance.   Patient was recently on TPN during her last admission (01/06/2015 - 01/11/2015). Per chart, TPN was stopped on 01/12/2015 with good oral intake.   RD student encouraged patient to continue eating energy dense, high protein foods in small, frequent meals throughout the day. We also talked about trying cold foods during times when aroma is increasing nausea and resulting in limited PO intake. Discussed with patient supplement acceptance. Patient has good compliance with Boost Breeze, but is willing to try Boost Plus to trail for tolerance. Patient is willing to sample Pro-Stat supplements as well.   Patient described a history of weight loss since oncology diagnosis. Prior, her usual weight was 150 lbs, and has been trending down to 132 lbs. Per chart, patient has experienced a significant weight change (13%/ 3 months).   NFPE performed - patient has mild muscle wasting: clavicles, temple, shoulder/acromion, and mild edema.   Labs reviewed: low Heme (7.5), low K (3.1), low BUN (<5), low Ca (8.1), low Co2 (21), low Alk Phos (13), high Cl (112).   Medications reviewed.    Diet Order:  Diet regular Room service appropriate?: Yes; Fluid consistency:: Thin  Skin:  Reviewed,  no issues  Last BM:  11/1  Height:   Ht Readings from Last 1 Encounters:  01/20/15 5' 6"  (1.676 m)    Weight:   Wt Readings from Last 1 Encounters:  01/20/15 132 lb 6.4 oz (60.056 kg)    Ideal Body Weight:     BMI:  Body mass index is 21.38 kg/(m^2).  Estimated  Nutritional Needs:   Kcal:  7460-0298  Protein:  80-90 gm  Fluid:  1.8-2.1 L/day  EDUCATION NEEDS:   Education needs addressed  Kayleen Memos, Dietetic Intern 01/28/2015 10:22 AM

## 2015-01-29 ENCOUNTER — Inpatient Hospital Stay (HOSPITAL_COMMUNITY): Payer: 59

## 2015-01-29 ENCOUNTER — Telehealth: Payer: Self-pay | Admitting: *Deleted

## 2015-01-29 ENCOUNTER — Telehealth: Payer: Self-pay | Admitting: Oncology

## 2015-01-29 ENCOUNTER — Other Ambulatory Visit: Payer: Self-pay | Admitting: *Deleted

## 2015-01-29 DIAGNOSIS — R1084 Generalized abdominal pain: Secondary | ICD-10-CM | POA: Insufficient documentation

## 2015-01-29 LAB — BASIC METABOLIC PANEL
Anion gap: 7 (ref 5–15)
CHLORIDE: 106 mmol/L (ref 101–111)
CO2: 24 mmol/L (ref 22–32)
CREATININE: 1.1 mg/dL — AB (ref 0.44–1.00)
Calcium: 8.1 mg/dL — ABNORMAL LOW (ref 8.9–10.3)
GFR calc Af Amer: >60 mL/min (ref >60)
GFR calc non Af Amer: >60 mL/min (ref >60)
GLUCOSE: 115 mg/dL — AB (ref 65–99)
Potassium: 3.2 mmol/L — ABNORMAL LOW (ref 3.5–5.1)
Sodium: 137 mmol/L (ref 135–145)

## 2015-01-29 LAB — CBC
HCT: 24.1 % — ABNORMAL LOW (ref 36.0–46.0)
Hemoglobin: 7.9 g/dL — ABNORMAL LOW (ref 12.0–15.0)
MCH: 26.8 pg (ref 26.0–34.0)
MCHC: 32.8 g/dL (ref 30.0–36.0)
MCV: 81.7 fL (ref 78.0–100.0)
PLATELETS: 145 10*3/uL — AB (ref 150–400)
RBC: 2.95 MIL/uL — ABNORMAL LOW (ref 3.87–5.11)
RDW: 17 % — AB (ref 11.5–15.5)
WBC: 6.1 10*3/uL (ref 4.0–10.5)

## 2015-01-29 MED ORDER — PEG 3350-KCL-NA BICARB-NACL 420 G PO SOLR
1000.0000 mL | Freq: Once | ORAL | Status: AC
  Administered 2015-01-29: 1000 mL via ORAL

## 2015-01-29 NOTE — Care Management Note (Signed)
Case Management Note  Patient Details  Name: Andrea Best MRN: [id number] Date of Birth: 17-Oct-1970  Subjective/Objective: ID-iv abx, biliary drain-415 output. [facility] active w/[facility]. [facility] rep Kristen aware & following.Await HHRN,face to face order if needed for drain mgmnt, or meds,disease mgmnt.                   Action/Plan:d/c plan home [facility].   Expected Discharge Date:                  Expected Discharge Plan:  [place]  In-House Referral:     Discharge planning Services  CM Consult  Post Acute Care Choice:  [place] (Active w/[facility]) Choice offered to:     DME Arranged:    DME Agency:     HH Arranged:    HH Agency:     Status of Service:  In process, will continue to follow  Medicare Important Message Given:    Date Medicare IM Given:    Medicare IM give by:    Date Additional Medicare IM Given:    Additional Medicare Important Message give by:     If discussed at [place] of Stay Meetings, dates discussed:    Additional Comments:  Dessa Phi, RN 01/29/2015, 12:51 PM

## 2015-01-29 NOTE — Telephone Encounter (Signed)
Per staff message and POF I have scheduled appts. Advised scheduler of appts. JMW  

## 2015-01-29 NOTE — Progress Notes (Signed)
[facility] PROGRESS NOTE  Andrea Best WEX:[id number] DOB: Apr 30, 1970 DOA: 01/20/2015 PCP: Milagros Evener, MD Brief history: 44 year old lady with h/o gastric cancer s/p partial gastrectomy with metastatic disease, with last chemo therapy in the first week of October, and recent external biliary drain placement for obstruction on 9/29, presented to ED in the first week of October for sepsis. During the hospitalization she grew klebsiella and E coli in the blood and Enterobacter species ( CRE) from the biliary drain  (cultures were drawn from the biliary drain by IR). All of the bacteria were sensitive to ciprofloxacin and she was discharged on oral ciprofloxacin on 10/17 to complete the course.  She presents to cancer center on 10/26 for sepsis and acute renal failure from dehydration and this time her urine cultures grew 60,000 colonies resistant to ciprofloxacin, but sensitive to Augmentin and one blood culture growing klebsiella ,pan sensitive.   But because of her CRE, Dr.Akula requested Dr Linus Salmons for recommendations, started her on Avycaz. She has been on Avycaz from 10/28. ID Dr Tommy Medal consulted. Meanwhile during her hospitalization she has vomited at least once a day. She attributed it to having a full stomach.  Dr Learta Codding recommended upper GI series, unremarkable CT abd pending-pending clearing of barium  Assessment/Plan: Sepsis  -improving, was febrile, hypotensive and tachycardic on arrival to cancer center,  -Lactic acid elevated.  Repeat lactic acid normalized after fluid boluses.  -Blood cultures 10/26 grew klebsiella 1/2 . Urine cultures grew 60,000 e coli resistent to ciprofloxacin.  -repeat Blood Cx 10/31: negative -Dr.Akula d/w Dr Linus Salmons recommended Zadie Rhine, dosing per pharmacy. -Dr.VanDam following now and started on IV Unasyn 10/3,  CT abd pelvis when Barium out of system -last admission with e coli and klebsiella bacteremia and Enterobacter in biliary  fluid -clinically much improved,  -KUB in am to determine timing of CT  Metastatic gastric Ca -Chemo on hold -followed by Dr.Sherril  Malignant biliary obstruction  -s/p I/E biliary drain 11/19/14 with last exchange on 12/24/14; recent biliary sepsis -IR following and drain felt to be functioning well -drain care, FU with IR to make decision regarding biliary stent  Nausea/Vomiting, Abd pain -due to above, supportive care, improving -CT as discussed above -upper Gi series unremarkable  Anemia of chronic disease and chemo -stable, monitor -he is jehovah's witness and will not receive blood products.   Sjogren's disease: plaquenil on hold.   Acute renal failure: -possibly from the hypotension and decreased renal perfusion. Improving with hydration. Cut down IVF.   Asthma; -no wheezing  DVT proph: lovenox  Code Status: full code. Family Communication: no family at bedside.  Disposition Plan: home when stable   Consultants:  Oncology Dr Benay Spice.  ID Dr Tommy Medal  IR  Procedures: None  Antibiotics:  avycaz 10/28 till date.    vancomycin and zosyn -10/26 till 10/27  IV cipro on 10/27 to 10/28.    HPI/Subjective: Feels well, less abd pain, no N/V  Objective: Filed Vitals:   01/28/15 2227  BP: 119/64  Pulse: 104  Temp: 98.2 F (36.8 C)  Resp: 20    Intake/Output Summary (Last 24 hours) at 01/29/15 1248 Last data filed at 01/29/15 0600  Gross per 24 hour  Intake      0 ml  Output   1275 ml  Net  -1275 ml   Filed Weights   01/20/15 1744  Weight: 60.056 kg (132 lb 6.4 oz)    Exam:   General:  Alert afebrile  comfortable.   Cardiovascular: s1s2  Respiratory: ctab,   Abdomen: soft nt ND BS+, biliary drain  Musculoskeletal: no pedal edema.   Data Reviewed: Basic Metabolic Panel:  Recent Labs Lab 01/25/15 0333 01/26/15 0305 01/27/15 0425 01/28/15 0350 01/29/15 0454 01/29/15 0838  NA 137 139  --  139 QUESTIONABLE RESULTS,  RECOMMEND RECOLLECT TO VERIFY 137  K 3.6 3.5  --  3.1* QUESTIONABLE RESULTS, RECOMMEND RECOLLECT TO VERIFY 3.2*  CL 111 111  --  108 QUESTIONABLE RESULTS, RECOMMEND RECOLLECT TO VERIFY 106  CO2 23 24  --  26 QUESTIONABLE RESULTS, RECOMMEND RECOLLECT TO VERIFY 24  GLUCOSE 88 97  --  88 QUESTIONABLE RESULTS, RECOMMEND RECOLLECT TO VERIFY 115*  BUN <5* <5*  --  <5* QUESTIONABLE RESULTS, RECOMMEND RECOLLECT TO VERIFY <5*  CREATININE 1.20* 1.17* 1.11* 1.15* QUESTIONABLE RESULTS, RECOMMEND RECOLLECT TO VERIFY 1.10*  CALCIUM 8.2* 8.2*  --  8.1* QUESTIONABLE RESULTS, RECOMMEND RECOLLECT TO VERIFY 8.1*   Liver Function Tests:  Recent Labs Lab 01/23/15 0510 01/28/15 0350  AST 39 20  ALT 13* 10*  ALKPHOS 80 67  BILITOT 1.2 0.8  PROT 6.4* 6.5  ALBUMIN 2.0* 2.2*   No results for input(s): LIPASE, AMYLASE in the last 168 hours. No results for input(s): AMMONIA in the last 168 hours. CBC:  Recent Labs Lab 01/24/15 0401 01/28/15 0350 01/29/15 0838  WBC 6.1 6.1 6.1  HGB 8.0* 7.5* 7.9*  HCT 24.7* 22.4* 24.1*  MCV 81.0 82.1 81.7  PLT 148* 158 145*   Cardiac Enzymes: No results for input(s): CKTOTAL, CKMB, CKMBINDEX, TROPONINI in the last 168 hours. BNP (last 3 results)  Recent Labs  01/06/15 1450  BNP 108.2*    ProBNP (last 3 results) No results for input(s): PROBNP in the last 8760 hours.  CBG: No results for input(s): GLUCAP in the last 168 hours.  Recent Results (from the past 240 hour(s))  Blood culture (routine single)     Status: None   Collection Time: 01/20/15  3:21 PM  Result Value Ref Range Status   Blood Culture, Routine Culture, Blood  Final    Comment: Final - ===== FINAL REPORT =====NO GROWTH 5 DAYS  Urine Culture     Status: None   Collection Time: 01/20/15  3:21 PM  Result Value Ref Range Status   Urine Culture, Routine Culture, Urine  Final    Comment: Final - ===== COLONY COUNT: ===== 60,000 COLONIES/ML ESCHERICHIA  COLI  ------------------------------------------------------------------------  ESCHERICHIA COLI     AMPICILLIN                       MIC      Resistant       >=32 ug/ml    AMOX/CLAVULANIC                  MIC      Indeterminate     16 ug/ml    AMPICILLIN/SUL                   MIC      Resistant       >=32 ug/ml    PIPERACILLIN/TAZO                MIC      Resistant      >=128 ug/ml    IMIPENEM  MIC      Sensitive     <=0.25 ug/ml    CEFAZOLIN                        MIC      Resistant          8 ug/ml    CEFTRIAXONE                      MIC      Sensitive        <=1 ug/ml    CEFTAZIDIME                      MIC      Sensitive        <=1 ug/ml    CEFEPIME                         MIC      Sensitive        <=1 ug/ml    GENTAMICIN                       MIC      Sensitive        <=1 ug/ml    TOBRAMYCIN                       MIC      Sensitive        <=1 ug/ml    CIPROFLOXACIN                     MIC      Resistant        >=4 ug/ml    LEVOFLOXACIN                     MIC      Resistant        >=8 ug/ml    NITROFURANTOIN                   MIC      Sensitive       <=16 ug/ml    TRIMETH/SULFA                    MIC      Resistant      >=320 ug/ml  ------------------------------------------------------------------------ NR=NOT REPORTABLE,SEE COMMENT ORAL therapy:A cefazolin MIC of <32 predicts  susceptibility to the oral agents cefaclor, cefdinir,cefpodoxime,cefprozil,cefuroxime, cephalexin,and loracarbef when used for therapy  of uncomplicated UTIs due to E.coli,K.pneumomiae, and P.mirabilis. PARENTERAL therapy: A cefazolin MIC of >8 indicates resistance to parenteral cefazolin. An alternate test method must be performed to confirm susceptibility to parenteral cefazolin. END OF REPORT   Blood culture (routine single)     Status: None   Collection Time: 01/20/15  3:22 PM  Result Value Ref Range Status   Blood Culture, Routine Culture, Blood  Final    Comment:  ------------------------------------------------------------------------Gram Stain Report Called to,Read Back Byand Verified With:ROSALYND W @ 9:39 AM10/28/16 BY DWEEKSFinal - ===== FINAL REPORT =====KLEBSIELLA  PNEUMONIAE------------------------------------------------------------------------ KLEBSIELLA PNEUMONIAE   AMPICILLIN                       MIC      Resistant       >=32 ug/ml   AMOX/CLAVULANIC  MIC      Sensitive        <=2 ug/ml    AMPICILLIN/SUL                   MIC      Sensitive          4 ug/ml   PIPERACILLIN/TAZO                MIC      Sensitive          8 ug/ml   IMIPENEM                         MIC      Sensitive     <=0.25 ug/ml   CEFAZOLIN                        MIC       NR               <=4 ug/ml   CEFTRIAXONE                      MIC      Sensitive        <=1 ug/ml   CEFTAZIDIME                      MIC      Sensitive        <=1 ug/ml   CEFEPIME                         MIC      Sensitive        <=1 ug/ml   GENTAMICIN                        MI C      Sensitive        <=1 ug/ml   TOBRAMYCIN                       MIC      Sensitive        <=1 ug/ml   CIPROFLOXACIN                    MIC      Indeterminate        ug/ml   LEVOFLOXACIN                     MIC       Indeterminate      4 ug/ml   TRIMETH/SULFA                    MIC      Sensitive         40 ug/ml------------------------------------------------------------------------NR=NOT REPORTABLE,SEE COMMENTORAL therapy:A cefazolin MIC of <32 predicts  susceptibility to the oral agents cefaclor,cefdinir,cefpodoxime,cefprozil,cefuroxime,cephalexin,and loracarbef when used for therapy of uncomplicated UTIs due to E.coli,K.pneumomiae,and P.mirabilis. PARENTERAL therapy: A cefazolinMIC of >8 indicates  resistance to parenteralcefazolin. An alternate test method must beperformed to confirm susceptibility to parenteralcefazolin.END OF REPORT   Urine culture     Status: None   Collection Time: 01/20/15  7:30 PM  Result Value  Ref Range Status   Specimen Description URINE, RANDOM  Final   Special Requests NONE  Final   Culture   Final  9,000 COLONIES/mL INSIGNIFICANT GROWTH Performed at [facility]    Report Status 01/22/2015 FINAL  Final  Culture, blood (routine x 2)     Status: None (Preliminary result)   Collection Time: 01/25/15  6:49 PM  Result Value Ref Range Status   Specimen Description BLOOD RIGHT HAND  Final   Special Requests IN PEDIATRIC BOTTLE 3CC  Final   Culture   Final    NO GROWTH 3 DAYS Performed at [facility]    Report Status PENDING  Incomplete  Culture, blood (routine x 2)     Status: None (Preliminary result)   Collection Time: 01/25/15  6:49 PM  Result Value Ref Range Status   Specimen Description BLOOD LEFT ANTECUBITAL  Final   Special Requests BOTTLES DRAWN AEROBIC AND ANAEROBIC 10CC EA  Final   Culture   Final    NO GROWTH 3 DAYS Performed at [facility]    Report Status PENDING  Incomplete     Studies: Dg Abd 1 View  01/29/2015  CLINICAL DATA:  44 year old female scheduled to undergo CT scan. Evaluating volume of residual barium. EXAM: ABDOMEN - 1 VIEW COMPARISON:  Prior abdominal radiograph 01/28/2015 FINDINGS: New persistent but decreasing retained barium in the colon. There is been and approximately 50% reduction in the amount of retained barium since the prior study. Well-positioned right internal/ external biliary drain. No evidence of obstruction. No acute osseous abnormality. IMPRESSION: Approximately 50% decrease in retained barium throughout the colon compared to 01/28/2015. There is likely still enough retained barium to result in significant CT image degradation from streak artifact. Recommend repeat abdominal radiograph in 24 or 48 hours. Electronically Signed   By: Jacqulynn Cadet M.D.   On: 01/29/2015 10:45   Dg Abd 1 View  01/28/2015  CLINICAL DATA:  Abdominal pain.  Gastric carcinoma. EXAM: ABDOMEN - 1 VIEW COMPARISON:  01/26/2015  FINDINGS: Percutaneous biliary drainage catheter remains in expected position. Surgical clips again seen in the right upper quadrant. No evidence of dilated bowel loops. A moderate amount of residual oral contrast material is seen throughout the majority of the colon. IMPRESSION: Moderate residual oral contrast throughout the colon. Otherwise normal bowel gas pattern. Electronically Signed   By: Earle Gell M.D.   On: 01/28/2015 12:52    Scheduled Meds: . ampicillin-sulbactam (UNASYN) IV  3 g Intravenous Q6H  . enoxaparin (LOVENOX) injection  40 mg Subcutaneous Q24H  . feeding supplement (PRO-STAT SUGAR FREE 64)  30 mL Oral TID BM  . ferrous sulfate  325 mg Oral BID WC  . lactose free nutrition  237 mL Oral TID WC  . morphine  15 mg Oral Q12H  . multivitamin with minerals  1 tablet Oral Daily   Continuous Infusions: . 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

## 2015-01-29 NOTE — Progress Notes (Signed)
IP PROGRESS NOTE  Subjective: She is tolerating a diet. The abdominal pain is relieved with oxycodone. Objective: Vital signs in last 24 hours: Temp:  [98.2 F (36.8 C)-98.9 F (37.2 C)] 98.9 F (37.2 C) (11/04 1317) Pulse Rate:  [104-114] 114 (11/04 1317) Resp:  [18-20] 18 (11/04 1317) BP: (112-119)/(64-68) 112/68 mmHg (11/04 1317) SpO2:  [100 %] 100 % (11/04 1317)  Intake/Output from previous day: 11/03 0701 - 11/04 0700 In: -  Out: 1915 [Urine:1500; Drains:415] Intake/Output this shift: Total I/O In: 240 [P.O.:240] Out: 350 [Urine:200; Drains:150]   Abdomen soft and nontender. No hepatomegaly. Right upper quadrant drain site with a gauze dressing No leg edema.   Port-A-Cath without erythema.  Lab Results:   Recent Labs  01/28/15 0350 01/29/15 0838  WBC 6.1 6.1  HGB 7.5* 7.9*  HCT 22.4* 24.1*  PLT 158 145*   BMET  Recent Labs  01/29/15 0454 01/29/15 0838  NA QUESTIONABLE RESULTS, RECOMMEND RECOLLECT TO VERIFY 137  K QUESTIONABLE RESULTS, RECOMMEND RECOLLECT TO VERIFY 3.2*  CL QUESTIONABLE RESULTS, RECOMMEND RECOLLECT TO VERIFY 106  CO2 QUESTIONABLE RESULTS, RECOMMEND RECOLLECT TO VERIFY 24  GLUCOSE QUESTIONABLE RESULTS, RECOMMEND RECOLLECT TO VERIFY 115*  BUN QUESTIONABLE RESULTS, RECOMMEND RECOLLECT TO VERIFY <5*  CREATININE QUESTIONABLE RESULTS, RECOMMEND RECOLLECT TO VERIFY 1.10*  CALCIUM QUESTIONABLE RESULTS, RECOMMEND RECOLLECT TO VERIFY 8.1*   lactic acid 2.4  Studies/Results: Dg Abd 1 View  01/29/2015  CLINICAL DATA:  44 year old female scheduled to undergo CT scan. Evaluating volume of residual barium. EXAM: ABDOMEN - 1 VIEW COMPARISON:  Prior abdominal radiograph 01/28/2015 FINDINGS: New persistent but decreasing retained barium in the colon. There is been and approximately 50% reduction in the amount of retained barium since the prior study. Well-positioned right internal/ external biliary drain. No evidence of obstruction. No acute osseous  abnormality. IMPRESSION: Approximately 50% decrease in retained barium throughout the colon compared to 01/28/2015. There is likely still enough retained barium to result in significant CT image degradation from streak artifact. Recommend repeat abdominal radiograph in 24 or 48 hours. Electronically Signed   By: Jacqulynn Cadet M.D.   On: 01/29/2015 10:45   Dg Abd 1 View  01/28/2015  CLINICAL DATA:  Abdominal pain.  Gastric carcinoma. EXAM: ABDOMEN - 1 VIEW COMPARISON:  01/26/2015 FINDINGS: Percutaneous biliary drainage catheter remains in expected position. Surgical clips again seen in the right upper quadrant. No evidence of dilated bowel loops. A moderate amount of residual oral contrast material is seen throughout the majority of the colon. IMPRESSION: Moderate residual oral contrast throughout the colon. Otherwise normal bowel gas pattern. Electronically Signed   By: Earle Gell M.D.   On: 01/28/2015 12:52    Medications: Reviewed  Assessment/Plan: 1. Gastric cancer status post upper endoscopy 09/22/2013 with findings of a partially obstructing oozing cratered gastric ulcer in the gastric antrum.  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   CT scans chest/abdomen/pelvis 10/03/2013 showed a 7 x 5 mm groundglass opacity in the superior segment of the right lower lobe; a 4.5 mm nodular density in the right upper lobe; bilateral axillary adenopathy; low density area anteriorly in the left hepatic lobe most consistent with fatty infiltration; another hypodense area within the medial segment of the left hepatic lobe; severe gastric distention; mild bilateral inguinal adenopathy.   Subtotal gastrectomy and lymph node dissection with creation of a gastrojejunostomy 10/10/2013. No evidence of distant metastatic disease noted at the time of surgery. No evidence of liver metastases. Pathology confirmed an  invasive moderately differentiated adenocarcinoma. Tumor  involved the serosal surface. Resection margins were negative. 10 of 17 lymph nodes were positive for metastatic adenocarcinoma. Her-2 not amplified   Cycle 1 weekly 5-FU/leucovorin 11/14/2013, dose reduced beginning with week 3 secondary to mucositis and hand/foot syndrome.   Cycle 2 weekly 5-FU/leucovorin beginning 12/19/2013.  Initiation of radiation/Xeloda 01/26/2014. She decided to discontinue further radiation/Xeloda after one fraction due to significant nausea/vomiting.  Cycle 3 weekly 5-FU/leucovorin beginning 02/13/2014  Cycle 4 weekly 5-FU/leucovorin beginning 03/18/2014  Cycle 5 weekly 5-FU/leucovorin beginning 04/28/2014  CTs 06/16/2014 with new enhancing nodular lesions along the ventral peritoneal surface  CT-guided biopsy of an anterior peritoneal lesion on 06/24/2014 confirmed poorly differentiated adenocarcinoma  CTs 09/03/2014 with an increasing confluent enhancing lesion located at the ventral peritoneal surface midline of the anterior abdomen. Lesion inseparable from the posterior aspect of the abdominal wall.  CT abdomen/pelvis 11/18/2014 with significant progression of metastatic disease with enlarging metastasis within the anterior abdominal wall, enlargement of multiple peritoneal implants, interval development of masses within the pancreatic head and caudate lobe of the liver. 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 New complex solid and cystic left adnexal lesion.  Placement of percutaneous internal/external biliary drain 11/19/2014  Cycle 1 FOLFOX 12/09/2014  Cycle 2 FOLFOX 12/23/2014  CT 01/06/2015 with a decrease in the anterior abdominal wall tumor 2. Gastric outlet obstruction secondary to #1. Resolved. 3. Nausea/vomiting secondary to #2. Resolved. 4. Abdominal pain secondary to #1. 5. History of weight loss. 6. Microcytic anemia, likely iron deficiency. She  received iron dextran 10/11/2013. 7. Lupus/Sjgren's. 8. Pneumonia during hospitalization July 2015.  9. Abdominal abscesses with body fluid culture 10/20/2013 showing moderate Candida tropicalis. She was discharged home on a 4 week course of fluconazole.  10. Mucositis and hand/foot syndrome following cycle 1-week #3 5-FU/leucovorin, the 5-FU and leucovorin were dose reduced. 11. 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 12. Status post LEEP 11/07/2013. 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 Pap smear 06/08/2014-negative for intraepithelial lesions or malignancy 13. Gram-negative sepsis 01/06/2015-blood cultures positive for Klebsiella/Escherichia coli, culture from biliary drainage positive for Enterobacter 14. New right hydronephrosis on CT 01/06/2015, stable on ultrasound 01/21/2015 15. Admission 01/20/2015 with fever/sepsis, one blood culture positive for Klebsiella pneumoniae, urine culture positive for a resistant Escherichia coli  She appears stable. Plan for a follow-up CT per infectious disease and internal medicine.    Recommendations: 1. Continue antibiotics as recommended by infectious disease 2. CT abdomen/pelvis 3. Outpatient follow-up as scheduled at the Cancer center for 02/02/2015 4. Continue MS Contin, and OxyFast for breakthrough pain 5. Please call oncology as needed over the weekend. I will check on her 02/01/2015 if she remains in the hospital.    LOS: 9 days    [place], Dominica Severin, ANP/GNP-BC 01/29/2015 2:01 PM

## 2015-01-29 NOTE — Telephone Encounter (Signed)
lvm for pt regarding to NOV appt.. °

## 2015-01-29 NOTE — Progress Notes (Signed)
[place] [facility]    Subjective: No new complaints   Antibiotics:  Anti-infectives    Start     Dose/Rate Route Frequency Ordered Stop   01/27/15 1700  Ampicillin-Sulbactam (UNASYN) 3 g in sodium chloride 0.9 % 100 mL IVPB     3 g 100 mL/hr over 60 Minutes Intravenous Every 6 hours 01/27/15 1609     01/25/15 2200  ceftazidime-avibactam (AVYCAZ) 2.5 g in dextrose 5 % 50 mL IVPB  Status:  Discontinued     2.5 g 25 mL/hr over 2 Hours Intravenous 3 times per day 01/25/15 1245 01/27/15 1609   01/22/15 1400  ceftazidime-avibactam (AVYCAZ) 1.25 g in dextrose 5 % 50 mL IVPB    Comments:  CRE, pharmacy may adjust   1.25 g 25 mL/hr over 2 Hours Intravenous 3 times per day 01/22/15 1136 01/25/15 1647   01/22/15 1130  ciprofloxacin (CIPRO) IVPB 400 mg  Status:  Discontinued     400 mg 200 mL/hr over 60 Minutes Intravenous 3 times per day 01/22/15 1055 01/22/15 1353   01/21/15 2000  vancomycin (VANCOCIN) IVPB 1000 mg/200 mL premix  Status:  Discontinued     1,000 mg 200 mL/hr over 60 Minutes Intravenous Every 24 hours 01/20/15 1917 01/22/15 1055   01/21/15 0600  piperacillin-tazobactam (ZOSYN) IVPB 3.375 g  Status:  Discontinued     3.375 g 12.5 mL/hr over 240 Minutes Intravenous 3 times per day 01/20/15 1917 01/22/15 1055   01/20/15 1930  vancomycin (VANCOCIN) 1,250 mg in sodium chloride 0.9 % 250 mL IVPB     1,250 mg 166.7 mL/hr over 90 Minutes Intravenous STAT 01/20/15 1917 01/20/15 2131   01/20/15 1930  piperacillin-tazobactam (ZOSYN) IVPB 3.375 g     3.375 g 100 mL/hr over 30 Minutes Intravenous STAT 01/20/15 1917 01/20/15 2031      Medications: Scheduled Meds: . ampicillin-sulbactam (UNASYN) IV  3 g Intravenous Q6H  . enoxaparin (LOVENOX) injection  40 mg Subcutaneous Q24H  . feeding supplement (PRO-STAT SUGAR FREE 64)  30 mL Oral TID BM  . ferrous sulfate  325 mg Oral BID WC  . lactose free nutrition  237 mL Oral TID WC  . morphine   15 mg Oral Q12H  . multivitamin with minerals  1 tablet Oral Daily   Continuous Infusions: . sodium chloride 50 mL/hr at 01/28/15 1212   PRN Meds:.acetaminophen **OR** acetaminophen, LORazepam, morphine injection, ondansetron **OR** ondansetron (ZOFRAN) IV, oxyCODONE, polyvinyl alcohol, promethazine, sodium chloride, traZODone    Objective: Weight change:   Intake/Output Summary (Last 24 hours) at 01/29/15 1156 Last data filed at 01/29/15 0600  Gross per 24 hour  Intake      0 ml  Output   1575 ml  Net  -1575 ml   Blood pressure 119/64, pulse 104, temperature 98.2 F (36.8 C), temperature source Oral, resp. rate 20, height 5\' 6"  (1.676 m), weight 132 lb 6.4 oz (60.056 kg), last menstrual period 11/23/2014, SpO2 100 %. Temp:  [98.2 F (36.8 C)-98.6 F (37 C)] 98.2 F (36.8 C) (11/03 2227) Pulse Rate:  [104-107] 104 (11/03 2227) Resp:  [20] 20 (11/03 2227) BP: (112-119)/(64-73) 119/64 mmHg (11/03 2227) SpO2:  [100 %] 100 % (11/03 2227)  Physical Exam: General: Alert and awake, oriented x3, not in any acute distress. HEENT: anicteric sclera, EOMI,  Cardiovascular: regular rate, normal r, no murmur rubs or gallops Pulmonary: clear to auscultation bilaterally, no wheezing,  rales or rhonchi Gastrointestinal: , nondistended, normal bowel sounds, Musculoskeletal: no clubbing or edema noted bilaterally Skin, soft tissue: no rashes Neuro: nonfocal  CBC:  CBC Latest Ref Rng 01/29/2015 01/28/2015 01/24/2015  WBC 4.0 - 10.5 K/uL 6.1 6.1 6.1  Hemoglobin 12.0 - 15.0 g/dL 7.9(L) 7.5(L) 8.0(L)  Hematocrit 36.0 - 46.0 % 24.1(L) 22.4(L) 24.7(L)  Platelets 150 - 400 K/uL 145(L) 158 148(L)      BMET  Recent Labs  01/29/15 0454 01/29/15 0838  NA QUESTIONABLE RESULTS, RECOMMEND RECOLLECT TO VERIFY 137  K QUESTIONABLE RESULTS, RECOMMEND RECOLLECT TO VERIFY 3.2*  CL QUESTIONABLE RESULTS, RECOMMEND RECOLLECT TO VERIFY 106  CO2 QUESTIONABLE RESULTS, RECOMMEND RECOLLECT TO VERIFY 24   GLUCOSE QUESTIONABLE RESULTS, RECOMMEND RECOLLECT TO VERIFY 115*  BUN QUESTIONABLE RESULTS, RECOMMEND RECOLLECT TO VERIFY <5*  CREATININE QUESTIONABLE RESULTS, RECOMMEND RECOLLECT TO VERIFY 1.10*  CALCIUM QUESTIONABLE RESULTS, RECOMMEND RECOLLECT TO VERIFY 8.1*     Liver Panel   Recent Labs  01/28/15 0350  PROT 6.5  ALBUMIN 2.2*  AST 20  ALT 10*  ALKPHOS 67  BILITOT 0.8       Sedimentation Rate No results for input(s): ESRSEDRATE in the last 72 hours. C-Reactive Protein No results for input(s): CRP in the last 72 hours.  Micro Results: Recent Results (from the past 720 hour(s))  Culture, blood (routine x 2)     Status: None   Collection Time: 01/06/15 10:42 AM  Result Value Ref Range Status   Specimen Description BLOOD LEFT ANTECUBITAL  Final   Special Requests BOTTLES DRAWN AEROBIC AND ANAEROBIC 5ML  Final   Culture  Setup Time   Final    GRAM NEGATIVE RODS IN BOTH AEROBIC AND ANAEROBIC BOTTLES CRITICAL RESULT CALLED TO, READ BACK BY AND VERIFIED WITH: [name] @0235  01/07/15 [name]    Culture   Final    KLEBSIELLA PNEUMONIAE SUSCEPTIBILITIES PERFORMED ON PREVIOUS CULTURE WITHIN THE LAST 5 DAYS. Performed at [facility]    Report Status 01/10/2015 FINAL  Final  Urine culture     Status: None   Collection Time: 01/06/15 10:44 AM  Result Value Ref Range Status   Specimen Description URINE, CATHETERIZED  Final   Special Requests NONE  Final   Culture   Final    NO GROWTH 1 DAY Performed at [facility]    Report Status 01/07/2015 FINAL  Final  Culture, blood (routine x 2)     Status: None   Collection Time: 01/06/15 11:00 AM  Result Value Ref Range Status   Specimen Description BLOOD PORTA CATH  Final   Special Requests BOTTLES DRAWN AEROBIC AND ANAEROBIC 5CC  Final   Culture  Setup Time   Final    GRAM NEGATIVE RODS IN BOTH AEROBIC AND ANAEROBIC BOTTLES CRITICAL RESULT CALLED TO, READ BACK BY AND VERIFIED WITH: [name] RN 2129 01/06/15  [name]    Culture   Final    KLEBSIELLA PNEUMONIAE ESCHERICHIA COLI Performed at [facility]    Report Status 01/09/2015 FINAL  Final   Organism ID, Bacteria KLEBSIELLA PNEUMONIAE  Final   Organism ID, Bacteria ESCHERICHIA COLI  Final      Susceptibility   Escherichia coli - MIC*    AMPICILLIN 8 SENSITIVE Sensitive     CEFAZOLIN <=4 SENSITIVE Sensitive     CEFEPIME <=1 SENSITIVE Sensitive     CEFTAZIDIME <=1 SENSITIVE Sensitive     CEFTRIAXONE <=1 SENSITIVE Sensitive     CIPROFLOXACIN <=0.25 SENSITIVE Sensitive  GENTAMICIN <=1 SENSITIVE Sensitive     IMIPENEM <=0.25 SENSITIVE Sensitive     TRIMETH/SULFA <=20 SENSITIVE Sensitive     AMPICILLIN/SULBACTAM 4 SENSITIVE Sensitive     PIP/TAZO <=4 SENSITIVE Sensitive     * ESCHERICHIA COLI   Klebsiella pneumoniae - MIC*    AMPICILLIN >=32 RESISTANT Resistant     CEFAZOLIN <=4 SENSITIVE Sensitive     CEFEPIME <=1 SENSITIVE Sensitive     CEFTAZIDIME <=1 SENSITIVE Sensitive     CEFTRIAXONE <=1 SENSITIVE Sensitive     CIPROFLOXACIN <=0.25 SENSITIVE Sensitive     GENTAMICIN <=1 SENSITIVE Sensitive     IMIPENEM <=0.25 SENSITIVE Sensitive     TRIMETH/SULFA <=20 SENSITIVE Sensitive     AMPICILLIN/SULBACTAM 4 SENSITIVE Sensitive     PIP/TAZO <=4 SENSITIVE Sensitive     * KLEBSIELLA PNEUMONIAE  MRSA PCR Screening     Status: None   Collection Time: 01/06/15  4:38 PM  Result Value Ref Range Status   MRSA by PCR NEGATIVE NEGATIVE Final    Comment:        The GeneXpert MRSA Assay (FDA approved for NASAL specimens only), is one component of a comprehensive MRSA colonization surveillance program. It is not intended to diagnose MRSA infection nor to guide or monitor treatment for MRSA infections.   Culture, body fluid-bottle     Status: None   Collection Time: 01/06/15  7:12 PM  Result Value Ref Range Status   Specimen Description FLUID BILE  Final   Special Requests RUQ  Final   Gram Stain   Final    GRAM VARIABLE  ROD IN BOTH AEROBIC AND ANAEROBIC BOTTLES CRITICAL RESULT CALLED TO, READ BACK BY AND VERIFIED WITH: BARHAM @0509  01/07/15 [name]    Culture   Final    ENTEROBACTER CLOACAE MODIFIED HODGE TEST=POSITIVE CRITICAL RESULT CALLED TO, READ BACK BY AND VERIFIED WITH: Orville Govern 01/11/15 @ 1053 M VESTAL Performed at [facility]    Report Status 01/11/2015 FINAL  Final   Organism ID, Bacteria ENTEROBACTER CLOACAE  Final      Susceptibility   Enterobacter cloacae - MIC*    CEFAZOLIN >=64 RESISTANT Resistant     CEFEPIME 2 RESISTANT Resistant     CEFTAZIDIME >=64 RESISTANT Resistant     CEFTRIAXONE >=64 RESISTANT Resistant     CIPROFLOXACIN <=0.25 SENSITIVE Sensitive     GENTAMICIN <=1 SENSITIVE Sensitive     IMIPENEM <=0.25 RESISTANT Resistant     TRIMETH/SULFA <=20 SENSITIVE Sensitive     PIP/TAZO >=128 RESISTANT Resistant     * ENTEROBACTER CLOACAE  Gram stain     Status: None   Collection Time: 01/06/15  7:12 PM  Result Value Ref Range Status   Specimen Description FLUID BILE  Final   Special Requests RUQ  Final   Gram Stain   Final    CYTOSPIN SMEAR BUDDING YEAST SEEN GRAM VARIABLE ROD GRAM POSITIVE COCCI IN CLUSTERS CRITICAL RESULT CALLED TO, READ BACK BY AND VERIFIED WITH: [name] @0235  01/07/15 [name]    Report Status 01/07/2015 FINAL  Final  Culture, blood (routine x 2)     Status: None   Collection Time: 01/09/15 12:20 PM  Result Value Ref Range Status   Specimen Description BLOOD RIGHT ARM  Final   Special Requests IN PEDIATRIC BOTTLE 5CC  Final   Culture   Final    NO GROWTH 5 DAYS Performed at [facility]    Report Status 01/14/2015 FINAL  Final  Culture,  blood (routine x 2)     Status: None   Collection Time: 01/09/15 12:34 PM  Result Value Ref Range Status   Specimen Description BLOOD RIGHT ARM  Final   Special Requests BOTTLES DRAWN AEROBIC AND ANAEROBIC 10CC  Final   Culture   Final    NO GROWTH 5 DAYS Performed at [facility]     Report Status 01/14/2015 FINAL  Final  Blood culture (routine single)     Status: None   Collection Time: 01/20/15  3:21 PM  Result Value Ref Range Status   Blood Culture, Routine Culture, Blood  Final    Comment: Final - ===== FINAL REPORT =====NO GROWTH 5 DAYS  Urine Culture     Status: None   Collection Time: 01/20/15  3:21 PM  Result Value Ref Range Status   Urine Culture, Routine Culture, Urine  Final    Comment: Final - ===== COLONY COUNT: ===== 60,000 COLONIES/ML ESCHERICHIA COLI  ------------------------------------------------------------------------  ESCHERICHIA COLI     AMPICILLIN                       MIC      Resistant       >=32 ug/ml    AMOX/CLAVULANIC                  MIC      Indeterminate     16 ug/ml    AMPICILLIN/SUL                   MIC      Resistant       >=32 ug/ml    PIPERACILLIN/TAZO                MIC      Resistant      >=128 ug/ml    IMIPENEM                         MIC      Sensitive     <=0.25 ug/ml    CEFAZOLIN                        MIC      Resistant          8 ug/ml    CEFTRIAXONE                      MIC      Sensitive        <=1 ug/ml    CEFTAZIDIME                      MIC      Sensitive        <=1 ug/ml    CEFEPIME                         MIC      Sensitive        <=1 ug/ml    GENTAMICIN                       MIC      Sensitive        <=1 ug/ml    TOBRAMYCIN                       MIC  Sensitive        <=1 ug/ml    CIPROFLOXACIN                     MIC      Resistant        >=4 ug/ml    LEVOFLOXACIN                     MIC      Resistant        >=8 ug/ml    NITROFURANTOIN                   MIC      Sensitive       <=16 ug/ml    TRIMETH/SULFA                    MIC      Resistant      >=320 ug/ml  ------------------------------------------------------------------------ NR=NOT REPORTABLE,SEE COMMENT ORAL therapy:A cefazolin MIC of <32 predicts  susceptibility to the oral agents  cefaclor, cefdinir,cefpodoxime,cefprozil,cefuroxime, cephalexin,and loracarbef when used for therapy  of uncomplicated UTIs due to E.coli,K.pneumomiae, and P.mirabilis. PARENTERAL therapy: A cefazolin MIC of >8 indicates resistance to parenteral cefazolin. An alternate test method must be performed to confirm susceptibility to parenteral cefazolin. END OF REPORT   Blood culture (routine single)     Status: None   Collection Time: 01/20/15  3:22 PM  Result Value Ref Range Status   Blood Culture, Routine Culture, Blood  Final    Comment: ------------------------------------------------------------------------Gram Stain Report Called to,Read Back Byand Verified With:ROSALYND W @ 9:39 AM10/28/16 BY DWEEKSFinal - ===== FINAL REPORT =====KLEBSIELLA  PNEUMONIAE------------------------------------------------------------------------ KLEBSIELLA PNEUMONIAE   AMPICILLIN                       MIC      Resistant       >=32 ug/ml   AMOX/CLAVULANIC                  MIC      Sensitive        <=2 ug/ml    AMPICILLIN/SUL                   MIC      Sensitive          4 ug/ml   PIPERACILLIN/TAZO                MIC      Sensitive          8 ug/ml   IMIPENEM                         MIC      Sensitive     <=0.25 ug/ml   CEFAZOLIN                        MIC       NR               <=4 ug/ml   CEFTRIAXONE                      MIC      Sensitive        <=1 ug/ml   CEFTAZIDIME                      MIC  Sensitive        <=1 ug/ml   CEFEPIME                         MIC      Sensitive        <=1 ug/ml   GENTAMICIN                        MI C      Sensitive        <=1 ug/ml   TOBRAMYCIN                       MIC      Sensitive        <=1 ug/ml   CIPROFLOXACIN                    MIC      Indeterminate        ug/ml   LEVOFLOXACIN                     MIC       Indeterminate      4 ug/ml   TRIMETH/SULFA                    MIC      Sensitive         40  ug/ml------------------------------------------------------------------------NR=NOT REPORTABLE,SEE COMMENTORAL therapy:A cefazolin MIC of <32 predicts  susceptibility to the oral agents cefaclor,cefdinir,cefpodoxime,cefprozil,cefuroxime,cephalexin,and loracarbef when used for therapy of uncomplicated UTIs due to E.coli,K.pneumomiae,and P.mirabilis. PARENTERAL therapy: A cefazolinMIC of >8 indicates  resistance to parenteralcefazolin. An alternate test method must beperformed to confirm susceptibility to parenteralcefazolin.END OF REPORT   Urine culture     Status: None   Collection Time: 01/20/15  7:30 PM  Result Value Ref Range Status   Specimen Description URINE, RANDOM  Final   Special Requests NONE  Final   Culture   Final    9,000 COLONIES/mL INSIGNIFICANT GROWTH Performed at [facility]    Report Status 01/22/2015 FINAL  Final  Culture, blood (routine x 2)     Status: None (Preliminary result)   Collection Time: 01/25/15  6:49 PM  Result Value Ref Range Status   Specimen Description BLOOD RIGHT HAND  Final   Special Requests IN PEDIATRIC BOTTLE 3CC  Final   Culture   Final    NO GROWTH 3 DAYS Performed at [facility]    Report Status PENDING  Incomplete  Culture, blood (routine x 2)     Status: None (Preliminary result)   Collection Time: 01/25/15  6:49 PM  Result Value Ref Range Status   Specimen Description BLOOD LEFT ANTECUBITAL  Final   Special Requests BOTTLES DRAWN AEROBIC AND ANAEROBIC 10CC EA  Final   Culture   Final    NO GROWTH 3 DAYS Performed at [facility]    Report Status PENDING  Incomplete    Studies/Results: Dg Abd 1 View  01/29/2015  CLINICAL DATA:  44 year old female scheduled to undergo CT scan. Evaluating volume of residual barium. EXAM: ABDOMEN - 1 VIEW COMPARISON:  Prior abdominal radiograph 01/28/2015 FINDINGS: New persistent but decreasing retained barium in the colon. There is been and approximately 50% reduction in the  amount of retained barium since the prior study. Well-positioned right internal/ external biliary drain. No evidence of obstruction. No acute osseous abnormality. IMPRESSION: Approximately 50%  decrease in retained barium throughout the colon compared to 01/28/2015. There is likely still enough retained barium to result in significant CT image degradation from streak artifact. Recommend repeat abdominal radiograph in 24 or 48 hours. Electronically Signed   By: Jacqulynn Cadet M.D.   On: 01/29/2015 10:45   Dg Abd 1 View  01/28/2015  CLINICAL DATA:  Abdominal pain.  Gastric carcinoma. EXAM: ABDOMEN - 1 VIEW COMPARISON:  01/26/2015 FINDINGS: Percutaneous biliary drainage catheter remains in expected position. Surgical clips again seen in the right upper quadrant. No evidence of dilated bowel loops. A moderate amount of residual oral contrast material is seen throughout the majority of the colon. IMPRESSION: Moderate residual oral contrast throughout the colon. Otherwise normal bowel gas pattern. Electronically Signed   By: Earle Gell M.D.   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 She was dc on cipro and now readmitted  with septic picture  #1 Sepsis:  Difficult picture in trying to pin down what specific pathogen is responsible for her current presentation.  The CRE in biliary drain is not at all a clear pathogen here as the drain is going to be colonized with bacteria and the culture from October 12ths is nota compromised by it coming from a plastic drain.  She also DID receive antibiotics that would be effective vs that organism in form of cipro and more recently AVYCAZ.  The organism in the urine is NOT a pathogen and was not even recoverable 4 hours later. She also lacked ANY urinary ssx so not c/w UTI so we can ignore it  I WILL take the blood culture organisms seriously though it is difficult to pin as the culprits since in only 1/2 cultures so could Be a contaminant  For now I will narrow to IV Cheree Ditto which will cover the organism in the blood  Hopefully she can have CT scan tomorrow or Sunday.  IF so would be very helpful to have IR obtain a fresh culture from such a spot via IR drainage (but obviously not helpful via her existing drain from diagnositic standpoint since cx through existing drain compromised  For now continue IV unasyn. If no new drainable foci of infection then change to augmentin and send hom with 30 day rx in hopes of keeping infection in biliary tree suppressed.  Dr. Megan Salon is available this weekend for questions.   LOS: 9 days   Alcide Evener 01/29/2015, 11:56 AM

## 2015-01-30 ENCOUNTER — Encounter (HOSPITAL_COMMUNITY): Payer: Self-pay | Admitting: Radiology

## 2015-01-30 ENCOUNTER — Inpatient Hospital Stay (HOSPITAL_COMMUNITY): Payer: 59

## 2015-01-30 LAB — CULTURE, BLOOD (ROUTINE X 2)
CULTURE: NO GROWTH
CULTURE: NO GROWTH

## 2015-01-30 LAB — CBC
HCT: 18.8 % — ABNORMAL LOW (ref 36.0–46.0)
HCT: 25.1 % — ABNORMAL LOW (ref 36.0–46.0)
HEMOGLOBIN: 6.2 g/dL — AB (ref 12.0–15.0)
HEMOGLOBIN: 8.2 g/dL — AB (ref 12.0–15.0)
MCH: 27 pg (ref 26.0–34.0)
MCH: 27.1 pg (ref 26.0–34.0)
MCHC: 33 g/dL (ref 30.0–36.0)
MCHC: 32.7 g/dL (ref 30.0–36.0)
MCV: 81.7 fL (ref 78.0–100.0)
MCV: 82.8 fL (ref 78.0–100.0)
PLATELETS: 173 10*3/uL (ref 150–400)
Platelets: 136 10*3/uL — ABNORMAL LOW (ref 150–400)
RBC: 2.3 MIL/uL — AB (ref 3.87–5.11)
RBC: 3.03 MIL/uL — ABNORMAL LOW (ref 3.87–5.11)
RDW: 17.1 % — AB (ref 11.5–15.5)
RDW: 17.3 % — AB (ref 11.5–15.5)
WBC: 5 10*3/uL (ref 4.0–10.5)
WBC: 8.5 10*3/uL (ref 4.0–10.5)

## 2015-01-30 LAB — BASIC METABOLIC PANEL
Anion gap: 4 — ABNORMAL LOW (ref 5–15)
BUN: 7 mg/dL (ref 6–20)
CALCIUM: 7.9 mg/dL — AB (ref 8.9–10.3)
CO2: 27 mmol/L (ref 22–32)
CREATININE: 1.07 mg/dL — AB (ref 0.44–1.00)
Chloride: 109 mmol/L (ref 101–111)
GFR calc Af Amer: >60 mL/min (ref >60)
GFR calc non Af Amer: >60 mL/min (ref >60)
GLUCOSE: 90 mg/dL (ref 65–99)
Potassium: 3.2 mmol/L — ABNORMAL LOW (ref 3.5–5.1)
Sodium: 140 mmol/L (ref 135–145)

## 2015-01-30 MED ORDER — OXYCODONE HCL 5 MG/5ML PO SOLN: 10.0000 mg | ORAL | Status: DC | PRN

## 2015-01-30 MED ORDER — POTASSIUM CHLORIDE CRYS ER 20 MEQ PO TBCR
40.0000 meq | EXTENDED_RELEASE_TABLET | Freq: Once | ORAL | Status: AC
  Administered 2015-01-30: 40 meq via ORAL
  Filled 2015-01-30: qty 2

## 2015-01-30 MED ORDER — AMOXICILLIN-POT CLAVULANATE 875-125 MG PO TABS: 1.0000 | ORAL_TABLET | Freq: Two times a day (BID) | ORAL | Status: DC

## 2015-01-30 MED ORDER — IOHEXOL 300 MG/ML  SOLN
100.0000 mL | Freq: Once | INTRAMUSCULAR | Status: AC | PRN
  Administered 2015-01-30: 100 mL via INTRAVENOUS

## 2015-01-30 MED ORDER — MORPHINE SULFATE ER 15 MG PO TBCR: 15.0000 mg | EXTENDED_RELEASE_TABLET | Freq: Two times a day (BID) | ORAL | Status: DC

## 2015-01-30 MED ORDER — IOHEXOL 300 MG/ML  SOLN
50.0000 mL | Freq: Once | INTRAMUSCULAR | Status: AC | PRN
Start: 2015-01-30 — End: 2015-01-30
  Administered 2015-01-30: 50 mL via ORAL

## 2015-01-30 NOTE — Progress Notes (Signed)
[facility] PROGRESS NOTE  Andrea Best IPJ:[id number] DOB: 1970/08/25 DOA: 01/20/2015 PCP: Milagros Evener, MD Brief history: 44 year old lady with h/o gastric cancer s/p partial gastrectomy with metastatic disease, with last chemo therapy in the first week of October, and recent external biliary drain placement for obstruction on 9/29, presented to ED in the first week of October for sepsis. During the hospitalization she grew klebsiella and E coli in the blood and Enterobacter species ( CRE) from the biliary drain  (cultures were drawn from the biliary drain by IR). All of the bacteria were sensitive to ciprofloxacin and she was discharged on oral ciprofloxacin on 10/17 to complete the course.  She presents to cancer center on 10/26 for sepsis and acute renal failure from dehydration and this time her urine cultures grew 60,000 colonies resistant to ciprofloxacin, but sensitive to Augmentin and one blood culture growing klebsiella ,pan sensitive.   But because of her CRE, Dr.Akula requested Dr Linus Salmons for recommendations, started her on Avycaz. She has been on Avycaz from 10/28. ID Dr Tommy Medal consulted. Meanwhile during her hospitalization she has vomited at least once a day. She attributed it to having a full stomach.  Dr Learta Codding recommended upper GI series, unremarkable CT abd pending-pending clearing of barium  Assessment/Plan: Sepsis  -improving, was febrile, hypotensive and tachycardic on arrival to cancer center,  -Lactic acid elevated.  Repeat lactic acid normalized after fluid boluses.  -Blood cultures 10/26 grew klebsiella 1/2 . Urine cultures grew 60,000 e coli resistent to ciprofloxacin.  -repeat Blood Cx 10/31: negative -Dr.Akula d/w Dr Linus Salmons recommended Zadie Rhine, dosing per pharmacy. -Dr.VanDam following and started on IV Unasyn 10/3,   -last admission with e coli and klebsiella bacteremia and Enterobacter in biliary fluid -clinically much improved, KUb withou  barium, CT abd today -If stable, DC home on Long term augmentin  Metastatic gastric Ca -Chemo on hold -followed by Dr.Sherril  Malignant biliary obstruction  -s/p I/E biliary drain 11/19/14 with last exchange on 12/24/14; recent biliary sepsis -IR following and drain felt to be functioning well -drain care, FU with IR to make decision regarding biliary stent  Nausea/Vomiting, Abd pain -due to above, supportive care, improving -CT as discussed above -upper Gi series unremarkable  Anemia of chronic disease and chemo -stable, monitor -he is jehovah's witness and will not receive blood products.   Sjogren's disease: plaquenil on hold.   Acute renal failure: -possibly from the hypotension and decreased renal perfusion. Improving with hydration. Cut down IVF.   Asthma; -no wheezing  DVT proph: lovenox  Code Status: full code. Family Communication: no family at bedside.  Disposition Plan: home later today?   Consultants:  Oncology Dr Benay Spice.  ID Dr Tommy Medal  IR  Procedures: None  Antibiotics:  avycaz 10/28 till date.    vancomycin and zosyn -10/26 till 10/27  IV cipro on 10/27 to 10/28.    HPI/Subjective: Feels well, less abd pain, no N/V wanst to go home  Objective: Filed Vitals:   01/30/15 1359  BP: 109/65  Pulse: 110  Temp: 99.1 F (37.3 C)  Resp: 18    Intake/Output Summary (Last 24 hours) at 01/30/15 1440 Last data filed at 01/30/15 0909  Gross per 24 hour  Intake      0 ml  Output     25 ml  Net    -25 ml   Filed Weights   01/20/15 1744  Weight: 60.056 kg (132 lb 6.4 oz)    Exam:  General:  Alert afebrile comfortable.   Cardiovascular: s1s2  Respiratory: ctab,   Abdomen: soft nt ND BS+, biliary drain  Musculoskeletal: no pedal edema.   Data Reviewed: Basic Metabolic Panel:  Recent Labs Lab 01/26/15 0305 01/27/15 0425 01/28/15 0350 01/29/15 0454 01/29/15 0838 01/30/15 0520  NA 139  --  139 QUESTIONABLE RESULTS,  RECOMMEND RECOLLECT TO VERIFY 137 140  K 3.5  --  3.1* QUESTIONABLE RESULTS, RECOMMEND RECOLLECT TO VERIFY 3.2* 3.2*  CL 111  --  108 QUESTIONABLE RESULTS, RECOMMEND RECOLLECT TO VERIFY 106 109  CO2 24  --  26 QUESTIONABLE RESULTS, RECOMMEND RECOLLECT TO VERIFY 24 27  GLUCOSE 97  --  88 QUESTIONABLE RESULTS, RECOMMEND RECOLLECT TO VERIFY 115* 90  BUN <5*  --  <5* QUESTIONABLE RESULTS, RECOMMEND RECOLLECT TO VERIFY <5* 7  CREATININE 1.17* 1.11* 1.15* QUESTIONABLE RESULTS, RECOMMEND RECOLLECT TO VERIFY 1.10* 1.07*  CALCIUM 8.2*  --  8.1* QUESTIONABLE RESULTS, RECOMMEND RECOLLECT TO VERIFY 8.1* 7.9*   Liver Function Tests:  Recent Labs Lab 01/28/15 0350  AST 20  ALT 10*  ALKPHOS 67  BILITOT 0.8  PROT 6.5  ALBUMIN 2.2*   No results for input(s): LIPASE, AMYLASE in the last 168 hours. No results for input(s): AMMONIA in the last 168 hours. CBC:  Recent Labs Lab 01/24/15 0401 01/28/15 0350 01/29/15 0838 01/30/15 0520 01/30/15 1105  WBC 6.1 6.1 6.1 5.0 8.5  HGB 8.0* 7.5* 7.9* 6.2* 8.2*  HCT 24.7* 22.4* 24.1* 18.8* 25.1*  MCV 81.0 82.1 81.7 81.7 82.8  PLT 148* 158 145* 136* 173   Cardiac Enzymes: No results for input(s): CKTOTAL, CKMB, CKMBINDEX, TROPONINI in the last 168 hours. BNP (last 3 results)  Recent Labs  01/06/15 1450  BNP 108.2*    ProBNP (last 3 results) No results for input(s): PROBNP in the last 8760 hours.  CBG: No results for input(s): GLUCAP in the last 168 hours.  Recent Results (from the past 240 hour(s))  Blood culture (routine single)     Status: None   Collection Time: 01/20/15  3:21 PM  Result Value Ref Range Status   Blood Culture, Routine Culture, Blood  Final    Comment: Final - ===== FINAL REPORT =====NO GROWTH 5 DAYS  Urine Culture     Status: None   Collection Time: 01/20/15  3:21 PM  Result Value Ref Range Status   Urine Culture, Routine Culture, Urine  Final    Comment: Final - ===== COLONY COUNT: ===== 60,000  COLONIES/ML ESCHERICHIA COLI  ------------------------------------------------------------------------  ESCHERICHIA COLI     AMPICILLIN                       MIC      Resistant       >=32 ug/ml    AMOX/CLAVULANIC                  MIC      Indeterminate     16 ug/ml    AMPICILLIN/SUL                   MIC      Resistant       >=32 ug/ml    PIPERACILLIN/TAZO                MIC      Resistant      >=128 ug/ml    IMIPENEM  MIC      Sensitive     <=0.25 ug/ml    CEFAZOLIN                        MIC      Resistant          8 ug/ml    CEFTRIAXONE                      MIC      Sensitive        <=1 ug/ml    CEFTAZIDIME                      MIC      Sensitive        <=1 ug/ml    CEFEPIME                         MIC      Sensitive        <=1 ug/ml    GENTAMICIN                       MIC      Sensitive        <=1 ug/ml    TOBRAMYCIN                       MIC      Sensitive        <=1 ug/ml    CIPROFLOXACIN                     MIC      Resistant        >=4 ug/ml    LEVOFLOXACIN                     MIC      Resistant        >=8 ug/ml    NITROFURANTOIN                   MIC      Sensitive       <=16 ug/ml    TRIMETH/SULFA                    MIC      Resistant      >=320 ug/ml  ------------------------------------------------------------------------ NR=NOT REPORTABLE,SEE COMMENT ORAL therapy:A cefazolin MIC of <32 predicts  susceptibility to the oral agents cefaclor, cefdinir,cefpodoxime,cefprozil,cefuroxime, cephalexin,and loracarbef when used for therapy  of uncomplicated UTIs due to E.coli,K.pneumomiae, and P.mirabilis. PARENTERAL therapy: A cefazolin MIC of >8 indicates resistance to parenteral cefazolin. An alternate test method must be performed to confirm susceptibility to parenteral cefazolin. END OF REPORT   Blood culture (routine single)     Status: None   Collection Time: 01/20/15  3:22 PM  Result Value Ref Range Status   Blood Culture, Routine Culture, Blood   Final    Comment: ------------------------------------------------------------------------Gram Stain Report Called to,Read Back Byand Verified With:ROSALYND W @ 9:39 AM10/28/16 BY DWEEKSFinal - ===== FINAL REPORT =====KLEBSIELLA  PNEUMONIAE------------------------------------------------------------------------ KLEBSIELLA PNEUMONIAE   AMPICILLIN                       MIC      Resistant       >=32 ug/ml   AMOX/CLAVULANIC  MIC      Sensitive        <=2 ug/ml    AMPICILLIN/SUL                   MIC      Sensitive          4 ug/ml   PIPERACILLIN/TAZO                MIC      Sensitive          8 ug/ml   IMIPENEM                         MIC      Sensitive     <=0.25 ug/ml   CEFAZOLIN                        MIC       NR               <=4 ug/ml   CEFTRIAXONE                      MIC      Sensitive        <=1 ug/ml   CEFTAZIDIME                      MIC      Sensitive        <=1 ug/ml   CEFEPIME                         MIC      Sensitive        <=1 ug/ml   GENTAMICIN                        MI C      Sensitive        <=1 ug/ml   TOBRAMYCIN                       MIC      Sensitive        <=1 ug/ml   CIPROFLOXACIN                    MIC      Indeterminate        ug/ml   LEVOFLOXACIN                     MIC       Indeterminate      4 ug/ml   TRIMETH/SULFA                    MIC      Sensitive         40 ug/ml------------------------------------------------------------------------NR=NOT REPORTABLE,SEE COMMENTORAL therapy:A cefazolin MIC of <32 predicts  susceptibility to the oral agents cefaclor,cefdinir,cefpodoxime,cefprozil,cefuroxime,cephalexin,and loracarbef when used for therapy of uncomplicated UTIs due to E.coli,K.pneumomiae,and P.mirabilis. PARENTERAL therapy: A cefazolinMIC of >8 indicates  resistance to parenteralcefazolin. An alternate test method must beperformed to confirm susceptibility to parenteralcefazolin.END OF REPORT   Urine culture     Status: None   Collection Time: 01/20/15   7:30 PM  Result Value Ref Range Status   Specimen Description URINE, RANDOM  Final   Special Requests NONE  Final   Culture   Final  9,000 COLONIES/mL INSIGNIFICANT GROWTH Performed at [facility]    Report Status 01/22/2015 FINAL  Final  Culture, blood (routine x 2)     Status: None   Collection Time: 01/25/15  6:49 PM  Result Value Ref Range Status   Specimen Description BLOOD RIGHT HAND  Final   Special Requests IN PEDIATRIC BOTTLE 3CC  Final   Culture   Final    NO GROWTH 5 DAYS Performed at [facility]    Report Status 01/30/2015 FINAL  Final  Culture, blood (routine x 2)     Status: None   Collection Time: 01/25/15  6:49 PM  Result Value Ref Range Status   Specimen Description BLOOD LEFT ANTECUBITAL  Final   Special Requests BOTTLES DRAWN AEROBIC AND ANAEROBIC 10CC EA  Final   Culture   Final    NO GROWTH 5 DAYS Performed at [facility]    Report Status 01/30/2015 FINAL  Final     Studies: Dg Abd 1 View  01/30/2015  CLINICAL DATA:  44 year old female with a history of abdominal pain. EXAM: ABDOMEN - 1 VIEW COMPARISON:  01/29/2015 FINDINGS: Compared to prior, there has been evacuation of residual enteric contrast within the colon, with no retained enteric contrast. No abnormally distended small bowel or colon. Pigtail drainage catheter in the right upper quadrant unchanged. Surgical clips of the right upper quadrant. No unexpected calcifications or radiopaque foreign body. IMPRESSION: Interval evacuation of enteric contrast from the colon with no residual contrast. Surgical drain unchanged in place within the right upper quadrant. Signed, Dulcy Fanny. Earleen Newport, DO [facility] Electronically Signed   By: Corrie Mckusick D.O.   On: 01/30/2015 09:00   Dg Abd 1 View  01/29/2015  CLINICAL DATA:  44 year old female scheduled to undergo CT scan. Evaluating volume of residual barium. EXAM: ABDOMEN - 1 VIEW  COMPARISON:  Prior abdominal radiograph 01/28/2015 FINDINGS: New persistent but decreasing retained barium in the colon. There is been and approximately 50% reduction in the amount of retained barium since the prior study. Well-positioned right internal/ external biliary drain. No evidence of obstruction. No acute osseous abnormality. IMPRESSION: Approximately 50% decrease in retained barium throughout the colon compared to 01/28/2015. There is likely still enough retained barium to result in significant CT image degradation from streak artifact. Recommend repeat abdominal radiograph in 24 or 48 hours. Electronically Signed   By: Jacqulynn Cadet M.D.   On: 01/29/2015 10:45    Scheduled Meds: . ampicillin-sulbactam (UNASYN) IV  3 g Intravenous Q6H  . enoxaparin (LOVENOX) injection  40 mg Subcutaneous Q24H  . feeding supplement (PRO-STAT SUGAR FREE 64)  30 mL Oral TID BM  . ferrous sulfate  325 mg Oral BID WC  . lactose free nutrition  237 mL Oral TID WC  . morphine  15 mg Oral Q12H  . 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

## 2015-01-31 LAB — BASIC METABOLIC PANEL
Anion gap: 5 (ref 5–15)
BUN: 9 mg/dL (ref 6–20)
CALCIUM: 7.7 mg/dL — AB (ref 8.9–10.3)
CO2: 26 mmol/L (ref 22–32)
CREATININE: 1.09 mg/dL — AB (ref 0.44–1.00)
Chloride: 105 mmol/L (ref 101–111)
GFR calc non Af Amer: >60 mL/min (ref >60)
Glucose, Bld: 94 mg/dL (ref 65–99)
Potassium: 3.2 mmol/L — ABNORMAL LOW (ref 3.5–5.1)
SODIUM: 136 mmol/L (ref 135–145)

## 2015-01-31 LAB — CBC
HCT: 21.4 % — ABNORMAL LOW (ref 36.0–46.0)
Hemoglobin: 6.9 g/dL — CL (ref 12.0–15.0)
MCH: 26.5 pg (ref 26.0–34.0)
MCHC: 32.2 g/dL (ref 30.0–36.0)
MCV: 82.3 fL (ref 78.0–100.0)
PLATELETS: 141 10*3/uL — AB (ref 150–400)
RBC: 2.6 MIL/uL — ABNORMAL LOW (ref 3.87–5.11)
RDW: 17.2 % — AB (ref 11.5–15.5)
WBC: 5.5 10*3/uL (ref 4.0–10.5)

## 2015-01-31 LAB — C DIFFICILE QUICK SCREEN W PCR REFLEX
C DIFFICILE (CDIFF) TOXIN: NEGATIVE
C DIFFICLE (CDIFF) ANTIGEN: NEGATIVE
C Diff interpretation: NEGATIVE

## 2015-01-31 MED ORDER — DARBEPOETIN ALFA 100 MCG/0.5ML IJ SOSY
100.0000 ug | PREFILLED_SYRINGE | Freq: Once | INTRAMUSCULAR | Status: DC
  Filled 2015-01-31: qty 0.5

## 2015-01-31 NOTE — Progress Notes (Signed)
[facility] PROGRESS NOTE  MAYCEE BLASCO XAJ:[id number] DOB: September 27, 1970 DOA: 01/20/2015 PCP: Milagros Evener, MD Brief history: 44 year old lady with h/o gastric cancer s/p partial gastrectomy with metastatic disease, with last chemo therapy in the first week of October, and recent external biliary drain placement for obstruction on 9/29, presented to ED in the first week of October for sepsis. During the hospitalization she grew klebsiella and E coli in the blood and Enterobacter species ( CRE) from the biliary drain  (cultures were drawn from the biliary drain by IR). All of the bacteria were sensitive to ciprofloxacin and she was discharged on oral ciprofloxacin on 10/17 to complete the course.  She presents to cancer center on 10/26 for sepsis and acute renal failure from dehydration and this time her urine cultures grew 60,000 colonies resistant to ciprofloxacin, but sensitive to Augmentin and one blood culture growing klebsiella ,pan sensitive.   But because of her CRE, Dr.Akula requested Dr Linus Salmons for recommendations, started her on Avycaz. She has been on Avycaz from 10/28. ID Dr Tommy Medal consulted. Meanwhile during her hospitalization she has vomited at least once a day. She attributed it to having a full stomach.  Dr Learta Codding recommended upper GI series, unremarkable CT abd pending-pending clearing of barium  Assessment/Plan: Sepsis  -improving, was febrile, hypotensive and tachycardic on arrival to cancer center,  -Lactic acid elevated.  Repeat lactic acid normalized after fluid boluses.  -Blood cultures 10/26 grew klebsiella 1/2 . Urine cultures grew 60,000 e coli resistent to ciprofloxacin.  -repeat Blood Cx 10/31: negative -Dr.VanDam following and started on IV Unasyn 11/3,   -last admission with e coli and klebsiella bacteremia and Enterobacter in biliary fluid -clinically much improved, CT was delayed since she had to Barium in her bowel from Upper GI series,  CT abd  11/5 with increase Hydronephrosis and ureteral obstruction, Consulted Urology today, d/w Dr.Eskridge, he thinks she will need a R ureteral stent which could likely be done tomorrow  Metastatic gastric Ca -Chemo on hold -followed by Dr.Sherril  Anemia of chronic disease and chemo -mild worsening, asymptomatic, give Aranesp today -she is a jehovah's witness and will not receive blood products.   Malignant biliary obstruction  -s/p I/E biliary drain 11/19/14 with last exchange on 12/24/14; recent biliary sepsis -IR following and drain felt to be functioning well -drain care, FU with IR to make decision regarding biliary stent  Nausea/Vomiting, Abd pain -due to above, supportive care, improving -CT as discussed above -upper Gi series unremarkable  Sjogren's disease: plaquenil on hold.   Acute renal failure: -possibly from the hypotension and decreased renal perfusion. Improving with hydration. Cut down IVF.   Asthma; -no wheezing  DVT proph: lovenox  Code Status: full code. Family Communication: mother at bedside.  Disposition Plan: home later today?   Consultants:  Oncology Dr Benay Spice.  ID Dr Tommy Medal  IR  Procedures: None  Antibiotics:  avycaz 10/28 till date.    vancomycin and zosyn -10/26 till 10/27  IV cipro on 10/27 to 10/28.    HPI/Subjective: Feels well, less abd pain, no N/V Anxious to go home  Objective: Filed Vitals:   01/31/15 0641  BP: 94/53  Pulse: 103  Temp: 98 F (36.7 C)  Resp: 18    Intake/Output Summary (Last 24 hours) at 01/31/15 1201 Last data filed at 01/30/15 1722  Gross per 24 hour  Intake    100 ml  Output      0 ml  Net  100 ml   Filed Weights   01/20/15 1744  Weight: 60.056 kg (132 lb 6.4 oz)    Exam:   General:  Alert afebrile comfortable.   Cardiovascular: s1s2  Respiratory: ctab,   Abdomen: soft nt ND BS+, biliary drain  Musculoskeletal: no pedal edema.   Data Reviewed: Basic Metabolic  Panel:  Recent Labs Lab 01/28/15 0350 01/29/15 0454 01/29/15 0838 01/30/15 0520 01/31/15 0425  NA 139 QUESTIONABLE RESULTS, RECOMMEND RECOLLECT TO VERIFY 137 140 136  K 3.1* QUESTIONABLE RESULTS, RECOMMEND RECOLLECT TO VERIFY 3.2* 3.2* 3.2*  CL 108 QUESTIONABLE RESULTS, RECOMMEND RECOLLECT TO VERIFY 106 109 105  CO2 26 QUESTIONABLE RESULTS, RECOMMEND RECOLLECT TO VERIFY 24 27 26   GLUCOSE 88 QUESTIONABLE RESULTS, RECOMMEND RECOLLECT TO VERIFY 115* 90 94  BUN <5* QUESTIONABLE RESULTS, RECOMMEND RECOLLECT TO VERIFY <5* 7 9  CREATININE 1.15* QUESTIONABLE RESULTS, RECOMMEND RECOLLECT TO VERIFY 1.10* 1.07* 1.09*  CALCIUM 8.1* QUESTIONABLE RESULTS, RECOMMEND RECOLLECT TO VERIFY 8.1* 7.9* 7.7*   Liver Function Tests:  Recent Labs Lab 01/28/15 0350  AST 20  ALT 10*  ALKPHOS 67  BILITOT 0.8  PROT 6.5  ALBUMIN 2.2*   No results for input(s): LIPASE, AMYLASE in the last 168 hours. No results for input(s): AMMONIA in the last 168 hours. CBC:  Recent Labs Lab 01/28/15 0350 01/29/15 0838 01/30/15 0520 01/30/15 1105 01/31/15 0425  WBC 6.1 6.1 5.0 8.5 5.5  HGB 7.5* 7.9* 6.2* 8.2* 6.9*  HCT 22.4* 24.1* 18.8* 25.1* 21.4*  MCV 82.1 81.7 81.7 82.8 82.3  PLT 158 145* 136* 173 141*   Cardiac Enzymes: No results for input(s): CKTOTAL, CKMB, CKMBINDEX, TROPONINI in the last 168 hours. BNP (last 3 results)  Recent Labs  01/06/15 1450  BNP 108.2*    ProBNP (last 3 results) No results for input(s): PROBNP in the last 8760 hours.  CBG: No results for input(s): GLUCAP in the last 168 hours.  Recent Results (from the past 240 hour(s))  Culture, blood (routine x 2)     Status: None   Collection Time: 01/25/15  6:49 PM  Result Value Ref Range Status   Specimen Description BLOOD RIGHT HAND  Final   Special Requests IN PEDIATRIC BOTTLE 3CC  Final   Culture   Final    NO GROWTH 5 DAYS Performed at [facility]    Report Status 01/30/2015 FINAL  Final  Culture, blood  (routine x 2)     Status: None   Collection Time: 01/25/15  6:49 PM  Result Value Ref Range Status   Specimen Description BLOOD LEFT ANTECUBITAL  Final   Special Requests BOTTLES DRAWN AEROBIC AND ANAEROBIC 10CC EA  Final   Culture   Final    NO GROWTH 5 DAYS Performed at [facility]    Report Status 01/30/2015 FINAL  Final  C difficile quick scan w PCR reflex     Status: None   Collection Time: 01/30/15 10:34 PM  Result Value Ref Range Status   C Diff antigen NEGATIVE NEGATIVE Final   C Diff toxin NEGATIVE NEGATIVE Final   C Diff interpretation Negative for toxigenic C. difficile  Final     Studies: Dg Abd 1 View  01/30/2015  CLINICAL DATA:  44 year old female with a history of abdominal pain. EXAM: ABDOMEN - 1 VIEW COMPARISON:  01/29/2015 FINDINGS: Compared to prior, there has been evacuation of residual enteric contrast within the colon, with no retained enteric contrast. No abnormally distended small bowel or colon. Pigtail drainage  catheter in the right upper quadrant unchanged. Surgical clips of the right upper quadrant. No unexpected calcifications or radiopaque foreign body. IMPRESSION: Interval evacuation of enteric contrast from the colon with no residual contrast. Surgical drain unchanged in place within the right upper quadrant. Signed, Dulcy Fanny. Earleen Newport, DO [facility] Electronically Signed   By: Corrie Mckusick D.O.   On: 01/30/2015 09:00   Ct Abdomen Pelvis W Contrast  01/30/2015  CLINICAL DATA:  Sepsis. Status post partial gastrectomy for metastatic gastric carcinoma. Last chemotherapy in the first week of October of this year. Recent external biliary drain for obstruction. Acute renal failure. Vomiting. EXAM: CT ABDOMEN AND PELVIS WITH CONTRAST TECHNIQUE: Multidetector CT imaging of the abdomen and pelvis was performed using the standard protocol following bolus administration of intravenous contrast. CONTRAST:   70mL OMNIPAQUE IOHEXOL 300 MG/ML SOLN, 155mL OMNIPAQUE IOHEXOL 300 MG/ML SOLN COMPARISON:  Previous examinations, including the abdomen and pelvis CT dated 01/06/2015. FINDINGS: Lower chest:  Bibasilar atelectasis with mild progression. Hepatobiliary: Significantly improved pericholecystic fluid and edema. A percutaneous biliary drainage catheter remains in place. Intrahepatic biliary ductal dilatation has not changed significantly. Pancreas: No significant change in diffuse pancreatic ductal dilatation with a maximum diameter of 3 mm on image 10. Spleen: Stable small oval area of low density laterally. Adrenals/Urinary Tract: Moderate dilatation of the right renal collecting system and proximal ureter with mild progression. There is abnormal soft tissue density in the proximal ureter, measuring 1.6 x 1.3 cm on image number 49 with an appearance compatible with no inhomogeneous enhancement. On coronal image number 66, this extends along an approximately 8 cm long segment of the ureter. Unremarkable left kidney, left ureter and urinary bladder. Stomach/Bowel: Previously noted partial gastrectomy changes. Left mid abdominal bowel anastomosis. No dilated small bowel loops. Oral contrast in normal caliber colon. No evidence of appendicitis. Vascular/Lymphatic: Multiple mildly prominent retroperitoneal lymph nodes without significant change. No abnormal enlarged lymph nodes. Reproductive: Enlarged, heterogeneous uterus. Enlarged, heterogeneous left ovary containing a 3.8 cm cyst without significant change. Unremarkable right ovary. Other: Small amount of free peritoneal fluid in the pelvis. Further decrease in mild anterior abdominal wall soft tissue thickening with a 4.3 x 1.2 cm residual area of midline soft tissue thickening on image number 41. This is in the a midline surgical scar. Musculoskeletal:  No suspicious bone lesions identified. IMPRESSION: 1. Mildly progressive right hydronephrosis due to an elongated  obstructing mass or area of marked inflammation and infection in the proximal to mid right ureter. 2. Further decrease in size of the previously demonstrated mass in the midline along the anterior abdominal wall. 3. Stable enlarged, heterogeneous uterus compatible with multiple fibroids. 4. Stable enlarged, heterogeneous left ovary containing a 3.8 cm cyst. 5. Small amount of free peritoneal fluid in the pelvis. 6. Grossly stable biliary ductal dilatation and pancreatic ductal dilatation. 7. Mildly progressive bibasilar atelectasis. Electronically Signed   By: Claudie Revering M.D.   On: 01/30/2015 17:49    Scheduled Meds: . ampicillin-sulbactam (UNASYN) IV  3 g Intravenous Q6H  . enoxaparin (LOVENOX) injection  40 mg Subcutaneous Q24H  . feeding supplement (PRO-STAT SUGAR FREE 64)  30 mL Oral TID BM  . ferrous sulfate  325 mg Oral BID WC  . lactose free nutrition  237 mL Oral TID WC  . morphine  15 mg Oral Q12H  . 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

## 2015-01-31 NOTE — Consult Note (Signed)
Consult: right hydronephrosis Requested by: Dr. Domenic Polite    Chief Complaint: right hydronephrosis   History of Present Illness: Andrea Best with metastatic gastric cancer s/p surgery and now getting chemo for Aug 2016 CT abdomen/pelvis which showed significant progression of metastatic disease with enlarging metastasis within the anterior abdominal wall, enlargement of multiple peritoneal implants, interval development of masses within the pancreatic head and caudate lobe of the liver. 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 New complex solid and cystic left adnexal lesion.  Placement of percutaneous internal/external biliary drain 11/19/2014  Cycle 1 FOLFOX 12/09/2014  Cycle 2 FOLFOX 12/23/2014 - Dr. Benay Spice   CT Oct 2016 showed a decrease in the anterior abdominal wall tumor, but an area developing around the right proximal ureter and the beginning of right hydronephrosis.   Andrea Best was readmitted after recent admission for E COLI and klebsiella sepsis still on ciprofloxacin for fevers, hypotension, fatigue and vomiting. Andrea Best had a repeat CT 01/30/2015 which showed increase in mass surrounding right proximal ureter, increase in right hydronephrosis with delayed enhancement and excretion from the right kidney. I reviewed all the CT images. Andrea Best is currently on abx and her urine cx grew low count of mixed growth.   Today, Andrea Best is c/o of no specific flank pain or dysuria. Her kidney function is normal, but Andrea Best did have a drop in kidney function with this episode.     Past Medical History  Diagnosis Date  . Eczema   . Lupus (HCC)     joint pain and extreme fatique - improved after plaquenil and prednisone therapy  . Sjogren's disease ([place])     caused Andrea Best to loose her teeth due to dry mouth;  prone to eye irritation and infection due to dry eye; affects immune system - PRONE TO INFECTIONS   . Allergy   .  Tachycardia   . Protein-calorie malnutrition, severe ([place])   . Abnormal findings on esophagogastroduodenoscopy (EGD) 09/22/13    esophagitis  . Asthma     no recent flare ups - no inhalers - as of 11/19/13  . GERD (gastroesophageal reflux disease)   . Anemia     iron deficiency   . Pneumonia     health care -associated July 2015 post op gastrectomy surgery  . Gastric cancer ([place]) 09/22/13 &10/10/13    adenocarcinoma/metastatic  FIRST CHEMO WAS 11/14/13 - NEXT CHEMO TREATMENT IS 11/21/13.   Past Surgical History  Procedure Laterality Date  . Cesarean section      1997  . Laparotomy N/A 10/10/2013    Procedure: SUBTOTAL GASTRECTOMY WITH GASTRIC JEJUNOSOTMY;  Surgeon: Edward Jolly, MD;  Location: [facility];  Service: General;  Laterality: N/A;  . Portacath placement Right 11/20/2013    Procedure: INSERTION PORT-A-CATH with ULTRA SOUND;  Surgeon: Adin Hector, MD;  Location: [facility];  Service: General;  Laterality: Right;    Home Medications:  Prescriptions prior to admission  Medication Sig Dispense Refill Last Dose  . Artificial Tear Ointment (DRY EYES OP) Apply 2 drops to eye daily as needed (dry eyes).   01/19/2015 at Unknown time  . ciprofloxacin (CIPRO) 750 MG tablet Take 1 tablet (750 mg total) by mouth 2 (two) times daily. 24 tablet 0 01/19/2015 at Unknown time  . dexamethasone (DECADRON) 4 MG tablet Take 2 tablets (8 mg total) by mouth 2 (two) times daily. Take 8 mg twice a day for two days beginning the day after  chemotherapy. 16 tablet 1 12/23/2014  . DiphenhydrAMINE HCl (ZZZQUIL) 50 MG/30ML LIQD Take 30 mLs by mouth at bedtime as needed (for sleep).   01/18/2015  . feeding supplement (BOOST / RESOURCE BREEZE) LIQD Take 1 Container by mouth 2 (two) times daily between meals. (Patient taking differently: Take 1 Container by mouth daily. )  0 01/19/2015 at Unknown time  . FERROUS SULFATE PO Take 1 tablet by mouth daily.   01/19/2015 at Unknown time  . hydroxychloroquine  (PLAQUENIL) 200 MG tablet Take 1 tablet (200 mg total) by mouth 2 (two) times daily.  0 01/19/2015 at Unknown time  . ibuprofen (ADVIL,MOTRIN) 200 MG tablet Take 200-600 mg by mouth every 6 (six) hours as needed for headache or moderate pain.   unknown  . ondansetron (ZOFRAN) 4 MG tablet Take 1 tablet (4 mg total) by mouth every 8 (eight) hours as needed for nausea or vomiting. 20 tablet 3 01/18/2015  . PRESCRIPTION MEDICATION Chemo - [facility]   Past Month at Unknown time  . [DISCONTINUED] morphine (MS CONTIN) 15 MG 12 hr tablet Take 1 tablet (15 mg total) by mouth every 12 (twelve) hours. (Patient taking differently: Take 15 mg by mouth at bedtime. ) 60 tablet 0 01/20/2015 at Unknown time  . LORazepam (ATIVAN) 0.5 MG tablet Take 1 tablet (0.5 mg total) by mouth every 8 (eight) hours as needed for sleep (nausea). 30 tablet 0 never filled  . TPN ADULT Inject into the vein continuous. [facility] [place]   2 weeks   Allergies:  Allergies  Allergen Reactions  . Compazine [Prochlorperazine Maleate] Other (See Comments)    Dystonia  . Azithromycin Rash  . Sulfa Antibiotics Rash    hallucinations  . Hydrocodone-Acetaminophen Nausea And Vomiting    Hallucinating   . 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 Andrea Best DOES NOT WANT ANY BLOOD PRODUCTS OR FRACTIONS    Family History  Problem Relation Age of Onset  . Hypertension Mother   . Diabetes Mother   . Diabetes Maternal Grandmother   . Diabetes Maternal Grandfather    Social History:  reports that Andrea Best has never smoked. Andrea Best has never used smokeless tobacco. Andrea Best reports that Andrea Best does not drink alcohol or use illicit drugs.  ROS: A complete review of systems was performed.  All systems are negative except for pertinent findings as noted. ROS   Physical Exam:  Vital signs in last 24 hours: Temp:  [98 F (36.7 C)-99.8 F (37.7 C)] 98 F (36.7 C) (11/06 0641) Pulse Rate:  [103-117] 103 (11/06 0641) Resp:  [18] 18 (11/06 0641) BP:  (94-109)/(53-66) 94/53 mmHg (11/06 0641) SpO2:  [100 %] 100 % (11/06 0641) General:  Alert and oriented, No acute distress Neurologic: Grossly intact  Laboratory Data:  Results for orders placed or performed during the hospital encounter of 01/20/15 (from the past 24 hour(s))  C difficile quick scan w PCR reflex     Status: None   Collection Time: 01/30/15 10:34 PM  Result Value Ref Range   C Diff antigen NEGATIVE NEGATIVE   C Diff toxin NEGATIVE NEGATIVE   C Diff interpretation Negative for toxigenic C. difficile   CBC     Status: Abnormal   Collection Time: 01/31/15  4:25 AM  Result Value Ref Range   WBC 5.5 4.0 - 10.5 K/uL   RBC 2.60 (L) 3.87 - 5.11 MIL/uL   Hemoglobin 6.9 (LL) 12.0 - 15.0 g/dL   HCT 21.4 (L)  36.0 - 46.0 %   MCV 82.3 78.0 - 100.0 fL   MCH 26.5 26.0 - 34.0 pg   MCHC 32.2 30.0 - 36.0 g/dL   RDW 17.2 (H) 11.5 - 15.5 %   Platelets 141 (L) 150 - 400 K/uL  Basic metabolic panel     Status: Abnormal   Collection Time: 01/31/15  4:25 AM  Result Value Ref Range   Sodium 136 135 - 145 mmol/L   Potassium 3.2 (L) 3.5 - 5.1 mmol/L   Chloride 105 101 - 111 mmol/L   CO2 26 22 - 32 mmol/L   Glucose, Bld 94 65 - 99 mg/dL   BUN 9 6 - 20 mg/dL   Creatinine, Ser 1.09 (H) 0.44 - 1.00 mg/dL   Calcium 7.7 (L) 8.9 - 10.3 mg/dL   GFR calc non Af Amer >60 >60 mL/min   GFR calc Af Amer >60 >60 mL/min   Anion gap 5 5 - 15   Recent Results (from the past 240 hour(s))  Culture, blood (routine x 2)     Status: None   Collection Time: 01/25/15  6:49 PM  Result Value Ref Range Status   Specimen Description BLOOD RIGHT HAND  Final   Special Requests IN PEDIATRIC BOTTLE 3CC  Final   Culture   Final    NO GROWTH 5 DAYS Performed at [facility]    Report Status 01/30/2015 FINAL  Final  Culture, blood (routine x 2)     Status: None   Collection Time: 01/25/15  6:49 PM  Result Value Ref Range Status   Specimen Description BLOOD LEFT ANTECUBITAL  Final   Special Requests  BOTTLES DRAWN AEROBIC AND ANAEROBIC 10CC EA  Final   Culture   Final    NO GROWTH 5 DAYS Performed at [facility]    Report Status 01/30/2015 FINAL  Final  C difficile quick scan w PCR reflex     Status: None   Collection Time: 01/30/15 10:34 PM  Result Value Ref Range Status   C Diff antigen NEGATIVE NEGATIVE Final   C Diff toxin NEGATIVE NEGATIVE Final   C Diff interpretation Negative for toxigenic C. difficile  Final   Creatinine:  Recent Labs  01/26/15 0305 01/27/15 0425 01/28/15 0350 01/29/15 0454 01/29/15 0838 01/30/15 0520 01/31/15 0425  CREATININE 1.17* 1.11* 1.15* QUESTIONABLE RESULTS, RECOMMEND RECOLLECT TO VERIFY 1.10* 1.07* 1.09*    Impression/Assessment:  Right hydronephrosis from likely metastatic implant although it is odd the anterior abdominal wall soft tissue thickening has improved after chemo but this area has progressed. Possible it is some form of fibrosis. It has progressed and the right kidney is obstructed but her overall kidney function is normal.    Plan:  I discussed with the patient and her family the CT findings and the nature, potential benefits, risks and alternatives to cystoscopy, right RGP, right ureteral stent placement, including side effects of the proposed treatment, the likelihood of the patient achieving the goals of the procedure, and any potential problems that might occur during the procedure or recuperation. We discussed other options such as surveillance of the right hydro and her kidney function and right Perc Nx tube. We discussed stent pain, UTI and need for periodic stent changes among other risks of stents. All questions answered. Andrea Best wants to proceed with ureteral stent while Andrea Best is stable and because the area has progressed. Andrea Best wants to maximize kidney function. Patient elects to proceed with right RGP and right ureteral  stent placement. We discussed possible failure to gain RG access and need to consider right PC Nx and  Andrea Best said Andrea Best would not want another external drain. We discussed considering right URS at the first stent change in 6 - 8 weeks. I discussed with patient I am at [facility] tomorrow and it may be one of my associates performing the stent placement.     Andrea Best 01/31/2015, 11:51 AM

## 2015-01-31 NOTE — Progress Notes (Signed)
C Diff antigen NEGATIVE  NEGATIVE   C Diff toxin NEGATIVE  NEGATIVE   C Diff interpretation  Negative for toxigenic C. difficile       SRP, RN

## 2015-02-01 ENCOUNTER — Encounter (HOSPITAL_COMMUNITY)
Admission: AD | Disposition: A | Discharge status: Home / Self Care | Payer: Self-pay | Source: Ambulatory Visit | Attending: Internal Medicine

## 2015-02-01 ENCOUNTER — Inpatient Hospital Stay (HOSPITAL_COMMUNITY): Payer: 59 | Admitting: Anesthesiology

## 2015-02-01 ENCOUNTER — Encounter (HOSPITAL_COMMUNITY): Payer: Self-pay | Admitting: Urology

## 2015-02-01 HISTORY — PX: CYSTOSCOPY W/ URETERAL STENT PLACEMENT: SHX1429

## 2015-02-01 LAB — BASIC METABOLIC PANEL
ANION GAP: 6 (ref 5–15)
BUN: 11 mg/dL (ref 6–20)
CALCIUM: 8 mg/dL — AB (ref 8.9–10.3)
CO2: 28 mmol/L (ref 22–32)
Chloride: 102 mmol/L (ref 101–111)
Creatinine, Ser: 1.14 mg/dL — ABNORMAL HIGH (ref 0.44–1.00)
GFR calc Af Amer: >60 mL/min (ref >60)
GFR calc non Af Amer: 58 mL/min — ABNORMAL LOW (ref >60)
GLUCOSE: 90 mg/dL (ref 65–99)
POTASSIUM: 3.4 mmol/L — AB (ref 3.5–5.1)
Sodium: 136 mmol/L (ref 135–145)

## 2015-02-01 LAB — CBC
HEMATOCRIT: 22.7 % — AB (ref 36.0–46.0)
Hemoglobin: 7.4 g/dL — ABNORMAL LOW (ref 12.0–15.0)
MCH: 26.7 pg (ref 26.0–34.0)
MCHC: 32.6 g/dL (ref 30.0–36.0)
MCV: 81.9 fL (ref 78.0–100.0)
Platelets: 147 10*3/uL — ABNORMAL LOW (ref 150–400)
RBC: 2.77 MIL/uL — ABNORMAL LOW (ref 3.87–5.11)
RDW: 16.9 % — AB (ref 11.5–15.5)
WBC: 6.3 10*3/uL (ref 4.0–10.5)

## 2015-02-01 SURGERY — CYSTOSCOPY, WITH RETROGRADE PYELOGRAM AND URETERAL STENT INSERTION
Anesthesia: General | Laterality: Right

## 2015-02-01 MED ORDER — ONDANSETRON HCL 4 MG/2ML IJ SOLN
INTRAMUSCULAR | Status: AC
  Filled 2015-02-01: qty 2

## 2015-02-01 MED ORDER — LIDOCAINE HCL (CARDIAC) 20 MG/ML IV SOLN
INTRAVENOUS | Status: DC | PRN
  Administered 2015-02-01: 80 mg via INTRAVENOUS

## 2015-02-01 MED ORDER — SUCCINYLCHOLINE CHLORIDE 20 MG/ML IJ SOLN
INTRAMUSCULAR | Status: DC | PRN
  Administered 2015-02-01: 100 mg via INTRAVENOUS

## 2015-02-01 MED ORDER — IOHEXOL 300 MG/ML  SOLN
INTRAMUSCULAR | Status: DC | PRN
  Administered 2015-02-01: 12 mL

## 2015-02-01 MED ORDER — MIDAZOLAM HCL 2 MG/2ML IJ SOLN: 0.5000 mg | Freq: Once | INTRAMUSCULAR | Status: DC | PRN

## 2015-02-01 MED ORDER — LACTATED RINGERS IV SOLN
INTRAVENOUS | Status: DC
  Administered 2015-02-01: 1000 mL via INTRAVENOUS

## 2015-02-01 MED ORDER — MEPERIDINE HCL 25 MG/ML IJ SOLN: 6.2500 mg | INTRAMUSCULAR | Status: DC | PRN

## 2015-02-01 MED ORDER — ONDANSETRON HCL 4 MG/2ML IJ SOLN
INTRAMUSCULAR | Status: DC | PRN
  Administered 2015-02-01: 4 mg via INTRAVENOUS

## 2015-02-01 MED ORDER — PROMETHAZINE HCL 25 MG/ML IJ SOLN
6.2500 mg | INTRAMUSCULAR | Status: DC | PRN
  Administered 2015-02-04: 12.5 mg via INTRAVENOUS
  Filled 2015-02-01: qty 1

## 2015-02-01 MED ORDER — PROPOFOL 10 MG/ML IV BOLUS
INTRAVENOUS | Status: AC
  Filled 2015-02-01: qty 20

## 2015-02-01 MED ORDER — SODIUM CHLORIDE 0.9 % IR SOLN
Status: DC | PRN
  Administered 2015-02-01: 1000 mL

## 2015-02-01 MED ORDER — SODIUM CHLORIDE 0.9 % IR SOLN
Status: DC | PRN
  Administered 2015-02-01: 4000 mL

## 2015-02-01 MED ORDER — FENTANYL CITRATE (PF) 250 MCG/5ML IJ SOLN
INTRAMUSCULAR | Status: DC | PRN
  Administered 2015-02-01 (×2): 50 ug via INTRAVENOUS

## 2015-02-01 MED ORDER — HYDROMORPHONE HCL 1 MG/ML IJ SOLN: 0.2500 mg | INTRAMUSCULAR | Status: DC | PRN

## 2015-02-01 MED ORDER — LIDOCAINE HCL (CARDIAC) 20 MG/ML IV SOLN
INTRAVENOUS | Status: AC
Start: 2015-02-01 — End: 2015-02-01
  Filled 2015-02-01: qty 5

## 2015-02-01 MED ORDER — FENTANYL CITRATE (PF) 100 MCG/2ML IJ SOLN
INTRAMUSCULAR | Status: AC
  Filled 2015-02-01: qty 4

## 2015-02-01 MED ORDER — POTASSIUM CHLORIDE CRYS ER 20 MEQ PO TBCR
40.0000 meq | EXTENDED_RELEASE_TABLET | Freq: Once | ORAL | Status: DC
  Filled 2015-02-01: qty 2

## 2015-02-01 MED ORDER — PROPOFOL 10 MG/ML IV BOLUS
INTRAVENOUS | Status: DC | PRN
  Administered 2015-02-01: 140 mg via INTRAVENOUS

## 2015-02-01 SURGICAL SUPPLY — 26 items
BAG URO CATCHER STRL LF (DRAPE) ×3 IMPLANT
BASKET LASER NITINOL 1.9FR (BASKET) IMPLANT
BASKET STNLS GEMINI 4WIRE 3FR (BASKET) IMPLANT
BASKET ZERO TIP NITINOL 2.4FR (BASKET) IMPLANT
BRUSH URET BIOPSY 3F (UROLOGICAL SUPPLIES) IMPLANT
CATH INTERMIT  6FR 70CM (CATHETERS) IMPLANT
CLOTH BEACON ORANGE TIMEOUT ST (SAFETY) ×3 IMPLANT
FIBER LASER FLEXIVA 1000 (UROLOGICAL SUPPLIES) IMPLANT
FIBER LASER FLEXIVA 200 (UROLOGICAL SUPPLIES) IMPLANT
FIBER LASER FLEXIVA 365 (UROLOGICAL SUPPLIES) IMPLANT
FIBER LASER FLEXIVA 550 (UROLOGICAL SUPPLIES) IMPLANT
FIBER LASER TRAC TIP (UROLOGICAL SUPPLIES) IMPLANT
GLOVE BIOGEL M STRL SZ7.5 (GLOVE) ×3 IMPLANT
GOWN STRL REUS W/TWL XL LVL3 (GOWN DISPOSABLE) ×3 IMPLANT
GUIDEWIRE ANG ZIPWIRE 038X150 (WIRE) IMPLANT
GUIDEWIRE STR DUAL SENSOR (WIRE) IMPLANT
IV NS IRRIG 3000ML ARTHROMATIC (IV SOLUTION) ×6 IMPLANT
KIT BALLIN UROMAX 15FX10 (LABEL) IMPLANT
KIT BALLN UROMAX 15FX4 (MISCELLANEOUS) IMPLANT
KIT BALLN UROMAX 26 75X4 (MISCELLANEOUS)
PACK CYSTO (CUSTOM PROCEDURE TRAY) ×6 IMPLANT
SET HIGH PRES BAL DIL (LABEL)
SHEATH ACCESS URETERAL 24CM (SHEATH) IMPLANT
SHEATH ACCESS URETERAL 54CM (SHEATH) IMPLANT
STENT CONTOUR 6FRX26X.038 (STENTS) ×3 IMPLANT
SYRINGE IRR TOOMEY STRL 70CC (SYRINGE) IMPLANT

## 2015-02-01 NOTE — Transfer of Care (Signed)
Immediate Anesthesia Transfer of Care Note  Patient: Andrea Best  Procedure(s) Performed: Procedure(s): CYSTOSCOPY WITH RETROGRADE PYELOGRAM/URETERAL STENT PLACEMENT (Right)  Patient Location: PACU  Anesthesia Type:General  Level of Consciousness: awake, alert , oriented and patient cooperative  Airway & Oxygen Therapy: Patient Spontanous Breathing and Patient connected to face mask oxygen  Post-op Assessment: Report given to RN and Post -op Vital signs reviewed and stable  Post vital signs: Reviewed and stable  Last Vitals:  Filed Vitals:   02/01/15 1533  BP:   Pulse:   Temp: 37.4 C  Resp:     Complications: No apparent anesthesia complications

## 2015-02-01 NOTE — Anesthesia Postprocedure Evaluation (Signed)
  Anesthesia Post-op Note  Patient: Andrea Best  Procedure(s) Performed: Procedure(s): CYSTOSCOPY WITH RETROGRADE PYELOGRAM/URETERAL STENT PLACEMENT (Right)  Patient Location: PACU  Anesthesia Type:General  Level of Consciousness: sedated, patient cooperative and responds to stimulation and voice  Airway and Oxygen Therapy: Patient Spontanous Breathing  Post-op Pain: mild, able to sleep  Post-op Assessment: Post-op Vital signs reviewed, Patient's Cardiovascular Status Stable, Respiratory Function Stable, Patent Airway, No signs of Nausea or vomiting and Pain level controlled              Post-op Vital Signs: Reviewed and stable  Last Vitals:  Filed Vitals:   02/01/15 1614  BP:   Pulse: 95  Temp: 37.2 C  Resp: 12    Complications: No apparent anesthesia complications

## 2015-02-01 NOTE — Anesthesia Preprocedure Evaluation (Addendum)
Anesthesia Evaluation  Patient identified by MRN, date of birth, ID band Patient awake    Reviewed: Allergy & Precautions, NPO status , Patient's Chart, lab work & pertinent test results  History of Anesthesia Complications Negative for: history of anesthetic complications  Airway Mallampati: I  TM Distance: >3 FB Neck ROM: Full    Dental  (+) Edentulous Upper, Edentulous Lower   Pulmonary asthma ,    breath sounds clear to auscultation       Cardiovascular negative cardio ROS   Rhythm:Regular Rate:Normal     Neuro/Psych negative neurological ROS     GI/Hepatic Neg liver ROS, GERD  Medicated and Controlled,N/V with present ureteral stone H/o gastric cancer: chemo   Endo/Other  Lupus Sjogren's  Renal/GU Ureteral stone     Musculoskeletal negative musculoskeletal ROS (+)   Abdominal   Peds  Hematology  (+) Blood dyscrasia (Hb 7.4), , Pt is Jehovah's and declines blood products, will accept albumin   Anesthesia Other Findings   Reproductive/Obstetrics LMP 2 months ago, however pt denies sexual relations within the last 11 years                           Anesthesia Physical Anesthesia Plan  ASA: III  Anesthesia Plan: General   Post-op Pain Management:    Induction: Intravenous and Rapid sequence  Airway Management Planned: Oral ETT  Additional Equipment:   Intra-op Plan:   Post-operative Plan: Extubation in OR  Informed Consent: I have reviewed the patients History and Physical, chart, labs and discussed the procedure including the risks, benefits and alternatives for the proposed anesthesia with the patient or authorized representative who has indicated his/her understanding and acceptance.     Plan Discussed with: CRNA and Surgeon  Anesthesia Plan Comments: (Plan routine monitors, GETA)        Anesthesia Quick Evaluation

## 2015-02-01 NOTE — Progress Notes (Signed)
Pt without complaints.   Filed Vitals:   02/01/15 1002  BP: 114/62  Pulse: 115  Temp: 98.5 F (36.9 C)  Resp: 20   Labs stable.   A/P - -right hydronephrosis - discussed again nature, r/b/a to cysto, right RGP, right ureteral stent placement. She is stable to proceed.

## 2015-02-01 NOTE — Anesthesia Procedure Notes (Signed)
Procedure Name: Intubation Date/Time: 02/01/2015 2:56 PM Performed by: Dione Booze Pre-anesthesia Checklist: Emergency Drugs available, Suction available, Patient being monitored and Patient identified Patient Re-evaluated:Patient Re-evaluated prior to inductionOxygen Delivery Method: Circle system utilized Preoxygenation: Pre-oxygenation with 100% oxygen Intubation Type: IV induction, Rapid sequence and Cricoid Pressure applied Grade View: Grade I Tube type: Oral Tube size: 7.5 mm Number of attempts: 1 Airway Equipment and Method: Stylet Placement Confirmation: ETT inserted through vocal cords under direct vision and positive ETCO2 Secured at: 22 cm Tube secured with: Tape Dental Injury: Teeth and Oropharynx as per pre-operative assessment

## 2015-02-01 NOTE — Op Note (Signed)
Preoperative diagnosis: Right hydronephrosis Postoperative diagnosis: Right hydronephrosis, right ureteral compression  Procedure: Cystoscopy, right retrograde pyelogram, right ureteral stent placement  Surgeon: Junious Silk  Anesthesia: Gen.  Indication for procedure: 45 year old with history of metastatic gastric adenocarcinoma with progressive right hydronephrosis and obstruction from a periureteral mass. She was brought for right retrograde pyelogram right ureteral stent placement after considering the nature risks benefits and alternatives.  Findings: On cystoscopy the urethra and the bladder were unremarkable.  Right retrograde pyelogram-this outlined a single ureter, single collecting system unit with narrowing of the ureter from the middle of the mid ureter up to the middle of the proximal ureter. Proximal to this there was dilation of the renal pelvis and collecting system. There were no filling defects.  Description of procedure: After consent was obtained patient brought to the operating room. After adequate anesthesia she was placed in lithotomy position and prepped and draped in the usual sterile fashion. A timeout was performed to confirm the patient and procedure. The cystoscope was passed per urethra and the bladder inspected. Right ureteral orifice was cannulated with a 6 [place] open-ended catheter and retrograde injection of contrast was performed. A sensor wire was then advanced and coiled in the upper pole collecting system. Over the sensor wire 6 [place] open-ended catheter was passed into the proximal ureters and then renal pelvis. The wire was removed and there was a good hydronephrotic drip. Urine was sent for culture. More contrast was injected retrograde to confirm proper placement which outlined a dilated collecting system. The wire was then repassed and coiled in the upper pole collecting system and the 6 [place] open-ended catheter removed. The wire was backloaded on the  cystoscope and a 6 x 26 cm stent was advanced. The wire was removed with a good coil reconstituting in the upper pole collecting system and a good coil in the bladder. The bladder was drained and the scope removed. She was awakened and taken to the recovery room in stable condition.  Complications: None  Blood loss: Minimal  Specimens: Right kidney urine for culture  Drains: 6 x 26 cm right ureteral stent  Disposition: Patient stable to PACU

## 2015-02-01 NOTE — Discharge Instructions (Signed)
Ureteral Stent Implantation, Care After Refer to this sheet in the next few weeks. These instructions provide you with information on caring for yourself after your procedure. Your health care provider may also give you more specific instructions. Your treatment has been planned according to current medical practices, but problems sometimes occur. Call your health care provider if you have any problems or questions after your procedure. WHAT TO EXPECT AFTER THE PROCEDURE You should be back to normal activity within 48 hours after the procedure. Nausea and vomiting may occur and are commonly the result of anesthesia. It is common to experience sharp pain in the back or lower abdomen and penis with voiding. This is caused by movement of the ends of the stent with the act of urinating.It usually goes away within minutes after you have stopped urinating. HOME CARE INSTRUCTIONS Make sure to drink plenty of fluids. You may have small amounts of bleeding, causing your urine to be red. This is normal. Certain movements may trigger pain or a feeling that you need to urinate. You may be given medicines to prevent infection or bladder spasms. Be sure to take all medicines as directed. Only take over-the-counter or prescription medicines for pain, discomfort, or fever as directed by your health care provider.   Your stent will be left in until the blockage is resolved. The stent will need to be changed in 6 - 8 weeks. The stent can be removed by your health care provider in the office. Be sure to keep all follow-up appointments so your health care provider can check that you are healing properly.  SEEK MEDICAL CARE IF:  You experience increasing pain.  Your pain medicine is not working. SEEK IMMEDIATE MEDICAL CARE IF:  Your urine is dark red or has blood clots.  You are leaking urine (incontinent).  You have a fever, chills, feeling sick to your stomach (nausea), or vomiting.  Your pain is not relieved  by pain medicine.  The end of the stent comes out of the urethra.  You are unable to urinate.   This information is not intended to replace advice given to you by your health care provider. Make sure you discuss any questions you have with your health care provider.   Document Released: 11/13/2012 Document Revised: 03/18/2013 Document Reviewed: 09/25/2014 Elsevier Interactive Patient Education [employer].

## 2015-02-01 NOTE — Progress Notes (Signed)
[facility] PROGRESS NOTE  Andrea Best QQI:[id number] DOB: 1970-04-23 DOA: 01/20/2015 PCP: Milagros Evener, MD Brief history: 44 year old lady with h/o gastric cancer s/p partial gastrectomy with metastatic disease, with last chemo therapy in the first week of October, and recent external biliary drain placement for obstruction on 9/29, presented to ED in the first week of October for sepsis. During the hospitalization she grew klebsiella and E coli in the blood and Enterobacter species ( CRE) from the biliary drain  (cultures were drawn from the biliary drain by IR). All of the bacteria were sensitive to ciprofloxacin and she was discharged on oral ciprofloxacin on 10/17 to complete the course.  She presents to cancer center on 10/26 for sepsis and acute renal failure from dehydration and this time her urine cultures grew 60,000 colonies resistant to ciprofloxacin, but sensitive to Augmentin and one blood culture growing klebsiella ,pan sensitive.   But because of her CRE, Dr.Akula requested Dr Linus Salmons for recommendations, started her on Avycaz. She has been on Avycaz from 10/28. ID Dr Tommy Medal consulted. Meanwhile during her hospitalization she has vomited at least once a day. She attributed it to having a full stomach.  Dr Learta Codding recommended upper GI series, unremarkable CT abd delayed pending-pending clearing of barium, showed increased Hydronephrosis and ureteral obstruction, Consulted Urology, for R ureteral stent today  Assessment/Plan: Sepsis  -improving, was febrile, hypotensive and tachycardic on admission, Lactic acid elevated.  -Repeat lactic acid normalized after fluid boluses.  -Blood cultures 10/26 grew klebsiella 1/2 . Urine cultures grew 60,000 e coli resistent to ciprofloxacin.  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   Malignant biliary obstruction  -s/p I/E biliary drain 11/19/14 with last exchange on 12/24/14; recent biliary sepsis -IR following and drain felt to be functioning well -drain care, FU with IR to make decision regarding biliary stent  Nausea/Vomiting, Abd pain -due to above, supportive care,  -improved, tolerating diet now -CT as discussed above -upper Gi series unremarkable  Sjogren's disease: plaquenil on hold.   Acute renal failure: -possibly from the hypotension and decreased renal perfusion. Improving with hydration. Cut down IVF.   Asthma; -no wheezing  DVT proph: lovenox  Code Status: full code. Family Communication: mother at bedside.  Disposition Plan: home post urethral stent   Consultants:  Oncology Dr Benay Spice.  ID Dr Tommy Medal  IR  Procedures: None  Antibiotics:  avycaz 10/28 till date.    vancomycin and zosyn -10/26 till 10/27  IV cipro on 10/27 to 10/28.    HPI/Subjective: Feels well, less abd pain, no N/V Waiting for procedure  Objective: Filed Vitals:   02/01/15 1002  BP: 114/62  Pulse: 115  Temp: 98.5 F (36.9 C)  Resp: 20    Intake/Output Summary (Last 24 hours) at 02/01/15 1435 Last data filed at 02/01/15 1037  Gross per 24 hour  Intake    700 ml  Output  340 ml  Net    360 ml   Filed Weights   01/20/15 1744  Weight: 60.056 kg (132 lb 6.4 oz)    Exam:   General:  Alert afebrile comfortable.   Cardiovascular: s1s2  Respiratory: ctab,   Abdomen:  soft nt ND BS+, biliary drain  Musculoskeletal: no pedal edema.   Data Reviewed: Basic Metabolic Panel:  Recent Labs Lab 01/29/15 0454 01/29/15 0838 01/30/15 0520 01/31/15 0425 02/01/15 0505  NA QUESTIONABLE RESULTS, RECOMMEND RECOLLECT TO VERIFY 137 140 136 136  K QUESTIONABLE RESULTS, RECOMMEND RECOLLECT TO VERIFY 3.2* 3.2* 3.2* 3.4*  CL QUESTIONABLE RESULTS, RECOMMEND RECOLLECT TO VERIFY 106 109 105 102  CO2 QUESTIONABLE RESULTS, RECOMMEND RECOLLECT TO VERIFY 24 27 26 28   GLUCOSE QUESTIONABLE RESULTS, RECOMMEND RECOLLECT TO VERIFY 115* 90 94 90  BUN QUESTIONABLE RESULTS, RECOMMEND RECOLLECT TO VERIFY <5* 7 9 11   CREATININE QUESTIONABLE RESULTS, RECOMMEND RECOLLECT TO VERIFY 1.10* 1.07* 1.09* 1.14*  CALCIUM QUESTIONABLE RESULTS, RECOMMEND RECOLLECT TO VERIFY 8.1* 7.9* 7.7* 8.0*   Liver Function Tests:  Recent Labs Lab 01/28/15 0350  AST 20  ALT 10*  ALKPHOS 67  BILITOT 0.8  PROT 6.5  ALBUMIN 2.2*   No results for input(s): LIPASE, AMYLASE in the last 168 hours. No results for input(s): AMMONIA in the last 168 hours. CBC:  Recent Labs Lab 01/29/15 0838 01/30/15 0520 01/30/15 1105 01/31/15 0425 02/01/15 0505  WBC 6.1 5.0 8.5 5.5 6.3  HGB 7.9* 6.2* 8.2* 6.9* 7.4*  HCT 24.1* 18.8* 25.1* 21.4* 22.7*  MCV 81.7 81.7 82.8 82.3 81.9  PLT 145* 136* 173 141* 147*   Cardiac Enzymes: No results for input(s): CKTOTAL, CKMB, CKMBINDEX, TROPONINI in the last 168 hours. BNP (last 3 results)  Recent Labs  01/06/15 1450  BNP 108.2*    ProBNP (last 3 results) No results for input(s): PROBNP in the last 8760 hours.  CBG: No results for input(s): GLUCAP in the last 168 hours.  Recent Results (from the past 240 hour(s))  Culture, blood (routine x 2)     Status: None   Collection Time: 01/25/15  6:49 PM  Result Value Ref Range Status   Specimen Description BLOOD RIGHT HAND  Final   Special Requests IN PEDIATRIC BOTTLE 3CC  Final   Culture   Final    NO GROWTH 5  DAYS Performed at [facility]    Report Status 01/30/2015 FINAL  Final  Culture, blood (routine x 2)     Status: None   Collection Time: 01/25/15  6:49 PM  Result Value Ref Range Status   Specimen Description BLOOD LEFT ANTECUBITAL  Final   Special Requests BOTTLES DRAWN AEROBIC AND ANAEROBIC 10CC EA  Final   Culture   Final    NO GROWTH 5 DAYS Performed at [facility]    Report Status 01/30/2015 FINAL  Final  C difficile quick scan w PCR reflex     Status: None   Collection Time: 01/30/15 10:34 PM  Result Value Ref Range Status   C Diff antigen NEGATIVE NEGATIVE Final   C Diff toxin NEGATIVE NEGATIVE Final   C Diff interpretation Negative for toxigenic C. difficile  Final     Studies: Ct Abdomen Pelvis W Contrast  01/30/2015  CLINICAL DATA:  Sepsis. Status post partial gastrectomy for metastatic gastric carcinoma. Last chemotherapy in the first week of October of this year. Recent external biliary drain for obstruction. Acute renal failure. Vomiting. EXAM: CT ABDOMEN AND PELVIS WITH CONTRAST  TECHNIQUE: Multidetector CT imaging of the abdomen and pelvis was performed using the standard protocol following bolus administration of intravenous contrast. CONTRAST:  47mL OMNIPAQUE IOHEXOL 300 MG/ML SOLN, 161mL OMNIPAQUE IOHEXOL 300 MG/ML SOLN COMPARISON:  Previous examinations, including the abdomen and pelvis CT dated 01/06/2015. FINDINGS: Lower chest:  Bibasilar atelectasis with mild progression. Hepatobiliary: Significantly improved pericholecystic fluid and edema. A percutaneous biliary drainage catheter remains in place. Intrahepatic biliary ductal dilatation has not changed significantly. Pancreas: No significant change in diffuse pancreatic ductal dilatation with a maximum diameter of 3 mm on image 10. Spleen: Stable small oval area of low density laterally. Adrenals/Urinary Tract: Moderate dilatation of the right renal collecting system and proximal ureter with mild  progression. There is abnormal soft tissue density in the proximal ureter, measuring 1.6 x 1.3 cm on image number 49 with an appearance compatible with no inhomogeneous enhancement. On coronal image number 66, this extends along an approximately 8 cm long segment of the ureter. Unremarkable left kidney, left ureter and urinary bladder. Stomach/Bowel: Previously noted partial gastrectomy changes. Left mid abdominal bowel anastomosis. No dilated small bowel loops. Oral contrast in normal caliber colon. No evidence of appendicitis. Vascular/Lymphatic: Multiple mildly prominent retroperitoneal lymph nodes without significant change. No abnormal enlarged lymph nodes. Reproductive: Enlarged, heterogeneous uterus. Enlarged, heterogeneous left ovary containing a 3.8 cm cyst without significant change. Unremarkable right ovary. Other: Small amount of free peritoneal fluid in the pelvis. Further decrease in mild anterior abdominal wall soft tissue thickening with a 4.3 x 1.2 cm residual area of midline soft tissue thickening on image number 41. This is in the a midline surgical scar. Musculoskeletal:  No suspicious bone lesions identified. IMPRESSION: 1. Mildly progressive right hydronephrosis due to an elongated obstructing mass or area of marked inflammation and infection in the proximal to mid right ureter. 2. Further decrease in size of the previously demonstrated mass in the midline along the anterior abdominal wall. 3. Stable enlarged, heterogeneous uterus compatible with multiple fibroids. 4. Stable enlarged, heterogeneous left ovary containing a 3.8 cm cyst. 5. Small amount of free peritoneal fluid in the pelvis. 6. Grossly stable biliary ductal dilatation and pancreatic ductal dilatation. 7. Mildly progressive bibasilar atelectasis. Electronically Signed   By: Claudie Revering M.D.   On: 01/30/2015 17:49    Scheduled Meds: . ampicillin-sulbactam (UNASYN) IV  3 g Intravenous Q6H  . darbepoetin (ARANESP) injection -  NON-DIALYSIS  100 mcg Subcutaneous Once  . enoxaparin (LOVENOX) injection  40 mg Subcutaneous Q24H  . feeding supplement (PRO-STAT SUGAR FREE 64)  30 mL Oral TID BM  . ferrous sulfate  325 mg Oral BID WC  . lactose free nutrition  237 mL Oral TID WC  . morphine  15 mg Oral Q12H  . multivitamin with minerals  1 tablet Oral Daily   Continuous Infusions: . 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

## 2015-02-02 ENCOUNTER — Ambulatory Visit: Payer: 59 | Admitting: Oncology

## 2015-02-02 ENCOUNTER — Other Ambulatory Visit: Payer: 59

## 2015-02-02 ENCOUNTER — Ambulatory Visit: Payer: 59

## 2015-02-02 DIAGNOSIS — Z960 Presence of urogenital implants: Secondary | ICD-10-CM

## 2015-02-02 DIAGNOSIS — M545 Low back pain: Secondary | ICD-10-CM

## 2015-02-02 DIAGNOSIS — R102 Pelvic and perineal pain: Secondary | ICD-10-CM

## 2015-02-02 DIAGNOSIS — C169 Malignant neoplasm of stomach, unspecified: Secondary | ICD-10-CM | POA: Insufficient documentation

## 2015-02-02 DIAGNOSIS — K8033 Calculus of bile duct with acute cholangitis with obstruction: Secondary | ICD-10-CM

## 2015-02-02 LAB — BASIC METABOLIC PANEL
ANION GAP: 7 (ref 5–15)
BUN: 11 mg/dL (ref 6–20)
CALCIUM: 7.9 mg/dL — AB (ref 8.9–10.3)
CHLORIDE: 101 mmol/L (ref 101–111)
CO2: 27 mmol/L (ref 22–32)
CREATININE: 1 mg/dL (ref 0.44–1.00)
GFR calc Af Amer: >60 mL/min (ref >60)
GFR calc non Af Amer: >60 mL/min (ref >60)
GLUCOSE: 116 mg/dL — AB (ref 65–99)
Potassium: 3.2 mmol/L — ABNORMAL LOW (ref 3.5–5.1)
Sodium: 135 mmol/L (ref 135–145)

## 2015-02-02 LAB — CBC
HEMATOCRIT: 25 % — AB (ref 36.0–46.0)
HEMOGLOBIN: 8.1 g/dL — AB (ref 12.0–15.0)
MCH: 26.6 pg (ref 26.0–34.0)
MCHC: 32.4 g/dL (ref 30.0–36.0)
MCV: 82.2 fL (ref 78.0–100.0)
Platelets: 198 10*3/uL (ref 150–400)
RBC: 3.04 MIL/uL — ABNORMAL LOW (ref 3.87–5.11)
RDW: 16.8 % — AB (ref 11.5–15.5)
WBC: 7.5 10*3/uL (ref 4.0–10.5)

## 2015-02-02 LAB — URINE CULTURE: Culture: NO GROWTH

## 2015-02-02 MED ORDER — MORPHINE SULFATE (PF) 2 MG/ML IV SOLN
2.0000 mg | INTRAVENOUS | Status: DC | PRN
  Administered 2015-02-02 – 2015-02-05 (×16): 2 mg via INTRAVENOUS
  Filled 2015-02-02 (×16): qty 1

## 2015-02-02 MED ORDER — POLYETHYLENE GLYCOL 3350 17 G PO PACK: 17.0000 g | PACK | Freq: Every day | ORAL | Status: DC | PRN

## 2015-02-02 NOTE — Progress Notes (Addendum)
Nutrition Follow-up  DOCUMENTATION CODES:   Non-severe (moderate) malnutrition in context of chronic illness  INTERVENTION:  - Continue to monitor Mg, K, P due to Re-Feeding Risk; replete per MD as appropriate.   - Please provide Pro-Stat (30 mL) TID (each supplement contains 100 kcal and 15 gm protein).  - Modify Boost Breeze to Boost Plus BID (each supplement contains 360 kcal and 14 gm protein).  - Order Multi-vitamin.  - Encouraged patient to eat high protein / energy dense foods.  - RD team to continue to monitor for needs.    NUTRITION DIAGNOSIS:   Increased nutrient needs related to catabolic illness as evidenced by estimated needs, percent weight loss.  ongoing  GOAL:   Patient will meet greater than or equal to 90% of their needs, Weight gain  Not yet met, PO 0-100%  MONITOR:   PO intake, Supplement acceptance, Labs, Weight trends, Skin  REASON FOR ASSESSMENT:   Malnutrition Screening Tool    ASSESSMENT:   44 y.o. female with h/o metastatic gastric cancer s/p resection , external biliary drain for obstruction on 9/29, recent admission for E COLI and klebsiella sepsis still on ciprofloxacin, presents today for fevers, hypotension, fatigue and vomiting since one week. She presented to cancer center for chemo and was found to be hypotensive, received fluid boluses, but spiked fever, and became tachycardic. Her bp paramters improved but was referred to medical service for admission for sepsis for possible UTI.   Patient denied abdominal pain, swallowing difficulty, or nausea / vomiting today. She noted recent taste changes upon Oncology diagnosis, but stated her taste buds are returning to normal. Patient stated that she does have increased sensitivity to smells and this can lead to increased nausea. Patient's appetite has been low since hospitalization and reported being much better PTA. Meal completion is 0-100% (with mother sharing some foods on tray).    11/8  Pt PO intake is still poor 0 - 100%, Sepsis is improving. Denies Nausea/Vomitting/Abd Pain, improved from earlier in hospitalization where she was throwing up each day.   pt reports poor appetite related to overall sickness. Likely related to worsening anemia of chronic disease and refusal of blood products related to jehovah's witness status.  Pt does have 153m of green suction via NG at this point related to biliary obstruction s/p biliary drain 8/25 & last exchange 9/29, recent biliary sepsis.  K 3.2, Ca 7.9, Glu 116 Continue to monitor/control.  Follow for PO intake  Diet Order:  Diet general Diet regular Room service appropriate?: Yes; Fluid consistency:: Thin  Skin:  Reviewed, no issues  Last BM:  11/1  Height:   Ht Readings from Last 1 Encounters:  01/20/15 _0  (1.676 m)    Weight:   Wt Readings from Last 1 Encounters:  01/20/15 132 lb 6.4 oz (60.056 kg)    Ideal Body Weight:     BMI:  Body mass index is 21.38 kg/(m^2).  Estimated Nutritional Needs:   Kcal:  [address] Protein:  80-90 gm  Fluid:  1.8-2.1 L/day  EDUCATION NEEDS:   Education needs addressed  WSatira Anis Pius Byrom, MS, RD LDN After Hours/Weekend Pager [phone]

## 2015-02-02 NOTE — Progress Notes (Signed)
Combine for Infectious Disease    Subjective: Suprapubic pain and back pain   Antibiotics:  Anti-infectives    Start     Dose/Rate Route Frequency Ordered Stop   01/30/15 0000  amoxicillin-clavulanate (AUGMENTIN) 875-125 MG tablet     1 tablet Oral 2 times daily 01/30/15 1100     01/27/15 1700  Ampicillin-Sulbactam (UNASYN) 3 g in sodium chloride 0.9 % 100 mL IVPB     3 g 100 mL/hr over 60 Minutes Intravenous Every 6 hours 01/27/15 1609     01/25/15 2200  ceftazidime-avibactam (AVYCAZ) 2.5 g in dextrose 5 % 50 mL IVPB  Status:  Discontinued     2.5 g 25 mL/hr over 2 Hours Intravenous 3 times per day 01/25/15 1245 01/27/15 1609   01/22/15 1400  ceftazidime-avibactam (AVYCAZ) 1.25 g in dextrose 5 % 50 mL IVPB    Comments:  CRE, pharmacy may adjust   1.25 g 25 mL/hr over 2 Hours Intravenous 3 times per day 01/22/15 1136 01/25/15 1647   01/22/15 1130  ciprofloxacin (CIPRO) IVPB 400 mg  Status:  Discontinued     400 mg 200 mL/hr over 60 Minutes Intravenous 3 times per day 01/22/15 1055 01/22/15 1353   01/21/15 2000  vancomycin (VANCOCIN) IVPB 1000 mg/200 mL premix  Status:  Discontinued     1,000 mg 200 mL/hr over 60 Minutes Intravenous Every 24 hours 01/20/15 1917 01/22/15 1055   01/21/15 0600  piperacillin-tazobactam (ZOSYN) IVPB 3.375 g  Status:  Discontinued     3.375 g 12.5 mL/hr over 240 Minutes Intravenous 3 times per day 01/20/15 1917 01/22/15 1055   01/20/15 1930  vancomycin (VANCOCIN) 1,250 mg in sodium chloride 0.9 % 250 mL IVPB     1,250 mg 166.7 mL/hr over 90 Minutes Intravenous STAT 01/20/15 1917 01/20/15 2131   01/20/15 1930  piperacillin-tazobactam (ZOSYN) IVPB 3.375 g     3.375 g 100 mL/hr over 30 Minutes Intravenous STAT 01/20/15 1917 01/20/15 2031      Medications: Scheduled Meds: . ampicillin-sulbactam (UNASYN) IV  3 g Intravenous Q6H  . darbepoetin (ARANESP) injection - NON-DIALYSIS  100 mcg Subcutaneous Once  . enoxaparin  (LOVENOX) injection  40 mg Subcutaneous Q24H  . feeding supplement (PRO-STAT SUGAR FREE 64)  30 mL Oral TID BM  . ferrous sulfate  325 mg Oral BID WC  . lactose free nutrition  237 mL Oral TID WC  . morphine  15 mg Oral Q12H  . multivitamin with minerals  1 tablet Oral Daily  . potassium chloride  40 mEq Oral Once   Continuous Infusions:   PRN Meds:.acetaminophen **OR** acetaminophen, LORazepam, meperidine (DEMEROL) injection, midazolam, morphine injection, ondansetron **OR** ondansetron (ZOFRAN) IV, oxyCODONE, polyethylene glycol, polyvinyl alcohol, promethazine, promethazine, sodium chloride, traZODone    Objective: Weight change:   Intake/Output Summary (Last 24 hours) at 02/02/15 1439 Last data filed at 02/02/15 1140  Gross per 24 hour  Intake   1305 ml  Output   1945 ml  Net   -640 ml   Blood pressure 112/62, pulse 100, temperature 100 F (37.8 C), temperature source Oral, resp. rate 11, height 5\' 6"  (1.676 m), weight 132 lb 6.4 oz (60.056 kg), last menstrual period 11/23/2014, SpO2 99 %. Temp:  [98.6 F (37 C)-100 F (37.8 C)] 100 F (37.8 C) (11/08 0800) Pulse Rate:  [93-116] 100 (11/08 0800) Resp:  [11-20] 11 (11/08 0527) BP: (109-122)/(62-71) 112/62 mmHg (11/08 0800)  SpO2:  [99 %-100 %] 99 % (11/08 0800)  Physical Exam: General: Alert and awake, oriented x3, not in any acute distress. HEENT: anicteric sclera, EOMI,  Cardiovascular: regular rate, normal r, no murmur rubs or gallops Pulmonary: clear to auscultation bilaterally, no wheezing, rales or rhonchi Gastrointestinal: , nondistended, normal bowel sounds, Musculoskeletal: no clubbing or edema noted bilaterally Skin, soft tissue: no rashes Neuro: nonfocal  CBC:  CBC Latest Ref Rng 02/02/2015 02/01/2015 01/31/2015  WBC 4.0 - 10.5 K/uL 7.5 6.3 5.5  Hemoglobin 12.0 - 15.0 g/dL 8.1(L) 7.4(L) 6.9(LL)  Hematocrit 36.0 - 46.0 % 25.0(L) 22.7(L) 21.4(L)  Platelets 150 - 400 K/uL 198 147(L) 141(L)       BMET  Recent Labs  02/01/15 0505 02/02/15 1020  NA 136 135  K 3.4* 3.2*  CL 102 101  CO2 28 27  GLUCOSE 90 116*  BUN 11 11  CREATININE 1.14* 1.00  CALCIUM 8.0* 7.9*     Liver Panel  No results for input(s): PROT, ALBUMIN, AST, ALT, ALKPHOS, BILITOT, BILIDIR, IBILI in the last 72 hours.     Sedimentation Rate No results for input(s): ESRSEDRATE in the last 72 hours. C-Reactive Protein No results for input(s): CRP in the last 72 hours.  Micro Results: Recent Results (from the past 720 hour(s))  Culture, blood (routine x 2)     Status: None   Collection Time: 01/06/15 10:42 AM  Result Value Ref Range Status   Specimen Description BLOOD LEFT ANTECUBITAL  Final   Special Requests BOTTLES DRAWN AEROBIC AND ANAEROBIC 5ML  Final   Culture  Setup Time   Final    GRAM NEGATIVE RODS IN BOTH AEROBIC AND ANAEROBIC BOTTLES CRITICAL RESULT CALLED TO, READ BACK BY AND VERIFIED WITH: [name] @0235  01/07/15 [name]    Culture   Final    KLEBSIELLA PNEUMONIAE SUSCEPTIBILITIES PERFORMED ON PREVIOUS CULTURE WITHIN THE LAST 5 DAYS. Performed at [facility]    Report Status 01/10/2015 FINAL  Final  Urine culture     Status: None   Collection Time: 01/06/15 10:44 AM  Result Value Ref Range Status   Specimen Description URINE, CATHETERIZED  Final   Special Requests NONE  Final   Culture   Final    NO GROWTH 1 DAY Performed at [facility]    Report Status 01/07/2015 FINAL  Final  Culture, blood (routine x 2)     Status: None   Collection Time: 01/06/15 11:00 AM  Result Value Ref Range Status   Specimen Description BLOOD PORTA CATH  Final   Special Requests BOTTLES DRAWN AEROBIC AND ANAEROBIC 5CC  Final   Culture  Setup Time   Final    GRAM NEGATIVE RODS IN BOTH AEROBIC AND ANAEROBIC BOTTLES CRITICAL RESULT CALLED TO, READ BACK BY AND VERIFIED WITH: [name] RN 2129 01/06/15 [name]    Culture   Final    KLEBSIELLA PNEUMONIAE ESCHERICHIA  COLI Performed at [facility]    Report Status 01/09/2015 FINAL  Final   Organism ID, Bacteria KLEBSIELLA PNEUMONIAE  Final   Organism ID, Bacteria ESCHERICHIA COLI  Final      Susceptibility   Escherichia coli - MIC*    AMPICILLIN 8 SENSITIVE Sensitive     CEFAZOLIN <=4 SENSITIVE Sensitive     CEFEPIME <=1 SENSITIVE Sensitive     CEFTAZIDIME <=1 SENSITIVE Sensitive     CEFTRIAXONE <=1 SENSITIVE Sensitive     CIPROFLOXACIN <=0.25 SENSITIVE Sensitive     GENTAMICIN <=  1 SENSITIVE Sensitive     IMIPENEM <=0.25 SENSITIVE Sensitive     TRIMETH/SULFA <=20 SENSITIVE Sensitive     AMPICILLIN/SULBACTAM 4 SENSITIVE Sensitive     PIP/TAZO <=4 SENSITIVE Sensitive     * ESCHERICHIA COLI   Klebsiella pneumoniae - MIC*    AMPICILLIN >=32 RESISTANT Resistant     CEFAZOLIN <=4 SENSITIVE Sensitive     CEFEPIME <=1 SENSITIVE Sensitive     CEFTAZIDIME <=1 SENSITIVE Sensitive     CEFTRIAXONE <=1 SENSITIVE Sensitive     CIPROFLOXACIN <=0.25 SENSITIVE Sensitive     GENTAMICIN <=1 SENSITIVE Sensitive     IMIPENEM <=0.25 SENSITIVE Sensitive     TRIMETH/SULFA <=20 SENSITIVE Sensitive     AMPICILLIN/SULBACTAM 4 SENSITIVE Sensitive     PIP/TAZO <=4 SENSITIVE Sensitive     * KLEBSIELLA PNEUMONIAE  MRSA PCR Screening     Status: None   Collection Time: 01/06/15  4:38 PM  Result Value Ref Range Status   MRSA by PCR NEGATIVE NEGATIVE Final    Comment:        The GeneXpert MRSA Assay (FDA approved for NASAL specimens only), is one component of a comprehensive MRSA colonization surveillance program. It is not intended to diagnose MRSA infection nor to guide or monitor treatment for MRSA infections.   Culture, body fluid-bottle     Status: None   Collection Time: 01/06/15  7:12 PM  Result Value Ref Range Status   Specimen Description FLUID BILE  Final   Special Requests RUQ  Final   Gram Stain   Final    GRAM VARIABLE ROD IN BOTH AEROBIC AND ANAEROBIC BOTTLES CRITICAL RESULT CALLED TO,  READ BACK BY AND VERIFIED WITH: BARHAM @0509  01/07/15 [name]    Culture   Final    ENTEROBACTER CLOACAE MODIFIED HODGE TEST=POSITIVE CRITICAL RESULT CALLED TO, READ BACK BY AND VERIFIED WITH: Orville Govern 01/11/15 @ 1053 M VESTAL Performed at [facility]    Report Status 01/11/2015 FINAL  Final   Organism ID, Bacteria ENTEROBACTER CLOACAE  Final      Susceptibility   Enterobacter cloacae - MIC*    CEFAZOLIN >=64 RESISTANT Resistant     CEFEPIME 2 RESISTANT Resistant     CEFTAZIDIME >=64 RESISTANT Resistant     CEFTRIAXONE >=64 RESISTANT Resistant     CIPROFLOXACIN <=0.25 SENSITIVE Sensitive     GENTAMICIN <=1 SENSITIVE Sensitive     IMIPENEM <=0.25 RESISTANT Resistant     TRIMETH/SULFA <=20 SENSITIVE Sensitive     PIP/TAZO >=128 RESISTANT Resistant     * ENTEROBACTER CLOACAE  Gram stain     Status: None   Collection Time: 01/06/15  7:12 PM  Result Value Ref Range Status   Specimen Description FLUID BILE  Final   Special Requests RUQ  Final   Gram Stain   Final    CYTOSPIN SMEAR BUDDING YEAST SEEN GRAM VARIABLE ROD GRAM POSITIVE COCCI IN CLUSTERS CRITICAL RESULT CALLED TO, READ BACK BY AND VERIFIED WITH: [name] @0235  01/07/15 [name]    Report Status 01/07/2015 FINAL  Final  Culture, blood (routine x 2)     Status: None   Collection Time: 01/09/15 12:20 PM  Result Value Ref Range Status   Specimen Description BLOOD RIGHT ARM  Final   Special Requests IN PEDIATRIC BOTTLE 5CC  Final   Culture   Final    NO GROWTH 5 DAYS Performed at [facility]    Report Status 01/14/2015 FINAL  Final  Culture, blood (  routine x 2)     Status: None   Collection Time: 01/09/15 12:34 PM  Result Value Ref Range Status   Specimen Description BLOOD RIGHT ARM  Final   Special Requests BOTTLES DRAWN AEROBIC AND ANAEROBIC 10CC  Final   Culture   Final    NO GROWTH 5 DAYS Performed at [facility]    Report Status 01/14/2015 FINAL  Final  Blood culture (routine single)      Status: None   Collection Time: 01/20/15  3:21 PM  Result Value Ref Range Status   Blood Culture, Routine Culture, Blood  Final    Comment: Final - ===== FINAL REPORT =====NO GROWTH 5 DAYS  Urine Culture     Status: None   Collection Time: 01/20/15  3:21 PM  Result Value Ref Range Status   Urine Culture, Routine Culture, Urine  Final    Comment: Final - ===== COLONY COUNT: ===== 60,000 COLONIES/ML ESCHERICHIA COLI  ------------------------------------------------------------------------  ESCHERICHIA COLI     AMPICILLIN                       MIC      Resistant       >=32 ug/ml    AMOX/CLAVULANIC                  MIC      Indeterminate     16 ug/ml    AMPICILLIN/SUL                   MIC      Resistant       >=32 ug/ml    PIPERACILLIN/TAZO                MIC      Resistant      >=128 ug/ml    IMIPENEM                         MIC      Sensitive     <=0.25 ug/ml    CEFAZOLIN                        MIC      Resistant          8 ug/ml    CEFTRIAXONE                      MIC      Sensitive        <=1 ug/ml    CEFTAZIDIME                      MIC      Sensitive        <=1 ug/ml    CEFEPIME                         MIC      Sensitive        <=1 ug/ml    GENTAMICIN                       MIC      Sensitive        <=1 ug/ml    TOBRAMYCIN                       MIC  Sensitive        <=1 ug/ml    CIPROFLOXACIN                     MIC      Resistant        >=4 ug/ml    LEVOFLOXACIN                     MIC      Resistant        >=8 ug/ml    NITROFURANTOIN                   MIC      Sensitive       <=16 ug/ml    TRIMETH/SULFA                    MIC      Resistant      >=320 ug/ml  ------------------------------------------------------------------------ NR=NOT REPORTABLE,SEE COMMENT ORAL therapy:A cefazolin MIC of <32 predicts  susceptibility to the oral agents cefaclor, cefdinir,cefpodoxime,cefprozil,cefuroxime, cephalexin,and loracarbef when used for therapy  of uncomplicated UTIs due to  E.coli,K.pneumomiae, and P.mirabilis. PARENTERAL therapy: A cefazolin MIC of >8 indicates resistance to parenteral cefazolin. An alternate test method must be performed to confirm susceptibility to parenteral cefazolin. END OF REPORT   Blood culture (routine single)     Status: None   Collection Time: 01/20/15  3:22 PM  Result Value Ref Range Status   Blood Culture, Routine Culture, Blood  Final    Comment: ------------------------------------------------------------------------Gram Stain Report Called to,Read Back Byand Verified With:ROSALYND W @ 9:39 AM10/28/16 BY DWEEKSFinal - ===== FINAL REPORT =====KLEBSIELLA  PNEUMONIAE------------------------------------------------------------------------ KLEBSIELLA PNEUMONIAE   AMPICILLIN                       MIC      Resistant       >=32 ug/ml   AMOX/CLAVULANIC                  MIC      Sensitive        <=2 ug/ml    AMPICILLIN/SUL                   MIC      Sensitive          4 ug/ml   PIPERACILLIN/TAZO                MIC      Sensitive          8 ug/ml   IMIPENEM                         MIC      Sensitive     <=0.25 ug/ml   CEFAZOLIN                        MIC       NR               <=4 ug/ml   CEFTRIAXONE                      MIC      Sensitive        <=1 ug/ml   CEFTAZIDIME                      MIC  Sensitive        <=1 ug/ml   CEFEPIME                         MIC      Sensitive        <=1 ug/ml   GENTAMICIN                        MI C      Sensitive        <=1 ug/ml   TOBRAMYCIN                       MIC      Sensitive        <=1 ug/ml   CIPROFLOXACIN                    MIC      Indeterminate        ug/ml   LEVOFLOXACIN                     MIC       Indeterminate      4 ug/ml   TRIMETH/SULFA                    MIC      Sensitive         40 ug/ml------------------------------------------------------------------------NR=NOT REPORTABLE,SEE COMMENTORAL therapy:A cefazolin MIC of <32 predicts  susceptibility to the oral agents  cefaclor,cefdinir,cefpodoxime,cefprozil,cefuroxime,cephalexin,and loracarbef when used for therapy of uncomplicated UTIs due to E.coli,K.pneumomiae,and P.mirabilis. PARENTERAL therapy: A cefazolinMIC of >8 indicates  resistance to parenteralcefazolin. An alternate test method must beperformed to confirm susceptibility to parenteralcefazolin.END OF REPORT   Urine culture     Status: None   Collection Time: 01/20/15  7:30 PM  Result Value Ref Range Status   Specimen Description URINE, RANDOM  Final   Special Requests NONE  Final   Culture   Final    9,000 COLONIES/mL INSIGNIFICANT GROWTH Performed at [facility]    Report Status 01/22/2015 FINAL  Final  Culture, blood (routine x 2)     Status: None   Collection Time: 01/25/15  6:49 PM  Result Value Ref Range Status   Specimen Description BLOOD RIGHT HAND  Final   Special Requests IN PEDIATRIC BOTTLE 3CC  Final   Culture   Final    NO GROWTH 5 DAYS Performed at [facility]    Report Status 01/30/2015 FINAL  Final  Culture, blood (routine x 2)     Status: None   Collection Time: 01/25/15  6:49 PM  Result Value Ref Range Status   Specimen Description BLOOD LEFT ANTECUBITAL  Final   Special Requests BOTTLES DRAWN AEROBIC AND ANAEROBIC 10CC EA  Final   Culture   Final    NO GROWTH 5 DAYS Performed at [facility]    Report Status 01/30/2015 FINAL  Final  C difficile quick scan w PCR reflex     Status: None   Collection Time: 01/30/15 10:34 PM  Result Value Ref Range Status   C Diff antigen NEGATIVE NEGATIVE Final   C Diff toxin NEGATIVE NEGATIVE Final   C Diff interpretation Negative for toxigenic C. difficile  Final  Urine culture     Status: None (Preliminary result)   Collection Time: 02/01/15  3:12 PM  Result Value Ref Range Status   Specimen Description  KIDNEY RIGHT CYSTOSCOPY  Final   Special Requests PATIENT ON FOLLOWING UNASYN  Final   Culture   Final    NO GROWTH < 24 HOURS Performed at [facility]    Report Status PENDING  Incomplete    Studies/Results: No results found.    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 She was dc on cipro and now readmitted with septic picture with biliary area thought to be source of infection and + blood culture  #1 Biliary infection + bacteremia:  for now continue IV unasyn.  planning on changing to augmentin at DC  #2 Obstructive lesion sp cystoscopy and stent placement and cultures sen. Not clear that she has UTI, she has some pain in bladder region and back post-cystoscopy  She is being kept due to persistent pain    LOS: 13 days   Alcide Evener 02/02/2015, 2:39 PM

## 2015-02-02 NOTE — Care Management Note (Signed)
Case Management Note  Patient Details  Name: ZAILA CREW MRN: [id number] Date of Birth: [birth year]  Subjective/Objective: POD#1 stent.Noted temp-100. Active w/[facility]-HHRN-drain.If HHRN needed please order.                   Action/Plan:d/c plan home w/HHC.   Expected Discharge Date:                  Expected Discharge Plan:  [place]  In-House Referral:     Discharge planning Services  CM Consult  Post Acute Care Choice:  [place] (Active w/[facility]) Choice offered to:     DME Arranged:    DME Agency:     HH Arranged:    HH Agency:     Status of Service:  In process, will continue to follow  Medicare Important Message Given:    Date Medicare IM Given:    Medicare IM give by:    Date Additional Medicare IM Given:    Additional Medicare Important Message give by:     If discussed at [place] of Stay Meetings, dates discussed:    Additional Comments:  Dessa Phi, RN 02/02/2015, 11:48 AM

## 2015-02-02 NOTE — Progress Notes (Addendum)
[facility] PROGRESS NOTE  LACYE MCCARN BJS:[id number] DOB: 1971/03/15 DOA: 01/20/2015 PCP: Milagros Evener, MD Brief history: 44 year old lady with h/o gastric cancer s/p partial gastrectomy with metastatic disease, with last chemo therapy in the first week of October, and recent external biliary drain placement for obstruction on 9/29, presented to ED in the first week of October for sepsis. During the hospitalization she grew klebsiella and E coli in the blood and Enterobacter species ( CRE) from the biliary drain  (cultures were drawn from the biliary drain by IR). All of the bacteria were sensitive to ciprofloxacin and she was discharged on oral ciprofloxacin on 10/17 to complete the course.  She presents to cancer center on 10/26 for sepsis and acute renal failure from dehydration and this time her urine cultures grew 60,000 colonies resistant to ciprofloxacin, but sensitive to Augmentin and one blood culture growing klebsiella ,pan sensitive.  Dr.Akula d/w Dr Linus Salmons for recommendations, started her on Avycaz. She has been on Avycaz from 10/28.  ID Dr Tommy Medal consulted., she was started on IV Unasyn Meanwhile during her hospitalization she was having recurrent vomiting. Dr Learta Codding recommended upper GI series, unremarkable CT abd delayed pending-pending clearing of barium, showed increased Hydronephrosis and ureteral obstruction, Consulted Urology, s/p R ureteral stent 11/7  Assessment/Plan: Sepsis  -improving, was febrile, hypotensive and tachycardic on admission, Lactic acid elevated.  -Repeat lactic acid normalized after fluid boluses.  -Blood cultures 10/26 grew klebsiella 1/2 . Urine cultures grew 60,000 e coli resistent to ciprofloxacin.  -repeat Blood Cx 10/31: negative -Dr.VanDam following and started on IV Unasyn 11/3,   -last admission with e coli and klebsiella bacteremia and Enterobacter in biliary fluid -clinically much improved, CT was delayed since she had to  Barium in her bowel from Upper GI series,  CT abd 11/5 with increase Hydronephrosis and ureteral obstruction, Consulted Urology, seen by Dr.Eskridge, underwent R ureteral stent 11/7, today having a lot of pain in relation to this and running low grade fever -will monitor, repeat Blood culture if spikes again  Metastatic gastric Ca -Chemo on hold -followed by Dr.Sherril  Anemia of chronic disease and chemo -mild worsening, asymptomatic, given Aranesp 11/6 -she is a jehovah's witness and will not receive blood products.   Malignant biliary obstruction  -s/p I/E biliary drain 11/19/14 with last exchange on 12/24/14; recent biliary sepsis -IR following and drain felt to be functioning well -drain care, FU with IR to make decision regarding biliary stent  Nausea/Vomiting, Abd pain -due to above, supportive care,  -improved, tolerating diet now -CT as discussed above -upper Gi series unremarkable  Sjogren's disease: plaquenil on hold.   Acute renal failure: -possibly from the hypotension and decreased renal perfusion. Improving with hydration. Cut down IVF.   Asthma; -no wheezing  DVT proph: lovenox  Code Status: full code. Family Communication: mother at bedside.  Disposition Plan: home when stable   Consultants:  Oncology Dr Benay Spice.  ID Dr Tommy Medal  IR  Procedures: None  Antibiotics:  avycaz 10/28 till date.    vancomycin and zosyn -10/26 till 10/27  IV cipro on 10/27 to 10/28.    HPI/Subjective: Having more pain, feels warm, temp 100  Objective: Filed Vitals:   02/02/15 0800  BP: 112/62  Pulse: 100  Temp: 100 F (37.8 C)  Resp:     Intake/Output Summary (Last 24 hours) at 02/02/15 1135 Last data filed at 02/02/15 0930  Gross per 24 hour  Intake   1305 ml  Output   1370 ml  Net    -65 ml   Filed Weights   01/20/15 1744  Weight: 60.056 kg (132 lb 6.4 oz)    Exam:   General:  uncomfortable, AAOx3  Cardiovascular: s1s2  Respiratory:  ctab,   Abdomen: soft ND BS+, biliary drain, mild flank tenderness  Musculoskeletal: no pedal edema.   Data Reviewed: Basic Metabolic Panel:  Recent Labs Lab 01/29/15 0838 01/30/15 0520 01/31/15 0425 02/01/15 0505 02/02/15 1020  NA 137 140 136 136 135  K 3.2* 3.2* 3.2* 3.4* 3.2*  CL 106 109 105 102 101  CO2 24 27 26 28 27   GLUCOSE 115* 90 94 90 116*  BUN <5* 7 9 11 11   CREATININE 1.10* 1.07* 1.09* 1.14* 1.00  CALCIUM 8.1* 7.9* 7.7* 8.0* 7.9*   Liver Function Tests:  Recent Labs Lab 01/28/15 0350  AST 20  ALT 10*  ALKPHOS 67  BILITOT 0.8  PROT 6.5  ALBUMIN 2.2*   No results for input(s): LIPASE, AMYLASE in the last 168 hours. No results for input(s): AMMONIA in the last 168 hours. CBC:  Recent Labs Lab 01/30/15 0520 01/30/15 1105 01/31/15 0425 02/01/15 0505 02/02/15 1020  WBC 5.0 8.5 5.5 6.3 7.5  HGB 6.2* 8.2* 6.9* 7.4* 8.1*  HCT 18.8* 25.1* 21.4* 22.7* 25.0*  MCV 81.7 82.8 82.3 81.9 82.2  PLT 136* 173 141* 147* 198   Cardiac Enzymes: No results for input(s): CKTOTAL, CKMB, CKMBINDEX, TROPONINI in the last 168 hours. BNP (last 3 results)  Recent Labs  01/06/15 1450  BNP 108.2*    ProBNP (last 3 results) No results for input(s): PROBNP in the last 8760 hours.  CBG: No results for input(s): GLUCAP in the last 168 hours.  Recent Results (from the past 240 hour(s))  Culture, blood (routine x 2)     Status: None   Collection Time: 01/25/15  6:49 PM  Result Value Ref Range Status   Specimen Description BLOOD RIGHT HAND  Final   Special Requests IN PEDIATRIC BOTTLE 3CC  Final   Culture   Final    NO GROWTH 5 DAYS Performed at [facility]    Report Status 01/30/2015 FINAL  Final  Culture, blood (routine x 2)     Status: None   Collection Time: 01/25/15  6:49 PM  Result Value Ref Range Status   Specimen Description BLOOD LEFT ANTECUBITAL  Final   Special Requests BOTTLES DRAWN AEROBIC AND ANAEROBIC 10CC EA  Final   Culture   Final     NO GROWTH 5 DAYS Performed at [facility]    Report Status 01/30/2015 FINAL  Final  C difficile quick scan w PCR reflex     Status: None   Collection Time: 01/30/15 10:34 PM  Result Value Ref Range Status   C Diff antigen NEGATIVE NEGATIVE Final   C Diff toxin NEGATIVE NEGATIVE Final   C Diff interpretation Negative for toxigenic C. difficile  Final  Urine culture     Status: None (Preliminary result)   Collection Time: 02/01/15  3:12 PM  Result Value Ref Range Status   Specimen Description KIDNEY RIGHT CYSTOSCOPY  Final   Special Requests PATIENT ON FOLLOWING UNASYN  Final   Culture   Final    NO GROWTH < 24 HOURS Performed at [facility]    Report Status PENDING  Incomplete     Studies: No results found.  Scheduled Meds: . ampicillin-sulbactam (UNASYN) IV  3 g Intravenous  Q6H  . darbepoetin (ARANESP) injection - NON-DIALYSIS  100 mcg Subcutaneous Once  . enoxaparin (LOVENOX) injection  40 mg Subcutaneous Q24H  . feeding supplement (PRO-STAT SUGAR FREE 64)  30 mL Oral TID BM  . ferrous sulfate  325 mg Oral BID WC  . lactose free nutrition  237 mL Oral TID WC  . morphine  15 mg Oral Q12H  . multivitamin with minerals  1 tablet Oral Daily  . 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

## 2015-02-03 DIAGNOSIS — D509 Iron deficiency anemia, unspecified: Secondary | ICD-10-CM

## 2015-02-03 DIAGNOSIS — N132 Hydronephrosis with renal and ureteral calculous obstruction: Secondary | ICD-10-CM | POA: Insufficient documentation

## 2015-02-03 DIAGNOSIS — R1084 Generalized abdominal pain: Secondary | ICD-10-CM

## 2015-02-03 DIAGNOSIS — Z228 Carrier of other infectious diseases: Secondary | ICD-10-CM

## 2015-02-03 DIAGNOSIS — B961 Klebsiella pneumoniae [K. pneumoniae] as the cause of diseases classified elsewhere: Secondary | ICD-10-CM

## 2015-02-03 LAB — BASIC METABOLIC PANEL
Anion gap: 5 (ref 5–15)
BUN: 14 mg/dL (ref 6–20)
CALCIUM: 7.9 mg/dL — AB (ref 8.9–10.3)
CO2: 28 mmol/L (ref 22–32)
CREATININE: 1.03 mg/dL — AB (ref 0.44–1.00)
Chloride: 102 mmol/L (ref 101–111)
Glucose, Bld: 108 mg/dL — ABNORMAL HIGH (ref 65–99)
Potassium: 3.2 mmol/L — ABNORMAL LOW (ref 3.5–5.1)
SODIUM: 135 mmol/L (ref 135–145)

## 2015-02-03 LAB — CBC
HCT: 23.8 % — ABNORMAL LOW (ref 36.0–46.0)
Hemoglobin: 7.7 g/dL — ABNORMAL LOW (ref 12.0–15.0)
MCH: 26.6 pg (ref 26.0–34.0)
MCHC: 32.4 g/dL (ref 30.0–36.0)
MCV: 82.4 fL (ref 78.0–100.0)
PLATELETS: 175 10*3/uL (ref 150–400)
RBC: 2.89 MIL/uL — ABNORMAL LOW (ref 3.87–5.11)
RDW: 16.7 % — AB (ref 11.5–15.5)
WBC: 8.4 10*3/uL (ref 4.0–10.5)

## 2015-02-03 MED ORDER — POLYETHYLENE GLYCOL 3350 17 G PO PACK
17.0000 g | PACK | Freq: Every day | ORAL | Status: DC
  Administered 2015-02-03: 17 g via ORAL
  Filled 2015-02-03 (×3): qty 1

## 2015-02-03 MED ORDER — MAGNESIUM HYDROXIDE 400 MG/5ML PO SUSP
30.0000 mL | Freq: Once | ORAL | Status: AC
  Administered 2015-02-03: 30 mL via ORAL
  Filled 2015-02-03: qty 30

## 2015-02-03 MED ORDER — MAGNESIUM SULFATE 2 GM/50ML IV SOLN
2.0000 g | Freq: Once | INTRAVENOUS | Status: AC
  Administered 2015-02-03: 2 g via INTRAVENOUS
  Filled 2015-02-03: qty 50

## 2015-02-03 MED ORDER — POTASSIUM CHLORIDE CRYS ER 20 MEQ PO TBCR
40.0000 meq | EXTENDED_RELEASE_TABLET | Freq: Four times a day (QID) | ORAL | Status: AC
  Administered 2015-02-03 (×2): 40 meq via ORAL
  Filled 2015-02-03 (×2): qty 2

## 2015-02-03 NOTE — Progress Notes (Signed)
[facility] PROGRESS NOTE  Andrea Best [id number]:[id number] DOB: 11-27-1970 DOA: 01/20/2015 PCP: Milagros Evener, MD    HPI/Subjective: Still running low-grade fever of 100.3 yesterday. Has low potassium, did not have bowel movement for past 5-6 days.  Brief history: 44 year old lady with h/o gastric cancer s/p partial gastrectomy with metastatic disease, with last chemo therapy in the first week of October, and recent external biliary drain placement for obstruction on 9/29, presented to ED in the first week of October for sepsis. During the hospitalization she grew klebsiella and E coli in the blood and Enterobacter species ( CRE) from the biliary drain  (cultures were drawn from the biliary drain by IR). All of the bacteria were sensitive to ciprofloxacin and she was discharged on oral ciprofloxacin on 10/17 to complete the course.  She presents to cancer center on 10/26 for sepsis and acute renal failure from dehydration and this time her urine cultures grew 60,000 colonies resistant to ciprofloxacin, but sensitive to Augmentin and one blood culture growing klebsiella ,pan sensitive.  Dr.Akula d/w Dr Linus Salmons for recommendations, started her on Avycaz. She has been on Avycaz from 10/28.  ID Dr Tommy Medal consulted., she was started on IV Unasyn Meanwhile during her hospitalization she was having recurrent vomiting. Dr Learta Codding recommended upper GI series, unremarkable CT abd delayed pending-pending clearing of barium, showed increased Hydronephrosis and ureteral obstruction, Consulted Urology, s/p R ureteral stent 11/7  Assessment/Plan: Sepsis  -improving, was febrile, hypotensive and tachycardic on admission, Lactic acid elevated.  -Repeat lactic acid normalized after fluid boluses.  -Blood cultures 10/26 grew klebsiella 1/2 . Urine cultures grew 60,000 e coli resistent to ciprofloxacin.  -repeat Blood Cx 10/31: negative -Dr.VanDam following and started on IV Unasyn 11/3,   -last  admission with e coli and klebsiella bacteremia and Enterobacter in biliary fluid -clinically much improved, CT was delayed since she had to Barium in her bowel from Upper GI series,  CT abd 11/5 with increase Hydronephrosis and ureteral obstruction, Consulted Urology, seen by Dr.Eskridge, underwent R ureteral stent 11/7, today having a lot of pain in relation to this and running low grade fever -will monitor, repeat Blood culture if spikes again  Metastatic gastric Ca -Chemo on hold -followed by Dr.Sherril  Anemia of chronic disease and chemo -mild worsening, asymptomatic, given Aranesp 11/6 -she is a jehovah's witness and will not receive blood products.   Malignant biliary obstruction  -s/p I/E biliary drain 11/19/14 with last exchange on 12/24/14; recent biliary sepsis -IR following and drain felt to be functioning well -drain care, FU with IR to make decision regarding biliary stent  Nausea/Vomiting, Abd pain -due to above, supportive care,  -improved, tolerating diet now -CT as discussed above -upper Gi series unremarkable  Sjogren's disease: plaquenil on hold.   Acute renal failure: -possibly from the hypotension and decreased renal perfusion. Improving with hydration. Cut down IVF.   Asthma; -no wheezing  Constipation -Added bowel regimen. This is likely secondary to opioid pain medications.  DVT proph: lovenox  Code Status: full code. Family Communication: mother at bedside.  Disposition Plan: home when stable   Consultants:  Oncology Dr Benay Spice.  ID Dr Tommy Medal  IR  Procedures: None  Antibiotics:  avycaz 10/28 till date.    vancomycin and zosyn -10/26 till 10/27  IV cipro on 10/27 to 10/28.     Objective: Filed Vitals:   02/03/15 1402  BP: 112/60  Pulse: 107  Temp: 99.2 F (37.3 C)  Resp: 17  Intake/Output Summary (Last 24 hours) at 02/03/15 1403 Last data filed at 02/03/15 0700  Gross per 24 hour  Intake   1560 ml  Output    960  ml  Net    600 ml   Filed Weights   01/20/15 1744  Weight: 60.056 kg (132 lb 6.4 oz)    Exam:   General:  uncomfortable, AAOx3  Cardiovascular: s1s2  Respiratory: ctab,   Abdomen: soft ND BS+, biliary drain, mild flank tenderness  Musculoskeletal: no pedal edema.   Data Reviewed: Basic Metabolic Panel:  Recent Labs Lab 01/30/15 0520 01/31/15 0425 02/01/15 0505 02/02/15 1020 02/03/15 0444  NA 140 136 136 135 135  K 3.2* 3.2* 3.4* 3.2* 3.2*  CL 109 105 102 101 102  CO2 27 26 28 27 28   GLUCOSE 90 94 90 116* 108*  BUN 7 9 11 11 14   CREATININE 1.07* 1.09* 1.14* 1.00 1.03*  CALCIUM 7.9* 7.7* 8.0* 7.9* 7.9*   Liver Function Tests:  Recent Labs Lab 01/28/15 0350  AST 20  ALT 10*  ALKPHOS 67  BILITOT 0.8  PROT 6.5  ALBUMIN 2.2*   No results for input(s): LIPASE, AMYLASE in the last 168 hours. No results for input(s): AMMONIA in the last 168 hours. CBC:  Recent Labs Lab 01/30/15 1105 01/31/15 0425 02/01/15 0505 02/02/15 1020 02/03/15 0444  WBC 8.5 5.5 6.3 7.5 8.4  HGB 8.2* 6.9* 7.4* 8.1* 7.7*  HCT 25.1* 21.4* 22.7* 25.0* 23.8*  MCV 82.8 82.3 81.9 82.2 82.4  PLT 173 141* 147* 198 175   Cardiac Enzymes: No results for input(s): CKTOTAL, CKMB, CKMBINDEX, TROPONINI in the last 168 hours. BNP (last 3 results)  Recent Labs  01/06/15 1450  BNP 108.2*    ProBNP (last 3 results) No results for input(s): PROBNP in the last 8760 hours.  CBG: No results for input(s): GLUCAP in the last 168 hours.  Recent Results (from the past 240 hour(s))  Culture, blood (routine x 2)     Status: None   Collection Time: 01/25/15  6:49 PM  Result Value Ref Range Status   Specimen Description BLOOD RIGHT HAND  Final   Special Requests IN PEDIATRIC BOTTLE 3CC  Final   Culture   Final    NO GROWTH 5 DAYS Performed at [facility]    Report Status 01/30/2015 FINAL  Final  Culture, blood (routine x 2)     Status: None   Collection Time: 01/25/15  6:49 PM   Result Value Ref Range Status   Specimen Description BLOOD LEFT ANTECUBITAL  Final   Special Requests BOTTLES DRAWN AEROBIC AND ANAEROBIC 10CC EA  Final   Culture   Final    NO GROWTH 5 DAYS Performed at [facility]    Report Status 01/30/2015 FINAL  Final  C difficile quick scan w PCR reflex     Status: None   Collection Time: 01/30/15 10:34 PM  Result Value Ref Range Status   C Diff antigen NEGATIVE NEGATIVE Final   C Diff toxin NEGATIVE NEGATIVE Final   C Diff interpretation Negative for toxigenic C. difficile  Final  Urine culture     Status: None   Collection Time: 02/01/15  3:12 PM  Result Value Ref Range Status   Specimen Description KIDNEY RIGHT CYSTOSCOPY  Final   Special Requests PATIENT ON FOLLOWING UNASYN  Final   Culture   Final    NO GROWTH 1 DAY Performed at [facility]  Report Status 02/02/2015 FINAL  Final     Studies: No results found.  Scheduled Meds: . ampicillin-sulbactam (UNASYN) IV  3 g Intravenous Q6H  . darbepoetin (ARANESP) injection - NON-DIALYSIS  100 mcg Subcutaneous Once  . enoxaparin (LOVENOX) injection  40 mg Subcutaneous Q24H  . feeding supplement (PRO-STAT SUGAR FREE 64)  30 mL Oral TID BM  . ferrous sulfate  325 mg Oral BID WC  . lactose free nutrition  237 mL Oral TID WC  . morphine  15 mg Oral Q12H  . multivitamin with minerals  1 tablet Oral Daily  . 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

## 2015-02-03 NOTE — Progress Notes (Signed)
[facility]    Subjective: Suprapubic pain and back pain are better   Antibiotics:  Anti-infectives    Start     Dose/Rate Route Frequency Ordered Stop   01/30/15 0000  amoxicillin-clavulanate (AUGMENTIN) 875-125 MG tablet     1 tablet Oral 2 times daily 01/30/15 1100     01/27/15 1700  Ampicillin-Sulbactam (UNASYN) 3 g in sodium chloride 0.9 % 100 mL IVPB     3 g 100 mL/hr over 60 Minutes Intravenous Every 6 hours 01/27/15 1609     01/25/15 2200  ceftazidime-avibactam (AVYCAZ) 2.5 g in dextrose 5 % 50 mL IVPB  Status:  Discontinued     2.5 g 25 mL/hr over 2 Hours Intravenous 3 times per day 01/25/15 1245 01/27/15 1609   01/22/15 1400  ceftazidime-avibactam (AVYCAZ) 1.25 g in dextrose 5 % 50 mL IVPB    Comments:  CRE, pharmacy may adjust   1.25 g 25 mL/hr over 2 Hours Intravenous 3 times per day 01/22/15 1136 01/25/15 1647   01/22/15 1130  ciprofloxacin (CIPRO) IVPB 400 mg  Status:  Discontinued     400 mg 200 mL/hr over 60 Minutes Intravenous 3 times per day 01/22/15 1055 01/22/15 1353   01/21/15 2000  vancomycin (VANCOCIN) IVPB 1000 mg/200 mL premix  Status:  Discontinued     1,000 mg 200 mL/hr over 60 Minutes Intravenous Every 24 hours 01/20/15 1917 01/22/15 1055   01/21/15 0600  piperacillin-tazobactam (ZOSYN) IVPB 3.375 g  Status:  Discontinued     3.375 g 12.5 mL/hr over 240 Minutes Intravenous 3 times per day 01/20/15 1917 01/22/15 1055   01/20/15 1930  vancomycin (VANCOCIN) 1,250 mg in sodium chloride 0.9 % 250 mL IVPB     1,250 mg 166.7 mL/hr over 90 Minutes Intravenous STAT 01/20/15 1917 01/20/15 2131   01/20/15 1930  piperacillin-tazobactam (ZOSYN) IVPB 3.375 g     3.375 g 100 mL/hr over 30 Minutes Intravenous STAT 01/20/15 1917 01/20/15 2031      Medications: Scheduled Meds: . ampicillin-sulbactam (UNASYN) IV  3 g Intravenous Q6H  . darbepoetin (ARANESP) injection - NON-DIALYSIS  100 mcg Subcutaneous Once  .  enoxaparin (LOVENOX) injection  40 mg Subcutaneous Q24H  . feeding supplement (PRO-STAT SUGAR FREE 64)  30 mL Oral TID BM  . ferrous sulfate  325 mg Oral BID WC  . lactose free nutrition  237 mL Oral TID WC  . magnesium hydroxide  30 mL Oral Once  . morphine  15 mg Oral Q12H  . multivitamin with minerals  1 tablet Oral Daily  . polyethylene glycol  17 g Oral Daily  . potassium chloride  40 mEq Oral Q6H   Continuous Infusions:   PRN Meds:.acetaminophen **OR** acetaminophen, LORazepam, morphine injection, ondansetron **OR** ondansetron (ZOFRAN) IV, oxyCODONE, polyethylene glycol, polyvinyl alcohol, promethazine, promethazine, sodium chloride, traZODone    Objective: Weight change:   Intake/Output Summary (Last 24 hours) at 02/03/15 1537 Last data filed at 02/03/15 0700  Gross per 24 hour  Intake   1020 ml  Output    760 ml  Net    260 ml   Blood pressure 112/60, pulse 107, temperature 99.2 F (37.3 C), temperature source Oral, resp. rate 17, height 5\' 6"  (1.676 m), weight 132 lb 6.4 oz (60.056 kg), last menstrual period 11/23/2014, SpO2 100 %. Temp:  [98.3 F (36.8 C)-100.3 F (37.9 C)] 99.2 F (37.3 C) (11/09 1402) Pulse  Rate:  [107-117] 107 (11/09 1402) Resp:  [17-19] 17 (11/09 1402) BP: (103-112)/(60) 112/60 mmHg (11/09 1402) SpO2:  [100 %] 100 % (11/09 1402)  Physical Exam: General: Alert and awake, oriented x3, not in any acute distress. HEENT: anicteric sclera, EOMI,  Cardiovascular: regular rate, normal r, no murmur rubs or gallops Pulmonary: clear to auscultation bilaterally, no wheezing, rales or rhonchi Gastrointestinal: , nondistended, normal bowel sounds, Musculoskeletal: no clubbing or edema noted bilaterally Skin, soft tissue: no rashes Neuro: nonfocal  CBC:  CBC Latest Ref Rng 02/03/2015 02/02/2015 02/01/2015  WBC 4.0 - 10.5 K/uL 8.4 7.5 6.3  Hemoglobin 12.0 - 15.0 g/dL 7.7(L) 8.1(L) 7.4(L)  Hematocrit 36.0 - 46.0 % 23.8(L) 25.0(L) 22.7(L)  Platelets  150 - 400 K/uL 175 198 147(L)      BMET  Recent Labs  02/02/15 1020 02/03/15 0444  NA 135 135  K 3.2* 3.2*  CL 101 102  CO2 27 28  GLUCOSE 116* 108*  BUN 11 14  CREATININE 1.00 1.03*  CALCIUM 7.9* 7.9*     Liver Panel  No results for input(s): PROT, ALBUMIN, AST, ALT, ALKPHOS, BILITOT, BILIDIR, IBILI in the last 72 hours.     Sedimentation Rate No results for input(s): ESRSEDRATE in the last 72 hours. C-Reactive Protein No results for input(s): CRP in the last 72 hours.  Micro Results: Recent Results (from the past 720 hour(s))  Culture, blood (routine x 2)     Status: None   Collection Time: 01/06/15 10:42 AM  Result Value Ref Range Status   Specimen Description BLOOD LEFT ANTECUBITAL  Final   Special Requests BOTTLES DRAWN AEROBIC AND ANAEROBIC 5ML  Final   Culture  Setup Time   Final    GRAM NEGATIVE RODS IN BOTH AEROBIC AND ANAEROBIC BOTTLES CRITICAL RESULT CALLED TO, READ BACK BY AND VERIFIED WITH: [name] @0235  01/07/15 [name]    Culture   Final    KLEBSIELLA PNEUMONIAE SUSCEPTIBILITIES PERFORMED ON PREVIOUS CULTURE WITHIN THE LAST 5 DAYS. Performed at [facility]    Report Status 01/10/2015 FINAL  Final  Urine culture     Status: None   Collection Time: 01/06/15 10:44 AM  Result Value Ref Range Status   Specimen Description URINE, CATHETERIZED  Final   Special Requests NONE  Final   Culture   Final    NO GROWTH 1 DAY Performed at [facility]    Report Status 01/07/2015 FINAL  Final  Culture, blood (routine x 2)     Status: None   Collection Time: 01/06/15 11:00 AM  Result Value Ref Range Status   Specimen Description BLOOD PORTA CATH  Final   Special Requests BOTTLES DRAWN AEROBIC AND ANAEROBIC 5CC  Final   Culture  Setup Time   Final    GRAM NEGATIVE RODS IN BOTH AEROBIC AND ANAEROBIC BOTTLES CRITICAL RESULT CALLED TO, READ BACK BY AND VERIFIED WITH: [name] RN 2129 01/06/15 [name]    Culture   Final    KLEBSIELLA  PNEUMONIAE ESCHERICHIA COLI Performed at [facility]    Report Status 01/09/2015 FINAL  Final   Organism ID, Bacteria KLEBSIELLA PNEUMONIAE  Final   Organism ID, Bacteria ESCHERICHIA COLI  Final      Susceptibility   Escherichia coli - MIC*    AMPICILLIN 8 SENSITIVE Sensitive     CEFAZOLIN <=4 SENSITIVE Sensitive     CEFEPIME <=1 SENSITIVE Sensitive     CEFTAZIDIME <=1 SENSITIVE Sensitive  CEFTRIAXONE <=1 SENSITIVE Sensitive     CIPROFLOXACIN <=0.25 SENSITIVE Sensitive     GENTAMICIN <=1 SENSITIVE Sensitive     IMIPENEM <=0.25 SENSITIVE Sensitive     TRIMETH/SULFA <=20 SENSITIVE Sensitive     AMPICILLIN/SULBACTAM 4 SENSITIVE Sensitive     PIP/TAZO <=4 SENSITIVE Sensitive     * ESCHERICHIA COLI   Klebsiella pneumoniae - MIC*    AMPICILLIN >=32 RESISTANT Resistant     CEFAZOLIN <=4 SENSITIVE Sensitive     CEFEPIME <=1 SENSITIVE Sensitive     CEFTAZIDIME <=1 SENSITIVE Sensitive     CEFTRIAXONE <=1 SENSITIVE Sensitive     CIPROFLOXACIN <=0.25 SENSITIVE Sensitive     GENTAMICIN <=1 SENSITIVE Sensitive     IMIPENEM <=0.25 SENSITIVE Sensitive     TRIMETH/SULFA <=20 SENSITIVE Sensitive     AMPICILLIN/SULBACTAM 4 SENSITIVE Sensitive     PIP/TAZO <=4 SENSITIVE Sensitive     * KLEBSIELLA PNEUMONIAE  MRSA PCR Screening     Status: None   Collection Time: 01/06/15  4:38 PM  Result Value Ref Range Status   MRSA by PCR NEGATIVE NEGATIVE Final    Comment:        The GeneXpert MRSA Assay (FDA approved for NASAL specimens only), is one component of a comprehensive MRSA colonization surveillance program. It is not intended to diagnose MRSA infection nor to guide or monitor treatment for MRSA infections.   Culture, body fluid-bottle     Status: None   Collection Time: 01/06/15  7:12 PM  Result Value Ref Range Status   Specimen Description FLUID BILE  Final   Special Requests RUQ  Final   Gram Stain   Final    GRAM VARIABLE ROD IN BOTH AEROBIC AND ANAEROBIC  BOTTLES CRITICAL RESULT CALLED TO, READ BACK BY AND VERIFIED WITH: BARHAM @0509  01/07/15 [name]    Culture   Final    ENTEROBACTER CLOACAE MODIFIED HODGE TEST=POSITIVE CRITICAL RESULT CALLED TO, READ BACK BY AND VERIFIED WITH: Orville Govern 01/11/15 @ 1053 M VESTAL Performed at [facility]    Report Status 01/11/2015 FINAL  Final   Organism ID, Bacteria ENTEROBACTER CLOACAE  Final      Susceptibility   Enterobacter cloacae - MIC*    CEFAZOLIN >=64 RESISTANT Resistant     CEFEPIME 2 RESISTANT Resistant     CEFTAZIDIME >=64 RESISTANT Resistant     CEFTRIAXONE >=64 RESISTANT Resistant     CIPROFLOXACIN <=0.25 SENSITIVE Sensitive     GENTAMICIN <=1 SENSITIVE Sensitive     IMIPENEM <=0.25 RESISTANT Resistant     TRIMETH/SULFA <=20 SENSITIVE Sensitive     PIP/TAZO >=128 RESISTANT Resistant     * ENTEROBACTER CLOACAE  Gram stain     Status: None   Collection Time: 01/06/15  7:12 PM  Result Value Ref Range Status   Specimen Description FLUID BILE  Final   Special Requests RUQ  Final   Gram Stain   Final    CYTOSPIN SMEAR BUDDING YEAST SEEN GRAM VARIABLE ROD GRAM POSITIVE COCCI IN CLUSTERS CRITICAL RESULT CALLED TO, READ BACK BY AND VERIFIED WITH: [name] @0235  01/07/15 [name]    Report Status 01/07/2015 FINAL  Final  Culture, blood (routine x 2)     Status: None   Collection Time: 01/09/15 12:20 PM  Result Value Ref Range Status   Specimen Description BLOOD RIGHT ARM  Final   Special Requests IN PEDIATRIC BOTTLE 5CC  Final   Culture   Final    NO GROWTH 5 DAYS  Performed at [facility]    Report Status 01/14/2015 FINAL  Final  Culture, blood (routine x 2)     Status: None   Collection Time: 01/09/15 12:34 PM  Result Value Ref Range Status   Specimen Description BLOOD RIGHT ARM  Final   Special Requests BOTTLES DRAWN AEROBIC AND ANAEROBIC 10CC  Final   Culture   Final    NO GROWTH 5 DAYS Performed at [facility]    Report Status 01/14/2015 FINAL   Final  Blood culture (routine single)     Status: None   Collection Time: 01/20/15  3:21 PM  Result Value Ref Range Status   Blood Culture, Routine Culture, Blood  Final    Comment: Final - ===== FINAL REPORT =====NO GROWTH 5 DAYS  Urine Culture     Status: None   Collection Time: 01/20/15  3:21 PM  Result Value Ref Range Status   Urine Culture, Routine Culture, Urine  Final    Comment: Final - ===== COLONY COUNT: ===== 60,000 COLONIES/ML ESCHERICHIA COLI  ------------------------------------------------------------------------  ESCHERICHIA COLI     AMPICILLIN                       MIC      Resistant       >=32 ug/ml    AMOX/CLAVULANIC                  MIC      Indeterminate     16 ug/ml    AMPICILLIN/SUL                   MIC      Resistant       >=32 ug/ml    PIPERACILLIN/TAZO                MIC      Resistant      >=128 ug/ml    IMIPENEM                         MIC      Sensitive     <=0.25 ug/ml    CEFAZOLIN                        MIC      Resistant          8 ug/ml    CEFTRIAXONE                      MIC      Sensitive        <=1 ug/ml    CEFTAZIDIME                      MIC      Sensitive        <=1 ug/ml    CEFEPIME                         MIC      Sensitive        <=1 ug/ml    GENTAMICIN                       MIC      Sensitive        <=1 ug/ml    TOBRAMYCIN  MIC      Sensitive        <=1 ug/ml    CIPROFLOXACIN                     MIC      Resistant        >=4 ug/ml    LEVOFLOXACIN                     MIC      Resistant        >=8 ug/ml    NITROFURANTOIN                   MIC      Sensitive       <=16 ug/ml    TRIMETH/SULFA                    MIC      Resistant      >=320 ug/ml  ------------------------------------------------------------------------ NR=NOT REPORTABLE,SEE COMMENT ORAL therapy:A cefazolin MIC of <32 predicts  susceptibility to the oral agents cefaclor, cefdinir,cefpodoxime,cefprozil,cefuroxime, cephalexin,and loracarbef when used for  therapy  of uncomplicated UTIs due to E.coli,K.pneumomiae, and P.mirabilis. PARENTERAL therapy: A cefazolin MIC of >8 indicates resistance to parenteral cefazolin. An alternate test method must be performed to confirm susceptibility to parenteral cefazolin. END OF REPORT   Blood culture (routine single)     Status: None   Collection Time: 01/20/15  3:22 PM  Result Value Ref Range Status   Blood Culture, Routine Culture, Blood  Final    Comment: ------------------------------------------------------------------------Gram Stain Report Called to,Read Back Byand Verified With:ROSALYND W @ 9:39 AM10/28/16 BY DWEEKSFinal - ===== FINAL REPORT =====KLEBSIELLA  PNEUMONIAE------------------------------------------------------------------------ KLEBSIELLA PNEUMONIAE   AMPICILLIN                       MIC      Resistant       >=32 ug/ml   AMOX/CLAVULANIC                  MIC      Sensitive        <=2 ug/ml    AMPICILLIN/SUL                   MIC      Sensitive          4 ug/ml   PIPERACILLIN/TAZO                MIC      Sensitive          8 ug/ml   IMIPENEM                         MIC      Sensitive     <=0.25 ug/ml   CEFAZOLIN                        MIC       NR               <=4 ug/ml   CEFTRIAXONE                      MIC      Sensitive        <=1 ug/ml   CEFTAZIDIME  MIC      Sensitive        <=1 ug/ml   CEFEPIME                         MIC      Sensitive        <=1 ug/ml   GENTAMICIN                        MI C      Sensitive        <=1 ug/ml   TOBRAMYCIN                       MIC      Sensitive        <=1 ug/ml   CIPROFLOXACIN                    MIC      Indeterminate        ug/ml   LEVOFLOXACIN                     MIC       Indeterminate      4 ug/ml   TRIMETH/SULFA                    MIC      Sensitive         40 ug/ml------------------------------------------------------------------------NR=NOT REPORTABLE,SEE COMMENTORAL therapy:A cefazolin MIC of <32 predicts  susceptibility to  the oral agents cefaclor,cefdinir,cefpodoxime,cefprozil,cefuroxime,cephalexin,and loracarbef when used for therapy of uncomplicated UTIs due to E.coli,K.pneumomiae,and P.mirabilis. PARENTERAL therapy: A cefazolinMIC of >8 indicates  resistance to parenteralcefazolin. An alternate test method must beperformed to confirm susceptibility to parenteralcefazolin.END OF REPORT   Urine culture     Status: None   Collection Time: 01/20/15  7:30 PM  Result Value Ref Range Status   Specimen Description URINE, RANDOM  Final   Special Requests NONE  Final   Culture   Final    9,000 COLONIES/mL INSIGNIFICANT GROWTH Performed at [facility]    Report Status 01/22/2015 FINAL  Final  Culture, blood (routine x 2)     Status: None   Collection Time: 01/25/15  6:49 PM  Result Value Ref Range Status   Specimen Description BLOOD RIGHT HAND  Final   Special Requests IN PEDIATRIC BOTTLE 3CC  Final   Culture   Final    NO GROWTH 5 DAYS Performed at [facility]    Report Status 01/30/2015 FINAL  Final  Culture, blood (routine x 2)     Status: None   Collection Time: 01/25/15  6:49 PM  Result Value Ref Range Status   Specimen Description BLOOD LEFT ANTECUBITAL  Final   Special Requests BOTTLES DRAWN AEROBIC AND ANAEROBIC 10CC EA  Final   Culture   Final    NO GROWTH 5 DAYS Performed at [facility]    Report Status 01/30/2015 FINAL  Final  C difficile quick scan w PCR reflex     Status: None   Collection Time: 01/30/15 10:34 PM  Result Value Ref Range Status   C Diff antigen NEGATIVE NEGATIVE Final   C Diff toxin NEGATIVE NEGATIVE Final   C Diff interpretation Negative for toxigenic C. difficile  Final  Urine culture     Status: None   Collection Time: 02/01/15  3:12 PM  Result Value Ref Range Status  Specimen Description KIDNEY RIGHT CYSTOSCOPY  Final   Special Requests PATIENT ON FOLLOWING UNASYN  Final   Culture   Final    NO GROWTH 1 DAY Performed at [facility]    Report Status 02/02/2015 FINAL  Final    Studies/Results: No results found.    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 She was dc on cipro and now readmitted with septic picture with biliary area thought to be source of infection and + blood culture  #1 Biliary infection + bacteremia:  for now continue IV unasyn.  planning on changing to augmentin at DC  #2 Obstructive lesion sp cystoscopy and stent placement and cultures se Urine is sterile  I will arrange [facility] in our clinic in the next 2-3 weeks   LOS: 14 days   Alcide Evener 02/03/2015, 3:37 PM

## 2015-02-04 ENCOUNTER — Other Ambulatory Visit: Payer: Self-pay | Admitting: Nurse Practitioner

## 2015-02-04 ENCOUNTER — Telehealth: Payer: Self-pay | Admitting: Nurse Practitioner

## 2015-02-04 DIAGNOSIS — R5081 Fever presenting with conditions classified elsewhere: Secondary | ICD-10-CM

## 2015-02-04 DIAGNOSIS — C169 Malignant neoplasm of stomach, unspecified: Secondary | ICD-10-CM

## 2015-02-04 LAB — URINALYSIS, ROUTINE W REFLEX MICROSCOPIC
Bilirubin Urine: NEGATIVE
Glucose, UA: NEGATIVE mg/dL
Ketones, ur: NEGATIVE mg/dL
Nitrite: NEGATIVE
Protein, ur: NEGATIVE mg/dL
SPECIFIC GRAVITY, URINE: 1.022 (ref 1.005–1.030)
UROBILINOGEN UA: 0.2 mg/dL (ref 0.0–1.0)
pH: 7 (ref 5.0–8.0)

## 2015-02-04 LAB — BASIC METABOLIC PANEL
Anion gap: 7 (ref 5–15)
BUN: 14 mg/dL (ref 6–20)
CHLORIDE: 103 mmol/L (ref 101–111)
CO2: 26 mmol/L (ref 22–32)
CREATININE: 1.28 mg/dL — AB (ref 0.44–1.00)
Calcium: 7.5 mg/dL — ABNORMAL LOW (ref 8.9–10.3)
GFR calc non Af Amer: 51 mL/min — ABNORMAL LOW (ref >60)
GFR, EST AFRICAN AMERICAN: 59 mL/min — AB (ref >60)
Glucose, Bld: 96 mg/dL (ref 65–99)
Potassium: 3.7 mmol/L (ref 3.5–5.1)
Sodium: 136 mmol/L (ref 135–145)

## 2015-02-04 LAB — URINE MICROSCOPIC-ADD ON

## 2015-02-04 NOTE — Telephone Encounter (Signed)
Called patient with 11/15 appointments and email to [facility] to move chemo closer if possible

## 2015-02-04 NOTE — Progress Notes (Signed)
IP PROGRESS NOTE  Subjective:  no nausea or vomiting. The right flank and lower abdominal pain have improved. She reports adequate pain control with the current narcotic regimen. She had a fever last night. Pain at the right upper abdomen with a deep inspiration. Objective: Vital signs in last 24 hours: Temp:  [98.3 F (36.8 C)-101.1 F (38.4 C)] 98.4 F (36.9 C) (11/10 1413) Pulse Rate:  [98-112] 112 (11/10 1413) Resp:  [189] 189 (11/09 2053) BP: (102-113)/(57-62) 113/62 mmHg (11/10 1413) SpO2:  [100 %] 100 % (11/10 1413)  Intake/Output from previous day: 11/09 0701 - 11/10 0700 In: 410 [I.V.:10; IV Piggyback:400] Out: 1600 [Urine:500; Drains:1100] Intake/Output this shift: Total I/O In: 100 [IV Piggyback:100] Out: 550 [Urine:150; Drains:400]   Lungs: good air movement bilaterally, no respiratory distress Cardiac: regular rate and rhythm Abdomen soft and nontender. No hepatomegaly. Right upper quadrant drain site with a gauze dressing No leg edema.   Port-A-Cath without erythema.  Lab Results:   Recent Labs  02/02/15 1020 02/03/15 0444  WBC 7.5 8.4  HGB 8.1* 7.7*  HCT 25.0* 23.8*  PLT 198 175   BMET  Recent Labs  02/03/15 0444 02/04/15 0340  NA 135 136  K 3.2* 3.7  CL 102 103  CO2 28 26  GLUCOSE 108* 96  BUN 14 14  CREATININE 1.03* 1.28*  CALCIUM 7.9* 7.5*   lactic acid 2.4  Studies/Results: No results found.  Medications: Reviewed  Assessment/Plan: 1. Gastric cancer status post upper endoscopy 09/22/2013 with findings of a partially obstructing oozing cratered gastric ulcer in the gastric antrum.  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   CT scans chest/abdomen/pelvis 10/03/2013 showed a 7 x 5 mm groundglass opacity in the superior segment of the right lower lobe; a 4.5 mm nodular density in the right upper lobe; bilateral axillary adenopathy; low density area anteriorly in the left hepatic lobe most  consistent with fatty infiltration; another hypodense area within the medial segment of the left hepatic lobe; severe gastric distention; mild bilateral inguinal adenopathy.   Subtotal gastrectomy and lymph node dissection with creation of a gastrojejunostomy 10/10/2013. No evidence of distant metastatic disease noted at the time of surgery. No evidence of liver metastases. 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 Tumor involved the serosal surface. Resection margins were negative. 10 of 17 lymph nodes were positive for metastatic adenocarcinoma. Her-2 not amplified   Cycle 1 weekly 5-FU/leucovorin 11/14/2013, dose reduced beginning with week 3 secondary to mucositis and hand/foot syndrome.   Cycle 2 weekly 5-FU/leucovorin beginning 12/19/2013.  Initiation of radiation/Xeloda 01/26/2014. She decided to discontinue further radiation/Xeloda after one fraction due to significant nausea/vomiting.  Cycle 3 weekly 5-FU/leucovorin beginning 02/13/2014  Cycle 4 weekly 5-FU/leucovorin beginning 03/18/2014  Cycle 5 weekly 5-FU/leucovorin beginning 04/28/2014  CTs 06/16/2014 with new enhancing nodular lesions along the ventral peritoneal surface  CT-guided biopsy of an anterior peritoneal lesion on 06/24/2014 confirmed poorly differentiated adenocarcinoma  CTs 09/03/2014 with an increasing confluent enhancing lesion located at the ventral peritoneal surface midline of the anterior abdomen. Lesion inseparable from the posterior aspect of the abdominal wall.  CT abdomen/pelvis 11/18/2014 with significant progression of metastatic disease with enlarging metastasis within the anterior abdominal wall, enlargement of multiple peritoneal implants, interval development of masses within the pancreatic head and caudate lobe of the liver. 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 New complex  solid and cystic left adnexal lesion.  Placement   of percutaneous internal/external biliary drain 11/19/2014  Cycle 1 FOLFOX 12/09/2014  Cycle 2 FOLFOX 12/23/2014  CT 01/06/2015 with a decrease in the anterior abdominal wall tumor 2. Gastric outlet obstruction secondary to #1. Resolved. 3. Nausea/vomiting secondary to #2. Resolved. 4. Abdominal pain secondary to #1. 5. History of weight loss. 6. Microcytic anemia, likely iron deficiency. She received iron dextran 10/11/2013. 7. Lupus/Sjgren's. 8. Pneumonia during hospitalization July 2015.  9. Abdominal abscesses with body fluid culture 10/20/2013 showing moderate Candida tropicalis. She was discharged home on a 4 week course of fluconazole.  10. Mucositis and hand/foot syndrome following cycle 1-week #3 5-FU/leucovorin, the 5-FU and leucovorin were dose reduced. 11. 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 12. Status post LEEP 11/07/2013. 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 Pap smear 06/08/2014-negative for intraepithelial lesions or malignancy 13. Gram-negative sepsis 01/06/2015-blood cultures positive for Klebsiella/Escherichia coli, culture from biliary drainage positive for Enterobacter 14. New right hydronephrosis on CT 01/06/2015, stable on ultrasound 01/21/2015 , mildly progressive right hydronephrosis secondary to any elongated obstructing mass on the CT 01/30/2015 status post placement of a right ureter stent 02/01/2015 15. Admission 01/20/2015 with fever/sepsis, one blood culture positive for Klebsiella pneumoniae, urine culture positive for a resistant Escherichia coli    She had a fever last night. She otherwise appears stable. Plan for discharge on Augmentin if Dr. Tommy Medal agrees.  it is possible the fever is related to " tumor fever ", but concern for a biliary or urinary infection remains.   Recommendations: 1. Continue antibiotics as recommended  by infectious disease 2. Continue MS Contin and oxycodone as for breakthrough pain 3.  Outpatient follow-up at the [facility] will be scheduled for 02/09/2015.    LOS: 15 days    [place], ANP/GNP-BC 02/04/2015 2:40 PM

## 2015-02-04 NOTE — Progress Notes (Signed)
[facility] PROGRESS NOTE  Andrea Best [id number] DOB: 23-May-1970 DOA: 01/20/2015 PCP: Milagros Evener, MD    HPI/Subjective: Had a temperature of 101.1 last night about 8 PM. We will see what Dr. Drucilla Schmidt recommendation to release her or to keep her for observation.  Brief history: 44 year old lady with h/o gastric cancer s/p partial gastrectomy with metastatic disease, with last chemo therapy in the first week of October, and recent external biliary drain placement for obstruction on 9/29, presented to ED in the first week of October for sepsis. During the hospitalization she grew klebsiella and E coli in the blood and Enterobacter species ( CRE) from the biliary drain  (cultures were drawn from the biliary drain by IR). All of the bacteria were sensitive to ciprofloxacin and she was discharged on oral ciprofloxacin on 10/17 to complete the course.  She presents to cancer center on 10/26 for sepsis and acute renal failure from dehydration and this time her urine cultures grew 60,000 colonies resistant to ciprofloxacin, but sensitive to Augmentin and one blood culture growing klebsiella ,pan sensitive.  Dr.Akula d/w Dr Linus Salmons for recommendations, started her on Avycaz. She has been on Avycaz from 10/28.  ID Dr Tommy Medal consulted., she was started on IV Unasyn Meanwhile during her hospitalization she was having recurrent vomiting. Dr Learta Codding recommended upper GI series, unremarkable CT abd delayed pending-pending clearing of barium, showed increased Hydronephrosis and ureteral obstruction, Consulted Urology, s/p R ureteral stent 11/7  Assessment/Plan: Sepsis  -improving, was febrile, hypotensive and tachycardic on admission, Lactic acid elevated.  -Repeat lactic acid normalized after fluid boluses.  -Blood cultures 10/26 grew klebsiella 1/2 . Urine cultures grew 60,000 e coli resistent to ciprofloxacin.  -repeat Blood Cx 10/31: negative -Dr.VanDam following and started on IV  Unasyn 11/3,   -last admission with Ecoli and klebsiella bacteremia and Enterobacter in biliary fluid -clinically much improved, CT was delayed since she had to Barium in her bowel from Upper GI series,  CT abd 11/5 with increase Hydronephrosis and ureteral obstruction, Consulted Urology, seen by Dr.Eskridge, underwent R ureteral stent 11/7. -will monitor, repeat Blood culture if spikes again  Metastatic gastric Ca -Chemo on hold -followed by Dr.Sherril  Anemia of chronic disease and chemo -mild worsening, asymptomatic, given Aranesp 11/6 -she is a jehovah's witness and will not receive blood products.   Malignant biliary obstruction  -s/p I/E biliary drain 11/19/14 with last exchange on 12/24/14; recent biliary sepsis -IR following and drain felt to be functioning well -drain care, FU with IR to make decision regarding biliary stent  Nausea/Vomiting, Abd pain -due to above, supportive care,  -improved, tolerating diet now -CT as discussed above -upper Gi series unremarkable  Sjogren's disease: plaquenil on hold.   Acute renal failure: -possibly from the hypotension and decreased renal perfusion. Improving with hydration. Cut down IVF.   Asthma; -no wheezing  Constipation -Added bowel regimen. This is likely secondary to opioid pain medications.  DVT proph: lovenox  Code Status: full code. Family Communication: mother at bedside.  Disposition Plan: home when stable   Consultants:  Oncology Dr Benay Spice.  ID Dr Tommy Medal  IR  Procedures: None  Antibiotics:  avycaz 10/28 till date.    vancomycin and zosyn -10/26 till 10/27  IV cipro on 10/27 to 10/28.     Objective: Filed Vitals:   02/04/15 0512  BP: 102/57  Pulse: 98  Temp: 98.5 F (36.9 C)  Resp:     Intake/Output Summary (Last 24 hours) at  02/04/15 1152 Last data filed at 02/04/15 0700  Gross per 24 hour  Intake    410 ml  Output   1600 ml  Net  -1190 ml   Filed Weights   01/20/15 1744   Weight: 60.056 kg (132 lb 6.4 oz)    Exam:   General:  uncomfortable, AAOx3  Cardiovascular: s1s2  Respiratory: ctab,   Abdomen: soft ND BS+, biliary drain, mild flank tenderness  Musculoskeletal: no pedal edema.   Data Reviewed: Basic Metabolic Panel:  Recent Labs Lab 01/31/15 0425 02/01/15 0505 02/02/15 1020 02/03/15 0444 02/04/15 0340  NA 136 136 135 135 136  K 3.2* 3.4* 3.2* 3.2* 3.7  CL 105 102 101 102 103  CO2 26 28 27 28 26   GLUCOSE 94 90 116* 108* 96  BUN 9 11 11 14 14   CREATININE 1.09* 1.14* 1.00 1.03* 1.28*  CALCIUM 7.7* 8.0* 7.9* 7.9* 7.5*   Liver Function Tests: No results for input(s): AST, ALT, ALKPHOS, BILITOT, PROT, ALBUMIN in the last 168 hours. No results for input(s): LIPASE, AMYLASE in the last 168 hours. No results for input(s): AMMONIA in the last 168 hours. CBC:  Recent Labs Lab 01/30/15 1105 01/31/15 0425 02/01/15 0505 02/02/15 1020 02/03/15 0444  WBC 8.5 5.5 6.3 7.5 8.4  HGB 8.2* 6.9* 7.4* 8.1* 7.7*  HCT 25.1* 21.4* 22.7* 25.0* 23.8*  MCV 82.8 82.3 81.9 82.2 82.4  PLT 173 141* 147* 198 175   Cardiac Enzymes: No results for input(s): CKTOTAL, CKMB, CKMBINDEX, TROPONINI in the last 168 hours. BNP (last 3 results)  Recent Labs  01/06/15 1450  BNP 108.2*    ProBNP (last 3 results) No results for input(s): PROBNP in the last 8760 hours.  CBG: No results for input(s): GLUCAP in the last 168 hours.  Recent Results (from the past 240 hour(s))  Culture, blood (routine x 2)     Status: None   Collection Time: 01/25/15  6:49 PM  Result Value Ref Range Status   Specimen Description BLOOD RIGHT HAND  Final   Special Requests IN PEDIATRIC BOTTLE 3CC  Final   Culture   Final    NO GROWTH 5 DAYS Performed at [facility]    Report Status 01/30/2015 FINAL  Final  Culture, blood (routine x 2)     Status: None   Collection Time: 01/25/15  6:49 PM  Result Value Ref Range Status   Specimen Description BLOOD LEFT  ANTECUBITAL  Final   Special Requests BOTTLES DRAWN AEROBIC AND ANAEROBIC 10CC EA  Final   Culture   Final    NO GROWTH 5 DAYS Performed at [facility]    Report Status 01/30/2015 FINAL  Final  C difficile quick scan w PCR reflex     Status: None   Collection Time: 01/30/15 10:34 PM  Result Value Ref Range Status   C Diff antigen NEGATIVE NEGATIVE Final   C Diff toxin NEGATIVE NEGATIVE Final   C Diff interpretation Negative for toxigenic C. difficile  Final  Urine culture     Status: None   Collection Time: 02/01/15  3:12 PM  Result Value Ref Range Status   Specimen Description KIDNEY RIGHT CYSTOSCOPY  Final   Special Requests PATIENT ON FOLLOWING UNASYN  Final   Culture   Final    NO GROWTH 1 DAY Performed at [facility]    Report Status 02/02/2015 FINAL  Final     Studies: No results found.  Scheduled Meds: . ampicillin-sulbactam (  UNASYN) IV  3 g Intravenous Q6H  . darbepoetin (ARANESP) injection - NON-DIALYSIS  100 mcg Subcutaneous Once  . enoxaparin (LOVENOX) injection  40 mg Subcutaneous Q24H  . feeding supplement (PRO-STAT SUGAR FREE 64)  30 mL Oral TID BM  . ferrous sulfate  325 mg Oral BID WC  . lactose free nutrition  237 mL Oral TID WC  . morphine  15 mg Oral Q12H  . multivitamin with minerals  1 tablet Oral Daily  . 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

## 2015-02-05 DIAGNOSIS — R1114 Bilious vomiting: Secondary | ICD-10-CM

## 2015-02-05 LAB — URINE CULTURE: Culture: NO GROWTH

## 2015-02-05 LAB — HEPATIC FUNCTION PANEL
ALBUMIN: 2.1 g/dL — AB (ref 3.5–5.0)
ALK PHOS: 84 U/L (ref 38–126)
ALT: 13 U/L — AB (ref 14–54)
AST: 22 U/L (ref 15–41)
BILIRUBIN TOTAL: 0.8 mg/dL (ref 0.3–1.2)
Bilirubin, Direct: <0.1 mg/dL — ABNORMAL LOW (ref 0.1–0.5)
Total Protein: 7.2 g/dL (ref 6.5–8.1)

## 2015-02-05 MED ORDER — AMOXICILLIN-POT CLAVULANATE 875-125 MG PO TABS: 1.0000 | ORAL_TABLET | Freq: Two times a day (BID) | ORAL | Status: DC

## 2015-02-05 MED ORDER — OXYCODONE HCL 5 MG PO TABS
10.0000 mg | ORAL_TABLET | ORAL | Status: DC | PRN
  Administered 2015-02-05: 10 mg via ORAL
  Administered 2015-02-05: 15 mg via ORAL
  Filled 2015-02-05: qty 2
  Filled 2015-02-05: qty 3

## 2015-02-05 MED ORDER — ENSURE ENLIVE PO LIQD: 237.0000 mL | Freq: Two times a day (BID) | ORAL | Status: DC

## 2015-02-05 MED ORDER — HEPARIN SOD (PORK) LOCK FLUSH 100 UNIT/ML IV SOLN
500.0000 [IU] | INTRAVENOUS | Status: AC | PRN
  Administered 2015-02-05: 500 [IU]

## 2015-02-05 NOTE — Progress Notes (Signed)
Left flank blilary drain suture site loose.  Changed dressing around site and noticed sutures were not attached and the tube is slipping. SRP, RN

## 2015-02-05 NOTE — Discharge Summary (Signed)
Physician Discharge Summary  CAMILY FREID [id number] DOB: 1970-07-25 DOA: 01/20/2015  PCP: Milagros Evener, MD  Admit date: 01/20/2015 Discharge date: 02/05/2015  Time spent: 40 minutes  Recommendations for Outpatient Follow-up:  1. Follow-up with Dr. Benay Spice next week. 2. Augmentin 875/125 mg twice a day for 2 more weeks.  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 female with h/o metastatic gastric cancer s/p resection , external biliary drain for obstruction on 9/29, recent admission for E COLI and klebsiella sepsis still on ciprofloxacin, presents today for fevers, hypotension, fatigue and vomiting since one week. She presented to cancer center for chemo and was found to be hypotensive, received fluid boluses, but spiked fever, and became tachycardic. Her bp paramters improved but was referred to medical service for admission for sepsis for possible UTI.   Hospital Course:   Sepsis  -Came in with a fever of 102, heart rate of 130 and known intra-abdominal infection.  -Blood cultures 10/26 grew klebsiella 1/2 . Urine cultures grew 60,000 e coli resistent to ciprofloxacin.  -repeat Blood Cx 10/31: negative -Dr.VanDam following and started on IV Unasyn 11/3,  -last admission with Ecoli and klebsiella bacteremia and Enterobacter in biliary fluid -clinically much  improved, CT was delayed since she had to Barium in her bowel from Upper GI series, CT abd 11/5 with increase Hydronephrosis and ureteral obstruction, Consulted Urology, seen by Dr.Eskridge, underwent R ureteral stent 11/7. -ID recommended Augmentin and discharged, Augmentin for 14 more days and discharged.  Metastatic gastric carcinoma -Chemo on hold -followed by Dr.Sherrill, hopefully chemotherapy will be restarted next week.  Anemia of chronic disease and chemo  -mild worsening, asymptomatic, given Aranesp 11/6, follow-up with oncology, she might need IV iron infusion as outpatient. 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   Malignant biliary obstruction  -s/p I/E biliary drain 11/19/14 with last exchange on 12/24/14; recent biliary sepsis -IR following and drain felt to be functioning well -Followed by IR, on health nurse for the drain care.  Nausea/Vomiting, Abd pain -due to above, supportive care,  -improved, tolerating diet now -CT as discussed above -upper Gi series unremarkable  Sjogren's disease: plaquenil on hold.   Acute renal failure: -possibly from the hypotension and decreased renal perfusion. Improving with hydration. Cut down IVF.   Asthma; -no wheezing  Constipation This is resolved after laxatives, likely secondary to opioid pain medications.   Procedures:  Placement of right-sided ureteral stent, cystoscopy and right retrograde pyelogram done by Dr. Junious Silk on 02/01/2015.  Consultations:  ID  Oncology  Interventional radiology.  Urology  Discharge Exam: Filed Vitals:   02/05/15 0434  BP: 104/63  Pulse: 117  Temp: 98.2 F (36.8 C)  Resp: 18   General: Alert and awake, oriented x3, not in any acute distress. HEENT: anicteric sclera, pupils reactive to light  and accommodation, EOMI CVS: S1-S2 clear, no murmur rubs or gallops Chest: clear to auscultation bilaterally, no wheezing, rales or rhonchi Abdomen: soft  nontender, nondistended, normal bowel sounds, no organomegaly Extremities: no cyanosis, clubbing or edema noted bilaterally Neuro: Cranial nerves II-XII intact, no focal neurological deficits   Discharge Instructions   Discharge Instructions    Diet - low sodium heart healthy    Complete by:  As directed      Diet general    Complete by:  As directed      Increase activity slowly    Complete by:  As directed      Increase activity slowly    Complete by:  As directed           Current Discharge Medication List    START taking these medications   Details  amoxicillin-clavulanate (AUGMENTIN) 875-125 MG tablet Take 1 tablet by mouth 2 (two) times daily. For 30days Qty: 28 tablet, Refills: 0    oxyCODONE (ROXICODONE) 5 MG/5ML solution Take 10-20 mLs (10-20 mg total) by mouth every 4 (four) hours as needed for moderate pain or severe pain. Qty: 473 mL, Refills: 0   Associated Diagnoses: Malignant neoplasm of stomach, unspecified location ([place])      CONTINUE these medications which have CHANGED   Details  morphine (MS CONTIN) 15 MG 12 hr tablet Take 1 tablet (15 mg total) by mouth every 12 (twelve) hours. Qty: 60 tablet, Refills: 0   Associated Diagnoses: Gastric cancer ([place])      CONTINUE these medications which have NOT CHANGED   Details  Artificial Tear Ointment (DRY EYES OP) Apply 2 drops to eye daily as needed (dry eyes).    dexamethasone (DECADRON) 4 MG tablet Take 2 tablets (8 mg total) by mouth 2 (two) times daily. Take 8 mg twice a day for two days beginning the day after chemotherapy. Qty: 16 tablet, Refills: 1   Associated Diagnoses: Gastric cancer (HCC)    DiphenhydrAMINE HCl (ZZZQUIL) 50 MG/30ML LIQD Take 30 mLs by mouth at bedtime as needed (for sleep).    feeding supplement (BOOST / RESOURCE BREEZE) LIQD Take 1 Container by mouth 2 (two) times daily between meals. Refills: 0    FERROUS SULFATE PO Take 1 tablet by mouth daily.    hydroxychloroquine  (PLAQUENIL) 200 MG tablet Take 1 tablet (200 mg total) by mouth 2 (two) times daily. Refills: 0    ondansetron (ZOFRAN) 4 MG tablet Take 1 tablet (4 mg total) by mouth every 8 (eight) hours as needed for nausea or vomiting. Qty: 20 tablet, Refills: 3    PRESCRIPTION MEDICATION Chemo - [facility]    LORazepam (ATIVAN) 0.5 MG tablet Take 1 tablet (0.5 mg total) by mouth every 8 (eight) hours as needed for sleep (nausea). Qty: 30 tablet, Refills: 0      STOP taking these medications     ciprofloxacin (CIPRO) 750 MG tablet      ibuprofen (ADVIL,MOTRIN) 200 MG tablet      TPN ADULT        Allergies  Allergen Reactions  . Compazine [Prochlorperazine Maleate] Other (See Comments)    Dystonia  . Azithromycin Rash  . Sulfa Antibiotics Rash    hallucinations  . Hydrocodone-Acetaminophen Nausea And Vomiting    Hallucinating   . 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 SHE DOES NOT WANT ANY BLOOD PRODUCTS OR FRACTIONS   Follow-up Information    Follow up with Betsy Coder, MD In 1  week.   Specialty:  Oncology   Contact information:   [place] [id number] [phone]       Follow up with Festus Aloe, MD.   Specialty:  Urology   Why:  5 - 7 weeks    Contact information:   [place] [id number] [phone]        The results of significant diagnostics from this hospitalization (including imaging, microbiology, ancillary and laboratory) are listed below for reference.    Significant Diagnostic Studies: Dg Chest 2 View  01/06/2015  CLINICAL DATA:  Weakness and fatigue starting last night. Low back pain. Diarrhea. Sepsis. Chemotherapy. Lupus. EXAM: CHEST  2 VIEW COMPARISON:  11/21/2014 FINDINGS: Power injectable Port-A-Cath tip:  Cavoatrial junction. Low lung volumes are present, causing crowding of the pulmonary vasculature. Bandlike airspace opacity along the right hemidiaphragm favoring atelectasis based on configuration. No pleural  effusion identified. Pigtail drain over the right upper quadrant compatible with biliary drain. IMPRESSION: 1. Bandlike opacity at the right lung base favoring atelectasis based on configuration. 2. Percutaneous biliary drain. 3. Port-A-Cath tip:  Cavoatrial junction. Electronically Signed   By: Van Clines M.D.   On: 01/06/2015 12:02   Dg Abd 1 View  01/30/2015  CLINICAL DATA:  45 year old female with a history of abdominal pain. EXAM: ABDOMEN - 1 VIEW COMPARISON:  01/29/2015 FINDINGS: Compared to prior, there has been evacuation of residual enteric contrast within the colon, with no retained enteric contrast. No abnormally distended small bowel or colon. Pigtail drainage catheter in the right upper quadrant unchanged. Surgical clips of the right upper quadrant. No unexpected calcifications or radiopaque foreign body. IMPRESSION: Interval evacuation of enteric contrast from the colon with no residual contrast. Surgical drain unchanged in place within the right upper quadrant. Signed, Dulcy Fanny. Earleen Newport, DO [facility] Electronically Signed   By: Corrie Mckusick D.O.   On: 01/30/2015 09:00   Dg Abd 1 View  01/29/2015  CLINICAL DATA:  44 year old female scheduled to undergo CT scan. Evaluating volume of residual barium. EXAM: ABDOMEN - 1 VIEW COMPARISON:  Prior abdominal radiograph 01/28/2015 FINDINGS: New persistent but decreasing retained barium in the colon. There is been and approximately 50% reduction in the amount of retained barium since the prior study. Well-positioned right internal/ external biliary drain. No evidence of obstruction. No acute osseous abnormality. IMPRESSION: Approximately 50% decrease in retained barium throughout the colon compared to 01/28/2015. There is likely still enough retained barium to result in significant CT image degradation from streak artifact. Recommend repeat abdominal radiograph in 24 or 48 hours.  Electronically Signed   By: Jacqulynn Cadet M.D.   On: 01/29/2015 10:45   Dg Abd 1 View  01/28/2015  CLINICAL DATA:  Abdominal pain.  Gastric carcinoma. EXAM: ABDOMEN - 1 VIEW COMPARISON:  01/26/2015 FINDINGS: Percutaneous biliary drainage catheter remains in expected position. Surgical clips again seen in the right upper quadrant. No evidence of dilated bowel loops. A moderate amount of residual oral contrast material is seen throughout the majority of the colon. IMPRESSION: Moderate residual oral contrast throughout the colon. Otherwise normal bowel gas pattern. Electronically Signed   By: Earle Gell M.D.   On: 01/28/2015 12:52   Ct Abdomen Pelvis W Contrast  01/30/2015  CLINICAL DATA:  Sepsis. Status post partial gastrectomy for metastatic gastric carcinoma. Last chemotherapy in the first week of October of this year. Recent external biliary drain for obstruction. Acute renal failure. Vomiting.  EXAM: CT ABDOMEN AND PELVIS WITH CONTRAST TECHNIQUE: Multidetector CT imaging of the abdomen and pelvis was performed using the standard protocol following bolus administration of intravenous contrast. CONTRAST:  52mL OMNIPAQUE IOHEXOL 300 MG/ML SOLN, 153mL OMNIPAQUE IOHEXOL 300 MG/ML SOLN COMPARISON:  Previous examinations, including the abdomen and pelvis CT dated 01/06/2015. FINDINGS: Lower chest:  Bibasilar atelectasis with mild progression. Hepatobiliary: Significantly improved pericholecystic fluid and edema. A percutaneous biliary drainage catheter remains in place. Intrahepatic biliary ductal dilatation has not changed significantly. Pancreas: No significant change in diffuse pancreatic ductal dilatation with a maximum diameter of 3 mm on image 10. Spleen: Stable small oval area of low density laterally. Adrenals/Urinary Tract: Moderate dilatation of the right renal collecting system and proximal ureter with mild progression. There is abnormal soft tissue density in the proximal ureter, measuring 1.6 x 1.3  cm on image number 49 with an appearance compatible with no inhomogeneous enhancement. On coronal image number 66, this extends along an approximately 8 cm long segment of the ureter. Unremarkable left kidney, left ureter and urinary bladder. Stomach/Bowel: Previously noted partial gastrectomy changes. Left mid abdominal bowel anastomosis. No dilated small bowel loops. Oral contrast in normal caliber colon. No evidence of appendicitis. Vascular/Lymphatic: Multiple mildly prominent retroperitoneal lymph nodes without significant change. No abnormal enlarged lymph nodes. Reproductive: Enlarged, heterogeneous uterus. Enlarged, heterogeneous left ovary containing a 3.8 cm cyst without significant change. Unremarkable right ovary. Other: Small amount of free peritoneal fluid in the pelvis. Further decrease in mild anterior abdominal wall soft tissue thickening with a 4.3 x 1.2 cm residual area of midline soft tissue thickening on image number 41. This is in the a midline surgical scar. Musculoskeletal:  No suspicious bone lesions identified. IMPRESSION: 1. Mildly progressive right hydronephrosis due to an elongated obstructing mass or area of marked inflammation and infection in the proximal to mid right ureter. 2. Further decrease in size of the previously demonstrated mass in the midline along the anterior abdominal wall. 3. Stable enlarged, heterogeneous uterus compatible with multiple fibroids. 4. Stable enlarged, heterogeneous left ovary containing a 3.8 cm cyst. 5. Small amount of free peritoneal fluid in the pelvis. 6. Grossly stable biliary ductal dilatation and pancreatic ductal dilatation. 7. Mildly progressive bibasilar atelectasis. Electronically Signed   By: Claudie Revering M.D.   On: 01/30/2015 17:49   Ct Abdomen Pelvis W Contrast  01/06/2015  CLINICAL DATA:  Current history of gastric cancer. Fever. Low back pain. EXAM: CT ABDOMEN AND PELVIS WITH CONTRAST TECHNIQUE: Multidetector CT imaging of the abdomen  and pelvis was performed using the standard protocol following bolus administration of intravenous contrast. CONTRAST:  162mL OMNIPAQUE IOHEXOL 300 MG/ML  SOLN COMPARISON:  CT scan of November 22, 2014. FINDINGS: Visualized lung bases are unremarkable. No significant osseous abnormality is noted. No gallstones are noted. Percutaneous biliary drainage catheter is again noted and unchanged in position compared to prior exam. No significant change in mild intrahepatic biliary dilatation is noted. Stable splenomegaly is noted. Stable mild pancreatic ductal dilatation is noted. Status post partial gastrectomy. No evidence of bowel obstruction is noted. The appendix appears normal. Adrenal glands and left kidney appear normal. Mild right hydronephrosis and proximal ureteral dilatation is noted ; there is noted an area of abnormal enhancement involving the mid ureter which may represent neoplastic involvement. Stable left ovarian cyst is noted. Stable fibroid is noted in uterine fundus. No abnormal fluid collection is noted. Urinary bladder appears normal. There is continued presence of abnormal soft tissue density seen  along the anterior abdominal wall which is decreased compared to prior exam, consistent with improving peritoneal metastatic focus. Stable soft tissue density is seen in the porta hepatis region consistent with metastatic disease. IMPRESSION: Stable splenomegaly. Percutaneous biliary drainage catheter is unchanged in position compared to prior exam, with no significant change in mild intrahepatic biliary dilatation or pancreatic ductal dilatation. Status post partial gastrectomy. Stable left ovarian cyst is noted compared to prior exam. Continued presence of abnormal soft tissue density seen along the anterior abdominal wall which is decreased compared to prior exam, consistent with improving peritoneal metastatic focus. Stable soft tissue density seen in porta hepatis region consistent with metastatic  disease. Interval development of mild right hydronephrosis and proximal ureteral dilatation without obstructing calculus. Enhancing soft tissue abnormality is seen involving the midportion of the right ureter concerning for metastatic disease. Electronically Signed   By: Marijo Conception, M.D.   On: 01/06/2015 13:36   US Renal  01/21/2015  CLINICAL DATA:  Follow-up right hydronephrosis. History of gastric cancer. EXAM: RENAL / URINARY TRACT ULTRASOUND COMPLETE COMPARISON:  CT 01/06/2015 FINDINGS: Right Kidney: Length: 12.5 cm. There continues to be mild to moderate right hydronephrosis with dilatation of the proximal right ureter. The proximal right ureter measures up to 1.5 cm and similar to the previous CT. Left Kidney: Length: 13.2 cm. Echogenicity within normal limits. No mass or hydronephrosis visualized. Bladder: Fluid in the urinary bladder with a normal appearance. IMPRESSION: Persistent mild to moderate right hydronephrosis. Minimal change from the previous CT. Electronically Signed   By: Markus Daft M.D.   On: 01/21/2015 16:55   Dg Ugi W/small Bowel  01/26/2015  CLINICAL DATA:  44 year old female with history of gastric cancer post partial gastrectomy. Last chemotherapy first week of October. Persistent vomiting. Subsequent encounter. EXAM: UPPER GI SERIES WITH SMALL BOWEL FOLLOW-THROUGH FLUOROSCOPY TIME:  Fluoroscopy Time 3 minutes and 3 seconds. TECHNIQUE: Single contrast upper GI series using thin barium. Subsequently, serial images of the small bowel were obtained including spot views of the terminal ileum. COMPARISON:  01/06/2015 CT. FINDINGS: Scout view reveals surgical staples left upper quadrant and surgical clips right upper quadrant with biliary drain in place (pigtail portion within the duodenum). No esophageal obstructing lesion. Mild impression by aortic arch. No mucosal abnormality although double contrast technique was not able to be utilized. Post partial gastrectomy. Gastric remnant  without obvious mass although evaluation limited by single contrast technique. Contrast traversed but freely through the gastric remnant into small bowel anastomosis. Slow transit time through small bowel without obstructing lesion identified. IMPRESSION: Post partial gastrectomy. Immediate transit of contrast through gastric remnant into small bowel anastomosis. Slow transit through small bowel without obstructing lesion noted. Single contrast technique had to be utilized secondary to patient's nausea. Electronically Signed   By: Genia Del M.D.   On: 01/26/2015 16:39    Microbiology: Recent Results (from the past 240 hour(s))  C difficile quick scan w PCR reflex     Status: None   Collection Time: 01/30/15 10:34 PM  Result Value Ref Range Status   C Diff antigen NEGATIVE NEGATIVE Final   C Diff toxin NEGATIVE NEGATIVE Final   C Diff interpretation Negative for toxigenic C. difficile  Final  Urine culture     Status: None   Collection Time: 02/01/15  3:12 PM  Result Value Ref Range Status   Specimen Description KIDNEY RIGHT CYSTOSCOPY  Final   Special Requests PATIENT ON FOLLOWING UNASYN  Final   Culture  Final    NO GROWTH 1 DAY Performed at [facility]    Report Status 02/02/2015 FINAL  Final     Labs: Basic Metabolic Panel:  Recent Labs Lab 01/31/15 0425 02/01/15 0505 02/02/15 1020 02/03/15 0444 02/04/15 0340  NA 136 136 135 135 136  K 3.2* 3.4* 3.2* 3.2* 3.7  CL 105 102 101 102 103  CO2 26 28 27 28 26   GLUCOSE 94 90 116* 108* 96  BUN 9 11 11 14 14   CREATININE 1.09* 1.14* 1.00 1.03* 1.28*  CALCIUM 7.7* 8.0* 7.9* 7.9* 7.5*   Liver Function Tests:  Recent Labs Lab 02/05/15 0530  AST 22  ALT 13*  ALKPHOS 84  BILITOT 0.8  PROT 7.2  ALBUMIN 2.1*   No results for input(s): LIPASE, AMYLASE in the last 168 hours. No results for input(s): AMMONIA in the last 168 hours. CBC:  Recent Labs Lab 01/30/15 1105 01/31/15 0425 02/01/15 0505 02/02/15 1020  02/03/15 0444  WBC 8.5 5.5 6.3 7.5 8.4  HGB 8.2* 6.9* 7.4* 8.1* 7.7*  HCT 25.1* 21.4* 22.7* 25.0* 23.8*  MCV 82.8 82.3 81.9 82.2 82.4  PLT 173 141* 147* 198 175   Cardiac Enzymes: No results for input(s): CKTOTAL, CKMB, CKMBINDEX, TROPONINI in the last 168 hours. BNP: BNP (last 3 results)  Recent Labs  01/06/15 1450  BNP 108.2*    ProBNP (last 3 results) No results for input(s): PROBNP in the last 8760 hours.  CBG: No results for input(s): GLUCAP in the last 168 hours.     Signed:  Katalia Choma A  [facility] 02/05/2015, 10:46 AM

## 2015-02-05 NOTE — Progress Notes (Signed)
Nutrition Follow-up  DOCUMENTATION CODES:   Non-severe (moderate) malnutrition in context of chronic illness  INTERVENTION:  - Continue Prostat TID - Will d/c Boost Plus - Will order Ensure Enlive BID, each supplement provides 350 kcal and 20 grams of protein - RD will continue to monitor for needs  NUTRITION DIAGNOSIS:   Increased nutrient needs related to catabolic illness as evidenced by estimated needs, percent weight loss. -ongoing  GOAL:   Patient will meet greater than or equal to 90% of their needs, Weight gain -mainly met for needs, no new weight since 10/26  MONITOR:   PO intake, Supplement acceptance, Weight trends, Labs, Skin, I & O's  ASSESSMENT:   44 y.o. female with h/o metastatic gastric cancer s/p resection , external biliary drain for obstruction on 9/29, recent admission for E COLI and klebsiella sepsis still on ciprofloxacin, presents today for fevers, hypotension, fatigue and vomiting since one week. She presented to cancer center for chemo and was found to be hypotensive, received fluid boluses, but spiked fever, and became tachycardic. Her bp paramters improved but was referred to medical service for admission for sepsis for possible UTI.   11/11 Per chart review, pt ate 75% of breakfast and 60% of lunch yesterday (11/10). She reports improved appetite throughout hospitalization and that she has not been eating well recently. She denies any nausea or abdominal pain recently. Pt is thankful for Prostat and states she typically consumes 2 of them/day although they are sweeter than she would prefer.  Talked with her about Boost Plus and she states she has been trying to drink them and will continue to try but that chocolate in general causes her some GI upset. Informed her about Ensure Enlive and the various flavor options as well as increased protein in this supplement compared to Boost Plus. Pt very open to receiving Ensure Enlive instead of Boost Plus. Will  discuss supplement preferences and changes with [facility] RD as pt has been working with her in the past.  Likely meeting needs on average. Medications reviewed. Labs reviewed; creatinine elevated and trending up, Ca: 7.5 mg/dL, GFR: 58.     11/8  - Pt PO intake is still poor 0 - 100%, Sepsis is improving. - Denies Nausea/Vomitting/Abd Pain, improved from earlier in hospitalization where she was throwing up each day. - Pt reports poor appetite related to overall sickness. - Pt does have 135m of green suction via NG at this point related to biliary obstruction s/p biliary drain 8/25 & last exchange 9/29, recent biliary sepsis.   11/3 - Patient has a h/o of s/p gastric resection with gastrojeuosteomy (09/2013), and recent external biliary drain placement for obstruction on 12/24/2014.  - Patient denied abdominal pain, swallowing difficulty, or nausea / vomiting today.  - She noted recent taste changes upon Oncology diagnosis, but stated her taste buds are returning to normal. - Patient stated that she does have increased sensitivity to smells and this can lead to increased nausea.  - Patient's appetite has been low since hospitalization and reported being much better PTA. - Meal completion is 0-100% (with mother sharing some foods on tray).  - Patient and family were concerned about meeting nutrition needs.  - [facility] good compliance.  - Patient was recently on TPN during her last admission (01/06/2015 - 01/11/2015).  - Per chart, TPN was stopped on 01/12/2015 with good oral intake.  - RD student encouraged patient to continue  eating energy dense, high protein foods in small, frequent meals throughout the day.  - We also talked about trying cold foods during times when aroma is increasing nausea and resulting in limited PO intake.  - Patient has good compliance with Boost Breeze, but is willing to try Boost Plus to trail for  tolerance.  - Patient is willing to sample Pro-Stat supplements as well.  - Patient described a history of weight loss since oncology diagnosis. Prior, her usual weight was 150 lbs, and has been trending down to 132 lbs.  - Per chart, patient has experienced a significant weight change (13%/ 3 months).  - NFPE performed - patient has mild muscle wasting: clavicles, temple, shoulder/acromion, and mild edema.    Diet Order:  Diet general Diet regular Room service appropriate?: Yes; Fluid consistency:: Thin Diet - low sodium heart healthy  Skin:  Reviewed, no issues  Last BM:  11/9  Height:   Ht Readings from Last 1 Encounters:  01/20/15 5' 6"  (1.676 m)    Weight:   Wt Readings from Last 1 Encounters:  01/20/15 132 lb 6.4 oz (60.056 kg)    Ideal Body Weight:   59.09 kg  BMI:  Body mass index is 21.38 kg/(m^2).  Estimated Nutritional Needs:   Kcal:  2122-4825  Protein:  80-90 gm  Fluid:  1.8-2.1 L/day  EDUCATION NEEDS:   Education needs addressed     Jarome Matin, RD, LDN Inpatient Clinical Dietitian Pager # [phone] After hours/weekend pager # [phone]

## 2015-02-05 NOTE — Progress Notes (Signed)
Referring Physician(s): CCM  Chief Complaint:  Biliary drain in place Nursing reports "not secured to skin"  Subjective:  Patient doing well and is eager to go home.  Nursing reports that drain is not secured to skin and worries it may become dislodged.  Allergies: Compazine; Azithromycin; Sulfa antibiotics; Hydrocodone-acetaminophen; and Other  Medications: Prior to Admission medications   Medication Sig Start Date End Date Taking? Authorizing Provider  Artificial Tear Ointment (DRY EYES OP) Apply 2 drops to eye daily as needed (dry eyes).   Yes Historical Provider, MD  ciprofloxacin (CIPRO) 750 MG tablet Take 1 tablet (750 mg total) by mouth 2 (two) times daily. 01/11/15  Yes Hosie Poisson, MD  dexamethasone (DECADRON) 4 MG tablet Take 2 tablets (8 mg total) by mouth 2 (two) times daily. Take 8 mg twice a day for two days beginning the day after chemotherapy. 12/23/14  Yes Owens Shark, NP  DiphenhydrAMINE HCl (ZZZQUIL) 50 MG/30ML LIQD Take 30 mLs by mouth at bedtime as needed (for sleep).   Yes Historical Provider, MD  feeding supplement (BOOST / RESOURCE BREEZE) LIQD Take 1 Container by mouth 2 (two) times daily between meals. Patient taking differently: Take 1 Container by mouth daily.  01/11/15  Yes Hosie Poisson, MD  FERROUS SULFATE PO Take 1 tablet by mouth daily.   Yes Historical Provider, MD  hydroxychloroquine (PLAQUENIL) 200 MG tablet Take 1 tablet (200 mg total) by mouth 2 (two) times daily. 01/21/15  Yes Hosie Poisson, MD  ibuprofen (ADVIL,MOTRIN) 200 MG tablet Take 200-600 mg by mouth every 6 (six) hours as needed for headache or moderate pain.   Yes Historical Provider, MD  ondansetron (ZOFRAN) 4 MG tablet Take 1 tablet (4 mg total) by mouth every 8 (eight) hours as needed for nausea or vomiting. 12/02/14  Yes Owens Shark, NP  [place]   Yes Historical Provider, MD  amoxicillin-clavulanate (AUGMENTIN) 875-125 MG tablet Take 1 tablet by  mouth 2 (two) times daily. For 30days 02/05/15   Verlee Monte, MD  LORazepam (ATIVAN) 0.5 MG tablet Take 1 tablet (0.5 mg total) by mouth every 8 (eight) hours as needed for sleep (nausea). 01/20/15   Ladell Pier, MD  morphine (MS CONTIN) 15 MG 12 hr tablet Take 1 tablet (15 mg total) by mouth every 12 (twelve) hours. 01/30/15   Domenic Polite, MD  oxyCODONE (ROXICODONE) 5 MG/5ML solution Take 10-20 mLs (10-20 mg total) by mouth every 4 (four) hours as needed for moderate pain or severe pain. 01/30/15   Domenic Polite, MD  TPN ADULT Inject into the vein continuous. [place]    Historical Provider, MD     Vital Signs: BP 104/63 mmHg  Pulse 117  Temp(Src) 98.2 F (36.8 C) (Oral)  Resp 18  Ht 5\' 6"  (1.676 m)  Wt 132 lb 6.4 oz (60.056 kg)  BMI 21.38 kg/m2  SpO2 100%  LMP 11/23/2014 (Approximate)  Physical Exam   Biliary drain in place, however there is no Stat-Lock and the suture present around the drain is not in the skin. It appears to be draining well.   I secured the drain with a Stat-Lock  Imaging: No results found.  Labs:  CBC:  Recent Labs  01/31/15 0425 02/01/15 0505 02/02/15 1020 02/03/15 0444  WBC 5.5 6.3 7.5 8.4  HGB 6.9* 7.4* 8.1* 7.7*  HCT 21.4* 22.7* 25.0* 23.8*  PLT 141* 147* 198 175    COAGS:  Recent  Labs  06/24/14 1142 11/19/14 0714 12/24/14 0905 01/06/15 1450  INR 1.04 1.15 1.18 1.56*  APTT 30  --   --  36    BMP:  Recent Labs  02/01/15 0505 02/02/15 1020 02/03/15 0444 02/04/15 0340  NA 136 135 135 136  K 3.4* 3.2* 3.2* 3.7  CL 102 101 102 103  CO2 28 27 28 26   GLUCOSE 90 116* 108* 96  BUN 11 11 14 14   CALCIUM 8.0* 7.9* 7.9* 7.5*  CREATININE 1.14* 1.00 1.03* 1.28*  GFRNONAA 58* >60 >60 51*  GFRAA >60 >60 >60 59*    LIVER FUNCTION TESTS:  Recent Labs  01/21/15 1142 01/23/15 0510 01/28/15 0350 02/05/15 0530  BILITOT 1.1 1.2 0.8 0.8  AST 23 39 20 22  ALT 14 13* 10* 13*  ALKPHOS 87 80 67 84    PROT 6.2* 6.4* 6.5 7.2  ALBUMIN 2.1* 2.0* 2.2* 2.1*    Assessment and Plan:  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  Keep scheduled appt for drain exchange on 02/23/2015  Signed: Abigail Butts S Alexi Geibel PA-C 02/05/2015, 10:51 AM   I spent a total of 15 Minutes at the the patient's bedside AND on the patient's hospital floor or unit, greater than 50% of which was counseling/coordinating care for follow up of biliary drain.

## 2015-02-05 NOTE — Progress Notes (Signed)
Patient discharged home with belongings, AVS printed and teach back performed. Prescriptions given. Patient stable at time of discharge.

## 2015-02-07 ENCOUNTER — Other Ambulatory Visit: Payer: Self-pay | Admitting: Oncology

## 2015-02-09 ENCOUNTER — Ambulatory Visit (HOSPITAL_BASED_OUTPATIENT_CLINIC_OR_DEPARTMENT_OTHER): Payer: 59 | Admitting: Nurse Practitioner

## 2015-02-09 ENCOUNTER — Telehealth: Payer: Self-pay | Admitting: Oncology

## 2015-02-09 ENCOUNTER — Other Ambulatory Visit (HOSPITAL_COMMUNITY)
Admission: RE | Admit: 2015-02-09 | Discharge: 2015-02-09 | Disposition: A | Discharge status: Home / Self Care | Payer: 59 | Source: Ambulatory Visit | Attending: Oncology | Admitting: Oncology

## 2015-02-09 ENCOUNTER — Ambulatory Visit: Payer: 59 | Admitting: Nutrition

## 2015-02-09 ENCOUNTER — Ambulatory Visit: Payer: 59

## 2015-02-09 ENCOUNTER — Ambulatory Visit (HOSPITAL_BASED_OUTPATIENT_CLINIC_OR_DEPARTMENT_OTHER): Payer: 59

## 2015-02-09 ENCOUNTER — Other Ambulatory Visit (HOSPITAL_BASED_OUTPATIENT_CLINIC_OR_DEPARTMENT_OTHER): Payer: 59

## 2015-02-09 VITALS — BP 122/69 | HR 131 | Temp 98.8°F | Resp 18 | Ht 66.0 in | Wt 136.3 lb

## 2015-02-09 DIAGNOSIS — C169 Malignant neoplasm of stomach, unspecified: Secondary | ICD-10-CM | POA: Insufficient documentation

## 2015-02-09 DIAGNOSIS — E876 Hypokalemia: Secondary | ICD-10-CM | POA: Diagnosis not present

## 2015-02-09 DIAGNOSIS — Z95828 Presence of other vascular implants and grafts: Secondary | ICD-10-CM

## 2015-02-09 DIAGNOSIS — R197 Diarrhea, unspecified: Secondary | ICD-10-CM | POA: Diagnosis not present

## 2015-02-09 LAB — CBC WITH DIFFERENTIAL/PLATELET
BASO%: 0.3 % (ref 0.0–2.0)
BASOS ABS: 0 10*3/uL (ref 0.0–0.1)
EOS%: 0.5 % (ref 0.0–7.0)
Eosinophils Absolute: 0.1 10*3/uL (ref 0.0–0.5)
HEMATOCRIT: 25.4 % — AB (ref 34.8–46.6)
HEMOGLOBIN: 8.2 g/dL — AB (ref 11.6–15.9)
LYMPH#: 1.3 10*3/uL (ref 0.9–3.3)
LYMPH%: 10.9 % — ABNORMAL LOW (ref 14.0–49.7)
MCH: 26.3 pg (ref 25.1–34.0)
MCHC: 32.3 g/dL (ref 31.5–36.0)
MCV: 81.4 fL (ref 79.5–101.0)
MONO#: 1.4 10*3/uL — AB (ref 0.1–0.9)
MONO%: 12.2 % (ref 0.0–14.0)
NEUT#: 8.7 10*3/uL — ABNORMAL HIGH (ref 1.5–6.5)
NEUT%: 76.1 % (ref 38.4–76.8)
PLATELETS: 280 10*3/uL (ref 145–400)
RBC: 3.12 10*6/uL — ABNORMAL LOW (ref 3.70–5.45)
RDW: 16.4 % — AB (ref 11.2–14.5)
WBC: 11.5 10*3/uL — ABNORMAL HIGH (ref 3.9–10.3)

## 2015-02-09 LAB — COMPREHENSIVE METABOLIC PANEL (CC13)
ALT: 13 U/L (ref 0–55)
AST: 22 U/L (ref 5–34)
Albumin: 2.1 g/dL — ABNORMAL LOW (ref 3.5–5.0)
Alkaline Phosphatase: 107 U/L (ref 40–150)
Anion Gap: 9 mEq/L (ref 3–11)
BUN: 6.9 mg/dL — AB (ref 7.0–26.0)
CHLORIDE: 104 meq/L (ref 98–109)
CO2: 19 meq/L — AB (ref 22–29)
Calcium: 8.2 mg/dL — ABNORMAL LOW (ref 8.4–10.4)
Creatinine: 1.1 mg/dL (ref 0.6–1.1)
EGFR: 70 mL/min/{1.73_m2} — AB (ref >90)
GLUCOSE: 110 mg/dL (ref 70–140)
POTASSIUM: 3.2 meq/L — AB (ref 3.5–5.1)
Sodium: 133 mEq/L — ABNORMAL LOW (ref 136–145)
Total Bilirubin: 0.45 mg/dL (ref 0.20–1.20)
Total Protein: 7.6 g/dL (ref 6.4–8.3)

## 2015-02-09 LAB — C DIFFICILE QUICK SCREEN W PCR REFLEX
C DIFFICILE (CDIFF) INTERP: NEGATIVE
C Diff antigen: NEGATIVE
C Diff toxin: NEGATIVE

## 2015-02-09 MED ORDER — POTASSIUM CHLORIDE CRYS ER 20 MEQ PO TBCR: EXTENDED_RELEASE_TABLET | ORAL | Status: DC

## 2015-02-09 MED ORDER — SODIUM CHLORIDE 0.9 % IV SOLN
Freq: Once | INTRAVENOUS | Status: AC
  Administered 2015-02-09: 12:00:00 via INTRAVENOUS

## 2015-02-09 MED ORDER — SODIUM CHLORIDE 0.9 % IJ SOLN
10.0000 mL | INTRAMUSCULAR | Status: DC | PRN
  Administered 2015-02-09: 10 mL via INTRAVENOUS
  Filled 2015-02-09: qty 10

## 2015-02-09 MED ORDER — HEPARIN SOD (PORK) LOCK FLUSH 100 UNIT/ML IV SOLN
500.0000 [IU] | Freq: Once | INTRAVENOUS | Status: AC
  Administered 2015-02-09: 500 [IU] via INTRAVENOUS
  Filled 2015-02-09: qty 5

## 2015-02-09 NOTE — Progress Notes (Signed)
Patient did not receive chemotherapy today secondary to vomiting. I was unable to follow-up with patient before she left. New weight on November 15 documented as 136.3 pounds. Reviewed inpatient visit.  Noted patient consuming ensure Enlive twice a day. Follow-up scheduled on Wednesday, November 30, during chemotherapy

## 2015-02-09 NOTE — Telephone Encounter (Signed)
Gave and printed appt sched and avs for pt for NOV and DEC °

## 2015-02-09 NOTE — Progress Notes (Addendum)
[place] OFFICE PROGRESS NOTE   Diagnosis:  Gastric cancer  INTERVAL HISTORY:   Andrea Best returns as scheduled. She was discharged from the hospital 02/04/2015. Appetite has improved significantly. She is gaining weight. No fever since discharge. She continues antibiotics. She is having multiple loose stools a day since discharge. Imodium is not effective. She is periodically confused and has visual hallucinations. She is taking Ativan as needed for nausea and is on MS Contin and oxycodone for pain control. She mainly takes the oxycodone for pain at the right abdomen and right flank. She has low-grade pain at the left shoulder region with movement.  Objective:  Vital signs in last 24 hours:  Blood pressure 122/69, pulse 131, temperature 98.8 F (37.1 C), temperature source Oral, resp. rate 18, height 5' 6"  (1.676 m), weight 136 lb 4.8 oz (61.825 kg), last menstrual period 11/27/2014, SpO2 100 %.  Repeat heart rate 120  HEENT: No thrush or ulcers. Resp: Lungs clear bilaterally. Cardio: Regular, tachycardic. GI: No hepatomegaly. Nontender. Biliary drain right upper quadrant. Vascular: No leg edema. Musculoskeletal: Slight asymmetry, tenderness in the region of the left trapezius muscle. Nontender over the scapula/shoulder. Port-A-Cath without erythema.  Lab Results:  Lab Results  Component Value Date   WBC 11.5* 02/09/2015   HGB 8.2* 02/09/2015   HCT 25.4* 02/09/2015   MCV 81.4 02/09/2015   PLT 280 02/09/2015   NEUTROABS 8.7* 02/09/2015    Imaging:  No results found.  Medications: I have reviewed the patient's current medications.  Assessment/Plan: 1. Gastric cancer status post upper endoscopy 09/22/2013 with findings of a partially obstructing oozing cratered gastric ulcer in the gastric antrum.  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   CT scans chest/abdomen/pelvis 10/03/2013 showed a 7 x 5 mm  groundglass opacity in the superior segment of the right lower lobe; a 4.5 mm nodular density in the right upper lobe; bilateral axillary adenopathy; low density area anteriorly in the left hepatic lobe most consistent with fatty infiltration; another hypodense area within the medial segment of the left hepatic lobe; severe gastric distention; mild bilateral inguinal adenopathy.   Subtotal gastrectomy and lymph node dissection with creation of a gastrojejunostomy 10/10/2013. No evidence of distant metastatic disease noted at the time of surgery. No evidence of liver metastases. 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 Tumor involved the serosal surface. Resection margins were negative. 10 of 17 lymph nodes were positive for metastatic adenocarcinoma. Her-2 not amplified   Cycle 1 weekly 5-FU/leucovorin 11/14/2013, dose reduced beginning with week 3 secondary to mucositis and hand/foot syndrome.   Cycle 2 weekly 5-FU/leucovorin beginning 12/19/2013.  Initiation of radiation/Xeloda 01/26/2014. She decided to discontinue further radiation/Xeloda after one fraction due to significant nausea/vomiting.  Cycle 3 weekly 5-FU/leucovorin beginning 02/13/2014  Cycle 4 weekly 5-FU/leucovorin beginning 03/18/2014  Cycle 5 weekly 5-FU/leucovorin beginning 04/28/2014  CTs 06/16/2014 with new enhancing nodular lesions along the ventral peritoneal surface  CT-guided biopsy of an anterior peritoneal lesion on 06/24/2014 confirmed poorly differentiated adenocarcinoma  CTs 09/03/2014 with an increasing confluent enhancing lesion located at the ventral peritoneal surface midline of the anterior abdomen. Lesion inseparable from the posterior aspect of the abdominal wall.  CT abdomen/pelvis 11/18/2014 with significant progression of metastatic disease with enlarging metastasis within the anterior abdominal wall, enlargement of multiple peritoneal implants, interval development of  masses within the pancreatic head and caudate lobe of the liver. 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 New complex solid and cystic left adnexal lesion.  Placement of percutaneous internal/external biliary drain 11/19/2014  Cycle 1 FOLFOX 12/09/2014  Cycle 2 FOLFOX 12/23/2014  CT 01/06/2015 with a decrease in the anterior abdominal wall tumor 2. Gastric outlet obstruction secondary to #1. Resolved. 3. Nausea/vomiting secondary to #2. Resolved. 4. Abdominal pain secondary to #1. 5. History of weight loss. 6. Microcytic anemia, likely iron deficiency. She received iron dextran 10/11/2013. 7. Lupus/Sjgren's. 8. Pneumonia during hospitalization July 2015.  9. Abdominal abscesses with body fluid culture 10/20/2013 showing moderate Candida tropicalis. She was discharged home on a 4 week course of fluconazole.  10. Mucositis and hand/foot syndrome following cycle 1-week #3 5-FU/leucovorin, the 5-FU and leucovorin were dose reduced. 11. 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 12. Status post LEEP 11/07/2013. 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 Pap smear 06/08/2014-negative for intraepithelial lesions or malignancy 13. Gram-negative sepsis 01/06/2015-blood cultures positive for Klebsiella/Escherichia coli, culture from biliary drainage positive for Enterobacter 14. New right hydronephrosis on CT 01/06/2015, stable on ultrasound 01/21/2015, mildly progressive right hydronephrosis secondary to any elongated obstructing mass on the CT 01/30/2015 status post placement of a right ureter stent 02/01/2015 15. Admission 01/20/2015 with fever/sepsis, one blood culture positive for Klebsiella pneumoniae, urine culture positive for a resistant Escherichia coli   Disposition: Andrea Best appears stable. She has completed 2 cycles of FOLFOX. She  was recently hospitalized with fever/sepsis. A blood culture returned positive for Klebsiella and urine culture positive for a resistant Escherichia coli. She is completing antibiotics. She underwent placement of a right ureter stent for progressive right hydronephrosis on 02/01/2015. She was discharged 02/04/2015.   She is scheduled for cycle 3 FOLFOX today. On exam she is tachycardic. She will receive a 500 mL fluid bolus prior to repeating the heart rate. If improved the plan is to proceed with FOLFOX.  She is having diarrhea. We will obtain a stool sample for C. difficile testing today.  She has hypokalemia likely due to the diarrhea. She will begin K Dur with repeat labs in one week.  We scheduled a return visit in one week. She will contact the office in the interim with any problems.  Patient seen with Dr. Benay Spice. 25 minutes were spent face-to-face at today's visit with the majority of that time involved in counseling/coordination of care.    Ned Card ANP/GNP-BC   02/09/2015  10:28 AM This was a shared visit with Ned Card. Andrea Best was interviewed and examined. Chemotherapy will be held if the tachycardia does not improved following intravenous hydration. A stool sample was submitted for C. difficile testing.  Julieanne Manson, M.D.

## 2015-02-09 NOTE — Patient Instructions (Signed)

## 2015-02-09 NOTE — Progress Notes (Signed)
Vs after 500cc bolus, 132/69 HR 126. Spoke with Lattie Haw and Dr. Benay Spice, patient to receive 500 cc more and then discharge home. After receiving fluids, patient vomited large amount of emesis. States she feels better after vomiting. Stool specimen for c diff taken to lab.

## 2015-02-10 ENCOUNTER — Telehealth: Payer: Self-pay

## 2015-02-10 NOTE — Telephone Encounter (Signed)
-----   Message from Owens Shark, NP sent at 02/09/2015  4:33 PM EST ----- Please let her know stool was negative for C. difficile.

## 2015-02-10 NOTE — Telephone Encounter (Signed)
As per Owens Shark message relayed to Ms. Kawecki stool was negative for C diff. Pt. Verbalized understanding.

## 2015-02-11 ENCOUNTER — Ambulatory Visit: Payer: 59

## 2015-02-11 ENCOUNTER — Telehealth: Payer: Self-pay | Admitting: Oncology

## 2015-02-11 ENCOUNTER — Other Ambulatory Visit: Payer: Self-pay | Admitting: Nurse Practitioner

## 2015-02-11 DIAGNOSIS — C169 Malignant neoplasm of stomach, unspecified: Secondary | ICD-10-CM

## 2015-02-11 NOTE — Telephone Encounter (Signed)
Spoke with patient and she is aware of her 11/18 appointment

## 2015-02-12 ENCOUNTER — Ambulatory Visit (HOSPITAL_BASED_OUTPATIENT_CLINIC_OR_DEPARTMENT_OTHER): Payer: 59 | Admitting: Oncology

## 2015-02-12 ENCOUNTER — Telehealth: Payer: Self-pay | Admitting: Oncology

## 2015-02-12 ENCOUNTER — Other Ambulatory Visit (HOSPITAL_BASED_OUTPATIENT_CLINIC_OR_DEPARTMENT_OTHER): Payer: 59

## 2015-02-12 ENCOUNTER — Other Ambulatory Visit: Payer: Self-pay | Admitting: *Deleted

## 2015-02-12 VITALS — BP 119/70 | HR 115 | Temp 98.4°F | Resp 18 | Ht 66.0 in | Wt 131.6 lb

## 2015-02-12 DIAGNOSIS — C169 Malignant neoplasm of stomach, unspecified: Secondary | ICD-10-CM

## 2015-02-12 LAB — COMPREHENSIVE METABOLIC PANEL (CC13)
ALBUMIN: 2 g/dL — AB (ref 3.5–5.0)
ALK PHOS: 126 U/L (ref 40–150)
ALT: 17 U/L (ref 0–55)
AST: 30 U/L (ref 5–34)
Anion Gap: 7 mEq/L (ref 3–11)
BILIRUBIN TOTAL: 0.73 mg/dL (ref 0.20–1.20)
BUN: 5.5 mg/dL — ABNORMAL LOW (ref 7.0–26.0)
CALCIUM: 8.4 mg/dL (ref 8.4–10.4)
CO2: 25 mEq/L (ref 22–29)
Chloride: 104 mEq/L (ref 98–109)
Creatinine: 1.1 mg/dL (ref 0.6–1.1)
EGFR: 75 mL/min/{1.73_m2} — AB (ref >90)
Glucose: 94 mg/dl (ref 70–140)
POTASSIUM: 3.7 meq/L (ref 3.5–5.1)
Sodium: 136 mEq/L (ref 136–145)
Total Protein: 7.5 g/dL (ref 6.4–8.3)

## 2015-02-12 LAB — CBC WITH DIFFERENTIAL/PLATELET
BASO%: 0.2 % (ref 0.0–2.0)
BASOS ABS: 0 10*3/uL (ref 0.0–0.1)
EOS ABS: 0.1 10*3/uL (ref 0.0–0.5)
EOS%: 0.7 % (ref 0.0–7.0)
HEMATOCRIT: 27.7 % — AB (ref 34.8–46.6)
HEMOGLOBIN: 9.1 g/dL — AB (ref 11.6–15.9)
LYMPH#: 1 10*3/uL (ref 0.9–3.3)
LYMPH%: 11.4 % — ABNORMAL LOW (ref 14.0–49.7)
MCH: 26.8 pg (ref 25.1–34.0)
MCHC: 32.9 g/dL (ref 31.5–36.0)
MCV: 81.5 fL (ref 79.5–101.0)
MONO#: 1 10*3/uL — AB (ref 0.1–0.9)
MONO%: 12 % (ref 0.0–14.0)
NEUT#: 6.5 10*3/uL (ref 1.5–6.5)
NEUT%: 75.7 % (ref 38.4–76.8)
PLATELETS: 327 10*3/uL (ref 145–400)
RBC: 3.4 10*6/uL — ABNORMAL LOW (ref 3.70–5.45)
RDW: 17.6 % — AB (ref 11.2–14.5)
WBC: 8.6 10*3/uL (ref 3.9–10.3)

## 2015-02-12 MED ORDER — HYDROXYCHLOROQUINE SULFATE 200 MG PO TABS: 200.0000 mg | ORAL_TABLET | Freq: Two times a day (BID) | ORAL | Status: DC

## 2015-02-12 MED ORDER — SACCHAROMYCES BOULARDII 250 MG PO CAPS: 500.0000 mg | ORAL_CAPSULE | Freq: Two times a day (BID) | ORAL | Status: DC

## 2015-02-12 NOTE — Progress Notes (Signed)
[facility] OFFICE PROGRESS NOTE   Diagnosis: Gastric cancer  INTERVAL HISTORY:   Andrea Best returns as scheduled. Chemotherapy was held earlier this week secondary to diarrhea, tachycardia, and nausea/vomiting. She reports no further vomiting. Good appetite. She continues to have multiple small volume loose stools per day. She is taking Imodium only once daily. She continues Augmentin. No fever. She becomes "loopy "after taking oxycodone for pain. The pain is in the upper mid abdomen and at the biliary drain site.  Objective:  Vital signs in last 24 hours:  Blood pressure 119/70, pulse 115, temperature 98.4 F (36.9 C), temperature source Oral, resp. rate 18, height 5' 6" (1.676 m), weight 131 lb 9.6 oz (59.693 kg), last menstrual period 11/27/2014, SpO2 100 %.    HEENT: No thrush Resp: Decreased breath sounds at the right lower chest, no respiratory distress Cardio: Regular rate and rhythm GI: Mild tenderness in the mid upper abdomen, no mass, right biliary drain site without evidence of infection Vascular: No leg edema   Portacath/PICC-without erythema  Lab Results:  Lab Results  Component Value Date   WBC 8.6 02/12/2015   HGB 9.1* 02/12/2015   HCT 27.7* 02/12/2015   MCV 81.5 02/12/2015   PLT 327 02/12/2015   NEUTROABS 6.5 02/12/2015   potassium 3.7, BUN 5.5, creatinine 1.1, calcium 8.4  Imaging:  No results found.  Medications: I have reviewed the patient's current medications.  Assessment/Plan: 1. Gastric cancer status post upper endoscopy 09/22/2013 with findings of a partially obstructing oozing cratered gastric ulcer in the gastric antrum.  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   CT scans chest/abdomen/pelvis 10/03/2013 showed a 7 x 5 mm groundglass opacity in the superior segment of the right lower lobe; a 4.5 mm nodular density in the right upper lobe; bilateral axillary adenopathy; low density  area anteriorly in the left hepatic lobe most consistent with fatty infiltration; another hypodense area within the medial segment of the left hepatic lobe; severe gastric distention; mild bilateral inguinal adenopathy.   Subtotal gastrectomy and lymph node dissection with creation of a gastrojejunostomy 10/10/2013. No evidence of distant metastatic disease noted at the time of surgery. No evidence of liver metastases. 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 Tumor involved the serosal surface. Resection margins were negative. 10 of 17 lymph nodes were positive for metastatic adenocarcinoma. Her-2 not amplified   Cycle 1 weekly 5-FU/leucovorin 11/14/2013, dose reduced beginning with week 3 secondary to mucositis and hand/foot syndrome.   Cycle 2 weekly 5-FU/leucovorin beginning 12/19/2013.  Initiation of radiation/Xeloda 01/26/2014. She decided to discontinue further radiation/Xeloda after one fraction due to significant nausea/vomiting.  Cycle 3 weekly 5-FU/leucovorin beginning 02/13/2014  Cycle 4 weekly 5-FU/leucovorin beginning 03/18/2014  Cycle 5 weekly 5-FU/leucovorin beginning 04/28/2014  CTs 06/16/2014 with new enhancing nodular lesions along the ventral peritoneal surface  CT-guided biopsy of an anterior peritoneal lesion on 06/24/2014 confirmed poorly differentiated adenocarcinoma  CTs 09/03/2014 with an increasing confluent enhancing lesion located at the ventral peritoneal surface midline of the anterior abdomen. Lesion inseparable from the posterior aspect of the abdominal wall.  CT abdomen/pelvis 11/18/2014 with significant progression of metastatic disease with enlarging metastasis within the anterior abdominal wall, enlargement of multiple peritoneal implants, interval development of masses within the pancreatic head and caudate lobe of the liver. 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 New complex solid and cystic left adnexal lesion.  Placement of percutaneous internal/external  biliary drain 11/19/2014  Cycle 1 FOLFOX 12/09/2014  Cycle 2 FOLFOX 12/23/2014  CT 01/06/2015 with a decrease in the anterior abdominal wall tumor 2. Gastric outlet obstruction secondary to #1. Resolved. 3. Nausea/vomiting secondary to #2. Resolved. 4. Abdominal pain secondary to #1. 5. History of weight loss. 6. Microcytic anemia, likely iron deficiency. She received iron dextran 10/11/2013. 7. Lupus/Sjgren's. 8. Pneumonia during hospitalization July 2015.  9. Abdominal abscesses with body fluid culture 10/20/2013 showing moderate Candida tropicalis. She was discharged home on a 4 week course of fluconazole.  10. Mucositis and hand/foot syndrome following cycle 1-week #3 5-FU/leucovorin, the 5-FU and leucovorin were dose reduced. 11. 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 12. Status post LEEP 11/07/2013. 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 Pap smear 06/08/2014-negative for intraepithelial lesions or malignancy 13. Gram-negative sepsis 01/06/2015-blood cultures positive for Klebsiella/Escherichia coli, culture from biliary drainage positive for Enterobacter 14. New right hydronephrosis on CT 01/06/2015, stable on ultrasound 01/21/2015, mildly progressive right hydronephrosis secondary to any elongated obstructing mass on the CT 01/30/2015 status post placement of a right ureter stent 02/01/2015 15. Admission 01/20/2015 with fever/sepsis, one blood culture positive for Klebsiella pneumoniae, urine culture positive for a resistant Escherichia coli 16. Diarrhea-negative stool for C. difficile on 01/30/2015 and 02/09/2015, potentially related to antibiotic therapy   Disposition:  Her overall performance status is partially improved today. I recommended she decrease the oxycodone dose to 5 mg.  She will increase the use of Imodium and begin a probiotic. Andrea Best will return for an appointment on 02/16/2015. She will contact us in the interim for increased diarrhea or new symptoms.   Betsy Coder, MD  02/12/2015  10:49 AM

## 2015-02-12 NOTE — Telephone Encounter (Signed)
per pof to sch pt appt-gave pt copy of avs-cld & spoke to pt and gave pt appt time & dta e for 11/22

## 2015-02-16 ENCOUNTER — Other Ambulatory Visit: Payer: 59

## 2015-02-16 ENCOUNTER — Ambulatory Visit (HOSPITAL_BASED_OUTPATIENT_CLINIC_OR_DEPARTMENT_OTHER): Payer: 59

## 2015-02-16 ENCOUNTER — Ambulatory Visit (HOSPITAL_BASED_OUTPATIENT_CLINIC_OR_DEPARTMENT_OTHER): Payer: 59 | Admitting: Oncology

## 2015-02-16 ENCOUNTER — Ambulatory Visit: Payer: 59 | Admitting: Oncology

## 2015-02-16 ENCOUNTER — Other Ambulatory Visit (HOSPITAL_BASED_OUTPATIENT_CLINIC_OR_DEPARTMENT_OTHER): Payer: 59

## 2015-02-16 VITALS — BP 125/55 | HR 115 | Temp 98.6°F | Resp 18 | Ht 66.0 in | Wt 128.9 lb

## 2015-02-16 DIAGNOSIS — C169 Malignant neoplasm of stomach, unspecified: Secondary | ICD-10-CM

## 2015-02-16 DIAGNOSIS — R197 Diarrhea, unspecified: Secondary | ICD-10-CM | POA: Diagnosis not present

## 2015-02-16 LAB — CBC WITH DIFFERENTIAL/PLATELET
BASO%: 0.4 % (ref 0.0–2.0)
BASOS ABS: 0 10*3/uL (ref 0.0–0.1)
EOS ABS: 0.1 10*3/uL (ref 0.0–0.5)
EOS%: 0.6 % (ref 0.0–7.0)
HEMATOCRIT: 28 % — AB (ref 34.8–46.6)
HGB: 9.2 g/dL — ABNORMAL LOW (ref 11.6–15.9)
LYMPH#: 1.5 10*3/uL (ref 0.9–3.3)
LYMPH%: 14.7 % (ref 14.0–49.7)
MCH: 26.8 pg (ref 25.1–34.0)
MCHC: 32.8 g/dL (ref 31.5–36.0)
MCV: 81.7 fL (ref 79.5–101.0)
MONO#: 1.2 10*3/uL — AB (ref 0.1–0.9)
MONO%: 12.2 % (ref 0.0–14.0)
NEUT#: 7.2 10*3/uL — ABNORMAL HIGH (ref 1.5–6.5)
NEUT%: 72.1 % (ref 38.4–76.8)
PLATELETS: 351 10*3/uL (ref 145–400)
RBC: 3.43 10*6/uL — ABNORMAL LOW (ref 3.70–5.45)
RDW: 18 % — ABNORMAL HIGH (ref 11.2–14.5)
WBC: 10 10*3/uL (ref 3.9–10.3)

## 2015-02-16 LAB — BASIC METABOLIC PANEL (CC13)
ANION GAP: 7 meq/L (ref 3–11)
BUN: 7.6 mg/dL (ref 7.0–26.0)
CO2: 25 mEq/L (ref 22–29)
CREATININE: 0.9 mg/dL (ref 0.6–1.1)
Calcium: 8.5 mg/dL (ref 8.4–10.4)
Chloride: 103 mEq/L (ref 98–109)
EGFR: >90 mL/min/{1.73_m2} (ref >90)
GLUCOSE: 102 mg/dL (ref 70–140)
POTASSIUM: 3.8 meq/L (ref 3.5–5.1)
Sodium: 136 mEq/L (ref 136–145)

## 2015-02-16 MED ORDER — HEPARIN SOD (PORK) LOCK FLUSH 100 UNIT/ML IV SOLN
500.0000 [IU] | Freq: Once | INTRAVENOUS | Status: AC
  Administered 2015-02-16: 500 [IU] via INTRAVENOUS
  Filled 2015-02-16: qty 5

## 2015-02-16 MED ORDER — SODIUM CHLORIDE 0.9 % IJ SOLN
10.0000 mL | INTRAMUSCULAR | Status: DC | PRN
  Administered 2015-02-16: 10 mL via INTRAVENOUS
  Filled 2015-02-16: qty 10

## 2015-02-16 MED ORDER — DIPHENOXYLATE-ATROPINE 2.5-0.025 MG PO TABS: 1.0000 | ORAL_TABLET | Freq: Four times a day (QID) | ORAL | Status: DC | PRN

## 2015-02-16 MED ORDER — OXYCODONE HCL 5 MG/5ML PO SOLN: 10.0000 mg | ORAL | Status: DC | PRN

## 2015-02-16 NOTE — Patient Instructions (Signed)

## 2015-02-16 NOTE — Progress Notes (Signed)
[facility] OFFICE PROGRESS NOTE   Diagnosis: Gastric cancer   INTERVAL HISTORY:   She returns as scheduled. She continues to have diarrhea. She has not started a probiotic. She is taking Pepto-Bismol and Imodium. She complains of pain near the left shoulder. She is taking narcotics for this pain. She continues to have pain in the upper abdomen. She is drinking adequate fluids. She reports a few episodes of nausea and vomiting.  Objective:  Vital signs in last 24 hours:  Blood pressure 125/55, pulse 115, temperature 98.6 F (37 C), temperature source Oral, resp. rate 18, height 5' 6" (1.676 m), weight 128 lb 14.4 oz (58.469 kg), last menstrual period 11/27/2014, SpO2 100 %.    HEENT: No thrush Resp: Lungs clear bilaterally Cardio: Regular rate and rhythm GI: Tender in the medical abdomen, no mass, right upper abdomen/costal margin biliary drain site without evidence of infection Vascular: No leg edema Musculoskeletal: No pain with motion at the left shoulder, the area of pain is posterior to the left clavicle and deep to the trapezius, there is slight fullness at the left compared to the right trapezius     Portacath/PICC-without erythema  Lab Results:  Lab Results  Component Value Date   WBC 10.0 02/16/2015   HGB 9.2* 02/16/2015   HCT 28.0* 02/16/2015   MCV 81.7 02/16/2015   PLT 351 02/16/2015   NEUTROABS 7.2* 02/16/2015    Medications: I have reviewed the patient's current medications.  Assessment/Plan: 1. Gastric cancer status post upper endoscopy 09/22/2013 with findings of a partially obstructing oozing cratered gastric ulcer in the gastric antrum.  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   CT scans chest/abdomen/pelvis 10/03/2013 showed a 7 x 5 mm groundglass opacity in the superior segment of the right lower lobe; a 4.5 mm nodular density in the right upper lobe; bilateral axillary adenopathy; low density  area anteriorly in the left hepatic lobe most consistent with fatty infiltration; another hypodense area within the medial segment of the left hepatic lobe; severe gastric distention; mild bilateral inguinal adenopathy.   Subtotal gastrectomy and lymph node dissection with creation of a gastrojejunostomy 10/10/2013. No evidence of distant metastatic disease noted at the time of surgery. No evidence of liver metastases. 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 Tumor involved the serosal surface. Resection margins were negative. 10 of 17 lymph nodes were positive for metastatic adenocarcinoma. Her-2 not amplified   Cycle 1 weekly 5-FU/leucovorin 11/14/2013, dose reduced beginning with week 3 secondary to mucositis and hand/foot syndrome.   Cycle 2 weekly 5-FU/leucovorin beginning 12/19/2013.  Initiation of radiation/Xeloda 01/26/2014. She decided to discontinue further radiation/Xeloda after one fraction due to significant nausea/vomiting.  Cycle 3 weekly 5-FU/leucovorin beginning 02/13/2014  Cycle 4 weekly 5-FU/leucovorin beginning 03/18/2014  Cycle 5 weekly 5-FU/leucovorin beginning 04/28/2014  CTs 06/16/2014 with new enhancing nodular lesions along the ventral peritoneal surface  CT-guided biopsy of an anterior peritoneal lesion on 06/24/2014 confirmed poorly differentiated adenocarcinoma  CTs 09/03/2014 with an increasing confluent enhancing lesion located at the ventral peritoneal surface midline of the anterior abdomen. Lesion inseparable from the posterior aspect of the abdominal wall.  CT abdomen/pelvis 11/18/2014 with significant progression of metastatic disease with enlarging metastasis within the anterior abdominal wall, enlargement of multiple peritoneal implants, interval development of masses within the pancreatic head and caudate lobe of the liver. 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 New complex solid and cystic  left adnexal lesion.  Placement of percutaneous internal/external biliary drain 11/19/2014  Cycle 1 FOLFOX 12/09/2014  Cycle 2 FOLFOX 12/23/2014  CT 01/06/2015 with a decrease in the anterior abdominal wall tumor 2. Gastric outlet obstruction secondary to #1. Resolved. 3. Nausea/vomiting secondary to #2. Resolved. 4. Abdominal pain secondary to #1. 5. History of weight loss. 6. Microcytic anemia, likely iron deficiency. She received iron dextran 10/11/2013. 7. Lupus/Sjgren's. 8. Pneumonia during hospitalization July 2015.  9. Abdominal abscesses with body fluid culture 10/20/2013 showing moderate Candida tropicalis. She was discharged home on a 4 week course of fluconazole.  10. Mucositis and hand/foot syndrome following cycle 1-week #3 5-FU/leucovorin, the 5-FU and leucovorin were dose reduced. 11. 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 12. Status post LEEP 11/07/2013. 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 Pap smear 06/08/2014-negative for intraepithelial lesions or malignancy 13. Gram-negative sepsis 01/06/2015-blood cultures positive for Klebsiella/Escherichia coli, culture from biliary drainage positive for Enterobacter 14. New right hydronephrosis on CT 01/06/2015, stable on ultrasound 01/21/2015, mildly progressive right hydronephrosis secondary to any elongated obstructing mass on the CT 01/30/2015 status post placement of a right ureter stent 02/01/2015 15. Admission 01/20/2015 with fever/sepsis, one blood culture positive for Klebsiella pneumoniae, urine culture positive for a resistant Escherichia coli 16. Diarrhea-negative stool for C. difficile on 01/30/2015 and 02/09/2015, potentially related to antibiotic therapy, she will begin a probiotic and Lomotil 17. Pain at the left low neck/shoulder-she will be referred for a CT  02/19/2015   Disposition:  Her overall status appears unchanged. She has persistent diarrhea, potentially related to Augmentin therapy. She will begin a probiotic and Lomotil. Ms. Huntoon will return for an office visit and scheduled FOLFOX chemotherapy 02/24/2015. She is scheduled to be seen in interventional radiology next week to evaluate the biliary drain.  She will contact us in the interim as needed.  Betsy Coder, MD  02/16/2015  11:28 AM

## 2015-02-19 ENCOUNTER — Telehealth: Payer: Self-pay | Admitting: Genetic Counselor

## 2015-02-19 ENCOUNTER — Other Ambulatory Visit: Payer: Self-pay | Admitting: Radiology

## 2015-02-19 NOTE — Telephone Encounter (Signed)
Due to LT out moved 11/30 f/u to CB. Spoke with patient re change and confirmed time for 11/30 @ 12:15 pm. Potential conflict with genetics/CB. Left voice mail for Santiago Glad asking that she take a look at appointments to see if she can work with the time frame and to left me know if not.

## 2015-02-21 ENCOUNTER — Other Ambulatory Visit: Payer: Self-pay | Admitting: Oncology

## 2015-02-22 ENCOUNTER — Ambulatory Visit (HOSPITAL_COMMUNITY)
Admission: RE | Admit: 2015-02-22 | Discharge: 2015-02-22 | Disposition: A | Discharge status: Home / Self Care | Payer: 59 | Source: Ambulatory Visit | Attending: Oncology | Admitting: Oncology

## 2015-02-22 ENCOUNTER — Other Ambulatory Visit: Payer: 59

## 2015-02-22 ENCOUNTER — Encounter (HOSPITAL_COMMUNITY): Payer: Self-pay

## 2015-02-22 ENCOUNTER — Other Ambulatory Visit: Payer: Self-pay | Admitting: Radiology

## 2015-02-22 ENCOUNTER — Ambulatory Visit (HOSPITAL_COMMUNITY): Admission: RE | Admit: 2015-02-22 | Payer: 59 | Source: Ambulatory Visit

## 2015-02-22 ENCOUNTER — Ambulatory Visit: Payer: 59 | Admitting: Oncology

## 2015-02-22 ENCOUNTER — Other Ambulatory Visit (HOSPITAL_COMMUNITY): Payer: 59

## 2015-02-22 DIAGNOSIS — Z85028 Personal history of other malignant neoplasm of stomach: Secondary | ICD-10-CM | POA: Diagnosis present

## 2015-02-22 DIAGNOSIS — M542 Cervicalgia: Secondary | ICD-10-CM | POA: Diagnosis not present

## 2015-02-22 DIAGNOSIS — Z08 Encounter for follow-up examination after completed treatment for malignant neoplasm: Secondary | ICD-10-CM | POA: Insufficient documentation

## 2015-02-22 DIAGNOSIS — C169 Malignant neoplasm of stomach, unspecified: Secondary | ICD-10-CM

## 2015-02-22 MED ORDER — IOHEXOL 300 MG/ML  SOLN
75.0000 mL | Freq: Once | INTRAMUSCULAR | Status: AC | PRN
  Administered 2015-02-22: 75 mL via INTRAVENOUS

## 2015-02-23 ENCOUNTER — Ambulatory Visit (HOSPITAL_COMMUNITY)
Admission: RE | Admit: 2015-02-23 | Discharge: 2015-02-23 | Disposition: A | Discharge status: Home / Self Care | Payer: 59 | Source: Ambulatory Visit | Attending: Radiology | Admitting: Radiology

## 2015-02-23 ENCOUNTER — Other Ambulatory Visit: Payer: Self-pay | Admitting: Radiology

## 2015-02-23 DIAGNOSIS — Z4682 Encounter for fitting and adjustment of non-vascular catheter: Secondary | ICD-10-CM | POA: Insufficient documentation

## 2015-02-23 DIAGNOSIS — K831 Obstruction of bile duct: Secondary | ICD-10-CM | POA: Insufficient documentation

## 2015-02-23 DIAGNOSIS — C169 Malignant neoplasm of stomach, unspecified: Secondary | ICD-10-CM | POA: Diagnosis not present

## 2015-02-23 DIAGNOSIS — R17 Unspecified jaundice: Secondary | ICD-10-CM | POA: Diagnosis not present

## 2015-02-23 LAB — CBC WITH DIFFERENTIAL/PLATELET
BASOS ABS: 0 10*3/uL (ref 0.0–0.1)
BASOS PCT: 0 %
EOS ABS: 0.1 10*3/uL (ref 0.0–0.7)
EOS PCT: 1 %
HEMATOCRIT: 25.1 % — AB (ref 36.0–46.0)
Hemoglobin: 8.2 g/dL — ABNORMAL LOW (ref 12.0–15.0)
LYMPHS ABS: 1.2 10*3/uL (ref 0.7–4.0)
LYMPHS PCT: 12 %
MCH: 27.2 pg (ref 26.0–34.0)
MCHC: 32.7 g/dL (ref 30.0–36.0)
MCV: 83.1 fL (ref 78.0–100.0)
MONOS PCT: 11 %
Monocytes Absolute: 1.1 10*3/uL — ABNORMAL HIGH (ref 0.1–1.0)
NEUTROS PCT: 76 %
Neutro Abs: 7.7 10*3/uL (ref 1.7–7.7)
PLATELETS: 216 10*3/uL (ref 150–400)
RBC: 3.02 MIL/uL — AB (ref 3.87–5.11)
RDW: 17.4 % — AB (ref 11.5–15.5)
WBC: 10.1 10*3/uL (ref 4.0–10.5)

## 2015-02-23 LAB — COMPREHENSIVE METABOLIC PANEL
ALBUMIN: 1.9 g/dL — AB (ref 3.5–5.0)
ALT: 20 U/L (ref 14–54)
AST: 36 U/L (ref 15–41)
Alkaline Phosphatase: 106 U/L (ref 38–126)
Anion gap: 6 (ref 5–15)
BUN: 11 mg/dL (ref 6–20)
CHLORIDE: 101 mmol/L (ref 101–111)
CO2: 25 mmol/L (ref 22–32)
Calcium: 8 mg/dL — ABNORMAL LOW (ref 8.9–10.3)
Creatinine, Ser: 0.96 mg/dL (ref 0.44–1.00)
GFR calc Af Amer: >60 mL/min (ref >60)
GFR calc non Af Amer: >60 mL/min (ref >60)
GLUCOSE: 102 mg/dL — AB (ref 65–99)
POTASSIUM: 3.5 mmol/L (ref 3.5–5.1)
Sodium: 132 mmol/L — ABNORMAL LOW (ref 135–145)
Total Bilirubin: 0.6 mg/dL (ref 0.3–1.2)
Total Protein: 7 g/dL (ref 6.5–8.1)

## 2015-02-23 LAB — PROTIME-INR
INR: 1.54 — ABNORMAL HIGH (ref 0.00–1.49)
Prothrombin Time: 18.5 seconds — ABNORMAL HIGH (ref 11.6–15.2)

## 2015-02-23 LAB — APTT: aPTT: 41 seconds — ABNORMAL HIGH (ref 24–37)

## 2015-02-23 MED ORDER — CIPROFLOXACIN IN D5W 400 MG/200ML IV SOLN
INTRAVENOUS | Status: AC
  Filled 2015-02-23: qty 200

## 2015-02-23 MED ORDER — FENTANYL CITRATE (PF) 100 MCG/2ML IJ SOLN
INTRAMUSCULAR | Status: AC
  Filled 2015-02-23: qty 4

## 2015-02-23 MED ORDER — FENTANYL CITRATE (PF) 100 MCG/2ML IJ SOLN
INTRAMUSCULAR | Status: AC | PRN
  Administered 2015-02-23: 25 ug via INTRAVENOUS
  Administered 2015-02-23: 50 ug via INTRAVENOUS
  Administered 2015-02-23: 25 ug via INTRAVENOUS

## 2015-02-23 MED ORDER — MIDAZOLAM HCL 2 MG/2ML IJ SOLN
INTRAMUSCULAR | Status: AC | PRN
  Administered 2015-02-23 (×5): 1 mg via INTRAVENOUS

## 2015-02-23 MED ORDER — HEPARIN SOD (PORK) LOCK FLUSH 100 UNIT/ML IV SOLN
500.0000 [IU] | INTRAVENOUS | Status: AC | PRN
  Administered 2015-02-23: 500 [IU]
  Filled 2015-02-23: qty 5

## 2015-02-23 MED ORDER — MIDAZOLAM HCL 2 MG/2ML IJ SOLN
INTRAMUSCULAR | Status: AC
  Filled 2015-02-23: qty 6

## 2015-02-23 MED ORDER — LIDOCAINE HCL 1 % IJ SOLN
INTRAMUSCULAR | Status: AC
  Filled 2015-02-23: qty 20

## 2015-02-23 MED ORDER — SODIUM CHLORIDE 0.9 % IV SOLN
INTRAVENOUS | Status: DC
  Administered 2015-02-23: 11:00:00 via INTRAVENOUS

## 2015-02-23 MED ORDER — IOHEXOL 300 MG/ML  SOLN
25.0000 mL | Freq: Once | INTRAMUSCULAR | Status: AC | PRN
  Administered 2015-02-23: 25 mL

## 2015-02-23 MED ORDER — CIPROFLOXACIN IN D5W 400 MG/200ML IV SOLN
400.0000 mg | INTRAVENOUS | Status: AC
  Administered 2015-02-23: 400 mg via INTRAVENOUS

## 2015-02-23 NOTE — H&P (Signed)
Referring Physician(s): Sherrill,B  Chief Complaint: Gastric cancer, biliary obstruction   Subjective:  Patient familiar to IR service from prior placement of an internal/external biliary drain secondary to obstruction from recurrent gastric cancer on 11/19/14. The drain was last exchanged on 12/24/14. She presents today for routine follow-up cholangiogram with drain exchange and possible internal stenting. She currently denies fever, headache, chest pain, dyspnea, cough or abnormal bleeding. She has had intermittent abdominal pain, nausea /vomiting, diarrhea.   Allergies: Compazine; Azithromycin; Sulfa antibiotics; Hydrocodone-acetaminophen; and Other  Medications: Prior to Admission medications   Medication Sig Start Date End Date Taking? Authorizing Provider  amoxicillin-clavulanate (AUGMENTIN) 875-125 MG tablet Take 1 tablet by mouth 2 (two) times daily. For 30days 02/05/15  Yes Verlee Monte, MD  Artificial Tear Ointment (DRY EYES OP) Apply 2 drops to eye daily as needed (dry eyes).   Yes Historical Provider, MD  diphenoxylate-atropine (LOMOTIL) 2.5-0.025 MG tablet Take 1-2 tablets by mouth 4 (four) times daily as needed for diarrhea or loose stools. Up to 8 per day 02/16/15  Yes Ladell Pier, MD  FERROUS SULFATE PO Take 1 tablet by mouth daily.   Yes Historical Provider, MD  hydroxychloroquine (PLAQUENIL) 200 MG tablet Take 1 tablet (200 mg total) by mouth 2 (two) times daily. 02/12/15  Yes Ladell Pier, MD  ondansetron (ZOFRAN) 4 MG tablet Take 1 tablet (4 mg total) by mouth every 8 (eight) hours as needed for nausea or vomiting. 12/02/14  Yes Owens Shark, NP  oxyCODONE (ROXICODONE) 5 MG/5ML solution Take 10-20 mLs (10-20 mg total) by mouth every 4 (four) hours as needed for moderate pain or severe pain. 02/16/15  Yes Ladell Pier, MD  potassium chloride SA (K-DUR,KLOR-CON) 20 MEQ tablet Take 1 tab twice a day for 3 days then take 1 tab daily 02/09/15  Yes Owens Shark,  NP  bismuth subsalicylate (PEPTO BISMOL) 262 MG/15ML suspension Take 30 mLs by mouth every 6 (six) hours as needed for diarrhea or loose stools.    Historical Provider, MD  dexamethasone (DECADRON) 4 MG tablet Take 2 tablets (8 mg total) by mouth 2 (two) times daily. Take 8 mg twice a day for two days beginning the day after chemotherapy. 12/23/14   Owens Shark, NP  DiphenhydrAMINE HCl (ZZZQUIL) 50 MG/30ML LIQD Take 30 mLs by mouth at bedtime as needed (for sleep).    Historical Provider, MD  feeding supplement (BOOST / RESOURCE BREEZE) LIQD Take 1 Container by mouth 2 (two) times daily between meals. Patient taking differently: Take 1 Container by mouth daily.  01/11/15   Hosie Poisson, MD  LORazepam (ATIVAN) 0.5 MG tablet Take 1 tablet (0.5 mg total) by mouth every 8 (eight) hours as needed for sleep (nausea). 01/20/15   Ladell Pier, MD  morphine (MS CONTIN) 15 MG 12 hr tablet Take 1 tablet (15 mg total) by mouth every 12 (twelve) hours. 01/30/15   Domenic Polite, MD  [facility]    Historical Provider, MD  saccharomyces boulardii (FLORASTOR) 250 MG capsule Take 2 capsules (500 mg total) by mouth 2 (two) times daily. 02/12/15   Ladell Pier, MD     Vital Signs: BP 107/71 mmHg  Pulse 100  Temp(Src) 99 F (37.2 C) (Oral)  Resp 18  SpO2 100%  LMP 10/27/2014 (Approximate)  Physical Exam  Constitutional: She is oriented to person, place, and time.  Thin black female in no acute distress  Cardiovascular: Normal rate  and regular rhythm.   Pulmonary/Chest: Effort normal.  Abdominal: Soft. Bowel sounds are normal. There is tenderness.  Internal/external biliary drain intact, insertion site okay, mildly tender, dark bile in drain bag  Musculoskeletal: Normal range of motion. She exhibits no edema.  Neurological: She is alert and oriented to person, place, and time.    Imaging: Ct Soft Tissue Neck W Contrast  02/22/2015  CLINICAL DATA:  Gastric cancer with  left lower neck pain. EXAM: CT NECK WITH CONTRAST TECHNIQUE: Multidetector CT imaging of the neck was performed using the standard protocol following the bolus administration of intravenous contrast. CONTRAST:  51mL OMNIPAQUE IOHEXOL 300 MG/ML  SOLN COMPARISON:  PET-CT 11/11/2013 FINDINGS: Pharynx and larynx: Motion at the level of the larynx and hypopharynx, but no suspicious enhancement or asymmetry. There is a chronic roughly 12 mm lateral outpouching containing debris and gas, left lateral near the esophageal verge. Communication with the pharynx/esophagus not characterized by CT. Salivary glands: Atrophic appearance, especially of the parotid and right submandibular glands. No evidence of mass lesion or inflammation. Thyroid: Bilateral sub cm nodules. Lymph nodes: No enlarged or necrotic appearing lymph nodes, with close attention to the left supraclavicular fossa. Vascular: Right IJ porta catheter, visualized portions unremarkable. Limited intracranial: Negative Visualized orbits: Sunken globes.  No evidence of mass lesion. Mastoids and visualized paranasal sinuses: Clear Skeleton: No acute finding or aggressive process in the visualized skeleton. Upper chest: Reported separately IMPRESSION: 1. No evidence of metastatic disease to the neck. No explanation for left neck pain. 2. 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 Electronically Signed   By: Monte Fantasia M.D.   On: 02/22/2015 17:02   Ct Chest W Contrast  02/22/2015  CLINICAL DATA:  Gastric cancer diagnosed in June 2015, status post completion of chemotherapy in February 2016, with history of partial gastrectomy, presenting for chest restaging. EXAM: CT CHEST WITH CONTRAST TECHNIQUE: Multidetector CT imaging of the chest was performed during intravenous contrast administration. CONTRAST:  104mL OMNIPAQUE IOHEXOL 300 MG/ML  SOLN COMPARISON:  06/16/2014 chest CT.  01/27/2015 CT abdomen/pelvis. FINDINGS: Mediastinum/Nodes: Normal heart  size. No pericardial fluid/thickening. Right internal jugular MediPort terminates at the cavoatrial junction. Great vessels are normal in course and caliber. No central pulmonary emboli. Stable hypodense 0.5 cm right thyroid lobe nodule. Stable hypodense 0.3 cm left thyroid lobe nodule. There is fluid throughout the thoracic esophageal lumen, with no definite esophageal wall thickening. No pathologically enlarged axillary, mediastinal or hilar lymph nodes. There is near complete resolution of the previously described thymic hyperplasia. Lungs/Pleura: No pneumothorax. No pleural effusion. There is mild platelike scarring versus atelectasis in the anterior basilar right lower lobe in lingula, decreased since 01/27/2015. There is a new 4 mm solid right lower lobe pulmonary nodule (series 5/ image 27 and best seen on the coronal sequence series 602/ image 92). No acute consolidative airspace disease, additional significant pulmonary nodules or lung masses. Upper abdomen: The partially visualized percutaneous internal external biliary drain traversing the right liver lobe appears stable in position with the partially visualized distal pigtail portion in the duodenal lumen. There is stable mild-to-moderate diffuse intrahepatic biliary ductal dilatation. Stable postsurgical changes from partial distal gastrectomy with gastrojejunostomy. Stable main pancreatic duct dilation and pancreatic atrophy in the pancreatic body and tail, with stable heterogeneous enhancement and fullness of the pancreatic head. Small amount of perihepatic fluid surrounding the caudate lobe, slightly increased. Musculoskeletal:  No aggressive appearing focal osseous lesions. IMPRESSION: 1. New solitary 4 mm right lower lobe pulmonary  nodule, indeterminate, pulmonary metastasis not excluded. Recommend attention to this nodule on follow-up chest CT in 3 months. 2. Mild bibasilar scarring versus atelectasis, decreased. No acute consolidative airspace  disease. 3. No thoracic adenopathy. 4. Prominent fluid throughout the thoracic esophageal lumen, suggesting esophageal dysmotility and/or gastroesophageal reflux. 5. Stable intrahepatic biliary ductal dilatation, right percutaneous biliary drain position and pancreatic head heterogeneity and fullness. Small amount of perihepatic fluid surrounding the caudate lobe, slightly increased. Electronically Signed   By: Ilona Sorrel M.D.   On: 02/22/2015 17:08    Labs:  CBC:  Recent Labs  02/03/15 0444 02/09/15 0941 02/12/15 1017 02/16/15 1049  WBC 8.4 11.5* 8.6 10.0  HGB 7.7* 8.2* 9.1* 9.2*  HCT 23.8* 25.4* 27.7* 28.0*  PLT 175 280 327 351    COAGS:  Recent Labs  06/24/14 1142 11/19/14 0714 12/24/14 0905 01/06/15 1450  INR 1.04 1.15 1.18 1.56*  APTT 30  --   --  36    BMP:  Recent Labs  02/01/15 0505 02/02/15 1020 02/03/15 0444 02/04/15 0340 02/09/15 0941 02/12/15 1017 02/16/15 1049  NA 136 135 135 136 133* 136 136  K 3.4* 3.2* 3.2* 3.7 3.2* 3.7 3.8  CL 102 101 102 103  --   --   --   CO2 28 27 28 26  19* 25 25  GLUCOSE 90 116* 108* 96 110 94 102  BUN 11 11 14 14  6.9* 5.5* 7.6  CALCIUM 8.0* 7.9* 7.9* 7.5* 8.2* 8.4 8.5  CREATININE 1.14* 1.00 1.03* 1.28* 1.1 1.1 0.9  GFRNONAA 58* >60 >60 51*  --   --   --   GFRAA >60 >60 >60 59*  --   --   --     LIVER FUNCTION TESTS:  Recent Labs  01/28/15 0350 02/05/15 0530 02/09/15 0941 02/12/15 1017  BILITOT 0.8 0.8 0.45 0.73  AST 20 22 22 30   ALT 10* 13* 13 17  ALKPHOS 67 84 107 126  PROT 6.5 7.2 7.6 7.5  ALBUMIN 2.2* 2.1* 2.1* 2.0*    Assessment and Plan: Patient with history of recurrent gastric cancer and biliary obstruction, status post internal/external biliary drain placement on 11/19/14, last exchanged on 12/24/14. Follow-up labs pending. Patient presents today for routine follow-up cholangiogram and drain exchange with possible stenting. Details/risks of procedure, including but not limited to, internal bleeding,  infection/sepsis, inability to place stent discussed with patient and mother with their understanding and consent.   Signed: D. Rowe Robert 02/23/2015, 10:41 AM   I spent a total of 15 minutes at the the patient's bedside AND on the patient's hospital floor or unit, greater than 50% of which was counseling/coordinating care for cholangiogram with biliary drain exchange, possible stenting

## 2015-02-23 NOTE — Procedures (Signed)
Interventional Radiology Procedure Note  Procedure: Placement of a 10 x 60 mm WallFlex biliary stent across the malignant obstruction.     Complications:  None.  Estimated Blood Loss: 0 mL  Recommendations:  - Drain to bag x 24 hrs - Cap drain tomorrow afternoon - Return to IR in 2 weeks for drain check and possible removal.   Signed,  Criselda Peaches, MD

## 2015-02-23 NOTE — Progress Notes (Signed)
Tube care and how to clamp instructions given to mother by Elta Guadeloupe B. Of IR.

## 2015-02-23 NOTE — Discharge Instructions (Signed)
Biliary Drainage Catheter Placement, Care After Refer to this sheet in the next few weeks. These instructions provide you with information on caring for yourself after your procedure. Your health care provider may also give you more specific instructions. Your treatment has been planned according to current medical practices, but problems sometimes occur. Call your health care provider if you have any problems or questions after your procedure. WHAT TO EXPECT AFTER THE PROCEDURE After your procedure, it is typical to have the following:  Pain or soreness at the catheter insertion site.  Drowsiness for several hours after the procedure.  Some bruising at the catheter insertion site.  Drainage into the collection bag on the outside of your body, if you have an external drainage catheter. You might see bloody discharge in the bag for the first day or two. This should turn a yellow-green color soon afterward. HOME CARE INSTRUCTIONS   Do not use machinery, drive, or make legal decisions for 24 hours after your procedure.  Have someone drive you home.  Resume your usual diet. Avoid alcoholic beverages for 24 hours after your procedure.  Rest for the remainder of the day.  Only take over-the-counter or prescription medicines for pain, discomfort, or fever as directed by your health care provider. Do not take aspirin or blood thinners unless directed otherwise. This can make bleeding worse.  Clean the tube insertion site as directed by your health care provider.  Take showers, not baths. Avoid pools and hot tubs. Before showering, cover the area with plastic wrap and tape the edges of the plastic wrap to your skin. This is done to keep your skin dry.  Keep the skin around the insertion site dry. If the area gets wet, dry the skin completely.  Keep all follow-up appointments. SEEK MEDICAL CARE IF:   Your pain gets worse and is not relieved with pain medicines after an initial  improvement.  You have any questions about your tube.  Your skin breaks down around the tube.  You have a fever.  You have chills. SEEK IMMEDIATE MEDICAL CARE IF:   Your redness, soreness, or swelling at the tube insertion site gets worse despite good cleaning.  You have leakage of bile around the tube.  Your tube becomes blocked or clogged.  Your catheter is dislodged or comes out.   This information is not intended to replace advice given to you by your health care provider. Make sure you discuss any questions you have with your health care provider.   Document Released: 10/26/2003 Document Revised: 03/18/2013 Document Reviewed: 11/18/2012 Elsevier Interactive Patient Education 2016 [place] 24 HOURS, REMOVE DRAINAGE BAG AND APPLY CAP.  DISCARD DRAINAGE BAG. THIS WILL BE 02/24/2015 AT 2PM  Moderate Conscious Sedation, Adult Sedation is the use of medicines to promote relaxation and relieve discomfort and anxiety. Moderate conscious sedation is a type of sedation. Under moderate conscious sedation you are less alert than normal but are still able to respond to instructions or stimulation. Moderate conscious sedation is used during short medical and dental procedures. It is milder than deep sedation or general anesthesia and allows you to return to your regular activities sooner. LET [facility] CARE PROVIDER KNOW ABOUT:   Any allergies you have.  All medicines you are taking, including vitamins, herbs, eye drops, creams, and over-the-counter medicines.  Use of steroids (by mouth or creams).  Previous problems you or members of your family have had with the use of anesthetics.  Any blood disorders  you have.  Previous surgeries you have had.  Medical conditions you have.  Possibility of pregnancy, if this applies.  Use of cigarettes, alcohol, or illegal drugs. RISKS AND COMPLICATIONS Generally, this is a safe procedure. However, as with any procedure,  problems can occur. Possible problems include:  Oversedation.  Trouble breathing on your own. You may need to have a breathing tube until you are awake and breathing on your own.  Allergic reaction to any of the medicines used for the procedure. BEFORE THE PROCEDURE  You may have blood tests done. These tests can help show how well your kidneys and liver are working. They can also show how well your blood clots.  A physical exam will be done.  Only take medicines as directed by your health care provider. You may need to stop taking medicines (such as blood thinners, aspirin, or nonsteroidal anti-inflammatory drugs) before the procedure.   Do not eat or drink at least 6 hours before the procedure or as directed by your health care provider.  Arrange for a responsible adult, family member, or friend to take you home after the procedure. He or she should stay with you for at least 24 hours after the procedure, until the medicine has worn off. PROCEDURE   An intravenous (IV) catheter will be inserted into one of your veins. Medicine will be able to flow directly into your body through this catheter. You may be given medicine through this tube to help prevent pain and help you relax.  The medical or dental procedure will be done. AFTER THE PROCEDURE  You will stay in a recovery area until the medicine has worn off. Your blood pressure and pulse will be checked.   Depending on the procedure you had, you may be allowed to go home when you can tolerate liquids and your pain is under control.   This information is not intended to replace advice given to you by your health care provider. Make sure you discuss any questions you have with your health care provider.   Document Released: 12/06/2000 Document Revised: 04/03/2014 Document Reviewed: 11/18/2012 Elsevier Interactive Patient Education [employer].

## 2015-02-24 ENCOUNTER — Telehealth: Payer: Self-pay | Admitting: *Deleted

## 2015-02-24 ENCOUNTER — Other Ambulatory Visit: Payer: 59

## 2015-02-24 ENCOUNTER — Encounter: Payer: 59 | Admitting: Nutrition

## 2015-02-24 ENCOUNTER — Encounter: Payer: 59 | Admitting: Genetic Counselor

## 2015-02-24 ENCOUNTER — Other Ambulatory Visit (HOSPITAL_COMMUNITY): Payer: Self-pay | Admitting: Interventional Radiology

## 2015-02-24 ENCOUNTER — Ambulatory Visit (HOSPITAL_BASED_OUTPATIENT_CLINIC_OR_DEPARTMENT_OTHER): Payer: 59 | Admitting: Nurse Practitioner

## 2015-02-24 ENCOUNTER — Ambulatory Visit: Payer: 59

## 2015-02-24 DIAGNOSIS — C801 Malignant (primary) neoplasm, unspecified: Secondary | ICD-10-CM

## 2015-02-24 DIAGNOSIS — K831 Obstruction of bile duct: Principal | ICD-10-CM

## 2015-02-24 DIAGNOSIS — C162 Malignant neoplasm of body of stomach: Secondary | ICD-10-CM

## 2015-02-24 NOTE — Telephone Encounter (Signed)
Per Dr. Benay Spice; left message for pt to call re: CT results (no abnormality in neck/upper chest to explain left sided pain)

## 2015-02-24 NOTE — Telephone Encounter (Signed)
-----   Message from Andrea Pier, MD sent at 02/22/2015  6:58 PM EST ----- Please call patient, no abnormality in neck/upper chest to explain left sided pain

## 2015-02-24 NOTE — Telephone Encounter (Signed)
Per desk RN I have moved appts from today to tomorrow. The desk RN will call the patient.

## 2015-02-25 ENCOUNTER — Telehealth: Payer: Self-pay | Admitting: Oncology

## 2015-02-25 ENCOUNTER — Ambulatory Visit: Payer: 59

## 2015-02-25 ENCOUNTER — Ambulatory Visit (HOSPITAL_BASED_OUTPATIENT_CLINIC_OR_DEPARTMENT_OTHER): Payer: 59

## 2015-02-25 ENCOUNTER — Ambulatory Visit: Payer: 59 | Admitting: Nutrition

## 2015-02-25 ENCOUNTER — Other Ambulatory Visit (HOSPITAL_BASED_OUTPATIENT_CLINIC_OR_DEPARTMENT_OTHER): Payer: 59

## 2015-02-25 ENCOUNTER — Encounter: Payer: Self-pay | Admitting: Genetic Counselor

## 2015-02-25 ENCOUNTER — Ambulatory Visit: Payer: 59 | Admitting: Genetic Counselor

## 2015-02-25 ENCOUNTER — Ambulatory Visit (HOSPITAL_BASED_OUTPATIENT_CLINIC_OR_DEPARTMENT_OTHER): Payer: 59 | Admitting: Oncology

## 2015-02-25 ENCOUNTER — Other Ambulatory Visit: Payer: Self-pay | Admitting: *Deleted

## 2015-02-25 VITALS — BP 115/63 | HR 108 | Temp 97.9°F | Resp 14

## 2015-02-25 VITALS — BP 115/63 | HR 108 | Temp 97.9°F | Resp 14 | Ht 66.0 in | Wt 130.7 lb

## 2015-02-25 DIAGNOSIS — C169 Malignant neoplasm of stomach, unspecified: Secondary | ICD-10-CM

## 2015-02-25 DIAGNOSIS — Z800 Family history of malignant neoplasm of digestive organs: Secondary | ICD-10-CM

## 2015-02-25 DIAGNOSIS — Z1379 Encounter for other screening for genetic and chromosomal anomalies: Secondary | ICD-10-CM | POA: Insufficient documentation

## 2015-02-25 DIAGNOSIS — G893 Neoplasm related pain (acute) (chronic): Secondary | ICD-10-CM

## 2015-02-25 DIAGNOSIS — Z95828 Presence of other vascular implants and grafts: Secondary | ICD-10-CM

## 2015-02-25 LAB — CBC WITH DIFFERENTIAL/PLATELET
BASO%: 0.2 % (ref 0.0–2.0)
Basophils Absolute: 0 10*3/uL (ref 0.0–0.1)
EOS ABS: 0 10*3/uL (ref 0.0–0.5)
EOS%: 0.1 % (ref 0.0–7.0)
HEMATOCRIT: 28.1 % — AB (ref 34.8–46.6)
HGB: 9.3 g/dL — ABNORMAL LOW (ref 11.6–15.9)
LYMPH#: 1.1 10*3/uL (ref 0.9–3.3)
LYMPH%: 9.4 % — AB (ref 14.0–49.7)
MCH: 26.9 pg (ref 25.1–34.0)
MCHC: 33 g/dL (ref 31.5–36.0)
MCV: 81.5 fL (ref 79.5–101.0)
MONO#: 1.3 10*3/uL — AB (ref 0.1–0.9)
MONO%: 11.5 % (ref 0.0–14.0)
NEUT%: 78.8 % — AB (ref 38.4–76.8)
NEUTROS ABS: 8.9 10*3/uL — AB (ref 1.5–6.5)
Platelets: 253 10*3/uL (ref 145–400)
RBC: 3.45 10*6/uL — AB (ref 3.70–5.45)
RDW: 17.6 % — ABNORMAL HIGH (ref 11.2–14.5)
WBC: 11.3 10*3/uL — ABNORMAL HIGH (ref 3.9–10.3)

## 2015-02-25 LAB — COMPREHENSIVE METABOLIC PANEL (CC13)
ALT: 20 U/L (ref 0–55)
AST: 38 U/L — AB (ref 5–34)
Albumin: 1.9 g/dL — ABNORMAL LOW (ref 3.5–5.0)
Alkaline Phosphatase: 146 U/L (ref 40–150)
Anion Gap: 7 mEq/L (ref 3–11)
BUN: 8.4 mg/dL (ref 7.0–26.0)
CHLORIDE: 100 meq/L (ref 98–109)
CO2: 23 meq/L (ref 22–29)
CREATININE: 0.9 mg/dL (ref 0.6–1.1)
Calcium: 8 mg/dL — ABNORMAL LOW (ref 8.4–10.4)
EGFR: 87 mL/min/{1.73_m2} — ABNORMAL LOW (ref >90)
GLUCOSE: 95 mg/dL (ref 70–140)
Potassium: 3.5 mEq/L (ref 3.5–5.1)
Sodium: 131 mEq/L — ABNORMAL LOW (ref 136–145)
TOTAL PROTEIN: 7.7 g/dL (ref 6.4–8.3)
Total Bilirubin: 0.86 mg/dL (ref 0.20–1.20)

## 2015-02-25 MED ORDER — MORPHINE SULFATE 4 MG/ML IJ SOLN
4.0000 mg | Freq: Once | INTRAMUSCULAR | Status: AC
Start: 2015-02-25 — End: 2015-02-25
  Administered 2015-02-25: 4 mg via INTRAVENOUS
  Filled 2015-02-25: qty 1

## 2015-02-25 MED ORDER — MORPHINE SULFATE ER 30 MG PO TBCR: 30.0000 mg | EXTENDED_RELEASE_TABLET | Freq: Two times a day (BID) | ORAL | Status: DC

## 2015-02-25 MED ORDER — MORPHINE SULFATE (PF) 4 MG/ML IV SOLN
INTRAVENOUS | Status: AC
  Filled 2015-02-25: qty 1

## 2015-02-25 MED ORDER — SODIUM CHLORIDE 0.9 % IJ SOLN
10.0000 mL | INTRAMUSCULAR | Status: DC | PRN
  Administered 2015-02-25: 10 mL via INTRAVENOUS
  Filled 2015-02-25: qty 10

## 2015-02-25 MED ORDER — HEPARIN SOD (PORK) LOCK FLUSH 100 UNIT/ML IV SOLN
500.0000 [IU] | Freq: Once | INTRAVENOUS | Status: AC
  Administered 2015-02-25: 500 [IU] via INTRAVENOUS
  Filled 2015-02-25: qty 5

## 2015-02-25 MED ORDER — MORPHINE SULFATE (CONCENTRATE) 20 MG/ML PO SOLN: ORAL | Status: DC

## 2015-02-25 NOTE — Telephone Encounter (Signed)
per pof to sch pt appt-sent MW email to sch pt trmt-pt to get updated copy on 12/3 @ d/c appt

## 2015-02-25 NOTE — Progress Notes (Signed)
[facility] OFFICE PROGRESS NOTE   Diagnosis: Gastric cancer  INTERVAL HISTORY:   Andrea Best returns after missing a scheduled visit earlier this week. She underwent placement of a biliary stent on 02/23/2015. The external drain is now. She reports soreness at the procedure site. She has increased pain in the right back. The pain is not relieved with oxycodone. She reports tolerating a diet. She is having bowel movements.  Objective:  Vital signs in last 24 hours:  Blood pressure 115/63, pulse 108, temperature 97.9 F (36.6 C), temperature source Oral, resp. rate 14, height _0  (1.676 m), weight 130 lb 11.2 oz (59.285 kg), last menstrual period 10/27/2014, SpO2 100 %.   Resp: Lungs clear bilaterally Cardio: Regular rate and rhythm GI: Mild tenderness at the biliary drain site, no evidence of infection. No hepatomegaly. The abdomen is soft. Vascular: No leg edema Musculoskeletal: Mild tenderness at the right flank, no mass    Portacath/PICC-without erythema  Lab Results:  Lab Results  Component Value Date   WBC 11.3* 02/25/2015   HGB 9.3* 02/25/2015   HCT 28.1* 02/25/2015   MCV 81.5 02/25/2015   PLT 253 02/25/2015   NEUTROABS 8.9* 02/25/2015    Imaging:  Ct Soft Tissue Neck W Contrast  02/22/2015  CLINICAL DATA:  Gastric cancer with left lower neck pain. EXAM: CT NECK WITH CONTRAST TECHNIQUE: Multidetector CT imaging of the neck was performed using the standard protocol following the bolus administration of intravenous contrast. CONTRAST:  55m OMNIPAQUE IOHEXOL 300 MG/ML  SOLN COMPARISON:  PET-CT 11/11/2013 FINDINGS: Pharynx and larynx: Motion at the level of the larynx and hypopharynx, but no suspicious enhancement or asymmetry. There is a chronic roughly 12 mm lateral outpouching containing debris and gas, left lateral near the esophageal verge. Communication with the pharynx/esophagus not characterized by CT. Salivary glands: Atrophic appearance,  especially of the parotid and right submandibular glands. No evidence of mass lesion or inflammation. Thyroid: Bilateral sub cm nodules. Lymph nodes: No enlarged or necrotic appearing lymph nodes, with close attention to the left supraclavicular fossa. Vascular: Right IJ porta catheter, visualized portions unremarkable. Limited intracranial: Negative Visualized orbits: Sunken globes.  No evidence of mass lesion. Mastoids and visualized paranasal sinuses: Clear Skeleton: No acute finding or aggressive process in the visualized skeleton. Upper chest: Reported separately IMPRESSION: 1. No evidence of metastatic disease to the neck. No explanation for left neck pain. 2. 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 Electronically Signed   By: JMonte FantasiaM.D.   On: 02/22/2015 17:02   Ct Chest W Contrast  02/22/2015  CLINICAL DATA:  Gastric cancer diagnosed in June 2015, status post completion of chemotherapy in February 2016, with history of partial gastrectomy, presenting for chest restaging. EXAM: CT CHEST WITH CONTRAST TECHNIQUE: Multidetector CT imaging of the chest was performed during intravenous contrast administration. CONTRAST:  762mOMNIPAQUE IOHEXOL 300 MG/ML  SOLN COMPARISON:  06/16/2014 chest CT.  01/27/2015 CT abdomen/pelvis. FINDINGS: Mediastinum/Nodes: Normal heart size. No pericardial fluid/thickening. Right internal jugular MediPort terminates at the cavoatrial junction. Great vessels are normal in course and caliber. No central pulmonary emboli. Stable hypodense 0.5 cm right thyroid lobe nodule. Stable hypodense 0.3 cm left thyroid lobe nodule. There is fluid throughout the thoracic esophageal lumen, with no definite esophageal wall thickening. No pathologically enlarged axillary, mediastinal or hilar lymph nodes. There is near complete resolution of the previously described thymic hyperplasia. Lungs/Pleura: No pneumothorax. No pleural effusion. There is mild platelike scarring  versus atelectasis  in the anterior basilar right lower lobe in lingula, decreased since 01/27/2015. There is a new 4 mm solid right lower lobe pulmonary nodule (series 5/ image 27 and best seen on the coronal sequence series 602/ image 92). No acute consolidative airspace disease, additional significant pulmonary nodules or lung masses. Upper abdomen: The partially visualized percutaneous internal external biliary drain traversing the right liver lobe appears stable in position with the partially visualized distal pigtail portion in the duodenal lumen. There is stable mild-to-moderate diffuse intrahepatic biliary ductal dilatation. Stable postsurgical changes from partial distal gastrectomy with gastrojejunostomy. Stable main pancreatic duct dilation and pancreatic atrophy in the pancreatic body and tail, with stable heterogeneous enhancement and fullness of the pancreatic head. Small amount of perihepatic fluid surrounding the caudate lobe, slightly increased. Musculoskeletal:  No aggressive appearing focal osseous lesions. IMPRESSION: 1. New solitary 4 mm right lower lobe pulmonary nodule, indeterminate, pulmonary metastasis not excluded. Recommend attention to this nodule on follow-up chest CT in 3 months. 2. Mild bibasilar scarring versus atelectasis, decreased. No acute consolidative airspace disease. 3. No thoracic adenopathy. 4. Prominent fluid throughout the thoracic esophageal lumen, suggesting esophageal dysmotility and/or gastroesophageal reflux. 5. Stable intrahepatic biliary ductal dilatation, right percutaneous biliary drain position and pancreatic head heterogeneity and fullness. Small amount of perihepatic fluid surrounding the caudate lobe, slightly increased. Electronically Signed   By: Ilona Sorrel M.D.   On: 02/22/2015 17:08   Ir Biliary Stent(s) Existing Access Inc Dilation Cath Exchange  02/23/2015  CLINICAL DATA:  44 year old female with metastatic malignant gastric carcinoma and  obstructed jaundice. A percutaneous biliary drain was placed on 11/19/2014. She has had issues with intermittent bacteremia and biliary stasis despite tube placement. Her life expectancy is less than 1 year. Therefore, we will place a permanent self expanding biliary stent today. EXAM: IR BILIARY STENT EXISTING ACCESS DILATION, CATH EXCHANGE Date: 02/23/2015 PROCEDURE: 1. Cholangiogram through existing access 2. Placement of a covered wall flex biliary stent across the common bile duct obstruction 3. Exchange for a new 10 French percutaneous biliary drain Interventional Radiologist:  Criselda Peaches, MD ANESTHESIA/SEDATION: Moderate (conscious) sedation was used. 5 mg Versed, 100 mcg Fentanyl were administered intravenously. The patient's vital signs were monitored continuously by radiology nursing throughout the procedure. Sedation Time: 22 minutes MEDICATIONS: 400 mg ciprofloxacin administered intravenously within 1 hour of skin incision FLUOROSCOPY TIME:  7 minutes for a total of 206 mGy CONTRAST:  25 mL Omnipaque 300 TECHNIQUE: Informed consent was obtained from the patient following explanation of the procedure, risks, benefits and alternatives. The patient understands, agrees and consents for the procedure. All questions were addressed. A time out was performed. Maximal barrier sterile technique utilized including caps, mask, sterile gowns, sterile gloves, large sterile drape, hand hygiene, and Betadine skin prep. The existing biliary drainage catheter was cut and removed over an Amplatz wire. A 9 French sheath was advanced into the biliary tree in cholangiography performed in multiple obliquities. There is complete occlusion of the common bile duct from the region of surgical staples to the ampulla. The intrahepatic bile ducts are not dilated and slightly irregular. Measurements were obtained. The stenosis is just over 4 cm in length. Therefore, a 6 cm stent will be placed. A wall flexed 10 mm x 60 mm  stent was then advanced through the sheath and positioned across the obstruction. The stent was deployed. A new Cook 10.2 [place] biliary drainage catheter was then advanced over the wire and formed with the locking loop through the  stent in the duodenum and the proximal side holes in the right posterior hepatic ducts. The tube was left to gravity bag drainage. COMPLICATIONS: None Estimated blood loss:  0 IMPRESSION: 1. Successful placement of a permanent internal self expanding biliary stent. 2. Successful exchange for a new 10.2 French percutaneous internal/external biliary drainage catheter. PLAN: 1. Gravity bag drainage for the next 24 hours. 2. Cap tube in 24 hours. 3. Return to interventional radiology in 2 weeks for cholangiogram and possible tube removal if the patient is tolerating her capped trial and the internal stent is providing adequate drainage of the biliary tree. Signed, Criselda Peaches, MD [facility] Electronically Signed   By: Jacqulynn Cadet M.D.   On: 02/23/2015 14:22    Medications: I have reviewed the patient's current medications.  Assessment/Plan: 1. Gastric cancer status post upper endoscopy 09/22/2013 with findings of a partially obstructing oozing cratered gastric ulcer in the gastric antrum.  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   CT scans chest/abdomen/pelvis 10/03/2013 showed a 7 x 5 mm groundglass opacity in the superior segment of the right lower lobe; a 4.5 mm nodular density in the right upper lobe; bilateral axillary adenopathy; low density area anteriorly in the left hepatic lobe most consistent with fatty infiltration; another hypodense area within the medial segment of the left hepatic lobe; severe gastric distention; mild bilateral inguinal adenopathy.   Subtotal gastrectomy and lymph node dissection with creation of a gastrojejunostomy 10/10/2013. No  evidence of distant metastatic disease noted at the time of surgery. No evidence of liver metastases. 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 Tumor involved the serosal surface. Resection margins were negative. 10 of 17 lymph nodes were positive for metastatic adenocarcinoma. Her-2 not amplified   Cycle 1 weekly 5-FU/leucovorin 11/14/2013, dose reduced beginning with week 3 secondary to mucositis and hand/foot syndrome.   Cycle 2 weekly 5-FU/leucovorin beginning 12/19/2013.  Initiation of radiation/Xeloda 01/26/2014. She decided to discontinue further radiation/Xeloda after one fraction due to significant nausea/vomiting.  Cycle 3 weekly 5-FU/leucovorin beginning 02/13/2014  Cycle 4 weekly 5-FU/leucovorin beginning 03/18/2014  Cycle 5 weekly 5-FU/leucovorin beginning 04/28/2014  CTs 06/16/2014 with new enhancing nodular lesions along the ventral peritoneal surface  CT-guided biopsy of an anterior peritoneal lesion on 06/24/2014 confirmed poorly differentiated adenocarcinoma  CTs 09/03/2014 with an increasing confluent enhancing lesion located at the ventral peritoneal surface midline of the anterior abdomen. Lesion inseparable from the posterior aspect of the abdominal wall.  CT abdomen/pelvis 11/18/2014 with significant progression of metastatic disease with enlarging metastasis within the anterior abdominal wall, enlargement of multiple peritoneal implants, interval development of masses within the pancreatic head and caudate lobe of the liver. 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 New complex solid and cystic left adnexal lesion.  Placement of percutaneous internal/external biliary drain 11/19/2014, permanent biliary stent placed 02/23/2015  Cycle 1 FOLFOX 12/09/2014  Cycle 2 FOLFOX 12/23/2014  CT 01/06/2015 with a decrease in the anterior abdominal wall  tumor 2. Gastric outlet obstruction secondary to #1. Resolved. 3. Nausea/vomiting secondary to #2. Resolved. 4. Abdominal /flank pain secondary to #1 5. History of weight loss. 6. Microcytic anemia, likely iron deficiency. She received iron dextran 10/11/2013. 7. Lupus/Sjgren's. 8. Pneumonia during hospitalization July 2015.  9. Abdominal abscesses with body fluid culture 10/20/2013 showing moderate Candida tropicalis. She was discharged home on a 4 week course of fluconazole.  10. Mucositis and  hand/foot syndrome following cycle 1-week #3 5-FU/leucovorin, the 5-FU and leucovorin were dose reduced. 11. 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 12. Status post LEEP 11/07/2013. 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 Pap smear 06/08/2014-negative for intraepithelial lesions or malignancy 13. Gram-negative sepsis 01/06/2015-blood cultures positive for Klebsiella/Escherichia coli, culture from biliary drainage positive for Enterobacter 14. New right hydronephrosis on CT 01/06/2015, stable on ultrasound 01/21/2015, mildly progressive right hydronephrosis secondary to any elongated obstructing mass on the CT 01/30/2015 status post placement of a right ureter stent 02/01/2015 15. Admission 01/20/2015 with fever/sepsis, one blood culture positive for Klebsiella pneumoniae, urine culture positive for a resistant Escherichia coli 16. Diarrhea-negative stool for C. difficile on 01/30/2015 and 02/09/2015, potentially related to antibiotic therapy, she will begin a probiotic and Lomotil 17. Pain at the left low neck/shoulder-CTs of the neck and chest 02/22/2015 revealed no explanation for the next/shoulder pain    Disposition:  She presents today to resume FOLFOX chemotherapy. She has increased pain today and does not wish to have chemotherapy. I suspect the pain is related to tumor in the abdomen/retroperitoneum. We increase the MS  Contin and add Roxanol for breakthrough pain. She will receive a dose of IV morphine at the Cancer center today.  She will return for an office visit and schedule chemotherapy 03/03/2015. We will consider a Hospice referral if she is unable to complete chemotherapy on 03/03/2015.  Andrea Best will contact us if the pain is not relieved with the current narcotic regimen.  Betsy Coder, MD  02/25/2015  1:12 PM

## 2015-02-25 NOTE — Patient Instructions (Signed)
Morphine injection solution What is this medicine? MORPHINE (MOR feen) is a pain reliever. It is used to treat moderate to severe pain. This medicine may be used for other purposes; ask your health care provider or pharmacist if you have questions. What should I tell my health care provider before I take this medicine? They need to know if you have any of these conditions: -brain tumor -drug abuse or addiction -head injury -heart disease -frequently drink alcohol containing drinks -intestinal disease -kidney disease or problems urinating -kyphoscoliosis -liver disease -lung or breathing disease, like asthma -seizures -taken an MAOI like Carbex, Eldepryl, Marplan, Nardil, or Parnate in last 14 days -an unusual or allergic reaction to morphine, other pain medicines, foods, dyes, or preservatives -pregnant or trying to get pregnant -breast-feeding How should I use this medicine? This medicine is for injection into a muscle, vein, or under the skin. It is usually given by a health care professional in a hospital or clinic setting. If you get this medicine at home, you will be taught how to prepare and give this medicine. Use exactly as directed. Take your medicine at regular intervals. Do not take your medicine more often than directed. Always look at your medicine before using it. Do not use the injection if its color is darker than pale yellow or if it is discolored in any other way. Do not use this medicine if it is cloudy, thickened, colored, or has solid particles in it. It is important that you put your used needles and syringes in a special sharps container. Do not put them in a trash can. If you do not have a sharps container, call your pharmacist or healthcare provider to get one. Talk to your pediatrician regarding the use of this medicine in children. Special care may be needed. Overdosage: If you think you have taken too much of this medicine contact a poison control center or  emergency room at once. NOTE: This medicine is only for you. Do not share this medicine with others. What if I miss a dose? If you miss a dose, take it as soon as you can. If it is almost time for your next dose, take only that dose. Do not take double or extra doses. What may interact with this medicine? Do not take this medicine with any of the following medications: -MAOIs like Carbex, Eldepryl, Marplan, Nardil, and Parnate This medicine may also interact with the following medications: -alcohol -antihistamines -barbiturates, like phenobarbital -medicines for depression, anxiety, or psychotic disturbances -medicines for sleep -muscle relaxants -naltrexone, naloxone -narcotic medicines (opiates) for pain -rifampin -tramadol This list may not describe all possible interactions. Give your health care provider a list of all the medicines, herbs, non-prescription drugs, or dietary supplements you use. Also tell them if you smoke, drink alcohol, or use illegal drugs. Some items may interact with your medicine. What should I watch for while using this medicine? Tell your doctor or health care professional if your pain does not go away, if it gets worse, or if you have new or a different type of pain. You may develop tolerance to the medicine. Tolerance means that you will need a higher dose of the medicine for pain relief. Tolerance is normal and is expected if you take this medicine for a long time. Do not suddenly stop taking your medicine because you may develop a severe reaction. Your body becomes used to the medicine. This does NOT mean you are addicted. Addiction is a behavior related to getting  and using a drug for a non-medical reason. If you have pain, you have a medical reason to take pain medicine. Your doctor will tell you how much medicine to take. If your doctor wants you to stop the medicine, the dose will be slowly lowered over time to avoid any side effects. You may get drowsy or  dizzy. Do not drive, use machinery, or do anything that needs mental alertness until you know how this medicine affects you. Do not stand or sit up quickly, especially if you are an older patient. This reduces the risk of dizzy or fainting spells. Alcohol may interfere with the effect of this medicine. Avoid alcoholic drinks. There are different types of narcotic medicines (opiates) for pain. If you take more than one type at the same time, you may have more side effects. Give your health care provider a list of all medicines you use. Your doctor will tell you how much medicine to take. Do not take more medicine than directed. Call emergency for help if you have problems breathing. This medicine will cause constipation. Try to have a bowel movement at least every 2 to 3 days. If you do not have a bowel movement for 3 days, call your doctor or health care professional. Your mouth may get dry. Drinking water, chewing sugarless gum, or sucking on hard candy may help. See your dentist every 6 months. What side effects may I notice from receiving this medicine? Side effects that you should report to your doctor or health care professional as soon as possible: -allergic reactions like skin rash, itching or hives, swelling of the face, lips, or tongue -breathing problems -change in the amount of urine -confusion -feeling faint or lightheaded -fever, chills -hallucinations -red or sore at the injection site -seizures -slow or fast heartbeat -unusually weak or tired Side effects that usually do not require medical attention (report to your doctor or health care professional if they continue or are bothersome): -constipation -dizziness -headache -nausea, vomiting -pinpoint pupils -sweating This list may not describe all possible side effects. Call your doctor for medical advice about side effects. You may report side effects to FDA at 1-800-FDA-1088. Where should I keep my medicine? Keep out of the  reach of children. This medicine can be abused. Keep it in a safe place to protect it from theft. Do not share this medicine with anyone. Selling or giving away this medicine is dangerous and is against the law. If you are using this medicine at home, you will be instructed on how to store this medicine. Throw away any unused medicine after the expiration date on the label. Discard unused medicine and used packaging carefully. Pets and children can be harmed if they find used or lost packages. NOTE: This sheet is a summary. It may not cover all possible information. If you have questions about this medicine, talk to your doctor, pharmacist, or health care provider.    2016, [employer]. (2012-08-21 21:34:59)

## 2015-02-25 NOTE — Progress Notes (Signed)
REFERRING PROVIDER: Aretta Nip, MD [place], [place] [address]   Betsy Coder, MD  PRIMARY PROVIDER:  Aretta Nip, MD  PRIMARY REASON FOR VISIT:  1. Malignant neoplasm of stomach, unspecified location ([place])   2. Family history of stomach cancer      HISTORY OF PRESENT ILLNESS:   Andrea Best, a 44 y.o. female, was seen for a [facility] cancer genetics consultation at the request of Dr. Benay Spice due to a personal history of cancer.  Andrea Best presents to clinic today to discuss the possibility of a hereditary predisposition to cancer, genetic testing, and to further clarify her future cancer risks, as well as potential cancer risks for family members.   In 2015, at the age of 57, Andrea Best was diagnosed with gastric cancer.   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  This was treated with surgery and chemotherapy.     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  First live birth at age 54.  OCP use for approximately 0 years.  Ovaries intact: yes.  Hysterectomy: no.  Menopausal status: premenopausal.  HRT use: 0 years. Colonoscopy: no; not examined. Mammogram within the last year: yes. Number of breast biopsies: 0. Up to date with pelvic exams:  yes. Any excessive radiation exposure in the past:  no  Past Medical History  Diagnosis Date  . Eczema   . Lupus (HCC)     joint pain and extreme fatique - improved after plaquenil and prednisone therapy  . Sjogren's disease ([place])     caused pt to loose her teeth due to dry mouth;  prone to eye irritation and infection due to dry eye; affects immune system - PRONE TO INFECTIONS   . Allergy   . Tachycardia   . Protein-calorie malnutrition, severe ([place])   . Abnormal findings on esophagogastroduodenoscopy (EGD) 09/22/13     esophagitis  . Asthma     no recent flare ups - no inhalers - as of 11/19/13  . GERD (gastroesophageal reflux disease)   . Anemia     iron deficiency   . Pneumonia     health care -associated July 2015 post op gastrectomy surgery  . Gastric cancer ([place]) 09/22/13 &10/10/13    adenocarcinoma/metastatic  FIRST CHEMO WAS 11/14/13 - NEXT CHEMO TREATMENT IS 11/21/13.    Past Surgical History  Procedure Laterality Date  . Cesarean section      1997  . Laparotomy N/A 10/10/2013    Procedure: SUBTOTAL GASTRECTOMY WITH GASTRIC JEJUNOSOTMY;  Surgeon: Edward Jolly, MD;  [facility];  Service: General;  Laterality: N/A;  . Portacath placement Right 11/20/2013    Procedure: INSERTION PORT-A-CATH with ULTRA SOUND;  Surgeon: Adin Hector, MD;  Location: [facility];  Service: General;  Laterality: Right;  . Cystoscopy w/ ureteral stent placement Right 02/01/2015    Procedure: CYSTOSCOPY WITH RETROGRADE PYELOGRAM/URETERAL STENT PLACEMENT;  Surgeon: Festus Aloe, MD;  [facility];  Service: Urology;  Laterality: Right;    Social History   Social History  . Marital Status: Single    Spouse Name: N/A  . Number of Children: 1  . Years of Education: College   Occupational History  .     Social History Main Topics  . Smoking status: Never Smoker   . Smokeless tobacco:  Never Used  . Alcohol Use: No     Comment: very occasional- none since feb 2015 when pt got sick  . Drug Use: No  . Sexual Activity: Not Asked   Other Topics Concern  . None   Social History Narrative   Single. Lives with daughter (college age)  Lonn [place], and patient's mother.    Work at [employer] as a Research scientist (physical sciences).   Owns her own car. Always wears seatbelt.      FAMILY HISTORY:  We obtained a detailed, 4-generation family history.  Significant diagnoses are listed below: Family History  Problem Relation Age of Onset  . Hypertension Mother   . Diabetes Mother   . Diabetes Maternal Grandmother    . Diabetes Maternal Grandfather   . HIV/AIDS Maternal Uncle   . Stomach cancer Other     MGMs sister   The patient has one daughter, and one full brother who are cancer free.  She has a maternal half sister who is healthy and two paternal half brothers and three paternal half sisters who are healthy.  Her parents are both alive. Her mother has four brothers and three sisters. One brother died of AIDS.  Her maternal grandparents are deceased due to chronic illness.  Her MGMs sister had stomach cancer at an unknown age.  Her father has 7-8 maternal half brothers and an unknown number of paternal half siblings.  His father is reported to have some form of cancer.  Her grandmother is still alive.  Patient's maternal ancestors are of [place] and [place] descent, and paternal ancestors are of [place] and [place] descent. There is no reported Ashkenazi Jewish ancestry. There is no known consanguinity.  GENETIC COUNSELING ASSESSMENT: Andrea Best is a 44 y.o. female with a personal history of stomach cancer which somewhat suggestive of a hereditary cancer syndrome and predisposition to cancer. We, therefore, discussed and recommended the following at today's visit.   DISCUSSION: We discussed that about 3-5% of gastric cancer is the result of a hereditary cancer syndrome.  Most adenocarcinomas are due to Lynch syndrome, however we typically see gastric cancer due to lynch syndrome at older ages.  Andrea Best pathology also suggested signet ring features, which can indicate diffuse cancer.  CDH1 mutations are associated with diffuse gastric cancer at young ages.  Ultimately we can look at multiple genes associated with hereditary gastric cancer syndromes.  We reviewed the characteristics, features and inheritance patterns of hereditary cancer syndromes. We also discussed genetic testing, including the appropriate family members to test, the process of testing, insurance  coverage and turn-around-time for results. We discussed the implications of a negative, positive and/or variant of uncertain significant result. We recommended Andrea Best pursue genetic testing for the [facility] gastric cancer gene panel.   Based on Andrea Best's personal history of cancer, she meets medical criteria for genetic testing. Despite that she meets criteria, she may still have an out of pocket cost. We discussed that if her out of pocket cost for testing is over $100, the laboratory will call and confirm whether she wants to proceed with testing.  If the out of pocket cost of testing is less than $100 she will be billed by the genetic testing laboratory.   PLAN: Despite our recommendation, Andrea Best did not wish to pursue genetic testing at today's visit. We understand this decision, and remain available to coordinate genetic testing at any time in the future. I provided her information about  Lynch syndrome and CDH1 mutations, as well as information about the gastric panel that we were interested in pursuing.  She wanted to read more about the testing but felt that she would pursue it.  We discussed that we could coordinate her blood draw for when she is already scheduled for an appointment.  We recommend Andrea Best continue to follow the cancer screening guidelines given by her primary healthcare provider.  Lastly, we encouraged Andrea Best to remain in contact with cancer genetics annually so that we can continuously update the family history and inform her of any changes in cancer genetics and testing that may be of benefit for this family.   Ms.  Best questions were answered to her satisfaction today. Our contact information was provided should additional questions or concerns arise. Thank you for the referral and allowing [place] to share in the care of your patient.   Andrea Best P. Florene Glen, [place], [facility] Certified Genetic Counselor [email or web address] phone:  [phone]  The patient was seen for a total of 45 minutes in face-to-face genetic counseling.  This patient was discussed with Drs. Magrinat, Lindi Adie and/or Burr Medico who agrees with the above.    _______________________________________________________________________ For Office Staff:  Number of people involved in session: 2 Was an Intern/ student involved with case: no

## 2015-02-25 NOTE — Progress Notes (Signed)
Patient reports she has refused treatment today. Patient reports pain is increased and she cannot tolerate treatment. States she feels better now that she was given IV pain medication. Patient is status post biliary stent. Weight improved and documented as 130.7 pounds December 1 increased from 128.9 pounds November 22. Patient states she is drinking ensure Enlive. She denies any nutrition questions or needs today. I will follow patient as needed.

## 2015-02-26 ENCOUNTER — Ambulatory Visit: Payer: 59

## 2015-02-27 ENCOUNTER — Ambulatory Visit: Payer: 59

## 2015-03-03 ENCOUNTER — Encounter: Payer: Self-pay | Admitting: Nurse Practitioner

## 2015-03-03 ENCOUNTER — Ambulatory Visit (HOSPITAL_BASED_OUTPATIENT_CLINIC_OR_DEPARTMENT_OTHER): Payer: Medicaid Other

## 2015-03-03 ENCOUNTER — Ambulatory Visit (HOSPITAL_BASED_OUTPATIENT_CLINIC_OR_DEPARTMENT_OTHER): Payer: Medicaid Other | Admitting: Nurse Practitioner

## 2015-03-03 ENCOUNTER — Telehealth: Payer: Self-pay | Admitting: Nurse Practitioner

## 2015-03-03 ENCOUNTER — Other Ambulatory Visit (HOSPITAL_BASED_OUTPATIENT_CLINIC_OR_DEPARTMENT_OTHER): Payer: Medicaid Other

## 2015-03-03 VITALS — BP 117/60 | HR 122 | Temp 99.0°F | Resp 19 | Ht 66.0 in | Wt 131.2 lb

## 2015-03-03 DIAGNOSIS — E860 Dehydration: Secondary | ICD-10-CM | POA: Insufficient documentation

## 2015-03-03 DIAGNOSIS — C162 Malignant neoplasm of body of stomach: Secondary | ICD-10-CM

## 2015-03-03 DIAGNOSIS — R7401 Elevation of levels of liver transaminase levels: Secondary | ICD-10-CM

## 2015-03-03 DIAGNOSIS — R740 Nonspecific elevation of levels of transaminase and lactic acid dehydrogenase [LDH]: Secondary | ICD-10-CM | POA: Diagnosis not present

## 2015-03-03 DIAGNOSIS — E8809 Other disorders of plasma-protein metabolism, not elsewhere classified: Secondary | ICD-10-CM | POA: Diagnosis not present

## 2015-03-03 DIAGNOSIS — C169 Malignant neoplasm of stomach, unspecified: Secondary | ICD-10-CM

## 2015-03-03 DIAGNOSIS — G893 Neoplasm related pain (acute) (chronic): Secondary | ICD-10-CM | POA: Diagnosis not present

## 2015-03-03 DIAGNOSIS — Z95828 Presence of other vascular implants and grafts: Secondary | ICD-10-CM

## 2015-03-03 LAB — CBC WITH DIFFERENTIAL/PLATELET
BASO%: 0.2 % (ref 0.0–2.0)
BASOS ABS: 0 10*3/uL (ref 0.0–0.1)
EOS ABS: 0 10*3/uL (ref 0.0–0.5)
EOS%: 0.1 % (ref 0.0–7.0)
HCT: 25.5 % — ABNORMAL LOW (ref 34.8–46.6)
HGB: 8.4 g/dL — ABNORMAL LOW (ref 11.6–15.9)
LYMPH%: 14.5 % (ref 14.0–49.7)
MCH: 26.8 pg (ref 25.1–34.0)
MCHC: 32.9 g/dL (ref 31.5–36.0)
MCV: 81.2 fL (ref 79.5–101.0)
MONO#: 1.4 10*3/uL — AB (ref 0.1–0.9)
MONO%: 13.9 % (ref 0.0–14.0)
NEUT%: 71.3 % (ref 38.4–76.8)
NEUTROS ABS: 7.2 10*3/uL — AB (ref 1.5–6.5)
NRBC: 0 % (ref 0–0)
Platelets: 221 10*3/uL (ref 145–400)
RBC: 3.14 10*6/uL — AB (ref 3.70–5.45)
RDW: 17.6 % — AB (ref 11.2–14.5)
WBC: 10.1 10*3/uL (ref 3.9–10.3)
lymph#: 1.5 10*3/uL (ref 0.9–3.3)

## 2015-03-03 LAB — COMPREHENSIVE METABOLIC PANEL
ALT: 23 U/L (ref 0–55)
AST: 48 U/L — ABNORMAL HIGH (ref 5–34)
Albumin: 1.9 g/dL — ABNORMAL LOW (ref 3.5–5.0)
Alkaline Phosphatase: 137 U/L (ref 40–150)
Anion Gap: 9 mEq/L (ref 3–11)
BILIRUBIN TOTAL: 0.54 mg/dL (ref 0.20–1.20)
BUN: 12.3 mg/dL (ref 7.0–26.0)
CHLORIDE: 102 meq/L (ref 98–109)
CO2: 20 meq/L — AB (ref 22–29)
CREATININE: 1 mg/dL (ref 0.6–1.1)
Calcium: 8 mg/dL — ABNORMAL LOW (ref 8.4–10.4)
EGFR: 80 mL/min/{1.73_m2} — ABNORMAL LOW (ref >90)
GLUCOSE: 115 mg/dL (ref 70–140)
Potassium: 3.9 mEq/L (ref 3.5–5.1)
SODIUM: 131 meq/L — AB (ref 136–145)
TOTAL PROTEIN: 7.4 g/dL (ref 6.4–8.3)

## 2015-03-03 MED ORDER — MORPHINE SULFATE ER 30 MG PO TBCR: 30.0000 mg | EXTENDED_RELEASE_TABLET | Freq: Two times a day (BID) | ORAL | Status: DC

## 2015-03-03 MED ORDER — SODIUM CHLORIDE 0.9 % IJ SOLN
10.0000 mL | INTRAMUSCULAR | Status: DC | PRN
  Administered 2015-03-03: 10 mL via INTRAVENOUS
  Filled 2015-03-03: qty 10

## 2015-03-03 NOTE — Assessment & Plan Note (Signed)
AST is slightly increased to 48 and ALT normal at 23.  Will continue to monitor closely.

## 2015-03-03 NOTE — Progress Notes (Signed)
LM to advise pt Chemo appt 12/8 at 8am.

## 2015-03-03 NOTE — Assessment & Plan Note (Signed)
Patient presented to the [facility] today in hopes of restarting her chemotherapy; but was unfortunately not scheduled for chemotherapy today.  Patient received IV fluid rehydration today due to sodium down to 131.  Patient was also encouraged to push fluids is much as possible.  Patient has plans to return tomorrow for her chemotherapy.

## 2015-03-03 NOTE — Telephone Encounter (Signed)
per pof to sch pt appt-sent MW email to adv to sch trmt-pt aware

## 2015-03-03 NOTE — Assessment & Plan Note (Signed)
Patient last received her FOLFOX chemotherapy on 12/15/2014.  She underwent a biliary stent and drain placement on 02/23/2015; has been continuing to recover from this procedure.  Patient states that she is feeling much better this week; and is prepared to go forward with her chemotherapy.  She denies any recent fevers or chills.  Labs obtained today revealed a WBC of 10.1, ANC 7.2, hemoglobin 8.4, platelet count 221.  Patient will be scheduled for chemotherapy tomorrow, 03/04/2015.  She will also be scheduled for chemotherapy pump discontinuation and her Neulasta injection on 03/06/2015.

## 2015-03-03 NOTE — Patient Instructions (Signed)

## 2015-03-03 NOTE — Progress Notes (Signed)
SYMPTOM MANAGEMENT CLINIC   HPI: [name] 44 y.o. female diagnosed with gastric cancer.  Currently undergoing FOLFOX chemotherapy regimen.   Patient last received her FOLFOX chemotherapy on 12/15/2014.  She underwent a biliary stent and drain placement on 02/23/2015; has been continuing to recover from this procedure.  Patient states that she is feeling much better this week; and is prepared to go forward with her chemotherapy.  She denies any recent fevers or chills.  Labs obtained today revealed a WBC of 10.1, ANC 7.2, hemoglobin 8.4, platelet count 221.  Patient will be scheduled for chemotherapy tomorrow, 03/04/2015.  She will also be scheduled for chemotherapy pump discontinuation and her Neulasta injection on 03/06/2015.  HPI  ROS  Past Medical History  Diagnosis Date  . Eczema   . Lupus (HCC)     joint pain and extreme fatique - improved after plaquenil and prednisone therapy  . Sjogren's disease ([place])     caused pt to loose her teeth due to dry mouth;  prone to eye irritation and infection due to dry eye; affects immune system - PRONE TO INFECTIONS   . Allergy   . Tachycardia   . Protein-calorie malnutrition, severe ([facility])   . Abnormal findings on esophagogastroduodenoscopy (EGD) 09/22/13    esophagitis  . Asthma     no recent flare ups - no inhalers - as of 11/19/13  . GERD (gastroesophageal reflux disease)   . Anemia     iron deficiency   . Pneumonia     health care -associated July 2015 post op gastrectomy surgery  . Gastric cancer ([place]) 09/22/13 &10/10/13    adenocarcinoma/metastatic  FIRST CHEMO WAS 11/14/13 - NEXT CHEMO TREATMENT IS 11/21/13.    Past Surgical History  Procedure Laterality Date  . Cesarean section      1997  . Laparotomy N/A 10/10/2013    Procedure: SUBTOTAL GASTRECTOMY WITH GASTRIC JEJUNOSOTMY;  Surgeon: Edward Jolly, MD;  [facility];  Service: General;  Laterality: N/A;  . Portacath placement Right 11/20/2013   Procedure: INSERTION PORT-A-CATH with ULTRA SOUND;  Surgeon: Adin Hector, MD;  [facility];  Service: General;  Laterality: Right;  . Cystoscopy w/ ureteral stent placement Right 02/01/2015    Procedure: CYSTOSCOPY WITH RETROGRADE PYELOGRAM/URETERAL STENT PLACEMENT;  Surgeon: Festus Aloe, MD;  [facility];  Service: Urology;  Laterality: Right;    has Joint pain; Stomach pain; Gastric cancer ([place]); Gastric outlet obstruction; Anemia, iron deficiency; Protein-calorie malnutrition, severe ([place]); Hypomagnesemia; Fever, unspecified; Tachycardia; Leucocytosis; HCAP (healthcare-associated pneumonia); Candida tropicalis infection; HAP (hospital-acquired pneumonia); Cancer of antrum of stomach ([place]); URI (upper respiratory infection); Hand foot syndrome; Neuropathy due to chemotherapeutic drug ([place]); Anorexia; Transaminitis; Weight loss; Diarrhea; Hypoalbuminemia; Arterial hypotension; Biliary obstruction; Malnutrition of moderate degree; Bacteremia; E coli bacteremia; Bacteremia due to Klebsiella pneumoniae; 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    is allergic to compazine; azithromycin; sulfa antibiotics; hydrocodone-acetaminophen; and other.    Medication List       This list is accurate as of: 03/03/15  5:08 PM.  Always use your most recent med list.               amoxicillin-clavulanate 875-125 MG tablet  Commonly known as:  AUGMENTIN  Take 1 tablet by mouth 2 (two) times daily. For 30days     bismuth  subsalicylate 157 WI/20BT suspension  Commonly known as:  PEPTO BISMOL  Take 30 mLs by mouth every 6 (six) hours as needed for diarrhea or loose stools.     dexamethasone 4 MG tablet  Commonly known as:  DECADRON  Take 2 tablets (8 mg total) by mouth 2 (two) times  daily. Take 8 mg twice a day for two days beginning the day after chemotherapy.     diphenoxylate-atropine 2.5-0.025 MG tablet  Commonly known as:  LOMOTIL  Take 1-2 tablets by mouth 4 (four) times daily as needed for diarrhea or loose stools. Up to 8 per day     DRY EYES OP  Apply 2 drops to eye daily as needed (dry eyes).     feeding supplement Liqd  Take 1 Container by mouth 2 (two) times daily between meals.     FERROUS SULFATE PO  Take 1 tablet by mouth daily.     hydroxychloroquine 200 MG tablet  Commonly known as:  PLAQUENIL  Take 1 tablet (200 mg total) by mouth 2 (two) times daily.     LORazepam 0.5 MG tablet  Commonly known as:  ATIVAN  Take 1 tablet (0.5 mg total) by mouth every 8 (eight) hours as needed for sleep (nausea).     morphine 20 MG/ML concentrated solution  Commonly known as:  ROXANOL  Take 1/2 ml to 51m every 4 hours as need for pain     morphine 30 MG 12 hr tablet  Commonly known as:  MS CONTIN  Take 1 tablet (30 mg total) by mouth every 12 (twelve) hours.     ondansetron 4 MG tablet  Commonly known as:  ZOFRAN  Take 1 tablet (4 mg total) by mouth every 8 (eight) hours as needed for nausea or vomiting.     potassium chloride SA 20 MEQ tablet  Commonly known as:  K-DUR,KLOR-CON  Take 1 tab twice a day for 3 days then take 1 tab daily     PRESCRIPTION MEDICATION  Chemo - [facility]     saccharomyces boulardii 250 MG capsule  Commonly known as:  FLORASTOR  Take 2 capsules (500 mg total) by mouth 2 (two) times daily.         PHYSICAL EXAMINATION  Oncology Vitals 03/03/2015 02/25/2015  Height 168 cm 168 cm  Weight 59.512 kg 59.285 kg  Weight (lbs) 131 lbs 3 oz 130 lbs 11 oz  BMI (kg/m2) 21.18 kg/m2 21.1 kg/m2  Temp 99 97.9  Pulse 122 108  Resp 19 14  SpO2 100 100  BSA (m2) 1.66 m2 1.66 m2   BP Readings from Last 2 Encounters:  03/03/15 117/60  02/25/15 115/63    Physical Exam  Constitutional: She is oriented to person, place, and time and  well-developed, well-nourished, and in no distress.  HENT:  Head: Normocephalic and atraumatic.  Eyes: Conjunctivae and EOM are normal. Pupils are equal, round, and reactive to light. Right eye exhibits no discharge. Left eye exhibits no discharge. No scleral icterus.  Neck: Normal range of motion. Neck supple. No JVD present. No tracheal deviation present. No thyromegaly present.  Cardiovascular: Normal heart sounds and intact distal pulses.   Tachycardia as baseline.  Pulmonary/Chest: Effort normal and breath sounds normal. No respiratory distress. She has no wheezes. She has no rales. She exhibits no tenderness.  Abdominal: Soft. Bowel sounds are normal. She exhibits no distension and no mass. There is no tenderness. There is no rebound and no guarding.  Right upper abdominal  biliary drain tube intact.  Musculoskeletal: Normal range of motion. She exhibits no edema or tenderness.  Lymphadenopathy:    She has no cervical adenopathy.  Neurological: She is alert and oriented to person, place, and time. Gait normal.  Skin: Skin is warm and dry. No rash noted. No erythema. No pallor.  Psychiatric: Affect normal.  Nursing note and vitals reviewed.   LABORATORY DATA:. Appointment on 03/03/2015  Component Date Value Ref Range Status  . WBC 03/03/2015 10.1  3.9 - 10.3 10e3/uL Final  . NEUT# 03/03/2015 7.2* 1.5 - 6.5 10e3/uL Final  . HGB 03/03/2015 8.4* 11.6 - 15.9 g/dL Final  . HCT 03/03/2015 25.5* 34.8 - 46.6 % Final  . Platelets 03/03/2015 221  145 - 400 10e3/uL Final  . MCV 03/03/2015 81.2  79.5 - 101.0 fL Final  . MCH 03/03/2015 26.8  25.1 - 34.0 pg Final  . MCHC 03/03/2015 32.9  31.5 - 36.0 g/dL Final  . RBC 03/03/2015 3.14* 3.70 - 5.45 10e6/uL Final  . RDW 03/03/2015 17.6* 11.2 - 14.5 % Final  . lymph# 03/03/2015 1.5  0.9 - 3.3 10e3/uL Final  . MONO# 03/03/2015 1.4* 0.1 - 0.9 10e3/uL Final  . Eosinophils Absolute 03/03/2015 0.0  0.0 - 0.5 10e3/uL Final  . Basophils Absolute  03/03/2015 0.0  0.0 - 0.1 10e3/uL Final  . NEUT% 03/03/2015 71.3  38.4 - 76.8 % Final  . LYMPH% 03/03/2015 14.5  14.0 - 49.7 % Final  . MONO% 03/03/2015 13.9  0.0 - 14.0 % Final  . EOS% 03/03/2015 0.1  0.0 - 7.0 % Final  . BASO% 03/03/2015 0.2  0.0 - 2.0 % Final  . nRBC 03/03/2015 0  0 - 0 % Final  . Sodium 03/03/2015 131* 136 - 145 mEq/L Final  . Potassium 03/03/2015 3.9  3.5 - 5.1 mEq/L Final  . Chloride 03/03/2015 102  98 - 109 mEq/L Final  . CO2 03/03/2015 20* 22 - 29 mEq/L Final  . Glucose 03/03/2015 115  70 - 140 mg/dl Final   Glucose reference range is for nonfasting patients. Fasting glucose reference range is 70- 100.  [place] Kitchen BUN 03/03/2015 12.3  7.0 - 26.0 mg/dL Final  . Creatinine 03/03/2015 1.0  0.6 - 1.1 mg/dL Final  . Total Bilirubin 03/03/2015 0.54  0.20 - 1.20 mg/dL Final  . Alkaline Phosphatase 03/03/2015 137  40 - 150 U/L Final  . AST 03/03/2015 48* 5 - 34 U/L Final  . ALT 03/03/2015 23  0 - 55 U/L Final  . Total Protein 03/03/2015 7.4  6.4 - 8.3 g/dL Final  . Albumin 03/03/2015 1.9* 3.5 - 5.0 g/dL Final  . Calcium 03/03/2015 8.0* 8.4 - 10.4 mg/dL Final  . Anion Gap 03/03/2015 9  3 - 11 mEq/L Final  . EGFR 03/03/2015 80* >90 ml/min/1.73 m2 Final   eGFR is calculated using the CKD-EPI Creatinine Equation (2009)     RADIOGRAPHIC STUDIES: No results found.  ASSESSMENT/PLAN:    Cancer associated pain Patient continues to have some mild generalized abdominal discomfort; with some increased discomfort around her recent biliary drain insertion site.  She was prescribed MS Contin last week; but states that she had difficulty having this filled at her pharmacy.  She has been taking the Roxanol drops for breakthrough pain with fairly good control.  [place] nurse called patient's local pharmacy; and clarified that patient would be returning with a new printed prescription for the MS Contin to be given twice daily on a regular schedule.  Patient will continue use the Roxanol  for breakthrough pain only.  Dehydration Patient presented to the [facility] today in hopes of restarting her chemotherapy; but was unfortunately not scheduled for chemotherapy today.  Patient received IV fluid rehydration today due to sodium down to 131.  Patient was also encouraged to push fluids is much as possible.  Patient has plans to return tomorrow for her chemotherapy.  Gastric cancer [facility]) Patient last received her FOLFOX chemotherapy on 12/15/2014.  She underwent a biliary stent and drain placement on 02/23/2015; has been continuing to recover from this procedure.  Patient states that she is feeling much better this week; and is prepared to go forward with her chemotherapy.  She denies any recent fevers or chills.  Labs obtained today revealed a WBC of 10.1, ANC 7.2, hemoglobin 8.4, platelet count 221.  Patient will be scheduled for chemotherapy tomorrow, 03/04/2015.  She will also be scheduled for chemotherapy pump discontinuation and her Neulasta injection on 03/06/2015.  Hypoalbuminemia Albumen remains low at 1.9.  Patient was encouraged to push protein in her diet is much as possible.  Transaminitis AST is slightly increased to 48 and ALT normal at 23.  Will continue to monitor closely.  Patient stated understanding of all instructions; and was in agreement with this plan of care. The patient knows to call the clinic with any problems, questions or concerns.   This was a shared visit with Dr. Benay Spice today.  Total time spent with patient was 25 minutes;  with greater than 75 percent of that time spent in face to face counseling regarding patient's symptoms,  and coordination of care and follow up.  Disclaimer:This dictation was prepared with Dragon/digital dictation along with [employer]. Any transcriptional errors that result from this process are unintentional.  Drue Second, NP 03/03/2015   This was a shared visit with Drue Second. Ms. Oshiro  was interviewed and examined. She continues to have abdomen/back pain secondary to metastatic gastric cancer. She will continue Roxanol. She did not fill the MS Contin prescribed last week. She will get this today. She reports tolerating a diet. She would like to resume FOLFOX. The plan is to resume FOLFOX chemotherapy 03/04/2015.  Julieanne Manson, M.D.

## 2015-03-03 NOTE — Assessment & Plan Note (Signed)
Patient continues to have some mild generalized abdominal discomfort; with some increased discomfort around her recent biliary drain insertion site.  She was prescribed MS Contin last week; but states that she had difficulty having this filled at her pharmacy.  She has been taking the Roxanol drops for breakthrough pain with fairly good control.  [place] nurse called patient's local pharmacy; and clarified that patient would be returning with a new printed prescription for the MS Contin to be given twice daily on a regular schedule.  Patient will continue use the Roxanol for breakthrough pain only.

## 2015-03-03 NOTE — Assessment & Plan Note (Signed)
Albumen remains low at 1.9.  Patient was encouraged to push protein in her diet is much as possible.

## 2015-03-04 ENCOUNTER — Ambulatory Visit (INDEPENDENT_AMBULATORY_CARE_PROVIDER_SITE_OTHER): Payer: Medicaid Other | Admitting: Infectious Disease

## 2015-03-04 ENCOUNTER — Encounter: Payer: Self-pay | Admitting: Infectious Disease

## 2015-03-04 ENCOUNTER — Encounter: Payer: Self-pay | Admitting: *Deleted

## 2015-03-04 ENCOUNTER — Ambulatory Visit (HOSPITAL_BASED_OUTPATIENT_CLINIC_OR_DEPARTMENT_OTHER): Payer: Medicaid Other

## 2015-03-04 VITALS — BP 113/71 | HR 119 | Temp 99.2°F

## 2015-03-04 VITALS — BP 124/78 | HR 115 | Temp 97.8°F | Wt 135.0 lb

## 2015-03-04 DIAGNOSIS — K831 Obstruction of bile duct: Secondary | ICD-10-CM | POA: Diagnosis not present

## 2015-03-04 DIAGNOSIS — C169 Malignant neoplasm of stomach, unspecified: Secondary | ICD-10-CM

## 2015-03-04 DIAGNOSIS — B961 Klebsiella pneumoniae [K. pneumoniae] as the cause of diseases classified elsewhere: Secondary | ICD-10-CM

## 2015-03-04 DIAGNOSIS — Z5111 Encounter for antineoplastic chemotherapy: Secondary | ICD-10-CM

## 2015-03-04 DIAGNOSIS — Z228 Carrier of other infectious diseases: Secondary | ICD-10-CM | POA: Diagnosis not present

## 2015-03-04 DIAGNOSIS — C163 Malignant neoplasm of pyloric antrum: Secondary | ICD-10-CM | POA: Diagnosis not present

## 2015-03-04 DIAGNOSIS — R7881 Bacteremia: Secondary | ICD-10-CM | POA: Diagnosis not present

## 2015-03-04 MED ORDER — SODIUM CHLORIDE 0.9 % IV SOLN
Freq: Once | INTRAVENOUS | Status: AC
  Administered 2015-03-04: 09:00:00 via INTRAVENOUS
  Filled 2015-03-04: qty 5

## 2015-03-04 MED ORDER — AMOXICILLIN-POT CLAVULANATE 875-125 MG PO TABS: 1.0000 | ORAL_TABLET | Freq: Two times a day (BID) | ORAL | Status: DC

## 2015-03-04 MED ORDER — PALONOSETRON HCL INJECTION 0.25 MG/5ML
0.2500 mg | Freq: Once | INTRAVENOUS | Status: AC
  Administered 2015-03-04: 0.25 mg via INTRAVENOUS

## 2015-03-04 MED ORDER — HEPARIN SOD (PORK) LOCK FLUSH 100 UNIT/ML IV SOLN
500.0000 [IU] | Freq: Once | INTRAVENOUS | Status: DC | PRN
  Filled 2015-03-04: qty 5

## 2015-03-04 MED ORDER — LEUCOVORIN CALCIUM INJECTION 350 MG
300.0000 mg/m2 | Freq: Once | INTRAVENOUS | Status: AC
  Administered 2015-03-04: 510 mg via INTRAVENOUS
  Filled 2015-03-04: qty 25.5

## 2015-03-04 MED ORDER — PALONOSETRON HCL INJECTION 0.25 MG/5ML
INTRAVENOUS | Status: AC
Start: 2015-03-04 — End: 2015-03-04
  Filled 2015-03-04: qty 5

## 2015-03-04 MED ORDER — OXALIPLATIN CHEMO INJECTION 100 MG/20ML
85.0000 mg/m2 | Freq: Once | INTRAVENOUS | Status: AC
  Administered 2015-03-04: 145 mg via INTRAVENOUS
  Filled 2015-03-04: qty 20

## 2015-03-04 MED ORDER — FLUOROURACIL CHEMO INJECTION 500 MG/10ML
300.0000 mg/m2 | Freq: Once | INTRAVENOUS | Status: AC
  Administered 2015-03-04: 500 mg via INTRAVENOUS
  Filled 2015-03-04: qty 10

## 2015-03-04 MED ORDER — SODIUM CHLORIDE 0.9 % IJ SOLN
10.0000 mL | INTRAMUSCULAR | Status: DC | PRN
  Filled 2015-03-04: qty 10

## 2015-03-04 MED ORDER — DEXTROSE 5 % IV SOLN
Freq: Once | INTRAVENOUS | Status: AC
  Administered 2015-03-04: 09:00:00 via INTRAVENOUS

## 2015-03-04 MED ORDER — SODIUM CHLORIDE 0.9 % IV SOLN
1800.0000 mg/m2 | INTRAVENOUS | Status: DC
  Administered 2015-03-04: 3050 mg via INTRAVENOUS
  Filled 2015-03-04: qty 61

## 2015-03-04 NOTE — Progress Notes (Signed)
Chief complaint: followup for recurrent biliary tract infecitons Subjective:    Patient ID: Andrea Best, female    DOB: [birth year], 44 y.o.   MRN: [id number]  HPI  44 y.o. female with withmetastatic stomach cancer with biliary obstruction sp biliary drain, several admissions with septic shock in mid October due to polymicrobial bactereimia with E coli and Klebsiella PNA. 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 She was dc on cipro and readmitted with septic picture. When I saw her last she was able to stablize on more narrow spectrum unasyn and then augmentin. She has now had a self expanding biliary stent placed in hopes that her biliary flow will be restored and need for drain will be removed. She is receiving chemotherapy again. She still has some RUQ pain but this has not worsened.  Past Medical History  Diagnosis Date  . Eczema   . Lupus (HCC)     joint pain and extreme fatique - improved after plaquenil and prednisone therapy  . Sjogren's disease ([place])     caused pt to loose her teeth due to dry mouth;  prone to eye irritation and infection due to dry eye; affects immune system - PRONE TO INFECTIONS   . Allergy   . Tachycardia   . Protein-calorie malnutrition, severe ([place])   . Abnormal findings on esophagogastroduodenoscopy (EGD) 09/22/13    esophagitis  . Asthma     no recent flare ups - no inhalers - as of 11/19/13  . GERD (gastroesophageal reflux disease)   . Anemia     iron deficiency   . Pneumonia     health care -associated July 2015 post op gastrectomy surgery  . Gastric cancer ([place]) 09/22/13 &10/10/13    adenocarcinoma/metastatic  FIRST CHEMO WAS 11/14/13 - NEXT CHEMO TREATMENT IS 11/21/13.    Past Surgical History  Procedure Laterality Date  . Cesarean section      1997  . Laparotomy N/A 10/10/2013    Procedure: SUBTOTAL GASTRECTOMY WITH GASTRIC JEJUNOSOTMY;  Surgeon: Edward Jolly, MD;  Location:  [facility];  Service: General;  Laterality: N/A;  . Portacath placement Right 11/20/2013    Procedure: INSERTION PORT-A-CATH with ULTRA SOUND;  Surgeon: Adin Hector, MD;  Location: [facility];  Service: General;  Laterality: Right;  . Cystoscopy w/ ureteral stent placement Right 02/01/2015    Procedure: CYSTOSCOPY WITH RETROGRADE PYELOGRAM/URETERAL STENT PLACEMENT;  Surgeon: Festus Aloe, MD;  Location: [facility];  Service: Urology;  Laterality: Right;    Family History  Problem Relation Age of Onset  . Hypertension Mother   . Diabetes Mother   . Diabetes Maternal Grandmother   . Diabetes Maternal Grandfather   . HIV/AIDS Maternal Uncle   . Stomach cancer Other     MGMs sister      Social History   Social History  . Marital Status: Single    Spouse Name: N/A  . Number of Children: 1  . Years of Education: College   Occupational History  .     Social History Main Topics  . Smoking status: Never Smoker   . Smokeless tobacco: Never Used  . Alcohol Use: No     Comment: very occasional- none since feb 2015 when pt got sick  . Drug Use: No  . Sexual Activity: Not Asked   Other Topics Concern  . None   Social History Narrative   Single. Lives with daughter (college age)  Andrea Best, and patient's mother.    Work at [employer] as a Research scientist (physical sciences).   Owns her own car. Always wears seatbelt.     Allergies  Allergen Reactions  . Compazine [Prochlorperazine Maleate] Other (See Comments)    Dystonia  . Azithromycin Rash  . Sulfa Antibiotics Rash    hallucinations  . Hydrocodone-Acetaminophen Nausea And Vomiting    Hallucinating   . 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 SHE DOES NOT WANT ANY BLOOD PRODUCTS OR FRACTIONS     Current outpatient prescriptions:  .  amoxicillin-clavulanate (AUGMENTIN) 875-125 MG tablet, Take 1 tablet by mouth 2 (two) times daily. For 30days, Disp: 60 tablet, Rfl: 3 .  Artificial Tear Ointment (DRY EYES OP), Apply 2 drops to eye daily as  needed (dry eyes)., Disp: , Rfl:  .  bismuth subsalicylate (PEPTO BISMOL) 262 MG/15ML suspension, Take 30 mLs by mouth every 6 (six) hours as needed for diarrhea or loose stools., Disp: , Rfl:  .  dexamethasone (DECADRON) 4 MG tablet, Take 2 tablets (8 mg total) by mouth 2 (two) times daily. Take 8 mg twice a day for two days beginning the day after chemotherapy., Disp: 16 tablet, Rfl: 1 .  diphenoxylate-atropine (LOMOTIL) 2.5-0.025 MG tablet, Take 1-2 tablets by mouth 4 (four) times daily as needed for diarrhea or loose stools. Up to 8 per day, Disp: 60 tablet, Rfl: 0 .  feeding supplement (BOOST / RESOURCE BREEZE) LIQD, Take 1 Container by mouth 2 (two) times daily between meals. (Patient taking differently: Take 1 Container by mouth daily. ), Disp: , Rfl: 0 .  FERROUS SULFATE PO, Take 1 tablet by mouth daily., Disp: , Rfl:  .  hydroxychloroquine (PLAQUENIL) 200 MG tablet, Take 1 tablet (200 mg total) by mouth 2 (two) times daily., Disp: 60 tablet, Rfl: 1 .  LORazepam (ATIVAN) 0.5 MG tablet, Take 1 tablet (0.5 mg total) by mouth every 8 (eight) hours as needed for sleep (nausea)., Disp: 30 tablet, Rfl: 0 .  morphine (MS CONTIN) 30 MG 12 hr tablet, Take 1 tablet (30 mg total) by mouth every 12 (twelve) hours., Disp: 60 tablet, Rfl: 0 .  morphine (ROXANOL) 20 MG/ML concentrated solution, Take 1/2 ml to 49ml every 4 hours as need for pain, Disp: 120 mL, Rfl: 0 .  ondansetron (ZOFRAN) 4 MG tablet, Take 1 tablet (4 mg total) by mouth every 8 (eight) hours as needed for nausea or vomiting., Disp: 20 tablet, Rfl: 3 .  potassium chloride SA (K-DUR,KLOR-CON) 20 MEQ tablet, Take 1 tab twice a day for 3 days then take 1 tab daily, Disp: 36 tablet, Rfl: 1 .  PRESCRIPTION MEDICATION, Chemo - [facility], Disp: , Rfl:  .  saccharomyces boulardii (FLORASTOR) 250 MG capsule, Take 2 capsules (500 mg total) by mouth 2 (two) times daily., Disp: 120 capsule, Rfl: 1     Review of Systems  Constitutional: Negative for fever,  chills, diaphoresis, activity change, appetite change, fatigue and unexpected weight change.  HENT: Negative for congestion, rhinorrhea, sinus pressure, sneezing, sore throat and trouble swallowing.   Eyes: Negative for photophobia and visual disturbance.  Respiratory: Negative for cough, chest tightness, shortness of breath, wheezing and stridor.   Cardiovascular: Negative for chest pain, palpitations and leg swelling.  Gastrointestinal: Positive for abdominal pain. Negative for nausea, vomiting, diarrhea, constipation, blood in stool, abdominal distention and anal bleeding.  Genitourinary: Negative for dysuria, hematuria, flank pain and difficulty urinating.  Musculoskeletal: Negative for myalgias, back  pain, joint swelling, arthralgias and gait problem.  Skin: Negative for color change, pallor, rash and wound.  Neurological: Negative for dizziness, tremors, weakness and light-headedness.  Hematological: Negative for adenopathy. Does not bruise/bleed easily.  Psychiatric/Behavioral: Negative for behavioral problems, confusion, sleep disturbance, dysphoric mood, decreased concentration and agitation.       Objective:   Physical Exam  Constitutional: She is oriented to person, place, and time. No distress.  HENT:  Head: Normocephalic and atraumatic.  Eyes: Conjunctivae and EOM are normal. No scleral icterus.  Neck: Normal range of motion. Neck supple.  Cardiovascular: Normal rate and regular rhythm.   Pulmonary/Chest: Effort normal. No respiratory distress. She has no wheezes.  Abdominal: She exhibits no distension.  Neurological: She is alert and oriented to person, place, and time. She exhibits normal muscle tone. Coordination normal.  Skin: Skin is warm and dry. No rash noted. She is not diaphoretic. No erythema.  Psychiatric: She has a normal mood and affect. Her behavior is normal. Judgment and thought content normal.  Nursing note and vitals reviewed.         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 We will leave her on augmentin for now. She is due to have her stent and drains evaluated by IR in next 2 weeks and we can then consider stopping the abx

## 2015-03-04 NOTE — Progress Notes (Signed)
Ok to treat with HR 119 per Dr. Benay Spice.

## 2015-03-04 NOTE — Progress Notes (Signed)
Oncology Nurse Navigator Documentation  Oncology Nurse Navigator Flowsheets 03/04/2015  Navigator Encounter Type Treatment  Patient Visit Type Medonc  Treatment Phase Treatment #3-FOLFOX  Barriers/Navigation Needs No barriers at this time  Education -  Interventions -  Coordination of Care -  Time Spent with Patient 5  Brief encounter w/patient as she was leaving. Was able to have her chemo today and very pleased. Feeling well and eating well. Thinks she has gained some weight. Denies any navigation needs at this time.

## 2015-03-04 NOTE — Patient Instructions (Signed)
Cancer Center Discharge Instructions for Patients Receiving Chemotherapy  Today you received the following chemotherapy agents Oxaliplatin/Leucovorin/5 FU  To help prevent nausea and vomiting after your treatment, we encourage you to take your nausea medication as prescribed.   If you develop nausea and vomiting that is not controlled by your nausea medication, call the clinic.   BELOW ARE SYMPTOMS THAT SHOULD BE REPORTED IMMEDIATELY:  *FEVER GREATER THAN 100.5 F  *CHILLS WITH OR WITHOUT FEVER  NAUSEA AND VOMITING THAT IS NOT CONTROLLED WITH YOUR NAUSEA MEDICATION  *UNUSUAL SHORTNESS OF BREATH  *UNUSUAL BRUISING OR BLEEDING  TENDERNESS IN MOUTH AND THROAT WITH OR WITHOUT PRESENCE OF ULCERS  *URINARY PROBLEMS  *BOWEL PROBLEMS  UNUSUAL RASH Items with * indicate a potential emergency and should be followed up as soon as possible.  Feel free to call the clinic you have any questions or concerns. The clinic phone number is [phone].  Please show the CHEMO ALERT CARD at check-in to the Emergency Department and triage nurse.   

## 2015-03-06 ENCOUNTER — Ambulatory Visit (HOSPITAL_BASED_OUTPATIENT_CLINIC_OR_DEPARTMENT_OTHER): Payer: Medicaid Other

## 2015-03-06 ENCOUNTER — Ambulatory Visit: Payer: Medicaid Other

## 2015-03-06 DIAGNOSIS — C162 Malignant neoplasm of body of stomach: Secondary | ICD-10-CM

## 2015-03-06 DIAGNOSIS — C169 Malignant neoplasm of stomach, unspecified: Secondary | ICD-10-CM

## 2015-03-06 DIAGNOSIS — Z5189 Encounter for other specified aftercare: Secondary | ICD-10-CM | POA: Diagnosis not present

## 2015-03-06 MED ORDER — HEPARIN SOD (PORK) LOCK FLUSH 100 UNIT/ML IV SOLN
500.0000 [IU] | Freq: Once | INTRAVENOUS | Status: AC | PRN
  Administered 2015-03-06: 500 [IU]
  Filled 2015-03-06: qty 5

## 2015-03-06 MED ORDER — SODIUM CHLORIDE 0.9 % IJ SOLN
10.0000 mL | INTRAMUSCULAR | Status: DC | PRN
  Administered 2015-03-06: 10 mL
  Filled 2015-03-06: qty 10

## 2015-03-06 MED ORDER — PEGFILGRASTIM INJECTION 6 MG/0.6ML ~~LOC~~
6.0000 mg | PREFILLED_SYRINGE | Freq: Once | SUBCUTANEOUS | Status: AC
  Administered 2015-03-06: 6 mg via SUBCUTANEOUS

## 2015-03-06 NOTE — Progress Notes (Signed)
Charted on 9:15 encounter.

## 2015-03-07 ENCOUNTER — Encounter (HOSPITAL_COMMUNITY): Payer: Self-pay | Admitting: *Deleted

## 2015-03-07 ENCOUNTER — Emergency Department (HOSPITAL_COMMUNITY): Payer: Medicaid Other

## 2015-03-07 ENCOUNTER — Emergency Department (HOSPITAL_COMMUNITY)
Admission: EM | Admit: 2015-03-07 | Discharge: 2015-03-07 | Disposition: A | Discharge status: Home / Self Care | Payer: Medicaid Other | Attending: Emergency Medicine | Admitting: Emergency Medicine

## 2015-03-07 ENCOUNTER — Ambulatory Visit (HOSPITAL_COMMUNITY)
Admission: RE | Admit: 2015-03-07 | Discharge: 2015-03-07 | Disposition: A | Discharge status: Home / Self Care | Payer: Medicaid Other | Source: Ambulatory Visit | Attending: Emergency Medicine | Admitting: Emergency Medicine

## 2015-03-07 ENCOUNTER — Inpatient Hospital Stay (HOSPITAL_COMMUNITY): Admission: RE | Admit: 2015-03-07 | Payer: Medicaid Other | Source: Ambulatory Visit

## 2015-03-07 ENCOUNTER — Other Ambulatory Visit: Payer: Self-pay

## 2015-03-07 DIAGNOSIS — Z8719 Personal history of other diseases of the digestive system: Secondary | ICD-10-CM | POA: Diagnosis not present

## 2015-03-07 DIAGNOSIS — C169 Malignant neoplasm of stomach, unspecified: Secondary | ICD-10-CM | POA: Diagnosis not present

## 2015-03-07 DIAGNOSIS — M3500 Sicca syndrome, unspecified: Secondary | ICD-10-CM | POA: Insufficient documentation

## 2015-03-07 DIAGNOSIS — D509 Iron deficiency anemia, unspecified: Secondary | ICD-10-CM | POA: Insufficient documentation

## 2015-03-07 DIAGNOSIS — R609 Edema, unspecified: Secondary | ICD-10-CM

## 2015-03-07 DIAGNOSIS — Z8701 Personal history of pneumonia (recurrent): Secondary | ICD-10-CM | POA: Diagnosis not present

## 2015-03-07 DIAGNOSIS — R2241 Localized swelling, mass and lump, right lower limb: Secondary | ICD-10-CM | POA: Insufficient documentation

## 2015-03-07 DIAGNOSIS — M7989 Other specified soft tissue disorders: Secondary | ICD-10-CM

## 2015-03-07 DIAGNOSIS — Z79899 Other long term (current) drug therapy: Secondary | ICD-10-CM | POA: Insufficient documentation

## 2015-03-07 DIAGNOSIS — R2242 Localized swelling, mass and lump, left lower limb: Secondary | ICD-10-CM | POA: Diagnosis not present

## 2015-03-07 DIAGNOSIS — Z872 Personal history of diseases of the skin and subcutaneous tissue: Secondary | ICD-10-CM | POA: Diagnosis not present

## 2015-03-07 DIAGNOSIS — J45909 Unspecified asthma, uncomplicated: Secondary | ICD-10-CM | POA: Insufficient documentation

## 2015-03-07 LAB — CBC WITH DIFFERENTIAL/PLATELET
BASOS ABS: 0 10*3/uL (ref 0.0–0.1)
Basophils Relative: 0 %
Eosinophils Absolute: 0 10*3/uL (ref 0.0–0.7)
Eosinophils Relative: 0 %
HCT: 25.9 % — ABNORMAL LOW (ref 36.0–46.0)
Hemoglobin: 8.9 g/dL — ABNORMAL LOW (ref 12.0–15.0)
LYMPHS ABS: 1.4 10*3/uL (ref 0.7–4.0)
Lymphocytes Relative: 5 %
MCH: 28.3 pg (ref 26.0–34.0)
MCHC: 34.4 g/dL (ref 30.0–36.0)
MCV: 82.5 fL (ref 78.0–100.0)
MONO ABS: 0 10*3/uL — AB (ref 0.1–1.0)
Monocytes Relative: 0 %
Neutro Abs: 25.8 10*3/uL — ABNORMAL HIGH (ref 1.7–7.7)
Neutrophils Relative %: 95 %
Platelets: 257 10*3/uL (ref 150–400)
RBC: 3.14 MIL/uL — AB (ref 3.87–5.11)
RDW: 18 % — AB (ref 11.5–15.5)
WBC: 27.2 10*3/uL — AB (ref 4.0–10.5)

## 2015-03-07 LAB — URINE MICROSCOPIC-ADD ON

## 2015-03-07 LAB — COMPREHENSIVE METABOLIC PANEL
ALBUMIN: 2.1 g/dL — AB (ref 3.5–5.0)
ALT: 32 U/L (ref 14–54)
ANION GAP: 6 (ref 5–15)
AST: 70 U/L — ABNORMAL HIGH (ref 15–41)
Alkaline Phosphatase: 114 U/L (ref 38–126)
BILIRUBIN TOTAL: 0.7 mg/dL (ref 0.3–1.2)
BUN: 13 mg/dL (ref 6–20)
CO2: 26 mmol/L (ref 22–32)
Calcium: 8 mg/dL — ABNORMAL LOW (ref 8.9–10.3)
Chloride: 98 mmol/L — ABNORMAL LOW (ref 101–111)
Creatinine, Ser: 0.92 mg/dL (ref 0.44–1.00)
GFR calc non Af Amer: >60 mL/min (ref >60)
GLUCOSE: 95 mg/dL (ref 65–99)
POTASSIUM: 3.7 mmol/L (ref 3.5–5.1)
SODIUM: 130 mmol/L — AB (ref 135–145)
TOTAL PROTEIN: 7.3 g/dL (ref 6.5–8.1)

## 2015-03-07 LAB — TROPONIN I: Troponin I: <0.03 ng/mL (ref <0.031)

## 2015-03-07 LAB — URINALYSIS, ROUTINE W REFLEX MICROSCOPIC
Bilirubin Urine: NEGATIVE
GLUCOSE, UA: NEGATIVE mg/dL
Hgb urine dipstick: NEGATIVE
Ketones, ur: NEGATIVE mg/dL
NITRITE: NEGATIVE
PROTEIN: NEGATIVE mg/dL
Specific Gravity, Urine: 1.009 (ref 1.005–1.030)
pH: 6.5 (ref 5.0–8.0)

## 2015-03-07 LAB — BRAIN NATRIURETIC PEPTIDE: B NATRIURETIC PEPTIDE 5: 94.7 pg/mL (ref 0.0–100.0)

## 2015-03-07 LAB — D-DIMER, QUANTITATIVE (NOT AT ARMC): D DIMER QUANT: 4.53 ug{FEU}/mL — AB (ref 0.00–0.50)

## 2015-03-07 MED ORDER — ENOXAPARIN SODIUM 60 MG/0.6ML ~~LOC~~ SOLN
60.0000 mg | Freq: Once | SUBCUTANEOUS | Status: AC
  Administered 2015-03-07: 60 mg via SUBCUTANEOUS
  Filled 2015-03-07: qty 0.6

## 2015-03-07 MED ORDER — POTASSIUM CHLORIDE CRYS ER 20 MEQ PO TBCR: 20.0000 meq | EXTENDED_RELEASE_TABLET | Freq: Every day | ORAL | Status: AC

## 2015-03-07 MED ORDER — FUROSEMIDE 20 MG PO TABS: 20.0000 mg | ORAL_TABLET | Freq: Every day | ORAL | Status: DC

## 2015-03-07 NOTE — Progress Notes (Signed)
VASCULAR LAB PRELIMINARY  PRELIMINARY  PRELIMINARY  PRELIMINARY  Bilateral lower extremity venous duplex completed.    Preliminary report:  There is no DVT or SVT noted in the bilateral lower extremities.  There is interstitial fluid noted in the left calf.   Dewie Ahart, RVT 03/07/2015, 5:29 PM

## 2015-03-07 NOTE — Discharge Instructions (Signed)
Peripheral Edema You have swelling in your legs (peripheral edema). This swelling is due to excess accumulation of salt and water in your body. Edema may be a sign of heart, kidney or liver disease, or a side effect of a medication. It may also be due to problems in the leg veins. Elevating your legs and using special support stockings may be very helpful, if the cause of the swelling is due to poor venous circulation. Avoid long periods of standing, whatever the cause. Treatment of edema depends on identifying the cause. Chips, pretzels, pickles and other salty foods should be avoided. Restricting salt in your diet is almost always needed. Water pills (diuretics) are often used to remove the excess salt and water from your body via urine. These medicines prevent the kidney from reabsorbing sodium. This increases urine flow. Diuretic treatment may also result in lowering of potassium levels in your body. Potassium supplements may be needed if you have to use diuretics daily. Daily weights can help you keep track of your progress in clearing your edema. You should call your caregiver for follow up care as recommended. SEEK IMMEDIATE MEDICAL CARE IF:   You have increased swelling, pain, redness, or heat in your legs.  You develop shortness of breath, especially when lying down.  You develop chest or abdominal pain, weakness, or fainting.  You have a fever.   This information is not intended to replace advice given to you by your health care provider. Make sure you discuss any questions you have with your health care provider.   Document Released: 04/20/2004 Document Revised: 06/05/2011 Document Reviewed: 09/23/2014 Elsevier Interactive Patient Education 2016 [employer]  Peripheral Edema You have swelling in your legs (peripheral edema). This swelling is due to excess accumulation of salt and water in your body. Edema may be a sign of heart, kidney or liver disease, or a side effect of a  medication. It may also be due to problems in the leg veins. Elevating your legs and using special support stockings may be very helpful, if the cause of the swelling is due to poor venous circulation. Avoid long periods of standing, whatever the cause. Treatment of edema depends on identifying the cause. Chips, pretzels, pickles and other salty foods should be avoided. Restricting salt in your diet is almost always needed. Water pills (diuretics) are often used to remove the excess salt and water from your body via urine. These medicines prevent the kidney from reabsorbing sodium. This increases urine flow. Diuretic treatment may also result in lowering of potassium levels in your body. Potassium supplements may be needed if you have to use diuretics daily. Daily weights can help you keep track of your progress in clearing your edema. You should call your caregiver for follow up care as recommended. SEEK IMMEDIATE MEDICAL CARE IF:   You have increased swelling, pain, redness, or heat in your legs.  You develop shortness of breath, especially when lying down.  You develop chest or abdominal pain, weakness, or fainting.  You have a fever.   This information is not intended to replace advice given to you by your health care provider. Make sure you discuss any questions you have with your health care provider.   Document Released: 04/20/2004 Document Revised: 06/05/2011 Document Reviewed: 09/23/2014 Elsevier Interactive Patient Education [employer].

## 2015-03-07 NOTE — ED Notes (Signed)
Pt states that she noticed swelling to both of her legs this evening; pt states that the left is more swollen than the right; pt denies pain; pt states that she has never had swelling to her legs before; pt with boots and jeans on in triage unable to see legs

## 2015-03-07 NOTE — ED Provider Notes (Signed)
CSN: [id number]     Arrival date & time 03/07/15  0208 History  By signing my name below, I, Altamease Oiler, attest that this documentation has been prepared under the direction and in the presence of Orpah Greek, MD. Electronically Signed: Altamease Oiler, ED Scribe. 03/07/2015. 2:28 AM   Chief Complaint  Patient presents with  . Leg Swelling   The history is provided by the patient. No language interpreter was used.  Andrea Best is a 44 y.o. female with history of lupus and metastatic gastric cancer on chemotherapy who presents to the Emergency Department complaining of new atraumatic BLE swelling (L>R) with onset approximately 2.5 hours ago. Pt denies LE pain, chest pain, and SOB. She denies new activities in recent days and history of CHF.   Past Medical History  Diagnosis Date  . Eczema   . Lupus (HCC)     joint pain and extreme fatique - improved after plaquenil and prednisone therapy  . Sjogren's disease ([place])     caused pt to loose her teeth due to dry mouth;  prone to eye irritation and infection due to dry eye; affects immune system - PRONE TO INFECTIONS   . Allergy   . Tachycardia   . Protein-calorie malnutrition, severe ([place])   . Abnormal findings on esophagogastroduodenoscopy (EGD) 09/22/13    esophagitis  . Asthma     no recent flare ups - no inhalers - as of 11/19/13  . GERD (gastroesophageal reflux disease)   . Anemia     iron deficiency   . Pneumonia     health care -associated July 2015 post op gastrectomy surgery  . Gastric cancer ([place]) 09/22/13 &10/10/13    adenocarcinoma/metastatic  FIRST CHEMO WAS 11/14/13 - NEXT CHEMO TREATMENT IS 11/21/13.   Past Surgical History  Procedure Laterality Date  . Cesarean section      1997  . Laparotomy N/A 10/10/2013    Procedure: SUBTOTAL GASTRECTOMY WITH GASTRIC JEJUNOSOTMY;  Surgeon: Edward Jolly, MD;  Location: [facility];  Service: General;  Laterality: N/A;  . Portacath placement Right 11/20/2013     Procedure: INSERTION PORT-A-CATH with ULTRA SOUND;  Surgeon: Adin Hector, MD;  Location: [facility];  Service: General;  Laterality: Right;  . Cystoscopy w/ ureteral stent placement Right 02/01/2015    Procedure: CYSTOSCOPY WITH RETROGRADE PYELOGRAM/URETERAL STENT PLACEMENT;  Surgeon: Festus Aloe, MD;  Location: [facility];  Service: Urology;  Laterality: Right;   Family History  Problem Relation Age of Onset  . Hypertension Mother   . Diabetes Mother   . Diabetes Maternal Grandmother   . Diabetes Maternal Grandfather   . HIV/AIDS Maternal Uncle   . Stomach cancer Other     MGMs sister   Social History  Substance Use Topics  . Smoking status: Never Smoker   . Smokeless tobacco: Never Used  . Alcohol Use: No     Comment: very occasional- none since feb 2015 when pt got sick   OB History    Gravida Para Term Preterm AB TAB SAB Ectopic Multiple Living   1 1  1             Obstetric Comments   27 weeks, NICU stay. C-section secondary to Pre-E     Review of Systems  Respiratory: Negative for shortness of breath.   Cardiovascular: Positive for leg swelling. Negative for chest pain.  All other systems reviewed and are negative.  Allergies  Compazine; Azithromycin; Sulfa antibiotics; Hydrocodone-acetaminophen; and Other  Home  Medications   Prior to Admission medications   Medication Sig Start Date End Date Taking? Authorizing Provider  amoxicillin-clavulanate (AUGMENTIN) 875-125 MG tablet Take 1 tablet by mouth 2 (two) times daily. For 30days 03/04/15  Yes Truman Hayward, MD  Artificial Tear Ointment (DRY EYES OP) Apply 2 drops to eye daily as needed (dry eyes).   Yes Historical Provider, MD  ferrous sulfate 325 (65 FE) MG tablet Take 325 mg by mouth daily with breakfast.   Yes Historical Provider, MD  hydroxychloroquine (PLAQUENIL) 200 MG tablet Take 1 tablet (200 mg total) by mouth 2 (two) times daily. 02/12/15  Yes Ladell Pier, MD  morphine (MS CONTIN) 30 MG 12 hr  tablet Take 1 tablet (30 mg total) by mouth every 12 (twelve) hours. 03/03/15  Yes Susanne Borders, NP  morphine (ROXANOL) 20 MG/ML concentrated solution Take 1/2 ml to 43ml every 4 hours as need for pain 02/25/15  Yes Ladell Pier, MD  ondansetron (ZOFRAN) 4 MG tablet Take 1 tablet (4 mg total) by mouth every 8 (eight) hours as needed for nausea or vomiting. 12/02/14  Yes Owens Shark, NP  Phenyleph-Doxylamine-DM-APAP (ALKA SELTZER PLUS PO) Take 1 capsule by mouth 2 (two) times daily as needed (cold symptoms).   Yes Historical Provider, MD  diphenoxylate-atropine (LOMOTIL) 2.5-0.025 MG tablet Take 1-2 tablets by mouth 4 (four) times daily as needed for diarrhea or loose stools. Up to 8 per day Patient not taking: Reported on 03/07/2015 02/16/15   Ladell Pier, MD  feeding supplement (BOOST / RESOURCE BREEZE) LIQD Take 1 Container by mouth 2 (two) times daily between meals. Patient not taking: Reported on 03/07/2015 01/11/15   Hosie Poisson, MD  LORazepam (ATIVAN) 0.5 MG tablet Take 1 tablet (0.5 mg total) by mouth every 8 (eight) hours as needed for sleep (nausea). Patient not taking: Reported on 03/07/2015 01/20/15   Ladell Pier, MD  potassium chloride SA (K-DUR,KLOR-CON) 20 MEQ tablet Take 1 tab twice a day for 3 days then take 1 tab daily Patient not taking: Reported on 03/07/2015 02/09/15   Owens Shark, NP  saccharomyces boulardii (FLORASTOR) 250 MG capsule Take 2 capsules (500 mg total) by mouth 2 (two) times daily. Patient not taking: Reported on 03/07/2015 02/12/15   Ladell Pier, MD   BP 122/74 mmHg  Pulse 128  Temp(Src) 98.7 F (37.1 C) (Oral)  Resp 20  SpO2 97%  LMP 10/27/2014 (Approximate) Physical Exam  Constitutional: She is oriented to person, place, and time. She appears well-developed and well-nourished. No distress.  HENT:  Head: Normocephalic and atraumatic.  Right Ear: Hearing normal.  Left Ear: Hearing normal.  Nose: Nose normal.  Mouth/Throat: Oropharynx  is clear and moist and mucous membranes are normal.  Eyes: Conjunctivae and EOM are normal. Pupils are equal, round, and reactive to light.  Neck: Normal range of motion. Neck supple.  Cardiovascular: Regular rhythm, S1 normal and S2 normal.  Exam reveals no gallop and no friction rub.   No murmur heard. Pulmonary/Chest: Effort normal and breath sounds normal. No respiratory distress. She exhibits no tenderness.  Abdominal: Soft. Normal appearance and bowel sounds are normal. There is no hepatosplenomegaly. There is no tenderness. There is no rebound, no guarding, no tenderness at McBurney's point and negative Murphy's sign. No hernia.  Musculoskeletal: Normal range of motion. She exhibits edema.  Bilateral pitting edema (L>R) with no calf tenderness, no veinous chords, negative Homan's,   Neurological: She is alert  and oriented to person, place, and time. She has normal strength. No cranial nerve deficit or sensory deficit. Coordination normal. GCS eye subscore is 4. GCS verbal subscore is 5. GCS motor subscore is 6.  Skin: Skin is warm, dry and intact. No rash noted. No cyanosis.  Psychiatric: She has a normal mood and affect. Her speech is normal and behavior is normal. Thought content normal.  Nursing note and vitals reviewed.   ED Course  Procedures (including critical care time) DIAGNOSTIC STUDIES: Oxygen Saturation is 97% on RA,  normal by my interpretation.    COORDINATION OF CARE: 2:25 AM Discussed treatment plan which includes lab work, CXR, and EKG with pt at bedside and pt agreed to plan.  Labs Review Labs Reviewed  CBC WITH DIFFERENTIAL/PLATELET - Abnormal; Notable for the following:    WBC 27.2 (*)    RBC 3.14 (*)    Hemoglobin 8.9 (*)    HCT 25.9 (*)    RDW 18.0 (*)    Neutro Abs 25.8 (*)    Monocytes Absolute 0.0 (*)    All other components within normal limits  COMPREHENSIVE METABOLIC PANEL - Abnormal; Notable for the following:    Sodium 130 (*)    Chloride 98  (*)    Calcium 8.0 (*)    Albumin 2.1 (*)    AST 70 (*)    All other components within normal limits  D-DIMER, QUANTITATIVE (NOT AT [facility]) - Abnormal; Notable for the following:    D-Dimer, Quant 4.53 (*)    All other components within normal limits  TROPONIN I  BRAIN NATRIURETIC PEPTIDE  URINALYSIS, ROUTINE W REFLEX MICROSCOPIC (NOT AT [facility])    Imaging Review Dg Chest 2 View  03/07/2015  CLINICAL DATA:  Bilateral lower extremity edema. EXAM: CHEST  2 VIEW COMPARISON:  Most recent chest CT 02/22/2015, radiographs 01/06/2015 FINDINGS: Tip of the right chest port in the distal SVC. Cardiomediastinal contours are normal. No pulmonary edema, confluent airspace disease, pleural effusion or pneumothorax. Persistent but improved atelectasis at the right lung base. The biliary drain and stent are partially included. IMPRESSION: Improving right basilar atelectasis. No new abnormality. No pulmonary edema or findings of congestive failure. Electronically Signed   By: Jeb Levering M.D.   On: 03/07/2015 02:58   I have personally reviewed and evaluated these images and lab results as part of my medical decision-making.   EKG Interpretation   Date/Time:  Sunday March 07 2015 02:53:40 EST Ventricular Rate:  100 PR Interval:  133 QRS Duration: 94 QT Interval:  382 QTC Calculation: 493 R Axis:   -27 Text Interpretation:  Sinus tachycardia Borderline left axis deviation Low  voltage, extremity leads Borderline prolonged QT interval No significant  change since last tracing Confirmed by POLLINA  MD, [place] [id number]) on  03/07/2015 3:08:22 AM      MDM   Final diagnoses:  Edema, unspecified type    Presents to the ER for evaluation of swelling of her legs. Patient reports swelling in both her legs, but the left leg is much more swollen than the right. She does have some pitting edema of both legs, but there is diffuse swelling of the left leg to the knee. Patient is actively being  treated for cancer. DVT is considered. D-dimer is markedly elevated. Will require venous duplex to rule out DVT. This is not available at night, will be given Lovenox, scheduled for study in the morning. Workup does not suggest any sign of congestive heart failure.  Patient is not experiencing any chest pain or shortness of breath. She did have mild tachycardia here in the ER, but reviewing her records this chronic for her.  Will discharge with 3 days of Lasix, elevate her legs, return to the ER for treatment if duplex is positive in morning.  I personally performed the services described in this documentation, which was scribed in my presence. The recorded information has been reviewed and is accurate.     Orpah Greek, MD 03/07/15 [phone]

## 2015-03-08 ENCOUNTER — Telehealth: Payer: Self-pay | Admitting: Nurse Practitioner

## 2015-03-08 ENCOUNTER — Telehealth: Payer: Self-pay | Admitting: *Deleted

## 2015-03-08 NOTE — Telephone Encounter (Signed)
Late entry---12/8 advised scheduler that the appt needs to either be moved back one day or started by 9am due to holiday

## 2015-03-08 NOTE — Telephone Encounter (Signed)
Patient called to let us know that she went to ED late Saturday/early Sunday b/c both legs were swollen.  They did bilateral ultrasounds and EKG and did not find any reason for the swelling.  They put her on fluids pills and potassium and requested that she follow up with Dr. Benay Spice.  Her right leg is back to normal.  Her left leg is better but still has some swelling.  Will forward this note to Dr.Sherrill/RNs.

## 2015-03-08 NOTE — Telephone Encounter (Signed)
per pof to sch pt appt-cld pt to adv of updated appt time & date of appt-pt understood

## 2015-03-08 NOTE — Telephone Encounter (Signed)
I have moved appts from 12/22 to 12/21, ok per [place] due to the holiday. I have called and gave the patient a message to call me back directly.

## 2015-03-09 ENCOUNTER — Telehealth: Payer: Self-pay | Admitting: *Deleted

## 2015-03-09 NOTE — Telephone Encounter (Signed)
Per desk RN I have moved appts back to 12/22. I have called and gave the patient new appts

## 2015-03-10 ENCOUNTER — Other Ambulatory Visit (HOSPITAL_COMMUNITY): Payer: Self-pay | Admitting: Interventional Radiology

## 2015-03-10 ENCOUNTER — Ambulatory Visit (HOSPITAL_COMMUNITY)
Admission: RE | Admit: 2015-03-10 | Discharge: 2015-03-10 | Disposition: A | Discharge status: Home / Self Care | Payer: Medicaid Other | Source: Ambulatory Visit | Attending: Interventional Radiology | Admitting: Interventional Radiology

## 2015-03-10 DIAGNOSIS — K831 Obstruction of bile duct: Secondary | ICD-10-CM | POA: Diagnosis not present

## 2015-03-10 DIAGNOSIS — K7290 Hepatic failure, unspecified without coma: Secondary | ICD-10-CM | POA: Insufficient documentation

## 2015-03-10 DIAGNOSIS — C801 Malignant (primary) neoplasm, unspecified: Secondary | ICD-10-CM

## 2015-03-10 DIAGNOSIS — Z4659 Encounter for fitting and adjustment of other gastrointestinal appliance and device: Secondary | ICD-10-CM | POA: Insufficient documentation

## 2015-03-10 DIAGNOSIS — C169 Malignant neoplasm of stomach, unspecified: Secondary | ICD-10-CM | POA: Insufficient documentation

## 2015-03-10 MED ORDER — IOHEXOL 300 MG/ML  SOLN
10.0000 mL | Freq: Once | INTRAMUSCULAR | Status: AC | PRN
  Administered 2015-03-10: 10 mL

## 2015-03-11 ENCOUNTER — Telehealth: Payer: Self-pay | Admitting: Genetic Counselor

## 2015-03-11 NOTE — Telephone Encounter (Signed)
Called to confirm whether patient wanted to pursue genetic testing.  She would like to do so and I will leave a kit in the lab for 12/22 when she comes in for other labs.

## 2015-03-12 NOTE — Progress Notes (Signed)
Pt not seen.

## 2015-03-17 ENCOUNTER — Ambulatory Visit: Payer: Medicaid Other

## 2015-03-17 ENCOUNTER — Other Ambulatory Visit: Payer: Self-pay | Admitting: *Deleted

## 2015-03-17 ENCOUNTER — Ambulatory Visit: Payer: Self-pay | Admitting: Nurse Practitioner

## 2015-03-17 ENCOUNTER — Other Ambulatory Visit: Payer: Medicaid Other

## 2015-03-17 ENCOUNTER — Ambulatory Visit: Payer: Medicaid Other | Admitting: Nurse Practitioner

## 2015-03-18 ENCOUNTER — Telehealth: Payer: Self-pay | Admitting: Oncology

## 2015-03-18 ENCOUNTER — Other Ambulatory Visit (HOSPITAL_BASED_OUTPATIENT_CLINIC_OR_DEPARTMENT_OTHER): Payer: Medicaid Other

## 2015-03-18 ENCOUNTER — Other Ambulatory Visit: Payer: 59

## 2015-03-18 ENCOUNTER — Encounter: Payer: Self-pay | Admitting: Nurse Practitioner

## 2015-03-18 ENCOUNTER — Ambulatory Visit (HOSPITAL_BASED_OUTPATIENT_CLINIC_OR_DEPARTMENT_OTHER): Payer: Medicaid Other | Admitting: Nurse Practitioner

## 2015-03-18 ENCOUNTER — Ambulatory Visit: Payer: 59 | Admitting: Nurse Practitioner

## 2015-03-18 ENCOUNTER — Ambulatory Visit (HOSPITAL_BASED_OUTPATIENT_CLINIC_OR_DEPARTMENT_OTHER): Payer: Medicaid Other

## 2015-03-18 VITALS — BP 111/65 | HR 108 | Temp 98.6°F | Resp 18 | Ht 66.0 in | Wt 128.3 lb

## 2015-03-18 DIAGNOSIS — C169 Malignant neoplasm of stomach, unspecified: Secondary | ICD-10-CM

## 2015-03-18 DIAGNOSIS — G893 Neoplasm related pain (acute) (chronic): Secondary | ICD-10-CM | POA: Diagnosis not present

## 2015-03-18 DIAGNOSIS — R634 Abnormal weight loss: Secondary | ICD-10-CM

## 2015-03-18 DIAGNOSIS — C162 Malignant neoplasm of body of stomach: Secondary | ICD-10-CM

## 2015-03-18 DIAGNOSIS — Z5111 Encounter for antineoplastic chemotherapy: Secondary | ICD-10-CM | POA: Diagnosis not present

## 2015-03-18 DIAGNOSIS — E8809 Other disorders of plasma-protein metabolism, not elsewhere classified: Secondary | ICD-10-CM | POA: Diagnosis not present

## 2015-03-18 LAB — COMPREHENSIVE METABOLIC PANEL
ALT: 15 U/L (ref 0–55)
AST: 31 U/L (ref 5–34)
Albumin: 2 g/dL — ABNORMAL LOW (ref 3.5–5.0)
Alkaline Phosphatase: 140 U/L (ref 40–150)
Anion Gap: 7 mEq/L (ref 3–11)
BILIRUBIN TOTAL: 0.3 mg/dL (ref 0.20–1.20)
BUN: 7.7 mg/dL (ref 7.0–26.0)
CO2: 26 meq/L (ref 22–29)
Calcium: 8.1 mg/dL — ABNORMAL LOW (ref 8.4–10.4)
Chloride: 104 mEq/L (ref 98–109)
Creatinine: 0.9 mg/dL (ref 0.6–1.1)
GLUCOSE: 98 mg/dL (ref 70–140)
Potassium: 3.7 mEq/L (ref 3.5–5.1)
SODIUM: 136 meq/L (ref 136–145)
TOTAL PROTEIN: 7 g/dL (ref 6.4–8.3)

## 2015-03-18 LAB — CBC WITH DIFFERENTIAL/PLATELET
BASO%: 0.8 % (ref 0.0–2.0)
Basophils Absolute: 0.1 10*3/uL (ref 0.0–0.1)
EOS%: 0.5 % (ref 0.0–7.0)
Eosinophils Absolute: 0.1 10*3/uL (ref 0.0–0.5)
HCT: 27 % — ABNORMAL LOW (ref 34.8–46.6)
HGB: 8.7 g/dL — ABNORMAL LOW (ref 11.6–15.9)
LYMPH%: 17.5 % (ref 14.0–49.7)
MCH: 26.6 pg (ref 25.1–34.0)
MCHC: 32.2 g/dL (ref 31.5–36.0)
MCV: 82.6 fL (ref 79.5–101.0)
MONO#: 1.3 10*3/uL — AB (ref 0.1–0.9)
MONO%: 10.9 % (ref 0.0–14.0)
NEUT%: 70.3 % (ref 38.4–76.8)
NEUTROS ABS: 8.4 10*3/uL — AB (ref 1.5–6.5)
Platelets: 220 10*3/uL (ref 145–400)
RBC: 3.27 10*6/uL — AB (ref 3.70–5.45)
RDW: 18 % — AB (ref 11.2–14.5)
WBC: 12 10*3/uL — AB (ref 3.9–10.3)
lymph#: 2.1 10*3/uL (ref 0.9–3.3)

## 2015-03-18 MED ORDER — PALONOSETRON HCL INJECTION 0.25 MG/5ML
INTRAVENOUS | Status: AC
  Filled 2015-03-18: qty 5

## 2015-03-18 MED ORDER — FLUOROURACIL CHEMO INJECTION 500 MG/10ML
300.0000 mg/m2 | Freq: Once | INTRAVENOUS | Status: AC
  Administered 2015-03-18: 500 mg via INTRAVENOUS
  Filled 2015-03-18: qty 10

## 2015-03-18 MED ORDER — OXALIPLATIN CHEMO INJECTION 100 MG/20ML
85.0000 mg/m2 | Freq: Once | INTRAVENOUS | Status: AC
  Administered 2015-03-18: 145 mg via INTRAVENOUS
  Filled 2015-03-18: qty 29

## 2015-03-18 MED ORDER — SODIUM CHLORIDE 0.9 % IV SOLN
Freq: Once | INTRAVENOUS | Status: AC
  Administered 2015-03-18: 10:00:00 via INTRAVENOUS
  Filled 2015-03-18: qty 5

## 2015-03-18 MED ORDER — LEUCOVORIN CALCIUM INJECTION 350 MG
300.0000 mg/m2 | Freq: Once | INTRAVENOUS | Status: AC
  Administered 2015-03-18: 510 mg via INTRAVENOUS
  Filled 2015-03-18: qty 25.5

## 2015-03-18 MED ORDER — SODIUM CHLORIDE 0.9 % IV SOLN
1800.0000 mg/m2 | INTRAVENOUS | Status: DC
  Administered 2015-03-18: 3050 mg via INTRAVENOUS
  Filled 2015-03-18: qty 61

## 2015-03-18 MED ORDER — PALONOSETRON HCL INJECTION 0.25 MG/5ML
0.2500 mg | Freq: Once | INTRAVENOUS | Status: AC
  Administered 2015-03-18: 0.25 mg via INTRAVENOUS

## 2015-03-18 MED ORDER — DEXTROSE 5 % IV SOLN
Freq: Once | INTRAVENOUS | Status: AC
  Administered 2015-03-18: 11:00:00 via INTRAVENOUS

## 2015-03-18 NOTE — Assessment & Plan Note (Signed)
She underwent a biliary stent and drain placement on 02/23/2015; has been continuing to recover from this procedure.  Patient last received her last cycle of  FOLFOX chemotherapy on 03/04/2015.  Patient reports that she tolerated her last cycle of chemotherapy fairly well with minimal side effects.  She denies any recent fevers or chills.  Blood counts obtained today were all within normal limits.    Since this weekend is a holiday-patient is requesting to have her chemotherapy pump discontinued a little early.  Patient is scheduled to return Friday afternoon, 03/19/2015 at 5 PM for her pump discontinuation.  Patient will also need to be scheduled for labs, flush, visit, and chemotherapy again on 04/01/2015.

## 2015-03-18 NOTE — Assessment & Plan Note (Signed)
Albumen was 2.0 today. Patient has lost approximately 3 pounds since her last weight check.  She confirmed that she is no longer taking any TPN whatsoever.  She has been able to both eat and drink fairly well since her last chemotherapy.  Patient was encouraged to push fluids is much as possible; and eat multiple small meals throughout the day.  She was also encouraged to push protein in her diet is much as possible.

## 2015-03-18 NOTE — Assessment & Plan Note (Signed)
Patient has lost approximately 3 pounds since her last weight check.  She confirmed that she is no longer taking any TPN whatsoever.  She has been able to both eat and drink fairly well since her last chemotherapy.  Patient was encouraged to push fluids is much as possible; and eat multiple small meals throughout the day.  She was also encouraged to push protein in her diet is much as possible.

## 2015-03-18 NOTE — Telephone Encounter (Signed)
Appointments added per pof and she will get a new schedule

## 2015-03-18 NOTE — Patient Instructions (Signed)
[facility] Discharge Instructions for Patients Receiving Chemotherapy  Today you received the following chemotherapy agents: Oxaliplatin, Leucovorin, 5FU   To help prevent nausea and vomiting after your treatment, we encourage you to take your nausea medication as prescribed.    If you develop nausea and vomiting that is not controlled by your nausea medication, call the clinic.   BELOW ARE SYMPTOMS THAT SHOULD BE REPORTED IMMEDIATELY:  *FEVER GREATER THAN 100.5 F  *CHILLS WITH OR WITHOUT FEVER  NAUSEA AND VOMITING THAT IS NOT CONTROLLED WITH YOUR NAUSEA MEDICATION  *UNUSUAL SHORTNESS OF BREATH  *UNUSUAL BRUISING OR BLEEDING  TENDERNESS IN MOUTH AND THROAT WITH OR WITHOUT PRESENCE OF ULCERS  *URINARY PROBLEMS  *BOWEL PROBLEMS  UNUSUAL RASH Items with * indicate a potential emergency and should be followed up as soon as possible.  Feel free to call the clinic you have any questions or concerns. The clinic phone number is [phone].  Please show the CHEMO ALERT CARD at check-in to the Emergency Department and triage nurse.   

## 2015-03-18 NOTE — Progress Notes (Signed)
SYMPTOM MANAGEMENT CLINIC   HPI: Andrea Best 44 y.o. female diagnosed with gastric cancer.  Currently undergoing FOLFOX chemotherapy regimen.   Patient presents back to the [facility] today to receive cycle 4 of her FOLFOX chemotherapy regimen.  She states that she has been doing fairly well since her last cycle of chemotherapy.  She is eating and drinking now; and has not been taking any TPN infusions.  She denies any recent fevers or chills. HPI  ROS  Past Medical History  Diagnosis Date  . Eczema   . Lupus (HCC)     joint pain and extreme fatique - improved after plaquenil and prednisone therapy  . Sjogren's disease ([place])     caused pt to loose her teeth due to dry mouth;  prone to eye irritation and infection due to dry eye; affects immune system - PRONE TO INFECTIONS   . Allergy   . Tachycardia   . Protein-calorie malnutrition, severe ([place])   . Abnormal findings on esophagogastroduodenoscopy (EGD) 09/22/13    esophagitis  . Asthma     no recent flare ups - no inhalers - as of 11/19/13  . GERD (gastroesophageal reflux disease)   . Anemia     iron deficiency   . Pneumonia     health care -associated July 2015 post op gastrectomy surgery  . Gastric cancer ([place]) 09/22/13 &10/10/13    adenocarcinoma/metastatic  FIRST CHEMO WAS 11/14/13 - NEXT CHEMO TREATMENT IS 11/21/13.    Past Surgical History  Procedure Laterality Date  . Cesarean section      1997  . Laparotomy N/A 10/10/2013    Procedure: SUBTOTAL GASTRECTOMY WITH GASTRIC JEJUNOSOTMY;  Surgeon: Edward Jolly, MD;  Location: [facility];  Service: General;  Laterality: N/A;  . Portacath placement Right 11/20/2013    Procedure: INSERTION PORT-A-CATH with ULTRA SOUND;  Surgeon: Adin Hector, MD;  Location: [facility];  Service: General;  Laterality: Right;  . Cystoscopy w/ ureteral stent placement Right 02/01/2015    Procedure: CYSTOSCOPY WITH RETROGRADE PYELOGRAM/URETERAL STENT PLACEMENT;  Surgeon: Festus Aloe, MD;  Location: [facility];  Service: Urology;  Laterality: Right;    has Joint pain; Stomach pain; Gastric cancer ([place]); Gastric outlet obstruction; Anemia, iron deficiency; Protein-calorie malnutrition, severe ([place]); Hypomagnesemia; Fever, unspecified; Tachycardia; Leucocytosis; HCAP (healthcare-associated pneumonia); Candida tropicalis infection; HAP (hospital-acquired pneumonia); Cancer of antrum of stomach ([place]); URI (upper respiratory infection); Hand foot syndrome; Neuropathy due to chemotherapeutic drug ([place]); Anorexia; Weight loss; Diarrhea; Hypoalbuminemia; Arterial hypotension; Biliary obstruction; Malnutrition of moderate degree; Bacteremia; E coli bacteremia; Bacteremia due to Klebsiella pneumoniae; 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    is allergic to compazine; azithromycin; sulfa antibiotics; hydrocodone-acetaminophen; and other.    Medication List       This list is accurate as of: 03/18/15  9:54 AM.  Always use your most recent med list.               ALKA SELTZER PLUS PO  Take 1 capsule by mouth 2 (two) times daily as needed (cold symptoms).     amoxicillin-clavulanate 875-125 MG tablet  Commonly known as:  AUGMENTIN  Take 1 tablet by mouth 2 (two) times daily. For 30days     DRY EYES OP  Apply 2 drops to eye daily as needed (dry eyes).     ferrous sulfate  325 (65 FE) MG tablet  Take 325 mg by mouth daily with breakfast.     furosemide 20 MG tablet  Commonly known as:  LASIX  Take 1 tablet (20 mg total) by mouth daily.     hydroxychloroquine 200 MG tablet  Commonly known as:  PLAQUENIL  Take 1 tablet (200 mg total) by mouth 2 (two) times daily.     morphine 20 MG/ML concentrated solution  Commonly known as:  ROXANOL  Take 1/2 ml to 19m every 4 hours as  need for pain     morphine 30 MG 12 hr tablet  Commonly known as:  MS CONTIN  Take 1 tablet (30 mg total) by mouth every 12 (twelve) hours.     ondansetron 4 MG tablet  Commonly known as:  ZOFRAN  Take 1 tablet (4 mg total) by mouth every 8 (eight) hours as needed for nausea or vomiting.     potassium chloride SA 20 MEQ tablet  Commonly known as:  K-DUR,KLOR-CON  Take 1 tablet (20 mEq total) by mouth daily.         PHYSICAL EXAMINATION  Oncology Vitals 03/18/2015 03/07/2015  Height 168 cm -  Weight 58.196 kg -  Weight (lbs) 128 lbs 5 oz -  BMI (kg/m2) 20.71 kg/m2 -  Temp 98.6 98.7  Pulse 108 99  Resp 18 18  SpO2 100 100  BSA (m2) 1.65 m2 -   BP Readings from Last 2 Encounters:  03/18/15 111/65  03/07/15 107/66    Physical Exam  Constitutional: She is oriented to person, place, and time and well-developed, well-nourished, and in no distress.  HENT:  Head: Normocephalic and atraumatic.  Eyes: Conjunctivae and EOM are normal. Pupils are equal, round, and reactive to light. Right eye exhibits no discharge. Left eye exhibits no discharge. No scleral icterus.  Neck: Normal range of motion. Neck supple. No JVD present. No tracheal deviation present. No thyromegaly present.  Cardiovascular: Normal heart sounds and intact distal pulses.   Tachycardia as baseline.  Pulmonary/Chest: Effort normal and breath sounds normal. No respiratory distress. She has no wheezes. She has no rales. She exhibits no tenderness.  Abdominal: Soft. Bowel sounds are normal. She exhibits no distension and no mass. There is no tenderness. There is no rebound and no guarding.  Musculoskeletal: Normal range of motion. She exhibits edema. She exhibits no tenderness.  Trace edema to bilateral lower extremities; with the left slightly larger than the right.  Patient underwent a bilateral Doppler ultrasound of her bilateral lower extremities on 03/07/2015 which was negative for DVT.  Lymphadenopathy:    She  has no cervical adenopathy.  Neurological: She is alert and oriented to person, place, and time. Gait normal.  Skin: Skin is warm and dry. No rash noted. No erythema. No pallor.  Psychiatric: Affect normal.  Nursing note and vitals reviewed.   LABORATORY DATA:. Appointment on 03/18/2015  Component Date Value Ref Range Status  . WBC 03/18/2015 12.0* 3.9 - 10.3 10e3/uL Final  . NEUT# 03/18/2015 8.4* 1.5 - 6.5 10e3/uL Final  . HGB 03/18/2015 8.7* 11.6 - 15.9 g/dL Final  . HCT 03/18/2015 27.0* 34.8 - 46.6 % Final  . Platelets 03/18/2015 220  145 - 400 10e3/uL Final  . MCV 03/18/2015 82.6  79.5 - 101.0 fL Final  . MCH 03/18/2015 26.6  25.1 - 34.0 pg Final  . MCHC 03/18/2015 32.2  31.5 - 36.0 g/dL Final  . RBC 03/18/2015 3.27* 3.70 - 5.45 10e6/uL Final  .  RDW 03/18/2015 18.0* 11.2 - 14.5 % Final  . lymph# 03/18/2015 2.1  0.9 - 3.3 10e3/uL Final  . MONO# 03/18/2015 1.3* 0.1 - 0.9 10e3/uL Final  . Eosinophils Absolute 03/18/2015 0.1  0.0 - 0.5 10e3/uL Final  . Basophils Absolute 03/18/2015 0.1  0.0 - 0.1 10e3/uL Final  . NEUT% 03/18/2015 70.3  38.4 - 76.8 % Final  . LYMPH% 03/18/2015 17.5  14.0 - 49.7 % Final  . MONO% 03/18/2015 10.9  0.0 - 14.0 % Final  . EOS% 03/18/2015 0.5  0.0 - 7.0 % Final  . BASO% 03/18/2015 0.8  0.0 - 2.0 % Final  . Sodium 03/18/2015 136  136 - 145 mEq/L Final  . Potassium 03/18/2015 3.7  3.5 - 5.1 mEq/L Final  . Chloride 03/18/2015 104  98 - 109 mEq/L Final  . CO2 03/18/2015 26  22 - 29 mEq/L Final  . Glucose 03/18/2015 98  70 - 140 mg/dl Final   Glucose reference range is for nonfasting patients. Fasting glucose reference range is 70- 100.  Marland Kitchen BUN 03/18/2015 7.7  7.0 - 26.0 mg/dL Final  . Creatinine 03/18/2015 0.9  0.6 - 1.1 mg/dL Final  . Total Bilirubin 03/18/2015 0.30  0.20 - 1.20 mg/dL Final  . Alkaline Phosphatase 03/18/2015 140  40 - 150 U/L Final  . AST 03/18/2015 31  5 - 34 U/L Final  . ALT 03/18/2015 15  0 - 55 U/L Final  . Total Protein 03/18/2015  7.0  6.4 - 8.3 g/dL Final  . Albumin 03/18/2015 2.0* 3.5 - 5.0 g/dL Final  . Calcium 03/18/2015 8.1* 8.4 - 10.4 mg/dL Final  . Anion Gap 03/18/2015 7  3 - 11 mEq/L Final  . EGFR 03/18/2015 >90  >90 ml/min/1.73 m2 Final   eGFR is calculated using the CKD-EPI Creatinine Equation (2009)     RADIOGRAPHIC STUDIES: No results found.  ASSESSMENT/PLAN:    Weight loss Patient has lost approximately 3 pounds since her last weight check.  She confirmed that she is no longer taking any TPN whatsoever.  She has been able to both eat and drink fairly well since her last chemotherapy.  Patient was encouraged to push fluids is much as possible; and eat multiple small meals throughout the day.  She was also encouraged to push protein in her diet is much as possible.  Hypoalbuminemia Albumen was 2.0 today. Patient has lost approximately 3 pounds since her last weight check.  She confirmed that she is no longer taking any TPN whatsoever.  She has been able to both eat and drink fairly well since her last chemotherapy.  Patient was encouraged to push fluids is much as possible; and eat multiple small meals throughout the day.  She was also encouraged to push protein in her diet is much as possible.  Gastric cancer ([place]) She underwent a biliary stent and drain placement on 02/23/2015; has been continuing to recover from this procedure.  Patient last received her last cycle of  FOLFOX chemotherapy on 03/04/2015.  Patient reports that she tolerated her last cycle of chemotherapy fairly well with minimal side effects.  She denies any recent fevers or chills.  Blood counts obtained today were all within normal limits.    Since this weekend is a holiday-patient is requesting to have her chemotherapy pump discontinued a little early.  Patient is scheduled to return Friday afternoon, 03/19/2015 at 5 PM for her pump discontinuation.  Patient will also need to be scheduled for labs, flush, visit, and  chemotherapy  again on 04/01/2015.    Cancer associated pain Patient states that her pain is fairly well under control.  She is taking MS Contin extended release twice daily and using Roxanol drops for breakthrough pain.  She denies need for any refills at this time.   Patient stated understanding of all instructions; and was in agreement with this plan of care. The patient knows to call the clinic with any problems, questions or concerns.   This was a shared visit with Dr. Benay Spice today.  Total time spent with patient was 25 minutes;  with greater than 75 percent of that time spent in face to face counseling regarding patient's symptoms,  and coordination of care and follow up.  Disclaimer:This dictation was prepared with Dragon/digital dictation along with [employer]. Any transcriptional errors that result from this process are unintentional.  Drue Second, NP 03/18/2015  This was a shared visit with Drue Second. She tolerated the FOLFOX well. Her performance status has improved. The plan is to proceed with another cycle of FOLFOX today.  She underwent placement of a biliary stent on 02/23/2015. The external drain was capped and then removed on 03/10/2015. The biliary stent appears to be functioning well.  Julieanne Manson, M.D.

## 2015-03-18 NOTE — Assessment & Plan Note (Signed)
Patient states that her pain is fairly well under control.  She is taking MS Contin extended release twice daily and using Roxanol drops for breakthrough pain.  She denies need for any refills at this time.

## 2015-03-19 ENCOUNTER — Ambulatory Visit (HOSPITAL_BASED_OUTPATIENT_CLINIC_OR_DEPARTMENT_OTHER): Payer: Medicaid Other

## 2015-03-19 VITALS — BP 111/71 | HR 90 | Temp 98.7°F | Resp 18

## 2015-03-19 DIAGNOSIS — Z5189 Encounter for other specified aftercare: Secondary | ICD-10-CM | POA: Diagnosis not present

## 2015-03-19 DIAGNOSIS — C169 Malignant neoplasm of stomach, unspecified: Secondary | ICD-10-CM

## 2015-03-19 DIAGNOSIS — C162 Malignant neoplasm of body of stomach: Secondary | ICD-10-CM | POA: Diagnosis present

## 2015-03-19 MED ORDER — SODIUM CHLORIDE 0.9 % IJ SOLN
10.0000 mL | INTRAMUSCULAR | Status: DC | PRN
  Administered 2015-03-19: 10 mL
  Filled 2015-03-19: qty 10

## 2015-03-19 MED ORDER — PEGFILGRASTIM INJECTION 6 MG/0.6ML ~~LOC~~
6.0000 mg | PREFILLED_SYRINGE | Freq: Once | SUBCUTANEOUS | Status: AC
  Administered 2015-03-19: 6 mg via SUBCUTANEOUS

## 2015-03-19 MED ORDER — HEPARIN SOD (PORK) LOCK FLUSH 100 UNIT/ML IV SOLN
500.0000 [IU] | Freq: Once | INTRAVENOUS | Status: AC | PRN
  Administered 2015-03-19: 500 [IU]
  Filled 2015-03-19: qty 5

## 2015-03-19 NOTE — Patient Instructions (Signed)
Pegfilgrastim injection What is this medicine? PEGFILGRASTIM (PEG fil gra stim) is a long-acting granulocyte colony-stimulating factor that stimulates the growth of neutrophils, a type of white blood cell important in the body's fight against infection. It is used to reduce the incidence of fever and infection in patients with certain types of cancer who are receiving chemotherapy that affects the bone marrow, and to increase survival after being exposed to high doses of radiation. This medicine may be used for other purposes; ask your health care provider or pharmacist if you have questions. What should I tell my health care provider before I take this medicine? They need to know if you have any of these conditions: -kidney disease -latex allergy -ongoing radiation therapy -sickle cell disease -skin reactions to acrylic adhesives (On-Body Injector only) -an unusual or allergic reaction to pegfilgrastim, filgrastim, other medicines, foods, dyes, or preservatives -pregnant or trying to get pregnant -breast-feeding How should I use this medicine? This medicine is for injection under the skin. If you get this medicine at home, you will be taught how to prepare and give the pre-filled syringe or how to use the On-body Injector. Refer to the patient Instructions for Use for detailed instructions. Use exactly as directed. Take your medicine at regular intervals. Do not take your medicine more often than directed. It is important that you put your used needles and syringes in a special sharps container. Do not put them in a trash can. If you do not have a sharps container, call your pharmacist or healthcare provider to get one. Talk to your pediatrician regarding the use of this medicine in children. While this drug may be prescribed for selected conditions, precautions do apply. Overdosage: If you think you have taken too much of this medicine contact a poison control center or emergency room at  once. NOTE: This medicine is only for you. Do not share this medicine with others. What if I miss a dose? It is important not to miss your dose. Call your doctor or health care professional if you miss your dose. If you miss a dose due to an On-body Injector failure or leakage, a new dose should be administered as soon as possible using a single prefilled syringe for manual use. What may interact with this medicine? Interactions have not been studied. Give your health care provider a list of all the medicines, herbs, non-prescription drugs, or dietary supplements you use. Also tell them if you smoke, drink alcohol, or use illegal drugs. Some items may interact with your medicine. This list may not describe all possible interactions. Give your health care provider a list of all the medicines, herbs, non-prescription drugs, or dietary supplements you use. Also tell them if you smoke, drink alcohol, or use illegal drugs. Some items may interact with your medicine. What should I watch for while using this medicine? You may need blood work done while you are taking this medicine. If you are going to need a MRI, CT scan, or other procedure, tell your doctor that you are using this medicine (On-Body Injector only). What side effects may I notice from receiving this medicine? Side effects that you should report to your doctor or health care professional as soon as possible: -allergic reactions like skin rash, itching or hives, swelling of the face, lips, or tongue -dizziness -fever -pain, redness, or irritation at site where injected -pinpoint red spots on the skin -red or dark-brown urine -shortness of breath or breathing problems -stomach or side pain, or pain   at the shoulder -swelling -tiredness -trouble passing urine or change in the amount of urine Side effects that usually do not require medical attention (report to your doctor or health care professional if they continue or are  bothersome): -bone pain -muscle pain This list may not describe all possible side effects. Call your doctor for medical advice about side effects. You may report side effects to FDA at 1-800-FDA-1088. Where should I keep my medicine? Keep out of the reach of children. Store pre-filled syringes in a refrigerator between 2 and 8 degrees C (36 and 46 degrees F). Do not freeze. Keep in carton to protect from light. Throw away this medicine if it is left out of the refrigerator for more than 48 hours. Throw away any unused medicine after the expiration date. NOTE: This sheet is a summary. It may not cover all possible information. If you have questions about this medicine, talk to your doctor, pharmacist, or health care provider.    2016, Elsevier/Gold Standard. (2014-04-02 14:30:14)  

## 2015-03-19 NOTE — Progress Notes (Signed)
Pt presented for early pump d/c as scheduled per Dr. Benay Spice.  57.71mL remaining at time of d/c.  Pt received Neulasta injection and has no complaints at time of discharge.  VSS and pt discharged ambulatory accompanied by her children.

## 2015-03-29 ENCOUNTER — Other Ambulatory Visit: Payer: Self-pay | Admitting: Oncology

## 2015-03-31 ENCOUNTER — Other Ambulatory Visit: Payer: Self-pay | Admitting: *Deleted

## 2015-03-31 ENCOUNTER — Encounter: Payer: Self-pay | Admitting: Oncology

## 2015-03-31 DIAGNOSIS — C163 Malignant neoplasm of pyloric antrum: Secondary | ICD-10-CM

## 2015-03-31 NOTE — Progress Notes (Signed)
I placed nctracks form on desk for dr. Benay Spice for prior auth for morphine

## 2015-04-01 ENCOUNTER — Telehealth: Payer: Self-pay | Admitting: *Deleted

## 2015-04-01 ENCOUNTER — Other Ambulatory Visit (HOSPITAL_BASED_OUTPATIENT_CLINIC_OR_DEPARTMENT_OTHER): Payer: Medicaid Other

## 2015-04-01 ENCOUNTER — Ambulatory Visit (HOSPITAL_BASED_OUTPATIENT_CLINIC_OR_DEPARTMENT_OTHER): Payer: Medicaid Other

## 2015-04-01 ENCOUNTER — Telehealth: Payer: Self-pay | Admitting: Nurse Practitioner

## 2015-04-01 ENCOUNTER — Ambulatory Visit (HOSPITAL_BASED_OUTPATIENT_CLINIC_OR_DEPARTMENT_OTHER): Payer: Medicaid Other | Admitting: Nurse Practitioner

## 2015-04-01 ENCOUNTER — Encounter: Payer: Self-pay | Admitting: Nurse Practitioner

## 2015-04-01 VITALS — HR 100

## 2015-04-01 VITALS — BP 109/68 | HR 96 | Temp 98.2°F | Resp 18 | Ht 66.0 in | Wt 128.4 lb

## 2015-04-01 DIAGNOSIS — R109 Unspecified abdominal pain: Secondary | ICD-10-CM

## 2015-04-01 DIAGNOSIS — Z5111 Encounter for antineoplastic chemotherapy: Secondary | ICD-10-CM

## 2015-04-01 DIAGNOSIS — C163 Malignant neoplasm of pyloric antrum: Secondary | ICD-10-CM

## 2015-04-01 DIAGNOSIS — Z95828 Presence of other vascular implants and grafts: Secondary | ICD-10-CM

## 2015-04-01 DIAGNOSIS — C169 Malignant neoplasm of stomach, unspecified: Secondary | ICD-10-CM

## 2015-04-01 LAB — CBC WITH DIFFERENTIAL/PLATELET
BASO%: 0.9 % (ref 0.0–2.0)
Basophils Absolute: 0.2 10*3/uL — ABNORMAL HIGH (ref 0.0–0.1)
EOS%: 0.1 % (ref 0.0–7.0)
Eosinophils Absolute: 0 10*3/uL (ref 0.0–0.5)
HEMATOCRIT: 28.4 % — AB (ref 34.8–46.6)
HGB: 9.3 g/dL — ABNORMAL LOW (ref 11.6–15.9)
LYMPH#: 2.4 10*3/uL (ref 0.9–3.3)
LYMPH%: 14 % (ref 14.0–49.7)
MCH: 26.9 pg (ref 25.1–34.0)
MCHC: 32.7 g/dL (ref 31.5–36.0)
MCV: 82.1 fL (ref 79.5–101.0)
MONO#: 2.5 10*3/uL — AB (ref 0.1–0.9)
MONO%: 14.2 % — ABNORMAL HIGH (ref 0.0–14.0)
NEUT%: 70.8 % (ref 38.4–76.8)
NEUTROS ABS: 12.2 10*3/uL — AB (ref 1.5–6.5)
Platelets: 120 10*3/uL — ABNORMAL LOW (ref 145–400)
RBC: 3.46 10*6/uL — ABNORMAL LOW (ref 3.70–5.45)
RDW: 19.1 % — ABNORMAL HIGH (ref 11.2–14.5)
WBC: 17.3 10*3/uL — AB (ref 3.9–10.3)
nRBC: 0 % (ref 0–0)

## 2015-04-01 LAB — TECHNOLOGIST REVIEW

## 2015-04-01 LAB — COMPREHENSIVE METABOLIC PANEL
ALBUMIN: 2.2 g/dL — AB (ref 3.5–5.0)
ALK PHOS: 244 U/L — AB (ref 40–150)
ALT: 21 U/L (ref 0–55)
ANION GAP: 8 meq/L (ref 3–11)
AST: 49 U/L — ABNORMAL HIGH (ref 5–34)
BILIRUBIN TOTAL: 0.32 mg/dL (ref 0.20–1.20)
BUN: 8.3 mg/dL (ref 7.0–26.0)
CALCIUM: 8.2 mg/dL — AB (ref 8.4–10.4)
CO2: 22 mEq/L (ref 22–29)
CREATININE: 0.8 mg/dL (ref 0.6–1.1)
Chloride: 107 mEq/L (ref 98–109)
Glucose: 111 mg/dl (ref 70–140)
Potassium: 3.8 mEq/L (ref 3.5–5.1)
Sodium: 137 mEq/L (ref 136–145)
TOTAL PROTEIN: 7.3 g/dL (ref 6.4–8.3)

## 2015-04-01 MED ORDER — SODIUM CHLORIDE 0.9 % IV SOLN
1800.0000 mg/m2 | INTRAVENOUS | Status: DC
  Administered 2015-04-01: 3050 mg via INTRAVENOUS
  Filled 2015-04-01: qty 61

## 2015-04-01 MED ORDER — FOSAPREPITANT DIMEGLUMINE INJECTION 150 MG
Freq: Once | INTRAVENOUS | Status: AC
  Administered 2015-04-01: 12:00:00 via INTRAVENOUS
  Filled 2015-04-01: qty 5

## 2015-04-01 MED ORDER — SODIUM CHLORIDE 0.9 % IJ SOLN
10.0000 mL | INTRAMUSCULAR | Status: DC | PRN
  Administered 2015-04-01: 10 mL via INTRAVENOUS
  Filled 2015-04-01: qty 10

## 2015-04-01 MED ORDER — PALONOSETRON HCL INJECTION 0.25 MG/5ML
0.2500 mg | Freq: Once | INTRAVENOUS | Status: AC
  Administered 2015-04-01: 0.25 mg via INTRAVENOUS

## 2015-04-01 MED ORDER — LEUCOVORIN CALCIUM INJECTION 100 MG
20.0000 mg/m2 | Freq: Once | INTRAMUSCULAR | Status: AC
  Administered 2015-04-01: 34 mg via INTRAVENOUS
  Filled 2015-04-01: qty 1.7

## 2015-04-01 MED ORDER — FLUOROURACIL CHEMO INJECTION 500 MG/10ML
300.0000 mg/m2 | Freq: Once | INTRAVENOUS | Status: AC
  Administered 2015-04-01: 500 mg via INTRAVENOUS
  Filled 2015-04-01: qty 10

## 2015-04-01 MED ORDER — SODIUM CHLORIDE 0.9 % IV SOLN
Freq: Once | INTRAVENOUS | Status: AC
Start: 2015-04-01 — End: 2015-04-01
  Administered 2015-04-01: 12:00:00 via INTRAVENOUS

## 2015-04-01 MED ORDER — OXALIPLATIN CHEMO INJECTION 100 MG/20ML
85.0000 mg/m2 | Freq: Once | INTRAVENOUS | Status: AC
  Administered 2015-04-01: 145 mg via INTRAVENOUS
  Filled 2015-04-01: qty 20

## 2015-04-01 MED ORDER — PALONOSETRON HCL INJECTION 0.25 MG/5ML
INTRAVENOUS | Status: AC
  Filled 2015-04-01: qty 5

## 2015-04-01 MED ORDER — DEXTROSE 5 % IV SOLN
Freq: Once | INTRAVENOUS | Status: AC
  Administered 2015-04-01: 12:00:00 via INTRAVENOUS

## 2015-04-01 NOTE — Progress Notes (Addendum)
[facility] OFFICE PROGRESS NOTE   Diagnosis:  Gastric cancer  INTERVAL HISTORY:   Ms. Langelier returns as scheduled. She completed cycle 4 FOLFOX 03/18/2015. She denies nausea/vomiting. No mouth sores. No diarrhea. The cold sensitivity lasted several days. No persistent neuropathy symptoms. Right flank pain is better. She continues MS Contin with Roxanol as needed. Overall good appetite.  Objective:  Vital signs in last 24 hours:  Blood pressure 109/68, pulse 96, temperature 98.2 F (36.8 C), temperature source Oral, resp. rate 18, height 5' 6" (1.676 m), weight 128 lb 6.4 oz (58.242 kg), SpO2 98 %.    HEENT: White coating over tongue. No ulcerations. Resp: Lungs clear bilaterally. Cardio: Regular rate and rhythm. GI: Abdomen soft and nontender. No hepatomegaly. No mass. Vascular: No leg edema. Neuro: Vibratory sense mildly decreased over the fingertips per tuning fork exam.  Port-A-Cath without erythema.    Lab Results:  Lab Results  Component Value Date   WBC 17.3* 04/01/2015   HGB 9.3* 04/01/2015   HCT 28.4* 04/01/2015   MCV 82.1 04/01/2015   PLT 120* 04/01/2015   NEUTROABS 12.2* 04/01/2015    Imaging:  No results found.  Medications: I have reviewed the patient's current medications.  Assessment/Plan: 1. Gastric cancer status post upper endoscopy 09/22/2013 with findings of a partially obstructing oozing cratered gastric ulcer in the gastric antrum.  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   CT scans chest/abdomen/pelvis 10/03/2013 showed a 7 x 5 mm groundglass opacity in the superior segment of the right lower lobe; a 4.5 mm nodular density in the right upper lobe; bilateral axillary adenopathy; low density area anteriorly in the left hepatic lobe most consistent with fatty infiltration; another hypodense area within the medial segment of the left hepatic lobe; severe gastric distention; mild bilateral  inguinal adenopathy.   Subtotal gastrectomy and lymph node dissection with creation of a gastrojejunostomy 10/10/2013. No evidence of distant metastatic disease noted at the time of surgery. No evidence of liver metastases. 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 Tumor involved the serosal surface. Resection margins were negative. 10 of 17 lymph nodes were positive for metastatic adenocarcinoma. Her-2 not amplified   Cycle 1 weekly 5-FU/leucovorin 11/14/2013, dose reduced beginning with week 3 secondary to mucositis and hand/foot syndrome.   Cycle 2 weekly 5-FU/leucovorin beginning 12/19/2013.  Initiation of radiation/Xeloda 01/26/2014. She decided to discontinue further radiation/Xeloda after one fraction due to significant nausea/vomiting.  Cycle 3 weekly 5-FU/leucovorin beginning 02/13/2014  Cycle 4 weekly 5-FU/leucovorin beginning 03/18/2014  Cycle 5 weekly 5-FU/leucovorin beginning 04/28/2014  CTs 06/16/2014 with new enhancing nodular lesions along the ventral peritoneal surface  CT-guided biopsy of an anterior peritoneal lesion on 06/24/2014 confirmed poorly differentiated adenocarcinoma  CTs 09/03/2014 with an increasing confluent enhancing lesion located at the ventral peritoneal surface midline of the anterior abdomen. Lesion inseparable from the posterior aspect of the abdominal wall.  CT abdomen/pelvis 11/18/2014 with significant progression of metastatic disease with enlarging metastasis within the anterior abdominal wall, enlargement of multiple peritoneal implants, interval development of masses within the pancreatic head and caudate lobe of the liver. 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 New complex solid and cystic left adnexal lesion.  Placement of percutaneous internal/external biliary drain 11/19/2014, permanent biliary stent placed  02/23/2015  Cycle 1 FOLFOX 12/09/2014  Cycle 2 FOLFOX 12/23/2014  CT 01/06/2015 with a decrease in the anterior abdominal wall tumor  Cycle 3 FOLFOX  03/04/2015  Cycle 4 FOLFOX 03/18/2015  Cycle 5 FOLFOX 04/01/2015 2. Gastric outlet obstruction secondary to #1. Resolved. 3. Nausea/vomiting secondary to #2. Resolved. 4. Abdominal /flank pain secondary to #1 5. History of weight loss. 6. Microcytic anemia, likely iron deficiency. She received iron dextran 10/11/2013. 7. Lupus/Sjgren's. 8. Pneumonia during hospitalization July 2015.  9. Abdominal abscesses with body fluid culture 10/20/2013 showing moderate Candida tropicalis. She was discharged home on a 4 week course of fluconazole.  10. Mucositis and hand/foot syndrome following cycle 1-week #3 5-FU/leucovorin, the 5-FU and leucovorin were dose reduced. 11. 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 12. Status post LEEP 11/07/2013. 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 Pap smear 06/08/2014-negative for intraepithelial lesions or malignancy 13. Gram-negative sepsis 01/06/2015-blood cultures positive for Klebsiella/Escherichia coli, culture from biliary drainage positive for Enterobacter 14. New right hydronephrosis on CT 01/06/2015, stable on ultrasound 01/21/2015, mildly progressive right hydronephrosis secondary to any elongated obstructing mass on the CT 01/30/2015 status post placement of a right ureter stent 02/01/2015 15. Admission 01/20/2015 with fever/sepsis, one blood culture positive for Klebsiella pneumoniae, urine culture positive for a resistant Escherichia coli 16. Diarrhea-negative stool for C. difficile on 01/30/2015 and 02/09/2015, potentially related to antibiotic therapy, she will begin a probiotic and Lomotil 17. Pain at the left low neck/shoulder-CTs of the neck and chest 02/22/2015 revealed no explanation for the next/shoulder  pain      Disposition: Ms. Principato appears stable. She has completed 2 cycles of FOLFOX since it was resumed, 4 cycles overall. Her pain is better and she is tolerating the chemotherapy well. Plan to proceed with treatment today as scheduled. She will return for a follow-up visit and the next cycle of FOLFOX on 04/21/2015 (date per her request). She will contact the office in the interim with any problems.  Plan reviewed with Dr. Benay Spice.    Ned Card ANP/GNP-BC   04/01/2015  11:12 AM

## 2015-04-01 NOTE — Patient Instructions (Signed)
[facility] Discharge Instructions for Patients Receiving Chemotherapy  Today you received the following chemotherapy agents:  Oxaliplatin, Leucovorin and 5FU  To help prevent nausea and vomiting after your treatment, we encourage you to take your nausea medication as ordered per MD.   If you develop nausea and vomiting that is not controlled by your nausea medication, call the clinic.   BELOW ARE SYMPTOMS THAT SHOULD BE REPORTED IMMEDIATELY:  *FEVER GREATER THAN 100.5 F  *CHILLS WITH OR WITHOUT FEVER  NAUSEA AND VOMITING THAT IS NOT CONTROLLED WITH YOUR NAUSEA MEDICATION  *UNUSUAL SHORTNESS OF BREATH  *UNUSUAL BRUISING OR BLEEDING  TENDERNESS IN MOUTH AND THROAT WITH OR WITHOUT PRESENCE OF ULCERS  *URINARY PROBLEMS  *BOWEL PROBLEMS  UNUSUAL RASH Items with * indicate a potential emergency and should be followed up as soon as possible.  Feel free to call the clinic you have any questions or concerns. The clinic phone number is [phone].  Please show the CHEMO ALERT CARD at check-in to the Emergency Department and triage nurse.   

## 2015-04-01 NOTE — Telephone Encounter (Signed)
Per staff message and POF I have scheduled appts. Advised scheduler of appts. JMW  

## 2015-04-01 NOTE — Telephone Encounter (Signed)
per pof to sch pt appt-sent MW email to sch trmt-pt to get updated copy b4 leaving trmt room °

## 2015-04-01 NOTE — Patient Instructions (Signed)

## 2015-04-02 ENCOUNTER — Telehealth: Payer: Self-pay | Admitting: Genetic Counselor

## 2015-04-02 ENCOUNTER — Ambulatory Visit (HOSPITAL_BASED_OUTPATIENT_CLINIC_OR_DEPARTMENT_OTHER): Payer: Medicaid Other

## 2015-04-02 VITALS — BP 112/69 | HR 92 | Temp 98.2°F

## 2015-04-02 DIAGNOSIS — C169 Malignant neoplasm of stomach, unspecified: Secondary | ICD-10-CM | POA: Diagnosis not present

## 2015-04-02 MED ORDER — PEGFILGRASTIM INJECTION 6 MG/0.6ML ~~LOC~~
6.0000 mg | PREFILLED_SYRINGE | Freq: Once | SUBCUTANEOUS | Status: DC
  Filled 2015-04-02: qty 0.6

## 2015-04-02 MED ORDER — HEPARIN SOD (PORK) LOCK FLUSH 100 UNIT/ML IV SOLN
500.0000 [IU] | Freq: Once | INTRAVENOUS | Status: AC | PRN
  Administered 2015-04-02: 500 [IU]
  Filled 2015-04-02: qty 5

## 2015-04-02 MED ORDER — SODIUM CHLORIDE 0.9 % IJ SOLN
10.0000 mL | INTRAMUSCULAR | Status: DC | PRN
  Administered 2015-04-02: 10 mL
  Filled 2015-04-02: qty 10

## 2015-04-02 NOTE — Telephone Encounter (Signed)
Revealed negative genetic testing on the Gastric Cancer panel through [facility].  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

## 2015-04-05 ENCOUNTER — Telehealth: Payer: Self-pay | Admitting: Oncology

## 2015-04-05 NOTE — Telephone Encounter (Signed)
Left message for patient re appointments for 1/25 and 1/27. Schedule mailed.

## 2015-04-21 ENCOUNTER — Ambulatory Visit (HOSPITAL_BASED_OUTPATIENT_CLINIC_OR_DEPARTMENT_OTHER): Payer: BLUE CROSS/BLUE SHIELD | Admitting: Nurse Practitioner

## 2015-04-21 ENCOUNTER — Encounter: Payer: Self-pay | Admitting: Nurse Practitioner

## 2015-04-21 ENCOUNTER — Telehealth: Payer: Self-pay | Admitting: Oncology

## 2015-04-21 ENCOUNTER — Other Ambulatory Visit (HOSPITAL_BASED_OUTPATIENT_CLINIC_OR_DEPARTMENT_OTHER): Payer: BLUE CROSS/BLUE SHIELD

## 2015-04-21 ENCOUNTER — Ambulatory Visit (HOSPITAL_BASED_OUTPATIENT_CLINIC_OR_DEPARTMENT_OTHER): Payer: BLUE CROSS/BLUE SHIELD

## 2015-04-21 ENCOUNTER — Telehealth: Payer: Self-pay | Admitting: *Deleted

## 2015-04-21 ENCOUNTER — Other Ambulatory Visit: Payer: Self-pay | Admitting: Oncology

## 2015-04-21 VITALS — BP 111/66 | HR 88 | Temp 99.2°F | Resp 18 | Ht 66.0 in | Wt 128.9 lb

## 2015-04-21 DIAGNOSIS — C163 Malignant neoplasm of pyloric antrum: Secondary | ICD-10-CM

## 2015-04-21 DIAGNOSIS — Z5111 Encounter for antineoplastic chemotherapy: Secondary | ICD-10-CM

## 2015-04-21 DIAGNOSIS — R600 Localized edema: Secondary | ICD-10-CM

## 2015-04-21 DIAGNOSIS — C169 Malignant neoplasm of stomach, unspecified: Secondary | ICD-10-CM

## 2015-04-21 DIAGNOSIS — Z95828 Presence of other vascular implants and grafts: Secondary | ICD-10-CM

## 2015-04-21 LAB — CBC WITH DIFFERENTIAL/PLATELET
BASO%: 0.2 % (ref 0.0–2.0)
Basophils Absolute: 0 10*3/uL (ref 0.0–0.1)
EOS%: 0.6 % (ref 0.0–7.0)
Eosinophils Absolute: 0 10*3/uL (ref 0.0–0.5)
HEMATOCRIT: 28.5 % — AB (ref 34.8–46.6)
HEMOGLOBIN: 9.6 g/dL — AB (ref 11.6–15.9)
LYMPH#: 1.2 10*3/uL (ref 0.9–3.3)
LYMPH%: 22.2 % (ref 14.0–49.7)
MCH: 26.5 pg (ref 25.1–34.0)
MCHC: 33.7 g/dL (ref 31.5–36.0)
MCV: 78.7 fL — ABNORMAL LOW (ref 79.5–101.0)
MONO#: 1.3 10*3/uL — AB (ref 0.1–0.9)
MONO%: 24.9 % — ABNORMAL HIGH (ref 0.0–14.0)
NEUT%: 52.1 % (ref 38.4–76.8)
NEUTROS ABS: 2.8 10*3/uL (ref 1.5–6.5)
NRBC: 0 % (ref 0–0)
Platelets: 150 10*3/uL (ref 145–400)
RBC: 3.62 10*6/uL — ABNORMAL LOW (ref 3.70–5.45)
RDW: 17.5 % — AB (ref 11.2–14.5)
WBC: 5.3 10*3/uL (ref 3.9–10.3)

## 2015-04-21 LAB — COMPREHENSIVE METABOLIC PANEL
ALBUMIN: 2 g/dL — AB (ref 3.5–5.0)
ALT: 21 U/L (ref 0–55)
AST: 45 U/L — AB (ref 5–34)
Alkaline Phosphatase: 161 U/L — ABNORMAL HIGH (ref 40–150)
Anion Gap: 5 mEq/L (ref 3–11)
BUN: 8.2 mg/dL (ref 7.0–26.0)
CALCIUM: 8.1 mg/dL — AB (ref 8.4–10.4)
CHLORIDE: 108 meq/L (ref 98–109)
CO2: 24 mEq/L (ref 22–29)
CREATININE: 0.8 mg/dL (ref 0.6–1.1)
EGFR: >90 mL/min/{1.73_m2} (ref >90)
GLUCOSE: 92 mg/dL (ref 70–140)
Potassium: 3.9 mEq/L (ref 3.5–5.1)
SODIUM: 137 meq/L (ref 136–145)
Total Bilirubin: 0.49 mg/dL (ref 0.20–1.20)
Total Protein: 6.8 g/dL (ref 6.4–8.3)

## 2015-04-21 MED ORDER — MORPHINE SULFATE (CONCENTRATE) 20 MG/ML PO SOLN: ORAL | Status: DC

## 2015-04-21 MED ORDER — FLUOROURACIL CHEMO INJECTION 500 MG/10ML
300.0000 mg/m2 | Freq: Once | INTRAVENOUS | Status: AC
  Administered 2015-04-21: 500 mg via INTRAVENOUS
  Filled 2015-04-21: qty 10

## 2015-04-21 MED ORDER — LEUCOVORIN CALCIUM INJECTION 100 MG
20.0000 mg/m2 | Freq: Once | INTRAMUSCULAR | Status: AC
  Administered 2015-04-21: 34 mg via INTRAVENOUS
  Filled 2015-04-21: qty 1.7

## 2015-04-21 MED ORDER — PALONOSETRON HCL INJECTION 0.25 MG/5ML
0.2500 mg | Freq: Once | INTRAVENOUS | Status: AC
  Administered 2015-04-21: 0.25 mg via INTRAVENOUS

## 2015-04-21 MED ORDER — DEXTROSE 5 % IV SOLN
Freq: Once | INTRAVENOUS | Status: AC
  Administered 2015-04-21: 13:00:00 via INTRAVENOUS

## 2015-04-21 MED ORDER — SODIUM CHLORIDE 0.9 % IV SOLN
Freq: Once | INTRAVENOUS | Status: AC
  Administered 2015-04-21: 13:00:00 via INTRAVENOUS
  Filled 2015-04-21: qty 5

## 2015-04-21 MED ORDER — DEXTROSE 5 % IV SOLN
85.0000 mg/m2 | Freq: Once | INTRAVENOUS | Status: AC
  Administered 2015-04-21: 145 mg via INTRAVENOUS
  Filled 2015-04-21: qty 20

## 2015-04-21 MED ORDER — LORAZEPAM 2 MG/ML IJ SOLN
0.5000 mg | Freq: Once | INTRAMUSCULAR | Status: AC
  Administered 2015-04-21: 0.5 mg via INTRAVENOUS

## 2015-04-21 MED ORDER — LORAZEPAM 2 MG/ML IJ SOLN
INTRAMUSCULAR | Status: AC
  Filled 2015-04-21: qty 1

## 2015-04-21 MED ORDER — MORPHINE SULFATE ER 30 MG PO TBCR: 30.0000 mg | EXTENDED_RELEASE_TABLET | Freq: Two times a day (BID) | ORAL | Status: DC

## 2015-04-21 MED ORDER — SODIUM CHLORIDE 0.9% FLUSH
10.0000 mL | INTRAVENOUS | Status: DC | PRN
  Administered 2015-04-21: 10 mL via INTRAVENOUS
  Filled 2015-04-21: qty 10

## 2015-04-21 MED ORDER — FLUOROURACIL CHEMO INJECTION 5 GM/100ML
1800.0000 mg/m2 | INTRAVENOUS | Status: DC
  Administered 2015-04-21: 3050 mg via INTRAVENOUS
  Filled 2015-04-21: qty 61

## 2015-04-21 MED ORDER — PALONOSETRON HCL INJECTION 0.25 MG/5ML
INTRAVENOUS | Status: AC
  Filled 2015-04-21: qty 5

## 2015-04-21 NOTE — Telephone Encounter (Signed)
perp of to sch pt appt-sent MW email tot sch trmt*-pt to get updated copy b4 leaving

## 2015-04-21 NOTE — Patient Instructions (Signed)

## 2015-04-21 NOTE — Patient Instructions (Signed)
[facility] Discharge Instructions for Patients Receiving Chemotherapy  Today you received the following chemotherapy agents: Oxaliplatin, Leucovorin, 5FU. To help prevent nausea and vomiting after your treatment, we encourage you to take your nausea medication.   If you develop nausea and vomiting that is not controlled by your nausea medication, call the clinic.   BELOW ARE SYMPTOMS THAT SHOULD BE REPORTED IMMEDIATELY:  *FEVER GREATER THAN 100.5 F  *CHILLS WITH OR WITHOUT FEVER  NAUSEA AND VOMITING THAT IS NOT CONTROLLED WITH YOUR NAUSEA MEDICATION  *UNUSUAL SHORTNESS OF BREATH  *UNUSUAL BRUISING OR BLEEDING  TENDERNESS IN MOUTH AND THROAT WITH OR WITHOUT PRESENCE OF ULCERS  *URINARY PROBLEMS  *BOWEL PROBLEMS  UNUSUAL RASH Items with * indicate a potential emergency and should be followed up as soon as possible.  Feel free to call the clinic you have any questions or concerns. The clinic phone number is [phone].  Please show the CHEMO ALERT CARD at check-in to the Emergency Department and triage nurse.   

## 2015-04-21 NOTE — Telephone Encounter (Signed)
Per staff message and POF I have scheduled appts. Advised scheduler of appts. JMW  

## 2015-04-21 NOTE — Progress Notes (Signed)
[facility] OFFICE PROGRESS NOTE   Diagnosis:  Gastric cancer  INTERVAL HISTORY:   Ms. Huyett returns as scheduled. She completed cycle 5 FOLFOX 04/01/2015. She denies nausea/vomiting. No mouth sores she had loose stools for one day. She took Pepto-Bismol and Imodium. Cold sensitivity lasted 3-4 days. No persistent neuropathy symptoms. She has intermittent swelling of the left foot. No associated pain. The swelling tends to occur when she is up and active. Abdominal pain is well-controlled with MS Contin and liquid morphine as needed.  Objective:  Vital signs in last 24 hours:  Blood pressure 111/66, pulse 88, temperature 99.2 F (37.3 C), temperature source Oral, resp. rate 18, height 5' 6"  (1.676 m), weight 128 lb 14.4 oz (58.469 kg), SpO2 100 %.    HEENT: No thrush or ulcers. Resp: Lungs clear bilaterally. Cardio: Regular rate and rhythm. GI: Abdomen soft. Tender of the upper abdominal regions bilaterally. No hepatomegaly. No mass. Vascular: Trace edema left lower leg/ankle. Calves soft and nontender. Neuro: Vibratory sense mildly decreased over the fingertips per tuning fork exam.  Skin: Dry appearing. Port-A-Cath without erythema.    Lab Results:  Lab Results  Component Value Date   WBC 5.3 04/21/2015   HGB 9.6* 04/21/2015   HCT 28.5* 04/21/2015   MCV 78.7* 04/21/2015   PLT 150 04/21/2015   NEUTROABS 2.8 04/21/2015    Imaging:  No results found.  Medications: I have reviewed the patient's current medications.  Assessment/Plan: 1. Gastric cancer status post upper endoscopy 09/22/2013 with findings of a partially obstructing oozing cratered gastric ulcer in the gastric antrum.  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   CT scans chest/abdomen/pelvis 10/03/2013 showed a 7 x 5 mm groundglass opacity in the superior segment of the right lower lobe; a 4.5 mm nodular density in the right upper lobe; bilateral  axillary adenopathy; low density area anteriorly in the left hepatic lobe most consistent with fatty infiltration; another hypodense area within the medial segment of the left hepatic lobe; severe gastric distention; mild bilateral inguinal adenopathy.   Subtotal gastrectomy and lymph node dissection with creation of a gastrojejunostomy 10/10/2013. No evidence of distant metastatic disease noted at the time of surgery. No evidence of liver metastases. 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 Tumor involved the serosal surface. Resection margins were negative. 10 of 17 lymph nodes were positive for metastatic adenocarcinoma. Her-2 not amplified   Cycle 1 weekly 5-FU/leucovorin 11/14/2013, dose reduced beginning with week 3 secondary to mucositis and hand/foot syndrome.   Cycle 2 weekly 5-FU/leucovorin beginning 12/19/2013.  Initiation of radiation/Xeloda 01/26/2014. She decided to discontinue further radiation/Xeloda after one fraction due to significant nausea/vomiting.  Cycle 3 weekly 5-FU/leucovorin beginning 02/13/2014  Cycle 4 weekly 5-FU/leucovorin beginning 03/18/2014  Cycle 5 weekly 5-FU/leucovorin beginning 04/28/2014  CTs 06/16/2014 with new enhancing nodular lesions along the ventral peritoneal surface  CT-guided biopsy of an anterior peritoneal lesion on 06/24/2014 confirmed poorly differentiated adenocarcinoma  CTs 09/03/2014 with an increasing confluent enhancing lesion located at the ventral peritoneal surface midline of the anterior abdomen. Lesion inseparable from the posterior aspect of the abdominal wall.  CT abdomen/pelvis 11/18/2014 with significant progression of metastatic disease with enlarging metastasis within the anterior abdominal wall, enlargement of multiple peritoneal implants, interval development of masses within the pancreatic head and caudate lobe of the liver. 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 New complex solid and cystic left adnexal lesion.  Placement of percutaneous internal/external biliary drain 11/19/2014, permanent biliary stent placed 02/23/2015  Cycle 1 FOLFOX 12/09/2014  Cycle 2 FOLFOX 12/23/2014  CT 01/06/2015 with a decrease in the anterior abdominal wall tumor  Cycle 3 FOLFOX 03/04/2015  Cycle 4 FOLFOX 03/18/2015  Cycle 5 FOLFOX 04/01/2015  Cycle 6 FOLFOX 04/21/2015 2. Gastric outlet obstruction secondary to #1. Resolved. 3. Nausea/vomiting secondary to #2. Resolved. 4. Abdominal /flank pain secondary to #1 5. History of weight loss. 6. Microcytic anemia, likely iron deficiency. She received iron dextran 10/11/2013. 7. Lupus/Sjgren's. 8. Pneumonia during hospitalization July 2015.  9. Abdominal abscesses with body fluid culture 10/20/2013 showing moderate Candida tropicalis. She was discharged home on a 4 week course of fluconazole.  10. Mucositis and hand/foot syndrome following cycle 1-week #3 5-FU/leucovorin, the 5-FU and leucovorin were dose reduced. 11. 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 12. Status post LEEP 11/07/2013. 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 Pap smear 06/08/2014-negative for intraepithelial lesions or malignancy 13. Gram-negative sepsis 01/06/2015-blood cultures positive for Klebsiella/Escherichia coli, culture from biliary drainage positive for Enterobacter 14. New right hydronephrosis on CT 01/06/2015, stable on ultrasound 01/21/2015, mildly progressive right hydronephrosis secondary to any elongated obstructing mass on the CT 01/30/2015 status post placement of a right ureter stent 02/01/2015 15. Admission 01/20/2015 with fever/sepsis, one blood culture positive for Klebsiella pneumoniae, urine culture positive for a resistant Escherichia coli 16. Diarrhea-negative stool for C. difficile on  01/30/2015 and 02/09/2015, potentially related to antibiotic therapy, she will begin a probiotic and Lomotil 17. Pain at the left low neck/shoulder-CTs of the neck and chest 02/22/2015 revealed no explanation for the next/shoulder pain 18. Left lower leg/ankle edema. Negative venous Doppler 03/07/2015.      Disposition: Ms. Glandon appears stable. She has completed 5 cycles of FOLFOX overall, 3 cycles since resumed. Plan to proceed with cycle 6 today as scheduled. She will receive Neulasta on the day of pump discontinuation.  She has intermittent edema of the left lower leg/ankle. She had a negative venous Doppler 03/07/2015. I have a low clinical suspicion for DVT. She will contact the office with persistent edema, pain, erythema.  She will return for a follow-up visit and chemotherapy in 2 weeks. She will contact the office in the interim as outlined above or with any other problems.  She inquired about driving at today's visit. I recommended she not drive due to the pain medication she is taking.      Ned Card ANP/GNP-BC   04/21/2015  12:27 PM

## 2015-04-23 ENCOUNTER — Ambulatory Visit (HOSPITAL_BASED_OUTPATIENT_CLINIC_OR_DEPARTMENT_OTHER): Payer: BLUE CROSS/BLUE SHIELD

## 2015-04-23 VITALS — BP 110/65 | HR 117 | Temp 99.0°F

## 2015-04-23 DIAGNOSIS — C169 Malignant neoplasm of stomach, unspecified: Secondary | ICD-10-CM

## 2015-04-23 DIAGNOSIS — Z5189 Encounter for other specified aftercare: Secondary | ICD-10-CM | POA: Diagnosis not present

## 2015-04-23 MED ORDER — HEPARIN SOD (PORK) LOCK FLUSH 100 UNIT/ML IV SOLN
500.0000 [IU] | Freq: Once | INTRAVENOUS | Status: AC | PRN
  Administered 2015-04-23: 500 [IU]
  Filled 2015-04-23: qty 5

## 2015-04-23 MED ORDER — SODIUM CHLORIDE 0.9 % IJ SOLN
10.0000 mL | INTRAMUSCULAR | Status: DC | PRN
  Administered 2015-04-23: 10 mL
  Filled 2015-04-23: qty 10

## 2015-04-23 MED ORDER — PEGFILGRASTIM INJECTION 6 MG/0.6ML ~~LOC~~
6.0000 mg | PREFILLED_SYRINGE | Freq: Once | SUBCUTANEOUS | Status: AC
  Administered 2015-04-23: 6 mg via SUBCUTANEOUS
  Filled 2015-04-23: qty 0.6

## 2015-05-02 ENCOUNTER — Other Ambulatory Visit: Payer: Self-pay | Admitting: Oncology

## 2015-05-05 ENCOUNTER — Ambulatory Visit (HOSPITAL_COMMUNITY)
Admission: RE | Admit: 2015-05-05 | Discharge: 2015-05-05 | Disposition: A | Discharge status: Home / Self Care | Payer: BLUE CROSS/BLUE SHIELD | Source: Ambulatory Visit | Attending: Nurse Practitioner | Admitting: Nurse Practitioner

## 2015-05-05 ENCOUNTER — Other Ambulatory Visit (HOSPITAL_BASED_OUTPATIENT_CLINIC_OR_DEPARTMENT_OTHER): Payer: BLUE CROSS/BLUE SHIELD

## 2015-05-05 ENCOUNTER — Telehealth: Payer: Self-pay | Admitting: Nurse Practitioner

## 2015-05-05 ENCOUNTER — Ambulatory Visit: Payer: BLUE CROSS/BLUE SHIELD

## 2015-05-05 ENCOUNTER — Ambulatory Visit (HOSPITAL_BASED_OUTPATIENT_CLINIC_OR_DEPARTMENT_OTHER): Payer: BLUE CROSS/BLUE SHIELD | Admitting: Nurse Practitioner

## 2015-05-05 VITALS — BP 105/65 | HR 102 | Temp 98.3°F | Resp 18 | Wt 126.1 lb

## 2015-05-05 DIAGNOSIS — C163 Malignant neoplasm of pyloric antrum: Secondary | ICD-10-CM

## 2015-05-05 DIAGNOSIS — R112 Nausea with vomiting, unspecified: Secondary | ICD-10-CM

## 2015-05-05 DIAGNOSIS — R1110 Vomiting, unspecified: Secondary | ICD-10-CM | POA: Insufficient documentation

## 2015-05-05 DIAGNOSIS — R600 Localized edema: Secondary | ICD-10-CM | POA: Diagnosis not present

## 2015-05-05 LAB — CBC WITH DIFFERENTIAL/PLATELET
BASO%: 0.8 % (ref 0.0–2.0)
Basophils Absolute: 0.2 10*3/uL — ABNORMAL HIGH (ref 0.0–0.1)
EOS%: 0.1 % (ref 0.0–7.0)
Eosinophils Absolute: 0 10*3/uL (ref 0.0–0.5)
HCT: 31.7 % — ABNORMAL LOW (ref 34.8–46.6)
HEMOGLOBIN: 10.3 g/dL — AB (ref 11.6–15.9)
LYMPH#: 2.7 10*3/uL (ref 0.9–3.3)
LYMPH%: 13.3 % — ABNORMAL LOW (ref 14.0–49.7)
MCH: 26.2 pg (ref 25.1–34.0)
MCHC: 32.5 g/dL (ref 31.5–36.0)
MCV: 80.7 fL (ref 79.5–101.0)
MONO#: 1.8 10*3/uL — ABNORMAL HIGH (ref 0.1–0.9)
MONO%: 8.8 % (ref 0.0–14.0)
NEUT%: 77 % — ABNORMAL HIGH (ref 38.4–76.8)
NEUTROS ABS: 15.8 10*3/uL — AB (ref 1.5–6.5)
NRBC: 0 % (ref 0–0)
Platelets: 111 10*3/uL — ABNORMAL LOW (ref 145–400)
RBC: 3.93 10*6/uL (ref 3.70–5.45)
RDW: 19.1 % — AB (ref 11.2–14.5)
WBC: 20.5 10*3/uL — ABNORMAL HIGH (ref 3.9–10.3)

## 2015-05-05 LAB — COMPREHENSIVE METABOLIC PANEL
ALBUMIN: 2.2 g/dL — AB (ref 3.5–5.0)
ALT: 23 U/L (ref 0–55)
AST: 51 U/L — AB (ref 5–34)
Alkaline Phosphatase: 214 U/L — ABNORMAL HIGH (ref 40–150)
Anion Gap: 7 mEq/L (ref 3–11)
BUN: 7.7 mg/dL (ref 7.0–26.0)
CO2: 25 mEq/L (ref 22–29)
Calcium: 8.4 mg/dL (ref 8.4–10.4)
Chloride: 103 mEq/L (ref 98–109)
Creatinine: 0.8 mg/dL (ref 0.6–1.1)
EGFR: >90 mL/min/{1.73_m2} (ref >90)
GLUCOSE: 91 mg/dL (ref 70–140)
Potassium: 4.1 mEq/L (ref 3.5–5.1)
SODIUM: 136 meq/L (ref 136–145)
TOTAL PROTEIN: 7.4 g/dL (ref 6.4–8.3)
Total Bilirubin: 0.36 mg/dL (ref 0.20–1.20)

## 2015-05-05 LAB — TECHNOLOGIST REVIEW

## 2015-05-05 IMAGING — CT CT ANGIO CHEST
3 of 10 series · 15 of 37 positions shown · IV contrast (OMNIPAQUE)
Comparison: Portable chest x-ray of today's date CT scan of the
chest and abdomen and pelvis October 03, 2013

CLINICAL DATA: History of malignant gastric tumor removal now with
mid abdominal pain and tachycardia ; history of lupus and Awootar
disease

EXAM:
CT ANGIOGRAPHY CHEST
CT ABDOMEN AND PELVIS WITH CONTRAST
TECHNIQUE: Multidetector CT imaging of the chest was performed using the
standard protocol during bolus administration of intravenous
contrast. Multiplanar CT image reconstructions and MIPs were
obtained to evaluate the vascular anatomy. Multidetector CT imaging
of the abdomen and pelvis was performed using the standard protocol
during bolus administration of intravenous contrast.
CONTRAST:  100mL OMNIPAQUE IOHEXOL 350 MG/ML SOLN, 25mL OMNIPAQUE
IOHEXOL 300 MG/ML SOLN

[Series 4: pe @ 3mm · axial · 0.71mm/px · z∈[-302,-200]mm · 3 of 69 slices shown]
[im 18/69  lung]
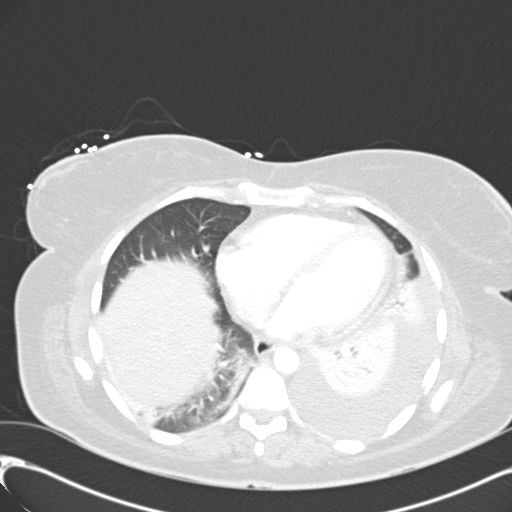
[im 35/69  lung]
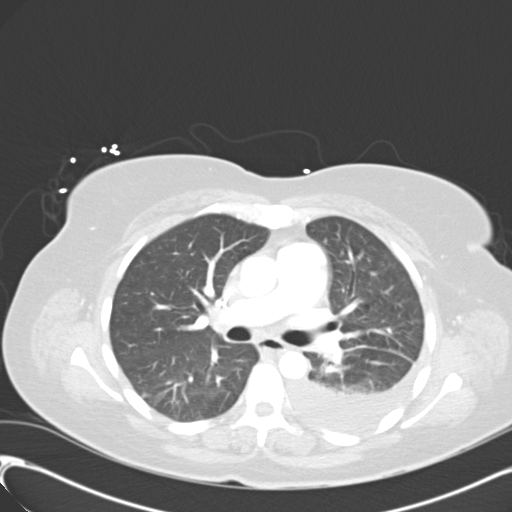
[im 52/69  lung]
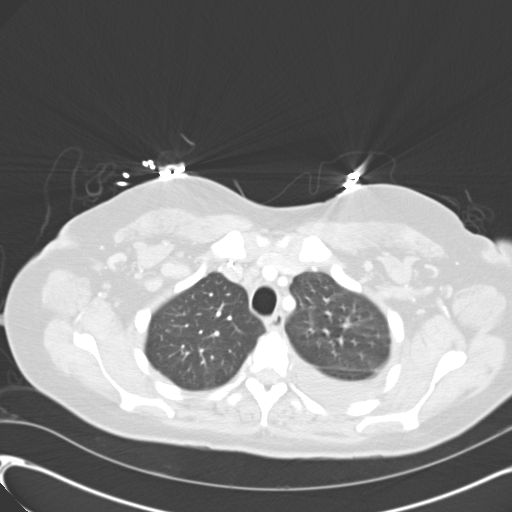

[Series 6: pe thins @ 1mm · axial · 0.71mm/px · z∈[-338,-166]mm · 8 of 205 slices shown]
[im 16/205  lung]
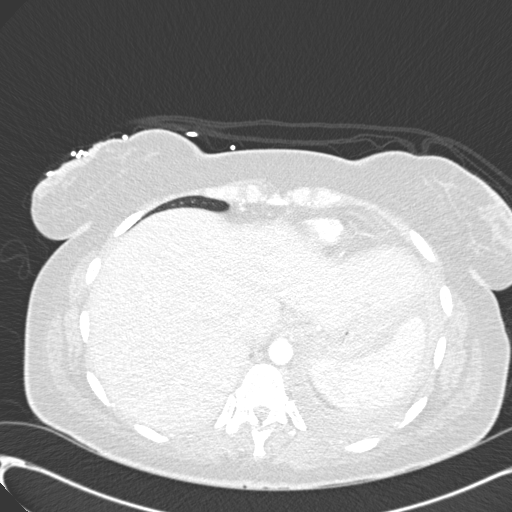
[im 48/205  lung]
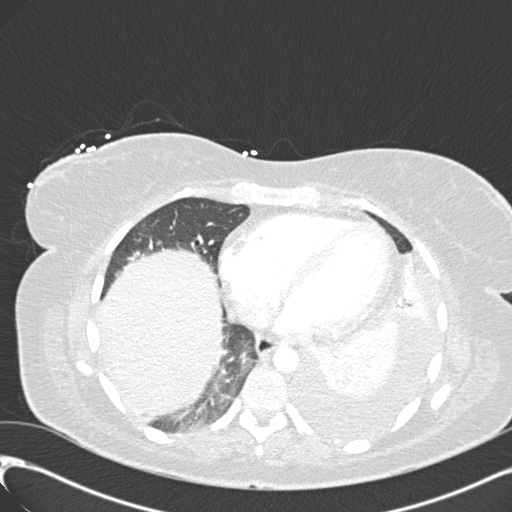
[im 63/205  lung]
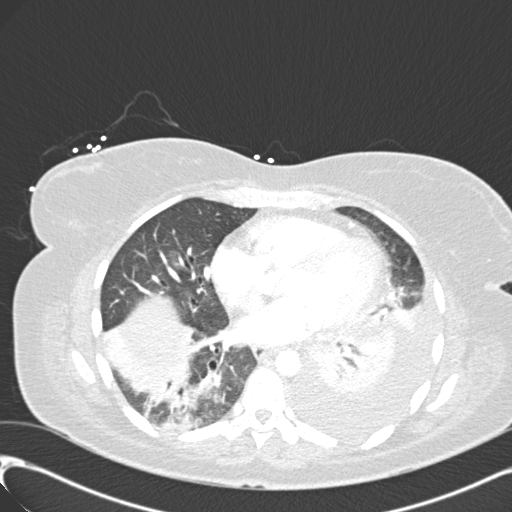
[im 95/205  lung]
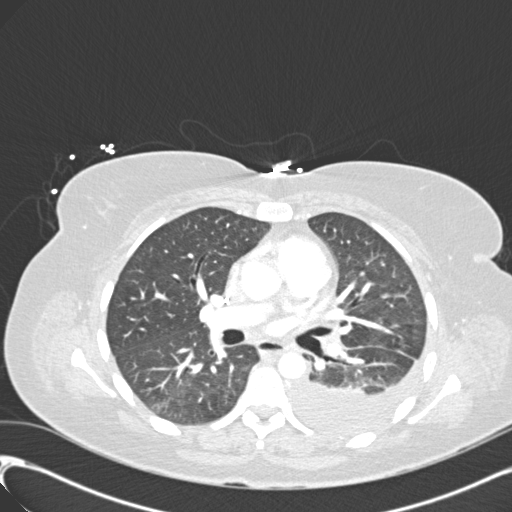
[im 110/205  lung]
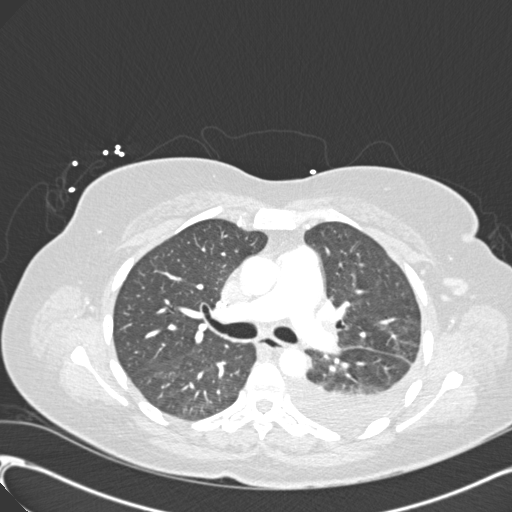
[im 142/205  lung]
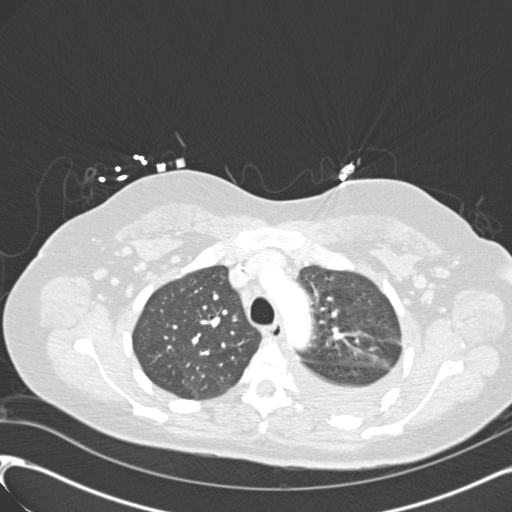
[im 157/205  lung]
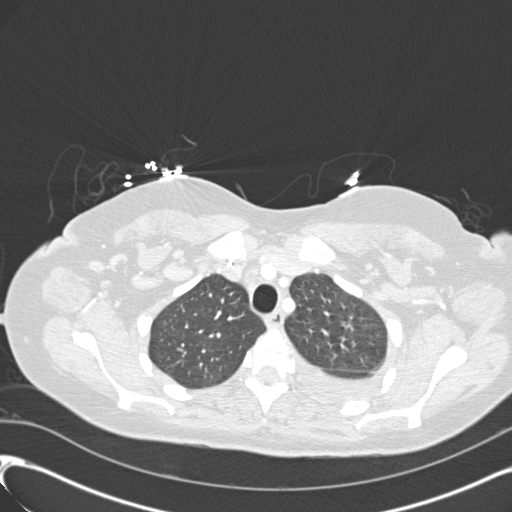
[im 189/205  lung]
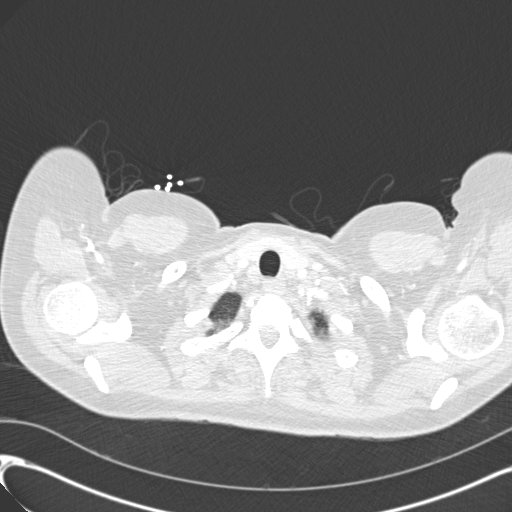

[Series 8: rtn a/p with · axial · 0.74mm/px · z∈[-648,-364]mm · 4 of 95 slices shown]
[im 19/95  lung]
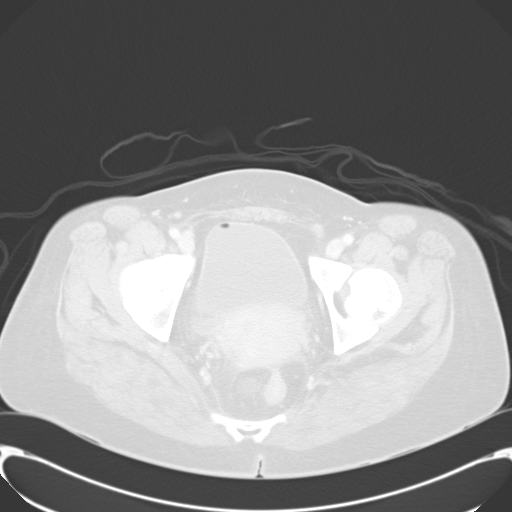
[im 38/95  mediastinal]
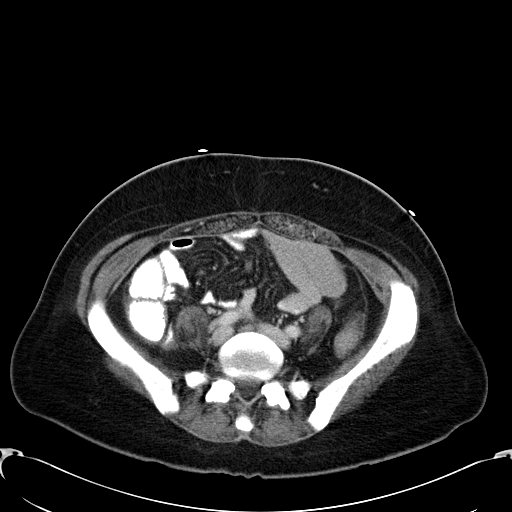
[im 57/95  lung]
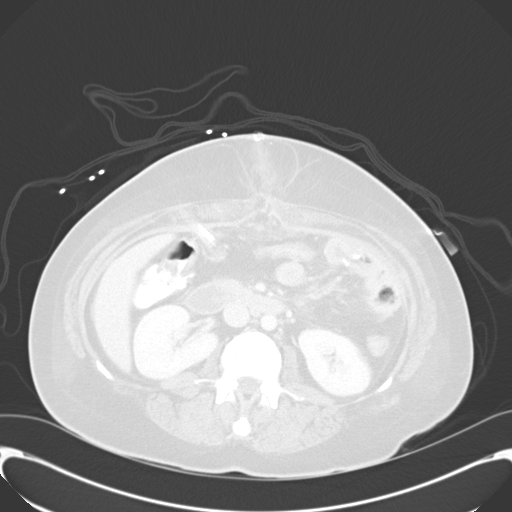
[im 76/95  mediastinal]
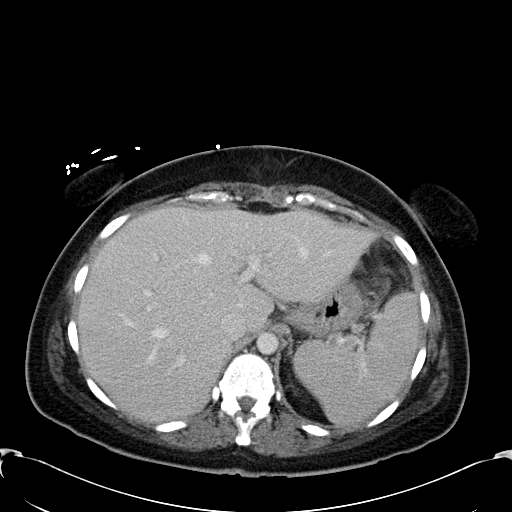

[15 of 37 positions shown; findings below may reference images not displayed]

FINDINGS: CTA CHEST FINDINGS

Contrast within the pulmonary arterial tree is normal. The caliber
of the thoracic aorta is normal. There is no false lumen. There is a
new moderate size left pleural effusion layering posteriorly. There
is atelectasis of much of the left lower lobe and atelectasis of the
posterior inferior aspect of the right lower lobe. The cardiac
chambers are top-normal in size. There is no pericardial effusion.
There is a precarinal lymph node which measures 9 mm in short axis.
There is no sub carinal, AP window, or axillary lymphadenopathy.
There is a small hiatal hernia.

The observed portions of the bony thorax are normal.

CT ABDOMEN and PELVIS FINDINGS

The patient has undergone partial resection of the left hepatic
lobe. There is a small amount of fluid at the surgical site. There
is a drainage catheter in place which enters the abdominal cavity
tip to the right of midline. The tip comes to lie superior to the
spleen adjacent to the medial aspect of the remaining portion of the
stomach. There are a few normal sized perigastric lymph nodes. There
is no bulky lymphadenopathy.

There is minimal intrahepatic ductal dilation. The gallbladder is
normal. The pancreas exhibits no inflammatory change or focal mass.
There is a thin walled, elliptical fluid collection inferior to the
tip of the spleen which measures 8.3 cm AP x 2.5 cm transversely x
4.2 cm longitudinally with HU value of +14. The kidneys and adrenal
glands are normal. The periaortic and pericaval regions are normal.

Contrast has traversed the stomach and reached as far distally as
the proximal transverse colon. There is no ileus, obstruction, or
acute inflammation. There is small amount of free pelvic fluid. The
uterus exhibits heterogeneous density and there is likely a fibroid
in the fundus measuring 3.2 cm in diameter. There is a tiny amount
of gas within the partially distended urinary bladder. There is no
inguinal hernia. There is a small amount of fluid within the midline
incision.

The lumbar spine and bony pelvis are unremarkable.

Review of the MIP images confirms the above findings.
IMPRESSION: 1. There is no acute pulmonary embolism nor acute thoracic aortic
pathology.
2. Left lower lobe atelectasis and/or pneumonia with moderate-sized
left pleural effusion new since [REDACTED]. There mild posterior
right lower lobe atelectasis.
3. There are surgical changes from the previous partial left
gastrectomy. A drainage catheter is in place in the gastric band and
no abnormal fluid collection is seen here. There is a fluid
collection inferior to the spleen compatible with a seroma or
resolved hematoma. There is a small amount of free pelvic fluid.
4. Postsurgical changes in the left hepatic lobe are noted. A small
amount of fluid is present at the surgical site but no abscess is
demonstrated.
5. There is no acute bowel abnormality nor acute hepatobiliary
abnormality. There is a small amount of fluid in the midline
incision.

## 2015-05-05 NOTE — Progress Notes (Signed)
[facility] OFFICE PROGRESS NOTE   Diagnosis:  Gastric cancer  INTERVAL HISTORY:   Andrea Best returns as scheduled. She completed cycle 6 FOLFOX 04/21/2015. She had no nausea or vomiting around the time of the most recent chemotherapy. No mouth sores. No diarrhea. Pain is well-controlled with MS Contin. She takes liquid morphine infrequently. Bowels are moving normally.  Over the past week she notes that she has nausea and vomits one time a day. The vomiting contains undigested food from hours prior. The nausea is relieved temporarily after vomiting.  She continues to have intermittent swelling of the left leg.  Objective:  Vital signs in last 24 hours:  Blood pressure 105/65, pulse 102, temperature 98.3 F (36.8 C), temperature source Oral, resp. rate 18, weight 126 lb 1.6 oz (57.199 kg), SpO2 100 %.    HEENT: No thrush or ulcers. Resp: Lungs clear bilaterally. Cardio: Regular rate and rhythm. GI: No hepatomegaly. No mass. Vascular: Pitting edema left lower leg. Calves soft and nontender. Port-A-Cath without erythema.   Lab Results:  Lab Results  Component Value Date   WBC 20.5* 05/05/2015   HGB 10.3* 05/05/2015   HCT 31.7* 05/05/2015   MCV 80.7 05/05/2015   PLT 111* 05/05/2015   NEUTROABS 15.8* 05/05/2015    Imaging:  No results found.  Medications: I have reviewed the patient's current medications.  Assessment/Plan: 1. Gastric cancer status post upper endoscopy 09/22/2013 with findings of a partially obstructing oozing cratered gastric ulcer in the gastric antrum.  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   CT scans chest/abdomen/pelvis 10/03/2013 showed a 7 x 5 mm groundglass opacity in the superior segment of the right lower lobe; a 4.5 mm nodular density in the right upper lobe; bilateral axillary adenopathy; low density area anteriorly in the left hepatic lobe most consistent with fatty  infiltration; another hypodense area within the medial segment of the left hepatic lobe; severe gastric distention; mild bilateral inguinal adenopathy.   Subtotal gastrectomy and lymph node dissection with creation of a gastrojejunostomy 10/10/2013. No evidence of distant metastatic disease noted at the time of surgery. No evidence of liver metastases. 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 Tumor involved the serosal surface. Resection margins were negative. 10 of 17 lymph nodes were positive for metastatic adenocarcinoma. Her-2 not amplified   Cycle 1 weekly 5-FU/leucovorin 11/14/2013, dose reduced beginning with week 3 secondary to mucositis and hand/foot syndrome.   Cycle 2 weekly 5-FU/leucovorin beginning 12/19/2013.  Initiation of radiation/Xeloda 01/26/2014. She decided to discontinue further radiation/Xeloda after one fraction due to significant nausea/vomiting.  Cycle 3 weekly 5-FU/leucovorin beginning 02/13/2014  Cycle 4 weekly 5-FU/leucovorin beginning 03/18/2014  Cycle 5 weekly 5-FU/leucovorin beginning 04/28/2014  CTs 06/16/2014 with new enhancing nodular lesions along the ventral peritoneal surface  CT-guided biopsy of an anterior peritoneal lesion on 06/24/2014 confirmed poorly differentiated adenocarcinoma  CTs 09/03/2014 with an increasing confluent enhancing lesion located at the ventral peritoneal surface midline of the anterior abdomen. Lesion inseparable from the posterior aspect of the abdominal wall.  CT abdomen/pelvis 11/18/2014 with significant progression of metastatic disease with enlarging metastasis within the anterior abdominal wall, enlargement of multiple peritoneal implants, interval development of masses within the pancreatic head and caudate lobe of the liver. 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 New complex solid and cystic left  adnexal lesion.  Placement of percutaneous internal/external biliary drain 11/19/2014, permanent biliary stent placed 02/23/2015  Cycle  1 FOLFOX 12/09/2014  Cycle 2 FOLFOX 12/23/2014  CT 01/06/2015 with a decrease in the anterior abdominal wall tumor  Cycle 3 FOLFOX 03/04/2015  Cycle 4 FOLFOX 03/18/2015  Cycle 5 FOLFOX 04/01/2015  Cycle 6 FOLFOX 04/21/2015 2. Gastric outlet obstruction secondary to #1. Resolved. 3. Nausea/vomiting secondary to #2. Resolved. 4. Abdominal /flank pain secondary to #1 5. History of weight loss. 6. Microcytic anemia, likely iron deficiency. She received iron dextran 10/11/2013. 7. Lupus/Sjgren's. 8. Pneumonia during hospitalization July 2015.  9. Abdominal abscesses with body fluid culture 10/20/2013 showing moderate Candida tropicalis. She was discharged home on a 4 week course of fluconazole.  10. Mucositis and hand/foot syndrome following cycle 1-week #3 5-FU/leucovorin, the 5-FU and leucovorin were dose reduced. 11. 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 12. Status post LEEP 11/07/2013. 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 Pap smear 06/08/2014-negative for intraepithelial lesions or malignancy 13. Gram-negative sepsis 01/06/2015-blood cultures positive for Klebsiella/Escherichia coli, culture from biliary drainage positive for Enterobacter 14. New right hydronephrosis on CT 01/06/2015, stable on ultrasound 01/21/2015, mildly progressive right hydronephrosis secondary to any elongated obstructing mass on the CT 01/30/2015 status post placement of a right ureter stent 02/01/2015 15. Admission 01/20/2015 with fever/sepsis, one blood culture positive for Klebsiella pneumoniae, urine culture positive for a resistant Escherichia coli 16. Diarrhea-negative stool for C. difficile on 01/30/2015 and 02/09/2015, potentially related to antibiotic therapy, she will begin a probiotic and  Lomotil 17. Pain at the left low neck/shoulder-CTs of the neck and chest 02/22/2015 revealed no explanation for the next/shoulder pain 18. Left lower leg/ankle edema. Negative venous Doppler 03/07/2015.   Disposition: Andrea Best has completed 6 cycles of FOLFOX. The original plan was to complete today's treatment prior to a restaging CT evaluation. However, she presents today with a one-week history of vomiting undigested food. We are concerned for a gastric outlet obstruction. We are holding today's treatment and referring her for an abdominal x-ray. If the x-ray shows evidence of an obstruction we will resume TPN. If there is no evidence of an obstruction we will instruct her on a liquid diet and plan to proceed with the restaging CT evaluation as scheduled 05/14/2015. She will return for a follow-up visit on 05/19/2015 to review the results.  Plan reviewed with Dr. Benay Spice. 25 minutes were spent face-to-face at today's visit with the majority of that time involved in counseling/coordination of care.  Ned Card ANP/GNP-BC   05/05/2015  11:07 AM

## 2015-05-05 NOTE — Telephone Encounter (Signed)
per pof to sch pt appt-gave pt copy of avs-gave contrast °

## 2015-05-05 NOTE — Telephone Encounter (Signed)
I notified Andrea Best the abdominal x-ray did not show any obvious signs of obstruction. She was instructed on a liquid diet. She will call the office on 05/07/2015 with an update.

## 2015-05-06 IMAGING — US US THORACENTESIS ASP PLEURAL SPACE W/IMG GUIDE
1 series · 3 of 3 positions shown · non-contrast
Comparison: None.

CLINICAL DATA: Left-sided pleural effusion. Request diagnostic and
therapeutic thoracentesis.

EXAM:
ULTRASOUND GUIDED LEFT THORACENTESIS

[Series 1: us thoracentesis asp pleural space w/img guide · 0.27mm/px · 3 of 3 slices shown]
[im 1/3]
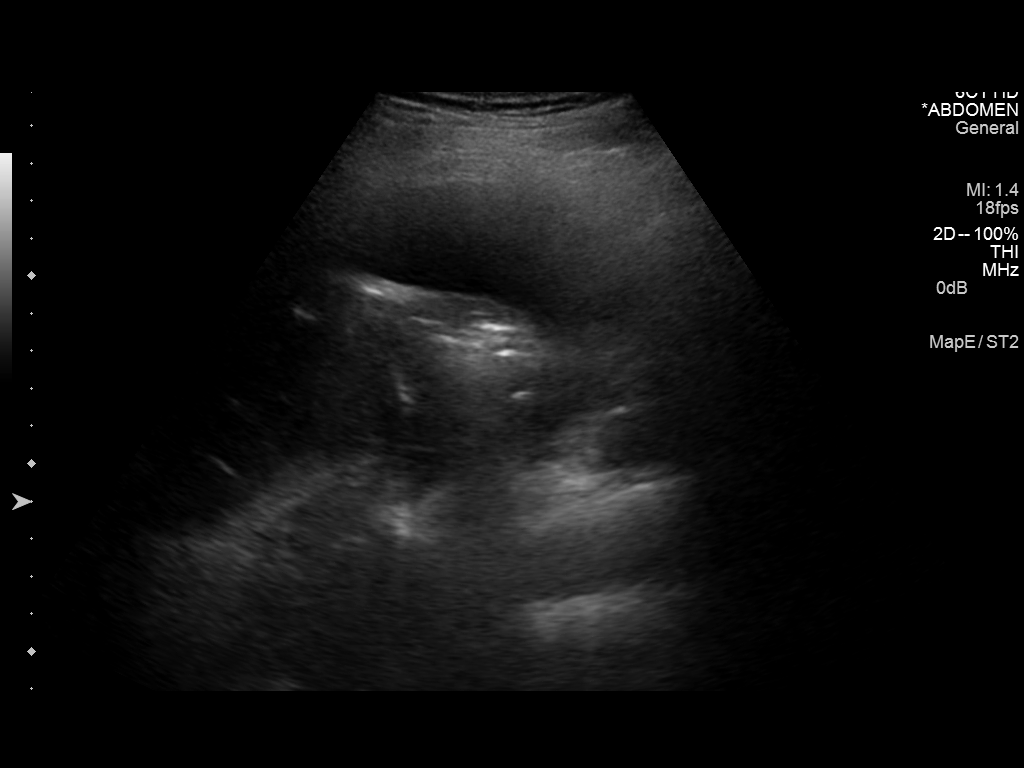
[im 2/3]
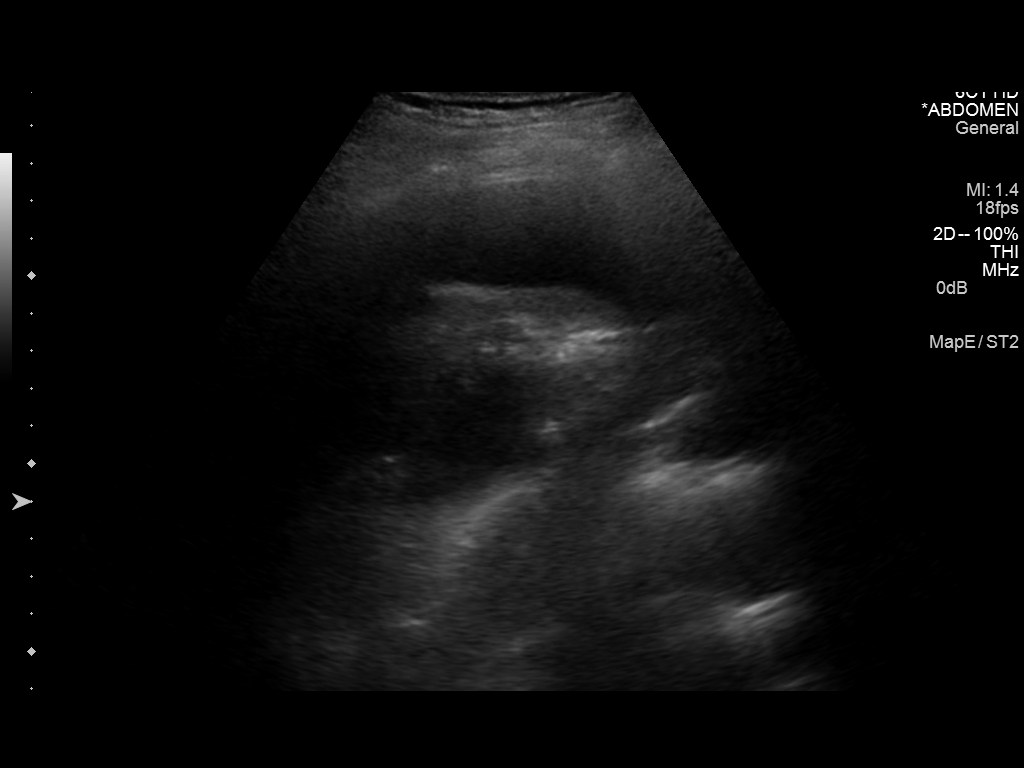
[im 3/3]
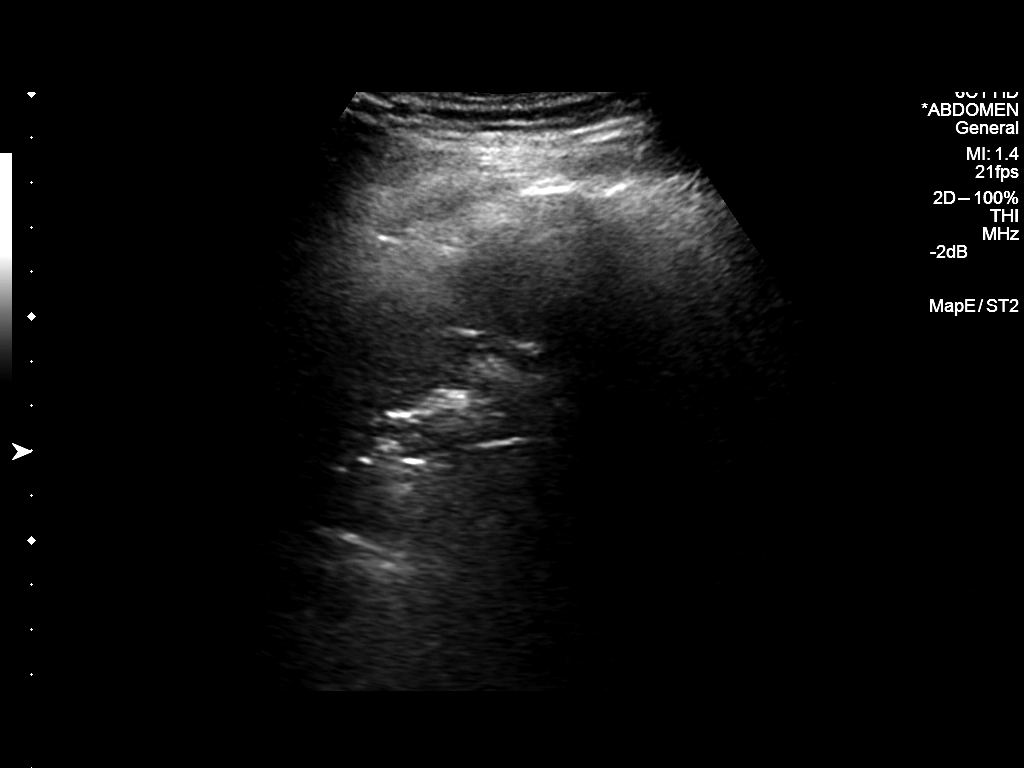

[3 of 3 positions shown; findings below may reference images not displayed]

PROCEDURE:
An ultrasound guided thoracentesis was thoroughly discussed with the
patient and questions answered. The benefits, risks, alternatives
and complications were also discussed. The patient understands and
wishes to proceed with the procedure. Written consent was obtained.

Ultrasound was performed to localize and mark an adequate pocket of
fluid in the left chest. The area was then prepped and draped in the
normal sterile fashion. 1% Lidocaine was used for local anesthesia.
Under ultrasound guidance a 19 gauge Yueh catheter was introduced.
Thoracentesis was performed. The catheter was removed and a dressing
applied.

Complications:  None immediate
FINDINGS: A total of approximately 270 mL of hazy, dark yellow fluid was
removed. A fluid sample wassent for laboratory analysis.
IMPRESSION: Successful ultrasound guided left thoracentesis yielding 270 mL of
pleural fluid.

## 2015-05-07 ENCOUNTER — Ambulatory Visit: Payer: BLUE CROSS/BLUE SHIELD

## 2015-05-14 ENCOUNTER — Ambulatory Visit (HOSPITAL_COMMUNITY)
Admission: RE | Admit: 2015-05-14 | Discharge: 2015-05-14 | Disposition: A | Discharge status: Home / Self Care | Payer: BLUE CROSS/BLUE SHIELD | Source: Ambulatory Visit | Attending: Nurse Practitioner | Admitting: Nurse Practitioner

## 2015-05-14 ENCOUNTER — Encounter (HOSPITAL_COMMUNITY): Payer: Self-pay

## 2015-05-14 DIAGNOSIS — K760 Fatty (change of) liver, not elsewhere classified: Secondary | ICD-10-CM | POA: Insufficient documentation

## 2015-05-14 DIAGNOSIS — N858 Other specified noninflammatory disorders of uterus: Secondary | ICD-10-CM | POA: Diagnosis not present

## 2015-05-14 DIAGNOSIS — R935 Abnormal findings on diagnostic imaging of other abdominal regions, including retroperitoneum: Secondary | ICD-10-CM | POA: Insufficient documentation

## 2015-05-14 DIAGNOSIS — N83202 Unspecified ovarian cyst, left side: Secondary | ICD-10-CM | POA: Diagnosis not present

## 2015-05-14 DIAGNOSIS — C163 Malignant neoplasm of pyloric antrum: Secondary | ICD-10-CM | POA: Diagnosis not present

## 2015-05-14 DIAGNOSIS — C786 Secondary malignant neoplasm of retroperitoneum and peritoneum: Secondary | ICD-10-CM | POA: Insufficient documentation

## 2015-05-14 DIAGNOSIS — N83201 Unspecified ovarian cyst, right side: Secondary | ICD-10-CM | POA: Insufficient documentation

## 2015-05-14 MED ORDER — IOHEXOL 300 MG/ML  SOLN
100.0000 mL | Freq: Once | INTRAMUSCULAR | Status: AC | PRN
  Administered 2015-05-14: 100 mL via INTRAVENOUS

## 2015-05-19 ENCOUNTER — Other Ambulatory Visit: Payer: Self-pay | Admitting: *Deleted

## 2015-05-19 ENCOUNTER — Ambulatory Visit (HOSPITAL_BASED_OUTPATIENT_CLINIC_OR_DEPARTMENT_OTHER): Payer: BLUE CROSS/BLUE SHIELD | Admitting: Oncology

## 2015-05-19 ENCOUNTER — Telehealth: Payer: Self-pay | Admitting: *Deleted

## 2015-05-19 ENCOUNTER — Ambulatory Visit: Payer: BLUE CROSS/BLUE SHIELD

## 2015-05-19 ENCOUNTER — Telehealth: Payer: Self-pay | Admitting: Oncology

## 2015-05-19 ENCOUNTER — Other Ambulatory Visit (HOSPITAL_BASED_OUTPATIENT_CLINIC_OR_DEPARTMENT_OTHER): Payer: BLUE CROSS/BLUE SHIELD

## 2015-05-19 VITALS — BP 109/67 | HR 116 | Temp 98.3°F | Resp 18 | Ht 66.0 in | Wt 134.2 lb

## 2015-05-19 DIAGNOSIS — C163 Malignant neoplasm of pyloric antrum: Secondary | ICD-10-CM | POA: Diagnosis not present

## 2015-05-19 DIAGNOSIS — C786 Secondary malignant neoplasm of retroperitoneum and peritoneum: Secondary | ICD-10-CM

## 2015-05-19 DIAGNOSIS — C7989 Secondary malignant neoplasm of other specified sites: Secondary | ICD-10-CM

## 2015-05-19 DIAGNOSIS — M25551 Pain in right hip: Secondary | ICD-10-CM

## 2015-05-19 DIAGNOSIS — R112 Nausea with vomiting, unspecified: Secondary | ICD-10-CM

## 2015-05-19 DIAGNOSIS — D509 Iron deficiency anemia, unspecified: Secondary | ICD-10-CM

## 2015-05-19 DIAGNOSIS — R609 Edema, unspecified: Secondary | ICD-10-CM

## 2015-05-19 LAB — CBC WITH DIFFERENTIAL/PLATELET
BASO%: 0.3 % (ref 0.0–2.0)
BASOS ABS: 0 10*3/uL (ref 0.0–0.1)
EOS%: 0.1 % (ref 0.0–7.0)
Eosinophils Absolute: 0 10*3/uL (ref 0.0–0.5)
HEMATOCRIT: 32.2 % — AB (ref 34.8–46.6)
HGB: 10.3 g/dL — ABNORMAL LOW (ref 11.6–15.9)
LYMPH#: 1.3 10*3/uL (ref 0.9–3.3)
LYMPH%: 9.4 % — ABNORMAL LOW (ref 14.0–49.7)
MCH: 26.5 pg (ref 25.1–34.0)
MCHC: 32 g/dL (ref 31.5–36.0)
MCV: 82.7 fL (ref 79.5–101.0)
MONO#: 2.2 10*3/uL — AB (ref 0.1–0.9)
MONO%: 16.3 % — ABNORMAL HIGH (ref 0.0–14.0)
NEUT#: 10.2 10*3/uL — ABNORMAL HIGH (ref 1.5–6.5)
NEUT%: 73.9 % (ref 38.4–76.8)
PLATELETS: 213 10*3/uL (ref 145–400)
RBC: 3.9 10*6/uL (ref 3.70–5.45)
RDW: 21.6 % — ABNORMAL HIGH (ref 11.2–14.5)
WBC: 13.7 10*3/uL — ABNORMAL HIGH (ref 3.9–10.3)

## 2015-05-19 LAB — COMPREHENSIVE METABOLIC PANEL
ALT: 19 U/L (ref 0–55)
ANION GAP: 6 meq/L (ref 3–11)
AST: 42 U/L — ABNORMAL HIGH (ref 5–34)
Albumin: 1.8 g/dL — ABNORMAL LOW (ref 3.5–5.0)
Alkaline Phosphatase: 161 U/L — ABNORMAL HIGH (ref 40–150)
BUN: 13.4 mg/dL (ref 7.0–26.0)
CALCIUM: 7.7 mg/dL — AB (ref 8.4–10.4)
CHLORIDE: 104 meq/L (ref 98–109)
CO2: 24 mEq/L (ref 22–29)
CREATININE: 0.9 mg/dL (ref 0.6–1.1)
EGFR: >90 mL/min/{1.73_m2} (ref >90)
Glucose: 93 mg/dl (ref 70–140)
POTASSIUM: 3.9 meq/L (ref 3.5–5.1)
Sodium: 135 mEq/L — ABNORMAL LOW (ref 136–145)
Total Bilirubin: 0.58 mg/dL (ref 0.20–1.20)
Total Protein: 7 g/dL (ref 6.4–8.3)

## 2015-05-19 MED ORDER — DEXAMETHASONE 2 MG PO TABS: ORAL_TABLET | ORAL | Status: DC

## 2015-05-19 MED ORDER — MORPHINE SULFATE ER 30 MG PO TBCR: 30.0000 mg | EXTENDED_RELEASE_TABLET | Freq: Two times a day (BID) | ORAL | Status: DC

## 2015-05-19 NOTE — Patient Instructions (Signed)
Dr. Benay Spice recommends Taxol weekly for 4 doses and Cyramza every 2 weeks.   Ramucirumab injection What is this medicine? RAMUCIRUMAB (ra mue SIR ue mab) is a monoclonal antibody. It is used to treat stomach cancer, colorectal cancer, or lung cancer. This medicine may be used for other purposes; ask your health care provider or pharmacist if you have questions. What should I tell my health care provider before I take this medicine? They need to know if you have any of these conditions: -bleeding disorders -blood clots -heart disease, including heart failure, heart attack, or chest pain (angina) -high blood pressure -infection (especially a virus infection such as chickenpox, cold sores, or herpes) -protein in your urine -recent surgery -stroke -an unusual or allergic reaction to ramucirumab, other medicines, foods, dyes, or preservatives -pregnant or trying to get pregnant -breast-feeding How should I use this medicine? This medicine is for infusion into a vein. It is given by a health care professional in a hospital or clinic setting. Talk to your pediatrician regarding the use of this medicine in children. Special care may be needed. Overdosage: If you think you have taken too much of this medicine contact a poison control center or emergency room at once. NOTE: This medicine is only for you. Do not share this medicine with others. What if I miss a dose? It is important not to miss your dose. Call your doctor or health care professional if you are unable to keep an appointment. What may interact with this medicine? Interactions have not been studied. This list may not describe all possible interactions. Give your health care provider a list of all the medicines, herbs, non-prescription drugs, or dietary supplements you use. Also tell them if you smoke, drink alcohol, or use illegal drugs. Some items may interact with your medicine. What should I watch for while using this  medicine? Your condition will be monitored carefully while you are receiving this medicine. You will need to to check your blood pressure and have your blood and urine tested while you are taking this medicine. Your condition will be monitored carefully while you are receiving this medicine. This medicine may increase your risk to bruise or bleed. Call your doctor or health care professional if you notice any unusual bleeding. This medicine may rarely cause 'gastrointestinal perforation' (holes in the stomach, intestines or colon), a serious side effect requiring surgery to repair. This medicine should be started at least 28 days following major surgery and the site of the surgery should be totally healed. Check with your doctor before scheduling dental work or surgery while you are receiving this treatment. Talk to your doctor if you have recently had surgery or if you have a wound that has not healed. Do not become pregnant while taking this medicine or for 3 months after stopping it. Women should inform their doctor if they wish to become pregnant or think they might be pregnant. There is a potential for serious side effects to an unborn child. Talk to your health care professional or pharmacist for more information. What side effects may I notice from receiving this medicine? Side effects that you should report to your doctor or health care professional as soon as possible: -allergic reactions like skin rash, itching or hives, breathing problems, swelling of the face, lips, or tongue -signs of infection - fever or chills, cough, sore throat -chest pain or chest tightness -confusion -dizziness -feeling faint or lightheaded, falls -severe abdominal pain -severe nausea, vomiting -signs and symptoms of  bleeding such as bloody or black, tarry stools; red or dark-brown urine; spitting up blood or brown material that looks like coffee grounds; red spots on the skin; unusual bruising or bleeding from  the eye, gums, or nose -signs and symptoms of a blood clot such as breathing problems; changes in vision; chest pain; severe, sudden headache; pain, swelling, warmth in the leg; trouble speaking; sudden numbness or weakness of the face, arm or leg -symptoms of a stroke: change in mental awareness, inability to talk or move one side of the body -trouble walking, dizziness, loss of balance or coordination Side effects that usually do not require medical attention (Report these to your doctor or health care professional if they continue or are bothersome.): -cold, clammy skin -constipation -diarrhea -headache -nausea, vomiting -stomach pain -unusually slow heartbeat -unusually weak or tired This list may not describe all possible side effects. Call your doctor for medical advice about side effects. You may report side effects to FDA at 1-800-FDA-1088. Where should I keep my medicine? This drug is given in a hospital or clinic and will not be stored at home. NOTE: This sheet is a summary. It may not cover all possible information. If you have questions about this medicine, talk to your doctor, pharmacist, or health care provider.    2016, [employer]vier/Gold Standard. (2014-05-12 17:37:19)  Paclitaxel injection What is this medicine? PACLITAXEL (PAK li TAX el) is a chemotherapy drug. It targets fast dividing cells, like cancer cells, and causes these cells to die. This medicine is used to treat ovarian cancer, breast cancer, and other cancers. This medicine may be used for other purposes; ask your health care provider or pharmacist if you have questions. What should I tell my health care provider before I take this medicine? They need to know if you have any of these conditions: -blood disorders -irregular heartbeat -infection (especially a virus infection such as chickenpox, cold sores, or herpes) -liver disease -previous or ongoing radiation therapy -an unusual or allergic reaction to  paclitaxel, alcohol, polyoxyethylated castor oil, other chemotherapy agents, other medicines, foods, dyes, or preservatives -pregnant or trying to get pregnant -breast-feeding How should I use this medicine? This drug is given as an infusion into a vein. It is administered in a hospital or clinic by a specially trained health care professional. Talk to your pediatrician regarding the use of this medicine in children. Special care may be needed. Overdosage: If you think you have taken too much of this medicine contact a poison control center or emergency room at once. NOTE: This medicine is only for you. Do not share this medicine with others. What if I miss a dose? It is important not to miss your dose. Call your doctor or health care professional if you are unable to keep an appointment. What may interact with this medicine? Do not take this medicine with any of the following medications: -disulfiram -metronidazole This medicine may also interact with the following medications: -cyclosporine -diazepam -ketoconazole -medicines to increase blood counts like filgrastim, pegfilgrastim, sargramostim -other chemotherapy drugs like cisplatin, doxorubicin, epirubicin, etoposide, teniposide, vincristine -quinidine -testosterone -vaccines -verapamil Talk to your doctor or health care professional before taking any of these medicines: -acetaminophen -aspirin -ibuprofen -ketoprofen -naproxen This list may not describe all possible interactions. Give your health care provider a list of all the medicines, herbs, non-prescription drugs, or dietary supplements you use. Also tell them if you smoke, drink alcohol, or use illegal drugs. Some items may interact with your medicine. What should  I watch for while using this medicine? Your condition will be monitored carefully while you are receiving this medicine. You will need important blood work done while you are taking this medicine. This drug may  make you feel generally unwell. This is not uncommon, as chemotherapy can affect healthy cells as well as cancer cells. Report any side effects. Continue your course of treatment even though you feel ill unless your doctor tells you to stop. This medicine can cause serious allergic reactions. To reduce your risk you will need to take other medicine(s) before treatment with this medicine. In some cases, you may be given additional medicines to help with side effects. Follow all directions for their use. Call your doctor or health care professional for advice if you get a fever, chills or sore throat, or other symptoms of a cold or flu. Do not treat yourself. This drug decreases your body's ability to fight infections. Try to avoid being around people who are sick. This medicine may increase your risk to bruise or bleed. Call your doctor or health care professional if you notice any unusual bleeding. Be careful brushing and flossing your teeth or using a toothpick because you may get an infection or bleed more easily. If you have any dental work done, tell your dentist you are receiving this medicine. Avoid taking products that contain aspirin, acetaminophen, ibuprofen, naproxen, or ketoprofen unless instructed by your doctor. These medicines may hide a fever. Do not become pregnant while taking this medicine. Women should inform their doctor if they wish to become pregnant or think they might be pregnant. There is a potential for serious side effects to an unborn child. Talk to your health care professional or pharmacist for more information. Do not breast-feed an infant while taking this medicine. Men are advised not to father a child while receiving this medicine. This product may contain alcohol. Ask your pharmacist or healthcare provider if this medicine contains alcohol. Be sure to tell all healthcare providers you are taking this medicine. Certain medicines, like metronidazole and disulfiram, can cause  an unpleasant reaction when taken with alcohol. The reaction includes flushing, headache, nausea, vomiting, sweating, and increased thirst. The reaction can last from 30 minutes to several hours. What side effects may I notice from receiving this medicine? Side effects that you should report to your doctor or health care professional as soon as possible: -allergic reactions like skin rash, itching or hives, swelling of the face, lips, or tongue -low blood counts - This drug may decrease the number of white blood cells, red blood cells and platelets. You may be at increased risk for infections and bleeding. -signs of infection - fever or chills, cough, sore throat, pain or difficulty passing urine -signs of decreased platelets or bleeding - bruising, pinpoint red spots on the skin, black, tarry stools, nosebleeds -signs of decreased red blood cells - unusually weak or tired, fainting spells, lightheadedness -breathing problems -chest pain -high or low blood pressure -mouth sores -nausea and vomiting -pain, swelling, redness or irritation at the injection site -pain, tingling, numbness in the hands or feet -slow or irregular heartbeat -swelling of the ankle, feet, hands Side effects that usually do not require medical attention (report to your doctor or health care professional if they continue or are bothersome): -bone pain -complete hair loss including hair on your head, underarms, pubic hair, eyebrows, and eyelashes -changes in the color of fingernails -diarrhea -loosening of the fingernails -loss of appetite -muscle or joint pain -red  flush to skin -sweating This list may not describe all possible side effects. Call your doctor for medical advice about side effects. You may report side effects to FDA at 1-800-FDA-1088. Where should I keep my medicine? This drug is given in a hospital or clinic and will not be stored at home. NOTE: This sheet is a summary. It may not cover all possible  information. If you have questions about this medicine, talk to your doctor, pharmacist, or health care provider.    2016, Elsevier/Gold Standard. (2014-10-29 13:02:56)

## 2015-05-19 NOTE — Telephone Encounter (Signed)
Per staff message and POF I have scheduled appts. Advised scheduler of appts. JMW  

## 2015-05-19 NOTE — Progress Notes (Signed)
[facility] OFFICE PROGRESS NOTE   Diagnosis: Gastric cancer  INTERVAL HISTORY:   Ms. Melland returns as scheduled. She continues to have intermittent nausea and vomiting. She is eating and attributes weight gain to an increased appetite. She has developed pain at the right hip area since undergoing a restaging CT 05/14/2015. She reports swelling at the left lower leg and ankle. Objective:  Vital signs in last 24 hours:  Blood pressure 124/71, pulse 124, temperature 98.3 F (36.8 C), temperature source Oral, resp. rate 18, height 5' 6"  (1.676 m), weight 134 lb 3.2 oz (60.873 kg), SpO2 100 %.    HEENT: No thrush or ulcers, mild whitecoat over the tongue. Resp: Lungs clear bilaterally Cardio: Regular rate and rhythm GI: No hepatomegaly, masslike fullness at the mid upper abdominal wall Vascular: Trace edema at the left lower leg, no erythema Musculoskeletal: Tender at the right upper medial trochanter, no mass, no rash, no pain with motion at the right hip    Portacath/PICC-without erythema  Lab Results:  Lab Results  Component Value Date   WBC 13.7* 05/19/2015   HGB 10.3* 05/19/2015   HCT 32.2* 05/19/2015   MCV 82.7 05/19/2015   PLT 213 05/19/2015   NEUTROABS 10.2* 05/19/2015     Imaging: CTs of the chest, abdomen, and pelvis 05/14/2015-images were reviewed with Ms. Reisch, progressive pelvic masses and the anterior abdominal wall mass  Medications: I have reviewed the patient's current medications.  Assessment/Plan: 1. Gastric cancer status post upper endoscopy 09/22/2013 with findings of a partially obstructing oozing cratered gastric ulcer in the gastric antrum.  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   CT scans chest/abdomen/pelvis 10/03/2013 showed a 7 x 5 mm groundglass opacity in the superior segment of the right lower lobe; a 4.5 mm nodular density in the right upper lobe; bilateral axillary  adenopathy; low density area anteriorly in the left hepatic lobe most consistent with fatty infiltration; another hypodense area within the medial segment of the left hepatic lobe; severe gastric distention; mild bilateral inguinal adenopathy.   Subtotal gastrectomy and lymph node dissection with creation of a gastrojejunostomy 10/10/2013. No evidence of distant metastatic disease noted at the time of surgery. No evidence of liver metastases. 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 Tumor involved the serosal surface. Resection margins were negative. 10 of 17 lymph nodes were positive for metastatic adenocarcinoma. Her-2 not amplified   Cycle 1 weekly 5-FU/leucovorin 11/14/2013, dose reduced beginning with week 3 secondary to mucositis and hand/foot syndrome.   Cycle 2 weekly 5-FU/leucovorin beginning 12/19/2013.  Initiation of radiation/Xeloda 01/26/2014. She decided to discontinue further radiation/Xeloda after one fraction due to significant nausea/vomiting.  Cycle 3 weekly 5-FU/leucovorin beginning 02/13/2014  Cycle 4 weekly 5-FU/leucovorin beginning 03/18/2014  Cycle 5 weekly 5-FU/leucovorin beginning 04/28/2014  CTs 06/16/2014 with new enhancing nodular lesions along the ventral peritoneal surface  CT-guided biopsy of an anterior peritoneal lesion on 06/24/2014 confirmed poorly differentiated adenocarcinoma  CTs 09/03/2014 with an increasing confluent enhancing lesion located at the ventral peritoneal surface midline of the anterior abdomen. Lesion inseparable from the posterior aspect of the abdominal wall.  CT abdomen/pelvis 11/18/2014 with significant progression of metastatic disease with enlarging metastasis within the anterior abdominal wall, enlargement of multiple peritoneal implants, interval development of masses within the pancreatic head and caudate lobe of the liver. 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 New complex solid and cystic left adnexal lesion.  Placement of percutaneous internal/external biliary drain 11/19/2014, permanent biliary stent placed 02/23/2015  Cycle 1 FOLFOX 12/09/2014  Cycle 2 FOLFOX 12/23/2014  CT 01/06/2015 with a decrease in the anterior abdominal wall tumor  Cycle 3 FOLFOX 03/04/2015  Cycle 4 FOLFOX 03/18/2015  Cycle 5 FOLFOX 04/01/2015  Cycle 6 FOLFOX 04/21/2015  Restaging CTs 05/14/2015 revealed enlargement of pelvic masses and progressive peritoneal carcinomatosis 2. Gastric outlet obstruction secondary to #1. Resolved. 3. Nausea/vomiting secondary to #2. She continues to have intermittent nausea and vomiting 4. Abdominal /flank pain secondary to #1 5. History of weight loss. 6. Microcytic anemia, likely iron deficiency. She received iron dextran 10/11/2013. 7. Lupus/Sjgren's. 8. Pneumonia during hospitalization July 2015.  9. Abdominal abscesses with body fluid culture 10/20/2013 showing moderate Candida tropicalis. She was discharged home on a 4 week course of fluconazole.  10. Mucositis and hand/foot syndrome following cycle 1-week #3 5-FU/leucovorin, the 5-FU and leucovorin were dose reduced. 11. 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 12. Status post LEEP 11/07/2013. 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 Pap smear 06/08/2014-negative for intraepithelial lesions or malignancy 13. Gram-negative sepsis 01/06/2015-blood cultures positive for Klebsiella/Escherichia coli, culture from biliary drainage positive for Enterobacter, placement of a bile duct stent 02/23/2015, removal of the biliary drain 03/10/2015 14. New right hydronephrosis on CT 01/06/2015, stable on ultrasound 01/21/2015, mildly progressive right hydronephrosis secondary to any elongated obstructing mass on the CT 01/30/2015 status post placement of a  right ureter stent 02/01/2015 15. Admission 01/20/2015 with fever/sepsis, one blood culture positive for Klebsiella pneumoniae, urine culture positive for a resistant Escherichia coli 16. Diarrhea-negative stool for C. difficile on 01/30/2015 and 02/09/2015, potentially related to antibiotic therapy, she will begin a probiotic and Lomotil 17. Pain at the left low neck/shoulder-CTs of the neck and chest 02/22/2015 revealed no explanation for the next/shoulder pain 18. Left lower leg/ankle edema. Negative venous Doppler 03/07/2015.     Disposition:  Ms. Molock has metastatic gastric cancer. I reviewed the restaging CT images with her. There is evidence of disease progression in the abdominal cavity. The right upper thigh pain may be related to gastric cancer or a benign musculoskeletal condition. We will investigate this further if she has persistent pain.  The leg edema is likely secondary to hypoalbuminemia and venous obstruction from tumor in the pelvis. I have a low clinical suspicion for a DVT.  She would like to proceed with salvage systemic therapy. I recommend Taxol/ ramucirumab. We discussed the potential toxicities associated with Taxol including the chance for alopecia, and allergic reaction, and neuropathy. We reviewed toxicities associated with ramucirumab. She agrees to proceed.  Taxol will be given weekly 4 consecutive weeks and ramuciumab will be given every 2 weeks. Treatment will start 05/26/2015. She will return for an office visit 06/02/2015.  Betsy Coder, MD  05/19/2015  12:19 PM

## 2015-05-19 NOTE — Patient Instructions (Signed)
Dr. Benay Spice recommends Taxol every week for 4 weeks. Cyramza every 2 weeks.   Ramucirumab injection What is this medicine? RAMUCIRUMAB (ra mue SIR ue mab) is a monoclonal antibody. It is used to treat stomach cancer, colorectal cancer, or lung cancer. This medicine may be used for other purposes; ask your health care provider or pharmacist if you have questions. What should I tell my health care provider before I take this medicine? They need to know if you have any of these conditions: -bleeding disorders -blood clots -heart disease, including heart failure, heart attack, or chest pain (angina) -high blood pressure -infection (especially a virus infection such as chickenpox, cold sores, or herpes) -protein in your urine -recent surgery -stroke -an unusual or allergic reaction to ramucirumab, other medicines, foods, dyes, or preservatives -pregnant or trying to get pregnant -breast-feeding How should I use this medicine? This medicine is for infusion into a vein. It is given by a health care professional in a hospital or clinic setting. Talk to your pediatrician regarding the use of this medicine in children. Special care may be needed. Overdosage: If you think you have taken too much of this medicine contact a poison control center or emergency room at once. NOTE: This medicine is only for you. Do not share this medicine with others. What if I miss a dose? It is important not to miss your dose. Call your doctor or health care professional if you are unable to keep an appointment. What may interact with this medicine? Interactions have not been studied. This list may not describe all possible interactions. Give your health care provider a list of all the medicines, herbs, non-prescription drugs, or dietary supplements you use. Also tell them if you smoke, drink alcohol, or use illegal drugs. Some items may interact with your medicine. What should I watch for while using this  medicine? Your condition will be monitored carefully while you are receiving this medicine. You will need to to check your blood pressure and have your blood and urine tested while you are taking this medicine. Your condition will be monitored carefully while you are receiving this medicine. This medicine may increase your risk to bruise or bleed. Call your doctor or health care professional if you notice any unusual bleeding. This medicine may rarely cause 'gastrointestinal perforation' (holes in the stomach, intestines or colon), a serious side effect requiring surgery to repair. This medicine should be started at least 28 days following major surgery and the site of the surgery should be totally healed. Check with your doctor before scheduling dental work or surgery while you are receiving this treatment. Talk to your doctor if you have recently had surgery or if you have a wound that has not healed. Do not become pregnant while taking this medicine or for 3 months after stopping it. Women should inform their doctor if they wish to become pregnant or think they might be pregnant. There is a potential for serious side effects to an unborn child. Talk to your health care professional or pharmacist for more information. What side effects may I notice from receiving this medicine? Side effects that you should report to your doctor or health care professional as soon as possible: -allergic reactions like skin rash, itching or hives, breathing problems, swelling of the face, lips, or tongue -signs of infection - fever or chills, cough, sore throat -chest pain or chest tightness -confusion -dizziness -feeling faint or lightheaded, falls -severe abdominal pain -severe nausea, vomiting -signs and symptoms of  bleeding such as bloody or black, tarry stools; red or dark-brown urine; spitting up blood or brown material that looks like coffee grounds; red spots on the skin; unusual bruising or bleeding from  the eye, gums, or nose -signs and symptoms of a blood clot such as breathing problems; changes in vision; chest pain; severe, sudden headache; pain, swelling, warmth in the leg; trouble speaking; sudden numbness or weakness of the face, arm or leg -symptoms of a stroke: change in mental awareness, inability to talk or move one side of the body -trouble walking, dizziness, loss of balance or coordination Side effects that usually do not require medical attention (Report these to your doctor or health care professional if they continue or are bothersome.): -cold, clammy skin -constipation -diarrhea -headache -nausea, vomiting -stomach pain -unusually slow heartbeat -unusually weak or tired This list may not describe all possible side effects. Call your doctor for medical advice about side effects. You may report side effects to FDA at 1-800-FDA-1088. Where should I keep my medicine? This drug is given in a hospital or clinic and will not be stored at home. NOTE: This sheet is a summary. It may not cover all possible information. If you have questions about this medicine, talk to your doctor, pharmacist, or health care provider.    2016, [employer]. (2014-05-12 17:37:19)  Paclitaxel injection What is this medicine? PACLITAXEL (PAK li TAX el) is a chemotherapy drug. It targets fast dividing cells, like cancer cells, and causes these cells to die. This medicine is used to treat ovarian cancer, breast cancer, and other cancers. This medicine may be used for other purposes; ask your health care provider or pharmacist if you have questions. What should I tell my health care provider before I take this medicine? They need to know if you have any of these conditions: -blood disorders -irregular heartbeat -infection (especially a virus infection such as chickenpox, cold sores, or herpes) -liver disease -previous or ongoing radiation therapy -an unusual or allergic reaction to  paclitaxel, alcohol, polyoxyethylated castor oil, other chemotherapy agents, other medicines, foods, dyes, or preservatives -pregnant or trying to get pregnant -breast-feeding How should I use this medicine? This drug is given as an infusion into a vein. It is administered in a hospital or clinic by a specially trained health care professional. Talk to your pediatrician regarding the use of this medicine in children. Special care may be needed. Overdosage: If you think you have taken too much of this medicine contact a poison control center or emergency room at once. NOTE: This medicine is only for you. Do not share this medicine with others. What if I miss a dose? It is important not to miss your dose. Call your doctor or health care professional if you are unable to keep an appointment. What may interact with this medicine? Do not take this medicine with any of the following medications: -disulfiram -metronidazole This medicine may also interact with the following medications: -cyclosporine -diazepam -ketoconazole -medicines to increase blood counts like filgrastim, pegfilgrastim, sargramostim -other chemotherapy drugs like cisplatin, doxorubicin, epirubicin, etoposide, teniposide, vincristine -quinidine -testosterone -vaccines -verapamil Talk to your doctor or health care professional before taking any of these medicines: -acetaminophen -aspirin -ibuprofen -ketoprofen -naproxen This list may not describe all possible interactions. Give your health care provider a list of all the medicines, herbs, non-prescription drugs, or dietary supplements you use. Also tell them if you smoke, drink alcohol, or use illegal drugs. Some items may interact with your medicine. What should  I watch for while using this medicine? Your condition will be monitored carefully while you are receiving this medicine. You will need important blood work done while you are taking this medicine. This drug may  make you feel generally unwell. This is not uncommon, as chemotherapy can affect healthy cells as well as cancer cells. Report any side effects. Continue your course of treatment even though you feel ill unless your doctor tells you to stop. This medicine can cause serious allergic reactions. To reduce your risk you will need to take other medicine(s) before treatment with this medicine. In some cases, you may be given additional medicines to help with side effects. Follow all directions for their use. Call your doctor or health care professional for advice if you get a fever, chills or sore throat, or other symptoms of a cold or flu. Do not treat yourself. This drug decreases your body's ability to fight infections. Try to avoid being around people who are sick. This medicine may increase your risk to bruise or bleed. Call your doctor or health care professional if you notice any unusual bleeding. Be careful brushing and flossing your teeth or using a toothpick because you may get an infection or bleed more easily. If you have any dental work done, tell your dentist you are receiving this medicine. Avoid taking products that contain aspirin, acetaminophen, ibuprofen, naproxen, or ketoprofen unless instructed by your doctor. These medicines may hide a fever. Do not become pregnant while taking this medicine. Women should inform their doctor if they wish to become pregnant or think they might be pregnant. There is a potential for serious side effects to an unborn child. Talk to your health care professional or pharmacist for more information. Do not breast-feed an infant while taking this medicine. Men are advised not to father a child while receiving this medicine. This product may contain alcohol. Ask your pharmacist or healthcare provider if this medicine contains alcohol. Be sure to tell all healthcare providers you are taking this medicine. Certain medicines, like metronidazole and disulfiram, can cause  an unpleasant reaction when taken with alcohol. The reaction includes flushing, headache, nausea, vomiting, sweating, and increased thirst. The reaction can last from 30 minutes to several hours. What side effects may I notice from receiving this medicine? Side effects that you should report to your doctor or health care professional as soon as possible: -allergic reactions like skin rash, itching or hives, swelling of the face, lips, or tongue -low blood counts - This drug may decrease the number of white blood cells, red blood cells and platelets. You may be at increased risk for infections and bleeding. -signs of infection - fever or chills, cough, sore throat, pain or difficulty passing urine -signs of decreased platelets or bleeding - bruising, pinpoint red spots on the skin, black, tarry stools, nosebleeds -signs of decreased red blood cells - unusually weak or tired, fainting spells, lightheadedness -breathing problems -chest pain -high or low blood pressure -mouth sores -nausea and vomiting -pain, swelling, redness or irritation at the injection site -pain, tingling, numbness in the hands or feet -slow or irregular heartbeat -swelling of the ankle, feet, hands Side effects that usually do not require medical attention (report to your doctor or health care professional if they continue or are bothersome): -bone pain -complete hair loss including hair on your head, underarms, pubic hair, eyebrows, and eyelashes -changes in the color of fingernails -diarrhea -loosening of the fingernails -loss of appetite -muscle or joint pain -red  flush to skin -sweating This list may not describe all possible side effects. Call your doctor for medical advice about side effects. You may report side effects to FDA at 1-800-FDA-1088. Where should I keep my medicine? This drug is given in a hospital or clinic and will not be stored at home. NOTE: This sheet is a summary. It may not cover all possible  information. If you have questions about this medicine, talk to your doctor, pharmacist, or health care provider.    2016, [employer]. (2014-10-29 13:02:56)

## 2015-05-19 NOTE — Telephone Encounter (Signed)
Pt confirmed labs/ov per 02/22 POF, gave pt AVS and Calendar...Marland KitchenMarland KitchenCherylann Banas, sent msg to add chemo new treatment

## 2015-05-21 ENCOUNTER — Ambulatory Visit: Payer: BLUE CROSS/BLUE SHIELD

## 2015-05-23 ENCOUNTER — Other Ambulatory Visit: Payer: Self-pay | Admitting: Oncology

## 2015-05-26 ENCOUNTER — Ambulatory Visit (HOSPITAL_COMMUNITY): Admission: RE | Admit: 2015-05-26 | Payer: BLUE CROSS/BLUE SHIELD | Source: Ambulatory Visit

## 2015-05-26 ENCOUNTER — Ambulatory Visit (HOSPITAL_BASED_OUTPATIENT_CLINIC_OR_DEPARTMENT_OTHER): Payer: BLUE CROSS/BLUE SHIELD | Admitting: Nurse Practitioner

## 2015-05-26 ENCOUNTER — Ambulatory Visit (HOSPITAL_BASED_OUTPATIENT_CLINIC_OR_DEPARTMENT_OTHER): Payer: BLUE CROSS/BLUE SHIELD

## 2015-05-26 ENCOUNTER — Ambulatory Visit: Payer: BLUE CROSS/BLUE SHIELD

## 2015-05-26 ENCOUNTER — Other Ambulatory Visit (HOSPITAL_BASED_OUTPATIENT_CLINIC_OR_DEPARTMENT_OTHER): Payer: BLUE CROSS/BLUE SHIELD

## 2015-05-26 VITALS — BP 109/62 | HR 111 | Temp 98.9°F | Resp 18 | Ht 66.0 in | Wt 142.0 lb

## 2015-05-26 VITALS — BP 126/85 | HR 100 | Temp 97.9°F | Resp 18

## 2015-05-26 DIAGNOSIS — G893 Neoplasm related pain (acute) (chronic): Secondary | ICD-10-CM | POA: Diagnosis not present

## 2015-05-26 DIAGNOSIS — C162 Malignant neoplasm of body of stomach: Secondary | ICD-10-CM

## 2015-05-26 DIAGNOSIS — Z5111 Encounter for antineoplastic chemotherapy: Secondary | ICD-10-CM | POA: Diagnosis not present

## 2015-05-26 DIAGNOSIS — E8809 Other disorders of plasma-protein metabolism, not elsewhere classified: Secondary | ICD-10-CM | POA: Diagnosis not present

## 2015-05-26 DIAGNOSIS — R609 Edema, unspecified: Secondary | ICD-10-CM

## 2015-05-26 DIAGNOSIS — Z95828 Presence of other vascular implants and grafts: Secondary | ICD-10-CM

## 2015-05-26 DIAGNOSIS — C163 Malignant neoplasm of pyloric antrum: Secondary | ICD-10-CM

## 2015-05-26 DIAGNOSIS — Z5112 Encounter for antineoplastic immunotherapy: Secondary | ICD-10-CM

## 2015-05-26 DIAGNOSIS — E860 Dehydration: Secondary | ICD-10-CM

## 2015-05-26 DIAGNOSIS — R600 Localized edema: Secondary | ICD-10-CM | POA: Diagnosis not present

## 2015-05-26 LAB — COMPREHENSIVE METABOLIC PANEL
ALBUMIN: 1.6 g/dL — AB (ref 3.5–5.0)
ALK PHOS: 146 U/L (ref 40–150)
ALT: 16 U/L (ref 0–55)
AST: 49 U/L — AB (ref 5–34)
Anion Gap: 7 mEq/L (ref 3–11)
BILIRUBIN TOTAL: 0.76 mg/dL (ref 0.20–1.20)
BUN: 19.6 mg/dL (ref 7.0–26.0)
CALCIUM: 7.5 mg/dL — AB (ref 8.4–10.4)
CO2: 22 mEq/L (ref 22–29)
CREATININE: 1.2 mg/dL — AB (ref 0.6–1.1)
Chloride: 103 mEq/L (ref 98–109)
EGFR: 63 mL/min/{1.73_m2} — ABNORMAL LOW (ref >90)
Glucose: 99 mg/dl (ref 70–140)
Potassium: 4.3 mEq/L (ref 3.5–5.1)
Sodium: 132 mEq/L — ABNORMAL LOW (ref 136–145)
TOTAL PROTEIN: 6.6 g/dL (ref 6.4–8.3)

## 2015-05-26 LAB — CBC WITH DIFFERENTIAL/PLATELET
BASO%: 0.5 % (ref 0.0–2.0)
BASOS ABS: 0.1 10*3/uL (ref 0.0–0.1)
EOS%: 0.1 % (ref 0.0–7.0)
Eosinophils Absolute: 0 10*3/uL (ref 0.0–0.5)
HEMATOCRIT: 30.6 % — AB (ref 34.8–46.6)
HEMOGLOBIN: 9.9 g/dL — AB (ref 11.6–15.9)
LYMPH#: 0.8 10*3/uL — AB (ref 0.9–3.3)
LYMPH%: 5.9 % — ABNORMAL LOW (ref 14.0–49.7)
MCH: 26.3 pg (ref 25.1–34.0)
MCHC: 32.3 g/dL (ref 31.5–36.0)
MCV: 81.4 fL (ref 79.5–101.0)
MONO#: 1.8 10*3/uL — ABNORMAL HIGH (ref 0.1–0.9)
MONO%: 12.9 % (ref 0.0–14.0)
NEUT#: 10.9 10*3/uL — ABNORMAL HIGH (ref 1.5–6.5)
NEUT%: 80.6 % — ABNORMAL HIGH (ref 38.4–76.8)
Platelets: 219 10*3/uL (ref 145–400)
RBC: 3.76 10*6/uL (ref 3.70–5.45)
RDW: 21.9 % — AB (ref 11.2–14.5)
WBC: 13.6 10*3/uL — ABNORMAL HIGH (ref 3.9–10.3)

## 2015-05-26 LAB — UA PROTEIN, DIPSTICK - CHCC: PROTEIN: 30 mg/dL

## 2015-05-26 MED ORDER — SODIUM CHLORIDE 0.9% FLUSH
10.0000 mL | INTRAVENOUS | Status: DC | PRN
  Administered 2015-05-26: 10 mL via INTRAVENOUS
  Filled 2015-05-26: qty 10

## 2015-05-26 MED ORDER — SODIUM CHLORIDE 0.9% FLUSH
10.0000 mL | INTRAVENOUS | Status: DC | PRN
  Administered 2015-05-26: 10 mL
  Filled 2015-05-26: qty 10

## 2015-05-26 MED ORDER — DEXTROSE 5 % IV SOLN
80.0000 mg/m2 | Freq: Once | INTRAVENOUS | Status: AC
  Administered 2015-05-26: 132 mg via INTRAVENOUS
  Filled 2015-05-26: qty 22

## 2015-05-26 MED ORDER — SODIUM CHLORIDE 0.9 % IV SOLN
8.0000 mg/kg | Freq: Once | INTRAVENOUS | Status: AC
  Administered 2015-05-26: 500 mg via INTRAVENOUS
  Filled 2015-05-26: qty 50

## 2015-05-26 MED ORDER — SODIUM CHLORIDE 0.9 % IV SOLN: Freq: Once | INTRAVENOUS | Status: DC

## 2015-05-26 MED ORDER — FAMOTIDINE IN NACL 20-0.9 MG/50ML-% IV SOLN
INTRAVENOUS | Status: AC
  Filled 2015-05-26: qty 50

## 2015-05-26 MED ORDER — DIPHENHYDRAMINE HCL 50 MG/ML IJ SOLN
25.0000 mg | Freq: Once | INTRAMUSCULAR | Status: AC
  Administered 2015-05-26: 25 mg via INTRAVENOUS

## 2015-05-26 MED ORDER — HEPARIN SOD (PORK) LOCK FLUSH 100 UNIT/ML IV SOLN
500.0000 [IU] | Freq: Once | INTRAVENOUS | Status: AC | PRN
  Administered 2015-05-26: 500 [IU]
  Filled 2015-05-26: qty 5

## 2015-05-26 MED ORDER — ACETAMINOPHEN 325 MG PO TABS
650.0000 mg | ORAL_TABLET | Freq: Once | ORAL | Status: AC
  Administered 2015-05-26: 650 mg via ORAL

## 2015-05-26 MED ORDER — DIPHENHYDRAMINE HCL 50 MG/ML IJ SOLN
INTRAMUSCULAR | Status: AC
  Filled 2015-05-26: qty 1

## 2015-05-26 MED ORDER — SODIUM CHLORIDE 0.9 % IV SOLN
Freq: Once | INTRAVENOUS | Status: AC
  Administered 2015-05-26: 12:00:00 via INTRAVENOUS

## 2015-05-26 MED ORDER — ACETAMINOPHEN 325 MG PO TABS
ORAL_TABLET | ORAL | Status: AC
  Filled 2015-05-26: qty 2

## 2015-05-26 MED ORDER — SODIUM CHLORIDE 0.9 % IV SOLN
Freq: Once | INTRAVENOUS | Status: AC
  Administered 2015-05-26: 12:00:00 via INTRAVENOUS
  Filled 2015-05-26: qty 4

## 2015-05-26 MED ORDER — FAMOTIDINE IN NACL 20-0.9 MG/50ML-% IV SOLN
20.0000 mg | Freq: Once | INTRAVENOUS | Status: AC
  Administered 2015-05-26: 20 mg via INTRAVENOUS

## 2015-05-26 MED ORDER — SODIUM CHLORIDE 0.9 % IV SOLN: 20.0000 mg | Freq: Once | INTRAVENOUS | Status: DC

## 2015-05-26 NOTE — Patient Instructions (Signed)
[facility] Discharge Instructions for Patients Receiving Chemotherapy  Today you received the following chemotherapy agents Taxol/Cyramza  To help prevent nausea and vomiting after your treatment, we encourage you to take your nausea medication    If you develop nausea and vomiting that is not controlled by your nausea medication, call the clinic.   BELOW ARE SYMPTOMS THAT SHOULD BE REPORTED IMMEDIATELY:  *FEVER GREATER THAN 100.5 F  *CHILLS WITH OR WITHOUT FEVER  NAUSEA AND VOMITING THAT IS NOT CONTROLLED WITH YOUR NAUSEA MEDICATION  *UNUSUAL SHORTNESS OF BREATH  *UNUSUAL BRUISING OR BLEEDING  TENDERNESS IN MOUTH AND THROAT WITH OR WITHOUT PRESENCE OF ULCERS  *URINARY PROBLEMS  *BOWEL PROBLEMS  UNUSUAL RASH Items with * indicate a potential emergency and should be followed up as soon as possible.  Feel free to call the clinic you have any questions or concerns. The clinic phone number is [phone].  Please show the [facility] at check-in to the Emergency Department and triage nurse.

## 2015-05-26 NOTE — Progress Notes (Signed)
Spoke with infusion nurse assigned to pt to notify pt that doppler scheduled for 9am tomorrow morning.

## 2015-05-26 NOTE — Patient Instructions (Signed)

## 2015-05-26 NOTE — Progress Notes (Signed)
Patient c/o swelling in thighs and feet, spoke to Dr Benay Spice and Ned Card, and Selena Lesser, decision was made that CB would see pt.  Scheduled appt.

## 2015-05-27 ENCOUNTER — Telehealth: Payer: Self-pay | Admitting: *Deleted

## 2015-05-27 ENCOUNTER — Ambulatory Visit (HOSPITAL_COMMUNITY)
Admission: RE | Admit: 2015-05-27 | Discharge: 2015-05-27 | Disposition: A | Discharge status: Home / Self Care | Payer: BLUE CROSS/BLUE SHIELD | Source: Ambulatory Visit | Attending: Nurse Practitioner | Admitting: Nurse Practitioner

## 2015-05-27 ENCOUNTER — Ambulatory Visit (HOSPITAL_COMMUNITY): Payer: BLUE CROSS/BLUE SHIELD

## 2015-05-27 ENCOUNTER — Telehealth: Payer: Self-pay | Admitting: Nurse Practitioner

## 2015-05-27 DIAGNOSIS — R600 Localized edema: Secondary | ICD-10-CM | POA: Insufficient documentation

## 2015-05-27 NOTE — Telephone Encounter (Signed)
-----   Message from Azzie Glatter, RN sent at 05/26/2015  4:18 PM EST ----- Regarding: "1st time chemotherapy----per Dr. Benay Spice" Patient received Taxol and Cyramza for the 1st time today, per Dr. Benay Spice.  Patient tolerated treatment well with no complaints.

## 2015-05-27 NOTE — Telephone Encounter (Signed)
LM for pt to rtn call to [facility] or myself to discuss doppler

## 2015-05-27 NOTE — Progress Notes (Signed)
*  PRELIMINARY RESULTS* Vascular Ultrasound Lower extremity venous duplex has been completed.  Preliminary findings: No evidence of DVT or baker's cyst.  Called results to [facility].   Landry Mellow, RDMS, RVT  05/27/2015, 10:33 AM

## 2015-05-27 NOTE — Telephone Encounter (Signed)
Called patient for chemotherapy follow up call. Reports doing well, notes fatigue. Had one episode of vomiting early this morning, but is utilizing antiemetic medicine appropriately. Reports no BM since last week. Notes abdominal pain that is not new and responds to prescribed Roxanol. Patient stated she was told to take Miralax and use stool softener. She took one stool softener last week, and has taken Miralax day before yesterday. Reviewed appropriate use of Miralax is every day up to seven days. Also reviewed appropriate use of OTC stool softener- twice daily until BM. Encouraged patient to also increase fluid intake. Advised patient will forward information to her doctor for further orders as needed.

## 2015-05-28 ENCOUNTER — Ambulatory Visit: Payer: BLUE CROSS/BLUE SHIELD

## 2015-05-29 ENCOUNTER — Encounter: Payer: Self-pay | Admitting: Nurse Practitioner

## 2015-05-29 DIAGNOSIS — R609 Edema, unspecified: Secondary | ICD-10-CM | POA: Insufficient documentation

## 2015-05-29 DIAGNOSIS — R600 Localized edema: Secondary | ICD-10-CM | POA: Insufficient documentation

## 2015-05-29 DIAGNOSIS — E860 Dehydration: Secondary | ICD-10-CM | POA: Insufficient documentation

## 2015-05-29 NOTE — Assessment & Plan Note (Signed)
Patient admits to poor oral intake due to lack of appetite and decreased taste sensation.  Sodium level is decreased to 132, creatinine has increased to 1.2.  Patient was encouraged to push fluids at home is much as possible.  She'll also get fluids when receiving her IV chemotherapy today.

## 2015-05-29 NOTE — Progress Notes (Signed)
SYMPTOM MANAGEMENT CLINIC   HPI: Andrea Best 45 y.o. female diagnosed with gastric cancer.  Here today to initiate Taxol/cyramza chemotherapy regimen.   Patient typically has chronic bilateral lower extremity edema; with the left greater than the right.  However, patient has noticed a significant increase in the erythema to her lower extremities within the past several days.  She denies any calf pain or other new issues.  Patient will undergo a Doppler ultrasound of the bilateral lower extremities tomorrow morning to rule out DVT.  If Doppler ultrasound is negative; differential diagnosis within include hypoalbuminemia and possible venous obstruction from the tumor in her pelvis.  HPI  Review of Systems  Constitutional: Positive for malaise/fatigue.  Cardiovascular: Positive for leg swelling. Negative for orthopnea and claudication.  All other systems reviewed and are negative.   Past Medical History  Diagnosis Date  . Eczema   . Lupus (HCC)     joint pain and extreme fatique - improved after plaquenil and prednisone therapy  . Sjogren's disease ([place])     caused pt to loose her teeth due to dry mouth;  prone to eye irritation and infection due to dry eye; affects immune system - PRONE TO INFECTIONS   . Allergy   . Tachycardia   . Protein-calorie malnutrition, severe ([place])   . Abnormal findings on esophagogastroduodenoscopy (EGD) 09/22/13    esophagitis  . Asthma     no recent flare ups - no inhalers - as of 11/19/13  . GERD (gastroesophageal reflux disease)   . Anemia     iron deficiency   . Pneumonia     health care -associated July 2015 post op gastrectomy surgery  . Gastric cancer ([place]) 09/22/13 &10/10/13    adenocarcinoma/metastatic  FIRST CHEMO WAS 11/14/13 - NEXT CHEMO TREATMENT IS 11/21/13.    Past Surgical History  Procedure Laterality Date  . Cesarean section      1997  . Laparotomy N/A 10/10/2013    Procedure: SUBTOTAL GASTRECTOMY WITH GASTRIC JEJUNOSOTMY;   Surgeon: Edward Jolly, MD;  Location: [facility];  Service: General;  Laterality: N/A;  . Portacath placement Right 11/20/2013    Procedure: INSERTION PORT-A-CATH with ULTRA SOUND;  Surgeon: Adin Hector, MD;  Location: [facility];  Service: General;  Laterality: Right;  . 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    is allergic to compazine; azithromycin; sulfa antibiotics; hydrocodone-acetaminophen; and other.    Medication List       This list is accurate as of: 05/26/15 11:59 PM.  Always use your most recent med list.               ALKA SELTZER PLUS PO  Take 1 capsule  by mouth 2 (two) times daily as needed (cold symptoms).     amoxicillin-clavulanate 875-125 MG tablet  Commonly known as:  AUGMENTIN  Take 1 tablet by mouth 2 (two) times daily. For 30days     dexamethasone 2 MG tablet  Commonly  known as:  DECADRON  Take 5 tablets (10 mg) PO at 10PM the night before chemo and 5 tablets at 6AM the morning of first chemo.     DRY EYES OP  Apply 2 drops to eye daily as needed (dry eyes).     ferrous sulfate 325 (65 FE) MG tablet  Take 325 mg by mouth daily with breakfast.     hydroxychloroquine 200 MG tablet  Commonly known as:  PLAQUENIL  Take 1 tablet (200 mg total) by mouth 2 (two) times daily.     morphine 20 MG/ML concentrated solution  Commonly known as:  ROXANOL  Take 1/2 ml to 44m every 4 hours as need for pain     morphine 30 MG 12 hr tablet  Commonly known as:  MS CONTIN  Take 1 tablet (30 mg total) by mouth every 12 (twelve) hours.     ondansetron 4 MG tablet  Commonly known as:  ZOFRAN  Take 1 tablet (4 mg total) by mouth every 8 (eight) hours as needed for nausea or vomiting.     potassium chloride SA 20 MEQ tablet  Commonly known as:  K-DUR,KLOR-CON  Take 1 tablet (20 mEq total) by mouth daily.         PHYSICAL EXAMINATION  Oncology Vitals 05/26/2015 05/26/2015  Height - -  Weight - -  Weight (lbs) - -  BMI (kg/m2) - -  Temp 97.9 98.2  Pulse 100 92  Resp 18 18  SpO2 100 100  BSA (m2) - -   BP Readings from Last 2 Encounters:  05/26/15 109/62  05/26/15 126/85    Physical Exam  Constitutional: She is oriented to person, place, and time. She appears malnourished and dehydrated. She appears unhealthy. She appears cachectic.  HENT:  Head: Normocephalic and atraumatic.  Mouth/Throat: Oropharynx is clear and moist.  Eyes: Conjunctivae and EOM are normal. Pupils are equal, round, and reactive to light. Right eye exhibits no discharge. Left eye exhibits no discharge. No scleral icterus.  Neck: Normal range of motion. Neck supple. No JVD present. No tracheal deviation present. No thyromegaly present.  Cardiovascular: Regular rhythm, normal heart sounds and intact distal pulses.   Tachycardia.  Pulmonary/Chest: Effort normal and breath sounds normal. No  respiratory distress. She has no wheezes. She has no rales. She exhibits no tenderness.  Abdominal: Soft. Bowel sounds are normal. She exhibits no distension and no mass. There is no tenderness. There is no rebound and no guarding.  Musculoskeletal: Normal range of motion. She exhibits edema. She exhibits no tenderness.  Significant +3 bilateral lower extremity edema; with the left greater than right.  No calf pain.  Lymphadenopathy:    She has no cervical adenopathy.  Neurological: She is alert and oriented to person, place, and time. Gait normal.  Skin: Skin is warm and dry. No rash noted. No erythema. There is pallor.  Psychiatric: Affect normal.    LABORATORY DATA:. Appointment on 05/26/2015  Component Date Value Ref Range Status  . WBC 05/26/2015 13.6* 3.9 - 10.3 10e3/uL Final  . NEUT# 05/26/2015 10.9* 1.5 - 6.5 10e3/uL Final  . HGB 05/26/2015 9.9* 11.6 - 15.9 g/dL Final  . HCT 05/26/2015 30.6* 34.8 - 46.6 %  Final  . Platelets 05/26/2015 219  145 - 400 10e3/uL Final  . MCV 05/26/2015 81.4  79.5 - 101.0 fL Final  . MCH 05/26/2015 26.3  25.1 - 34.0 pg Final  . MCHC 05/26/2015 32.3  31.5 - 36.0 g/dL Final  . RBC 05/26/2015 3.76  3.70 - 5.45 10e6/uL Final  . RDW 05/26/2015 21.9* 11.2 - 14.5 % Final  . lymph# 05/26/2015 0.8* 0.9 - 3.3 10e3/uL Final  . MONO# 05/26/2015 1.8* 0.1 - 0.9 10e3/uL Final  . Eosinophils Absolute 05/26/2015 0.0  0.0 - 0.5 10e3/uL Final  . Basophils Absolute 05/26/2015 0.1  0.0 - 0.1 10e3/uL Final  . NEUT% 05/26/2015 80.6* 38.4 - 76.8 % Final  . LYMPH% 05/26/2015 5.9* 14.0 - 49.7 % Final  . MONO% 05/26/2015 12.9  0.0 - 14.0 % Final  . EOS% 05/26/2015 0.1  0.0 - 7.0 % Final  . BASO% 05/26/2015 0.5  0.0 - 2.0 % Final  . Sodium 05/26/2015 132* 136 - 145 mEq/L Final  . Potassium 05/26/2015 4.3  3.5 - 5.1 mEq/L Final  . Chloride 05/26/2015 103  98 - 109 mEq/L Final  . CO2 05/26/2015 22  22 - 29 mEq/L Final  . Glucose 05/26/2015 99  70 - 140 mg/dl Final    Glucose reference range is for nonfasting patients. Fasting glucose reference range is 70- 100.  Marland Kitchen BUN 05/26/2015 19.6  7.0 - 26.0 mg/dL Final  . Creatinine 05/26/2015 1.2* 0.6 - 1.1 mg/dL Final  . Total Bilirubin 05/26/2015 0.76  0.20 - 1.20 mg/dL Final  . Alkaline Phosphatase 05/26/2015 146  40 - 150 U/L Final  . AST 05/26/2015 49* 5 - 34 U/L Final  . ALT 05/26/2015 16  0 - 55 U/L Final  . Total Protein 05/26/2015 6.6  6.4 - 8.3 g/dL Final  . Albumin 05/26/2015 1.6* 3.5 - 5.0 g/dL Final  . Calcium 05/26/2015 7.5* 8.4 - 10.4 mg/dL Final  . Anion Gap 05/26/2015 7  3 - 11 mEq/L Final  . EGFR 05/26/2015 63* >90 ml/min/1.73 m2 Final   eGFR is calculated using the CKD-EPI Creatinine Equation (2009)  . Protein, ur 05/26/2015 30  Negative- <30 mg/dL Final     RADIOGRAPHIC STUDIES: No results found.  ASSESSMENT/PLAN:    Peripheral edema Patient typically has chronic bilateral lower extremity edema; with the left greater than the right.  However, patient has noticed a significant increase in the erythema to her lower extremities within the past several days.  She denies any calf pain or other new issues.  Patient will undergo a Doppler ultrasound of the bilateral lower extremities tomorrow morning to rule out DVT.  If Doppler ultrasound is negative; differential diagnosis within include hypoalbuminemia and possible venous obstruction from the tumor in her pelvis.    Hypoalbuminemia Albumin down to 1.6.  Pt is experiencing increased bilat lower extremity edema. Pt was encouraged to push protein in her diet.   Gastric cancer ([place]) Pt presented to the cancer center today for cycle 1, day 1 of her new taxol/cyramza chemo regimen.  Her most recent restaging scan obtained on 04/28/15 revealed progression of dz in the pelvic masses and anterior abdominal wall mass.  Blood counts were essentially within normal limits today.  Patient has plans to return on 06/02/2015 for lab, flush, visit, and her  next cycle of chemotherapy.  Cancer associated pain Patient states that her pain is under control.  She takes MS Contin 30 mg twice daily.  Uses the morphine concentrated  drops for breakthrough pain.  She does not need any refills at this time.  Dehydration Patient admits to poor oral intake due to lack of appetite and decreased taste sensation.  Sodium level is decreased to 132, creatinine has increased to 1.2.  Patient was encouraged to push fluids at home is much as possible.  She'll also get fluids when receiving her IV chemotherapy today.   Patient stated understanding of all instructions; and was in agreement with this plan of care. The patient knows to call the clinic with any problems, questions or concerns.   Review/collaboration with Dr. Benay Spice regarding all aspects of patient's visit today.   Total time spent with patient was 40 minutes;  with greater than 75 percent of that time spent in face to face counseling regarding patient's symptoms,  and coordination of care and follow up.  Disclaimer:This dictation was prepared with Dragon/digital dictation along with [employer]. Any transcriptional errors that result from this process are unintentional.  Drue Second, NP 05/29/2015

## 2015-05-29 NOTE — Assessment & Plan Note (Signed)
Patient states that her pain is under control.  She takes MS Contin 30 mg twice daily.  Uses the morphine concentrated drops for breakthrough pain.  She does not need any refills at this time.

## 2015-05-29 NOTE — Assessment & Plan Note (Signed)
Pt presented to the cancer center today for cycle 1, day 1 of her new taxol/cyramza chemo regimen.  Her most recent restaging scan obtained on 04/28/15 revealed progression of dz in the pelvic masses and anterior abdominal wall mass.  Blood counts were essentially within normal limits today.  Patient has plans to return on 06/02/2015 for lab, flush, visit, and her next cycle of chemotherapy.

## 2015-05-29 NOTE — Progress Notes (Signed)
Late entry update: bilat doppler US of legs was negative for DVT.  Attempted to call pt to inform of doppler results; but no answer.

## 2015-05-29 NOTE — Assessment & Plan Note (Signed)
Patient typically has chronic bilateral lower extremity edema; with the left greater than the right.  However, patient has noticed a significant increase in the erythema to her lower extremities within the past several days.  She denies any calf pain or other new issues.  Patient will undergo a Doppler ultrasound of the bilateral lower extremities tomorrow morning to rule out DVT.  If Doppler ultrasound is negative; differential diagnosis within include hypoalbuminemia and possible venous obstruction from the tumor in her pelvis.

## 2015-05-29 NOTE — Assessment & Plan Note (Signed)
Albumin down to 1.6.  Pt is experiencing increased bilat lower extremity edema. Pt was encouraged to push protein in her diet.

## 2015-05-30 ENCOUNTER — Other Ambulatory Visit: Payer: Self-pay | Admitting: Oncology

## 2015-05-31 ENCOUNTER — Telehealth: Payer: Self-pay | Admitting: *Deleted

## 2015-05-31 NOTE — Telephone Encounter (Signed)
On 05-31-15 fax medical records to [facility] it was consult note, follow up note, sim & planning note, end of tx note,

## 2015-06-01 ENCOUNTER — Telehealth: Payer: Self-pay | Admitting: Oncology

## 2015-06-01 ENCOUNTER — Telehealth: Payer: Self-pay | Admitting: *Deleted

## 2015-06-01 ENCOUNTER — Other Ambulatory Visit: Payer: Self-pay | Admitting: *Deleted

## 2015-06-01 DIAGNOSIS — C163 Malignant neoplasm of pyloric antrum: Secondary | ICD-10-CM

## 2015-06-01 NOTE — Telephone Encounter (Signed)
Faxed records to [facility] per request [id number] release id

## 2015-06-01 NOTE — Telephone Encounter (Signed)
On 06-01-15 went down to send out [employer] envelope with a cd. The tracking # [id number]

## 2015-06-02 ENCOUNTER — Telehealth: Payer: Self-pay | Admitting: Nurse Practitioner

## 2015-06-02 ENCOUNTER — Other Ambulatory Visit: Payer: Self-pay | Admitting: Nurse Practitioner

## 2015-06-02 ENCOUNTER — Ambulatory Visit: Payer: BLUE CROSS/BLUE SHIELD

## 2015-06-02 ENCOUNTER — Other Ambulatory Visit: Payer: BLUE CROSS/BLUE SHIELD

## 2015-06-02 ENCOUNTER — Telehealth: Payer: Self-pay | Admitting: *Deleted

## 2015-06-02 ENCOUNTER — Ambulatory Visit: Payer: BLUE CROSS/BLUE SHIELD | Admitting: Nurse Practitioner

## 2015-06-02 ENCOUNTER — Telehealth: Payer: Self-pay | Admitting: Oncology

## 2015-06-02 DIAGNOSIS — C163 Malignant neoplasm of pyloric antrum: Secondary | ICD-10-CM

## 2015-06-02 NOTE — Telephone Encounter (Signed)
Received call from patient's mother, Mardene Celeste, stating patient is not feeling well and would like to reschedule; "she just wants to rest today, and she doesn't feel like coming up there in an ambulance".  Reports pt's BP is 92/51, HR ranging from 107-115, reports patient not drinking well.  I encouraged mother that pt could be dehydrated, and if she is not feeling well she should be seen. However, pt still refusing to come for today's appt and requesting all appts be rescheduled. Instructed mother if patient's condition worsens to call 911, or if patient changes her mind about being seen in the office today, to call us back. She voices understanding.   Message routed to Ned Card and Santiago Glad LPN. POF sent URGENT to scheduler to reschedule appts.

## 2015-06-02 NOTE — Telephone Encounter (Signed)
Per staff phone call and POF I have schedueld appts. Scheduler advised of appts.  JMW  

## 2015-06-02 NOTE — Telephone Encounter (Signed)
Follow up call to Pt. To inquire about how she is feeling. Pt. Stated "She was feeling a little better." Asked her about her blood pressure from earlier," She stated her mother took her blood pressure again and it was in the 100's . Encouraged her to come in for some fluids tomorrow, told her I was concerned she could be dehydrated. " She stated that she would come in tomorrow". thanked me for the follow up call. Appointment made for [facility] per Selena Lesser NP for tomorrow 06/03/15

## 2015-06-02 NOTE — Telephone Encounter (Signed)
sw,. pt and advised on tomorrows appt....pt ok and aware

## 2015-06-02 NOTE — Telephone Encounter (Signed)
As per Owens Shark call placed to Pt. ,Appointment made for [facility]. For 06/03/15

## 2015-06-02 NOTE — Telephone Encounter (Signed)
per pof to sch pt appt-cld pt and gave pt time & date of appt r/s for 3/10

## 2015-06-03 ENCOUNTER — Other Ambulatory Visit (HOSPITAL_BASED_OUTPATIENT_CLINIC_OR_DEPARTMENT_OTHER): Payer: BLUE CROSS/BLUE SHIELD

## 2015-06-03 ENCOUNTER — Ambulatory Visit (HOSPITAL_BASED_OUTPATIENT_CLINIC_OR_DEPARTMENT_OTHER): Payer: BLUE CROSS/BLUE SHIELD | Admitting: Nurse Practitioner

## 2015-06-03 ENCOUNTER — Encounter: Payer: Self-pay | Admitting: Nurse Practitioner

## 2015-06-03 ENCOUNTER — Ambulatory Visit: Payer: BLUE CROSS/BLUE SHIELD

## 2015-06-03 VITALS — BP 103/63 | HR 96 | Temp 97.0°F | Resp 18 | Wt 139.2 lb

## 2015-06-03 DIAGNOSIS — E43 Unspecified severe protein-calorie malnutrition: Secondary | ICD-10-CM | POA: Diagnosis not present

## 2015-06-03 DIAGNOSIS — I959 Hypotension, unspecified: Secondary | ICD-10-CM

## 2015-06-03 DIAGNOSIS — T451X5A Adverse effect of antineoplastic and immunosuppressive drugs, initial encounter: Secondary | ICD-10-CM

## 2015-06-03 DIAGNOSIS — R188 Other ascites: Secondary | ICD-10-CM

## 2015-06-03 DIAGNOSIS — D6481 Anemia due to antineoplastic chemotherapy: Secondary | ICD-10-CM

## 2015-06-03 DIAGNOSIS — C162 Malignant neoplasm of body of stomach: Secondary | ICD-10-CM

## 2015-06-03 DIAGNOSIS — R609 Edema, unspecified: Secondary | ICD-10-CM

## 2015-06-03 DIAGNOSIS — R531 Weakness: Secondary | ICD-10-CM | POA: Diagnosis not present

## 2015-06-03 DIAGNOSIS — G893 Neoplasm related pain (acute) (chronic): Secondary | ICD-10-CM

## 2015-06-03 DIAGNOSIS — C163 Malignant neoplasm of pyloric antrum: Secondary | ICD-10-CM

## 2015-06-03 DIAGNOSIS — Z95828 Presence of other vascular implants and grafts: Secondary | ICD-10-CM

## 2015-06-03 DIAGNOSIS — E860 Dehydration: Secondary | ICD-10-CM

## 2015-06-03 DIAGNOSIS — R630 Anorexia: Secondary | ICD-10-CM | POA: Diagnosis not present

## 2015-06-03 LAB — COMPREHENSIVE METABOLIC PANEL
ALK PHOS: 102 U/L (ref 40–150)
ALT: 24 U/L (ref 0–55)
ANION GAP: 5 meq/L (ref 3–11)
AST: 65 U/L — ABNORMAL HIGH (ref 5–34)
Albumin: 1.6 g/dL — ABNORMAL LOW (ref 3.5–5.0)
BUN: 8.7 mg/dL (ref 7.0–26.0)
CALCIUM: 7.5 mg/dL — AB (ref 8.4–10.4)
CO2: 24 mEq/L (ref 22–29)
Chloride: 103 mEq/L (ref 98–109)
Creatinine: 0.8 mg/dL (ref 0.6–1.1)
Glucose: 98 mg/dl (ref 70–140)
POTASSIUM: 3.6 meq/L (ref 3.5–5.1)
Sodium: 132 mEq/L — ABNORMAL LOW (ref 136–145)
Total Bilirubin: 0.62 mg/dL (ref 0.20–1.20)
Total Protein: 6.3 g/dL — ABNORMAL LOW (ref 6.4–8.3)

## 2015-06-03 LAB — CBC WITH DIFFERENTIAL/PLATELET
BASO%: 0.6 % (ref 0.0–2.0)
BASOS ABS: 0 10*3/uL (ref 0.0–0.1)
EOS%: 0.3 % (ref 0.0–7.0)
Eosinophils Absolute: 0 10*3/uL (ref 0.0–0.5)
HEMATOCRIT: 26.2 % — AB (ref 34.8–46.6)
HEMOGLOBIN: 8.7 g/dL — AB (ref 11.6–15.9)
LYMPH#: 0.6 10*3/uL — AB (ref 0.9–3.3)
LYMPH%: 16.3 % (ref 14.0–49.7)
MCH: 27.3 pg (ref 25.1–34.0)
MCHC: 33.2 g/dL (ref 31.5–36.0)
MCV: 82.1 fL (ref 79.5–101.0)
MONO#: 0.6 10*3/uL (ref 0.1–0.9)
MONO%: 15.5 % — ABNORMAL HIGH (ref 0.0–14.0)
NEUT#: 2.4 10*3/uL (ref 1.5–6.5)
NEUT%: 67.3 % (ref 38.4–76.8)
PLATELETS: 124 10*3/uL — AB (ref 145–400)
RBC: 3.19 10*6/uL — ABNORMAL LOW (ref 3.70–5.45)
RDW: 19.4 % — ABNORMAL HIGH (ref 11.2–14.5)
WBC: 3.6 10*3/uL — ABNORMAL LOW (ref 3.9–10.3)
nRBC: 0 % (ref 0–0)

## 2015-06-03 LAB — TECHNOLOGIST REVIEW

## 2015-06-03 MED ORDER — SODIUM CHLORIDE 0.9% FLUSH
10.0000 mL | INTRAVENOUS | Status: DC | PRN
  Administered 2015-06-03: 10 mL via INTRAVENOUS
  Filled 2015-06-03: qty 10

## 2015-06-03 MED ORDER — HEPARIN SOD (PORK) LOCK FLUSH 100 UNIT/ML IV SOLN
500.0000 [IU] | Freq: Once | INTRAVENOUS | Status: AC
  Administered 2015-06-03: 500 [IU] via INTRAVENOUS
  Filled 2015-06-03: qty 5

## 2015-06-03 MED ORDER — SODIUM CHLORIDE 0.9 % IV SOLN
INTRAVENOUS | Status: AC
  Administered 2015-06-03: 16:00:00 via INTRAVENOUS

## 2015-06-03 MED ORDER — SODIUM CHLORIDE 0.9% FLUSH
10.0000 mL | INTRAVENOUS | Status: AC | PRN
  Filled 2015-06-03: qty 10

## 2015-06-03 NOTE — Assessment & Plan Note (Signed)
Patient admits to very little appetite and poor oral intake.  She feels dehydrated today.  She will receive IV fluid rehydration while the cancer Center.  She was also encouraged to push fluids and protein at home if at all possible.  She was encouraged to also drink boost or ensure as well.

## 2015-06-03 NOTE — Assessment & Plan Note (Addendum)
Patient received her first cycle of Taxol/Cyramza chemotherapy regimen on 05/26/2015.  She reports increasing fatigue, weakness, minimal appetite, dehydration, and low blood pressure.  She also feels that her ascites has increased as well.  She denies any recent fevers or chills.  Patient presented to the [facility] today to receive IV fluid rehydration for dehydration and hypotension.  Dr. Benay Spice in to review all with patient as well today.  Dr. Benay Spice discussed options of continuing chemotherapy versus consideration of hospice care at home.  Patient has elected to proceed with chemotherapy.  Patient will return tomorrow to receive her second cycle of chemotherapy.  Note: Patient stated that she and her mother plan to fly to [place], [place] this coming Monday, 06/07/2015 for a consult with cancer centers of [place].

## 2015-06-03 NOTE — Progress Notes (Unsigned)
Patient requested labs peripherally. Will cx flush appt.

## 2015-06-03 NOTE — Assessment & Plan Note (Signed)
Patient states she's had minimal appetite and very poor oral intake recently.  She feels dehydrated.  Blood pressure was down to 91/54 while at the cancer Center today.  Confirmed the patient does not take any blood pressure medications.  Whatsoever.  Most likely, patient's hypotension is secondary to significant dehydration.  Patient will receive IV fluid rehydration today.  She was also encouraged to push fluids is much as possible.

## 2015-06-03 NOTE — Assessment & Plan Note (Signed)
Patient continues with bilateral lower extremity edema; with the left lower extremity much larger than the right.  Bilateral Doppler ultrasound obtained on 05/27/2015 was negative for DVT.  Most likely, edema is secondary to low albumin and/or tumor burden and abdominal area.  Patient was encouraged to elevate her legs above the level of her heart whenever possible.  She has also tried compression stockings; but feels they're too tight for her legs.

## 2015-06-03 NOTE — Assessment & Plan Note (Signed)
Hemoglobin has decreased to 8.7.  Most likely, this is secondary to patient's recent chemotherapy.  Patient continues to complain of increased fatigue; but denies any worsening issues with shortness of breath with exertion.

## 2015-06-03 NOTE — Assessment & Plan Note (Signed)
Patient albumin.  Her main slow at 1.6.  Patient continues with peripheral edema; the left lower extremity greater than the right.  Bilateral Doppler ultrasounds of the lower extremities was negative for DVT.  Most likely, continued, chronic peripheral edema is secondary to both low albumin and lymphadenopathy secondary to abdominal tumor burden.

## 2015-06-03 NOTE — Assessment & Plan Note (Signed)
Patient reports increased abdominal distention and firmness.  She feels increasingly bloated as well.  Most recent restaging scan.  Did confirm increased abdominal tumor burden.  Exam today revealed mild to moderate abdominal distention and some firmness.  Patient also has some mild generalized abdominal tenderness with palpation.

## 2015-06-03 NOTE — Progress Notes (Signed)
SYMPTOM MANAGEMENT CLINIC   HPI: Andrea Best 45 y.o. female diagnosed with gastric cancer.  Currently undergoing Taxol/Cyramza chemotherapy regimen.  Patient presents to the [facility] today with complaint of decreased appetite, poor oral intake, dehydration, and hypotension.  She's also complaining of increased fatigue.  She denies any URI or UTI symptoms.  She denies any recent fevers or chills.   HPI  Review of Systems  Constitutional: Positive for malaise/fatigue.  Gastrointestinal: Positive for nausea and abdominal pain. Negative for heartburn, vomiting, diarrhea, constipation, blood in stool and melena.       Increased abdominal distention.  Neurological: Positive for weakness.  All other systems reviewed and are negative.   Past Medical History  Diagnosis Date  . Eczema   . Lupus (HCC)     joint pain and extreme fatique - improved after plaquenil and prednisone therapy  . Sjogren's disease ([place])     caused pt to loose her teeth due to dry mouth;  prone to eye irritation and infection due to dry eye; affects immune system - PRONE TO INFECTIONS   . Allergy   . Tachycardia   . Protein-calorie malnutrition, severe ([place])   . Abnormal findings on esophagogastroduodenoscopy (EGD) 09/22/13    esophagitis  . Asthma     no recent flare ups - no inhalers - as of 11/19/13  . GERD (gastroesophageal reflux disease)   . Anemia     iron deficiency   . Pneumonia     health care -associated July 2015 post op gastrectomy surgery  . 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    Past Surgical History  Procedure Laterality Date  . Cesarean section      1997  . Laparotomy N/A 10/10/2013    Procedure: SUBTOTAL GASTRECTOMY WITH GASTRIC JEJUNOSOTMY;  Surgeon: Edward Jolly, MD;  Location: [facility];  Service: General;  Laterality: N/A;  . Portacath placement Right 11/20/2013    Procedure:  INSERTION PORT-A-CATH with ULTRA SOUND;  Surgeon: Adin Hector, MD;  Location: [facility];  Service: General;  Laterality: Right;  . 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[place]); URI (upper respiratory infection); Hand foot syndrome; Neuropathy due to chemotherapeutic drug ([place]); Anorexia; Weight loss; Diarrhea; Arterial hypotension; Biliary obstruction; Malnutrition of moderate degree; Bacteremia; E coli bacteremia; Bacteremia due to Klebsiella pneumoniae; 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    is allergic to compazine; azithromycin; sulfa antibiotics; hydrocodone-acetaminophen; and other.    Medication List       This list is accurate as of: 06/03/15  5:24 PM.  Always use your most recent med list.               ALKA SELTZER PLUS PO  Take 1 capsule by mouth 2 (two) times daily as needed (cold symptoms).     amoxicillin-clavulanate 875-125  MG tablet  Commonly known as:  AUGMENTIN  Take 1 tablet by mouth 2 (two) times daily. For 30days     dexamethasone 2 MG tablet  Commonly known as:  DECADRON  Take 5 tablets (10 mg) PO at 10PM the night before chemo and 5  tablets at 6AM the morning of first chemo.     DRY EYES OP  Apply 2 drops to eye daily as needed (dry eyes).     ferrous sulfate 325 (65 FE) MG tablet  Take 325 mg by mouth daily with breakfast.     hydroxychloroquine 200 MG tablet  Commonly known as:  PLAQUENIL  Take 1 tablet (200 mg total) by mouth 2 (two) times daily.     morphine 20 MG/ML concentrated solution  Commonly known as:  ROXANOL  Take 1/2 ml to 39m every 4 hours as need for pain     morphine 30 MG 12 hr tablet  Commonly known as:  MS CONTIN  Take 1 tablet (30 mg total) by mouth every 12 (twelve) hours.     ondansetron 4 MG tablet  Commonly known as:  ZOFRAN  Take 1 tablet (4 mg total) by mouth every 8 (eight) hours as needed for nausea or vomiting.     potassium chloride SA 20 MEQ tablet  Commonly known as:  K-DUR,KLOR-CON  Take 1 tablet (20 mEq total) by mouth daily.         PHYSICAL EXAMINATION  Oncology Vitals 06/03/2015 05/26/2015  Height - -  Weight 63.141 kg -  Weight (lbs) 139 lbs 3 oz -  BMI (kg/m2) - -  Temp 99.4 97.9  Pulse 100 100  Resp 18 18  SpO2 100 100  BSA (m2) - -   BP Readings from Last 2 Encounters:  06/03/15 91/54  05/26/15 109/62    Physical Exam  Constitutional: She is oriented to person, place, and time. She appears malnourished and dehydrated. She appears unhealthy. She appears cachectic.  Patient appears progressively fatigued, weak, frail, and chronically ill.  HENT:  Head: Normocephalic and atraumatic.  Mouth/Throat: Oropharynx is clear and moist.  Eyes: Conjunctivae and EOM are normal. Pupils are equal, round, and reactive to light. Right eye exhibits no discharge. Left eye exhibits no discharge. No scleral icterus.  Neck: Normal range of motion.  Pulmonary/Chest: Effort normal. No respiratory distress.  Abdominal: Bowel sounds are normal. She exhibits distension. She exhibits no mass. There is tenderness. There is no rebound and no guarding.  Mild to moderate abdominal  distention and mild firmness noted.  Patient has generalized abdominal tenderness with palpation.  No rebound tenderness.  Bowel sounds positive in all 4 quads.  Musculoskeletal: Normal range of motion. She exhibits no edema or tenderness.  Neurological: She is alert and oriented to person, place, and time. Gait normal.  Skin: Skin is warm and dry. No rash noted. No erythema. There is pallor.  Psychiatric: Affect normal.    LABORATORY DATA:. Appointment on 06/03/2015  Component Date Value Ref Range Status  . WBC 06/03/2015 3.6* 3.9 - 10.3 10e3/uL Final  . NEUT# 06/03/2015 2.4  1.5 - 6.5 10e3/uL Final  . HGB 06/03/2015 8.7* 11.6 - 15.9 g/dL Final  . HCT 06/03/2015 26.2* 34.8 - 46.6 % Final  . Platelets 06/03/2015 124* 145 - 400 10e3/uL Final  . MCV 06/03/2015 82.1  79.5 - 101.0 fL Final  . MCH 06/03/2015 27.3  25.1 - 34.0 pg Final  . MCHC 06/03/2015 33.2  31.5 - 36.0 g/dL  Final  . RBC 06/03/2015 3.19* 3.70 - 5.45 10e6/uL Final  . RDW 06/03/2015 19.4* 11.2 - 14.5 % Final  . lymph# 06/03/2015 0.6* 0.9 - 3.3 10e3/uL Final  . MONO# 06/03/2015 0.6  0.1 - 0.9 10e3/uL Final  . Eosinophils Absolute 06/03/2015 0.0  0.0 - 0.5 10e3/uL Final  . Basophils Absolute 06/03/2015 0.0  0.0 - 0.1 10e3/uL Final  . NEUT% 06/03/2015 67.3  38.4 - 76.8 % Final  . LYMPH% 06/03/2015 16.3  14.0 - 49.7 % Final  . MONO% 06/03/2015 15.5* 0.0 - 14.0 % Final  . EOS% 06/03/2015 0.3  0.0 - 7.0 % Final  . BASO% 06/03/2015 0.6  0.0 - 2.0 % Final  . nRBC 06/03/2015 0  0 - 0 % Final  . Sodium 06/03/2015 132* 136 - 145 mEq/L Final  . Potassium 06/03/2015 3.6  3.5 - 5.1 mEq/L Final  . Chloride 06/03/2015 103  98 - 109 mEq/L Final  . CO2 06/03/2015 24  22 - 29 mEq/L Final  . Glucose 06/03/2015 98  70 - 140 mg/dl Final   Glucose reference range is for nonfasting patients. Fasting glucose reference range is 70- 100.  Marland Kitchen BUN 06/03/2015 8.7  7.0 - 26.0 mg/dL Final  . Creatinine 06/03/2015 0.8  0.6 - 1.1 mg/dL Final  . Total  Bilirubin 06/03/2015 0.62  0.20 - 1.20 mg/dL Final  . Alkaline Phosphatase 06/03/2015 102  40 - 150 U/L Final  . AST 06/03/2015 65* 5 - 34 U/L Final  . ALT 06/03/2015 24  0 - 55 U/L Final  . Total Protein 06/03/2015 6.3* 6.4 - 8.3 g/dL Final  . Albumin 06/03/2015 1.6* 3.5 - 5.0 g/dL Final  . Calcium 06/03/2015 7.5* 8.4 - 10.4 mg/dL Final  . Anion Gap 06/03/2015 5  3 - 11 mEq/L Final  . EGFR 06/03/2015 >90  >90 ml/min/1.73 m2 Final   eGFR is calculated using the CKD-EPI Creatinine Equation (2009)  . Technologist Review 06/03/2015 Few teardrops and fragments, slight toxic granulation    Final     RADIOGRAPHIC STUDIES: No results found.  ASSESSMENT/PLAN:    Weakness Patient reports increased generalized fatigue and weakness within the past few weeks.  She has had minimal appetite and poor oral intake as well.  Most likely, patient's increased fatigue and weakness is secondary to patient's cancer diagnosis, recent chemotherapy, dehydration, and deconditioning.  Patient was encouraged to push fluids is much as possible.  Protein-calorie malnutrition, severe ([place]) Patient albumin.  Her main slow at 1.6.  Patient continues with peripheral edema; the left lower extremity greater than the right.  Bilateral Doppler ultrasounds of the lower extremities was negative for DVT.  Most likely, continued, chronic peripheral edema is secondary to both low albumin and lymphadenopathy secondary to abdominal tumor burden.  Peripheral edema Patient continues with bilateral lower extremity edema; with the left lower extremity much larger than the right.  Bilateral Doppler ultrasound obtained on 05/27/2015 was negative for DVT.  Most likely, edema is secondary to low albumin and/or tumor burden and abdominal area.  Patient was encouraged to elevate her legs above the level of her heart whenever possible.  She has also tried compression stockings; but feels they're too tight for her legs.  Dehydration Patient  admits to minimal appetite and very poor oral intake.  She feels dehydrated today.  Sodium was 132.  Patient received approximately 1 L normal saline IV fluid rehydration while at the cancer Center today.  She was encouraged to push fluids  at home as well.  Cancer associated pain Patient continues to experience chronic pain; states that she needs no refills of her pain medications at this time.  Ascites Patient reports increased abdominal distention and firmness.  She feels increasingly bloated as well.  Most recent restaging scan.  Did confirm increased abdominal tumor burden.  Exam today revealed mild to moderate abdominal distention and some firmness.  Patient also has some mild generalized abdominal tenderness with palpation.  Antineoplastic chemotherapy induced anemia Hemoglobin has decreased to 8.7.  Most likely, this is secondary to patient's recent chemotherapy.  Patient continues to complain of increased fatigue; but denies any worsening issues with shortness of breath with exertion.  Anorexia Patient admits to very little appetite and poor oral intake.  She feels dehydrated today.  She will receive IV fluid rehydration while the cancer Center.  She was also encouraged to push fluids and protein at home if at all possible.  She was encouraged to also drink boost or ensure as well.  Hypotension Patient states she's had minimal appetite and very poor oral intake recently.  She feels dehydrated.  Blood pressure was down to 91/54 while at the cancer Center today.  Confirmed the patient does not take any blood pressure medications.  Whatsoever.  Most likely, patient's hypotension is secondary to significant dehydration.  Patient will receive IV fluid rehydration today.  She was also encouraged to push fluids is much as possible.  Gastric cancer [facility]) Patient received her first cycle of Taxol/Cyramza chemotherapy regimen on 05/26/2015.  She reports increasing fatigue, weakness, minimal  appetite, dehydration, and low blood pressure.  She also feels that her ascites has increased as well.  She denies any recent fevers or chills.  Patient presented to the [facility] today to receive IV fluid rehydration for dehydration and hypotension.  Dr. Benay Spice in to review all with patient as well today.  Dr. Benay Spice discussed options of continuing chemotherapy versus consideration of hospice care at home.  Patient has elected to proceed with chemotherapy.  Patient will return tomorrow to receive her second cycle of chemotherapy.  Note: Patient stated that she and her mother plan to fly to [place], [place] this coming Monday, 06/07/2015 for a consult with cancer centers of [place].   Patient stated understanding of all instructions; and was in agreement with this plan of care. The patient knows to call the clinic with any problems, questions or concerns.   This was a shared visit with Dr. Benay Spice today.  Total time spent with patient was 40 minutes;  with greater than 75 percent of that time spent in face to face counseling regarding patient's symptoms,  and coordination of care and follow up.  Disclaimer:This dictation was prepared with Dragon/digital dictation along with [employer]. Any transcriptional errors that result from this process are unintentional.  Drue Second, NP 06/03/2015   This was a shared visit with Drue Second. Ms. Marschke was interviewed and examined. Her performance status has declined. I suspect her poor oral intake is related to progression of the gastric cancer. We discussed hospice care. She would like to continue systemic therapy. She is scheduled for another treatment with Taxol on 06/04/2015. She plans to go for a second opinion to the [facility] on 06/07/2015.  Julieanne Manson, M.D.

## 2015-06-03 NOTE — Patient Instructions (Signed)

## 2015-06-03 NOTE — Assessment & Plan Note (Signed)
Patient continues to experience chronic pain; states that she needs no refills of her pain medications at this time.

## 2015-06-03 NOTE — Assessment & Plan Note (Signed)
Patient reports increased generalized fatigue and weakness within the past few weeks.  She has had minimal appetite and poor oral intake as well.  Most likely, patient's increased fatigue and weakness is secondary to patient's cancer diagnosis, recent chemotherapy, dehydration, and deconditioning.  Patient was encouraged to push fluids is much as possible.

## 2015-06-03 NOTE — Assessment & Plan Note (Signed)
Patient admits to minimal appetite and very poor oral intake.  She feels dehydrated today.  Sodium was 132.  Patient received approximately 1 L normal saline IV fluid rehydration while at the cancer Center today.  She was encouraged to push fluids at home as well.

## 2015-06-04 ENCOUNTER — Other Ambulatory Visit: Payer: Self-pay | Admitting: Nurse Practitioner

## 2015-06-04 ENCOUNTER — Other Ambulatory Visit: Payer: BLUE CROSS/BLUE SHIELD

## 2015-06-04 ENCOUNTER — Telehealth: Payer: Self-pay | Admitting: Nurse Practitioner

## 2015-06-04 ENCOUNTER — Ambulatory Visit: Payer: BLUE CROSS/BLUE SHIELD | Admitting: Nurse Practitioner

## 2015-06-04 ENCOUNTER — Telehealth: Payer: Self-pay | Admitting: *Deleted

## 2015-06-04 ENCOUNTER — Ambulatory Visit: Payer: BLUE CROSS/BLUE SHIELD

## 2015-06-04 NOTE — Telephone Encounter (Signed)
per pof to sch pt appt-sent MW email to move trmt to coordinate w/MD appt-will call pt after reply

## 2015-06-04 NOTE — Telephone Encounter (Signed)
Was notified per the infusion room nurse that patient had not arrived for her 11:30 chemotherapy appointment.  Called patient; and she stated that she had been resting this morning.  She stated that she was feeling much better since receiving her IV fluid rehydration yesterday; but both her and her mother had made the decision to hold chemotherapy today.-In anticipation of traveling to [place], [place] on Monday, 06/07/2015 for a visit to the [facility].  Patient will need to cancel all appointments for 06/09/2015; and instead schedule labs, flush, visit, and her next chemotherapy for Friday, 06/11/2015.

## 2015-06-04 NOTE — Telephone Encounter (Signed)
Per staff message and POF I have scheduled appts. Advised scheduler of appts. JMW  

## 2015-06-07 ENCOUNTER — Telehealth: Payer: Self-pay | Admitting: Nurse Practitioner

## 2015-06-07 NOTE — Telephone Encounter (Signed)
per pof to sch pt appt-cld pt and adv of time & date of appt for 3/17@10 :15

## 2015-06-09 ENCOUNTER — Ambulatory Visit: Payer: BLUE CROSS/BLUE SHIELD

## 2015-06-09 ENCOUNTER — Other Ambulatory Visit: Payer: BLUE CROSS/BLUE SHIELD

## 2015-06-11 ENCOUNTER — Ambulatory Visit: Payer: BLUE CROSS/BLUE SHIELD | Admitting: Nurse Practitioner

## 2015-06-11 ENCOUNTER — Other Ambulatory Visit: Payer: BLUE CROSS/BLUE SHIELD

## 2015-06-11 ENCOUNTER — Telehealth: Payer: Self-pay | Admitting: *Deleted

## 2015-06-11 ENCOUNTER — Ambulatory Visit: Payer: BLUE CROSS/BLUE SHIELD

## 2015-06-11 NOTE — Telephone Encounter (Signed)
Lm for rtn call pt missed lab and follow up visit planned for today. Pt is scheduled for chemo today also.

## 2015-06-15 ENCOUNTER — Telehealth: Payer: Self-pay | Admitting: Nurse Practitioner

## 2015-06-15 NOTE — Telephone Encounter (Signed)
Patient missed her appointment here at the [facility] this past Friday, 06/11/2015.  She was scheduled for labs, flush, and her next cycle of chemotherapy tomorrow, 06/16/2015.  Have left multiple voicemail's for both patient and her mother; with no response back.  Was finally able to reach patient's mother this morning; and was advised that patient did travel down to [place], [place] for an initial consultation with cancer centers of [place].  Patient's mother states the patient did initiate her first cycle of a new chemotherapy this past week as well.  She is awaiting confirmation of possible immunotherapy as well.  She had her Port-A-Cath flushed while at the [facility] as well.  Patient's mother has confirmed that patient will need no further follow-up visits for labs, flush, or chemotherapy at the [facility] for the time being.  Advised mother that patient was welcome to return for any further needs patient may have.  Advised would be glad to provide any symptomatic care patient needs.    Pt's mother stated that they would call the [facility] if [facility] needs anything.   Also, questioned if pt's mother knew what chemo regimen pt had received- but pt's mother was unsure.   Will cancel all future appointments for the time being. Will also alert Dr. Benay Spice.

## 2015-06-16 ENCOUNTER — Other Ambulatory Visit: Payer: BLUE CROSS/BLUE SHIELD

## 2015-06-16 ENCOUNTER — Ambulatory Visit: Payer: BLUE CROSS/BLUE SHIELD

## 2015-08-16 ENCOUNTER — Emergency Department (HOSPITAL_COMMUNITY): Payer: BLUE CROSS/BLUE SHIELD

## 2015-08-16 ENCOUNTER — Inpatient Hospital Stay (HOSPITAL_COMMUNITY): Payer: BLUE CROSS/BLUE SHIELD

## 2015-08-16 ENCOUNTER — Inpatient Hospital Stay (HOSPITAL_COMMUNITY)
Admission: EM | Admit: 2015-08-16 | Discharge: 2015-08-22 | DRG: 871 | Disposition: A | Discharge status: Home Health | Payer: BLUE CROSS/BLUE SHIELD | Attending: Internal Medicine | Admitting: Internal Medicine

## 2015-08-16 ENCOUNTER — Encounter (HOSPITAL_COMMUNITY): Payer: Self-pay | Admitting: Emergency Medicine

## 2015-08-16 DIAGNOSIS — R627 Adult failure to thrive: Secondary | ICD-10-CM | POA: Diagnosis present

## 2015-08-16 DIAGNOSIS — G893 Neoplasm related pain (acute) (chronic): Secondary | ICD-10-CM | POA: Diagnosis present

## 2015-08-16 DIAGNOSIS — Z7189 Other specified counseling: Secondary | ICD-10-CM | POA: Diagnosis not present

## 2015-08-16 DIAGNOSIS — E860 Dehydration: Secondary | ICD-10-CM | POA: Diagnosis present

## 2015-08-16 DIAGNOSIS — Z833 Family history of diabetes mellitus: Secondary | ICD-10-CM

## 2015-08-16 DIAGNOSIS — D6481 Anemia due to antineoplastic chemotherapy: Secondary | ICD-10-CM | POA: Diagnosis not present

## 2015-08-16 DIAGNOSIS — Z79891 Long term (current) use of opiate analgesic: Secondary | ICD-10-CM | POA: Diagnosis not present

## 2015-08-16 DIAGNOSIS — R5081 Fever presenting with conditions classified elsewhere: Secondary | ICD-10-CM | POA: Diagnosis present

## 2015-08-16 DIAGNOSIS — D61810 Antineoplastic chemotherapy induced pancytopenia: Secondary | ICD-10-CM | POA: Diagnosis present

## 2015-08-16 DIAGNOSIS — R197 Diarrhea, unspecified: Secondary | ICD-10-CM | POA: Diagnosis not present

## 2015-08-16 DIAGNOSIS — R180 Malignant ascites: Secondary | ICD-10-CM | POA: Diagnosis present

## 2015-08-16 DIAGNOSIS — R195 Other fecal abnormalities: Secondary | ICD-10-CM | POA: Diagnosis present

## 2015-08-16 DIAGNOSIS — G92 Toxic encephalopathy: Secondary | ICD-10-CM | POA: Diagnosis present

## 2015-08-16 DIAGNOSIS — E440 Moderate protein-calorie malnutrition: Secondary | ICD-10-CM | POA: Diagnosis not present

## 2015-08-16 DIAGNOSIS — R509 Fever, unspecified: Secondary | ICD-10-CM | POA: Diagnosis present

## 2015-08-16 DIAGNOSIS — Z79899 Other long term (current) drug therapy: Secondary | ICD-10-CM | POA: Diagnosis not present

## 2015-08-16 DIAGNOSIS — D509 Iron deficiency anemia, unspecified: Secondary | ICD-10-CM | POA: Diagnosis present

## 2015-08-16 DIAGNOSIS — M329 Systemic lupus erythematosus, unspecified: Secondary | ICD-10-CM | POA: Diagnosis present

## 2015-08-16 DIAGNOSIS — D709 Neutropenia, unspecified: Secondary | ICD-10-CM | POA: Diagnosis present

## 2015-08-16 DIAGNOSIS — G9340 Encephalopathy, unspecified: Secondary | ICD-10-CM

## 2015-08-16 DIAGNOSIS — K831 Obstruction of bile duct: Secondary | ICD-10-CM | POA: Diagnosis present

## 2015-08-16 DIAGNOSIS — T451X5A Adverse effect of antineoplastic and immunosuppressive drugs, initial encounter: Secondary | ICD-10-CM | POA: Diagnosis present

## 2015-08-16 DIAGNOSIS — T40605A Adverse effect of unspecified narcotics, initial encounter: Secondary | ICD-10-CM | POA: Diagnosis present

## 2015-08-16 DIAGNOSIS — Z9221 Personal history of antineoplastic chemotherapy: Secondary | ICD-10-CM | POA: Diagnosis not present

## 2015-08-16 DIAGNOSIS — R140 Abdominal distension (gaseous): Secondary | ICD-10-CM | POA: Insufficient documentation

## 2015-08-16 DIAGNOSIS — M549 Dorsalgia, unspecified: Secondary | ICD-10-CM | POA: Diagnosis present

## 2015-08-16 DIAGNOSIS — Z515 Encounter for palliative care: Secondary | ICD-10-CM | POA: Insufficient documentation

## 2015-08-16 DIAGNOSIS — C162 Malignant neoplasm of body of stomach: Secondary | ICD-10-CM | POA: Diagnosis not present

## 2015-08-16 DIAGNOSIS — Z7952 Long term (current) use of systemic steroids: Secondary | ICD-10-CM

## 2015-08-16 DIAGNOSIS — M3500 Sicca syndrome, unspecified: Secondary | ICD-10-CM | POA: Diagnosis present

## 2015-08-16 DIAGNOSIS — Z681 Body mass index (BMI) 19 or less, adult: Secondary | ICD-10-CM

## 2015-08-16 DIAGNOSIS — C786 Secondary malignant neoplasm of retroperitoneum and peritoneum: Secondary | ICD-10-CM | POA: Diagnosis present

## 2015-08-16 DIAGNOSIS — Z8249 Family history of ischemic heart disease and other diseases of the circulatory system: Secondary | ICD-10-CM

## 2015-08-16 DIAGNOSIS — I959 Hypotension, unspecified: Secondary | ICD-10-CM | POA: Diagnosis present

## 2015-08-16 DIAGNOSIS — D61818 Other pancytopenia: Secondary | ICD-10-CM | POA: Diagnosis not present

## 2015-08-16 DIAGNOSIS — J189 Pneumonia, unspecified organism: Secondary | ICD-10-CM | POA: Diagnosis not present

## 2015-08-16 DIAGNOSIS — E43 Unspecified severe protein-calorie malnutrition: Secondary | ICD-10-CM | POA: Diagnosis present

## 2015-08-16 DIAGNOSIS — Z531 Procedure and treatment not carried out because of patient's decision for reasons of belief and group pressure: Secondary | ICD-10-CM | POA: Diagnosis present

## 2015-08-16 DIAGNOSIS — K521 Toxic gastroenteritis and colitis: Secondary | ICD-10-CM | POA: Diagnosis present

## 2015-08-16 DIAGNOSIS — A419 Sepsis, unspecified organism: Secondary | ICD-10-CM | POA: Diagnosis present

## 2015-08-16 DIAGNOSIS — C169 Malignant neoplasm of stomach, unspecified: Secondary | ICD-10-CM | POA: Diagnosis present

## 2015-08-16 DIAGNOSIS — J80 Acute respiratory distress syndrome: Secondary | ICD-10-CM | POA: Diagnosis not present

## 2015-08-16 DIAGNOSIS — C482 Malignant neoplasm of peritoneum, unspecified: Secondary | ICD-10-CM

## 2015-08-16 DIAGNOSIS — C800 Disseminated malignant neoplasm, unspecified: Secondary | ICD-10-CM | POA: Diagnosis not present

## 2015-08-16 DIAGNOSIS — J9601 Acute respiratory failure with hypoxia: Secondary | ICD-10-CM | POA: Diagnosis not present

## 2015-08-16 LAB — COMPREHENSIVE METABOLIC PANEL
ALBUMIN: 1.8 g/dL — AB (ref 3.5–5.0)
ALK PHOS: 68 U/L (ref 38–126)
ALT: 9 U/L — ABNORMAL LOW (ref 14–54)
AST: 16 U/L (ref 15–41)
Anion gap: 6 (ref 5–15)
BILIRUBIN TOTAL: 0.7 mg/dL (ref 0.3–1.2)
BUN: 9 mg/dL (ref 6–20)
CALCIUM: 7.3 mg/dL — AB (ref 8.9–10.3)
CO2: 24 mmol/L (ref 22–32)
CREATININE: 0.8 mg/dL (ref 0.44–1.00)
Chloride: 104 mmol/L (ref 101–111)
GFR calc Af Amer: >60 mL/min (ref >60)
GFR calc non Af Amer: >60 mL/min (ref >60)
GLUCOSE: 111 mg/dL — AB (ref 65–99)
Potassium: 3.3 mmol/L — ABNORMAL LOW (ref 3.5–5.1)
Sodium: 134 mmol/L — ABNORMAL LOW (ref 135–145)
TOTAL PROTEIN: 5.6 g/dL — AB (ref 6.5–8.1)

## 2015-08-16 LAB — APTT: APTT: 23 s — AB (ref 24–37)

## 2015-08-16 LAB — I-STAT CG4 LACTIC ACID, ED: LACTIC ACID, VENOUS: 2 mmol/L (ref 0.5–2.0)

## 2015-08-16 LAB — LACTIC ACID, PLASMA: Lactic Acid, Venous: 2.4 mmol/L (ref 0.5–2.0)

## 2015-08-16 LAB — CBC WITH DIFFERENTIAL/PLATELET
BASOS PCT: 0 %
Basophils Absolute: 0 10*3/uL (ref 0.0–0.1)
EOS ABS: 0 10*3/uL (ref 0.0–0.7)
Eosinophils Relative: 0 %
HEMATOCRIT: 19.4 % — AB (ref 36.0–46.0)
Hemoglobin: 6.4 g/dL — CL (ref 12.0–15.0)
Lymphocytes Relative: 58 %
Lymphs Abs: 0.3 10*3/uL — ABNORMAL LOW (ref 0.7–4.0)
MCH: 27.9 pg (ref 26.0–34.0)
MCHC: 33 g/dL (ref 30.0–36.0)
MCV: 84.7 fL (ref 78.0–100.0)
MONO ABS: 0.2 10*3/uL (ref 0.1–1.0)
Monocytes Relative: 33 %
NEUTROS PCT: 9 %
Neutro Abs: 0 10*3/uL — ABNORMAL LOW (ref 1.7–7.7)
Platelets: 70 10*3/uL — ABNORMAL LOW (ref 150–400)
RBC: 2.29 MIL/uL — ABNORMAL LOW (ref 3.87–5.11)
RDW: 21.6 % — AB (ref 11.5–15.5)
WBC: 0.5 10*3/uL — AB (ref 4.0–10.5)

## 2015-08-16 LAB — PROTIME-INR
INR: 1.31 (ref 0.00–1.49)
PROTHROMBIN TIME: 15.9 s — AB (ref 11.6–15.2)

## 2015-08-16 LAB — C DIFFICILE QUICK SCREEN W PCR REFLEX
C DIFFICILE (CDIFF) TOXIN: NEGATIVE
C Diff antigen: NEGATIVE
C Diff interpretation: NEGATIVE

## 2015-08-16 LAB — PROCALCITONIN: PROCALCITONIN: 2.79 ng/mL

## 2015-08-16 LAB — POC OCCULT BLOOD, ED: Fecal Occult Bld: POSITIVE — AB

## 2015-08-16 LAB — PHOSPHORUS: PHOSPHORUS: 2.3 mg/dL — AB (ref 2.5–4.6)

## 2015-08-16 LAB — MAGNESIUM: Magnesium: 1.7 mg/dL (ref 1.7–2.4)

## 2015-08-16 MED ORDER — VANCOMYCIN HCL IN DEXTROSE 1-5 GM/200ML-% IV SOLN
1000.0000 mg | INTRAVENOUS | Status: AC
  Administered 2015-08-16: 1000 mg via INTRAVENOUS
  Filled 2015-08-16: qty 200

## 2015-08-16 MED ORDER — SODIUM CHLORIDE 0.9 % IV BOLUS (SEPSIS)
250.0000 mL | Freq: Once | INTRAVENOUS | Status: AC
  Administered 2015-08-16: 250 mL via INTRAVENOUS

## 2015-08-16 MED ORDER — DULOXETINE HCL 30 MG PO CPEP
30.0000 mg | ORAL_CAPSULE | Freq: Every day | ORAL | Status: DC
  Administered 2015-08-17 – 2015-08-22 (×5): 30 mg via ORAL
  Filled 2015-08-16 (×6): qty 1

## 2015-08-16 MED ORDER — SODIUM CHLORIDE 0.9 % IV BOLUS (SEPSIS)
1000.0000 mL | Freq: Once | INTRAVENOUS | Status: AC
  Administered 2015-08-16: 1000 mL via INTRAVENOUS

## 2015-08-16 MED ORDER — VANCOMYCIN HCL IN DEXTROSE 750-5 MG/150ML-% IV SOLN
750.0000 mg | Freq: Two times a day (BID) | INTRAVENOUS | Status: DC
  Administered 2015-08-17 – 2015-08-20 (×7): 750 mg via INTRAVENOUS
  Filled 2015-08-16 (×8): qty 150

## 2015-08-16 MED ORDER — SODIUM CHLORIDE 0.9 % IV BOLUS (SEPSIS)
500.0000 mL | Freq: Once | INTRAVENOUS | Status: AC
  Administered 2015-08-17: 500 mL via INTRAVENOUS

## 2015-08-16 MED ORDER — TBO-FILGRASTIM 480 MCG/0.8ML ~~LOC~~ SOSY
480.0000 ug | PREFILLED_SYRINGE | Freq: Once | SUBCUTANEOUS | Status: AC
  Administered 2015-08-17: 480 ug via SUBCUTANEOUS
  Filled 2015-08-16: qty 0.8

## 2015-08-16 MED ORDER — DARBEPOETIN ALFA 100 MCG/0.5ML IJ SOSY
500.0000 ug | PREFILLED_SYRINGE | Freq: Once | INTRAMUSCULAR | Status: AC
  Administered 2015-08-17: 500 ug via SUBCUTANEOUS
  Filled 2015-08-16: qty 2.5

## 2015-08-16 MED ORDER — DIATRIZOATE MEGLUMINE & SODIUM 66-10 % PO SOLN
30.0000 mL | Freq: Once | ORAL | Status: AC
  Administered 2015-08-17: 30 mL via ORAL
  Filled 2015-08-16: qty 30

## 2015-08-16 MED ORDER — SODIUM CHLORIDE 0.9 % IV SOLN
INTRAVENOUS | Status: AC
  Administered 2015-08-16: 23:00:00 via INTRAVENOUS

## 2015-08-16 MED ORDER — PIPERACILLIN-TAZOBACTAM 3.375 G IVPB 30 MIN
3.3750 g | INTRAVENOUS | Status: AC
  Administered 2015-08-16: 3.375 g via INTRAVENOUS
  Filled 2015-08-16: qty 50

## 2015-08-16 MED ORDER — DEXTROSE 5 % IV SOLN
2.0000 g | Freq: Three times a day (TID) | INTRAVENOUS | Status: DC
  Administered 2015-08-17 – 2015-08-20 (×10): 2 g via INTRAVENOUS
  Filled 2015-08-16 (×11): qty 2

## 2015-08-16 MED ORDER — POTASSIUM & SODIUM PHOSPHATES 280-160-250 MG PO PACK
1.0000 | PACK | Freq: Three times a day (TID) | ORAL | Status: AC
  Administered 2015-08-17 (×3): 1 via ORAL
  Filled 2015-08-16 (×3): qty 1

## 2015-08-16 MED ORDER — METRONIDAZOLE IN NACL 5-0.79 MG/ML-% IV SOLN
500.0000 mg | Freq: Once | INTRAVENOUS | Status: DC
  Filled 2015-08-16: qty 100

## 2015-08-16 MED ORDER — POTASSIUM CHLORIDE CRYS ER 20 MEQ PO TBCR
20.0000 meq | EXTENDED_RELEASE_TABLET | Freq: Every day | ORAL | Status: DC
  Administered 2015-08-17 – 2015-08-20 (×4): 20 meq via ORAL
  Filled 2015-08-16 (×5): qty 1

## 2015-08-16 MED ORDER — MORPHINE SULFATE (CONCENTRATE) 10 MG/0.5ML PO SOLN
10.0000 mg | ORAL | Status: DC | PRN
  Administered 2015-08-17: 10 mg via ORAL
  Administered 2015-08-18: 20 mg via ORAL
  Administered 2015-08-19: 10 mg via ORAL
  Filled 2015-08-16: qty 0.5
  Filled 2015-08-16: qty 1
  Filled 2015-08-16: qty 0.5

## 2015-08-16 MED ORDER — SODIUM CHLORIDE 0.9 % IV BOLUS (SEPSIS)
500.0000 mL | Freq: Once | INTRAVENOUS | Status: AC
  Administered 2015-08-16: 500 mL via INTRAVENOUS

## 2015-08-16 NOTE — ED Notes (Signed)
PT aware of urine sample 

## 2015-08-16 NOTE — ED Notes (Signed)
Attempt IV and blood draw unsuccessful. Kristen at bedside to attempt at present time.

## 2015-08-16 NOTE — H&P (Signed)
Andrea Best [id number] DOB: [birth year] DOA: 08/16/2015   PCP: Aretta Nip, MD   Outpatient Specialists: [facility] [place], [facility] in [place] [phone] Dr. Barnie Alderman. [phone] Patient coming from: home  Chief Complaint: diarrhea, fever  HPI: Andrea Best is a 45 y.o. female with medical history significant of gastric cancer with gastric outlet obstruction, malnutrition, lupus, Sjogren's syndrome, chronic anemia secondary to iron deficiency as well as antineoplastic chemotherapy-induced anemia   Presented with 1 week hx of diarrhea up to 2 loose BM a day. For the past 24 hours she developed nausea, vomiting and fever Patient known history of gastric cancer was [place] chemotherapy but could not tolerate secondary to severe symptoms. EMS was called to home on their arrival blood pressure down to 80/50 temperature 100.6 patient was given a bolus 500 mL and blood pressure improved to 94/62 Patient denied any cough or shortness of breath or urinary complaints Patient denies any melena she reports history of hemorrhoids  Regarding pertinent Chronic problems:   Gastric Cancer on Taxol/Cyramaza diagnosed in 2015 due to significant progression and poor performance status primary oncologist discussed hospice care versus continuation of chemotherapy at this point patient decided to continue with chemotherapy and elected to be  seen in [place] for second opinion. Last chemotherapy was given may 11,  5FU, irinotecan,  Leucovorin,  ramuciruneb, no Neulasta was given. Labs were WBC 11.3 hg 8.06 plt 132.   The moderate to severe malnutrition, recurrent dehydration and hypotension Patient has known history of chronic lupus and Sjogren's syndrome currently well-controlled She has history of antineoplastic chemotherapy-induced anemia patient is Jehovah's Witness and refuses all blood products.  IN ER: Febrile up  to 101.3 achy currently comes 114 per tensive 92/66 WBC 0.5 ANC 0.0 , hemoglobin 6.4, platelets 70, potassium 3.3 lactic acid 2.0 Chest x-ray showing bilateral atelectasis versus infiltrate Patient was first started on Zosyn and vancomycin and was switched to cefepime she was given normal saline bolus total of 1 L 750 ml   Hospitalist was called for admission for neutropenic fever and sepsis   Review of Systems:    Pertinent positives include:  Fevers, chills, fatigue abdominal pain, nausea, vomiting, diarrhea,   Constitutional:  No weight loss, night sweats, weight loss  HEENT:  No headaches, Difficulty swallowing,Tooth/dental problems,Sore throat,  No sneezing, itching, ear ache, nasal congestion, post nasal drip,  Cardio-vascular:  No chest pain, Orthopnea, PND, anasarca, dizziness, palpitations.no Bilateral lower extremity swelling  GI:  No heartburn, indigestion,change in bowel habits, loss of appetite, melena, blood in stool, hematemesis Resp:  no shortness of breath at rest. No dyspnea on exertion, No excess mucus, no productive cough, No non-productive cough, No coughing up of blood.No change in color of mucus.No wheezing. Skin:  no rash or lesions. No jaundice GU:  no dysuria, change in color of urine, no urgency or frequency. No straining to urinate.  No flank pain.  Musculoskeletal:  No joint pain or no joint swelling. No decreased range of motion. No back pain.  Psych:  No change in mood or affect. No depression or anxiety. No memory loss.  Neuro: no localizing neurological complaints, no tingling, no weakness, no double vision, no gait abnormality, no slurred speech, no confusion  As per HPI otherwise 10 point review of systems negative.   Past Medical History: Past Medical History  Diagnosis Date  . Eczema   . Lupus ([place])  joint pain and extreme fatique - improved after plaquenil and prednisone therapy  . Sjogren's disease ([place])     caused pt to loose her  teeth due to dry mouth;  prone to eye irritation and infection due to dry eye; affects immune system - PRONE TO INFECTIONS   . Allergy   . Tachycardia   . Protein-calorie malnutrition, severe ([place])   . Abnormal findings on esophagogastroduodenoscopy (EGD) 09/22/13    esophagitis  . Asthma     no recent flare ups - no inhalers - as of 11/19/13  . GERD (gastroesophageal reflux disease)   . Anemia     iron deficiency   . Pneumonia     health care -associated July 2015 post op gastrectomy surgery  . Gastric cancer ([place]) 09/22/13 &10/10/13    adenocarcinoma/metastatic  FIRST CHEMO WAS 11/14/13 - NEXT CHEMO TREATMENT IS 11/21/13.   Past Surgical History  Procedure Laterality Date  . Cesarean section      1997  . Laparotomy N/A 10/10/2013    Procedure: SUBTOTAL GASTRECTOMY WITH GASTRIC JEJUNOSOTMY;  Surgeon: Edward Jolly, MD;  Location: [facility];  Service: General;  Laterality: N/A;  . Portacath placement Right 11/20/2013    Procedure: INSERTION PORT-A-CATH with ULTRA SOUND;  Surgeon: Adin Hector, MD;  Location: [facility];  Service: General;  Laterality: Right;  . Cystoscopy w/ ureteral stent placement Right 02/01/2015    Procedure: CYSTOSCOPY WITH RETROGRADE PYELOGRAM/URETERAL STENT PLACEMENT;  Surgeon: Festus Aloe, MD;  Location: [facility];  Service: Urology;  Laterality: Right;     Social History:  Ambulatory   independently  Lives at home  With family     reports that she has never smoked. She has never used smokeless tobacco. She reports that she does not drink alcohol or use illicit drugs.  Allergies:   Allergies  Allergen Reactions  . Compazine [Prochlorperazine Maleate] Other (See Comments)    Dystonia  . Azithromycin Rash  . Sulfa Antibiotics Rash    hallucinations  . Hydrocodone-Acetaminophen Nausea And Vomiting    Hallucinating   . 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 SHE DOES NOT WANT ANY BLOOD transfusions but will accept Albumin,        Family History:      Family History  Problem Relation Age of Onset  . Hypertension Mother   . Diabetes Mother   . Diabetes Maternal Grandmother   . Diabetes Maternal Grandfather   . HIV/AIDS Maternal Uncle   . Stomach cancer Other     MGMs sister    Medications: Prior to Admission medications   Medication Sig Start Date End Date Taking? Authorizing Provider  Artificial Tear Ointment (DRY EYES OP) Apply 2 drops to eye daily as needed (dry eyes).   Yes Historical Provider, MD  bumetanide (BUMEX) 1 MG tablet Take 1 mg by mouth daily as needed. Fluid, swelling 06/10/15  Yes Historical Provider, MD  Cholecalciferol (VITAMIN D-3 PO) Take 5 mLs by mouth daily.   Yes Historical Provider, MD  diphenoxylate-atropine (LOMOTIL) 2.5-0.025 MG tablet Take 1 tablet by mouth every 4 (four) hours as needed. diarrhea 08/13/15  Yes Historical Provider, MD  dronabinol (MARINOL) 5 MG capsule Take 5 mg by mouth every 12 (twelve) hours as needed. n/v 06/29/15  Yes Historical Provider, MD  DULoxetine (CYMBALTA) 30 MG capsule Take 30 mg by mouth daily. 07/08/15  Yes Historical Provider, MD  ferrous sulfate 325 (65 FE) MG tablet Take 325 mg by mouth 3 (  three) times a week.    Yes Historical Provider, MD  hydroxychloroquine (PLAQUENIL) 200 MG tablet Take 1 tablet (200 mg total) by mouth 2 (two) times daily. 02/12/15  Yes Ladell Pier, MD  morphine (MS CONTIN) 30 MG 12 hr tablet Take 1 tablet (30 mg total) by mouth every 12 (twelve) hours. 05/19/15  Yes Owens Shark, NP  morphine (ROXANOL) 20 MG/ML concentrated solution Take 1/2 ml to 28ml every 4 hours as need for pain 04/21/15  Yes Owens Shark, NP  ondansetron (ZOFRAN) 4 MG tablet Take 1 tablet (4 mg total) by mouth every 8 (eight) hours as needed for nausea or vomiting. 12/02/14  Yes Owens Shark, NP  OVER THE COUNTER MEDICATION Take 15 mLs by mouth daily. El- Carnitine- energy pill   Yes Historical Provider, MD  potassium chloride SA (K-DUR,KLOR-CON) 20 MEQ tablet Take 1 tablet (20 mEq  total) by mouth daily. 03/07/15  Yes Orpah Greek, MD  amoxicillin-clavulanate (AUGMENTIN) 875-125 MG tablet Take 1 tablet by mouth 2 (two) times daily. For 30days Patient not taking: Reported on 08/16/2015 03/04/15   Truman Hayward, MD  dexamethasone (DECADRON) 2 MG tablet Take 5 tablets (10 mg) PO at 10PM the night before chemo and 5 tablets at 6AM the morning of first chemo. Patient not taking: Reported on 08/16/2015 05/19/15   Ladell Pier, MD    Physical Exam: Patient Vitals for the past 24 hrs:  BP Temp Temp src Pulse Resp SpO2 Height Weight  08/16/15 1811 95/61 mmHg 98.8 F (37.1 C) Oral 97 24 99 % - -  08/16/15 1740 102/71 mmHg - - 108 24 100 % - -  08/16/15 1720 97/71 mmHg - - 111 24 100 % - -  08/16/15 1719 - - - - - - 5\' 5"  (1.651 m) 51.71 kg (114 lb)  08/16/15 1716 99/70 mmHg - - 112 18 98 % - -  08/16/15 1700 101/71 mmHg - - 110 20 97 % - -  08/16/15 1640 102/65 mmHg - - 111 22 99 % - -  08/16/15 1620 103/68 mmHg - - 114 22 98 % - -  08/16/15 1548 95/62 mmHg 101.3 F (38.5 C) Oral 107 20 97 % 5\' 6"  (1.676 m) 51.71 kg (114 lb)    1. General:  in No Acute distress, cacectic 2. Psychological: Alert and   Oriented 3. Head/ENT:    Dry Mucous Membranes                          Head Non traumatic, neck supple                           Poor Dentition 4. SKIN:   decreased Skin turgor,  Skin clean Dry and intact no rash 5. Heart: Regular rate and rhythm no Murmur, Rub or gallop 6. Lungs:   no wheezes or crackles   7. Abdomen: Soft, non-tender, Non distended 8. Lower extremities: no clubbing, cyanosis, or edema 9. Neurologically Grossly intact, moving all 4 extremities equally 10. MSK: Normal range of motion   body mass index is 18.97 kg/(m^2).  Labs on Admission:   Labs on Admission: I have personally reviewed following labs and imaging studies  CBC:  Recent Labs Lab 08/16/15 1615  WBC 0.5*  NEUTROABS 0.0*  HGB 6.4*  HCT 19.4*  MCV 84.7  PLT 70*    Basic Metabolic  Panel:  Recent Labs Lab 08/16/15 1615  NA 134*  K 3.3*  CL 104  CO2 24  GLUCOSE 111*  BUN 9  CREATININE 0.80  CALCIUM 7.3*  MG 1.7  PHOS 2.3*   GFR: Estimated Creatinine Clearance: 74.8 mL/min (by C-G formula based on Cr of 0.8). Liver Function Tests:  Recent Labs Lab 08/16/15 1615  AST 16  ALT 9*  ALKPHOS 68  BILITOT 0.7  PROT 5.6*  ALBUMIN 1.8*   No results for input(s): LIPASE, AMYLASE in the last 168 hours. No results for input(s): AMMONIA in the last 168 hours. Coagulation Profile: No results for input(s): INR, PROTIME in the last 168 hours. Cardiac Enzymes: No results for input(s): CKTOTAL, CKMB, CKMBINDEX, TROPONINI in the last 168 hours. BNP (last 3 results) No results for input(s): PROBNP in the last 8760 hours. HbA1C: No results for input(s): HGBA1C in the last 72 hours. CBG: No results for input(s): GLUCAP in the last 168 hours. Lipid Profile: No results for input(s): CHOL, HDL, LDLCALC, TRIG, CHOLHDL, LDLDIRECT in the last 72 hours. Thyroid Function Tests: No results for input(s): TSH, T4TOTAL, FREET4, T3FREE, THYROIDAB in the last 72 hours. Anemia Panel: No results for input(s): VITAMINB12, FOLATE, FERRITIN, TIBC, IRON, RETICCTPCT in the last 72 hours. Urine analysis:    Component Value Date/Time   COLORURINE YELLOW 03/07/2015 0326   APPEARANCEUR CLEAR 03/07/2015 0326   LABSPEC 1.009 03/07/2015 0326   LABSPEC 1.015 01/20/2015 1521   PHURINE 6.5 03/07/2015 0326   PHURINE 6.0 01/20/2015 1521   GLUCOSEU NEGATIVE 03/07/2015 0326   GLUCOSEU Negative 01/20/2015 1521   HGBUR NEGATIVE 03/07/2015 0326   HGBUR Negative 01/20/2015 1521   BILIRUBINUR NEGATIVE 03/07/2015 0326   BILIRUBINUR Negative 01/20/2015 1521   BILIRUBINUR NEG 06/12/2013 0937   KETONESUR NEGATIVE 03/07/2015 0326   KETONESUR Negative 01/20/2015 1521   PROTEINUR 30 05/26/2015 1338   PROTEINUR < 30 01/20/2015 1521   PROTEINUR 30 06/12/2013 0937   UROBILINOGEN  0.2 02/04/2015 1030   UROBILINOGEN 0.2 01/20/2015 1521   UROBILINOGEN 0.2 06/12/2013 0937   NITRITE NEGATIVE 03/07/2015 0326   NITRITE Negative 01/20/2015 1521   NITRITE NEG 06/12/2013 0937   LEUKOCYTESUR TRACE* 03/07/2015 0326   LEUKOCYTESUR Moderate 01/20/2015 1521   Sepsis Labs: @LABRCNTIP (procalcitonin:4,lacticidven:4) ) Recent Results (from the past 240 hour(s))  Culture, blood (Routine X 2)     Status: None (Preliminary result)   Collection Time: 08/16/15  4:13 PM  Result Value Ref Range Status   Specimen Description BLOOD LEFT FOREARM  Final   Special Requests BOTTLES DRAWN AEROBIC AND ANAEROBIC 5CC  Final   Culture PENDING  Incomplete   Report Status PENDING  Incomplete  Culture, blood (Routine X 2)     Status: None (Preliminary result)   Collection Time: 08/16/15  4:14 PM  Result Value Ref Range Status   Specimen Description BLOOD RIGHT ANTECUBITAL  Final   Special Requests BOTTLES DRAWN AEROBIC AND ANAEROBIC 5CC  Final   Culture PENDING  Incomplete   Report Status PENDING  Incomplete  C difficile quick scan w PCR reflex     Status: None   Collection Time: 08/16/15  4:53 PM  Result Value Ref Range Status   C Diff antigen NEGATIVE NEGATIVE Final   C Diff toxin NEGATIVE NEGATIVE Final   C Diff interpretation Negative for toxigenic C. difficile  Final     UA ordered  No results found for: HGBA1C  Estimated Creatinine Clearance: 74.8 mL/min (by C-G formula based on  Cr of 0.8).  BNP (last 3 results) No results for input(s): PROBNP in the last 8760 hours.   ECG REPORT  Independently reviewed Rate: 106  Rhythm: Sinus tachycardia ST&T Change: Flat T waves low voltage QTC 525  Filed Weights   08/16/15 1548 08/16/15 1719  Weight: 51.71 kg (114 lb) 51.71 kg (114 lb)     Cultures:    Component Value Date/Time   SDES BLOOD RIGHT ANTECUBITAL 08/16/2015 1614   SPECREQUEST BOTTLES DRAWN AEROBIC AND ANAEROBIC 5CC 08/16/2015 1614   CULT PENDING 08/16/2015 1614    REPTSTATUS PENDING 08/16/2015 1614     Radiological Exams on Admission: Dg Chest Port 1 View  08/16/2015  CLINICAL DATA:  Stomach cancer, diarrhea for 2 weeks, nausea, vomiting EXAM: PORTABLE CHEST 1 VIEW COMPARISON:  05/14/2015 FINDINGS: There are bilateral low lung volumes. There is bibasilar airspace disease which may reflect atelectasis versus pneumonia. There is no pleural effusion or pneumothorax. The heart and mediastinal contours are unremarkable. There is a right-sided Port-A-Cath in satisfactory position. The osseous structures are unremarkable. IMPRESSION: Low lung volumes. Bibasilar airspace disease which may reflect atelectasis versus pneumonia. Electronically Signed   By: Kathreen Devoid   On: 08/16/2015 16:59    Chart has been reviewed    Assessment/Plan  45 y.o. female with medical history significant of gastric cancer with gastric outlet obstruction, malnutrition, lupus, Sjogren's syndrome, chronic anemia secondary to iron deficiency as well as antineoplastic chemotherapy-induced anemia admitted with neutropenic fever and  sepsis     Present on Admission:  . Fever and neutropenia ([place]) - discussed with Oncology from [facility] We'll administer cefepime 2 g every 8 and vancomycin , administer Neupogen 480 today and Q day utill ANC over 1000, and darbopoetin 500, patient refuses blood products  . Gastric cancer [facility]) as per oncology consult  . Malnutrition of moderate degree - nutritional consult ordered prealbumin  . Antineoplastic chemotherapy induced anemia- administer Darbepoetin, patietn refuses blood products   . Sepsis ([place]) Admit per Sepsis protocol likely source being pneumonia given abnormal CXR. But will order abd CT  - rehydrate with 48ml/kg  - initiate broad spectrum antibiotics  Vancomycin and Cafepim  -  obtain blood cultures  - Obtain serial lactic acid  - Obtain procalcitonin level  - Admit and monitor vital signs closely    . Diarrhea -  c.dif neg, likely side effect of chemo . Dehydration - rehydrate . Occult blood in stools - pt has hx of Hemorrhoids, no melena or hx of blood in stool . Not a candidate for any GI procedures at this point given pancytopenia  . Pancytopenia ([place]) most likely secondary to chemotherapy acne administer Neupogen and darbepoetin   Other plan as per orders.  DVT prophylaxis:  SCD   Code Status:  FULL CODE  as per patient   Family Communication:   Family   at  Bedside  plan of care was discussed with   Mother Shavante Birchard [phone]  Disposition Plan:  To home once workup is complete and patient is stable   Consults called: Hem/ONC, spoke to DR. Bont  Admission status:   Inpatient     Level of care      Tele      I have spent a total of 62min on this admission   extra time was spent to discuss case With Oncology  [place] 08/16/2015, 8:37 PM    [facility]  Pager [phone]   after 2 AM please page  floor coverage PA If 7AM-7PM, please contact the day team taking care of the patient  [email or web address]  Password [email or web address]

## 2015-08-16 NOTE — ED Notes (Addendum)
Pt here from home via EMS with complaints of diarrhea x 1 week and emesis x 24 hours. Pt has a history of stomach cancer and was on her way to [facility] for chemo, but was unable to make the trip due to her symptoms. Pt was febrile for EMS at 100.6 oral. Pt was hypotensive when EMS arrived on the scene at 80/50, but after a 574ml bolus she improved to 94/62. Pt has no complaints of nausea at this time.  At time of assessment, pt has a temperature 101.3 Pt has no complaints of urinary problems or cough/difficulty breathing at this time. Pt does state that she has had problems with hemorrhoids

## 2015-08-16 NOTE — ED Notes (Signed)
The timer has been started for bed 1222

## 2015-08-16 NOTE — ED Notes (Signed)
Bed: [id number] Expected date:  Expected time:  Means of arrival:  Comments: 61F/abd pain/hypotensive/ca pt

## 2015-08-16 NOTE — ED Notes (Signed)
Primary RN made aware of patient's critical values

## 2015-08-16 NOTE — Progress Notes (Signed)
MEDICATION RELATED CONSULT NOTE - INITIAL   Pharmacy Consult for Phosphorus replacement Indication: Low phosphorus  Allergies  Allergen Reactions  . Compazine [Prochlorperazine Maleate] Other (See Comments)    Dystonia  . Azithromycin Rash  . Sulfa Antibiotics Rash    hallucinations  . Hydrocodone-Acetaminophen Nausea And Vomiting    Hallucinating   . 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 SHE DOES NOT WANT ANY BLOOD transfusions but will accept Albumin,     Patient Measurements: Height: 5\' 5"  (165.1 cm) Weight: 114 lb (51.71 kg) IBW/kg (Calculated) : 57   Vital Signs: Temp: 98.1 F (36.7 C) (05/22 1849) Temp Source: Oral (05/22 1849) BP: 92/73 mmHg (05/22 2100) Pulse Rate: 107 (05/22 2145) Intake/Output from previous day:   Intake/Output from this shift:    Labs:  Recent Labs  08/16/15 1615  WBC 0.5*  HGB 6.4*  HCT 19.4*  PLT 70*  CREATININE 0.80  MG 1.7  PHOS 2.3*  ALBUMIN 1.8*  PROT 5.6*  AST 16  ALT 9*  ALKPHOS 68  BILITOT 0.7   Estimated Creatinine Clearance: 74.8 mL/min (by C-G formula based on Cr of 0.8).   Microbiology: Recent Results (from the past 720 hour(s))  Culture, blood (Routine X 2)     Status: None (Preliminary result)   Collection Time: 08/16/15  4:13 PM  Result Value Ref Range Status   Specimen Description BLOOD LEFT FOREARM  Final   Special Requests BOTTLES DRAWN AEROBIC AND ANAEROBIC 5CC  Final   Culture PENDING  Incomplete   Report Status PENDING  Incomplete  Culture, blood (Routine X 2)     Status: None (Preliminary result)   Collection Time: 08/16/15  4:14 PM  Result Value Ref Range Status   Specimen Description BLOOD RIGHT ANTECUBITAL  Final   Special Requests BOTTLES DRAWN AEROBIC AND ANAEROBIC 5CC  Final   Culture PENDING  Incomplete   Report Status PENDING  Incomplete  C difficile quick scan w PCR reflex     Status: None   Collection Time: 08/16/15  4:53 PM  Result Value Ref Range Status   C Diff antigen NEGATIVE  NEGATIVE Final   C Diff toxin NEGATIVE NEGATIVE Final   C Diff interpretation Negative for toxigenic C. difficile  Final    Medical History: Past Medical History  Diagnosis Date  . Eczema   . Lupus ([facility])     joint pain and extreme fatique - improved after plaquenil and prednisone therapy  . Sjogren's disease ([place])     caused pt to loose her teeth due to dry mouth;  prone to eye irritation and infection due to dry eye; affects immune system - PRONE TO INFECTIONS   . Allergy   . Tachycardia   . Protein-calorie malnutrition, severe ([place])   . Abnormal findings on esophagogastroduodenoscopy (EGD) 09/22/13    esophagitis  . Asthma     no recent flare ups - no inhalers - as of 11/19/13  . GERD (gastroesophageal reflux disease)   . Anemia     iron deficiency   . Pneumonia     health care -associated July 2015 post op gastrectomy surgery  . Gastric cancer ([place]) 09/22/13 &10/10/13    adenocarcinoma/metastatic  FIRST CHEMO WAS 11/14/13 - NEXT CHEMO TREATMENT IS 11/21/13.       Assessment: 40 yoF c/o diarrhea and fever.  Phos = 2.3 and K=3.3.  Goal of Therapy:  Replace phosphorus  Plan:  Phos-NaK packet q8h x 3 doses This  will provide a total of 24 mmoles of phosphorus and 21.3 mEq of K. Pharmacy will sign off.  Lawana Pai R 08/16/2015,10:32 PM

## 2015-08-16 NOTE — ED Provider Notes (Signed)
CSN: [id number]     Arrival date & time 08/16/15  1535 History   First MD Initiated Contact with Patient 08/16/15 1619     Chief Complaint  Patient presents with  . Fever  . Emesis  . Diarrhea     (Consider location/radiation/quality/duration/timing/severity/associated sxs/prior Treatment) HPI 45 year old female with a history of gastric cancer with last chemotherapy 2 weeks ago presents with concern of nausea, vomiting, diarrhea and fever.  Fever began yesterday.  Nausea, diarrhea has been present over past week.  Diarrhea approx 2 x/day, no blood.  Emesis without blood, and approx 2-3 times per day.  Very low appetite. Abd pain unchanged from baseline.  Receives cancer care at [facility] in [place].   Past Medical History  Diagnosis Date  . Eczema   . Lupus (HCC)     joint pain and extreme fatique - improved after plaquenil and prednisone therapy  . Sjogren's disease ([place])     caused pt to loose her teeth due to dry mouth;  prone to eye irritation and infection due to dry eye; affects immune system - PRONE TO INFECTIONS   . Allergy   . Tachycardia   . Protein-calorie malnutrition, severe ([place])   . Abnormal findings on esophagogastroduodenoscopy (EGD) 09/22/13    esophagitis  . Asthma     no recent flare ups - no inhalers - as of 11/19/13  . GERD (gastroesophageal reflux disease)   . Anemia     iron deficiency   . Pneumonia     health care -associated July 2015 post op gastrectomy surgery  . 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   Past Surgical History  Procedure Laterality Date  . Cesarean section      1997  . Laparotomy N/A 10/10/2013    Procedure: SUBTOTAL GASTRECTOMY WITH GASTRIC JEJUNOSOTMY;  Surgeon: Edward Jolly, MD;  Location: [facility];  Service: General;  Laterality: N/A;  . Portacath placement Right 11/20/2013    Procedure: INSERTION PORT-A-CATH with ULTRA  SOUND;  Surgeon: Adin Hector, MD;  Location: [facility];  Service: General;  Laterality: Right;  . Cystoscopy w/ ureteral stent placement Right 02/01/2015    Procedure: CYSTOSCOPY WITH RETROGRADE PYELOGRAM/URETERAL STENT PLACEMENT;  Surgeon: Festus Aloe, MD;  Location: [facility];  Service: Urology;  Laterality: Right;   Family History  Problem Relation Age of Onset  . Hypertension Mother   . Diabetes Mother   . Diabetes Maternal Grandmother   . Diabetes Maternal Grandfather   . HIV/AIDS Maternal Uncle   . Stomach cancer Other     MGMs sister   Social History  Substance Use Topics  . Smoking status: Never Smoker   . Smokeless tobacco: Never Used  . Alcohol Use: No     Comment: very occasional- none since feb 2015 when pt got sick   OB History    Gravida Para Term Preterm AB TAB SAB Ectopic Multiple Living   1 1  1             Obstetric Comments   27 weeks, NICU stay. C-section secondary to Pre-E     Review of Systems  Constitutional: Positive for appetite change, fatigue and unexpected weight change. Negative for fever.  HENT: Negative for sore throat.   Eyes: Negative for visual disturbance.  Respiratory: Negative for cough and shortness of breath.   Cardiovascular: Negative for chest pain.  Gastrointestinal: Positive for  nausea, vomiting, abdominal pain (chronic) and diarrhea. Negative for constipation, blood in stool and anal bleeding.  Genitourinary: Negative for dysuria and difficulty urinating.  Musculoskeletal: Negative for back pain and neck pain.  Skin: Negative for rash.  Neurological: Negative for syncope and headaches.      Allergies  Compazine; Azithromycin; Sulfa antibiotics; Hydrocodone-acetaminophen; and Other  Home Medications   Prior to Admission medications   Medication Sig Start Date End Date Taking? Authorizing Provider  Artificial Tear Ointment (DRY EYES OP) Apply 2 drops to eye daily as needed (dry eyes).   Yes Historical Provider, MD   bumetanide (BUMEX) 1 MG tablet Take 1 mg by mouth daily as needed. Fluid, swelling 06/10/15  Yes Historical Provider, MD  Cholecalciferol (VITAMIN D-3 PO) Take 5 mLs by mouth daily.   Yes Historical Provider, MD  diphenoxylate-atropine (LOMOTIL) 2.5-0.025 MG tablet Take 1 tablet by mouth every 4 (four) hours as needed. diarrhea 08/13/15  Yes Historical Provider, MD  dronabinol (MARINOL) 5 MG capsule Take 5 mg by mouth every 12 (twelve) hours as needed. n/v 06/29/15  Yes Historical Provider, MD  DULoxetine (CYMBALTA) 30 MG capsule Take 30 mg by mouth daily. 07/08/15  Yes Historical Provider, MD  ferrous sulfate 325 (65 FE) MG tablet Take 325 mg by mouth 3 (three) times a week.    Yes Historical Provider, MD  hydroxychloroquine (PLAQUENIL) 200 MG tablet Take 1 tablet (200 mg total) by mouth 2 (two) times daily. 02/12/15  Yes Ladell Pier, MD  morphine (MS CONTIN) 30 MG 12 hr tablet Take 1 tablet (30 mg total) by mouth every 12 (twelve) hours. Patient taking differently: Take 30 mg by mouth every 12 (twelve) hours as needed for pain.  05/19/15  Yes Owens Shark, NP  morphine (ROXANOL) 20 MG/ML concentrated solution Take 1/2 ml to 55ml every 4 hours as need for pain 04/21/15  Yes Owens Shark, NP  ondansetron (ZOFRAN) 4 MG tablet Take 1 tablet (4 mg total) by mouth every 8 (eight) hours as needed for nausea or vomiting. 12/02/14  Yes Owens Shark, NP  OVER THE COUNTER MEDICATION Take 15 mLs by mouth daily. [place]- Carn[place]ine- energy pill   Yes Historical Provider, MD  potassium chloride SA (K-DUR,KLOR-CON) 20 MEQ tablet Take 1 tablet (20 mEq total) by mouth daily. 03/07/15  Yes Orpah Greek, MD  amoxicillin-clavulanate (AUGMENTIN) 875-125 MG tablet Take 1 tablet by mouth 2 (two) times daily. For 30days Patient not taking: Reported on 08/16/2015 03/04/15   Truman Hayward, MD  dexamethasone (DECADRON) 2 MG tablet Take 5 tablets (10 mg) PO at 10PM the night before chemo and 5 tablets at 6AM the morning  of first chemo. Patient not taking: Reported on 08/16/2015 05/19/15   Ladell Pier, MD   BP 100/63 mmHg  Pulse 120  Temp(Src) 98.5 F (36.9 C) (Oral)  Resp 19  Ht 5\' 5"  (1.651 m)  Wt 116 lb 13.5 oz (53 kg)  BMI 19.44 kg/m2  SpO2 99% Physical Exam  Constitutional: She is oriented to person, place, and time. She appears cachectic. She has a sickly appearance. No distress.  HENT:  Head: Normocephalic and atraumatic.  Eyes: Conjunctivae and EOM are normal.  Neck: Normal range of motion.  Cardiovascular: Normal rate, regular rhythm, normal heart sounds and intact distal pulses.  Exam reveals no gallop and no friction rub.   No murmur heard. Pulmonary/Chest: Effort normal and breath sounds normal. No respiratory distress. She has no wheezes.  She has no rales.  Abdominal: Soft. She exhibits no distension. There is tenderness (diffuse, reports baseline). There is no guarding.  Genitourinary:  External hemorrhoid, no surrounding erythema, no fluctuance, digital exam deferred due to neutropenia.  Musculoskeletal: She exhibits no edema or tenderness.  Neurological: She is alert and oriented to person, place, and time.  Skin: Skin is warm and dry. No rash noted. She is not diaphoretic. No erythema.  Nursing note and vitals reviewed.   ED Course  Procedures (including critical care time) Labs Review Labs Reviewed  COMPREHENSIVE METABOLIC PANEL - Abnormal; Notable for the following:    Sodium 134 (*)    Potassium 3.3 (*)    Glucose, Bld 111 (*)    Calcium 7.3 (*)    Total Protein 5.6 (*)    Albumin 1.8 (*)    ALT 9 (*)    All other components within normal limits  CBC WITH DIFFERENTIAL/PLATELET - Abnormal; Notable for the following:    WBC 0.5 (*)    RBC 2.29 (*)    Hemoglobin 6.4 (*)    HCT 19.4 (*)    RDW 21.6 (*)    Platelets 70 (*)    Neutro Abs 0.0 (*)    Lymphs Abs 0.3 (*)    All other components within normal limits  PHOSPHORUS - Abnormal; Notable for the following:     Phosphorus 2.3 (*)    All other components within normal limits  LACTIC ACID, PLASMA - Abnormal; Notable for the following:    Lactic Acid, Venous 2.4 (*)    All other components within normal limits  PROTIME-INR - Abnormal; Notable for the following:    Prothrombin Time 15.9 (*)    All other components within normal limits  APTT - Abnormal; Notable for the following:    aPTT 23 (*)    All other components within normal limits  PREALBUMIN - Abnormal; Notable for the following:    Prealbumin 2.2 (*)    All other components within normal limits  BASIC METABOLIC PANEL - Abnormal; Notable for the following:    Potassium 3.2 (*)    Glucose, Bld 106 (*)    Calcium 7.1 (*)    All other components within normal limits  CBC - Abnormal; Notable for the following:    WBC 0.4 (*)    RBC 2.52 (*)    Hemoglobin 7.0 (*)    HCT 20.9 (*)    RDW 22.2 (*)    Platelets 73 (*)    All other components within normal limits  POC OCCULT BLOOD, ED - Abnormal; Notable for the following:    Fecal Occult Bld POSITIVE (*)    All other components within normal limits  CULTURE, BLOOD (ROUTINE X 2)  CULTURE, BLOOD (ROUTINE X 2)  C DIFFICILE QUICK SCREEN W PCR REFLEX  MRSA PCR SCREENING  URINE CULTURE  MAGNESIUM  LACTIC ACID, PLASMA  PROCALCITONIN  URINALYSIS, ROUTINE W REFLEX MICROSCOPIC (NOT AT [facility])  I-STAT CG4 LACTIC ACID, ED    Imaging Review Ct Abdomen Pelvis W Contrast  08/17/2015  CLINICAL DATA:  Acute onset of generalized abdominal distention and diarrhea. Nausea, vomiting and fever. Neutropenia. Initial encounter. EXAM: CT ABDOMEN AND PELVIS WITH CONTRAST TECHNIQUE: Multidetector CT imaging of the abdomen and pelvis was performed using the standard protocol following bolus administration of intravenous contrast. CONTRAST:  62mL ISOVUE-300 IOPAMIDOL (ISOVUE-300) INJECTION 61% COMPARISON:  CT of the chest, abdomen and pelvis performed 05/14/2015 FINDINGS: Bibasilar airspace opacities may reflect  atelectasis or pneumonia. Large volume ascites is noted filling the abdomen and pelvis. Areas of vaguely decreased attenuation are noted within the liver, of uncertain significance. The spleen is borderline normal in size. Diffuse pneumobilia is noted, with a common bile duct stent noted in expected position. The gallbladder is relatively decompressed and not well assessed due to surrounding ascites. Vague masslike density is noted about the pancreatic head, with surrounding postoperative change. The adrenal glands are grossly unremarkable in appearance Underlying splenic and gastric varices are noted. Postoperative change is noted at the stomach. Known metastatic disease along the omentum is difficult to fully assess due to compression by ascites. A right ureteral stent is noted. There is mild right renal atrophy. The kidneys are otherwise unremarkable. No significant hydronephrosis is seen. No renal or ureteral stones are identified. Evaluation for perinephric stranding is limited due to surrounding ascites. There is diffuse wall thickening involving much of the distal and terminal ileum, concerning for acute infectious or inflammatory ileitis. The remainder of the small bowel is grossly unremarkable. The stomach is within normal limits. No acute vascular abnormalities are seen. The appendix is not definitely seen; there is no evidence of appendicitis. The colon is grossly unremarkable in appearance. Vague soft tissue inflammation is noted about the rectum, of uncertain significance. The bladder is moderately distended and grossly unremarkable. The uterus is grossly unremarkable in appearance. Bilateral cystic lesions are noted at the ovaries, measuring 7.6 cm on the right and 4.2 cm on the left, of uncertain significance. No inguinal lymphadenopathy is seen. No acute osseous abnormalities are identified. IMPRESSION: 1. Large volume ascites noted filling the abdomen and pelvis, markedly increased from the prior  study. 2. Diffuse wall thickening noted along the distal and terminal ileum, concerning for acute infectious or inflammatory ileitis. 3. Known metastatic disease is difficult to fully assess given the extent of underlying ascites. 4. Vague masslike density about the pancreatic head, with surrounding postoperative change. Common bile duct stent noted in unchanged position, with underlying pneumobilia. 5. Bibasilar airspace opacities may reflect atelectasis or pneumonia. 6. Areas of vaguely decreased attenuation within the liver, of uncertain significance. Underlying metastatic disease cannot be excluded. 7. Mild right renal atrophy noted. 8. Vague nonspecific soft tissue inflammation about the rectum. 9. Bilateral cystic foci again noted at the ovaries, which could reflect metastatic disease to the ovaries from the patient's gastric primary, though the appearance is nonspecific. This is somewhat larger on the right and smaller on the left. Electronically Signed   By: Garald Balding M.D.   On: 08/17/2015 03:51   Dg Chest Port 1 View  08/16/2015  CLINICAL DATA:  Stomach cancer, diarrhea for 2 weeks, nausea, vomiting EXAM: PORTABLE CHEST 1 VIEW COMPARISON:  05/14/2015 FINDINGS: There are bilateral low lung volumes. There is bibasilar airspace disease which may reflect atelectasis versus pneumonia. There is no pleural effusion or pneumothorax. The heart and mediastinal contours are unremarkable. There is a right-sided Port-A-Cath in satisfactory position. The osseous structures are unremarkable. IMPRESSION: Low lung volumes. Bibasilar airspace disease which may reflect atelectasis versus pneumonia. Electronically Signed   By: Kathreen Devoid   On: 08/16/2015 16:59   I have personally reviewed and evaluated these images and lab results as part of my medical decision-making.   EKG Interpretation   Date/Time:  Monday Aug 16 2015 17:45:25 EDT Ventricular Rate:  190 PR Interval:  96 QRS Duration: 128 QT Interval:     QTC Calculation:   R Axis:   130 Text  Interpretation:  Sinus tachycardia Borderline prolonged QT interval  No significant change since last tracing Confirmed by [facility] MD, Curtistine Pettitt  ([id number]) on 08/16/2015 6:08:40 PM      MDM   Final diagnoses:  Abdominal distention  Sepsis ([place])   45 year old female with a history of gastric cancer with last chemotherapy 2 weeks ago presents with concern of nausea, vomiting, diarrhea and fever. Patient febrile to 101.3 on arrival to the emergency department, with sinus tachycardia in the 110s, and blood pressure initially in the 0000000 systolic. Code sepsis was initiated with 30/kg normal saline given and vancomycin and Indocin given for sepsis of unclear source. In addition, given patient's diarrhea, sent stool sample which was negative for c. Diff.  Patient's lab work returned showing neutropenia with absolute neutrophil count of 0, anemia with a hemoglobin of 6.4, and thrombocytopenia. Patient is a [name], and declines blood transfusion.  Given patient's neutropenia, we'll change antibiotics to cefepime, however given she is currently getting Zosyn will continue with this.    Unclear source of fever at this time. Patient reports some mucus production, however denies significant cough, and x-ray shows atelectasis versus pneumonia. Urinalysis is pending. Stool studies are pending. No sign of rash.   Will admit for continued care of fever and neutropenia.  VS may indicate sepsis versus dehydration.    Gareth Morgan, MD 08/17/15 1234

## 2015-08-16 NOTE — Progress Notes (Signed)
Pharmacy Antibiotic Note  Andrea Best is a 45 y.o. female with PMHx gastric cancer currently being treated at the [facility] in [place], [place] admitted on 08/16/2015 with diarrhea, emesis, hypotension, and fever.  ANC noted to be 0.0.  Pharmacy has been consulted for Vancomycin and Cefepime for sepsis and febrile neutropenia.  Zosyn and Vanc x 1 ordered in ED.   Plan: Vanc 1g IV x1, then 750mg  IV q12h Cefepime 2g IV q8h F/u renal function, cultures, VT at Css, clinical course  Height: 5\' 6"  (167.6 cm) Weight: 114 lb (51.71 kg) IBW/kg (Calculated) : 59.3  Temp (24hrs), Avg:101.3 F (38.5 C), Min:101.3 F (38.5 C), Max:101.3 F (38.5 C)  No results for input(s): WBC, CREATININE, LATICACIDVEN, VANCOTROUGH, VANCOPEAK, VANCORANDOM, GENTTROUGH, GENTPEAK, GENTRANDOM, TOBRATROUGH, TOBRAPEAK, TOBRARND, AMIKACINPEAK, AMIKACINTROU, AMIKACIN in the last 168 hours.  CrCl cannot be calculated (Patient has no serum creatinine result on file.).    Allergies  Allergen Reactions  . Compazine [Prochlorperazine Maleate] Other (See Comments)    Dystonia  . Azithromycin Rash  . Sulfa Antibiotics Rash    hallucinations  . Hydrocodone-Acetaminophen Nausea And Vomiting    Hallucinating   . 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 SHE DOES NOT WANT ANY BLOOD transfusions but will accept Albumin,     Antimicrobials this admission: 5/22 Vancomcyin >>  5/22 Zosyn x1 5/22 Flaygl x1 5/22 Cefepime >>  Dose adjustments this admission: Previous VT =17.7 on 10/22/13 on Vanc 1g q8h with SCr 0.4-0.5  Microbiology results: 5/22 BCx: collected 5/22 UCx: ordered  5/22 CDiff: ordered  Thank you for allowing pharmacy to be a part of this patient's care.  Ralene Bathe, PharmD, BCPS 08/16/2015, 4:46 PM  Pager: [phone]

## 2015-08-17 ENCOUNTER — Encounter (HOSPITAL_COMMUNITY): Payer: Self-pay

## 2015-08-17 ENCOUNTER — Encounter: Payer: Self-pay | Admitting: *Deleted

## 2015-08-17 ENCOUNTER — Inpatient Hospital Stay (HOSPITAL_COMMUNITY): Payer: BLUE CROSS/BLUE SHIELD

## 2015-08-17 DIAGNOSIS — R5081 Fever presenting with conditions classified elsewhere: Secondary | ICD-10-CM

## 2015-08-17 DIAGNOSIS — R180 Malignant ascites: Secondary | ICD-10-CM

## 2015-08-17 DIAGNOSIS — R634 Abnormal weight loss: Secondary | ICD-10-CM

## 2015-08-17 DIAGNOSIS — R630 Anorexia: Secondary | ICD-10-CM

## 2015-08-17 DIAGNOSIS — R140 Abdominal distension (gaseous): Secondary | ICD-10-CM

## 2015-08-17 DIAGNOSIS — C162 Malignant neoplasm of body of stomach: Secondary | ICD-10-CM

## 2015-08-17 DIAGNOSIS — G893 Neoplasm related pain (acute) (chronic): Secondary | ICD-10-CM

## 2015-08-17 DIAGNOSIS — C800 Disseminated malignant neoplasm, unspecified: Secondary | ICD-10-CM

## 2015-08-17 DIAGNOSIS — R197 Diarrhea, unspecified: Secondary | ICD-10-CM

## 2015-08-17 DIAGNOSIS — D61818 Other pancytopenia: Secondary | ICD-10-CM

## 2015-08-17 LAB — BASIC METABOLIC PANEL
Anion gap: 6 (ref 5–15)
BUN: 7 mg/dL (ref 6–20)
CO2: 22 mmol/L (ref 22–32)
Calcium: 7.1 mg/dL — ABNORMAL LOW (ref 8.9–10.3)
Chloride: 107 mmol/L (ref 101–111)
Creatinine, Ser: 0.67 mg/dL (ref 0.44–1.00)
GFR calc Af Amer: >60 mL/min (ref >60)
GFR calc non Af Amer: >60 mL/min (ref >60)
Glucose, Bld: 106 mg/dL — ABNORMAL HIGH (ref 65–99)
Potassium: 3.2 mmol/L — ABNORMAL LOW (ref 3.5–5.1)
Sodium: 135 mmol/L (ref 135–145)

## 2015-08-17 LAB — CBC
HCT: 20.9 % — ABNORMAL LOW (ref 36.0–46.0)
Hemoglobin: 7 g/dL — ABNORMAL LOW (ref 12.0–15.0)
MCH: 27.8 pg (ref 26.0–34.0)
MCHC: 33.5 g/dL (ref 30.0–36.0)
MCV: 82.9 fL (ref 78.0–100.0)
Platelets: 73 10*3/uL — ABNORMAL LOW (ref 150–400)
RBC: 2.52 MIL/uL — ABNORMAL LOW (ref 3.87–5.11)
RDW: 22.2 % — ABNORMAL HIGH (ref 11.5–15.5)
WBC: 0.4 10*3/uL — CL (ref 4.0–10.5)

## 2015-08-17 LAB — BODY FLUID CELL COUNT WITH DIFFERENTIAL
EOS FL: 0 %
LYMPHS FL: 2 %
MONOCYTE-MACROPHAGE-SEROUS FLUID: 98 % — AB (ref 50–90)
NEUTROPHIL FLUID: 0 % (ref 0–25)
WBC FLUID: 51 uL (ref 0–1000)

## 2015-08-17 LAB — MRSA PCR SCREENING: MRSA by PCR: NEGATIVE

## 2015-08-17 LAB — PREALBUMIN: Prealbumin: 2.2 mg/dL — ABNORMAL LOW (ref 18–38)

## 2015-08-17 LAB — GRAM STAIN

## 2015-08-17 LAB — LACTIC ACID, PLASMA: Lactic Acid, Venous: 1.6 mmol/L (ref 0.5–2.0)

## 2015-08-17 MED ORDER — DIPHENHYDRAMINE HCL 25 MG PO CAPS
25.0000 mg | ORAL_CAPSULE | Freq: Every evening | ORAL | Status: DC | PRN
  Administered 2015-08-17: 25 mg via ORAL
  Filled 2015-08-17: qty 1

## 2015-08-17 MED ORDER — BOOST / RESOURCE BREEZE PO LIQD
1.0000 | Freq: Two times a day (BID) | ORAL | Status: DC
  Administered 2015-08-17 – 2015-08-22 (×8): 1 via ORAL

## 2015-08-17 MED ORDER — SODIUM CHLORIDE 0.9 % IV SOLN
INTRAVENOUS | Status: DC
  Administered 2015-08-18 – 2015-08-19 (×3): via INTRAVENOUS
  Administered 2015-08-20: 1000 mL via INTRAVENOUS

## 2015-08-17 MED ORDER — ENSURE ENLIVE PO LIQD
237.0000 mL | Freq: Two times a day (BID) | ORAL | Status: DC
  Administered 2015-08-17: 237 mL via ORAL

## 2015-08-17 MED ORDER — IOPAMIDOL (ISOVUE-300) INJECTION 61%
100.0000 mL | Freq: Once | INTRAVENOUS | Status: AC | PRN
  Administered 2015-08-17: 80 mL via INTRAVENOUS

## 2015-08-17 MED ORDER — SODIUM CHLORIDE 0.9% FLUSH
10.0000 mL | Freq: Two times a day (BID) | INTRAVENOUS | Status: DC
  Administered 2015-08-17 – 2015-08-18 (×2): 10 mL
  Administered 2015-08-21: 20 mL

## 2015-08-17 MED ORDER — POTASSIUM CHLORIDE CRYS ER 20 MEQ PO TBCR
40.0000 meq | EXTENDED_RELEASE_TABLET | Freq: Once | ORAL | Status: AC
  Administered 2015-08-17: 40 meq via ORAL
  Filled 2015-08-17: qty 2

## 2015-08-17 MED ORDER — LOPERAMIDE HCL 2 MG PO CAPS
2.0000 mg | ORAL_CAPSULE | ORAL | Status: DC | PRN
  Administered 2015-08-17: 2 mg via ORAL
  Filled 2015-08-17: qty 1

## 2015-08-17 MED ORDER — MORPHINE SULFATE ER 30 MG PO TBCR
30.0000 mg | EXTENDED_RELEASE_TABLET | Freq: Two times a day (BID) | ORAL | Status: DC
  Administered 2015-08-17 – 2015-08-20 (×6): 30 mg via ORAL
  Filled 2015-08-17 (×6): qty 1

## 2015-08-17 MED ORDER — ENSURE ENLIVE PO LIQD
237.0000 mL | ORAL | Status: DC
  Administered 2015-08-19 – 2015-08-22 (×2): 237 mL via ORAL

## 2015-08-17 MED ORDER — SODIUM CHLORIDE 0.9% FLUSH: 10.0000 mL | INTRAVENOUS | Status: DC | PRN

## 2015-08-17 NOTE — Progress Notes (Signed)
IP PROGRESS NOTE  Subjective:   Andrea Best is well-known to me with a history of metastatic gastric cancer. She was last treated here 05/26/2015 with Taxol/ ramuciurmab. We last saw her on 06/03/2015. Her performance status had declined. We discussed hospice care, but she wanted to continue chemotherapy. She was scheduled for follow-up, but transferred her care to the [facility] [place] in [place].  She was last treated with chemotherapy on 08/05/2015. The patient and her mother report she is being treated with 5 fluorouracil, irinotecan, leucovorin, and ramucirumab.  She presented yesterday with failure to thrive, fever, and diarrhea. She was found to have severe pancytopenia.  She reports stable abdominal pain. Limited oral intake. She is using a protein supplement.  She has been flying to [facility] every 2 weeks for treatment.  Objective: Vital signs in last 24 hours: Blood pressure 97/57, pulse 118, temperature 98.7 F (37.1 C), temperature source Oral, resp. rate 23, height 5\' 5"  (1.651 m), weight 116 lb 13.5 oz (53 kg), SpO2 99 %.  Intake/Output from previous day: 05/22 0701 - 05/23 0700 In: 3858.8 [P.O.:180; I.V.:528.8; IV Piggyback:3150] Out: 900 [Urine:900]  Physical Exam:  HEENT: No thrush Lungs: Clear bilaterally Cardiac: Regular rate and rhythm Abdomen: Distended with ascites, nontender, no mass, no hepatomegaly Extremities: No leg edema  Portacath/PICC-without erythema  Lab Results:  Recent Labs  08/16/15 1615 08/17/15 0308  WBC 0.5* 0.4*  HGB 6.4* 7.0*  HCT 19.4* 20.9*  PLT 70* 73*    BMET  Recent Labs  08/16/15 1615 08/17/15 0308  NA 134* 135  K 3.3* 3.2*  CL 104 107  CO2 24 22  GLUCOSE 111* 106*  BUN 9 7  CREATININE 0.80 0.67  CALCIUM 7.3* 7.1*    Studies/Results: Ct Abdomen Pelvis W Contrast  08/17/2015  CLINICAL DATA:  Acute onset of generalized abdominal distention and diarrhea. Nausea, vomiting and fever.  Neutropenia. Initial encounter. EXAM: CT ABDOMEN AND PELVIS WITH CONTRAST TECHNIQUE: Multidetector CT imaging of the abdomen and pelvis was performed using the standard protocol following bolus administration of intravenous contrast. CONTRAST:  98mL ISOVUE-300 IOPAMIDOL (ISOVUE-300) INJECTION 61% COMPARISON:  CT of the chest, abdomen and pelvis performed 05/14/2015 FINDINGS: Bibasilar airspace opacities may reflect atelectasis or pneumonia. Large volume ascites is noted filling the abdomen and pelvis. Areas of vaguely decreased attenuation are noted within the liver, of uncertain significance. The spleen is borderline normal in size. Diffuse pneumobilia is noted, with a common bile duct stent noted in expected position. The gallbladder is relatively decompressed and not well assessed due to surrounding ascites. Vague masslike density is noted about the pancreatic head, with surrounding postoperative change. The adrenal glands are grossly unremarkable in appearance Underlying splenic and gastric varices are noted. Postoperative change is noted at the stomach. Known metastatic disease along the omentum is difficult to fully assess due to compression by ascites. A right ureteral stent is noted. There is mild right renal atrophy. The kidneys are otherwise unremarkable. No significant hydronephrosis is seen. No renal or ureteral stones are identified. Evaluation for perinephric stranding is limited due to surrounding ascites. There is diffuse wall thickening involving much of the distal and terminal ileum, concerning for acute infectious or inflammatory ileitis. The remainder of the small bowel is grossly unremarkable. The stomach is within normal limits. No acute vascular abnormalities are seen. The appendix is not definitely seen; there is no evidence of appendicitis. The colon is grossly unremarkable in appearance. Vague soft tissue inflammation is noted about  the rectum, of uncertain significance. The bladder is  moderately distended and grossly unremarkable. The uterus is grossly unremarkable in appearance. Bilateral cystic lesions are noted at the ovaries, measuring 7.6 cm on the right and 4.2 cm on the left, of uncertain significance. No inguinal lymphadenopathy is seen. No acute osseous abnormalities are identified. IMPRESSION: 1. Large volume ascites noted filling the abdomen and pelvis, markedly increased from the prior study. 2. Diffuse wall thickening noted along the distal and terminal ileum, concerning for acute infectious or inflammatory ileitis. 3. Known metastatic disease is difficult to fully assess given the extent of underlying ascites. 4. Vague masslike density about the pancreatic head, with surrounding postoperative change. Common bile duct stent noted in unchanged position, with underlying pneumobilia. 5. Bibasilar airspace opacities may reflect atelectasis or pneumonia. 6. Areas of vaguely decreased attenuation within the liver, of uncertain significance. Underlying metastatic disease cannot be excluded. 7. Mild right renal atrophy noted. 8. Vague nonspecific soft tissue inflammation about the rectum. 9. Bilateral cystic foci again noted at the ovaries, which could reflect metastatic disease to the ovaries from the patient's gastric primary, though the appearance is nonspecific. This is somewhat larger on the right and smaller on the left. Electronically Signed   By: Garald Balding M.D.   On: 08/17/2015 03:51   Dg Chest Port 1 View  08/16/2015  CLINICAL DATA:  Stomach cancer, diarrhea for 2 weeks, nausea, vomiting EXAM: PORTABLE CHEST 1 VIEW COMPARISON:  05/14/2015 FINDINGS: There are bilateral low lung volumes. There is bibasilar airspace disease which may reflect atelectasis versus pneumonia. There is no pleural effusion or pneumothorax. The heart and mediastinal contours are unremarkable. There is a right-sided Port-A-Cath in satisfactory position. The osseous structures are unremarkable.  IMPRESSION: Low lung volumes. Bibasilar airspace disease which may reflect atelectasis versus pneumonia. Electronically Signed   By: Kathreen Devoid   On: 08/16/2015 16:59    Medications: I have reviewed the patient's current medications.  Assessment/Plan:  1. 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 Recurrent admissions for gram-negative sepsis  4. Pancytopenia secondary to chemotherapy  5. Diarrhea-likely security chemotherapy  6. Fever in the setting of severe neutropenia  7. Anorexia/weight loss  8. Pain secondary to carcinomatosis   Andrea Best has advanced metastatic gastric cancer. She is being treated with systemic therapy in [place]. She is now at day 13 following the most recent cycle of chemotherapy. She is admitted with severe pancytopenia and fever. Potential sources for infection include the biliary tract and urinary tract.  She appears wasted. I suspect she has malignant ascites.  I discussed the situation with Andrea Best and her mother. I explained the current chemotherapy could be given in [place]. They plan to return to [facility] to continue treatment.  Recommendations: 1. Broad-spectrum intravenous antibodies, follow-up cultures 2. G-CSF 3. Continue MS Contin and Roxanol for pain 4. Daily CBC 5. Diagnostic/therapeutic paracentesis 6. Add antidiarrhea medication 7. I will continue following her in the hospital and then as an outpatient as needed   LOS: 1 day   Betsy Coder, MD   08/17/2015, 2:08 PM

## 2015-08-17 NOTE — Care Management Note (Signed)
Case Management Note  Patient Details  Name: Andrea Best MRN: [id number] Date of Birth: December 26, 1970  Subjective/Objective:     sepsis               Action/Plan:Date:  Aug 17, 2015 Chart reviewed for concurrent status and case management needs. Will continue to follow patient for changes and needs: Expected discharge date: [id number] Velva Harman, BSN, [place], [place]   Expected Discharge Date:                  Expected Discharge Plan:  Home/Self Care  In-House Referral:  NA  Discharge planning Services  CM Consult  Post Acute Care Choice:  NA Choice offered to:  NA  DME Arranged:    DME Agency:     HH Arranged:    [place] Agency:     Status of Service:  In process, will continue to follow  Medicare Important Message Given:    Date Medicare IM Given:    Medicare IM give by:    Date Additional Medicare IM Given:    Additional Medicare Important Message give by:     If discussed at [place] of Stay Meetings, dates discussed:    Additional Comments:  Leeroy Cha, RN 08/17/2015, 11:13 AM

## 2015-08-17 NOTE — Progress Notes (Signed)
Oncology Nurse Navigator Documentation  Oncology Nurse Navigator Flowsheets 08/17/2015  Navigator Location [facility]  Navigator Encounter Type Other--Hospital visit  Patient Visit Type Inpatient  Treatment Phase Active Tx at [facility] in [facility] Needs Coordination of Care  Education -  Interventions Coordination of Care--sent ROI to [facility] for records at Dr. Gearldine Shown request. Records received and forwarded to MD  Coordination of Care Other--  Acuity Level 1  Time Spent with Patient [place] that she is very pleased with her care in [place]-center provides her air fair, hotel and she has physical therapy and sees a IT sales professional" with every visit in addition to her chemo treatment. She agreed to sign release for Dr. Benay Spice to obtain recent records from facility to help coordinate her hospital care. Is feeling stronger today and hopes to go home soon.

## 2015-08-17 NOTE — Procedures (Signed)
Ultrasound-guided diagnostic and therapeutic paracentesis performed yielding 3.3 liters of clear yellow colored fluid. No immediate complications.  Andrea Best E 4:19 PM 08/17/2015

## 2015-08-17 NOTE — Progress Notes (Signed)
Initial Nutrition Assessment  DOCUMENTATION CODES:   Severe malnutrition in context of chronic illness  INTERVENTION:  - Will decrease Ensure Enlive to once/day, this supplement provides 350 kcal and 20 grams of protein. - Will order Boost Breeze BID, each supplement provides 250 kcal and 9 grams of protein - Encourage PO intakes of meals and supplements - RD will continue to monitor for additional needs, education needs.  NUTRITION DIAGNOSIS:   Increased nutrient needs related to catabolic illness, cancer and cancer related treatments as evidenced by estimated needs.  GOAL:   Patient will meet greater than or equal to 90% of their needs  MONITOR:   PO intake, Supplement acceptance, Weight trends, Labs, I & O's  REASON FOR ASSESSMENT:   Malnutrition Screening Tool, Consult Assessment of nutrition requirement/status  ASSESSMENT:   46 y.o. female with medical history significant of gastric cancer with gastric outlet obstruction, malnutrition, lupus, Sjogren's syndrome, chronic anemia secondary to iron deficiency as well as antineoplastic chemotherapy-induced anemia  Pt seen for MST and consult. BMI indicates normal weight. No intakes documented but visualized breakfast tray with 25-50% completion. Pt with hx of gastric cancer and gastric outlet obstruction; she is receiving chemo treatment from the [facility] and last chemo was 08/05/15. Pt states that over the past few weeks she has been becoming full quicker stating that she is eating half the amount of any one item that she previously was able to consume. Pt denies abdominal pain associated with eating/drinking or feelings of fullness. She denies nausea/vomiting until 2 days ago. Pt also denies any issues with bowel habits (including diarrhea) while on chemo until she began having diarrhea 1 week PTA. Pt states that acidic items irritate her throat. She indicates that she does better with liquids than solid  foods as it relates to feelings of fullness.   Pt is currently ordered Ensure Enlive BID. She does not like this supplement but understands the importance of it and family, who is at bedside, is very encouraging with intakes. Talked with pt about Boost Breeze and she is very interested in trying this supplements; discussed mixing it with Sprite, ginger ale, or water if sweetness bothers her.   Physical assessment done to upper body only with respect to pt comfort and indicates severe muscle and fat wasting. Abdominal distention present. Family reports pt previously had drainage to this site in [place] and that swelling has again begun to increase; pt and family feel this is contributing to early satiety. Per chart review, pt has lost 23 lbs (16.5% body weight) in the past 2.5 months which is significant for time frame.   Not able to meet needs with decreased gastric space; will monitor for need for education concerning diet. Will also monitor POC/GOC. Medications reviewed; 20 mEq KCl/day. IVF: NS @ 75 mL/hr. Labs reviewed; K: 3.2 mmol/L, Ca: 7.1 mg/dL.   Diet Order:  Diet Heart Room service appropriate?: Yes; Fluid consistency:: Thin  Skin:  Reviewed, no issues  Last BM:  5/23  Height:   Ht Readings from Last 1 Encounters:  08/16/15 5\' 5"  (1.651 m)    Weight:   Wt Readings from Last 1 Encounters:  08/16/15 116 lb 13.5 oz (53 kg)    Ideal Body Weight:  56.82 kg (kg)  BMI:  Body mass index is 19.44 kg/(m^2).  Estimated Nutritional Needs:   Kcal:  1600-1850 (~30-35 kcal/kg)  Protein:  74-85 grams (1.4-1.6 grams/kg)  Fluid:  1.7-1.9 L/day  EDUCATION NEEDS:  No education needs identified at this time     Andrea Best, [place], [facility] Inpatient Clinical Dietitian Pager # [phone] After hours/weekend pager # [phone]

## 2015-08-17 NOTE — Progress Notes (Signed)
Stable post Paracentesis. Bandaid D&I. Belly soft. Stable HR and BP.

## 2015-08-17 NOTE — Progress Notes (Addendum)
PROGRESS NOTE    Andrea Best  [id number] DOB: Jul 03, 1970 DOA: 08/16/2015 PCP: Aretta Nip, MD   Brief Narrative: 44/F Frail female with history of metastatic gastric cancer, currently undergoing Chemo at [facility] in [place]. She is extremely malnourished and has lupus, Sjogren's syndrome, chronic anemia secondary to iron deficiency and chemotherapy-induced anemia, also Jehovah's witness. Presented with 1 week hx of diarrhea up to 2 loose BM a day. For the past 24 hours she developed nausea, vomiting and fever Patient known history of gastric cancer was traveling to [facility] to receive chemotherapy but could not tolerate secondary to severe symptoms. EMS was called to home on their arrival blood pressure down to 80/50 temperature 100.6. Admitted with sepsis, started on Abx and given Neupogen  Assessment & Plan:  . Sepsis/neutropenic fever -Source unclear at this time -She has a biliary stent, but bili within normal limits -Also has ascites hence SBP is a possibility will order ultrasound-guided paracentesis -Continue broad-spectrum antibiotics with vancomycin and cefepime -C. Difficile PCR negative -Follow-up blood cultures -Continue IV fluids  -Blood pressure improving but still soft  -She was given a dose of Neupogen in the emergency room last night  Diarrhea -Suspect chemotherapy induced -C. difficile PCR negative, improving  Metastatic gastric carcinoma -Currently undergoing chemotherapy at [facility] in [place] -Previously followed by Dr. Ammie Dalton who has kindly agreed to see her in the hospital -Overall prognosis very poor, but she wishes to be a full code and continue full aggressive scope of treatment which is currently being provided by Dr.Meiri in [place]. 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 -We'll order a dose of Aranesp today and she may benefit from iron infusion over the next few days  Severe malnutrition -Supplements as tolerated  Chronic back pain -continue MS contin, roxanol  Pancytopenia -Secondary to chemotherapy -Status post Neupogen -Monitor  DVT prophylaxis: SCD   Code Status: FULL CODE as per patient   Family Communication: mother at Bedside Disposition Plan: Keep in SDU  Consultants:   Onc Dr.Sherril   Procedures:   Antimicrobials:Vanc/Cefepime 5/22   Subjective: Still weak, some diarrhea  Objective: Filed Vitals:   08/17/15 0700 08/17/15 0800 08/17/15 1000 08/17/15 1200  BP:  96/60 100/63 97/57  Pulse:  121 120 118  Temp: 98.5 F (36.9 C)   98.7 F (37.1 C)  TempSrc: Oral   Oral  Resp:  15 19 23   Height:      Weight:      SpO2:  99% 99% 99%    Intake/Output Summary (Last 24 hours) at 08/17/15 1442 Last data filed at 08/17/15 1246  Gross per 24 hour  Intake 4171.25 ml  Output    900 ml  Net 3271.25 ml   Filed Weights   08/16/15 1548 08/16/15 1719 08/16/15 2235  Weight: 51.71 kg (114 lb) 51.71 kg (114 lb) 53 kg (116 lb 13.5 oz)    Examination:  General exam: Extremely frail and cachectic Respiratory system: Clear to auscultation. Respiratory effort normal. Cardiovascular system: S1 & S2 heard, RRR. No JVD, murmurs, rubs, gallops or clicks. No pedal edema. Gastrointestinal system: Abdomen is soft, slightly distended, fluid thrill noted, mild diffuse tenderness Central nervous system: Alert and oriented. No focal neurological deficits. Extremities: Extremely cachectic Skin: No rashes, lesions or ulcers Psychiatry: Pleasant and  appropriate    Data Reviewed: I have personally reviewed following labs and imaging studies  CBC:  Recent Labs Lab 08/16/15 1615 08/17/15 0308  WBC 0.5* 0.4*  NEUTROABS 0.0*  --   HGB  6.4* 7.0*  HCT 19.4* 20.9*  MCV 84.7 82.9  PLT 70* 73*   Basic Metabolic Panel:  Recent Labs Lab 08/16/15 1615 08/17/15 0308  NA 134* 135  K 3.3* 3.2*  CL 104 107  CO2 24 22  GLUCOSE 111* 106*  BUN 9 7  CREATININE 0.80 0.67  CALCIUM 7.3* 7.1*  MG 1.7  --   PHOS 2.3*  --    GFR: Estimated Creatinine Clearance: 76.6 mL/min (by C-G formula based on Cr of 0.67). Liver Function Tests:  Recent Labs Lab 08/16/15 1615  AST 16  ALT 9*  ALKPHOS 68  BILITOT 0.7  PROT 5.6*  ALBUMIN 1.8*   No results for input(s): LIPASE, AMYLASE in the last 168 hours. No results for input(s): AMMONIA in the last 168 hours. Coagulation Profile:  Recent Labs Lab 08/16/15 2259  INR 1.31   Cardiac Enzymes: No results for input(s): CKTOTAL, CKMB, CKMBINDEX, TROPONINI in the last 168 hours. BNP (last 3 results) No results for input(s): PROBNP in the last 8760 hours. HbA1C: No results for input(s): HGBA1C in the last 72 hours. CBG: No results for input(s): GLUCAP in the last 168 hours. Lipid Profile: No results for input(s): CHOL, HDL, LDLCALC, TRIG, CHOLHDL, LDLDIRECT in the last 72 hours. Thyroid Function Tests: No results for input(s): TSH, T4TOTAL, FREET4, T3FREE, THYROIDAB in the last 72 hours. Anemia Panel: No results for input(s): VITAMINB12, FOLATE, FERRITIN, TIBC, IRON, RETICCTPCT in the last 72 hours. Urine analysis:    Component Value Date/Time   COLORURINE YELLOW 03/07/2015 0326   APPEARANCEUR CLEAR 03/07/2015 0326   LABSPEC 1.009 03/07/2015 0326   LABSPEC 1.015 01/20/2015 1521   PHURINE 6.5 03/07/2015 0326   PHURINE 6.0 01/20/2015 1521   GLUCOSEU NEGATIVE 03/07/2015 0326   GLUCOSEU Negative 01/20/2015 1521   HGBUR NEGATIVE 03/07/2015 0326   HGBUR Negative 01/20/2015 1521   BILIRUBINUR NEGATIVE 03/07/2015 0326   BILIRUBINUR Negative 01/20/2015 1521   BILIRUBINUR NEG 06/12/2013 0937   KETONESUR NEGATIVE 03/07/2015 0326   KETONESUR Negative 01/20/2015 1521    PROTEINUR 30 05/26/2015 1338   PROTEINUR < 30 01/20/2015 1521   PROTEINUR 30 06/12/2013 0937   UROBILINOGEN 0.2 02/04/2015 1030   UROBILINOGEN 0.2 01/20/2015 1521   UROBILINOGEN 0.2 06/12/2013 0937   NITRITE NEGATIVE 03/07/2015 0326   NITRITE Negative 01/20/2015 1521   NITRITE NEG 06/12/2013 0937   LEUKOCYTESUR TRACE* 03/07/2015 0326   LEUKOCYTESUR Moderate 01/20/2015 1521   Sepsis Labs: @LABRCNTIP (procalcitonin:4,lacticidven:4)  ) Recent Results (from the past 240 hour(s))  Culture, blood (Routine X 2)     Status: None (Preliminary result)   Collection Time: 08/16/15  4:13 PM  Result Value Ref Range Status   Specimen Description BLOOD LEFT FOREARM  Final   Special Requests BOTTLES DRAWN AEROBIC AND ANAEROBIC 5CC  Final   Culture   Final    NO GROWTH < 24 HOURS Performed at [facility]    Report Status PENDING  Incomplete  Culture, blood (Routine X 2)     Status: None (Preliminary result)   Collection Time: 08/16/15  4:14 PM  Result Value Ref Range Status   Specimen Description BLOOD RIGHT ANTECUBITAL  Final   Special Requests BOTTLES DRAWN AEROBIC AND ANAEROBIC 5CC  Final   Culture  Final    NO GROWTH < 24 HOURS Performed at [facility]    Report Status PENDING  Incomplete  C difficile quick scan w PCR reflex     Status: None   Collection Time: 08/16/15  4:53 PM  Result Value Ref Range Status   C Diff antigen NEGATIVE NEGATIVE Final   C Diff toxin NEGATIVE NEGATIVE Final   C Diff interpretation Negative for toxigenic C. difficile  Final  MRSA PCR Screening     Status: None   Collection Time: 08/17/15 12:27 AM  Result Value Ref Range Status   MRSA by PCR NEGATIVE NEGATIVE Final    Comment:        The GeneXpert MRSA Assay (FDA approved for NASAL specimens only), is one component of a comprehensive MRSA colonization surveillance program. It is not intended to diagnose MRSA infection nor to guide or monitor treatment for MRSA infections.           Radiology Studies: Ct Abdomen Pelvis W Contrast  08/17/2015  CLINICAL DATA:  Acute onset of generalized abdominal distention and diarrhea. Nausea, vomiting and fever. Neutropenia. Initial encounter. EXAM: CT ABDOMEN AND PELVIS WITH CONTRAST TECHNIQUE: Multidetector CT imaging of the abdomen and pelvis was performed using the standard protocol following bolus administration of intravenous contrast. CONTRAST:  63mL ISOVUE-300 IOPAMIDOL (ISOVUE-300) INJECTION 61% COMPARISON:  CT of the chest, abdomen and pelvis performed 05/14/2015 FINDINGS: Bibasilar airspace opacities may reflect atelectasis or pneumonia. Large volume ascites is noted filling the abdomen and pelvis. Areas of vaguely decreased attenuation are noted within the liver, of uncertain significance. The spleen is borderline normal in size. Diffuse pneumobilia is noted, with a common bile duct stent noted in expected position. The gallbladder is relatively decompressed and not well assessed due to surrounding ascites. Vague masslike density is noted about the pancreatic head, with surrounding postoperative change. The adrenal glands are grossly unremarkable in appearance Underlying splenic and gastric varices are noted. Postoperative change is noted at the stomach. Known metastatic disease along the omentum is difficult to fully assess due to compression by ascites. A right ureteral stent is noted. There is mild right renal atrophy. The kidneys are otherwise unremarkable. No significant hydronephrosis is seen. No renal or ureteral stones are identified. Evaluation for perinephric stranding is limited due to surrounding ascites. There is diffuse wall thickening involving much of the distal and terminal ileum, concerning for acute infectious or inflammatory ileitis. The remainder of the small bowel is grossly unremarkable. The stomach is within normal limits. No acute vascular abnormalities are seen. The appendix is not definitely seen; there is  no evidence of appendicitis. The colon is grossly unremarkable in appearance. Vague soft tissue inflammation is noted about the rectum, of uncertain significance. The bladder is moderately distended and grossly unremarkable. The uterus is grossly unremarkable in appearance. Bilateral cystic lesions are noted at the ovaries, measuring 7.6 cm on the right and 4.2 cm on the left, of uncertain significance. No inguinal lymphadenopathy is seen. No acute osseous abnormalities are identified. IMPRESSION: 1. Large volume ascites noted filling the abdomen and pelvis, markedly increased from the prior study. 2. Diffuse wall thickening noted along the distal and terminal ileum, concerning for acute infectious or inflammatory ileitis. 3. Known metastatic disease is difficult to fully assess given the extent of underlying ascites. 4. Vague masslike density about the pancreatic head, with surrounding postoperative change. Common bile duct stent noted in unchanged position, with underlying pneumobilia. 5. Bibasilar airspace opacities may reflect atelectasis  or pneumonia. 6. Areas of vaguely decreased attenuation within the liver, of uncertain significance. Underlying metastatic disease cannot be excluded. 7. Mild right renal atrophy noted. 8. Vague nonspecific soft tissue inflammation about the rectum. 9. Bilateral cystic foci again noted at the ovaries, which could reflect metastatic disease to the ovaries from the patient's gastric primary, though the appearance is nonspecific. This is somewhat larger on the right and smaller on the left. Electronically Signed   By: Garald Balding M.D.   On: 08/17/2015 03:51   Dg Chest Port 1 View  08/16/2015  CLINICAL DATA:  Stomach cancer, diarrhea for 2 weeks, nausea, vomiting EXAM: PORTABLE CHEST 1 VIEW COMPARISON:  05/14/2015 FINDINGS: There are bilateral low lung volumes. There is bibasilar airspace disease which may reflect atelectasis versus pneumonia. There is no pleural effusion or  pneumothorax. The heart and mediastinal contours are unremarkable. There is a right-sided Port-A-Cath in satisfactory position. The osseous structures are unremarkable. IMPRESSION: Low lung volumes. Bibasilar airspace disease which may reflect atelectasis versus pneumonia. Electronically Signed   By: Kathreen Devoid   On: 08/16/2015 16:59        Scheduled Meds: . ceFEPime (MAXIPIME) IV  2 g Intravenous Q8H  . DULoxetine  30 mg Oral Daily  . feeding supplement  1 Container Oral BID BM  . [START ON 08/18/2015] feeding supplement (ENSURE ENLIVE)  237 mL Oral Q24H  . morphine  30 mg Oral Q12H  . potassium & sodium phosphates  1 packet Oral Q8H  . potassium chloride SA  20 mEq Oral Daily  . vancomycin  750 mg Intravenous Q12H   Continuous Infusions:    LOS: 1 day    Time spent: 71min    Domenic Polite, MD [facility] Pager [phone]  If 7PM-7AM, please contact night-coverage [email or web address] Password [email or web address] 08/17/2015, 2:42 PM

## 2015-08-17 NOTE — Progress Notes (Signed)
CRITICAL VALUE ALERT  Critical value received:  Lactic acid 2.4  Date of notification:  05/2//02017  Time of notification:  2344  Critical value read back:Yes.    Nurse who received alert:  [id number]  MD notified (1st page):  schorr  Time of first page:  2345  MD notified (2nd page):  Time of second page:  Responding MD:  schorr  Time MD responded:  2350

## 2015-08-18 LAB — DIFFERENTIAL
BASOS PCT: 0 %
Basophils Absolute: 0 10*3/uL (ref 0.0–0.1)
EOS PCT: 0 %
Eosinophils Absolute: 0 10*3/uL (ref 0.0–0.7)
LYMPHS PCT: 42 %
Lymphs Abs: 0.4 10*3/uL — ABNORMAL LOW (ref 0.7–4.0)
Monocytes Absolute: 0.5 10*3/uL (ref 0.1–1.0)
Monocytes Relative: 46 %
Neutro Abs: 0.1 10*3/uL — ABNORMAL LOW (ref 1.7–7.7)
Neutrophils Relative %: 12 %

## 2015-08-18 LAB — COMPREHENSIVE METABOLIC PANEL
ALBUMIN: 1.4 g/dL — AB (ref 3.5–5.0)
ALT: 7 U/L — ABNORMAL LOW (ref 14–54)
ANION GAP: 4 — AB (ref 5–15)
AST: 11 U/L — ABNORMAL LOW (ref 15–41)
Alkaline Phosphatase: 53 U/L (ref 38–126)
BILIRUBIN TOTAL: 0.6 mg/dL (ref 0.3–1.2)
BUN: 6 mg/dL (ref 6–20)
CHLORIDE: 110 mmol/L (ref 101–111)
CO2: 21 mmol/L — ABNORMAL LOW (ref 22–32)
Calcium: 7.4 mg/dL — ABNORMAL LOW (ref 8.9–10.3)
Creatinine, Ser: 0.54 mg/dL (ref 0.44–1.00)
GFR calc Af Amer: >60 mL/min (ref >60)
GLUCOSE: 97 mg/dL (ref 65–99)
POTASSIUM: 3.5 mmol/L (ref 3.5–5.1)
Sodium: 135 mmol/L (ref 135–145)
TOTAL PROTEIN: 4.3 g/dL — AB (ref 6.5–8.1)

## 2015-08-18 LAB — URINALYSIS, ROUTINE W REFLEX MICROSCOPIC
Bilirubin Urine: NEGATIVE
Glucose, UA: NEGATIVE mg/dL
Hgb urine dipstick: NEGATIVE
Ketones, ur: NEGATIVE mg/dL
LEUKOCYTES UA: NEGATIVE
NITRITE: NEGATIVE
PROTEIN: 30 mg/dL — AB
Specific Gravity, Urine: 1.031 — ABNORMAL HIGH (ref 1.005–1.030)
pH: 6 (ref 5.0–8.0)

## 2015-08-18 LAB — URINE MICROSCOPIC-ADD ON

## 2015-08-18 LAB — CBC
HEMATOCRIT: 18.9 % — AB (ref 36.0–46.0)
Hemoglobin: 6.2 g/dL — CL (ref 12.0–15.0)
MCH: 27.3 pg (ref 26.0–34.0)
MCHC: 32.8 g/dL (ref 30.0–36.0)
MCV: 83.3 fL (ref 78.0–100.0)
PLATELETS: 88 10*3/uL — AB (ref 150–400)
RBC: 2.27 MIL/uL — ABNORMAL LOW (ref 3.87–5.11)
RDW: 22.5 % — AB (ref 11.5–15.5)
WBC: 1 10*3/uL — AB (ref 4.0–10.5)

## 2015-08-18 MED ORDER — SODIUM CHLORIDE 0.9 % IV SOLN
500.0000 mg | Freq: Once | INTRAVENOUS | Status: AC
  Administered 2015-08-18: 500 mg via INTRAVENOUS
  Filled 2015-08-18: qty 10

## 2015-08-18 MED ORDER — SACCHAROMYCES BOULARDII 250 MG PO CAPS
250.0000 mg | ORAL_CAPSULE | Freq: Two times a day (BID) | ORAL | Status: DC
  Administered 2015-08-18 – 2015-08-22 (×7): 250 mg via ORAL
  Filled 2015-08-18 (×8): qty 1

## 2015-08-18 MED ORDER — TBO-FILGRASTIM 300 MCG/0.5ML ~~LOC~~ SOSY
300.0000 ug | PREFILLED_SYRINGE | Freq: Once | SUBCUTANEOUS | Status: AC
  Administered 2015-08-18: 300 ug via SUBCUTANEOUS
  Filled 2015-08-18: qty 0.5

## 2015-08-18 MED ORDER — SODIUM CHLORIDE 0.9 % IV SOLN
25.0000 mg | Freq: Once | INTRAVENOUS | Status: AC
  Administered 2015-08-18: 25 mg via INTRAVENOUS
  Filled 2015-08-18: qty 0.5

## 2015-08-18 MED ORDER — SODIUM CHLORIDE 0.9 % IV SOLN: Freq: Once | INTRAVENOUS | Status: DC

## 2015-08-18 NOTE — Progress Notes (Signed)
[facility] PROGRESS NOTE    Progress Note  Andrea Best  [id number] DOB: September 08, 1970 DOA: 08/16/2015 PCP: Aretta Nip, MD     Brief Narrative:   Andrea Best is an 45 y.o. 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 The came into the hospital for 1 week of diarrhea and fevers, came into the hospital via EMS was started on the sepsis protocol with antibiotics and Neupogen.  Assessment/Plan:   Sepsis (HCC)/Neutropenic fever: She was started on admission on empiric antibiotics and Neupogen. As discussed with her blood pressure always runs in the 90s-100s at home. Sores unclear, she has had biliary stents in the past but billi within normal. CT scan of the abdomen and pelvis showed edema along the terminal ileum. Paracentesis done  08/17/2015, which yields with about 3 L, less than 250 PMNs. C. difficile PCR was negative, she was aggressively fluid resuscitated. And blood pressure has improved and continues to be soft. Has been afebrile for more than 24 hours still mildly tachycardic. Leucocytosis started to improved. Can be transferred out of the unit  Chemotherapy-induced diarrhea: C. difficile PCR negative resolving. Start Florastorand Imodium.  Metastatic gastric carcinoma: Currently under chemotherapy followed by Dr. Malachy Mood Overall prognosis is poor. She continues to be a full code and wishes aggressive treatment. We'll have to discuss with her in the future palliative Care.  Iron deficiency anemia/  Antineoplastic chemotherapy induced anemia: Today her hemoglobin is below 7, she was given IV Aranesp yesterday. She'll agree with IV iron.  Severe protein caloric malnutrition: continue supplementation as tolerated.  Chronic back pain. Continue narcotics.  Pancytopenia: Secondary to  chemotherapy, she has received Neupogen and Aranesp. We'll continue to follow.      DVT prophylaxis: SCD's Family Communication:none Disposition Plan/Barrier to D/C: once fever and diarhea improved. Code Status:  Code Status History    Date Active Date Inactive Code Status Order ID Comments User Context   01/20/2015  6:16 PM 02/05/2015  2:55 PM Full Code [id number]  Hosie Poisson, MD Inpatient   01/06/2015  2:43 PM 01/11/2015  7:17 PM Full Code [id number]  Erick Colace, NP ED   06/24/2014  1:29 PM 06/25/2014  3:43 AM Full Code [id number]  Greggory Keen, MD HOV   10/21/2013 11:16 AM 10/27/2013  8:14 PM Full Code [id number]  Marybelle Killings, MD Inpatient   10/08/2013 11:25 PM 10/21/2013 11:16 AM Full Code [id number]  Rise Patience, MD Inpatient    Advance Directive Documentation        Most Recent Value   Type of Advance Directive  Healthcare Power of Attorney   Pre-existing out of facility DNR order (yellow form or pink MOST form)     "MOST" Form in Place?          IV Access:    Peripheral IV   Procedures and diagnostic studies:   Ct Abdomen Pelvis W Contrast  08/17/2015  CLINICAL DATA:  Acute onset of generalized abdominal distention and diarrhea. Nausea, vomiting and fever. Neutropenia. Initial encounter. EXAM: CT ABDOMEN AND PELVIS WITH CONTRAST TECHNIQUE: Multidetector CT imaging of the abdomen and pelvis was performed using the standard protocol following bolus administration of intravenous contrast. CONTRAST:  41mL ISOVUE-300 IOPAMIDOL (ISOVUE-300) INJECTION 61% COMPARISON:  CT of the chest, abdomen and pelvis performed 05/14/2015 FINDINGS: Bibasilar airspace opacities may reflect atelectasis or pneumonia. Large volume ascites is noted  filling the abdomen and pelvis. Areas of vaguely decreased attenuation are noted within the liver, of uncertain significance. The spleen is borderline normal in size. Diffuse pneumobilia is noted, with a common bile duct stent noted in expected  position. The gallbladder is relatively decompressed and not well assessed due to surrounding ascites. Vague masslike density is noted about the pancreatic head, with surrounding postoperative change. The adrenal glands are grossly unremarkable in appearance Underlying splenic and gastric varices are noted. Postoperative change is noted at the stomach. Known metastatic disease along the omentum is difficult to fully assess due to compression by ascites. A right ureteral stent is noted. There is mild right renal atrophy. The kidneys are otherwise unremarkable. No significant hydronephrosis is seen. No renal or ureteral stones are identified. Evaluation for perinephric stranding is limited due to surrounding ascites. There is diffuse wall thickening involving much of the distal and terminal ileum, concerning for acute infectious or inflammatory ileitis. The remainder of the small bowel is grossly unremarkable. The stomach is within normal limits. No acute vascular abnormalities are seen. The appendix is not definitely seen; there is no evidence of appendicitis. The colon is grossly unremarkable in appearance. Vague soft tissue inflammation is noted about the rectum, of uncertain significance. The bladder is moderately distended and grossly unremarkable. The uterus is grossly unremarkable in appearance. Bilateral cystic lesions are noted at the ovaries, measuring 7.6 cm on the right and 4.2 cm on the left, of uncertain significance. No inguinal lymphadenopathy is seen. No acute osseous abnormalities are identified. IMPRESSION: 1. Large volume ascites noted filling the abdomen and pelvis, markedly increased from the prior study. 2. Diffuse wall thickening noted along the distal and terminal ileum, concerning for acute infectious or inflammatory ileitis. 3. Known metastatic disease is difficult to fully assess given the extent of underlying ascites. 4. Vague masslike density about the pancreatic head, with surrounding  postoperative change. Common bile duct stent noted in unchanged position, with underlying pneumobilia. 5. Bibasilar airspace opacities may reflect atelectasis or pneumonia. 6. Areas of vaguely decreased attenuation within the liver, of uncertain significance. Underlying metastatic disease cannot be excluded. 7. Mild right renal atrophy noted. 8. Vague nonspecific soft tissue inflammation about the rectum. 9. Bilateral cystic foci again noted at the ovaries, which could reflect metastatic disease to the ovaries from the patient's gastric primary, though the appearance is nonspecific. This is somewhat larger on the right and smaller on the left. Electronically Signed   By: Garald Balding M.D.   On: 08/17/2015 03:51   US Paracentesis  08/17/2015  INDICATION: History of metastatic gastric cancer with abdominal ascites. Request has been made for diagnostic and therapeutic paracentesis. EXAM: ULTRASOUND GUIDED DIAGNOSTIC AND THERAPEUTIC PARACENTESIS MEDICATIONS: 1% lidocaine COMPLICATIONS: None immediate. PROCEDURE: Informed written consent was obtained from the patient after a discussion of the risks, benefits and alternatives to treatment. A timeout was performed prior to the initiation of the procedure. Initial ultrasound scanning demonstrates a moderate amount of ascites within the left lower abdominal quadrant. The left lower abdomen was prepped and draped in the usual sterile fashion. 1% lidocaine was used for local anesthesia. Following this, a 19 gauge, 7-cm, Yueh catheter was introduced. An ultrasound image was saved for documentation purposes. The paracentesis was performed. The catheter was removed and a dressing was applied. The patient tolerated the procedure well without immediate post procedural complication. FINDINGS: A total of approximately 3.3 L of clear yellow fluid was removed. Samples were sent to the laboratory as requested  by the clinical team. IMPRESSION: Successful ultrasound-guided  paracentesis yielding 3.3 liters of peritoneal fluid. Read by: Saverio Danker, PA-C Electronically Signed   By: Corrie Mckusick D.O.   On: 08/17/2015 16:18   Dg Chest Port 1 View  08/16/2015  CLINICAL DATA:  Stomach cancer, diarrhea for 2 weeks, nausea, vomiting EXAM: PORTABLE CHEST 1 VIEW COMPARISON:  05/14/2015 FINDINGS: There are bilateral low lung volumes. There is bibasilar airspace disease which may reflect atelectasis versus pneumonia. There is no pleural effusion or pneumothorax. The heart and mediastinal contours are unremarkable. There is a right-sided Port-A-Cath in satisfactory position. The osseous structures are unremarkable. IMPRESSION: Low lung volumes. Bibasilar airspace disease which may reflect atelectasis versus pneumonia. Electronically Signed   By: Kathreen Devoid   On: 08/16/2015 16:59     Medical Consultants:    None.  Anti-Infectives:   Vancomycin and cefepime started on 08/16/2015.  Subjective:    Andrea Best relates that her diarrhea has improved some. She relates no further fevers feels well has an appetite this morning no further abdominal discomfort.  Objective:    Filed Vitals:   08/18/15 0300 08/18/15 0400 08/18/15 0500 08/18/15 0600  BP:  84/47  89/51  Pulse:      Temp:  98.7 F (37.1 C)    TempSrc:  Oral    Resp: 13 12 20 9   Height:      Weight:      SpO2: 98% 99% 100% 99%    Intake/Output Summary (Last 24 hours) at 08/18/15 0731 Last data filed at 08/18/15 [id number]  Gross per 24 hour  Intake 2462.5 ml  Output      0 ml  Net 2462.5 ml   Filed Weights   08/16/15 1548 08/16/15 1719 08/16/15 2235  Weight: 51.71 kg (114 lb) 51.71 kg (114 lb) 53 kg (116 lb 13.5 oz)    Exam: General exam: In no acute distress.Cachectic appearing. Respiratory system: Good air movement and clear to auscultation. Cardiovascular system: S1 & S2 heard, RRR. No JVD, murmurs, rubs, appreciated JVD Gastrointestinal system: Abdomen is nondistended, soft and  nontender.  Central nervous system: Alert and oriented. No focal neurological deficits. Extremities: No pedal edema. Skin: No rashes, lesions or ulcers Psychiatry: Judgement and insight appear normal. Mood & affect appropriate.    Data Reviewed:    Labs: Basic Metabolic Panel:  Recent Labs Lab 08/16/15 1615 08/17/15 0308 08/18/15 0435  NA 134* 135 135  K 3.3* 3.2* 3.5  CL 104 107 110  CO2 24 22 21*  GLUCOSE 111* 106* 97  BUN 9 7 6   CREATININE 0.80 0.67 0.54  CALCIUM 7.3* 7.1* 7.4*  MG 1.7  --   --   PHOS 2.3*  --   --    GFR Estimated Creatinine Clearance: 76.6 mL/min (by C-G formula based on Cr of 0.54). Liver Function Tests:  Recent Labs Lab 08/16/15 1615 08/18/15 0435  AST 16 11*  ALT 9* 7*  ALKPHOS 68 53  BILITOT 0.7 0.6  PROT 5.6* 4.3*  ALBUMIN 1.8* 1.4*   No results for input(s): LIPASE, AMYLASE in the last 168 hours. No results for input(s): AMMONIA in the last 168 hours. Coagulation profile  Recent Labs Lab 08/16/15 2259  INR 1.31    CBC:  Recent Labs Lab 08/16/15 1615 08/17/15 0308 08/18/15 0435  WBC 0.5* 0.4* 1.0*  NEUTROABS 0.0*  --   --   HGB 6.4* 7.0* 6.2*  HCT 19.4* 20.9* 18.9*  MCV 84.7 82.9  83.3  PLT 70* 73* 88*   Cardiac Enzymes: No results for input(s): CKTOTAL, CKMB, CKMBINDEX, TROPONINI in the last 168 hours. BNP (last 3 results) No results for input(s): PROBNP in the last 8760 hours. CBG: No results for input(s): GLUCAP in the last 168 hours. D-Dimer: No results for input(s): DDIMER in the last 72 hours. Hgb A1c: No results for input(s): HGBA1C in the last 72 hours. Lipid Profile: No results for input(s): CHOL, HDL, LDLCALC, TRIG, CHOLHDL, LDLDIRECT in the last 72 hours. Thyroid function studies: No results for input(s): TSH, T4TOTAL, T3FREE, THYROIDAB in the last 72 hours.  Invalid input(s): FREET3 Anemia work up: No results for input(s): VITAMINB12, FOLATE, FERRITIN, TIBC, IRON, RETICCTPCT in the last 72  hours. Sepsis Labs:  Recent Labs Lab 08/16/15 1615 08/16/15 1834 08/16/15 2259 08/17/15 0308 08/18/15 0435  PROCALCITON  --   --  2.79  --   --   WBC 0.5*  --   --  0.4* 1.0*  LATICACIDVEN  --  2.00 2.4* 1.6  --    Microbiology Recent Results (from the past 240 hour(s))  Culture, blood (Routine X 2)     Status: None (Preliminary result)   Collection Time: 08/16/15  4:13 PM  Result Value Ref Range Status   Specimen Description BLOOD LEFT FOREARM  Final   Special Requests BOTTLES DRAWN AEROBIC AND ANAEROBIC 5CC  Final   Culture   Final    NO GROWTH < 24 HOURS Performed at [facility]    Report Status PENDING  Incomplete  Culture, blood (Routine X 2)     Status: None (Preliminary result)   Collection Time: 08/16/15  4:14 PM  Result Value Ref Range Status   Specimen Description BLOOD RIGHT ANTECUBITAL  Final   Special Requests BOTTLES DRAWN AEROBIC AND ANAEROBIC 5CC  Final   Culture   Final    NO GROWTH < 24 HOURS Performed at [facility]    Report Status PENDING  Incomplete  C difficile quick scan w PCR reflex     Status: None   Collection Time: 08/16/15  4:53 PM  Result Value Ref Range Status   C Diff antigen NEGATIVE NEGATIVE Final   C Diff toxin NEGATIVE NEGATIVE Final   C Diff interpretation Negative for toxigenic C. difficile  Final  MRSA PCR Screening     Status: None   Collection Time: 08/17/15 12:27 AM  Result Value Ref Range Status   MRSA by PCR NEGATIVE NEGATIVE Final    Comment:        The GeneXpert MRSA Assay (FDA approved for NASAL specimens only), is one component of a comprehensive MRSA colonization surveillance program. It is not intended to diagnose MRSA infection nor to guide or monitor treatment for MRSA infections.   Gram stain     Status: None   Collection Time: 08/17/15  4:01 PM  Result Value Ref Range Status   Specimen Description FLUID ABDOMEN PERITONEAL  Final   Special Requests NONE  Final   Gram Stain   Final     FEW WBC PRESENT, PREDOMINANTLY MONONUCLEAR NO ORGANISMS SEEN Performed at [facility]    Report Status 08/17/2015 FINAL  Final     Medications:   . ceFEPime (MAXIPIME) IV  2 g Intravenous Q8H  . DULoxetine  30 mg Oral Daily  . feeding supplement  1 Container Oral BID BM  . feeding supplement (ENSURE ENLIVE)  237 mL Oral Q24H  . morphine  30  mg Oral Q12H  . potassium chloride SA  20 mEq Oral Daily  . sodium chloride flush  10-40 mL Intracatheter Q12H  . vancomycin  750 mg Intravenous Q12H   Continuous Infusions: . sodium chloride 100 mL/hr at 08/18/15 0600    Time spent: 25 min   LOS: 2 days   Charlynne Cousins  [facility] Pager [phone]  *Please refer to [email or web address], password [email or web address] to get updated schedule on who will round on this patient, as hospitalists switch teams weekly. If 7PM-7AM, please contact night-coverage at [email or web address], password [email or web address] for any overnight needs.  08/18/2015, 7:31 AM

## 2015-08-18 NOTE — Progress Notes (Signed)
PT Cancellation Note  Patient Details Name: JODEL SCIANDRA MRN: [id number] DOB: 1970-05-09   Cancelled Treatment:    Reason Eval/Treat Not Completed: Fatigue/lethargy limiting ability to participate (pt sleeping on arrival, daughter requests to allow pt to rest)   Tashera Montalvo,KATHrine E 08/18/2015, 2:51 PM Carmelia Bake, PT, DPT 08/18/2015 Pager: [phone]

## 2015-08-18 NOTE — Progress Notes (Signed)
Nutrition Follow-up  DOCUMENTATION CODES:   Severe malnutrition in context of chronic illness  INTERVENTION:  - Continue Boost Breeze BID and Ensure Enlive once/day - Continue to encourage PO intakes of meals and supplements - RD will continue to monitor for additional needs  NUTRITION DIAGNOSIS:   Increased nutrient needs related to catabolic illness, cancer and cancer related treatments as evidenced by estimated needs. -ongoing  GOAL:   Patient will meet greater than or equal to 90% of their needs -unmet  MONITOR:   PO intake, Supplement acceptance, Weight trends, Labs, I & O's  ASSESSMENT:   45 y.o. female with medical history significant of gastric cancer with gastric outlet obstruction, malnutrition, lupus, Sjogren's syndrome, chronic anemia secondary to iron deficiency as well as antineoplastic chemotherapy-induced anemia  5/24 No intakes documented since yesterday. Visualized breakfast tray with 50% of a piece of [place] toast and of sausage consumed; pt saved yogurt from her tray and also requested jello from RD. Pt states that she has not been provided with Boost Breeze since order was placed yesterday. Provided pt with peach flavor of this supplement mixed with Sprite and pt enjoyed this and would like to receive it BID; prefers peach flavor at this time.   Pt had paracentesis 5/23 with 3.3L removed; pt reports decreased feelings of pressure and fullness related to this.   Not meeting needs. Will continue to monitor for POC/GOC and associated needs. Medications reviewed; 20 mEq oral KCl/day. IVF: NS @ 100 mL/hr. Labs reviewed; Ca: 7.4 mg/dL.   5/23 - Visualized breakfast tray with 25-50% completion.  - Pt with hx of gastric cancer and gastric outlet obstruction; she is receiving chemo treatment and last chemo was 08/05/15.   - Pt states that over the past few weeks she has been becoming full quicker stating that she is eating half the amount of any one item..  - Pt  denies abdominal pain associated with eating/drinking or feelings of fullness.  - She denies nausea/vomiting until 2 days ago.  - Pt also denies any issues with bowel habits (including diarrhea) while on chemo until she began having diarrhea 1 week PTA.  - Pt states that acidic items irritate her throat. - She indicates that she does better with liquids than solid foods as it relates to feelings of fullness.  - Pt does not particularly like Ensure Enlive. Talked with pt about Boost Breeze and she is interested in receiving this supplement. - Physical assessment done to upper body only with respect to pt comfort and indicates severe muscle and fat wasting.  - Abdominal distention present; pt and family feel this is contributing to early satiety.  - Per chart review, pt has lost 23 lbs (16.5% body weight) in the past 2.5 months which is significant for time frame.  - Not able to meet needs with decreased gastric space; will monitor for need for education concerning diet.   Diet Order:  Diet Heart Room service appropriate?: Yes; Fluid consistency:: Thin  Skin:  Reviewed, no issues  Last BM:  5/23  Height:   Ht Readings from Last 1 Encounters:  08/16/15 5\' 5"  (1.651 m)    Weight:   Wt Readings from Last 1 Encounters:  08/16/15 116 lb 13.5 oz (53 kg)    Ideal Body Weight:  56.82 kg (kg)  BMI:  Body mass index is 19.44 kg/(m^2).  Estimated Nutritional Needs:   Kcal:  1600-1850 (~30-35 kcal/kg)  Protein:  74-85 grams (1.4-1.6 grams/kg)  Fluid:  1.7-1.9  L/day  EDUCATION NEEDS:   No education needs identified at this time     Jarome Matin, [place], [facility] Inpatient Clinical Dietitian Pager # [phone] After hours/weekend pager # [phone]

## 2015-08-18 NOTE — Progress Notes (Signed)
CRITICAL VALUE ALERT  Critical value received:  WBC 1.0 , Hgb 6.2  Date of notification:  08/18/15  Time of notification:  0502  Critical value read back: yes  Nurse who received alert:  Marye Round, RN  MD notified (1st page):  L. Harduk, MD  Time of first page: [phone]  MD notified (2nd page): L. Harduk, MD  Time of second page: [phone]  Responding MD:  Roger Shelter  Time MD [id number]  Cancer patient is also Jehovah witness whom does not receive blood. No action will be taken at this time. Patient has received Aranesp (treats anemia) on 5/23. Cancer patient is expected to have low WBC.

## 2015-08-18 NOTE — Progress Notes (Signed)
IP PROGRESS NOTE  Subjective:   Ms. Macaraeg reports feeling better after the paracentesis yesterday. No pain this morning. She continues to have diarrhea. Objective: Vital signs in last 24 hours: Blood pressure 89/51, pulse 106, temperature 97.5 F (36.4 C), temperature source Oral, resp. rate 9, height 5\' 5"  (1.651 m), weight 116 lb 13.5 oz (53 kg), SpO2 99 %.  Intake/Output from previous day: 05/23 0701 - 05/24 0700 In: 2562.5 [P.O.:250; I.V.:1862.5; IV Piggyback:450] Out: -   Physical Exam:  HEENT: No thrush  Abdomen: Less distended, nontender, no mass Extremities: No leg edema  Portacath/PICC-without erythema  Lab Results:  Recent Labs  08/17/15 0308 08/18/15 0435  WBC 0.4* 1.0*  HGB 7.0* 6.2*  HCT 20.9* 18.9*  PLT 73* 88*    BMET  Recent Labs  08/17/15 0308 08/18/15 0435  NA 135 135  K 3.2* 3.5  CL 107 110  CO2 22 21*  GLUCOSE 106* 97  BUN 7 6  CREATININE 0.67 0.54  CALCIUM 7.1* 7.4*    Studies/Results: Ct Abdomen Pelvis W Contrast  08/17/2015  CLINICAL DATA:  Acute onset of generalized abdominal distention and diarrhea. Nausea, vomiting and fever. Neutropenia. Initial encounter. EXAM: CT ABDOMEN AND PELVIS WITH CONTRAST TECHNIQUE: Multidetector CT imaging of the abdomen and pelvis was performed using the standard protocol following bolus administration of intravenous contrast. CONTRAST:  26mL ISOVUE-300 IOPAMIDOL (ISOVUE-300) INJECTION 61% COMPARISON:  CT of the chest, abdomen and pelvis performed 05/14/2015 FINDINGS: Bibasilar airspace opacities may reflect atelectasis or pneumonia. Large volume ascites is noted filling the abdomen and pelvis. Areas of vaguely decreased attenuation are noted within the liver, of uncertain significance. The spleen is borderline normal in size. Diffuse pneumobilia is noted, with a common bile duct stent noted in expected position. The gallbladder is relatively decompressed and not well assessed due to surrounding ascites.  Vague masslike density is noted about the pancreatic head, with surrounding postoperative change. The adrenal glands are grossly unremarkable in appearance Underlying splenic and gastric varices are noted. Postoperative change is noted at the stomach. Known metastatic disease along the omentum is difficult to fully assess due to compression by ascites. A right ureteral stent is noted. There is mild right renal atrophy. The kidneys are otherwise unremarkable. No significant hydronephrosis is seen. No renal or ureteral stones are identified. Evaluation for perinephric stranding is limited due to surrounding ascites. There is diffuse wall thickening involving much of the distal and terminal ileum, concerning for acute infectious or inflammatory ileitis. The remainder of the small bowel is grossly unremarkable. The stomach is within normal limits. No acute vascular abnormalities are seen. The appendix is not definitely seen; there is no evidence of appendicitis. The colon is grossly unremarkable in appearance. Vague soft tissue inflammation is noted about the rectum, of uncertain significance. The bladder is moderately distended and grossly unremarkable. The uterus is grossly unremarkable in appearance. Bilateral cystic lesions are noted at the ovaries, measuring 7.6 cm on the right and 4.2 cm on the left, of uncertain significance. No inguinal lymphadenopathy is seen. No acute osseous abnormalities are identified. IMPRESSION: 1. Large volume ascites noted filling the abdomen and pelvis, markedly increased from the prior study. 2. Diffuse wall thickening noted along the distal and terminal ileum, concerning for acute infectious or inflammatory ileitis. 3. Known metastatic disease is difficult to fully assess given the extent of underlying ascites. 4. Vague masslike density about the pancreatic head, with surrounding postoperative change. Common bile duct stent noted in unchanged position, with  underlying pneumobilia. 5.  Bibasilar airspace opacities may reflect atelectasis or pneumonia. 6. Areas of vaguely decreased attenuation within the liver, of uncertain significance. Underlying metastatic disease cannot be excluded. 7. Mild right renal atrophy noted. 8. Vague nonspecific soft tissue inflammation about the rectum. 9. Bilateral cystic foci again noted at the ovaries, which could reflect metastatic disease to the ovaries from the patient's gastric primary, though the appearance is nonspecific. This is somewhat larger on the right and smaller on the left. Electronically Signed   By: Garald Balding M.D.   On: 08/17/2015 03:51   US Paracentesis  08/17/2015  INDICATION: History of metastatic gastric cancer with abdominal ascites. Request has been made for diagnostic and therapeutic paracentesis. EXAM: ULTRASOUND GUIDED DIAGNOSTIC AND THERAPEUTIC PARACENTESIS MEDICATIONS: 1% lidocaine COMPLICATIONS: None immediate. PROCEDURE: Informed written consent was obtained from the patient after a discussion of the risks, benefits and alternatives to treatment. A timeout was performed prior to the initiation of the procedure. Initial ultrasound scanning demonstrates a moderate amount of ascites within the left lower abdominal quadrant. The left lower abdomen was prepped and draped in the usual sterile fashion. 1% lidocaine was used for local anesthesia. Following this, a 19 gauge, 7-cm, Yueh catheter was introduced. An ultrasound image was saved for documentation purposes. The paracentesis was performed. The catheter was removed and a dressing was applied. The patient tolerated the procedure well without immediate post procedural complication. FINDINGS: A total of approximately 3.3 L of clear yellow fluid was removed. Samples were sent to the laboratory as requested by the clinical team. IMPRESSION: Successful ultrasound-guided paracentesis yielding 3.3 liters of peritoneal fluid. Read by: Saverio Danker, PA-C Electronically Signed   By: Corrie Mckusick D.O.   On: 08/17/2015 16:18   Dg Chest Port 1 View  08/16/2015  CLINICAL DATA:  Stomach cancer, diarrhea for 2 weeks, nausea, vomiting EXAM: PORTABLE CHEST 1 VIEW COMPARISON:  05/14/2015 FINDINGS: There are bilateral low lung volumes. There is bibasilar airspace disease which may reflect atelectasis versus pneumonia. There is no pleural effusion or pneumothorax. The heart and mediastinal contours are unremarkable. There is a right-sided Port-A-Cath in satisfactory position. The osseous structures are unremarkable. IMPRESSION: Low lung volumes. Bibasilar airspace disease which may reflect atelectasis versus pneumonia. Electronically Signed   By: Kathreen Devoid   On: 08/16/2015 16:59    Medications: I have reviewed the patient's current medications.  Assessment/Plan:  1. 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 Recurrent admissions for gram-negative sepsis  4. Pancytopenia secondary to chemotherapy-on G-CSF, white count recovering  5. Diarrhea-likely secondary to chemotherapy  6. Fever in the setting of severe neutropenia-cultures negative to date  7. Anorexia/weight loss  8. Pain secondary to carcinomatosis  9. Ascites-malignant ascites confirmed on cytology at [facility] 06/09/2015, status post a palliative paracentesis 08/17/2015  She appears unchanged. She has metastatic gastric cancer with carcinomatosis and malignant ascites. I reviewed records from [place]. I doubt she is benefiting from the FOLFIRI/ramucirumab. I explained she is receiving "standard" treatment that could be given here. She plans to return to [facility] to  continue treatment. I will review the current CT to look for evidence of a response to treatment.   Recommendations: 1.  Continue antibiotics, follow-up cultures 2. G-CSF until the [place] is greater than 1000 3. Continue MS Contin and Roxanol for pain   LOS: 2 days   Betsy Coder, MD   08/18/2015, 8:36 AM

## 2015-08-19 LAB — CBC WITH DIFFERENTIAL/PLATELET
BAND NEUTROPHILS: 55 %
BASOS ABS: 0 10*3/uL (ref 0.0–0.1)
BLASTS: 0 %
Basophils Relative: 0 %
EOS ABS: 0 10*3/uL (ref 0.0–0.7)
Eosinophils Relative: 0 %
HEMATOCRIT: 22.6 % — AB (ref 36.0–46.0)
Hemoglobin: 7.2 g/dL — ABNORMAL LOW (ref 12.0–15.0)
LYMPHS ABS: 1.3 10*3/uL (ref 0.7–4.0)
LYMPHS PCT: 29 %
MCH: 27.7 pg (ref 26.0–34.0)
MCHC: 31.9 g/dL (ref 30.0–36.0)
MCV: 86.9 fL (ref 78.0–100.0)
MONO ABS: 0.3 10*3/uL (ref 0.1–1.0)
Metamyelocytes Relative: 5 %
Monocytes Relative: 6 %
Myelocytes: 4 %
NEUTROS ABS: 2.9 10*3/uL (ref 1.7–7.7)
NEUTROS PCT: 1 %
Other: 0 %
PLATELETS: 189 10*3/uL (ref 150–400)
PROMYELOCYTES ABS: 0 %
RBC: 2.6 MIL/uL — AB (ref 3.87–5.11)
RDW: 23.1 % — AB (ref 11.5–15.5)
WBC: 4.5 10*3/uL (ref 4.0–10.5)
nRBC: 0 /100 WBC

## 2015-08-19 LAB — URINE CULTURE: Culture: <10000 — AB

## 2015-08-19 NOTE — Progress Notes (Signed)
[facility] PROGRESS NOTE    Progress Note  CRISTABELLA SLECHTA  [id number] DOB: [birth year] DOA: 08/16/2015 PCP: Aretta Nip, MD     Brief Narrative:   Andrea Best is an 45 y.o. 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 The came into the hospital for 1 week of diarrhea and fevers, came into the hospital via EMS was started on the sepsis protocol with antibiotics and Neupogen.  Assessment/Plan:   Sepsis (HCC)/Neutropenic fever: She was started on admission on empiric antibiotics and Neupogen. As discussing with her blood pressure always runs in the 90s-100s at home. Sores unclear: - she has had biliary stents in the past but billi within normal.  CT scan of the abdomen and pelvis showed edema along the terminal ileum. Tolerating her diet. Paracentesis done  08/17/2015, which yields with about 3 L, less than 250 PMNs. C. difficile PCR was negative, BC and body fluid negative till date. Leucopenia resolved, cont IV empiric antibiotics.  Chemotherapy-induced diarrhea: C. difficile PCR negative resolving. Start Florastorand Imodium.  Metastatic gastric carcinoma: Currently under chemotherapy followed by Dr. Malachy Mood Overall prognosis is poor. She continues to be a full code and wishes aggressive treatment. Family will like to meet with PMT.  Iron deficiency anemia/  Antineoplastic chemotherapy induced anemia: Today her hemoglobin is below 7, she was given IV Aranesp and IV iron Cont monitor Hbg.  Severe protein caloric malnutrition: continue supplementation as tolerated.  Chronic back pain. Continue narcotics.  Pancytopenia: Secondary to chemotherapy, she has received Neupogen and Aranesp.  Improved.      DVT prophylaxis: SCD's Family Communication:none Disposition Plan/Barrier to  D/C: home in 2 days Code Status:  Code Status History    Date Active Date Inactive Code Status Order ID Comments User Context   01/20/2015  6:16 PM 02/05/2015  2:55 PM Full Code [id number]  Hosie Poisson, MD Inpatient   01/06/2015  2:43 PM 01/11/2015  7:17 PM Full Code [id number]  Erick Colace, NP ED   06/24/2014  1:29 PM 06/25/2014  3:43 AM Full Code [id number]  Greggory Keen, MD HOV   10/21/2013 11:16 AM 10/27/2013  8:14 PM Full Code [id number]  Marybelle Killings, MD Inpatient   10/08/2013 11:25 PM 10/21/2013 11:16 AM Full Code [id number]  Rise Patience, MD Inpatient    Advance Directive Documentation        Most Recent Value   Type of Advance Directive  Healthcare Power of Attorney   Pre-existing out of facility DNR order (yellow form or pink MOST form)     "MOST" Form in Place?          IV Access:    Peripheral IV   Procedures and diagnostic studies:   US Paracentesis  08/17/2015  INDICATION: History of metastatic gastric cancer with abdominal ascites. Request has been made for diagnostic and therapeutic paracentesis. EXAM: ULTRASOUND GUIDED DIAGNOSTIC AND THERAPEUTIC PARACENTESIS MEDICATIONS: 1% lidocaine COMPLICATIONS: None immediate. PROCEDURE: Informed written consent was obtained from the patient after a discussion of the risks, benefits and alternatives to treatment. A timeout was performed prior to the initiation of the procedure. Initial ultrasound scanning demonstrates a moderate amount of ascites within the left lower abdominal quadrant. The left lower abdomen was prepped and draped in the usual sterile fashion. 1% lidocaine was used for local anesthesia. Following this, a 19 gauge, 7-cm, Yueh catheter was introduced. An  ultrasound image was saved for documentation purposes. The paracentesis was performed. The catheter was removed and a dressing was applied. The patient tolerated the procedure well without immediate post procedural complication. FINDINGS: A total of approximately 3.3  L of clear yellow fluid was removed. Samples were sent to the laboratory as requested by the clinical team. IMPRESSION: Successful ultrasound-guided paracentesis yielding 3.3 liters of peritoneal fluid. Read by: Saverio Danker, PA-C Electronically Signed   By: Corrie Mckusick D.O.   On: 08/17/2015 16:18     Medical Consultants:    None.  Anti-Infectives:   Vancomycin and cefepime started on 08/16/2015.  Subjective:    [name] relates that her diarrhea has improved some. asymptomatic  Objective:    Filed Vitals:   08/18/15 1524 08/18/15 1659 08/18/15 2145 08/19/15 0545  BP: 94/49 93/72 91/62  89/50  Pulse:  97 118 120  Temp: 98.1 F (36.7 C) 98.2 F (36.8 C) 97.9 F (36.6 C) 98.5 F (36.9 C)  TempSrc: Oral Oral Oral Oral  Resp: 14 16    Height:  5\' 5"  (1.651 m)    Weight:  54.2 kg (119 lb 7.8 oz)    SpO2: 100% 91% 100% 100%    Intake/Output Summary (Last 24 hours) at 08/19/15 0720 Last data filed at 08/19/15 0408  Gross per 24 hour  Intake 2903.33 ml  Output    450 ml  Net 2453.33 ml   Filed Weights   08/16/15 1719 08/16/15 2235 08/18/15 1659  Weight: 51.71 kg (114 lb) 53 kg (116 lb 13.5 oz) 54.2 kg (119 lb 7.8 oz)    Exam: General exam: In no acute distress.Cachectic appearing. Respiratory system: Good air movement and clear to auscultation. Cardiovascular system: S1 & S2 heard, RRR. No JVD, murmurs, rubs, appreciated JVD Gastrointestinal system: Abdomen is nondistended, soft and nontender.  Central nervous system: Alert and oriented. No focal neurological deficits. Extremities: No pedal edema. Skin: No rashes, lesions or ulcers    Data Reviewed:    Labs: Basic Metabolic Panel:  Recent Labs Lab 08/16/15 1615 08/17/15 0308 08/18/15 0435  NA 134* 135 135  K 3.3* 3.2* 3.5  CL 104 107 110  CO2 24 22 21*  GLUCOSE 111* 106* 97  BUN 9 7 6   CREATININE 0.80 0.67 0.54  CALCIUM 7.3* 7.1* 7.4*  MG 1.7  --   --   PHOS 2.3*  --   --     GFR Estimated Creatinine Clearance: 78.4 mL/min (by C-G formula based on Cr of 0.54). Liver Function Tests:  Recent Labs Lab 08/16/15 1615 08/18/15 0435  AST 16 11*  ALT 9* 7*  ALKPHOS 68 53  BILITOT 0.7 0.6  PROT 5.6* 4.3*  ALBUMIN 1.8* 1.4*   No results for input(s): LIPASE, AMYLASE in the last 168 hours. No results for input(s): AMMONIA in the last 168 hours. Coagulation profile  Recent Labs Lab 08/16/15 2259  INR 1.31    CBC:  Recent Labs Lab 08/16/15 1615 08/17/15 0308 08/18/15 0435 08/19/15 0420  WBC 0.5* 0.4* 1.0* 4.5  NEUTROABS 0.0*  --  0.1* 2.9  HGB 6.4* 7.0* 6.2* 7.2*  HCT 19.4* 20.9* 18.9* 22.6*  MCV 84.7 82.9 83.3 86.9  PLT 70* 73* 88* 189   Cardiac Enzymes: No results for input(s): CKTOTAL, CKMB, CKMBINDEX, TROPONINI in the last 168 hours. BNP (last 3 results) No results for input(s): PROBNP in the last 8760 hours. CBG: No results for input(s): GLUCAP in the last 168 hours. D-Dimer: No  results for input(s): DDIMER in the last 72 hours. Hgb A1c: No results for input(s): HGBA1C in the last 72 hours. Lipid Profile: No results for input(s): CHOL, HDL, LDLCALC, TRIG, CHOLHDL, LDLDIRECT in the last 72 hours. Thyroid function studies: No results for input(s): TSH, T4TOTAL, T3FREE, THYROIDAB in the last 72 hours.  Invalid input(s): FREET3 Anemia work up: No results for input(s): VITAMINB12, FOLATE, FERRITIN, TIBC, IRON, RETICCTPCT in the last 72 hours. Sepsis Labs:  Recent Labs Lab 08/16/15 1615 08/16/15 1834 08/16/15 2259 08/17/15 0308 08/18/15 0435 08/19/15 0420  PROCALCITON  --   --  2.79  --   --   --   WBC 0.5*  --   --  0.4* 1.0* 4.5  LATICACIDVEN  --  2.00 2.4* 1.6  --   --    Microbiology Recent Results (from the past 240 hour(s))  Culture, blood (Routine X 2)     Status: None (Preliminary result)   Collection Time: 08/16/15  4:13 PM  Result Value Ref Range Status   Specimen Description BLOOD LEFT FOREARM  Final   Special  Requests BOTTLES DRAWN AEROBIC AND ANAEROBIC 5CC  Final   Culture   Final    NO GROWTH 2 DAYS Performed at [facility]    Report Status PENDING  Incomplete  Culture, blood (Routine X 2)     Status: None (Preliminary result)   Collection Time: 08/16/15  4:14 PM  Result Value Ref Range Status   Specimen Description BLOOD RIGHT ANTECUBITAL  Final   Special Requests BOTTLES DRAWN AEROBIC AND ANAEROBIC 5CC  Final   Culture   Final    NO GROWTH 2 DAYS Performed at [facility]    Report Status PENDING  Incomplete  C difficile quick scan w PCR reflex     Status: None   Collection Time: 08/16/15  4:53 PM  Result Value Ref Range Status   C Diff antigen NEGATIVE NEGATIVE Final   C Diff toxin NEGATIVE NEGATIVE Final   C Diff interpretation Negative for toxigenic C. difficile  Final  MRSA PCR Screening     Status: None   Collection Time: 08/17/15 12:27 AM  Result Value Ref Range Status   MRSA by PCR NEGATIVE NEGATIVE Final    Comment:        The GeneXpert MRSA Assay (FDA approved for NASAL specimens only), is one component of a comprehensive MRSA colonization surveillance program. It is not intended to diagnose MRSA infection nor to guide or monitor treatment for MRSA infections.   Culture, body fluid-bottle     Status: None (Preliminary result)   Collection Time: 08/17/15  4:01 PM  Result Value Ref Range Status   Specimen Description FLUID ABDOMEN PERITONEAL  Final   Special Requests BOTTLES DRAWN AEROBIC AND ANAEROBIC 10CC  Final   Culture   Final    NO GROWTH < 24 HOURS Performed at [facility]    Report Status PENDING  Incomplete  Gram stain     Status: None   Collection Time: 08/17/15  4:01 PM  Result Value Ref Range Status   Specimen Description FLUID ABDOMEN PERITONEAL  Final   Special Requests NONE  Final   Gram Stain   Final    FEW WBC PRESENT, PREDOMINANTLY MONONUCLEAR NO ORGANISMS SEEN Performed at [facility]    Report Status  08/17/2015 FINAL  Final     Medications:   . sodium chloride   Intravenous Once  . ceFEPime (MAXIPIME) IV  2 g Intravenous Q8H  . DULoxetine  30 mg Oral Daily  . feeding supplement  1 Container Oral BID BM  . feeding supplement (ENSURE ENLIVE)  237 mL Oral Q24H  . morphine  30 mg Oral Q12H  . potassium chloride SA  20 mEq Oral Daily  . saccharomyces boulardii  250 mg Oral BID  . sodium chloride flush  10-40 mL Intracatheter Q12H  . vancomycin  750 mg Intravenous Q12H   Continuous Infusions: . sodium chloride 100 mL/hr at 08/19/15 0324    Time spent: 15 min   LOS: 3 days   Charlynne Cousins  [facility] Pager [phone]  *Please refer to [email or web address], password [email or web address] to get updated schedule on who will round on this patient, as hospitalists switch teams weekly. If 7PM-7AM, please contact night-coverage at [email or web address], password [email or web address] for any overnight needs.  08/19/2015, 7:20 AM

## 2015-08-19 NOTE — Progress Notes (Signed)
Pharmacy Antibiotic Note  Andrea Best is a 45 y.o. female with PMHx gastric cancer currently being treated at the [facility] in [place], [place] admitted on 08/16/2015 with diarrhea, emesis, hypotension, and fever.  ANC noted to be 0.0.  Pharmacy has been consulted for Vancomycin and Cefepime for sepsis and febrile neutropenia.    Plan: Continue Vanc 750mg  IV q12h Continue Cefepime 2g IV q8h Will not obtain vanc trough at this time   Height: 5\' 5"  (165.1 cm) Weight: 119 lb 7.8 oz (54.2 kg) IBW/kg (Calculated) : 57  Temp (24hrs), Avg:98.2 F (36.8 C), Min:97.9 F (36.6 C), Max:98.5 F (36.9 C)   Recent Labs Lab 08/16/15 1615 08/16/15 1834 08/16/15 2259 08/17/15 0308 08/18/15 0435 08/19/15 0420  WBC 0.5*  --   --  0.4* 1.0* 4.5  CREATININE 0.80  --   --  0.67 0.54  --   LATICACIDVEN  --  2.00 2.4* 1.6  --   --     Estimated Creatinine Clearance: 78.4 mL/min (by C-G formula based on Cr of 0.54).    Allergies  Allergen Reactions  . Compazine [Prochlorperazine Maleate] Other (See Comments)    Dystonia  . Azithromycin Rash  . Sulfa Antibiotics Rash    hallucinations  . Hydrocodone-Acetaminophen Nausea And Vomiting    Hallucinating   . 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 SHE DOES NOT WANT ANY BLOOD transfusions but will accept Albumin,     Antimicrobials this admission: 5/22 Vancomcyin >>  5/22 Zosyn x1 5/22 Flaygl x1 5/22 Cefepime >>  Dose adjustments this admission: Previous VT =17.7 on 10/22/13 on Vanc 1g q8h with SCr 0.4-0.5  Microbiology results: 5/22 BCx: NGTD 5/24 UCx: insignificant growth 5/22 CDiff: negative  Thank you for allowing pharmacy to be a part of this patient's care.  Dolly Rias RPh 08/19/2015, 11:24 AM Pager [phone]

## 2015-08-19 NOTE — Progress Notes (Signed)
Pt transferred to 3W.  Chart reviewed and no CM needs noted or communicated at this time.  PT did not recommend HHPT or any DME.  CM will continue to follow for DC needs. Marney Doctor RN,BSN,NCM [phone]

## 2015-08-19 NOTE — Progress Notes (Signed)
IP PROGRESS NOTE  Subjective:  No new complaint. No pain.  Objective: Vital signs in last 24 hours: Blood pressure 89/50, pulse 120, temperature 98.5 F (36.9 C), temperature source Oral, resp. rate 16, height 5\' 5"  (1.651 m), weight 119 lb 7.8 oz (54.2 kg), SpO2 100 %.  Intake/Output from previous day: 05/24 0701 - 05/25 0700 In: 2903.3 [P.O.:440; I.V.:2113.3; IV Piggyback:350] Out: 450 [Urine:450]  Physical Exam:  HEENT: No thrush  Abdomen: Distended with ascites, nontender, no mass Extremities: No leg edema  Portacath/PICC-without erythema  Lab Results:  Recent Labs  08/18/15 0435 08/19/15 0420  WBC 1.0* 4.5  HGB 6.2* 7.2*  HCT 18.9* 22.6*  PLT 88* 189    BMET  Recent Labs  08/17/15 0308 08/18/15 0435  NA 135 135  K 3.2* 3.5  CL 107 110  CO2 22 21*  GLUCOSE 106* 97  BUN 7 6  CREATININE 0.67 0.54  CALCIUM 7.1* 7.4*    Studies/Results: US Paracentesis  08/17/2015  INDICATION: History of metastatic gastric cancer with abdominal ascites. Request has been made for diagnostic and therapeutic paracentesis. EXAM: ULTRASOUND GUIDED DIAGNOSTIC AND THERAPEUTIC PARACENTESIS MEDICATIONS: 1% lidocaine COMPLICATIONS: None immediate. PROCEDURE: Informed written consent was obtained from the patient after a discussion of the risks, benefits and alternatives to treatment. A timeout was performed prior to the initiation of the procedure. Initial ultrasound scanning demonstrates a moderate amount of ascites within the left lower abdominal quadrant. The left lower abdomen was prepped and draped in the usual sterile fashion. 1% lidocaine was used for local anesthesia. Following this, a 19 gauge, 7-cm, Yueh catheter was introduced. An ultrasound image was saved for documentation purposes. The paracentesis was performed. The catheter was removed and a dressing was applied. The patient tolerated the procedure well without immediate post procedural complication. FINDINGS: A total of  approximately 3.3 L of clear yellow fluid was removed. Samples were sent to the laboratory as requested by the clinical team. IMPRESSION: Successful ultrasound-guided paracentesis yielding 3.3 liters of peritoneal fluid. Read by: Saverio Danker, PA-C Electronically Signed   By: Corrie Mckusick D.O.   On: 08/17/2015 16:18    Medications: I have reviewed the patient's current medications.  Assessment/Plan:  1. 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 Recurrent admissions for gram-negative sepsis  4. Pancytopenia secondary to chemotherapy-on G-CSF, white count recovering  5. Diarrhea-likely secondary to chemotherapy  6. Fever in the setting of severe neutropenia-cultures negative to date  7. Anorexia/weight loss  8. Pain secondary to carcinomatosis  9. Ascites-malignant ascites confirmed on cytology at [facility] 06/09/2015, status post a palliative paracentesis 08/17/2015  She appears stable. I reviewed the 08/17/2015 abdomen/pelvis CT in radiology. The cystic pelvic masses appear slightly larger and likely represent metastases. There is progressive ascites. No other measurable disease.  The white count has recovered.  I discussed the situation with Ms. Brazell and her mother. They plan to continue oncology care in [place]. She does not wish to schedule a follow-up appointment here. I'm available to see her in the future as needed.  Recommendations: 1. Continue antibiotics for a seven-day course, can switch to oral antibiotics 2. Discontinue G-CSF 3. Continue MS Contin and Roxanol for pain 4.  Please call Oncology as needed    LOS: 3 days   Betsy Coder, MD   08/19/2015, 8:38 AM

## 2015-08-19 NOTE — Evaluation (Signed)
Physical Therapy Evaluation Patient Details Name: Andrea Best MRN: [id number] DOB: 04/09/1970 Today's Date: 08/19/2015   History of Present Illness  45 y.o. 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 Pt currently with functional limitations due to the deficits listed below (see PT Problem List).  Pt will benefit from skilled PT to increase their independence and safety with mobility to allow discharge to the venue listed below.   Pt has family assist at home and reports receiving PT during her visits to [facility] in [place].  Pt provided their HEP handout which contained lower level of exercises with appropriate guidance so recommended pt try to continue this at home as tolerated and discuss how she is progressing or not progressing upon her next visit (family member states she does the exercises when in [place] but does not continue them upon return home).  Will f/u in acute care however pt has family assist and HEP upon d/c so no f/u needs upon d/c.     Follow Up Recommendations No PT follow up    Equipment Recommendations  None recommended by PT    Recommendations for Other Services       Precautions / Restrictions Precautions Precautions: Fall      Mobility  Bed Mobility Overal bed mobility: Modified Independent                Transfers Overall transfer level: Needs assistance Equipment used: None Transfers: Sit to/from Stand Sit to Stand: Min guard         General transfer comment: min/guard for safety, pt reports sometimes she feels dizzy upon standing however aware to wait for dizziness to resolve prior to continuing with  mobility  Ambulation/Gait Ambulation/Gait assistance: Min guard Ambulation Distance (Feet): 400 Feet Assistive device: None Gait Pattern/deviations: Step-through pattern;Decreased stride length     General Gait Details: pt pushed IV pole, reports fatigue has been a problem, encouraged pt to ambulate as tolerated throughout the day with staff in hospital and then family at home to assist with fatigue/endurance  Stairs            Wheelchair Mobility    Modified Rankin (Stroke Patients Only)       Balance                                             Pertinent Vitals/Pain Pain Assessment: No/denies pain    Home Living Family/patient expects to be discharged to:: Private residence Living Arrangements: Parent Available Help at Discharge: Family;Available 24 hours/day Type of Home: House       Home Layout: Able to live on main level with bedroom/bathroom Home Equipment: Gilford Rile - 2 wheels;Wheelchair - manual;Cane - single point;Bedside commode Additional Comments: pt's bedroom is upstairs however was recently moved to main level, pt still prefers upstairs    Prior Function Level of Independence: Independent with assistive device(s)         Comments: family assists as needed     Hand Dominance        Extremity/Trunk Assessment               Lower Extremity Assessment: Generalized  weakness (diffuse muscle atrophy throughout)         Communication   Communication: No difficulties  Cognition Arousal/Alertness: Awake/alert Behavior During Therapy: WFL for tasks assessed/performed Overall Cognitive Status: Within Functional Limits for tasks assessed                      General Comments      Exercises        Assessment/Plan    PT Assessment Patient needs continued PT services  PT Diagnosis Difficulty walking   PT Problem List Decreased strength;Decreased activity tolerance;Decreased mobility  PT Treatment  Interventions Gait training;DME instruction;Stair training;Functional mobility training;Patient/family education;Therapeutic activities;Therapeutic exercise   PT Goals (Current goals can be found in the Care Plan section) Acute Rehab PT Goals PT Goal Formulation: With patient Time For Goal Achievement: 09/02/15 Potential to Achieve Goals: Good    Frequency Min 3X/week   Barriers to discharge        Co-evaluation               End of Session Equipment Utilized During Treatment: Gait belt Activity Tolerance: Patient tolerated treatment well Patient left: in chair;with call bell/phone within reach;with family/visitor present           Time: Andrea Best PT Time Calculation (min) (ACUTE ONLY): 20 min   Charges:   PT Evaluation $PT Eval Low Complexity: 1 Procedure     PT G Codes:        Andrea Best,Andrea Best 08/19/2015, 12:09 PM Andrea Best, PT, DPT 08/19/2015 Pager: [phone]

## 2015-08-20 DIAGNOSIS — Z7189 Other specified counseling: Secondary | ICD-10-CM

## 2015-08-20 DIAGNOSIS — Z515 Encounter for palliative care: Secondary | ICD-10-CM

## 2015-08-20 MED ORDER — CETYLPYRIDINIUM CHLORIDE 0.05 % MT LIQD
7.0000 mL | Freq: Two times a day (BID) | OROMUCOSAL | Status: DC
  Administered 2015-08-20 – 2015-08-22 (×5): 7 mL via OROMUCOSAL

## 2015-08-20 MED ORDER — MORPHINE SULFATE ER 15 MG PO TBCR
15.0000 mg | EXTENDED_RELEASE_TABLET | Freq: Two times a day (BID) | ORAL | Status: DC
  Administered 2015-08-20: 15 mg via ORAL
  Filled 2015-08-20: qty 1

## 2015-08-20 MED ORDER — ONDANSETRON HCL 4 MG/2ML IJ SOLN
4.0000 mg | Freq: Four times a day (QID) | INTRAMUSCULAR | Status: DC | PRN
Start: 2015-08-20 — End: 2015-08-22
  Administered 2015-08-21: 4 mg via INTRAVENOUS
  Filled 2015-08-20: qty 2

## 2015-08-20 MED ORDER — LEVOFLOXACIN 500 MG PO TABS
500.0000 mg | ORAL_TABLET | Freq: Every day | ORAL | Status: DC
  Administered 2015-08-20 – 2015-08-22 (×2): 500 mg via ORAL
  Filled 2015-08-20 (×3): qty 1

## 2015-08-20 MED ORDER — SODIUM CHLORIDE 0.9 % IV BOLUS (SEPSIS)
1000.0000 mL | Freq: Once | INTRAVENOUS | Status: AC
  Administered 2015-08-20: 1000 mL via INTRAVENOUS

## 2015-08-20 MED ORDER — FERROUS SULFATE 325 (65 FE) MG PO TABS
325.0000 mg | ORAL_TABLET | Freq: Three times a day (TID) | ORAL | Status: DC
  Administered 2015-08-20 – 2015-08-22 (×3): 325 mg via ORAL
  Filled 2015-08-20 (×4): qty 1

## 2015-08-20 NOTE — Progress Notes (Signed)
Nursing Note:Asleep earlier had late supper and vomited after eating.A:Paged on-call for prn for nausea.wbb

## 2015-08-20 NOTE — Consult Note (Addendum)
Consultation Note Date: 08/20/2015   Patient Name: Andrea Best  DOB: 01-17-1971  MRN: [id number]  Age / Sex: 45 y.o., female  PCP: Aretta Nip, MD Referring Physician: Charlynne Cousins, MD  Reason for Consultation: Establishing goals of care  HPI/Patient Profile: 45 y.o. female  with past medical history of Gastric cancer with gastric outlet obstruction, malnutrition, lupus, Sjogren's syndrome, chronic anemia, anti-neoplastic chemotherapy-induced anemia admitted on 08/16/2015 with neutropenic fever/sepsis, chemotherapy-induced diarrhea, pancytopenia.     Clinical Assessment and Goals of Care: I met today with Andrea Best and Andrea Best mother Andrea Best.  He reports the most important things to Andrea Best Andrea Best family as well as possible for as long as possible, spending time with family, and Andrea Best faith. Andrea Best is Jehovah's Witness and reports that this influences how Andrea Best views decisions regarding Andrea Best healthcare.  Andrea Best is currently being treated with FOLFIRI/ ramucirumab at the [facility] in [place], last treatment 08/05/2015.  Andrea Best and Andrea Best mother both report understanding that the purpose of treatments is to slow progression of Andrea Best disease and are not curative intent. They do feel like Andrea Best has been getting high level of care through [facility] and want to continue with this when Andrea Best is discharged.  We discussed clinical course over the past several months and the changes that they have noted in Andrea Best nutrition, cognition, and functional status. We discussed working to develop a plan of care moving forward that would focus on allowing Andrea Best to spend time doing things that are most important to Andrea Best (spending time with Andrea Best family and faith) while also considering limits on the type of care Andrea Best receives if it is not likely to his all and Andrea Best becoming well enough again to achieve  these goals.  Specifically discussed CODE STATUS as well as MOST form today.  Andrea Best reports today that Andrea Best elected full CODE STATUS on admission. Andrea Best mother was surprised by this decision. Andrea Best reports in prior conversations they have discussed that this would not be Andrea Best wishes.  The patient would like to consider this information this evening and we will plan to reconvene tomorrow to complete most form prior to discharge if Andrea Best thinks this would be helpful. I recommended that we complete this prior to Andrea Best leaving the hospital.   SUMMARY OF RECOMMENDATIONS   - Patient is full code at this time.  Andrea Best mother is surprised by this decision. Andrea Best and Andrea Best family have been in discussion about this and Andrea Best mother reports they want to discuss further this evening. We talked at great length about Andrea Best goals and the fact that if Andrea Best were to suffer cardiac or respiratory arrest performing aggressive measures would not be likely to result in Andrea Best being well enough to spend meaningful time with Andrea Best family or in Andrea Best faith. - He is invested in continuing to follow-up with [facility] in [place] when Andrea Best is discharged from the hospital. - I recommended patient of a most form prior to discharge  from the hospital.  Left this for family to review. It will look at it this evening and let me know if they are willing to complete prior to Andrea Best leaving (likely tomorrow). - I offered a copy of hard choices for loving people for family to review. Patient's mother reports that they have literature through their church that discusses medical care and that they will rely on this information rather than a copy of the book.  Code Status/Advance Care Planning:  Full code  Palliative Prophylaxis:   Aspiration, Bowel Regimen, Delirium Protocol and Frequent Pain Assessment  Additional Recommendations (Limitations, Scope, Preferences):  Full Scope Treatment  Symptom Management: - Pain: Andrea Best reports being loopy  when Andrea Best was given oxycodone. Plan to continue with current regimen which is Andrea Best home regimen of MS Contin with breakthrough morphine concentrate as needed  Psycho-social/Spiritual:   Desire for further Chaplaincy support:no  Additional Recommendations: Caregiving  Support/Resources  Prognosis:   Unable to determine, however, Andrea Best has had significant decline in Andrea Best functional status and has advanced cancer. I would suspect Andrea Best prognosis to be less than 6 months and Andrea Best should qualify for hospice support if so desired at any point in the future.  Discharge Planning: Home with Home Health      Primary Diagnoses: Present on Admission:  . Gastric cancer ([facility]) . Malnutrition of moderate degree . Antineoplastic chemotherapy induced anemia . Neutropenic fever ([facility]) . Sepsis ([facility]) . Diarrhea . Dehydration . Occult blood in stools . Pancytopenia ([place] [facility]) . Fever and neutropenia ([facility])  I have reviewed the medical record, interviewed the patient and family, and examined the patient. The following aspects are pertinent.  Past Medical History  Diagnosis Date  . Eczema   . Lupus (HCC)     joint pain and extreme fatique - improved after plaquenil and prednisone therapy  . Sjogren's disease ([facility])     caused pt to loose Andrea Best teeth due to dry mouth;  prone to eye irritation and infection due to dry eye; affects immune system - PRONE TO INFECTIONS   . Allergy   . Tachycardia   . Protein-calorie malnutrition, severe ([facility])   . Abnormal findings on esophagogastroduodenoscopy (EGD) 09/22/13    esophagitis  . Asthma     no recent flare ups - no inhalers - as of 11/19/13  . GERD (gastroesophageal reflux disease)   . Anemia     iron deficiency   . Pneumonia     health care -associated July 2015 post op gastrectomy surgery  . Gastric cancer ([facility]) 09/22/13 &10/10/13    adenocarcinoma/metastatic  FIRST CHEMO WAS 11/14/13 - NEXT CHEMO TREATMENT IS 11/21/13.   Social History   Social History  . Marital  Status: Single    Spouse Name: N/A  . Number of Children: 1  . Years of Education: College   Occupational History  .     Social History Main Topics  . Smoking status: Never Smoker   . Smokeless tobacco: Never Used  . Alcohol Use: No     Comment: very occasional- none since feb 2015 when pt got sick  . Drug Use: No  . Sexual Activity: Not Asked   Other Topics Concern  . None   Social History Narrative   Single. Lives with daughter (college age)  Andrea Best, and patient's mother.    Work at [employer] as a Research scientist (physical sciences).   Owns Andrea Best own car. Always wears seatbelt.    Family History  Problem Relation Age of  Onset  . Hypertension Mother   . Diabetes Mother   . Diabetes Maternal Grandmother   . Diabetes Maternal Grandfather   . HIV/AIDS Maternal Uncle   . Stomach cancer Other     MGMs sister   Scheduled Meds: . sodium chloride   Intravenous Once  . antiseptic oral rinse  7 mL Mouth Rinse BID  . DULoxetine  30 mg Oral Daily  . feeding supplement  1 Container Oral BID BM  . feeding supplement (ENSURE ENLIVE)  237 mL Oral Q24H  . ferrous sulfate  325 mg Oral TID WC  . levofloxacin  500 mg Oral Daily  . morphine  15 mg Oral Q12H  . potassium chloride SA  20 mEq Oral Daily  . saccharomyces boulardii  250 mg Oral BID  . sodium chloride flush  10-40 mL Intracatheter Q12H   Continuous Infusions:  PRN Meds:.diphenhydrAMINE, loperamide, morphine CONCENTRATE, sodium chloride flush Medications Prior to Admission:  Prior to Admission medications   Medication Sig Start Date End Date Taking? Authorizing Provider  Artificial Tear Ointment (DRY EYES OP) Apply 2 drops to eye daily as needed (dry eyes).   Yes Historical Provider, MD  bumetanide (BUMEX) 1 MG tablet Take 1 mg by mouth daily as needed. Fluid, swelling 06/10/15  Yes Historical Provider, MD  Cholecalciferol (VITAMIN D-3 PO) Take 5 mLs by mouth daily.   Yes Historical Provider, MD  diphenoxylate-atropine (LOMOTIL)  2.5-0.025 MG tablet Take 1 tablet by mouth every 4 (four) hours as needed. diarrhea 08/13/15  Yes Historical Provider, MD  dronabinol (MARINOL) 5 MG capsule Take 5 mg by mouth every 12 (twelve) hours as needed. n/v 06/29/15  Yes Historical Provider, MD  DULoxetine (CYMBALTA) 30 MG capsule Take 30 mg by mouth daily. 07/08/15  Yes Historical Provider, MD  ferrous sulfate 325 (65 FE) MG tablet Take 325 mg by mouth 3 (three) times a week.    Yes Historical Provider, MD  hydroxychloroquine (PLAQUENIL) 200 MG tablet Take 1 tablet (200 mg total) by mouth 2 (two) times daily. 02/12/15  Yes Ladell Pier, MD  morphine (MS CONTIN) 30 MG 12 hr tablet Take 1 tablet (30 mg total) by mouth every 12 (twelve) hours. Patient taking differently: Take 30 mg by mouth every 12 (twelve) hours as needed for pain.  05/19/15  Yes Owens Shark, NP  morphine (ROXANOL) 20 MG/ML concentrated solution Take 1/2 ml to 39m every 4 hours as need for pain 04/21/15  Yes LOwens Shark NP  ondansetron (ZOFRAN) 4 MG tablet Take 1 tablet (4 mg total) by mouth every 8 (eight) hours as needed for nausea or vomiting. 12/02/14  Yes LOwens Shark NP  OVER THE COUNTER MEDICATION Take 15 mLs by mouth daily. [place]- Carnitine- energy pill   Yes Historical Provider, MD  potassium chloride SA (K-DUR,KLOR-CON) 20 MEQ tablet Take 1 tablet (20 mEq total) by mouth daily. 03/07/15  Yes COrpah Greek MD  amoxicillin-clavulanate (AUGMENTIN) 875-125 MG tablet Take 1 tablet by mouth 2 (two) times daily. For 30days Patient not taking: Reported on 08/16/2015 03/04/15   CTruman Hayward MD  dexamethasone (DECADRON) 2 MG tablet Take 5 tablets (10 mg) PO at 10PM the night before chemo and 5 tablets at 6AM the morning of first chemo. Patient not taking: Reported on 08/16/2015 05/19/15   GLadell Pier MD   Allergies  Allergen Reactions  . Compazine [Prochlorperazine Maleate] Other (See Comments)    Dystonia  . Azithromycin Rash  .  Sulfa Antibiotics Rash      hallucinations  . Hydrocodone-Acetaminophen Nausea And Vomiting    Hallucinating   . 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 Andrea Best DOES NOT WANT ANY BLOOD transfusions but will accept Albumin,    Review of Systems   Constitutional: Positive for activity change, fatigue and unexpected weight change.  Musculoskeletal: Positive for back pain.  Neurological: Positive for weakness.  Psychiatric/Behavioral: Positive for confusion and sleep disturbance.    Physical Exam  Constitutional: No distress.  Cachectic, ill-appearing female sitting in bed  HENT:  Mouth/Throat: Oropharynx is clear and moist.  Temporal wasting  Eyes: Conjunctivae are normal.  Neck: Neck supple. No JVD present.  Cardiovascular: Normal rate and regular rhythm.   No murmur heard. Pulmonary/Chest: Breath sounds normal. No respiratory distress.  Abdominal: Soft. Andrea Best exhibits no distension. There is no tenderness.  Musculoskeletal: Andrea Best exhibits no edema.  Neurological: Andrea Best is alert. No cranial nerve deficit.  Skin: Skin is warm and dry. Andrea Best is not diaphoretic.  Psychiatric: Andrea Best has a normal mood and affect.    Vital Signs: BP 77/31 mmHg  Pulse 108  Temp(Src) 97.6 F (36.4 C) (Oral)  Resp 16  Ht 5' 5"  (1.651 m)  Wt 54.2 kg (119 lb 7.8 oz)  BMI 19.88 kg/m2  SpO2 100% Pain Assessment: No/denies pain   Pain Score: Asleep   SpO2: SpO2: 100 % O2 Device:SpO2: 100 % O2 Flow Rate: .   IO: Intake/output summary:  Intake/Output Summary (Last 24 hours) at 08/20/15 1114 Last data filed at 08/20/15 0900  Gross per 24 hour  Intake    240 ml  Output      0 ml  Net    240 ml    LBM: Last BM Date: 08/19/15 Baseline Weight: Weight: 51.71 kg (114 lb) Most recent weight: Weight: 54.2 kg (119 lb 7.8 oz)     Palliative Assessment/Data:   Flowsheet Rows        Most Recent Value   Intake Tab    Referral Department  Hospitalist   Unit at Time of Referral  Oncology Unit   Palliative Care Primary Diagnosis  Cancer    Date Notified  08/20/15   Palliative Care Type  New Palliative care   Reason for referral  Clarify Goals of Care   Date of Admission  08/16/15   Date first seen by Palliative Care  08/20/15   # of days Palliative referral response time  0 Day(s)   # of days IP prior to Palliative referral  4   Clinical Assessment    Palliative Performance Scale Score  60%   Pain Max last 24 hours  4   Pain Min Last 24 hours  0   Psychosocial & Spiritual Assessment    Palliative Care Outcomes    Patient/Family meeting held?  Yes   Who was at the meeting?  Patient and Andrea Best mother   Palliative Care Outcomes  Clarified goals of care      Time In: 0850 Time Out: 1005 Time Total: 75 Greater than 50%  of this time was spent counseling and coordinating care related to the above assessment and plan.  Signed by: Micheline Rough, MD   Please contact Palliative Medicine Team phone at [phone] for questions and concerns.  For individual provider: See Shea Evans

## 2015-08-20 NOTE — Progress Notes (Signed)
Nursing Note: BP 70/30 the patient arouses easily and is assymptomatc.Paged on-call and made aware.Andrea Best returned call and ordered 1000 cc bolus of Normal Saline and re-check BP and call result.wbb

## 2015-08-20 NOTE — Progress Notes (Signed)
[facility] PROGRESS NOTE    Progress Note  Andrea Best  [id number] DOB: June 12, 1970 DOA: 08/16/2015 PCP: Aretta Nip, MD     Brief Narrative:   Andrea Best is an 45 y.o. 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 The came into the hospital for 1 week of diarrhea and fevers, came into the hospital via EMS was started on the sepsis protocol with antibiotics and Neupogen.  Assessment/Plan:   Sepsis (HCC)/Neutropenic fever:  As discussing with her blood pressure always runs low. Leucopenia resolved, Change to Levaquin orally.  Chemotherapy-induced diarrhea: C. difficile PCR negative resolving. Cont Florastorand Imodium.  Metastatic gastric carcinoma: Currently under chemotherapy followed by Dr. Malachy Mood Overall prognosis is poor. She continues to be a full code and wishes aggressive treatment. Family will like to meet with PMT.  Iron deficiency anemia/  Antineoplastic chemotherapy induced anemia: Today her hemoglobin is below 7, she was given IV Aranesp and IV iron Cont monitor Hbg.  Severe protein caloric malnutrition: continue supplementation as tolerated.  Chronic back pain. Continue narcotics.  Pancytopenia: Secondary to chemotherapy, she has received Neupogen and Aranesp.  Improved.      DVT prophylaxis: SCD's Family Communication:none Disposition Plan/Barrier to D/C: home in am Code Status:  Code Status History    Date Active Date Inactive Code Status Order ID Comments User Context   01/20/2015  6:16 PM 02/05/2015  2:55 PM Full Code [id number]  Hosie Poisson, MD Inpatient   01/06/2015  2:43 PM 01/11/2015  7:17 PM Full Code [id number]  Erick Colace, NP ED   06/24/2014  1:29 PM 06/25/2014  3:43 AM Full Code [id number]  Greggory Keen, MD HOV   10/21/2013 11:16 AM 10/27/2013   8:14 PM Full Code [id number]  Marybelle Killings, MD Inpatient   10/08/2013 11:25 PM 10/21/2013 11:16 AM Full Code [id number]  Rise Patience, MD Inpatient    Advance Directive Documentation        Most Recent Value   Type of Advance Directive  Healthcare Power of Attorney   Pre-existing out of facility DNR order (yellow form or pink MOST form)     "MOST" Form in Place?          IV Access:    Peripheral IV   Procedures and diagnostic studies:   No results found.   Medical Consultants:    None.  Anti-Infectives:   Vancomycin and cefepime started on 08/16/2015.  Subjective:    Andrea Best relates that her diarrhea has improved some.   Objective:    Filed Vitals:   08/19/15 1504 08/19/15 2224 08/20/15 0600 08/20/15 0651  BP: 97/52 92/51 70/30  77/31  Pulse: 118 110 108 108  Temp: 97.9 F (36.6 C) 98.1 F (36.7 C)  97.6 F (36.4 C)  TempSrc: Oral Oral  Oral  Resp:      Height:      Weight:      SpO2: 100% 96%  100%    Intake/Output Summary (Last 24 hours) at 08/20/15 0803 Last data filed at 08/19/15 1020  Gross per 24 hour  Intake    120 ml  Output      0 ml  Net    120 ml   Filed Weights   08/16/15 1719 08/16/15 2235 08/18/15 1659  Weight: 51.71 kg (114 lb) 53 kg (116 lb 13.5 oz) 54.2 kg (119 lb 7.8  oz)    Exam: General exam: In no acute distress.Cachectic appearing. Respiratory system: Good air movement and clear to auscultation. Cardiovascular system: S1 & S2 heard, RRR. No JVD, murmurs, rubs, appreciated JVD Gastrointestinal system: Abdomen is nondistended, soft and nontender.  Central nervous system: Alert and oriented. No focal neurological deficits. Extremities: No pedal edema. Skin: No rashes, lesions or ulcers    Data Reviewed:    Labs: Basic Metabolic Panel:  Recent Labs Lab 08/16/15 1615 08/17/15 0308 08/18/15 0435  NA 134* 135 135  K 3.3* 3.2* 3.5  CL 104 107 110  CO2 24 22 21*  GLUCOSE 111* 106* 97  BUN 9 7 6     CREATININE 0.80 0.67 0.54  CALCIUM 7.3* 7.1* 7.4*  MG 1.7  --   --   PHOS 2.3*  --   --    GFR Estimated Creatinine Clearance: 78.4 mL/min (by C-G formula based on Cr of 0.54). Liver Function Tests:  Recent Labs Lab 08/16/15 1615 08/18/15 0435  AST 16 11*  ALT 9* 7*  ALKPHOS 68 53  BILITOT 0.7 0.6  PROT 5.6* 4.3*  ALBUMIN 1.8* 1.4*   No results for input(s): LIPASE, AMYLASE in the last 168 hours. No results for input(s): AMMONIA in the last 168 hours. Coagulation profile  Recent Labs Lab 08/16/15 2259  INR 1.31    CBC:  Recent Labs Lab 08/16/15 1615 08/17/15 0308 08/18/15 0435 08/19/15 0420  WBC 0.5* 0.4* 1.0* 4.5  NEUTROABS 0.0*  --  0.1* 2.9  HGB 6.4* 7.0* 6.2* 7.2*  HCT 19.4* 20.9* 18.9* 22.6*  MCV 84.7 82.9 83.3 86.9  PLT 70* 73* 88* 189   Cardiac Enzymes: No results for input(s): CKTOTAL, CKMB, CKMBINDEX, TROPONINI in the last 168 hours. BNP (last 3 results) No results for input(s): PROBNP in the last 8760 hours. CBG: No results for input(s): GLUCAP in the last 168 hours. D-Dimer: No results for input(s): DDIMER in the last 72 hours. Hgb A1c: No results for input(s): HGBA1C in the last 72 hours. Lipid Profile: No results for input(s): CHOL, HDL, LDLCALC, TRIG, CHOLHDL, LDLDIRECT in the last 72 hours. Thyroid function studies: No results for input(s): TSH, T4TOTAL, T3FREE, THYROIDAB in the last 72 hours.  Invalid input(s): FREET3 Anemia work up: No results for input(s): VITAMINB12, FOLATE, FERRITIN, TIBC, IRON, RETICCTPCT in the last 72 hours. Sepsis Labs:  Recent Labs Lab 08/16/15 1615 08/16/15 1834 08/16/15 2259 08/17/15 0308 08/18/15 0435 08/19/15 0420  PROCALCITON  --   --  2.79  --   --   --   WBC 0.5*  --   --  0.4* 1.0* 4.5  LATICACIDVEN  --  2.00 2.4* 1.6  --   --    Microbiology Recent Results (from the past 240 hour(s))  Culture, blood (Routine X 2)     Status: None (Preliminary result)   Collection Time: 08/16/15  4:13  PM  Result Value Ref Range Status   Specimen Description BLOOD LEFT FOREARM  Final   Special Requests BOTTLES DRAWN AEROBIC AND ANAEROBIC 5CC  Final   Culture   Final    NO GROWTH 3 DAYS Performed at [facility]    Report Status PENDING  Incomplete  Culture, blood (Routine X 2)     Status: None (Preliminary result)   Collection Time: 08/16/15  4:14 PM  Result Value Ref Range Status   Specimen Description BLOOD RIGHT ANTECUBITAL  Final   Special Requests BOTTLES DRAWN AEROBIC AND ANAEROBIC 5CC  Final   Culture   Final    NO GROWTH 3 DAYS Performed at [facility]    Report Status PENDING  Incomplete  C difficile quick scan w PCR reflex     Status: None   Collection Time: 08/16/15  4:53 PM  Result Value Ref Range Status   C Diff antigen NEGATIVE NEGATIVE Final   C Diff toxin NEGATIVE NEGATIVE Final   C Diff interpretation Negative for toxigenic C. difficile  Final  MRSA PCR Screening     Status: None   Collection Time: 08/17/15 12:27 AM  Result Value Ref Range Status   MRSA by PCR NEGATIVE NEGATIVE Final    Comment:        The GeneXpert MRSA Assay (FDA approved for NASAL specimens only), is one component of a comprehensive MRSA colonization surveillance program. It is not intended to diagnose MRSA infection nor to guide or monitor treatment for MRSA infections.   Culture, body fluid-bottle     Status: None (Preliminary result)   Collection Time: 08/17/15  4:01 PM  Result Value Ref Range Status   Specimen Description FLUID ABDOMEN PERITONEAL  Final   Special Requests BOTTLES DRAWN AEROBIC AND ANAEROBIC 10CC  Final   Culture   Final    NO GROWTH 2 DAYS Performed at [facility]    Report Status PENDING  Incomplete  Gram stain     Status: None   Collection Time: 08/17/15  4:01 PM  Result Value Ref Range Status   Specimen Description FLUID ABDOMEN PERITONEAL  Final   Special Requests NONE  Final   Gram Stain   Final    FEW WBC PRESENT,  PREDOMINANTLY MONONUCLEAR NO ORGANISMS SEEN Performed at [facility]    Report Status 08/17/2015 FINAL  Final  Urine culture     Status: Abnormal   Collection Time: 08/18/15  9:21 AM  Result Value Ref Range Status   Specimen Description URINE, RANDOM  Final   Special Requests NONE  Final   Culture (A)  Final    <10,000 COLONIES/mL INSIGNIFICANT GROWTH Performed at [facility]    Report Status 08/19/2015 FINAL  Final     Medications:   . sodium chloride   Intravenous Once  . antiseptic oral rinse  7 mL Mouth Rinse BID  . ceFEPime (MAXIPIME) IV  2 g Intravenous Q8H  . DULoxetine  30 mg Oral Daily  . feeding supplement  1 Container Oral BID BM  . feeding supplement (ENSURE ENLIVE)  237 mL Oral Q24H  . morphine  30 mg Oral Q12H  . potassium chloride SA  20 mEq Oral Daily  . saccharomyces boulardii  250 mg Oral BID  . sodium chloride flush  10-40 mL Intracatheter Q12H  . vancomycin  750 mg Intravenous Q12H   Continuous Infusions: . sodium chloride 1,000 mL (08/20/15 0407)    Time spent: 15 min   LOS: 4 days   Charlynne Cousins  [facility] Pager [phone]  *Please refer to [email or web address], password [email or web address] to get updated schedule on who will round on this patient, as hospitalists switch teams weekly. If 7PM-7AM, please contact night-coverage at [email or web address], password [email or web address] for any overnight needs.  08/20/2015, 8:03 AM

## 2015-08-21 DIAGNOSIS — G893 Neoplasm related pain (acute) (chronic): Secondary | ICD-10-CM | POA: Insufficient documentation

## 2015-08-21 DIAGNOSIS — Z515 Encounter for palliative care: Secondary | ICD-10-CM | POA: Insufficient documentation

## 2015-08-21 DIAGNOSIS — Z7189 Other specified counseling: Secondary | ICD-10-CM | POA: Insufficient documentation

## 2015-08-21 DIAGNOSIS — G9340 Encephalopathy, unspecified: Secondary | ICD-10-CM

## 2015-08-21 LAB — COMPREHENSIVE METABOLIC PANEL
ALK PHOS: 76 U/L (ref 38–126)
ALT: 9 U/L — AB (ref 14–54)
AST: 13 U/L — AB (ref 15–41)
Albumin: 1.3 g/dL — ABNORMAL LOW (ref 3.5–5.0)
Anion gap: 4 — ABNORMAL LOW (ref 5–15)
BUN: 7 mg/dL (ref 6–20)
CALCIUM: 8.2 mg/dL — AB (ref 8.9–10.3)
CO2: 18 mmol/L — AB (ref 22–32)
CREATININE: 0.87 mg/dL (ref 0.44–1.00)
Chloride: 113 mmol/L — ABNORMAL HIGH (ref 101–111)
Glucose, Bld: 91 mg/dL (ref 65–99)
Potassium: 2.4 mmol/L — CL (ref 3.5–5.1)
Sodium: 135 mmol/L (ref 135–145)
Total Bilirubin: 1 mg/dL (ref 0.3–1.2)
Total Protein: 4.1 g/dL — ABNORMAL LOW (ref 6.5–8.1)

## 2015-08-21 LAB — CBC
HCT: 21.9 % — ABNORMAL LOW (ref 36.0–46.0)
HEMOGLOBIN: 7.2 g/dL — AB (ref 12.0–15.0)
MCH: 28.1 pg (ref 26.0–34.0)
MCHC: 32.9 g/dL (ref 30.0–36.0)
MCV: 85.5 fL (ref 78.0–100.0)
Platelets: 149 10*3/uL — ABNORMAL LOW (ref 150–400)
RBC: 2.56 MIL/uL — AB (ref 3.87–5.11)
RDW: 23.9 % — ABNORMAL HIGH (ref 11.5–15.5)
WBC: 13.1 10*3/uL — AB (ref 4.0–10.5)

## 2015-08-21 LAB — CULTURE, BLOOD (ROUTINE X 2)
CULTURE: NO GROWTH
Culture: NO GROWTH

## 2015-08-21 MED ORDER — POTASSIUM CHLORIDE IN NACL 40-0.9 MEQ/L-% IV SOLN
INTRAVENOUS | Status: DC
  Administered 2015-08-21 – 2015-08-22 (×2): 75 mL/h via INTRAVENOUS
  Filled 2015-08-21 (×3): qty 1000

## 2015-08-21 MED ORDER — SODIUM CHLORIDE 0.9 % IV SOLN: INTRAVENOUS | Status: DC

## 2015-08-21 MED ORDER — MORPHINE SULFATE (CONCENTRATE) 10 MG/0.5ML PO SOLN
10.0000 mg | ORAL | Status: DC | PRN
Start: 2015-08-21 — End: 2015-08-22

## 2015-08-21 NOTE — Progress Notes (Signed)
[facility] PROGRESS NOTE    Progress Note  Andrea Best  NHA:[id number] DOB: [birth year] DOA: 08/16/2015 PCP: Aretta Nip, MD     Brief Narrative:   Andrea Best is an 45 y.o. 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 The came into the hospital for 1 week of diarrhea and fevers, came into the hospital via EMS was started on the sepsis protocol with antibiotics and Neupogen.  Assessment/Plan:   Sepsis (HCC)/Neutropenic fever: I tried to check the blood pressure with a blood pressure cuff in the room and is to bake for her. Leucopenia resolved, Change to Levaquin orally. She has remained afebrile.  Chemotherapy-induced diarrhea: C. difficile PCR negative resolving. Cont Florastorand Imodium.  Metastatic gastric carcinoma: Currently under chemotherapy followed by Dr. Malachy Mood Overall prognosis is poor. She continues to be a full code and wishes aggressive treatment. Family met with palliative care she would like to continue aggressive treatment.  Iron deficiency anemia/  Antineoplastic chemotherapy induced anemia: She received IV iron and Aranesp. Cont monitor Hbg.  Severe protein caloric malnutrition: continue supplementation as tolerated.  Chronic back pain. Discontinue narcotics the patient is overly sedated.  Pancytopenia: Secondary to chemotherapy, she has received Neupogen and Aranesp.  Improved.    Acute encephalopathy: Likely due to narcotics. The patient's mother relates that she has only been taking narcotics at home as needed. DC all narcotics. Place a pulse ox at bedside. Check a CBC and a complete metabolic panel.  DVT prophylaxis: SCD's Family Communication:none Disposition Plan/Barrier to D/C: home in 1-2 days Code Status:  Code Status History    Date Active Date  Inactive Code Status Order ID Comments User Context   01/20/2015  6:16 PM 02/05/2015  2:55 PM Full Code [id number]  Hosie Poisson, MD Inpatient   01/06/2015  2:43 PM 01/11/2015  7:17 PM Full Code [id number]  Erick Colace, NP ED   06/24/2014  1:29 PM 06/25/2014  3:43 AM Full Code [id number]  Greggory Keen, MD HOV   10/21/2013 11:16 AM 10/27/2013  8:14 PM Full Code [id number]  Marybelle Killings, MD Inpatient   10/08/2013 11:25 PM 10/21/2013 11:16 AM Full Code [id number]  Rise Patience, MD Inpatient    Advance Directive Documentation        Most Recent Value   Type of Advance Directive  Healthcare Power of Attorney   Pre-existing out of facility DNR order (yellow form or pink MOST form)     "MOST" Form in Place?          IV Access:    Peripheral IV   Procedures and diagnostic studies:   No results found.   Medical Consultants:    None.  Anti-Infectives:   Vancomycin and cefepime started on 08/16/2015.  Subjective:    Andrea Best relates that her diarrhea has improved some.   Objective:    Filed Vitals:   08/20/15 0651 08/20/15 1517 08/20/15 2123 08/21/15 0515  BP: 77/31 86/54 91/46  88/45  Pulse: 108 125 120 122  Temp: 97.6 F (36.4 C) 97.8 F (36.6 C) 97.8 F (36.6 C) 97.6 F (36.4 C)  TempSrc: Oral Oral Oral Axillary  Resp:   16 14  Height:      Weight:      SpO2: 100% 100% 100% 100%    Intake/Output Summary (Last 24 hours) at 08/21/15 0825 Last data filed at 08/21/15  [id number]  Gross per 24 hour  Intake    620 ml  Output    275 ml  Net    345 ml   Filed Weights   08/16/15 1719 08/16/15 2235 08/18/15 1659  Weight: 51.71 kg (114 lb) 53 kg (116 lb 13.5 oz) 54.2 kg (119 lb 7.8 oz)    Exam: General exam:Sleepy.Cachectic appearing. Respiratory system: Good air movement and clear to auscultation. Cardiovascular system: S1 & S2 heard, RRR.  Gastrointestinal system: Abdomen is nondistended, soft and nontender.  Central nervous system: Alert and oriented.  No focal neurological deficits. Extremities: Streaming thin extremities.    Data Reviewed:    Labs: Basic Metabolic Panel:  Recent Labs Lab 08/16/15 1615 08/17/15 0308 08/18/15 0435  NA 134* 135 135  K 3.3* 3.2* 3.5  CL 104 107 110  CO2 24 22 21*  GLUCOSE 111* 106* 97  BUN 9 7 6   CREATININE 0.80 0.67 0.54  CALCIUM 7.3* 7.1* 7.4*  MG 1.7  --   --   PHOS 2.3*  --   --    GFR Estimated Creatinine Clearance: 78.4 mL/min (by C-G formula based on Cr of 0.54). Liver Function Tests:  Recent Labs Lab 08/16/15 1615 08/18/15 0435  AST 16 11*  ALT 9* 7*  ALKPHOS 68 53  BILITOT 0.7 0.6  PROT 5.6* 4.3*  ALBUMIN 1.8* 1.4*   No results for input(s): LIPASE, AMYLASE in the last 168 hours. No results for input(s): AMMONIA in the last 168 hours. Coagulation profile  Recent Labs Lab 08/16/15 2259  INR 1.31    CBC:  Recent Labs Lab 08/16/15 1615 08/17/15 0308 08/18/15 0435 08/19/15 0420  WBC 0.5* 0.4* 1.0* 4.5  NEUTROABS 0.0*  --  0.1* 2.9  HGB 6.4* 7.0* 6.2* 7.2*  HCT 19.4* 20.9* 18.9* 22.6*  MCV 84.7 82.9 83.3 86.9  PLT 70* 73* 88* 189   Cardiac Enzymes: No results for input(s): CKTOTAL, CKMB, CKMBINDEX, TROPONINI in the last 168 hours. BNP (last 3 results) No results for input(s): PROBNP in the last 8760 hours. CBG: No results for input(s): GLUCAP in the last 168 hours. D-Dimer: No results for input(s): DDIMER in the last 72 hours. Hgb A1c: No results for input(s): HGBA1C in the last 72 hours. Lipid Profile: No results for input(s): CHOL, HDL, LDLCALC, TRIG, CHOLHDL, LDLDIRECT in the last 72 hours. Thyroid function studies: No results for input(s): TSH, T4TOTAL, T3FREE, THYROIDAB in the last 72 hours.  Invalid input(s): FREET3 Anemia work up: No results for input(s): VITAMINB12, FOLATE, FERRITIN, TIBC, IRON, RETICCTPCT in the last 72 hours. Sepsis Labs:  Recent Labs Lab 08/16/15 1615 08/16/15 1834 08/16/15 2259 08/17/15 0308 08/18/15 0435  08/19/15 0420  PROCALCITON  --   --  2.79  --   --   --   WBC 0.5*  --   --  0.4* 1.0* 4.5  LATICACIDVEN  --  2.00 2.4* 1.6  --   --    Microbiology Recent Results (from the past 240 hour(s))  Culture, blood (Routine X 2)     Status: None (Preliminary result)   Collection Time: 08/16/15  4:13 PM  Result Value Ref Range Status   Specimen Description BLOOD LEFT FOREARM  Final   Special Requests BOTTLES DRAWN AEROBIC AND ANAEROBIC 5CC  Final   Culture   Final    NO GROWTH 4 DAYS Performed at [facility]    Report Status PENDING  Incomplete  Culture, blood (Routine X 2)  Status: None (Preliminary result)   Collection Time: 08/16/15  4:14 PM  Result Value Ref Range Status   Specimen Description BLOOD RIGHT ANTECUBITAL  Final   Special Requests BOTTLES DRAWN AEROBIC AND ANAEROBIC 5CC  Final   Culture   Final    NO GROWTH 4 DAYS Performed at [facility]    Report Status PENDING  Incomplete  C difficile quick scan w PCR reflex     Status: None   Collection Time: 08/16/15  4:53 PM  Result Value Ref Range Status   C Diff antigen NEGATIVE NEGATIVE Final   C Diff toxin NEGATIVE NEGATIVE Final   C Diff interpretation Negative for toxigenic C. difficile  Final  MRSA PCR Screening     Status: None   Collection Time: 08/17/15 12:27 AM  Result Value Ref Range Status   MRSA by PCR NEGATIVE NEGATIVE Final    Comment:        The GeneXpert MRSA Assay (FDA approved for NASAL specimens only), is one component of a comprehensive MRSA colonization surveillance program. It is not intended to diagnose MRSA infection nor to guide or monitor treatment for MRSA infections.   Culture, body fluid-bottle     Status: None (Preliminary result)   Collection Time: 08/17/15  4:01 PM  Result Value Ref Range Status   Specimen Description FLUID ABDOMEN PERITONEAL  Final   Special Requests BOTTLES DRAWN AEROBIC AND ANAEROBIC 10CC  Final   Culture   Final    NO GROWTH 3 DAYS Performed  at [facility]    Report Status PENDING  Incomplete  Gram stain     Status: None   Collection Time: 08/17/15  4:01 PM  Result Value Ref Range Status   Specimen Description FLUID ABDOMEN PERITONEAL  Final   Special Requests NONE  Final   Gram Stain   Final    FEW WBC PRESENT, PREDOMINANTLY MONONUCLEAR NO ORGANISMS SEEN Performed at [facility]    Report Status 08/17/2015 FINAL  Final  Urine culture     Status: Abnormal   Collection Time: 08/18/15  9:21 AM  Result Value Ref Range Status   Specimen Description URINE, RANDOM  Final   Special Requests NONE  Final   Culture (A)  Final    <10,000 COLONIES/mL INSIGNIFICANT GROWTH Performed at [facility]    Report Status 08/19/2015 FINAL  Final     Medications:   . sodium chloride   Intravenous Once  . antiseptic oral rinse  7 mL Mouth Rinse BID  . DULoxetine  30 mg Oral Daily  . feeding supplement  1 Container Oral BID BM  . feeding supplement (ENSURE ENLIVE)  237 mL Oral Q24H  . ferrous sulfate  325 mg Oral TID WC  . levofloxacin  500 mg Oral Daily  . potassium chloride SA  20 mEq Oral Daily  . saccharomyces boulardii  250 mg Oral BID  . sodium chloride flush  10-40 mL Intracatheter Q12H   Continuous Infusions:    Time spent: 25 min   LOS: 5 days   Charlynne Cousins  [facility] Pager [phone]  *Please refer to [email or web address], password [email or web address] to get updated schedule on who will round on this patient, as hospitalists switch teams weekly. If 7PM-7AM, please contact night-coverage at [email or web address], password [email or web address] for any overnight needs.  08/21/2015, 8:25 AM

## 2015-08-21 NOTE — Progress Notes (Signed)
Daily Progress Note   Patient Name: Andrea Best       Date: 08/21/2015 DOB: 07-24-1970  Age: 45 y.o. MRN#: [id number] Attending Physician: Charlynne Cousins, MD Primary Care Physician: Aretta Nip, MD Admit Date: 08/16/2015  Reason for Consultation/Follow-up: Establishing goals of care   Subjective: Met today with patient and her mother. The patient is sleepy today and does not really participate in conversation. Her mother and I had a long talk regarding how she has been using her medications, particularly her pain medications, at home. It appears that she has not been using medications as prescribed and has been taking MS Contin on as-needed basis rather than scheduled medication.  Her primary attending has already discontinued long-acting narcotics this morning.  Her mother and I also continued our conversation regarding long-term goals of care. She reports that she and her daughter have discussed this at length in the past and her understanding has always been that she would want to continue with interventions that are likely to add time and quality to her life while also having a natural death when the time comes. She has been looking over MOST form that I left with her yesterday. She does think it will be beneficial to complete this prior to discharge. However, she would like to follow-up tomorrow as her daughter is sleepy today and not really able to participate in the conversation.  Length of Stay: 5  Current Medications: Scheduled Meds:  . sodium chloride   Intravenous Once  . antiseptic oral rinse  7 mL Mouth Rinse BID  . DULoxetine  30 mg Oral Daily  . feeding supplement  1 Container Oral BID BM  . feeding supplement (ENSURE ENLIVE)  237 mL Oral Q24H  . ferrous  sulfate  325 mg Oral TID WC  . levofloxacin  500 mg Oral Daily  . potassium chloride SA  20 mEq Oral Daily  . saccharomyces boulardii  250 mg Oral BID  . sodium chloride flush  10-40 mL Intracatheter Q12H    Continuous Infusions:    PRN Meds: diphenhydrAMINE, morphine CONCENTRATE, ondansetron (ZOFRAN) IV, sodium chloride flush  Physical Exam        General: Cachectic, ill-appearing female lying in bed. Sleepy but will open eyes to verbal or tactile stimulation. Does not really participate in conversation  and sleeps throughout most of encounter. HEENT: No bruits, no goiter, no JVD Heart: Regular rate and rhythm. No murmur appreciated. Lungs: Good air movement, clear Abdomen: Soft, nontender, nondistended, positive bowel sounds.  Ext: No significant edema Skin: Warm and dry  Vital Signs: BP 88/45 mmHg  Pulse 122  Temp(Src) 97.6 F (36.4 C) (Axillary)  Resp 14  Ht _0  (1.651 m)  Wt 54.2 kg (119 lb 7.8 oz)  BMI 19.88 kg/m2  SpO2 100% SpO2: SpO2: 100 % O2 Device: O2 Device: Not Delivered O2 Flow Rate:    Intake/output summary:  Intake/Output Summary (Last 24 hours) at 08/21/15 1043 Last data filed at 08/21/15 5027  Gross per 24 hour  Intake    380 ml  Output    275 ml  Net    105 ml   LBM: Last BM Date: 08/19/15 Baseline Weight: Weight: 51.71 kg (114 lb) Most recent weight: Weight: 54.2 kg (119 lb 7.8 oz)       Palliative Assessment/Data:    Flowsheet Rows        Most Recent Value   Intake Tab    Referral Department  Hospitalist   Unit at Time of Referral  Oncology Unit   Palliative Care Primary Diagnosis  Cancer   Date Notified  08/20/15   Palliative Care Type  New Palliative care   Reason for referral  Clarify Goals of Care   Date of Admission  08/16/15   Date first seen by Palliative Care  08/20/15   # of days Palliative referral response time  0 Day(s)   # of days IP prior to Palliative referral  4   Clinical Assessment    Palliative Performance  Scale Score  60%   Pain Max last 24 hours  4   Pain Min Last 24 hours  0   Psychosocial & Spiritual Assessment    Palliative Care Outcomes    Patient/Family meeting held?  Yes   Who was at the meeting?  Patient and her mother   Palliative Care Outcomes  Clarified goals of care      Patient Active Problem List   Diagnosis Date Noted  . Acute encephalopathy 08/21/2015  . Abdominal distention   . Neutropenic fever ([place]) 08/16/2015  . Sepsis ([place]) 08/16/2015  . Occult blood in stools 08/16/2015  . Pancytopenia ([place]) 08/16/2015  . Fever and neutropenia ([place]) 08/16/2015  . Weakness 06/03/2015  . Antineoplastic chemotherapy induced anemia 06/03/2015  . Ascites 06/03/2015  . Hypotension 06/03/2015  . Peripheral edema 05/29/2015  . Dehydration 05/29/2015  . Cancer associated pain 03/03/2015  . Genetic testing 02/25/2015  . Hydronephrosis with renal and ureteral calculus obstruction   . 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 Abdominal pain, generalized   . Dry eyes   . E coli bacteremia   . Bacteremia due to Klebsiella pneumoniae   . 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 Bacteremia   . Malnutrition of moderate degree 01/07/2015  . Arterial hypotension   . Biliary obstruction   . Weight loss 11/13/2014  . Diarrhea 11/13/2014  . URI (upper respiratory infection) 12/15/2013  . Hand foot syndrome 12/15/2013  . 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 Anorexia 12/15/2013  . 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 Candida tropicalis infection 10/27/2013  . HAP (hospital-acquired pneumonia) 10/27/2013  . Leucocytosis 10/20/2013  . HCAP (healthcare-associated pneumonia) 10/20/2013  . Fever, unspecified 10/12/2013  . Tachycardia 10/12/2013  . Hypomagnesemia 10/11/2013  .  Protein-calorie malnutrition, severe ([place]) 10/09/2013  . Gastric outlet obstruction 10/08/2013  . Anemia, iron deficiency 10/08/2013  . 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 Stomach  pain 06/12/2013  . Joint pain 07/28/2011    Palliative Care Assessment & Plan  Recommendations/Plan:  Pain: Overnight events reviewed and agree with changes made by Dr. Olevia Bowens for pain management regimen.  She is now only ordered short acting medication on as-needed basis.  I talked at length again with patient's mother to try and discern what medication she has actually been taking at home as she is documented to be on MS Contin 30 mg twice daily as well as oral morphine concentrate 10-20 mg as needed. She tells me that while her daughter is written for scheduled MS Contin 30 mg twice daily, she is actually been taking this on an as-needed basis in addition to her short acting medication on an as-needed basis. She is not really able to tell me the decision process her daughter uses to determine which medication she had been taking. She reports that when she told me that oxycodone made her loopy yesterday, she may have actually meant MS Contin.  Would continue from this point forward using only short acting medication on as-needed basis until it is determined if she does, in fact, require long-acting medication to be scheduled.  I spoke again with her mother regarding MOST form. She reports that she still has not been able to have a good conversation with her daughter regarding wishes moving forward. Patient is too sleepy to do this today with me. We'll plan to follow-up tomorrow to continue conversation. I advised her mom that based upon what she relays about prior conversations, I would recommend DO NOT RESUSCITATE, continued limited additional interventions, IV fluids and antibiotics if needed, and if they were to be elect for feeding tube at any point in the future I would ensure this is done only for a time limited trial to see if it benefits her. I also told her that I did not think that doing feet tube feeds were likely to be a beneficial intervention at any point in the future.  Goals of Care and  Additional Recommendations:  Limitations on Scope of Treatment: Full Scope Treatment  Code Status:    Code Status Orders        Start     Ordered   08/20/15 1149  Full code   Continuous     08/20/15 1148    Code Status History    Date Active Date Inactive Code Status Order ID Comments User Context   01/20/2015  6:16 PM 02/05/2015  2:55 PM Full Code [id number]  Hosie Poisson, MD Inpatient   01/06/2015  2:43 PM 01/11/2015  7:17 PM Full Code [id number]  Erick Colace, NP ED   06/24/2014  1:29 PM 06/25/2014  3:43 AM Full Code [id number]  Greggory Keen, MD HOV   10/21/2013 11:16 AM 10/27/2013  8:14 PM Full Code [id number]  Marybelle Killings, MD Inpatient   10/08/2013 11:25 PM 10/21/2013 11:16 AM Full Code [id number]  Rise Patience, MD Inpatient    Advance Directive Documentation        Most Recent Value   Type of Advance Directive  Healthcare Power of Attorney   Pre-existing out of facility DNR order (yellow form or pink MOST form)     "MOST" Form in Place?        Prognosis:   Unable to determine, however, she has had significant decline in  her functional status and has advanced cancer. Her prognosis is less than 6 months and she should qualify for hospice support if so desired at any point in the future.  Discharge Planning: Home with [place] was discussed with Patient and her mother  Thank you for allowing the Palliative Medicine Team to assist in the care of this patient.   Time In: 1005 Time Out: 1035 Total Time 30 Prolonged Time Billed no      Greater than 50%  of this time was spent counseling and coordinating care related to the above assessment and plan.  Micheline Rough, MD  Please contact Palliative Medicine Team phone at [phone] for questions and concerns.

## 2015-08-21 NOTE — Progress Notes (Addendum)
Nursing Note: Pt up to the bedside commode. Pt sat up but very weak. Had difficulty holding self up in straight sitting position. Pt up to the bedside commode w/ maxx assist.Pt stood but legs gave and this nurse had to support pt till able to seat on BSC.Pt uable to void. Scanned for >400 cc. A: Pt in and out cathed for 275 cc.Pt tolerated well.Pt weaker this morning.Needs 2 person assist for transfers for safety.wbb

## 2015-08-22 DIAGNOSIS — C482 Malignant neoplasm of peritoneum, unspecified: Secondary | ICD-10-CM

## 2015-08-22 DIAGNOSIS — C169 Malignant neoplasm of stomach, unspecified: Secondary | ICD-10-CM

## 2015-08-22 LAB — CULTURE, BODY FLUID W GRAM STAIN -BOTTLE: Culture: NO GROWTH

## 2015-08-22 LAB — CULTURE, BODY FLUID-BOTTLE

## 2015-08-22 MED ORDER — POTASSIUM CHLORIDE CRYS ER 20 MEQ PO TBCR
40.0000 meq | EXTENDED_RELEASE_TABLET | Freq: Two times a day (BID) | ORAL | Status: DC
  Administered 2015-08-22: 40 meq via ORAL
  Filled 2015-08-22: qty 2

## 2015-08-22 MED ORDER — LEVOFLOXACIN 500 MG PO TABS: 500.0000 mg | ORAL_TABLET | Freq: Every day | ORAL | Status: DC

## 2015-08-22 MED ORDER — POTASSIUM CHLORIDE CRYS ER 20 MEQ PO TBCR: 20.0000 meq | EXTENDED_RELEASE_TABLET | Freq: Every day | ORAL | Status: DC

## 2015-08-22 MED ORDER — HYDROXYCHLOROQUINE SULFATE 200 MG PO TABS: 200.0000 mg | ORAL_TABLET | Freq: Two times a day (BID) | ORAL | Status: AC

## 2015-08-22 NOTE — Discharge Summary (Signed)
Physician Discharge Summary  Andrea Best [id number] DOB: 07/05/1970 DOA: 08/16/2015  PCP: Aretta Nip, MD  Admit date: 08/16/2015 Discharge date: 08/22/2015  Time spent: 32minutes  Recommendations for Outpatient Follow-up:  1. Follow up with PCP in 1 week, check a cbc. 2. Follow up with oncology as an outpatient   Discharge Diagnoses:  Active Problems:   Gastric cancer (HCC)   Diarrhea   Malnutrition of moderate degree   Dehydration   Antineoplastic chemotherapy induced anemia   Neutropenic fever (HCC)   Sepsis (HCC)   Occult blood in stools   Pancytopenia (HCC)   Fever and neutropenia (HCC)   Abdominal distention   Acute encephalopathy   Neoplasm related pain   Palliative care encounter   Goals of care, counseling/discussion   Peritoneal carcinoma [facility])   Discharge Condition: Guarded  Diet recommendation: regular  Filed Weights   08/16/15 1719 08/16/15 2235 08/18/15 1659  Weight: 51.71 kg (114 lb) 53 kg (116 lb 13.5 oz) 54.2 kg (119 lb 7.8 oz)    History of present illness:  45 year old female with past medical history significant for gastric cancer and gastric outlet obstruction with lupus that comes into the hospital for one week of diarrhea, 24 hours prior to admission she was started to develop nausea vomiting fever so she came to the ED.  Hospital Course:  Sepsis due to neutropenic fever: On admission she was started empirically on vancomycin and Zosyn IV Aranesp and Neupogen were given with aggressive IV fluid hydration her blood pressure came slightly up, as discussing with the patient blood pressure always runs in the 90s to 100s. Cultures have been negative, she had a biliary stent placed in the past but her bilirubin and LFTs are within normal, paracentesis was done 08/17/2015 the use of October liters of fluid less than 250 polymorphonucleocytes, sitting PCR was negative blood cultures and fluid cultures have been negative. She was changed  to Levaquin which she will continue as an outpatient for 1 additional day.  Chemotherapy-induced diarrhea: She was started on florastor and Imodium her C. difficile PCR was negative. This resolved  Metastatic gastric carcinoma with peritoneal carcinomatosis: She was undergoing chemotherapy at [facility], her peritoneal fluid showed malignant cells. Family with palliative care, she was given the most form she continues to be a full code. Overall I have spoken to the mother and the patient and have related that she has a poor prognosis and overall they seem to understand but would like to go back to [facility] for further evaluation.  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  Severe protein caloric malnutrition continue ensure 3 times a day.  Chronic back pain: She'll was only taking narcotics when necessary at home. Have DC the MS Contin.  Pancytopenia: Secondary to chemotherapy see above it has improved.  Acute encephalopathy: Likely due to narcotics, it was discussed with her mother her narcotic regimen and the mother relates that she was only taking it when necessary. Saw her narcotics were DC'd in house and she will resume them when necessary as an outpatient.    Procedures:  CT abdomen and pelvis  Chest x-ray  Ultrasound-guided paracentesis  Peritoneal fluid  analysis so significant amount of malignant cells  Consultations:  Oncology  PMT  Discharge Exam: Filed Vitals:   08/21/15 2134 08/22/15 0632  BP: 95/50 96/52  Pulse: 111 117  Temp: 98 F (36.7 C) 97.8 F (36.6 C)  Resp:  16 20    General: A&O x3 Cardiovascular: RRR Respiratory: good air movement CTA B/L  Discharge Instructions   Discharge Instructions    Diet - low sodium heart healthy    Complete by:  As directed      Increase activity slowly    Complete by:  As directed           Current Discharge  Medication List    START taking these medications   Details  levofloxacin (LEVAQUIN) 500 MG tablet Take 1 tablet (500 mg total) by mouth daily. Qty: 1 tablet, Refills: 0      CONTINUE these medications which have NOT CHANGED   Details  bumetanide (BUMEX) 1 MG tablet Take 1 mg by mouth daily as needed. Fluid, swelling Refills: 0    Cholecalciferol (VITAMIN D-3 PO) Take 5 mLs by mouth daily.    diphenoxylate-atropine (LOMOTIL) 2.5-0.025 MG tablet Take 1 tablet by mouth every 4 (four) hours as needed. diarrhea Refills: 1    dronabinol (MARINOL) 5 MG capsule Take 5 mg by mouth every 12 (twelve) hours as needed. n/v Refills: 1    DULoxetine (CYMBALTA) 30 MG capsule Take 30 mg by mouth daily. Refills: 1    ferrous sulfate 325 (65 FE) MG tablet Take 325 mg by mouth 3 (three) times a week.     hydroxychloroquine (PLAQUENIL) 200 MG tablet Take 1 tablet (200 mg total) by mouth 2 (two) times daily. Qty: 60 tablet, Refills: 1    morphine (ROXANOL) 20 MG/ML concentrated solution Take 1/2 ml to 54ml every 4 hours as need for pain Qty: 120 mL, Refills: 0   Associated Diagnoses: Cancer of antrum of stomach (HCC)    ondansetron (ZOFRAN) 4 MG tablet Take 1 tablet (4 mg total) by mouth every 8 (eight) hours as needed for nausea or vomiting. Qty: 20 tablet, Refills: 3    potassium chloride SA (K-DUR,KLOR-CON) 20 MEQ tablet Take 1 tablet (20 mEq total) by mouth daily. Qty: 7 tablet, Refills: 0    dexamethasone (DECADRON) 2 MG tablet Take 5 tablets (10 mg) PO at 10PM the night before chemo and 5 tablets at 6AM the morning of first chemo. Qty: 10 tablet, Refills: 0      STOP taking these medications     Artificial Tear Ointment (DRY EYES OP)      morphine (MS CONTIN) 30 MG 12 hr tablet      OVER THE COUNTER MEDICATION      amoxicillin-clavulanate (AUGMENTIN) 875-125 MG tablet        Allergies  Allergen Reactions  . Compazine [Prochlorperazine Maleate] Other (See Comments)     Dystonia  . Azithromycin Rash  . Sulfa Antibiotics Rash    hallucinations  . Hydrocodone-Acetaminophen Nausea And Vomiting    Hallucinating   . 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 SHE DOES NOT WANT ANY BLOOD transfusions but will accept Albumin,       The results of significant diagnostics from this hospitalization (including imaging, microbiology, ancillary and laboratory) are listed below for reference.    Significant Diagnostic Studies: Ct Abdomen Pelvis W Contrast  08/17/2015  CLINICAL DATA:  Acute onset of generalized abdominal distention and diarrhea. Nausea, vomiting and fever. Neutropenia. Initial encounter. EXAM: CT ABDOMEN AND PELVIS WITH CONTRAST TECHNIQUE: Multidetector CT imaging of the abdomen and pelvis was performed using the standard protocol following bolus administration of intravenous contrast. CONTRAST:  32mL ISOVUE-300 IOPAMIDOL (ISOVUE-300) INJECTION 61% COMPARISON:  CT of the chest,  abdomen and pelvis performed 05/14/2015 FINDINGS: Bibasilar airspace opacities may reflect atelectasis or pneumonia. Large volume ascites is noted filling the abdomen and pelvis. Areas of vaguely decreased attenuation are noted within the liver, of uncertain significance. The spleen is borderline normal in size. Diffuse pneumobilia is noted, with a common bile duct stent noted in expected position. The gallbladder is relatively decompressed and not well assessed due to surrounding ascites. Vague masslike density is noted about the pancreatic head, with surrounding postoperative change. The adrenal glands are grossly unremarkable in appearance Underlying splenic and gastric varices are noted. Postoperative change is noted at the stomach. Known metastatic disease along the omentum is difficult to fully assess due to compression by ascites. A right ureteral stent is noted. There is mild right renal atrophy. The kidneys are otherwise unremarkable. No significant hydronephrosis is seen. No renal or  ureteral stones are identified. Evaluation for perinephric stranding is limited due to surrounding ascites. There is diffuse wall thickening involving much of the distal and terminal ileum, concerning for acute infectious or inflammatory ileitis. The remainder of the small bowel is grossly unremarkable. The stomach is within normal limits. No acute vascular abnormalities are seen. The appendix is not definitely seen; there is no evidence of appendicitis. The colon is grossly unremarkable in appearance. Vague soft tissue inflammation is noted about the rectum, of uncertain significance. The bladder is moderately distended and grossly unremarkable. The uterus is grossly unremarkable in appearance. Bilateral cystic lesions are noted at the ovaries, measuring 7.6 cm on the right and 4.2 cm on the left, of uncertain significance. No inguinal lymphadenopathy is seen. No acute osseous abnormalities are identified. IMPRESSION: 1. Large volume ascites noted filling the abdomen and pelvis, markedly increased from the prior study. 2. Diffuse wall thickening noted along the distal and terminal ileum, concerning for acute infectious or inflammatory ileitis. 3. Known metastatic disease is difficult to fully assess given the extent of underlying ascites. 4. Vague masslike density about the pancreatic head, with surrounding postoperative change. Common bile duct stent noted in unchanged position, with underlying pneumobilia. 5. Bibasilar airspace opacities may reflect atelectasis or pneumonia. 6. Areas of vaguely decreased attenuation within the liver, of uncertain significance. Underlying metastatic disease cannot be excluded. 7. Mild right renal atrophy noted. 8. Vague nonspecific soft tissue inflammation about the rectum. 9. Bilateral cystic foci again noted at the ovaries, which could reflect metastatic disease to the ovaries from the patient's gastric primary, though the appearance is nonspecific. This is somewhat larger on  the right and smaller on the left. Electronically Signed   By: Garald Balding M.D.   On: 08/17/2015 03:51   US Paracentesis  08/17/2015  INDICATION: History of metastatic gastric cancer with abdominal ascites. Request has been made for diagnostic and therapeutic paracentesis. EXAM: ULTRASOUND GUIDED DIAGNOSTIC AND THERAPEUTIC PARACENTESIS MEDICATIONS: 1% lidocaine COMPLICATIONS: None immediate. PROCEDURE: Informed written consent was obtained from the patient after a discussion of the risks, benefits and alternatives to treatment. A timeout was performed prior to the initiation of the procedure. Initial ultrasound scanning demonstrates a moderate amount of ascites within the left lower abdominal quadrant. The left lower abdomen was prepped and draped in the usual sterile fashion. 1% lidocaine was used for local anesthesia. Following this, a 19 gauge, 7-cm, Yueh catheter was introduced. An ultrasound image was saved for documentation purposes. The paracentesis was performed. The catheter was removed and a dressing was applied. The patient tolerated the procedure well without immediate post procedural complication. FINDINGS: A  total of approximately 3.3 L of clear yellow fluid was removed. Samples were sent to the laboratory as requested by the clinical team. IMPRESSION: Successful ultrasound-guided paracentesis yielding 3.3 liters of peritoneal fluid. Read by: Saverio Danker, PA-C Electronically Signed   By: Corrie Mckusick D.O.   On: 08/17/2015 16:18   Dg Chest Port 1 View  08/16/2015  CLINICAL DATA:  Stomach cancer, diarrhea for 2 weeks, nausea, vomiting EXAM: PORTABLE CHEST 1 VIEW COMPARISON:  05/14/2015 FINDINGS: There are bilateral low lung volumes. There is bibasilar airspace disease which may reflect atelectasis versus pneumonia. There is no pleural effusion or pneumothorax. The heart and mediastinal contours are unremarkable. There is a right-sided Port-A-Cath in satisfactory position. The osseous  structures are unremarkable. IMPRESSION: Low lung volumes. Bibasilar airspace disease which may reflect atelectasis versus pneumonia. Electronically Signed   By: Kathreen Devoid   On: 08/16/2015 16:59    Microbiology: Recent Results (from the past 240 hour(s))  Culture, blood (Routine X 2)     Status: None   Collection Time: 08/16/15  4:13 PM  Result Value Ref Range Status   Specimen Description BLOOD LEFT FOREARM  Final   Special Requests BOTTLES DRAWN AEROBIC AND ANAEROBIC 5CC  Final   Culture   Final    NO GROWTH 5 DAYS Performed at [facility]    Report Status 08/21/2015 FINAL  Final  Culture, blood (Routine X 2)     Status: None   Collection Time: 08/16/15  4:14 PM  Result Value Ref Range Status   Specimen Description BLOOD RIGHT ANTECUBITAL  Final   Special Requests BOTTLES DRAWN AEROBIC AND ANAEROBIC 5CC  Final   Culture   Final    NO GROWTH 5 DAYS Performed at [facility]    Report Status 08/21/2015 FINAL  Final  C difficile quick scan w PCR reflex     Status: None   Collection Time: 08/16/15  4:53 PM  Result Value Ref Range Status   C Diff antigen NEGATIVE NEGATIVE Final   C Diff toxin NEGATIVE NEGATIVE Final   C Diff interpretation Negative for toxigenic C. difficile  Final  MRSA PCR Screening     Status: None   Collection Time: 08/17/15 12:27 AM  Result Value Ref Range Status   MRSA by PCR NEGATIVE NEGATIVE Final    Comment:        The GeneXpert MRSA Assay (FDA approved for NASAL specimens only), is one component of a comprehensive MRSA colonization surveillance program. It is not intended to diagnose MRSA infection nor to guide or monitor treatment for MRSA infections.   Culture, body fluid-bottle     Status: None (Preliminary result)   Collection Time: 08/17/15  4:01 PM  Result Value Ref Range Status   Specimen Description FLUID ABDOMEN PERITONEAL  Final   Special Requests BOTTLES DRAWN AEROBIC AND ANAEROBIC 10CC  Final   Culture   Final     NO GROWTH 4 DAYS Performed at [facility]    Report Status PENDING  Incomplete  Gram stain     Status: None   Collection Time: 08/17/15  4:01 PM  Result Value Ref Range Status   Specimen Description FLUID ABDOMEN PERITONEAL  Final   Special Requests NONE  Final   Gram Stain   Final    FEW WBC PRESENT, PREDOMINANTLY MONONUCLEAR NO ORGANISMS SEEN Performed at [facility]    Report Status 08/17/2015 FINAL  Final  Urine culture  Status: Abnormal   Collection Time: 08/18/15  9:21 AM  Result Value Ref Range Status   Specimen Description URINE, RANDOM  Final   Special Requests NONE  Final   Culture (A)  Final    <10,000 COLONIES/mL INSIGNIFICANT GROWTH Performed at [facility]    Report Status 08/19/2015 FINAL  Final     Labs: Basic Metabolic Panel:  Recent Labs Lab 08/16/15 1615 08/17/15 0308 08/18/15 0435 08/21/15 0900  NA 134* 135 135 135  K 3.3* 3.2* 3.5 2.4*  CL 104 107 110 113*  CO2 24 22 21* 18*  GLUCOSE 111* 106* 97 91  BUN 9 7 6 7   CREATININE 0.80 0.67 0.54 0.87  CALCIUM 7.3* 7.1* 7.4* 8.2*  MG 1.7  --   --   --   PHOS 2.3*  --   --   --    Liver Function Tests:  Recent Labs Lab 08/16/15 1615 08/18/15 0435 08/21/15 0900  AST 16 11* 13*  ALT 9* 7* 9*  ALKPHOS 68 53 76  BILITOT 0.7 0.6 1.0  PROT 5.6* 4.3* 4.1*  ALBUMIN 1.8* 1.4* 1.3*   No results for input(s): LIPASE, AMYLASE in the last 168 hours. No results for input(s): AMMONIA in the last 168 hours. CBC:  Recent Labs Lab 08/16/15 1615 08/17/15 0308 08/18/15 0435 08/19/15 0420 08/21/15 0900  WBC 0.5* 0.4* 1.0* 4.5 13.1*  NEUTROABS 0.0*  --  0.1* 2.9  --   HGB 6.4* 7.0* 6.2* 7.2* 7.2*  HCT 19.4* 20.9* 18.9* 22.6* 21.9*  MCV 84.7 82.9 83.3 86.9 85.5  PLT 70* 73* 88* 189 149*   Cardiac Enzymes: No results for input(s): CKTOTAL, CKMB, CKMBINDEX, TROPONINI in the last 168 hours. BNP: BNP (last 3 results)  Recent Labs  01/06/15 1450 03/07/15 0236  BNP  108.2* 94.7    ProBNP (last 3 results) No results for input(s): PROBNP in the last 8760 hours.  CBG: No results for input(s): GLUCAP in the last 168 hours.     Signed:  Charlynne Cousins MD.  [facility] 08/22/2015, 8:11 AM

## 2015-08-22 NOTE — Progress Notes (Signed)
Discharge medications and instructions reviewed with patient's mother for d/c. Patient to be assisted to vehicle by wheelchair.

## 2015-08-24 ENCOUNTER — Emergency Department (HOSPITAL_COMMUNITY): Payer: BLUE CROSS/BLUE SHIELD

## 2015-08-24 ENCOUNTER — Encounter (HOSPITAL_COMMUNITY): Payer: Self-pay | Admitting: Emergency Medicine

## 2015-08-24 ENCOUNTER — Inpatient Hospital Stay (HOSPITAL_COMMUNITY)
Admission: EM | Admit: 2015-08-24 | Discharge: 2015-08-28 | DRG: 193 | Disposition: A | Discharge status: Home Health | Payer: BLUE CROSS/BLUE SHIELD | Attending: Internal Medicine | Admitting: Internal Medicine

## 2015-08-24 DIAGNOSIS — J9601 Acute respiratory failure with hypoxia: Secondary | ICD-10-CM | POA: Diagnosis not present

## 2015-08-24 DIAGNOSIS — K5900 Constipation, unspecified: Secondary | ICD-10-CM | POA: Diagnosis present

## 2015-08-24 DIAGNOSIS — R188 Other ascites: Secondary | ICD-10-CM | POA: Diagnosis present

## 2015-08-24 DIAGNOSIS — Z681 Body mass index (BMI) 19 or less, adult: Secondary | ICD-10-CM

## 2015-08-24 DIAGNOSIS — Z800 Family history of malignant neoplasm of digestive organs: Secondary | ICD-10-CM | POA: Diagnosis not present

## 2015-08-24 DIAGNOSIS — Z903 Acquired absence of stomach [part of]: Secondary | ICD-10-CM | POA: Diagnosis not present

## 2015-08-24 DIAGNOSIS — Z881 Allergy status to other antibiotic agents status: Secondary | ICD-10-CM | POA: Diagnosis not present

## 2015-08-24 DIAGNOSIS — C799 Secondary malignant neoplasm of unspecified site: Secondary | ICD-10-CM | POA: Diagnosis present

## 2015-08-24 DIAGNOSIS — J189 Pneumonia, unspecified organism: Secondary | ICD-10-CM | POA: Diagnosis present

## 2015-08-24 DIAGNOSIS — E876 Hypokalemia: Secondary | ICD-10-CM | POA: Diagnosis present

## 2015-08-24 DIAGNOSIS — Z833 Family history of diabetes mellitus: Secondary | ICD-10-CM | POA: Diagnosis not present

## 2015-08-24 DIAGNOSIS — Z515 Encounter for palliative care: Secondary | ICD-10-CM | POA: Insufficient documentation

## 2015-08-24 DIAGNOSIS — C169 Malignant neoplasm of stomach, unspecified: Secondary | ICD-10-CM | POA: Diagnosis present

## 2015-08-24 DIAGNOSIS — G893 Neoplasm related pain (acute) (chronic): Secondary | ICD-10-CM | POA: Diagnosis present

## 2015-08-24 DIAGNOSIS — Z888 Allergy status to other drugs, medicaments and biological substances status: Secondary | ICD-10-CM

## 2015-08-24 DIAGNOSIS — Z885 Allergy status to narcotic agent status: Secondary | ICD-10-CM | POA: Diagnosis not present

## 2015-08-24 DIAGNOSIS — Z8249 Family history of ischemic heart disease and other diseases of the circulatory system: Secondary | ICD-10-CM | POA: Diagnosis not present

## 2015-08-24 DIAGNOSIS — J45909 Unspecified asthma, uncomplicated: Secondary | ICD-10-CM | POA: Diagnosis present

## 2015-08-24 DIAGNOSIS — T451X5A Adverse effect of antineoplastic and immunosuppressive drugs, initial encounter: Secondary | ICD-10-CM | POA: Diagnosis present

## 2015-08-24 DIAGNOSIS — K219 Gastro-esophageal reflux disease without esophagitis: Secondary | ICD-10-CM | POA: Diagnosis present

## 2015-08-24 DIAGNOSIS — Z882 Allergy status to sulfonamides status: Secondary | ICD-10-CM

## 2015-08-24 DIAGNOSIS — E43 Unspecified severe protein-calorie malnutrition: Secondary | ICD-10-CM | POA: Diagnosis present

## 2015-08-24 DIAGNOSIS — D701 Agranulocytosis secondary to cancer chemotherapy: Secondary | ICD-10-CM | POA: Diagnosis present

## 2015-08-24 DIAGNOSIS — Z66 Do not resuscitate: Secondary | ICD-10-CM | POA: Diagnosis present

## 2015-08-24 DIAGNOSIS — Z531 Procedure and treatment not carried out because of patient's decision for reasons of belief and group pressure: Secondary | ICD-10-CM

## 2015-08-24 DIAGNOSIS — J80 Acute respiratory distress syndrome: Principal | ICD-10-CM | POA: Diagnosis present

## 2015-08-24 DIAGNOSIS — M3500 Sicca syndrome, unspecified: Secondary | ICD-10-CM | POA: Diagnosis present

## 2015-08-24 DIAGNOSIS — R0602 Shortness of breath: Secondary | ICD-10-CM | POA: Diagnosis present

## 2015-08-24 DIAGNOSIS — Y95 Nosocomial condition: Secondary | ICD-10-CM | POA: Diagnosis present

## 2015-08-24 DIAGNOSIS — J9600 Acute respiratory failure, unspecified whether with hypoxia or hypercapnia: Secondary | ICD-10-CM | POA: Diagnosis not present

## 2015-08-24 DIAGNOSIS — R451 Restlessness and agitation: Secondary | ICD-10-CM | POA: Diagnosis present

## 2015-08-24 DIAGNOSIS — R64 Cachexia: Secondary | ICD-10-CM | POA: Diagnosis present

## 2015-08-24 DIAGNOSIS — D638 Anemia in other chronic diseases classified elsewhere: Secondary | ICD-10-CM | POA: Diagnosis present

## 2015-08-24 DIAGNOSIS — D6481 Anemia due to antineoplastic chemotherapy: Secondary | ICD-10-CM | POA: Diagnosis present

## 2015-08-24 DIAGNOSIS — Z7952 Long term (current) use of systemic steroids: Secondary | ICD-10-CM | POA: Diagnosis not present

## 2015-08-24 DIAGNOSIS — Z79899 Other long term (current) drug therapy: Secondary | ICD-10-CM | POA: Diagnosis not present

## 2015-08-24 DIAGNOSIS — M329 Systemic lupus erythematosus, unspecified: Secondary | ICD-10-CM | POA: Diagnosis present

## 2015-08-24 DIAGNOSIS — J9690 Respiratory failure, unspecified, unspecified whether with hypoxia or hypercapnia: Secondary | ICD-10-CM

## 2015-08-24 DIAGNOSIS — C163 Malignant neoplasm of pyloric antrum: Secondary | ICD-10-CM

## 2015-08-24 LAB — COMPREHENSIVE METABOLIC PANEL
ALT: 13 U/L — ABNORMAL LOW (ref 14–54)
AST: 28 U/L (ref 15–41)
Albumin: 1.7 g/dL — ABNORMAL LOW (ref 3.5–5.0)
Alkaline Phosphatase: 115 U/L (ref 38–126)
Anion gap: 4 — ABNORMAL LOW (ref 5–15)
BUN: 17 mg/dL (ref 6–20)
CO2: 19 mmol/L — ABNORMAL LOW (ref 22–32)
Calcium: 9.1 mg/dL (ref 8.9–10.3)
Chloride: 117 mmol/L — ABNORMAL HIGH (ref 101–111)
Creatinine, Ser: 0.85 mg/dL (ref 0.44–1.00)
GFR calc Af Amer: >60 mL/min (ref >60)
GFR calc non Af Amer: >60 mL/min (ref >60)
Glucose, Bld: 120 mg/dL — ABNORMAL HIGH (ref 65–99)
Potassium: 3.2 mmol/L — ABNORMAL LOW (ref 3.5–5.1)
Sodium: 140 mmol/L (ref 135–145)
Total Bilirubin: 0.9 mg/dL (ref 0.3–1.2)
Total Protein: 5.1 g/dL — ABNORMAL LOW (ref 6.5–8.1)

## 2015-08-24 LAB — URINALYSIS, ROUTINE W REFLEX MICROSCOPIC
Bilirubin Urine: NEGATIVE
Glucose, UA: NEGATIVE mg/dL
Ketones, ur: NEGATIVE mg/dL
Nitrite: NEGATIVE
Protein, ur: 100 mg/dL — AB
Specific Gravity, Urine: 1.02 (ref 1.005–1.030)
pH: 6 (ref 5.0–8.0)

## 2015-08-24 LAB — CBC WITH DIFFERENTIAL/PLATELET
Band Neutrophils: 6 %
Basophils Absolute: 0 10*3/uL (ref 0.0–0.1)
Basophils Relative: 0 %
Blasts: 0 %
Eosinophils Absolute: 0 10*3/uL (ref 0.0–0.7)
Eosinophils Relative: 0 %
HCT: 22 % — ABNORMAL LOW (ref 36.0–46.0)
Hemoglobin: 7.3 g/dL — ABNORMAL LOW (ref 12.0–15.0)
Lymphocytes Relative: 4 %
Lymphs Abs: 1.5 10*3/uL (ref 0.7–4.0)
MCH: 29 pg (ref 26.0–34.0)
MCHC: 33.2 g/dL (ref 30.0–36.0)
MCV: 87.3 fL (ref 78.0–100.0)
Metamyelocytes Relative: 5 %
Monocytes Absolute: 1.8 10*3/uL — ABNORMAL HIGH (ref 0.1–1.0)
Monocytes Relative: 5 %
Myelocytes: 2 %
Neutro Abs: 33.6 10*3/uL — ABNORMAL HIGH (ref 1.7–7.7)
Neutrophils Relative %: 78 %
Other: 0 %
Platelets: 124 10*3/uL — ABNORMAL LOW (ref 150–400)
Promyelocytes Absolute: 0 %
RBC: 2.52 MIL/uL — ABNORMAL LOW (ref 3.87–5.11)
RDW: 26.4 % — ABNORMAL HIGH (ref 11.5–15.5)
WBC: 36.9 10*3/uL — ABNORMAL HIGH (ref 4.0–10.5)
nRBC: 0 /100 WBC

## 2015-08-24 LAB — BLOOD GAS, ARTERIAL
ACID-BASE DEFICIT: 6.8 mmol/L — AB (ref 0.0–2.0)
Bicarbonate: 18.3 mEq/L — ABNORMAL LOW (ref 20.0–24.0)
DRAWN BY: 276051
FIO2: 1
O2 SAT: 99.2 %
PATIENT TEMPERATURE: 98.6
PCO2 ART: 37.8 mmHg (ref 35.0–45.0)
TCO2: 17.8 mmol/L (ref 0–100)
pH, Arterial: 7.307 — ABNORMAL LOW (ref 7.350–7.450)
pO2, Arterial: 248 mmHg — ABNORMAL HIGH (ref 80.0–100.0)

## 2015-08-24 LAB — URINE MICROSCOPIC-ADD ON

## 2015-08-24 LAB — TROPONIN I: Troponin I: <0.03 ng/mL (ref <0.031)

## 2015-08-24 LAB — I-STAT CG4 LACTIC ACID, ED: Lactic Acid, Venous: 1.64 mmol/L (ref 0.5–2.0)

## 2015-08-24 MED ORDER — HYDROXYCHLOROQUINE SULFATE 200 MG PO TABS
400.0000 mg | ORAL_TABLET | Freq: Every day | ORAL | Status: DC
Start: 2015-08-24 — End: 2015-08-28
  Administered 2015-08-25 – 2015-08-27 (×3): 400 mg via ORAL
  Filled 2015-08-24 (×5): qty 2

## 2015-08-24 MED ORDER — METHYLPREDNISOLONE SODIUM SUCC 125 MG IJ SOLR
60.0000 mg | Freq: Two times a day (BID) | INTRAMUSCULAR | Status: DC
  Administered 2015-08-24: 60 mg via INTRAVENOUS
  Filled 2015-08-24: qty 2

## 2015-08-24 MED ORDER — MORPHINE SULFATE (CONCENTRATE) 10 MG/0.5ML PO SOLN
10.0000 mg | ORAL | Status: DC | PRN
  Administered 2015-08-28: 10 mg via ORAL
  Filled 2015-08-24 (×2): qty 0.5

## 2015-08-24 MED ORDER — DIPHENOXYLATE-ATROPINE 2.5-0.025 MG PO TABS: 1.0000 | ORAL_TABLET | ORAL | Status: DC | PRN

## 2015-08-24 MED ORDER — VANCOMYCIN HCL IN DEXTROSE 1-5 GM/200ML-% IV SOLN
1000.0000 mg | INTRAVENOUS | Status: AC
Start: 2015-08-24 — End: 2015-08-24
  Administered 2015-08-24: 1000 mg via INTRAVENOUS
  Filled 2015-08-24: qty 200

## 2015-08-24 MED ORDER — KCL IN DEXTROSE-NACL 20-5-0.45 MEQ/L-%-% IV SOLN
INTRAVENOUS | Status: DC
  Administered 2015-08-24 – 2015-08-27 (×3): via INTRAVENOUS
  Filled 2015-08-24 (×3): qty 1000

## 2015-08-24 MED ORDER — DEXTROSE 5 % IV SOLN
1.0000 g | Freq: Once | INTRAVENOUS | Status: AC
  Administered 2015-08-24: 1 g via INTRAVENOUS
  Filled 2015-08-24: qty 1

## 2015-08-24 MED ORDER — ENOXAPARIN SODIUM 40 MG/0.4ML ~~LOC~~ SOLN
40.0000 mg | SUBCUTANEOUS | Status: DC
  Administered 2015-08-24 – 2015-08-27 (×4): 40 mg via SUBCUTANEOUS
  Filled 2015-08-24 (×4): qty 0.4

## 2015-08-24 MED ORDER — MORPHINE SULFATE (PF) 2 MG/ML IV SOLN
2.0000 mg | INTRAVENOUS | Status: DC | PRN
  Administered 2015-08-25 (×3): 2 mg via INTRAVENOUS
  Administered 2015-08-26: 4 mg via INTRAVENOUS
  Administered 2015-08-26 – 2015-08-28 (×6): 2 mg via INTRAVENOUS
  Filled 2015-08-24 (×5): qty 1
  Filled 2015-08-24: qty 2
  Filled 2015-08-24 (×4): qty 1

## 2015-08-24 MED ORDER — DRONABINOL 2.5 MG PO CAPS: 5.0000 mg | ORAL_CAPSULE | Freq: Two times a day (BID) | ORAL | Status: DC | PRN

## 2015-08-24 MED ORDER — METHYLPREDNISOLONE SODIUM SUCC 125 MG IJ SOLR
60.0000 mg | Freq: Four times a day (QID) | INTRAMUSCULAR | Status: DC
  Administered 2015-08-25 – 2015-08-27 (×9): 60 mg via INTRAVENOUS
  Filled 2015-08-24 (×9): qty 2

## 2015-08-24 MED ORDER — ONDANSETRON HCL 4 MG/2ML IJ SOLN
4.0000 mg | Freq: Four times a day (QID) | INTRAMUSCULAR | Status: DC | PRN
  Administered 2015-08-27: 4 mg via INTRAVENOUS
  Filled 2015-08-24: qty 2

## 2015-08-24 MED ORDER — DULOXETINE HCL 30 MG PO CPEP
30.0000 mg | ORAL_CAPSULE | Freq: Every day | ORAL | Status: DC
  Administered 2015-08-25 – 2015-08-28 (×4): 30 mg via ORAL
  Filled 2015-08-24 (×5): qty 1

## 2015-08-24 MED ORDER — VANCOMYCIN HCL IN DEXTROSE 750-5 MG/150ML-% IV SOLN
750.0000 mg | Freq: Two times a day (BID) | INTRAVENOUS | Status: DC
  Administered 2015-08-25 – 2015-08-26 (×3): 750 mg via INTRAVENOUS
  Filled 2015-08-24 (×3): qty 150

## 2015-08-24 MED ORDER — FUROSEMIDE 10 MG/ML IJ SOLN
40.0000 mg | Freq: Once | INTRAMUSCULAR | Status: AC
  Administered 2015-08-24: 40 mg via INTRAVENOUS
  Filled 2015-08-24: qty 4

## 2015-08-24 MED ORDER — DEXTROSE 5 % IV SOLN
1.0000 g | Freq: Three times a day (TID) | INTRAVENOUS | Status: DC
  Administered 2015-08-25 – 2015-08-27 (×7): 1 g via INTRAVENOUS
  Filled 2015-08-24 (×8): qty 1

## 2015-08-24 NOTE — Progress Notes (Signed)
Pt transported to ICU on BIPAP.  Pt tolerated well with no apparent complication, RT to monitor and assess as needed.

## 2015-08-24 NOTE — Progress Notes (Signed)
RT called to bedside due to Pt not tolerating mask on Black box BIPAP.  Pt switched to NIV/PC on Vent.  Pt tolerating much better at this time.  RT to monitor and assess as needed.

## 2015-08-24 NOTE — Progress Notes (Addendum)
Pharmacy Antibiotic Note  Andrea Best is a 45 y.o. female admitted on 08/24/2015 with pneumonia.  Recent admission (discharged on 08/22/15).  Patient presents with c/o shortness of breath.  PMH significant for Gastric Cancer (undergoing treatment in [place]), lupus and chornic anemia.  Pharmacy has been consulted for Vancomycin & Cefepime dosing.  Plan:  Vancomycin 1gm IV x 1 followed by 750mg  IV q12h  Vancomycin trough goal: 15-20 mcg/ml  Cefepime 1gm IV q8h  F/U cultures/sensitivities  Follow renal function    Temp (24hrs), Avg:97.5 F (36.4 C), Min:97.3 F (36.3 C), Max:97.8 F (36.6 C)   Recent Labs Lab 08/18/15 0435 08/19/15 0420 08/21/15 0900 08/24/15 1742 08/24/15 1752  WBC 1.0* 4.5 13.1* 36.9*  --   CREATININE 0.54  --  0.87 0.85  --   LATICACIDVEN  --   --   --   --  1.64    Estimated Creatinine Clearance: 73.8 mL/min (by C-G formula based on Cr of 0.85).    Allergies  Allergen Reactions  . Compazine [Prochlorperazine Maleate] Other (See Comments)    Dystonia  . Azithromycin Rash  . Sulfa Antibiotics Rash    hallucinations  . Hydrocodone-Acetaminophen Nausea And Vomiting    Hallucinating   . 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 SHE DOES NOT WANT ANY BLOOD transfusions but will accept Albumin,     Antimicrobials this admission: 5/30 Vanc >>   5/30 Cefepime >>    Dose adjustments this admission:    Microbiology results: 5/30 BCx:   5/30 UCx:     Thank you for allowing pharmacy to be a part of this patient's care.  Everette Rank, PharmD 08/24/2015 7:26 PM

## 2015-08-24 NOTE — ED Notes (Signed)
Bed: [id number] Expected date:  Expected time:  Means of arrival:  Comments: EMS- 70F, SOB/Bipap

## 2015-08-24 NOTE — ED Notes (Signed)
Chest XRAY being done.

## 2015-08-24 NOTE — ED Notes (Signed)
[facility]- with SOB since yesterday, Sating 40% upon their arrival. Placed on BIPAP. Shallow resp. Alert/O.

## 2015-08-24 NOTE — H&P (Signed)
History and Physical    Andrea Best [id number] DOB: January 07, 1971 DOA: 08/24/2015   PCP: Aretta Nip, MD Chief Complaint:  Chief Complaint  Patient presents with  . Shortness of Breath    HPI: Andrea Best is a 45 y.o. female with medical history significant of metastatic gastric cancer, severe malnutrition, lupus, sjogren's, recent admission on the 22nd to 28th for febrile neutropenia treated with broad spectrum ABx and Granix.  Today patient became severely short of breath, symptoms onset last night and rapidly progressed throughout the day.  Family purchased a pulse ox device which showed that her pulse ox was low.  Initially patient was resistant to coming to hospital, but family called EMS due to patient continuing to get worse and becoming so sick that she couldn't really say no.  ED Course: Patient has severe PaO2 to FiO2 gradient on ABG.  Leukocytosis, tachypnea, salvaged with bipap for the moment.  CXR shows diffuse bilateral airspace disease.  Review of Systems: As per HPI otherwise 10 point review of systems negative.    Past Medical History  Diagnosis Date  . Eczema   . Lupus (HCC)     joint pain and extreme fatique - improved after plaquenil and prednisone therapy  . Sjogren's disease ([place])     caused pt to loose her teeth due to dry mouth;  prone to eye irritation and infection due to dry eye; affects immune system - PRONE TO INFECTIONS   . Allergy   . Tachycardia   . Protein-calorie malnutrition, severe ([place])   . Abnormal findings on esophagogastroduodenoscopy (EGD) 09/22/13    esophagitis  . Asthma     no recent flare ups - no inhalers - as of 11/19/13  . GERD (gastroesophageal reflux disease)   . Anemia     iron deficiency   . Pneumonia     health care -associated July 2015 post op gastrectomy surgery  . Gastric cancer ([place]) 09/22/13 &10/10/13    adenocarcinoma/metastatic  FIRST CHEMO WAS 11/14/13 - NEXT CHEMO TREATMENT IS 11/21/13.    Past  Surgical History  Procedure Laterality Date  . Cesarean section      1997  . Laparotomy N/A 10/10/2013    Procedure: SUBTOTAL GASTRECTOMY WITH GASTRIC JEJUNOSOTMY;  Surgeon: Edward Jolly, MD;  Location: [facility];  Service: General;  Laterality: N/A;  . Portacath placement Right 11/20/2013    Procedure: INSERTION PORT-A-CATH with ULTRA SOUND;  Surgeon: Adin Hector, MD;  Location: [facility];  Service: General;  Laterality: Right;  . Cystoscopy w/ ureteral stent placement Right 02/01/2015    Procedure: CYSTOSCOPY WITH RETROGRADE PYELOGRAM/URETERAL STENT PLACEMENT;  Surgeon: Festus Aloe, MD;  Location: [facility];  Service: Urology;  Laterality: Right;     reports that she has never smoked. She has never used smokeless tobacco. She reports that she does not drink alcohol or use illicit drugs.  Allergies  Allergen Reactions  . Compazine [Prochlorperazine Maleate] Other (See Comments)    Dystonia  . Azithromycin Rash  . Sulfa Antibiotics Rash    hallucinations  . Hydrocodone-Acetaminophen Nausea And Vomiting    Hallucinating   . 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 SHE DOES NOT WANT ANY BLOOD transfusions but will accept Albumin,     Family History  Problem Relation Age of Onset  . Hypertension Mother   . Diabetes Mother   . Diabetes Maternal Grandmother   . Diabetes Maternal Grandfather   . HIV/AIDS Maternal Uncle   .  Stomach cancer Other     MGMs sister     Prior to Admission medications   Medication Sig Start Date End Date Taking? Authorizing Provider  Cholecalciferol (VITAMIN D-3 PO) Take 5 mLs by mouth daily.   Yes Historical Provider, MD  diphenoxylate-atropine (LOMOTIL) 2.5-0.025 MG tablet Take 1 tablet by mouth every 4 (four) hours as needed. diarrhea 08/13/15  Yes Historical Provider, MD  dronabinol (MARINOL) 5 MG capsule Take 5 mg by mouth every 12 (twelve) hours as needed (weight loss). n/v 06/29/15  Yes Historical Provider, MD  morphine (ROXANOL) 20 MG/ML  concentrated solution Take 1/2 ml to 64ml every 4 hours as need for pain 04/21/15  Yes Owens Shark, NP  Multiple Vitamins-Minerals (MULTIVITAMIN & MINERAL PO) Take 1 tablet by mouth daily.   Yes Historical Provider, MD  ondansetron (ZOFRAN) 4 MG tablet Take 1 tablet (4 mg total) by mouth every 8 (eight) hours as needed for nausea or vomiting. 12/02/14  Yes Owens Shark, NP  potassium chloride SA (K-DUR,KLOR-CON) 20 MEQ tablet Take 1 tablet (20 mEq total) by mouth daily. 03/07/15  Yes Orpah Greek, MD  DULoxetine (CYMBALTA) 30 MG capsule Take 30 mg by mouth daily. 07/08/15   Historical Provider, MD  ferrous sulfate 325 (65 FE) MG tablet Take 325 mg by mouth 3 (three) times a week.     Historical Provider, MD  hydroxychloroquine (PLAQUENIL) 200 MG tablet Take 1 tablet (200 mg total) by mouth 2 (two) times daily. Patient taking differently: Take 400 mg by mouth at bedtime.  08/22/15   Charlynne Cousins, MD    Physical Exam: Marcelline Mates:   08/24/15 1908 08/24/15 1930 08/24/15 1933 08/24/15 2108  BP: 112/77 113/83 112/77 113/85  Pulse:  114 113 109  Temp:      TempSrc:      Resp: 43 27 28 34  SpO2:  100% 100% 100%      Constitutional: NAD, calm, comfortable Eyes: PERRL, lids and conjunctivae normal ENMT: Mucous membranes are moist. Posterior pharynx clear of any exudate or lesions.Normal dentition.  Neck: normal, supple, no masses, no thyromegaly Respiratory: clear to auscultation bilaterally, no wheezing, no crackles. Normal respiratory effort. No accessory muscle use.  Cardiovascular: Regular rate and rhythm, no murmurs / rubs / gallops. No extremity edema. 2+ pedal pulses. No carotid bruits.  Abdomen: no tenderness, no masses palpated. No hepatosplenomegaly. Bowel sounds positive.  Musculoskeletal: no clubbing / cyanosis. No joint deformity upper and lower extremities. Good ROM, no contractures. Normal muscle tone.  Skin: no rashes, lesions, ulcers. No induration Neurologic: CN  2-12 grossly intact. Sensation intact, DTR normal. Strength 5/5 in all 4.  Psychiatric: Normal judgment and insight. Alert and oriented x 3. Normal mood.    Labs on Admission: I have personally reviewed following labs and imaging studies  CBC:  Recent Labs Lab 08/18/15 0435 08/19/15 0420 08/21/15 0900 08/24/15 1742  WBC 1.0* 4.5 13.1* 36.9*  NEUTROABS 0.1* 2.9  --  33.6*  HGB 6.2* 7.2* 7.2* 7.3*  HCT 18.9* 22.6* 21.9* 22.0*  MCV 83.3 86.9 85.5 87.3  PLT 88* 189 149* [id number]*   Basic Metabolic Panel:  Recent Labs Lab 08/18/15 0435 08/21/15 0900 08/24/15 1742  NA 135 135 140  K 3.5 2.4* 3.2*  CL 110 113* 117*  CO2 21* 18* 19*  GLUCOSE 97 91 120*  BUN 6 7 17   CREATININE 0.54 0.87 0.85  CALCIUM 7.4* 8.2* 9.1   GFR: Estimated Creatinine Clearance: 73.8 mL/min (  by C-G formula based on Cr of 0.85). Liver Function Tests:  Recent Labs Lab 08/18/15 0435 08/21/15 0900 08/24/15 1742  AST 11* 13* 28  ALT 7* 9* 13*  ALKPHOS 53 76 115  BILITOT 0.6 1.0 0.9  PROT 4.3* 4.1* 5.1*  ALBUMIN 1.4* 1.3* 1.7*   No results for input(s): LIPASE, AMYLASE in the last 168 hours. No results for input(s): AMMONIA in the last 168 hours. Coagulation Profile: No results for input(s): INR, PROTIME in the last 168 hours. Cardiac Enzymes:  Recent Labs Lab 08/24/15 1742  TROPONINI <0.03   BNP (last 3 results) No results for input(s): PROBNP in the last 8760 hours. HbA1C: No results for input(s): HGBA1C in the last 72 hours. CBG: No results for input(s): GLUCAP in the last 168 hours. Lipid Profile: No results for input(s): CHOL, HDL, LDLCALC, TRIG, CHOLHDL, LDLDIRECT in the last 72 hours. Thyroid Function Tests: No results for input(s): TSH, T4TOTAL, FREET4, T3FREE, THYROIDAB in the last 72 hours. Anemia Panel: No results for input(s): VITAMINB12, FOLATE, FERRITIN, TIBC, IRON, RETICCTPCT in the last 72 hours. Urine analysis:    Component Value Date/Time   COLORURINE YELLOW  08/24/2015 1842   APPEARANCEUR CLOUDY* 08/24/2015 1842   LABSPEC 1.020 08/24/2015 1842   LABSPEC 1.015 01/20/2015 1521   PHURINE 6.0 08/24/2015 1842   PHURINE 6.0 01/20/2015 1521   GLUCOSEU NEGATIVE 08/24/2015 1842   GLUCOSEU Negative 01/20/2015 1521   HGBUR MODERATE* 08/24/2015 1842   HGBUR Negative 01/20/2015 1521   BILIRUBINUR NEGATIVE 08/24/2015 1842   BILIRUBINUR Negative 01/20/2015 1521   BILIRUBINUR NEG 06/12/2013 0937   KETONESUR NEGATIVE 08/24/2015 1842   KETONESUR Negative 01/20/2015 1521   PROTEINUR 100* 08/24/2015 1842   PROTEINUR < 30 01/20/2015 1521   PROTEINUR 30 06/12/2013 0937   UROBILINOGEN 0.2 02/04/2015 1030   UROBILINOGEN 0.2 01/20/2015 1521   UROBILINOGEN 0.2 06/12/2013 0937   NITRITE NEGATIVE 08/24/2015 1842   NITRITE Negative 01/20/2015 1521   NITRITE NEG 06/12/2013 0937   LEUKOCYTESUR MODERATE* 08/24/2015 1842   LEUKOCYTESUR Moderate 01/20/2015 1521   Sepsis Labs: @LABRCNTIP (procalcitonin:4,lacticidven:4) ) Recent Results (from the past 240 hour(s))  Culture, blood (Routine X 2)     Status: None   Collection Time: 08/16/15  4:13 PM  Result Value Ref Range Status   Specimen Description BLOOD LEFT FOREARM  Final   Special Requests BOTTLES DRAWN AEROBIC AND ANAEROBIC 5CC  Final   Culture   Final    NO GROWTH 5 DAYS Performed at [facility]    Report Status 08/21/2015 FINAL  Final  Culture, blood (Routine X 2)     Status: None   Collection Time: 08/16/15  4:14 PM  Result Value Ref Range Status   Specimen Description BLOOD RIGHT ANTECUBITAL  Final   Special Requests BOTTLES DRAWN AEROBIC AND ANAEROBIC 5CC  Final   Culture   Final    NO GROWTH 5 DAYS Performed at [facility]    Report Status 08/21/2015 FINAL  Final  C difficile quick scan w PCR reflex     Status: None   Collection Time: 08/16/15  4:53 PM  Result Value Ref Range Status   C Diff antigen NEGATIVE NEGATIVE Final   C Diff toxin NEGATIVE NEGATIVE Final   C Diff  interpretation Negative for toxigenic C. difficile  Final  MRSA PCR Screening     Status: None   Collection Time: 08/17/15 12:27 AM  Result Value Ref Range Status   MRSA by PCR NEGATIVE  NEGATIVE Final    Comment:        The GeneXpert MRSA Assay (FDA approved for NASAL specimens only), is one component of a comprehensive MRSA colonization surveillance program. It is not intended to diagnose MRSA infection nor to guide or monitor treatment for MRSA infections.   Culture, body fluid-bottle     Status: None   Collection Time: 08/17/15  4:01 PM  Result Value Ref Range Status   Specimen Description FLUID ABDOMEN PERITONEAL  Final   Special Requests BOTTLES DRAWN AEROBIC AND ANAEROBIC 10CC  Final   Culture   Final    NO GROWTH 5 DAYS Performed at [facility]    Report Status 08/22/2015 FINAL  Final  Gram stain     Status: None   Collection Time: 08/17/15  4:01 PM  Result Value Ref Range Status   Specimen Description FLUID ABDOMEN PERITONEAL  Final   Special Requests NONE  Final   Gram Stain   Final    FEW WBC PRESENT, PREDOMINANTLY MONONUCLEAR NO ORGANISMS SEEN Performed at [facility]    Report Status 08/17/2015 FINAL  Final  Urine culture     Status: Abnormal   Collection Time: 08/18/15  9:21 AM  Result Value Ref Range Status   Specimen Description URINE, RANDOM  Final   Special Requests NONE  Final   Culture (A)  Final    <10,000 COLONIES/mL INSIGNIFICANT GROWTH Performed at [facility]    Report Status 08/19/2015 FINAL  Final     Radiological Exams on Admission: Dg Chest Port 1 View  08/24/2015  CLINICAL DATA:  Worsening shortness of breath since yesterday. Hx of tachycardia, asthma, pneumonia. Gastric carcinoma. Lupus and Sjogren's disease. EXAM: PORTABLE CHEST 1 VIEW COMPARISON:  08/16/2015 FINDINGS: Low lung volumes are again demonstrated. Worsening diffuse bilateral pulmonary airspace disease is seen. No evidence of pneumothorax or pleural  effusion. Heart size remains within normal limits. Left-sided Port-A-Cath remains in appropriate position. IMPRESSION: Worsening bilateral diffuse airspace disease. Electronically Signed   By: Earle Gell M.D.   On: 08/24/2015 18:25    EKG: Independently reviewed.  Assessment/Plan Principal Problem:   ARDS (adult respiratory distress syndrome) (HCC) Active Problems:   Gastric cancer (HCC)   Protein-calorie malnutrition, severe (HCC)   Antineoplastic chemotherapy induced anemia   ARDS - Patient did recently receive doses of G-CSF in the past couple of days, this medication is know to carry ARDS as a risk / side effect.  Pulm consulted   They recommend adding solumedrol 60mg  IV Q6H   They will try and see patient in formal consult tonight  Due to DNI status, patient will be kept on NIPPV  Discussed severity of illness at length with family  Got lasix 40mg  IV x1 in ED  Got cefepime and vanc in ED, will continue this for now as I cant prove that this is non-infectious in nature at this time  Sputum and blood cultures  Gastric cancer with protein-calorie malnutrition -  Family with palliative care, patient was made a DNR/DNI just a couple of days ago per her Mother.  They specifically do not want intubation for the ARDS above, this was discussed at length including the life-threatening pulmonary failure that the patient has.   DVT prophylaxis: Lovenox Code Status: DNR/DNI confirmed with family at bedside Family Communication: Mother at bedside Consults called: Pulm Admission status: Admit to inpatient   Etta Quill DO [facility] Pager [phone] from 7PM-7AM  If 7AM-7PM, please  contact the day physician for the patient [email or web address] Password [facility]  08/24/2015, 9:27 PM

## 2015-08-24 NOTE — Consult Note (Signed)
PULMONARY / CRITICAL CARE MEDICINE   Name: Andrea Best MRN: [id number] DOB: [birth year]    ADMISSION DATE:  08/24/2015 CONSULTATION DATE:  08/24/2015  REFERRING MD:  Dr. Alcario Drought  CHIEF COMPLAINT:  Short of breath  HISTORY OF PRESENT ILLNESS:   45 yo female with progressive dyspnea over the past 24 hours.  She was hypoxic in ER and started on BiPAP.  She has hx of gastric cancer and has been getting chemotherapy in [place] with cancer treatment [facility] [place].  She was dx with cancer in 2015 and was being treated locally at first..  She also has hx of lupus.  Her chest xray showed b/l ASD.  She was started on antibiotics.  Hospitalist discussed goals of care with family >> initially made DNR, but family didn't fully understand conversation.  After further discussion decided for full code status.  She was in hospital from 5/22 to 5/28 with neutropenic fever, sepsis, diarrhea, pancytopenia, and severe protein calorie malnutrition.  She was tx with Abx, and granix.   PAST MEDICAL HISTORY :  She  has a past medical history of Eczema; Lupus ([place]); Sjogren's disease ([place]); Allergy; Tachycardia; Protein-calorie malnutrition, severe ([place]); Abnormal findings on esophagogastroduodenoscopy (EGD) (09/22/13); Asthma; GERD (gastroesophageal reflux disease); Anemia; Pneumonia; and Gastric cancer ([place]) (09/22/13 &10/10/13).  PAST SURGICAL HISTORY: She  has past surgical history that includes Cesarean section; laparotomy (N/A, 10/10/2013); Portacath placement (Right, 11/20/2013); and Cystoscopy w/ ureteral stent placement (Right, 02/01/2015).  Allergies  Allergen Reactions  . Compazine [Prochlorperazine Maleate] Other (See Comments)    Dystonia  . Azithromycin Rash  . Sulfa Antibiotics Rash    hallucinations  . Hydrocodone-Acetaminophen Nausea And Vomiting    Hallucinating   . 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 SHE DOES NOT WANT ANY BLOOD transfusions but will accept Albumin,     Current  Facility-Administered Medications on File Prior to Encounter  Medication  . sodium chloride flush (NS) 0.9 % injection 10 mL   Current Outpatient Prescriptions on File Prior to Encounter  Medication Sig  . Cholecalciferol (VITAMIN D-3 PO) Take 5 mLs by mouth daily.  . diphenoxylate-atropine (LOMOTIL) 2.5-0.025 MG tablet Take 1 tablet by mouth every 4 (four) hours as needed. diarrhea  . dronabinol (MARINOL) 5 MG capsule Take 5 mg by mouth every 12 (twelve) hours as needed (weight loss). n/v  . morphine (ROXANOL) 20 MG/ML concentrated solution Take 1/2 ml to 50m every 4 hours as need for pain  . ondansetron (ZOFRAN) 4 MG tablet Take 1 tablet (4 mg total) by mouth every 8 (eight) hours as needed for nausea or vomiting.  . potassium chloride SA (K-DUR,KLOR-CON) 20 MEQ tablet Take 1 tablet (20 mEq total) by mouth daily.  . DULoxetine (CYMBALTA) 30 MG capsule Take 30 mg by mouth daily.  . ferrous sulfate 325 (65 FE) MG tablet Take 325 mg by mouth 3 (three) times a week.   . hydroxychloroquine (PLAQUENIL) 200 MG tablet Take 1 tablet (200 mg total) by mouth 2 (two) times daily. (Patient taking differently: Take 400 mg by mouth at bedtime. )    FAMILY HISTORY:  Her indicated that her mother is alive. She indicated that her father is alive. She indicated that her sister is alive. She indicated that her brother is alive. She indicated that her maternal grandmother is deceased. She indicated that her maternal grandfather is deceased. She indicated that her maternal aunt is alive. She indicated that only one of her two maternal  uncles is alive. She indicated that her other is deceased.   SOCIAL HISTORY: She  reports that she has never smoked. She has never used smokeless tobacco. She reports that she does not drink alcohol or use illicit drugs.  REVIEW OF SYSTEMS:   Negative except above  SUBJECTIVE:   VITAL SIGNS: BP 107/78 mmHg  Pulse 114  Temp(Src) 97.5 F (36.4 C) (Axillary)  Resp 25  SpO2  100%  HEMODYNAMICS:    VENTILATOR SETTINGS: Vent Mode:  [-] BIPAP FiO2 (%):  [50 %] 50 % Set Rate:  [8 bmp] 8 bmp PEEP:  [5 cmH20] 5 cmH20  INTAKE / OUTPUT:    PHYSICAL EXAMINATION: General:  Cachetic Neuro:  Sleepy, wakes up easily, follows commands, moves all extremities HEENT:  BiPAP mask on Cardiovascular:  Regular, tachycardic, port Rt upper chest Lungs:  B/l crackles Abdomen:  Mild distention, soft, non tender Musculoskeletal:  Decreased muscle bulk Skin: no rashes  LABS:  BMET  Recent Labs Lab 08/18/15 0435 08/21/15 0900 08/24/15 1742  NA 135 135 140  K 3.5 2.4* 3.2*  CL 110 113* 117*  CO2 21* 18* 19*  BUN 6 7 17   CREATININE 0.54 0.87 0.85  GLUCOSE 97 91 120*    Electrolytes  Recent Labs Lab 08/18/15 0435 08/21/15 0900 08/24/15 1742  CALCIUM 7.4* 8.2* 9.1    CBC  Recent Labs Lab 08/19/15 0420 08/21/15 0900 08/24/15 1742  WBC 4.5 13.1* 36.9*  HGB 7.2* 7.2* 7.3*  HCT 22.6* 21.9* 22.0*  PLT 189 149* 124*    Coag's No results for input(s): APTT, INR in the last 168 hours.  Sepsis Markers  Recent Labs Lab 08/24/15 1752  LATICACIDVEN 1.64    ABG  Recent Labs Lab 08/24/15 1752  PHART 7.307*  PCO2ART 37.8  PO2ART 248*    Liver Enzymes  Recent Labs Lab 08/18/15 0435 08/21/15 0900 08/24/15 1742  AST 11* 13* 28  ALT 7* 9* 13*  ALKPHOS 53 76 115  BILITOT 0.6 1.0 0.9  ALBUMIN 1.4* 1.3* 1.7*    Cardiac Enzymes  Recent Labs Lab 08/24/15 1742  TROPONINI <0.03    Glucose No results for input(s): GLUCAP in the last 168 hours.  Imaging Dg Chest Port 1 View  08/24/2015  CLINICAL DATA:  Worsening shortness of breath since yesterday. Hx of tachycardia, asthma, pneumonia. Gastric carcinoma. Lupus and Sjogren's disease. EXAM: PORTABLE CHEST 1 VIEW COMPARISON:  08/16/2015 FINDINGS: Low lung volumes are again demonstrated. Worsening diffuse bilateral pulmonary airspace disease is seen. No evidence of pneumothorax or pleural  effusion. Heart size remains within normal limits. Left-sided Port-A-Cath remains in appropriate position. IMPRESSION: Worsening bilateral diffuse airspace disease. Electronically Signed   By: Earle Gell M.D.   On: 08/24/2015 18:25     STUDIES:   CULTURES: 5/30 Blood >> 5/30 Urine >> 5/30 Sputum >> 5/30 Legionella Ag >> 5/30 Pneumococcal Ag >>  ANTIBIOTICS: 5/30 Vancomycin >>  5/30 Zosyn >>   SIGNIFICANT EVENTS: 5/30 Admit  LINES/TUBES: Rt port >>  DISCUSSION: 45 yo female with Stage IV gastric cancer, and recent admit for febrile neutropenia now presents with acute hypoxic respiratory failure and b/l pulmonary infiltrates.  Concern is for HCAP versus reaction to filgastrim.  Another possibility, but less likely, is pneumonitis related to hx of lupus and Sjogren's.  ASSESSMENT / PLAN:  PULMONARY A: Acute hypoxic respiratory failure. P:   Bipap as tolerated >> might need intubation Solumedrol 60 mg q6h Check procalcitonin, BNP, ESR, ANA F/u CXR  CARDIOVASCULAR A:  Sinus tachycardia in setting of respiratory failure. P:  Monitor hemodynamics Continue IV fluids  RENAL A:   Hypokalemia. P:   F/u and replace electrolytes as needed  GASTROINTESTINAL A:   Severe protein calorie malnutrition. P:   NPO Protonix for SUP  HEMATOLOGIC A:   Stage IV gastric cancer. Anemia of chronic disease and critical illness. Chemotherapy induced neutropenia s/p tx with filgastrim. P:  F/u CBC Lovenox of DVT prevention  INFECTIOUS A:   Possible HCAP. P:   Day 1 vancomycin, cefepime  ENDOCRINE A:   No acute issues.   P:   F/u blood sugar on BMET  NEUROLOGIC A:   Cancer pain. P:   Prn morphine, cymbalta  RHEUMATOLOGY A: Hx of lupus, Sjogren's syndrome. P: Continue plaquenil  Goals of Care >> had very extensive d/w pt's family at bedside.  They are in agreement to allow short term intubation if BiPAP doesn't work.  They are not sure what to decide about  long term vent support if needed.  Pt's mother expressed that she would not want to have her undergo CPR in event of cardiac arrest >> other family members request more time to think this over.  For now she is full code.  Will continue to d/w family based on her progress, and consult palliative care to assist with conversation.  D/w Dr. Alcario Drought.  Will change to PCCM service.  CC time 73 minutes.  Chesley Mires, MD [facility] Pulmonary/Critical Care 08/24/2015, 11:07 PM Pager:  [phone] After 3pm call: [phone]

## 2015-08-24 NOTE — ED Notes (Signed)
20 Timer starter - 10:15

## 2015-08-24 NOTE — ED Provider Notes (Signed)
CSN: [id number]     Arrival date & time 08/24/15  1708 History   First MD Initiated Contact with Patient 08/24/15 1714     Chief Complaint  Patient presents with  . Shortness of Breath     (Consider location/radiation/quality/duration/timing/severity/associated sxs/prior Treatment) HPI Patient presents to the emergency department with increasing shortness of breath since last night.  The patient's family gives me the history that the patient became short of breath last night and they purchased a pulse oximetry finger device that showed that her pulse ox was low.  They called EMS today after the patient seemed be getting worse.  Patient not able to give me much history due to her shortness of breath and severe illness the patient is currently being treated for stage IV gastric cancer.  Patient last received chemotherapy one month ago.  The family states that she was recently in the hospital for sepsis Past Medical History  Diagnosis Date  . Eczema   . Lupus (HCC)     joint pain and extreme fatique - improved after plaquenil and prednisone therapy  . Sjogren's disease ([place])     caused pt to loose her teeth due to dry mouth;  prone to eye irritation and infection due to dry eye; affects immune system - PRONE TO INFECTIONS   . Allergy   . Tachycardia   . Protein-calorie malnutrition, severe ([place])   . Abnormal findings on esophagogastroduodenoscopy (EGD) 09/22/13    esophagitis  . Asthma     no recent flare ups - no inhalers - as of 11/19/13  . GERD (gastroesophageal reflux disease)   . Anemia     iron deficiency   . Pneumonia     health care -associated July 2015 post op gastrectomy surgery  . Gastric cancer ([place]) 09/22/13 &10/10/13    adenocarcinoma/metastatic  FIRST CHEMO WAS 11/14/13 - NEXT CHEMO TREATMENT IS 11/21/13.   Past Surgical History  Procedure Laterality Date  . Cesarean section      1997  . Laparotomy N/A 10/10/2013    Procedure: SUBTOTAL GASTRECTOMY WITH GASTRIC  JEJUNOSOTMY;  Surgeon: Edward Jolly, MD;  Location: [facility];  Service: General;  Laterality: N/A;  . Portacath placement Right 11/20/2013    Procedure: INSERTION PORT-A-CATH with ULTRA SOUND;  Surgeon: Adin Hector, MD;  Location: [facility];  Service: General;  Laterality: Right;  . Cystoscopy w/ ureteral stent placement Right 02/01/2015    Procedure: CYSTOSCOPY WITH RETROGRADE PYELOGRAM/URETERAL STENT PLACEMENT;  Surgeon: Festus Aloe, MD;  Location: [facility];  Service: Urology;  Laterality: Right;   Family History  Problem Relation Age of Onset  . Hypertension Mother   . Diabetes Mother   . Diabetes Maternal Grandmother   . Diabetes Maternal Grandfather   . HIV/AIDS Maternal Uncle   . Stomach cancer Other     MGMs sister   Social History  Substance Use Topics  . Smoking status: Never Smoker   . Smokeless tobacco: Never Used  . Alcohol Use: No     Comment: very occasional- none since feb 2015 when pt got sick   OB History    Gravida Para Term Preterm AB TAB SAB Ectopic Multiple Living   1 1  1             Obstetric Comments   27 weeks, NICU stay. C-section secondary to Pre-E     Review of Systems Level V caveat applies due to severe illness   Allergies  Compazine; Azithromycin; Sulfa antibiotics; Hydrocodone-acetaminophen;  and Other  Home Medications   Prior to Admission medications   Medication Sig Start Date End Date Taking? Authorizing Provider  Cholecalciferol (VITAMIN D-3 PO) Take 5 mLs by mouth daily.   Yes Historical Provider, MD  diphenoxylate-atropine (LOMOTIL) 2.5-0.025 MG tablet Take 1 tablet by mouth every 4 (four) hours as needed. diarrhea 08/13/15  Yes Historical Provider, MD  dronabinol (MARINOL) 5 MG capsule Take 5 mg by mouth every 12 (twelve) hours as needed (weight loss). n/v 06/29/15  Yes Historical Provider, MD  morphine (ROXANOL) 20 MG/ML concentrated solution Take 1/2 ml to 73ml every 4 hours as need for pain 04/21/15  Yes Owens Shark, NP   Multiple Vitamins-Minerals (MULTIVITAMIN & MINERAL PO) Take 1 tablet by mouth daily.   Yes Historical Provider, MD  ondansetron (ZOFRAN) 4 MG tablet Take 1 tablet (4 mg total) by mouth every 8 (eight) hours as needed for nausea or vomiting. 12/02/14  Yes Owens Shark, NP  potassium chloride SA (K-DUR,KLOR-CON) 20 MEQ tablet Take 1 tablet (20 mEq total) by mouth daily. 03/07/15  Yes Orpah Greek, MD  dexamethasone (DECADRON) 2 MG tablet Take 5 tablets (10 mg) PO at 10PM the night before chemo and 5 tablets at 6AM the morning of first chemo. Patient not taking: Reported on 08/16/2015 05/19/15   Ladell Pier, MD  DULoxetine (CYMBALTA) 30 MG capsule Take 30 mg by mouth daily. 07/08/15   Historical Provider, MD  ferrous sulfate 325 (65 FE) MG tablet Take 325 mg by mouth 3 (three) times a week.     Historical Provider, MD  hydroxychloroquine (PLAQUENIL) 200 MG tablet Take 1 tablet (200 mg total) by mouth 2 (two) times daily. Patient taking differently: Take 400 mg by mouth at bedtime.  08/22/15   Charlynne Cousins, MD  levofloxacin (LEVAQUIN) 500 MG tablet Take 1 tablet (500 mg total) by mouth daily. Patient not taking: Reported on 08/24/2015 08/22/15   Charlynne Cousins, MD   BP 112/77 mmHg  Pulse 113  Temp(Src) 97.5 F (36.4 C) (Axillary)  Resp 28  SpO2 100% Physical Exam  Constitutional: She appears lethargic. She appears cachectic. She has a sickly appearance. She appears ill. She appears distressed. Face mask in place.  HENT:  Head: Normocephalic and atraumatic.  Eyes: Pupils are equal, round, and reactive to light.  Neck: Normal range of motion. Neck supple.  Cardiovascular: Regular rhythm and normal heart sounds.  Tachycardia present.   Pulmonary/Chest: Tachypnea noted. She is in respiratory distress. She has decreased breath sounds. She has no wheezes. She has no rhonchi. She has no rales.  Abdominal: Soft. Bowel sounds are normal. She exhibits no distension. There is no  tenderness.  Neurological: She appears lethargic. She exhibits normal muscle tone. Coordination normal.  Skin: Skin is warm and dry. No rash noted. No erythema.  Nursing note and vitals reviewed.   ED Course  Procedures (including critical care time) Labs Review Labs Reviewed  CBC WITH DIFFERENTIAL/PLATELET - Abnormal; Notable for the following:    WBC 36.9 (*)    RBC 2.52 (*)    Hemoglobin 7.3 (*)    HCT 22.0 (*)    RDW 26.4 (*)    Platelets 124 (*)    Neutro Abs 33.6 (*)    Monocytes Absolute 1.8 (*)    All other components within normal limits  COMPREHENSIVE METABOLIC PANEL - Abnormal; Notable for the following:    Potassium 3.2 (*)    Chloride 117 (*)  CO2 19 (*)    Glucose, Bld 120 (*)    Total Protein 5.1 (*)    Albumin 1.7 (*)    ALT 13 (*)    Anion gap 4 (*)    All other components within normal limits  URINALYSIS, ROUTINE W REFLEX MICROSCOPIC (NOT AT [facility]) - Abnormal; Notable for the following:    APPearance CLOUDY (*)    Hgb urine dipstick MODERATE (*)    Protein, ur 100 (*)    Leukocytes, UA MODERATE (*)    All other components within normal limits  BLOOD GAS, ARTERIAL - Abnormal; Notable for the following:    pH, Arterial 7.307 (*)    pO2, Arterial 248 (*)    Bicarbonate 18.3 (*)    Acid-base deficit 6.8 (*)    All other components within normal limits  URINE MICROSCOPIC-ADD ON - Abnormal; Notable for the following:    Squamous Epithelial / LPF 0-5 (*)    Bacteria, UA FEW (*)    Casts HYALINE CASTS (*)    All other components within normal limits  CULTURE, BLOOD (ROUTINE X 2)  CULTURE, BLOOD (ROUTINE X 2)  URINE CULTURE  TROPONIN I  I-STAT CG4 LACTIC ACID, ED    Imaging Review Dg Chest Port 1 View  08/24/2015  CLINICAL DATA:  Worsening shortness of breath since yesterday. Hx of tachycardia, asthma, pneumonia. Gastric carcinoma. Lupus and Sjogren's disease. EXAM: PORTABLE CHEST 1 VIEW COMPARISON:  08/16/2015 FINDINGS: Low lung volumes are again  demonstrated. Worsening diffuse bilateral pulmonary airspace disease is seen. No evidence of pneumothorax or pleural effusion. Heart size remains within normal limits. Left-sided Port-A-Cath remains in appropriate position. IMPRESSION: Worsening bilateral diffuse airspace disease. Electronically Signed   By: Earle Gell M.D.   On: 08/24/2015 18:25   I have personally reviewed and evaluated these images and lab results as part of my medical decision-making.  CRITICAL CARE Performed by: [facility] Total critical care time: 45 minutes Critical care time was exclusive of separately billable procedures and treating other patients. Critical care was necessary to treat or prevent imminent or life-threatening deterioration. Critical care was time spent personally by me on the following activities: development of treatment plan with patient and/or surrogate as well as nursing, discussions with consultants, evaluation of patient's response to treatment, examination of patient, obtaining history from patient or surrogate, ordering and performing treatments and interventions, ordering and review of laboratory studies, ordering and review of radiographic studies, pulse oximetry and re-evaluation of patient's condition.  Patient was placed on BiPAP.  She is also very ill-appearing and I feel that this is multifactorial and related mostly to her cancer.  Family states that she does want everything done in case of any cardiopulmonary arrest.  Dalia Heading, PA-C 08/24/15 [id number]  Charlesetta Shanks, MD 09/01/15 [phone]

## 2015-08-24 NOTE — ED Notes (Signed)
PA at bedside. Port being accessed.

## 2015-08-24 NOTE — Progress Notes (Signed)
Patient noted to have been discharged from the hospital from 05/22 to 05/28 for Sepsis.  Patient with pmhx of Gastric Cancer.  Patient lives at home with her mother.  Last admission, patient's mother had a discussion with Palliative care team member.  Patient's mother reports she has filled out a MOST form but it is at home and she will bring it in ASAP.  Patient does not wear oxygen at home.  Per chart review patient has desat to 40% on room air.  Pcp listed as Dr. Radene Ou.  Patient's mother reports that the patient has a very positive outlook on her illness and believes she will be willing to do whatever she needs to do to get better.  Patient's mother reports patient did not even want to come to the hospital today.  Patient currently on Bipap.  Patient's mother requesting assistance with patient at home.  EDCM offered patient's mother a list of home health agencies in [facility].  Patient's mother reports [facility] has come out to see patient in the past.  No further EDCM needs at this time.

## 2015-08-25 ENCOUNTER — Inpatient Hospital Stay (HOSPITAL_COMMUNITY): Payer: BLUE CROSS/BLUE SHIELD

## 2015-08-25 DIAGNOSIS — Z515 Encounter for palliative care: Secondary | ICD-10-CM | POA: Insufficient documentation

## 2015-08-25 DIAGNOSIS — J9601 Acute respiratory failure with hypoxia: Secondary | ICD-10-CM

## 2015-08-25 DIAGNOSIS — Z7189 Other specified counseling: Secondary | ICD-10-CM

## 2015-08-25 LAB — COMPREHENSIVE METABOLIC PANEL
ALBUMIN: 1.6 g/dL — AB (ref 3.5–5.0)
ALT: 13 U/L — ABNORMAL LOW (ref 14–54)
ANION GAP: 3 — AB (ref 5–15)
AST: 23 U/L (ref 15–41)
Alkaline Phosphatase: 105 U/L (ref 38–126)
BUN: 16 mg/dL (ref 6–20)
CHLORIDE: 116 mmol/L — AB (ref 101–111)
CO2: 22 mmol/L (ref 22–32)
Calcium: 9 mg/dL (ref 8.9–10.3)
Creatinine, Ser: 1.01 mg/dL — ABNORMAL HIGH (ref 0.44–1.00)
GFR calc Af Amer: >60 mL/min (ref >60)
Glucose, Bld: 138 mg/dL — ABNORMAL HIGH (ref 65–99)
POTASSIUM: 3.1 mmol/L — AB (ref 3.5–5.1)
Sodium: 141 mmol/L (ref 135–145)
Total Bilirubin: 0.6 mg/dL (ref 0.3–1.2)
Total Protein: 4.9 g/dL — ABNORMAL LOW (ref 6.5–8.1)

## 2015-08-25 LAB — STREP PNEUMONIAE URINARY ANTIGEN: Strep Pneumo Urinary Antigen: NEGATIVE

## 2015-08-25 LAB — SEDIMENTATION RATE: Sed Rate: 55 mm/hr — ABNORMAL HIGH (ref 0–22)

## 2015-08-25 LAB — CBC
HEMATOCRIT: 22.7 % — AB (ref 36.0–46.0)
HEMOGLOBIN: 7.4 g/dL — AB (ref 12.0–15.0)
MCH: 28.7 pg (ref 26.0–34.0)
MCHC: 32.6 g/dL (ref 30.0–36.0)
MCV: 88 fL (ref 78.0–100.0)
Platelets: 114 10*3/uL — ABNORMAL LOW (ref 150–400)
RBC: 2.58 MIL/uL — ABNORMAL LOW (ref 3.87–5.11)
RDW: 26 % — AB (ref 11.5–15.5)
WBC: 28.5 10*3/uL — AB (ref 4.0–10.5)

## 2015-08-25 LAB — GLUCOSE, CAPILLARY
GLUCOSE-CAPILLARY: 124 mg/dL — AB (ref 65–99)
GLUCOSE-CAPILLARY: 116 mg/dL — AB (ref 65–99)
GLUCOSE-CAPILLARY: 120 mg/dL — AB (ref 65–99)
GLUCOSE-CAPILLARY: 116 mg/dL — AB (ref 65–99)
GLUCOSE-CAPILLARY: 118 mg/dL — AB (ref 65–99)
Glucose-Capillary: 123 mg/dL — ABNORMAL HIGH (ref 65–99)

## 2015-08-25 LAB — PHOSPHORUS: PHOSPHORUS: 2.5 mg/dL (ref 2.5–4.6)

## 2015-08-25 LAB — PROCALCITONIN
PROCALCITONIN: 0.97 ng/mL
Procalcitonin: 0.96 ng/mL

## 2015-08-25 LAB — BRAIN NATRIURETIC PEPTIDE: B NATRIURETIC PEPTIDE 5: 324.7 pg/mL — AB (ref 0.0–100.0)

## 2015-08-25 LAB — MAGNESIUM: Magnesium: 1.6 mg/dL — ABNORMAL LOW (ref 1.7–2.4)

## 2015-08-25 MED ORDER — BOOST / RESOURCE BREEZE PO LIQD
1.0000 | Freq: Three times a day (TID) | ORAL | Status: DC
  Administered 2015-08-26 – 2015-08-28 (×8): 1 via ORAL

## 2015-08-25 MED ORDER — MAGNESIUM SULFATE 2 GM/50ML IV SOLN
2.0000 g | Freq: Once | INTRAVENOUS | Status: AC
  Administered 2015-08-25: 2 g via INTRAVENOUS
  Filled 2015-08-25: qty 50

## 2015-08-25 MED ORDER — POTASSIUM CHLORIDE 10 MEQ/50ML IV SOLN
10.0000 meq | INTRAVENOUS | Status: AC
Start: 2015-08-25 — End: 2015-08-25
  Administered 2015-08-25 (×5): 10 meq via INTRAVENOUS
  Filled 2015-08-25 (×5): qty 50

## 2015-08-25 MED ORDER — INSULIN ASPART 100 UNIT/ML ~~LOC~~ SOLN
0.0000 [IU] | SUBCUTANEOUS | Status: DC
  Administered 2015-08-25 – 2015-08-27 (×10): 1 [IU] via SUBCUTANEOUS

## 2015-08-25 NOTE — Progress Notes (Signed)
Pt remains off BiPAP- PT states she is breathing fine at this time.

## 2015-08-25 NOTE — Progress Notes (Signed)
RT offered BiPAP and PT states she is breathing fine. Discussed with PT and caregiver the plan to offer Q4- caregiver states that PT desires to try eating yogurt before offering BiPAP again.

## 2015-08-25 NOTE — Progress Notes (Signed)
Initial Nutrition Assessment  DOCUMENTATION CODES:   Severe malnutrition in context of chronic illness  INTERVENTION:  - Will order Boost Breeze po TID, each supplement provides 250 kcal and 9 grams of protein. - Encourage PO intakes of meals and supplements. - RD will continue to monitor for additional needs.  NUTRITION DIAGNOSIS:   Increased nutrient needs related to catabolic illness, cancer and cancer related treatments as evidenced by estimated needs.   GOAL:   Patient will meet greater than or equal to 90% of their needs  MONITOR:   PO intake, Supplement acceptance, Weight trends, Labs, I & O's  REASON FOR ASSESSMENT:   Malnutrition Screening Tool  ASSESSMENT:   45 y.o. female with medical history significant of metastatic gastric cancer, severe malnutrition, lupus, sjogren's, recent admission on the 22nd to 28th for febrile neutropenia treated with broad spectrum ABx and Granix. Today patient became severely short of breath, symptoms onset last night and rapidly progressed throughout the day. Family purchased a pulse ox device which showed that her pulse ox was low. Initially patient was resistant to coming to hospital, but family called EMS due to patient continuing to get worse and becoming so sick that she couldn't really say no.  Pt seen for MST. BMI indicates normal weight. Pt NPO this AM with diet advancement to Regular at 1145. Pt very weak this AM and the majority of information was provided by pt's mother, who is at bedside. This RD is familiar with pt from previous admissions, including admission earlier this month.  Since d/c, mom had been providing pt with very small meals every 2 hours. Yesterday pt had oatmeal mixed with applesauce and protein powder, 1/2 bottle Muscle Milk mixed with protein powder, and another small meal that mom cannot recall at this time. Pt does best with liquids or pureed items rather than solid foods. Mom reports she would provide foods  to pt and when pt stated that she was full mom would not push her to eat any more.  Pt was provided with Boost Breeze during last admission and liked both peach and wild berry varieties of this supplement. Pt interested in receiving this supplement this admission.   Physical assessment indicates severe muscle and fat wasting to all areas. Per chart review, pt gained 13 lbs from 05/05/15-06/03/15 and then subsequently lost 20 lbs (14% body weight) from 06/03/15-08/18/15; this is significant for time frame.  Pt not meeting needs. Will continue to monitor POC/GOC with Palliative following. Medications reviewed; Marinol, 2 grams IV magnesium sulfate x1 dose today. IVF: D5-1/2 NS-20 mEq KCl @ 75 mL/hr (306 kcal). Labs reviewed; CBGs: 116-124 mg/dL today, K: 3.1 mmol/L, Cl: 116 mmol/L, Mg: 1.6 mg/dL.      Diet Order:  Diet regular Room service appropriate?: Yes; Fluid consistency:: Thin  Skin:  Wound (see comment) (L abdominal wound)  Last BM:  PTA  Height:   Ht Readings from Last 1 Encounters:  08/18/15 5\' 5"  (1.651 m)    Weight:   Wt Readings from Last 1 Encounters:  08/18/15 119 lb 7.8 oz (54.2 kg)    Ideal Body Weight:  56.82 kg (kg)  BMI:  19.85 kg/m2  Estimated Nutritional Needs:   Kcal:  1625-1900 (30-35 kcal/kg)  Protein:  75-85 grams  Fluid:  >/= 1.6 L/day  EDUCATION NEEDS:   No education needs identified at this time     Jarome Matin, RD, LDN Inpatient Clinical Dietitian Pager # [phone] After hours/weekend pager # [phone]

## 2015-08-25 NOTE — Care Management Note (Signed)
Case Management Note  Patient Details  Name: CHASTELYN RENNARD MRN: [id number] Date of Birth: [birth year]  Subjective/Objective:      Shortness of breath and on bipap              Action/Plan:Date:  Aug 25, 2015 Chart reviewed for concurrent status and case management needs. Will continue to follow the patient for changes and needs: Expected discharge date: [id number] Velva Harman, BSN, [place], [place]   Expected Discharge Date:   (unknown)               Expected Discharge Plan:  [place]  In-House Referral:  Clinical Social Work  Discharge planning Services  CM Consult  Post Acute Care Choice:  NA Choice offered to:  NA  DME Arranged:  N/A DME Agency:  NA  HH Arranged:  NA HH Agency:  NA  Status of Service:  In process, will continue to follow  Medicare Important Message Given:    Date Medicare IM Given:    Medicare IM give by:    Date Additional Medicare IM Given:    Additional Medicare Important Message give by:     If discussed at [place] of Stay Meetings, dates discussed:    Additional Comments:  Leeroy Cha, RN 08/25/2015, 9:30 AM

## 2015-08-25 NOTE — Progress Notes (Signed)
[id number] Matia Zelada,BSN,RN3,CCM:  List of private duty nursing agencies given to the family of the patient.

## 2015-08-25 NOTE — Progress Notes (Signed)
Removed PT from BiPAP(PT requesting to eat)  and placed on 2lpm [place]- tolerating well at this time. RN aware.

## 2015-08-25 NOTE — Progress Notes (Signed)
RT removed PT from BiPAP and placed on 2 lpm [place]- tolerating well at this time. RN aware.

## 2015-08-25 NOTE — Progress Notes (Signed)
[facility] ADULT ICU REPLACEMENT PROTOCOL FOR AM LAB REPLACEMENT ONLY  The patient does apply for the [facility] Adult ICU Electrolyte Replacment Protocol based on the criteria listed below:   1. Is GFR >/= 40 ml/min? Yes.    Patient's GFR today is >60 2. Is urine output >/= 0.5 ml/kg/hr for the last 6 hours? Yes.   Patient's UOP is 4.15 ml/kg/hr 3. Is BUN < 60 mg/dL? Yes.    Patient's BUN today is 18 4. Abnormal electrolyte(s): Mg - 1.6 5. Ordered repletion with: Per Protocol 6. If a panic level lab has been reported, has the CCM MD in charge been notified? Yes.  .   Physician:  Dr. Vaughan Browner MD also notified of K - 3.1 that does not meet protocol  Orland Penman 08/25/2015 6:26 AM

## 2015-08-25 NOTE — Progress Notes (Signed)
RT placed PT back on BiPAP at approx. 1345- tolerating well at this time. RN aware.

## 2015-08-25 NOTE — Consult Note (Signed)
Consultation Note Date: 08/25/2015   Patient Name: AVERLEY WYCOFF  DOB: Oct 28, 1970  MRN: [id number]  Age / Sex: 45 y.o., female  PCP: Aretta Nip, MD Referring Physician: Chesley Mires, MD  Reason for Consultation: Establishing goals of care  HPI/Patient Profile: 45 y.o. female  with past medical history of gastric cancer  admitted on 08/24/2015 with respiratory failure, acute hypoxic .   Clinical Assessment and Goals of Care:  45 year old lady with a past medical history significant for gastric cancer diagnosed in 2015. Patient underwent surgery, partial gastrectomy. She was hospitalized earlier this month for febrile neutropenia. She was given broad-spectrum antibiotics and Granix. In the previous hospitalization, specifically on 5-26/17, she was seen and evaluated by palliative care. CODE STATUS was elected as continuation of full code. Patient is undergoing treatment at cancer treatment centers of [place]. It is noted that she possibly had chemotherapy-induced neutropenia earlier this month.  Patient became acutely hypoxic and short of breath at home and was brought to the hospital. She is now admitted to the intensive care unit for acute hypoxic respiratory failure deemed to secondary to possible healthcare associated pneumonia versus a component of her underlying lupus versus possibly a side effect of her developing adult respiratory distress syndrome from recent use of G-CSF. She has been placed on steroids and BiPAP. Palliative care consultation for CODE STATUS and goals of care discussions in light of underlying history of metastatic gastric cancer and hospitalization for acute critical illness of acute hypoxic respiratory failure.  Patient is a young cachectic lady resting in bed. She is in mild-moderate restlessness/discomfort. She is attempting to take her mittens off. She appears awake but is  not entirely alert. She is on BiPAP. Patient's mother is present at the bedside. She recalls meeting Dr. Alen Blew from palliative service a few days ago. She states that she recognizes that her daughter is seriously ill. She states that she is "enduring". She remains hopeful for stabilization/recovery. As has been discussed earlier with pulmonary/critical-care medicine service, patient's mother wishes to continue with full CODE STATUS. She is accepting often agreeable to intubation and mechanical ventilation. Discussed the serious nature of the patient's life limiting illness and the fact that she had been on palliative chemotherapy recently with serious side effects.  Thank you for the consult. We will continue to follow along and help guide appropriate decision-making. Offered supportive care therapeutic and active listening with the patient's next of kin, mother who has significant caregiver to stress/burnout.  NEXT OF KIN  mother   SUMMARY OF RECOMMENDATIONS:  Remains FULL CODE, mother accepting of intubation and mechanical ventilation if indicated.  Palliative care will continue to follow along to help guide decision making based on disease trajectory.  Limited prognosis, non curable nature of patient's illness discussed with patient's mother.     Code Status/Advance Care Planning:  Full code    Symptom Management:     Continue current treatment.  Palliative Prophylaxis:   Delirium Protocol  Additional Recommendations (Limitations, Scope, Preferences):  Full  Scope Treatment  Psycho-social/Spiritual:   Desire for further Chaplaincy support:yes  Additional Recommendations: Caregiving  Support/Resources  Prognosis:   Unable to determine but guarded due to acute critical illness, gastric cancer, resp failure.   Discharge Planning: To Be Determined, but patient acutely critically ill, hospital death remains a very likely probability.       Primary Diagnoses: Present  on Admission:  . ARDS (adult respiratory distress syndrome) ([place]) . Gastric cancer ([place]) . Protein-calorie malnutrition, severe ([place]) . Antineoplastic chemotherapy induced anemia . Acute respiratory failure with hypoxia ([place]) . Acute respiratory failure ([place])  I have reviewed the medical record, interviewed the patient and family, and examined the patient. The following aspects are pertinent.  Past Medical History  Diagnosis Date  . Eczema   . Lupus (H[facility])     joint pain and extreme fatique - improved after plaquenil and prednisone therapy  . Sjogren's disease ([place])     caused pt to loose her teeth due to dry mouth;  prone to eye irritation and infection due to dry eye; affects immune system - PRONE TO INFECTIONS   . Allergy   . Tachycardia   . Protein-calorie malnutrition, severe ([place])   . Abnormal findings on esophagogastroduodenoscopy (EGD) 09/22/13    esophagitis  . Asthma     no recent flare ups - no inhalers - as of 11/19/13  . GERD (gastroesophageal reflux disease)   . Anemia     iron deficiency   . Pneumonia     health care -associated July 2015 post op gastrectomy surgery  . Gastric cancer ([place]) 09/22/13 &10/10/13    adenocarcinoma/metastatic  FIRST CHEMO WAS 11/14/13 - NEXT CHEMO TREATMENT IS 11/21/13.   Social History   Social History  . Marital Status: Single    Spouse Name: N/A  . Number of Children: 1  . Years of Education: College   Occupational History  .     Social History Main Topics  . Smoking status: Never Smoker   . Smokeless tobacco: Never Used  . Alcohol Use: No     Comment: very occasional- none since feb 2015 when pt got sick  . Drug Use: No  . Sexual Activity: Not Asked   Other Topics Concern  . None   Social History Narrative   Single. Lives with daughter (college age)  Lonn [place], and patient's mother.    Work at [employer] as a Research scientist (physical sciences).   Owns her own car. Always wears seatbelt.    Family History  Problem Relation Age of  Onset  . Hypertension Mother   . Diabetes Mother   . Diabetes Maternal Grandmother   . Diabetes Maternal Grandfather   . HIV/AIDS Maternal Uncle   . Stomach cancer Other     MGMs sister   Scheduled Meds: . ceFEPime (MAXIPIME) IV  1 g Intravenous Q8H  . DULoxetine  30 mg Oral Daily  . enoxaparin (LOVENOX) injection  40 mg Subcutaneous Q24H  . hydroxychloroquine  400 mg Oral QHS  . insulin aspart  0-9 Units Subcutaneous Q4H  . methylPREDNISolone (SOLU-MEDROL) injection  60 mg Intravenous Q6H  . potassium chloride  10 mEq Intravenous Q1 Hr x 5  . vancomycin  750 mg Intravenous Q12H   Continuous Infusions: . dextrose 5 % and 0.45 % NaCl with KCl 20 mEq/L 75 mL/hr at 08/24/15 2354   PRN Meds:.diphenoxylate-atropine, dronabinol, morphine injection, morphine CONCENTRATE, ondansetron (ZOFRAN) IV Medications Prior to Admission:  Prior to Admission medications   Medication  Sig Start Date End Date Taking? Authorizing Provider  Cholecalciferol (VITAMIN D-3 PO) Take 5 mLs by mouth daily.   Yes Historical Provider, MD  diphenoxylate-atropine (LOMOTIL) 2.5-0.025 MG tablet Take 1 tablet by mouth every 4 (four) hours as needed. diarrhea 08/13/15  Yes Historical Provider, MD  dronabinol (MARINOL) 5 MG capsule Take 5 mg by mouth every 12 (twelve) hours as needed (weight loss). n/v 06/29/15  Yes Historical Provider, MD  morphine (ROXANOL) 20 MG/ML concentrated solution Take 1/2 ml to 41ml every 4 hours as need for pain 04/21/15  Yes Owens Shark, NP  Multiple Vitamins-Minerals (MULTIVITAMIN & MINERAL PO) Take 1 tablet by mouth daily.   Yes Historical Provider, MD  ondansetron (ZOFRAN) 4 MG tablet Take 1 tablet (4 mg total) by mouth every 8 (eight) hours as needed for nausea or vomiting. 12/02/14  Yes Owens Shark, NP  potassium chloride SA (K-DUR,KLOR-CON) 20 MEQ tablet Take 1 tablet (20 mEq total) by mouth daily. 03/07/15  Yes Orpah Greek, MD  DULoxetine (CYMBALTA) 30 MG capsule Take 30 mg by  mouth daily. 07/08/15   Historical Provider, MD  ferrous sulfate 325 (65 FE) MG tablet Take 325 mg by mouth 3 (three) times a week.     Historical Provider, MD  hydroxychloroquine (PLAQUENIL) 200 MG tablet Take 1 tablet (200 mg total) by mouth 2 (two) times daily. Patient taking differently: Take 400 mg by mouth at bedtime.  08/22/15   Charlynne Cousins, MD   Allergies  Allergen Reactions  . Compazine [Prochlorperazine Maleate] Other (See Comments)    Dystonia  . Azithromycin Rash  . Sulfa Antibiotics Rash    hallucinations  . Hydrocodone-Acetaminophen Nausea And Vomiting    Hallucinating   . 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 SHE DOES NOT WANT ANY BLOOD transfusions but will accept Albumin,    Review of Systems + for restless ness, generalized pain, dyspnea.   Physical Exam Mild to moderate distress S1 S2 Shallow rapid breathing Abdomen soft Cachectic No edema Awake, alert, some what confused.   Vital Signs: BP 107/73 mmHg  Pulse 112  Temp(Src) 97.5 F (36.4 C) (Axillary)  Resp 26  SpO2 100% Pain Assessment: No/denies pain   Pain Score: Asleep   SpO2: SpO2: 100 % O2 Device:SpO2: 100 % O2 Flow Rate: .   IO: Intake/output summary:  Intake/Output Summary (Last 24 hours) at 08/25/15 [id number] Last data filed at 08/25/15 0600  Gross per 24 hour  Intake  507.5 ml  Output   1350 ml  Net -842.5 ml    LBM:   Baseline Weight:   Most recent weight:       Palliative Assessment/Data:   Flowsheet Rows        Most Recent Value   Intake Tab    Referral Department  Critical care   Unit at Time of Referral  ICU   Palliative Care Primary Diagnosis  Cancer   Palliative Care Type  New Palliative care   Reason for referral  Non-pain Symptom, Clarify Goals of Care   Date first seen by Palliative Care  08/25/15   Clinical Assessment    Palliative Performance Scale Score  20%   Pain Max last 24 hours  6   Pain Min Last 24 hours  4   Dyspnea Max Last 24 Hours  7   Dyspnea  Min Last 24 hours  5   Psychosocial & Spiritual Assessment    Palliative Care Outcomes  Patient/Family meeting held?  Yes   Who was at the meeting?  mother   Palliative Care follow-up planned  Yes, Facility      Time In: 7 Time Out: 8:10 Time Total: 70 min  Greater than 50%  of this time was spent counseling and coordinating care related to the above assessment and plan.  Signed by: Loistine Chance, MD  [phone]  Please contact Palliative Medicine Team phone at [phone] for questions and concerns.  For individual provider: See Shea Evans

## 2015-08-25 NOTE — Progress Notes (Signed)
PULMONARY / CRITICAL CARE MEDICINE   Name: Andrea Best MRN: [id number] DOB: 10-14-1970    ADMISSION DATE:  08/24/2015 CONSULTATION DATE:  08/24/2015  REFERRING MD:  Dr. Alcario Drought  CHIEF COMPLAINT:  Short of breath  HISTORY OF PRESENT ILLNESS:   45 y/o female with progressive dyspnea over the past 24 hours.  She was hypoxic in ER and started on BiPAP.  She has hx of gastric cancer and has been getting chemotherapy in [place] with cancer treatment [facility] [place].  She was dx with cancer in 2015 and was being treated locally at first..  She also has hx of lupus.  Her chest xray showed b/l ASD.  She was started on antibiotics.  Hospitalist discussed goals of care with family >> initially made DNR, but family didn't fully understand conversation.  After further discussion decided for full code status.  She was in hospital from 5/22 to 5/28 with neutropenic fever, sepsis, diarrhea, pancytopenia, and severe protein calorie malnutrition.  She was tx with Abx, and granix.    SUBJECTIVE:  Patient reports feeling better.  Denies acute complaints.  Family support at bedside.   VITAL SIGNS: BP 119/87 mmHg  Pulse 108  Temp(Src) 97 F (36.1 C) (Axillary)  Resp 33  SpO2 100%  HEMODYNAMICS:    VENTILATOR SETTINGS: Vent Mode:  [-] BIPAP;PCV FiO2 (%):  [50 %] 50 % Set Rate:  [8 bmp] 8 bmp PEEP:  [5 cmH20] 5 cmH20  INTAKE / OUTPUT: I/O last 3 completed shifts: In: 507.5 [I.V.:457.5; IV Piggyback:50] Out: 1350 [WCBJS:2831]  PHYSICAL EXAMINATION: General:  Cachetic female lying in bed Neuro:  Sleepy, wakes up easily, follows commands, moves all extremities HEENT:  MM pink/moist, mild JVD Cardiovascular:  Regular, tachycardic, port Rt upper chest Lungs:  Tachypnea but non-labored, lungs bilaterally with faint crackles in bases Abdomen:  Mild distention, soft, non tender Musculoskeletal:  Decreased muscle bulk Skin: no rashes  LABS:  BMET  Recent Labs Lab 08/21/15 0900  08/24/15 1742 08/25/15 0540  NA 135 140 141  K 2.4* 3.2* 3.1*  CL 113* 117* 116*  CO2 18* 19* 22  BUN _0 CREATININE 0.87 0.85 1.01*  GLUCOSE 91 120* 138*    Electrolytes  Recent Labs Lab 08/21/15 0900 08/24/15 1742 08/25/15 0540  CALCIUM 8.2* 9.1 9.0  MG  --   --  1.6*  PHOS  --   --  2.5    CBC  Recent Labs Lab 08/21/15 0900 08/24/15 1742 08/25/15 0540  WBC 13.1* 36.9* 28.5*  HGB 7.2* 7.3* 7.4*  HCT 21.9* 22.0* 22.7*  PLT 149* 124* 114*    Coag's No results for input(s): APTT, INR in the last 168 hours.  Sepsis Markers  Recent Labs Lab 08/24/15 1752 08/24/15 2330 08/25/15 0540  LATICACIDVEN 1.64  --   --   PROCALCITON  --  0.96 0.97    ABG  Recent Labs Lab 08/24/15 1752  PHART 7.307*  PCO2ART 37.8  PO2ART 248*    Liver Enzymes  Recent Labs Lab 08/21/15 0900 08/24/15 1742 08/25/15 0540  AST 13* 28 23  ALT 9* 13* 13*  ALKPHOS 76 115 105  BILITOT 1.0 0.9 0.6  ALBUMIN 1.3* 1.7* 1.6*    Cardiac Enzymes  Recent Labs Lab 08/24/15 1742  TROPONINI <0.03    Glucose  Recent Labs Lab 08/25/15 0402 08/25/15 0815 08/25/15 0825  GLUCAP 124* 120* 116*    Imaging Dg Chest Port 1 View  08/25/2015  CLINICAL DATA:  Acute onset of dyspnea. Decreased O2 saturation. Initial encounter. EXAM: PORTABLE CHEST 1 VIEW COMPARISON:  Chest radiograph performed 08/24/2015 FINDINGS: The lungs are hypoexpanded. Vascular crowding and vascular congestion are noted. Hazy bilateral airspace opacification is improved from the prior study, and may reflect pneumonia or pulmonary edema. Residual ARDS cannot be excluded. No pleural effusion or pneumothorax is seen. The cardiomediastinal silhouette is normal in size. A right-sided chest port is noted ending about the proximal right atrium. No acute osseous abnormalities are seen. IMPRESSION: Lungs hypoexpanded. Vascular congestion noted. Hazy bilateral airspace opacification is improved from the prior study,  and may reflect pneumonia or pulmonary edema. Residual ARDS cannot be excluded. Electronically Signed   By: Garald Balding M.D.   On: 08/25/2015 00:46   Dg Chest Port 1 View  08/24/2015  CLINICAL DATA:  Worsening shortness of breath since yesterday. Hx of tachycardia, asthma, pneumonia. Gastric carcinoma. Lupus and Sjogren's disease. EXAM: PORTABLE CHEST 1 VIEW COMPARISON:  08/16/2015 FINDINGS: Low lung volumes are again demonstrated. Worsening diffuse bilateral pulmonary airspace disease is seen. No evidence of pneumothorax or pleural effusion. Heart size remains within normal limits. Left-sided Port-A-Cath remains in appropriate position. IMPRESSION: Worsening bilateral diffuse airspace disease. Electronically Signed   By: Earle Gell M.D.   On: 08/24/2015 18:25     STUDIES:   CULTURES: 5/30 Blood >> 5/30 Urine >> 5/30 Sputum >> 5/30 Legionella Ag >> 5/30 Pneumococcal Ag >>  ANTIBIOTICS: 5/30 Vancomycin >>  5/30 Zosyn >>   SIGNIFICANT EVENTS: 5/30 Admit with hypoxic respiratory failure, bilateral pulmonary infiltrates  LINES/TUBES: Rt port >>  DISCUSSION: 45 y/o female with Stage IV gastric cancer, and recent admit for febrile neutropenia now presents with acute hypoxic respiratory failure and b/l pulmonary infiltrates.  Concern is for HCAP versus reaction to filgastrim.  Another possibility, but less likely, is pneumonitis related to hx of lupus and Sjogren's.  ASSESSMENT / PLAN:  PULMONARY A: Acute hypoxic respiratory failure - in setting of bilateral pulmonary infiltrates.  BNP 324, ESR 55, PCT mildly elevated (not sure how to interpret this in the setting of malignancy / inflammation).  CXR improved 5/31 P:   Bipap PRN for increased WOB.  May need to cycle support - on 2 hours, off 4 hours as tolerated High risk for intubation, remains full code for now Solumedrol 60 mg q6h Follow procalcitonin Await ANA F/u CXR  CARDIOVASCULAR A:  Sinus tachycardia in setting of  respiratory failure. P:  Monitor hemodynamics D51/2NS @ 50 ml/hr  RENAL A:   Hypokalemia. P:   Trend BMP / UOP  Replace electrolytes as needed  GASTROINTESTINAL A:   Severe protein calorie malnutrition. P:   NPO Protonix for SUP  HEMATOLOGIC A:   Stage IV gastric cancer. Anemia of chronic disease and critical illness. Chemotherapy induced neutropenia s/p tx with filgastrim. Thrombocytopenia  P:  F/u CBC Lovenox of DVT prevention  INFECTIOUS A:   Possible HCAP. P:   Day 2 vancomycin, cefepime  ENDOCRINE A:   No acute issues.   P:   F/u blood sugar on BMET  NEUROLOGIC A:   Cancer pain. P:   PRN morphine, cymbalta  RHEUMATOLOGY A: Hx of lupus, Sjogren's syndrome. P: Continue plaquenil  Goals of Care >> 5/31  Ongoing discussion with patient and family.  Patient continues to request full code status.  Mother is concerned that she might not come off support if intubated.  She defers decision to daughter.  They have worked through a MOST form  and she has it at home.  She is going to bring it in for review.  FULL CODE for now.    Noe Gens, NP-C [facility] Pgr: [phone] or if no answer [phone] 08/25/2015, 10:32 AM

## 2015-08-25 NOTE — Progress Notes (Signed)
[facility] Progress Note Patient Name: Andrea Best DOB: 09/14/1970 MRN: [id number]   Date of Service  08/25/2015  HPI/Events of Note  K 3.1  eICU Interventions  Repleted     Intervention Category Intermediate Interventions: Electrolyte abnormality - evaluation and management  Andrea Best 08/25/2015, 6:43 AM

## 2015-08-26 ENCOUNTER — Inpatient Hospital Stay (HOSPITAL_COMMUNITY): Payer: BLUE CROSS/BLUE SHIELD

## 2015-08-26 DIAGNOSIS — J9600 Acute respiratory failure, unspecified whether with hypoxia or hypercapnia: Secondary | ICD-10-CM

## 2015-08-26 DIAGNOSIS — J9601 Acute respiratory failure with hypoxia: Secondary | ICD-10-CM

## 2015-08-26 LAB — BASIC METABOLIC PANEL
ANION GAP: 5 (ref 5–15)
BUN: 16 mg/dL (ref 6–20)
CALCIUM: 9 mg/dL (ref 8.9–10.3)
CO2: 21 mmol/L — AB (ref 22–32)
Chloride: 114 mmol/L — ABNORMAL HIGH (ref 101–111)
Creatinine, Ser: 0.89 mg/dL (ref 0.44–1.00)
Glucose, Bld: 139 mg/dL — ABNORMAL HIGH (ref 65–99)
POTASSIUM: 4 mmol/L (ref 3.5–5.1)
Sodium: 140 mmol/L (ref 135–145)

## 2015-08-26 LAB — GLUCOSE, CAPILLARY
GLUCOSE-CAPILLARY: 100 mg/dL — AB (ref 65–99)
GLUCOSE-CAPILLARY: 122 mg/dL — AB (ref 65–99)
GLUCOSE-CAPILLARY: 124 mg/dL — AB (ref 65–99)
GLUCOSE-CAPILLARY: 134 mg/dL — AB (ref 65–99)
Glucose-Capillary: 131 mg/dL — ABNORMAL HIGH (ref 65–99)
Glucose-Capillary: 123 mg/dL — ABNORMAL HIGH (ref 65–99)
Glucose-Capillary: 121 mg/dL — ABNORMAL HIGH (ref 65–99)

## 2015-08-26 LAB — URINE CULTURE

## 2015-08-26 LAB — PROCALCITONIN: PROCALCITONIN: 0.72 ng/mL

## 2015-08-26 LAB — CBC
HEMATOCRIT: 23.4 % — AB (ref 36.0–46.0)
HEMOGLOBIN: 7.8 g/dL — AB (ref 12.0–15.0)
MCH: 29.4 pg (ref 26.0–34.0)
MCHC: 33.3 g/dL (ref 30.0–36.0)
MCV: 88.3 fL (ref 78.0–100.0)
Platelets: 102 10*3/uL — ABNORMAL LOW (ref 150–400)
RBC: 2.65 MIL/uL — AB (ref 3.87–5.11)
RDW: 26.8 % — ABNORMAL HIGH (ref 11.5–15.5)
WBC: 36.7 10*3/uL — ABNORMAL HIGH (ref 4.0–10.5)

## 2015-08-26 LAB — MAGNESIUM: Magnesium: 2.1 mg/dL (ref 1.7–2.4)

## 2015-08-26 MED ORDER — CETYLPYRIDINIUM CHLORIDE 0.05 % MT LIQD
7.0000 mL | Freq: Two times a day (BID) | OROMUCOSAL | Status: DC
  Administered 2015-08-26 – 2015-08-27 (×2): 7 mL via OROMUCOSAL

## 2015-08-26 NOTE — Progress Notes (Signed)
Daily Progress Note   Patient Name: Andrea Best       Date: 08/26/2015 DOB: [birth year]  Age: 45 y.o. MRN#: [id number] Attending Physician: Chesley Mires, MD Primary Care Physician: Aretta Nip, MD Admit Date: 08/24/2015  Reason for Consultation/Follow-up: Establishing goals of care  Subjective:  on BiPaP currently, she is resting well. Some what more awake/ alert than yesterday.  Tolerating BiPAP breaks for eating. In no distress currently.   Length of Stay: 2  Current Medications: Scheduled Meds:  . ceFEPime (MAXIPIME) IV  1 g Intravenous Q8H  . DULoxetine  30 mg Oral Daily  . enoxaparin (LOVENOX) injection  40 mg Subcutaneous Q24H  . feeding supplement  1 Container Oral TID BM  . hydroxychloroquine  400 mg Oral QHS  . insulin aspart  0-9 Units Subcutaneous Q4H  . methylPREDNISolone (SOLU-MEDROL) injection  60 mg Intravenous Q6H  . vancomycin  750 mg Intravenous Q12H    Continuous Infusions: . dextrose 5 % and 0.45 % NaCl with KCl 20 mEq/L 50 mL/hr at 08/25/15 1739    PRN Meds: diphenoxylate-atropine, dronabinol, morphine injection, morphine CONCENTRATE, ondansetron (ZOFRAN) IV  Physical Exam         Thin cachectic female NAD S1 S2 Poor inspiratory effort, scattered rhonchi anteriorly Abdomen soft mildly distended No edema Awake, some what alert.   Vital Signs: BP 126/90 mmHg  Pulse 103  Temp(Src) 96.7 F (35.9 C) (Axillary)  Resp 21  SpO2 100% SpO2: SpO2: 100 % O2 Device: O2 Device: Bi-PAP, Ventilator O2 Flow Rate: O2 Flow Rate (L/min): 3 L/min  Intake/output summary:  Intake/Output Summary (Last 24 hours) at 08/26/15 0950 Last data filed at 08/26/15 0459  Gross per 24 hour  Intake 2093.33 ml  Output    570 ml  Net 1523.33 ml   LBM:     Baseline Weight:   Most recent weight:         Palliative Assessment/Data:    Flowsheet Rows        Most Recent Value   Intake Tab    Referral Department  Critical care   Unit at Time of Referral  ICU   Palliative Care Primary Diagnosis  Cancer   Date Notified  08/24/15   Palliative Care Type  Return patient Palliative Care   Reason for referral  Clarify Goals of Care   Date of Admission  08/24/15   Date first seen by Palliative Care  08/25/15   # of days Palliative referral response time  1 Day(s)   # of days IP prior to Palliative referral  0   Clinical Assessment    Palliative Performance Scale Score  30%   Pain Max last 24 hours  5   Pain Min Last 24 hours  4   Dyspnea Max Last 24 Hours  7   Dyspnea Min Last 24 hours  6   Psychosocial & Spiritual Assessment    Palliative Care Outcomes    Patient/Family meeting held?  Yes   Who was at the meeting?  patient mother    Palliative Care follow-up planned  Yes, Home      Patient Active Problem List   Diagnosis Date Noted  . Encounter for palliative care   . ARDS (adult respiratory distress syndrome) ([place]) 08/24/2015  . Acute respiratory failure with hypoxia ([place]) 08/24/2015  . Acute respiratory failure ([place]) 08/24/2015  . Peritoneal carcinoma ([place]) 08/22/2015  . Acute encephalopathy 08/21/2015  . Neoplasm related pain   . Palliative care encounter   . Goals of care, counseling/discussion   . Abdominal distention   . Neutropenic fever ([place]) 08/16/2015  . Sepsis ([place]) 08/16/2015  . Occult blood in stools 08/16/2015  . Pancytopenia ([place]) 08/16/2015  . Fever and neutropenia ([place]) 08/16/2015  . Weakness 06/03/2015  . Antineoplastic chemotherapy induced anemia 06/03/2015  . Ascites 06/03/2015  . Hypotension 06/03/2015  . Peripheral edema 05/29/2015  . Dehydration 05/29/2015  . Cancer associated pain 03/03/2015  . Genetic testing 02/25/2015  . Hydronephrosis with renal and ureteral calculus obstruction   .  Malignant neoplasm of stomach ([place])   . Abdominal pain, generalized   . Dry eyes   . E coli bacteremia   . Bacteremia due to Klebsiella pneumoniae   . 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 Bacteremia   . Malnutrition of moderate degree 01/07/2015  . Arterial hypotension   . Biliary obstruction   . Weight loss 11/13/2014  . Diarrhea 11/13/2014  . URI (upper respiratory infection) 12/15/2013  . Hand foot syndrome 12/15/2013  . 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 Anorexia 12/15/2013  . 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 Candida tropicalis infection 10/27/2013  . HAP (hospital-acquired pneumonia) 10/27/2013  . Leucocytosis 10/20/2013  . HCAP (healthcare-associated pneumonia) 10/20/2013  . Fever, unspecified 10/12/2013  . Tachycardia 10/12/2013  . Hypomagnesemia 10/11/2013  . Protein-calorie malnutrition, severe ([place]) 10/09/2013  . Gastric outlet obstruction 10/08/2013  . Anemia, iron deficiency 10/08/2013  . Gastric cancer ([place]) 10/07/2013  . Stomach pain 06/12/2013  . Joint pain 07/28/2011    Palliative Care Assessment & Plan   Patient Profile:  45 yo with metastatic gastric cancer, recently admitted for neutropenic fever, received palliative chemo at [facility] of [place], now admitted with resp failure.   Assessment:  gastric cancer resp failure Possible PNA versus ARDS   Recommendations/Plan:  Goals of Care and Additional Recommendations:   [place]:  Patient's mother brought in the MOST form that she had begun to fill out when seen by Palliative MD Dr Domingo Cocking in recent hospitalization Aug 20, 2015. Patient and her mother had elected DNR, limited interventions, no intubation, no mechanical ventilation, IV Antibiotics and artificial nutrition for a defined trial period. This MOST form is not signed, patient and mother were apparently going  to take this to the patient's PCP for signatures before  she had to get hospitalized this time.   Discussed frankly with the patient's mother about the incurable nature of the patient's gastric cancer and now her tenuous respiratory status. Offered supportive care and active listening. Patient on BiPAP, unable to follow along with our conversations, unable to participate.   PLAN: 1. Patient's mother wishes to continue with FULL code, depending on how this hospitalization goes, we may complete a new MOST form later.  2. Patient's mother is appreciative of pulmonary/CCM efforts. She is thankful that the BiPAP is working, she states she realizes that the patient cannot be on BiPAP permanently. She is hopeful that the patient will not have to undergo intubation and mechanical ventilation, she is hopeful that the patient will eventually get off the BiPAP pretty soon.  3. Patient's mother wishes to take the patient back home with her towards the end of this hospitalization. She does not want to talk about Hospice support this morning.    Palliative will continue gentle conversations with the patient's mother, continue to follow disease trajectory.    Code Status:    Code Status Orders        Start     Ordered   08/24/15 2250  Full code   Continuous     08/24/15 2249    Code Status History    Date Active Date Inactive Code Status Order ID Comments User Context   08/24/2015  9:12 PM 08/24/2015 10:49 PM DNR [id number]  Etta Quill, DO ED   08/20/2015 11:48 AM 08/22/2015  2:22 PM Full Code [id number]  Micheline Rough, MD Inpatient   01/20/2015  6:16 PM 02/05/2015  2:55 PM Full Code [id number]  Hosie Poisson, MD Inpatient   01/06/2015  2:43 PM 01/11/2015  7:17 PM Full Code [id number]  Erick Colace, NP ED   06/24/2014  1:29 PM 06/25/2014  3:43 AM Full Code [id number]  Greggory Keen, MD HOV   10/21/2013 11:16 AM 10/27/2013  8:14 PM Full Code [id number]  Marybelle Killings, MD Inpatient   10/08/2013 11:25 PM 10/21/2013 11:16 AM Full Code [id number]  Rise Patience, MD  Inpatient    Advance Directive Documentation        Most Recent Value   Type of Advance Directive  Healthcare Power of Attorney, Living will   Pre-existing out of facility DNR order (yellow form or pink MOST form)     "MOST" Form in Place?         Prognosis:   guarded  Discharge Planning:  To Be Determined  Care plan was discussed with patient, mother.   Thank you for allowing the Palliative Medicine Team to assist in the care of this patient.   Time In: 9 Time Out: 935 Total Time 35 Prolonged Time Billed  no       Greater than 50%  of this time was spent counseling and coordinating care related to the above assessment and plan.  Loistine Chance, MD [phone]  Please contact Palliative Medicine Team phone at [phone] for questions and concerns.

## 2015-08-26 NOTE — Progress Notes (Signed)
PULMONARY / CRITICAL CARE MEDICINE   Name: Andrea Best MRN: [id number] DOB: 1970/08/17    ADMISSION DATE:  08/24/2015 CONSULTATION DATE:  08/24/2015  REFERRING MD:  Dr. Alcario Drought  CHIEF COMPLAINT:  Short of breath  HISTORY OF PRESENT ILLNESS:   45 y/o female with hx stage IV gastric cancer (dx 2015 - getting chemo at [facility] [place] in [place]), SLE, sjogren's admitted 5/30 with progressive dyspnea.  She was hypoxic in ER and started on BiPAP.  Her chest x-ray showed b/l ASD.  She was started on antibiotics.  Hospitalist discussed goals of care with family >> initially made DNR, but family didn't fully understand conversation.  After further discussion decided for full code status.    She was in hospital from 5/22 to 5/28 with neutropenic fever, sepsis, diarrhea, pancytopenia, and severe protein calorie malnutrition.  She was tx with Abx, and granix.    SUBJECTIVE:  No change overnight. Sitting OOB in chair.  Wore bipap.  No c/o.   VITAL SIGNS: BP 126/90 mmHg  Pulse 103  Temp(Src) 96.7 F (35.9 C) (Axillary)  Resp 21  SpO2 100%  HEMODYNAMICS:    VENTILATOR SETTINGS: Vent Mode:  [-] BIPAP;PCV FiO2 (%):  [35 %-40 %] 35 % Set Rate:  [8 bmp] 8 bmp PEEP:  [5 cmH20] 5 cmH20  INTAKE / OUTPUT: I/O last 3 completed shifts: In: 2700.8 [P.O.:240; I.V.:1710.8; IV Piggyback:750] Out: 1920 [Urine:1920]  PHYSICAL EXAMINATION: General:  Cachetic female sitting OOB in chair, NAD  Neuro:  Sleepy, wakes up easily, follows commands, moves all extremities, appropriate, generalized weakness  HEENT:  MM pink/moist, mild JVD Cardiovascular:  Regular, tachycardic, port Rt upper chest Lungs:  resps even non labored, mild tachypnea but no increased WOB, lungs bilaterally with faint crackles in bases Abdomen:  Mild distention, soft, non tender Musculoskeletal:  cachectic Skin: no rashes  LABS:  BMET  Recent Labs Lab 08/24/15 1742 08/25/15 0540 08/26/15 0500  NA  140 141 140  K 3.2* 3.1* 4.0  CL 117* 116* 114*  CO2 19* 22 21*  BUN 17 16 16   CREATININE 0.85 1.01* 0.89  GLUCOSE 120* 138* 139*    Electrolytes  Recent Labs Lab 08/24/15 1742 08/25/15 0540 08/26/15 0500  CALCIUM 9.1 9.0 9.0  MG  --  1.6* 2.1  PHOS  --  2.5  --     CBC  Recent Labs Lab 08/24/15 1742 08/25/15 0540 08/26/15 0500  WBC 36.9* 28.5* 36.7*  HGB 7.3* 7.4* 7.8*  HCT 22.0* 22.7* 23.4*  PLT 124* 114* 102*    Coag's No results for input(s): APTT, INR in the last 168 hours.  Sepsis Markers  Recent Labs Lab 08/24/15 1752 08/24/15 2330 08/25/15 0540 08/26/15 0500  LATICACIDVEN 1.64  --   --   --   PROCALCITON  --  0.96 0.97 0.72    ABG  Recent Labs Lab 08/24/15 1752  PHART 7.307*  PCO2ART 37.8  PO2ART 248*    Liver Enzymes  Recent Labs Lab 08/21/15 0900 08/24/15 1742 08/25/15 0540  AST 13* 28 23  ALT 9* 13* 13*  ALKPHOS 76 115 105  BILITOT 1.0 0.9 0.6  ALBUMIN 1.3* 1.7* 1.6*    Cardiac Enzymes  Recent Labs Lab 08/24/15 1742  TROPONINI <0.03    Glucose  Recent Labs Lab 08/25/15 1131 08/25/15 1608 08/25/15 1941 08/26/15 0011 08/26/15 0407 08/26/15 0738  GLUCAP 123* 116* 118* 134* 100* 131*    Imaging Dg Chest Port 1 [address]  08/26/2015  CLINICAL DATA:  Patient with acute respiratory failure. EXAM: PORTABLE CHEST 1 VIEW COMPARISON:  Chest radiograph 08/24/2025. FINDINGS: Right anterior chest wall Port-A-Cath is present, stable in position with tip projecting over the right atrium. Multiple monitoring leads overlie the patient. Stable cardiac and mediastinal contours. Grossly unchanged bilateral left-greater-than-right hazy pulmonary opacities. No definite pleural effusion or pneumothorax. Low lung volumes. IMPRESSION: Stable support apparatus. Stable hazy opacities bilaterally which may represent edema or infection. Electronically Signed   By: Lovey Newcomer M.D.   On: 08/26/2015 07:03     STUDIES:   CULTURES: 5/30 Blood  >> 5/30 Urine >> 5/30 Sputum >> 5/30 Legionella Ag >> 5/30 Pneumococcal Ag >>neg   ANTIBIOTICS: 5/30 Vancomycin >>  5/30 Zosyn >> 5/31 5/31 cefepime>>>  SIGNIFICANT EVENTS: 5/30 Admit with hypoxic respiratory failure, bilateral pulmonary infiltrates  LINES/TUBES: Rt PAC>>  DISCUSSION: 45 y/o female with Stage IV gastric cancer, and recent admit for febrile neutropenia now presents with acute hypoxic respiratory failure and b/l pulmonary infiltrates.  Concern is for HCAP versus reaction to filgastrim.  Another possibility, but less likely, is pneumonitis related to hx of lupus and Sjogren's.  ASSESSMENT / PLAN:  PULMONARY A: Acute hypoxic respiratory failure - in setting of bilateral pulmonary infiltrates.  BNP 324, ESR 55, PCT mildly elevated (not sure how to interpret this in the setting of malignancy / inflammation).  CXR improved 5/31 P:   Bipap PRN and qhs  Pulmonary hygiene  Solumedrol 60 mg q6h Follow procalcitonin Await ANA F/u CXR in am   CARDIOVASCULAR A:  Sinus tachycardia in setting of respiratory failure. P:  Monitor hemodynamics D5 1/2NS @ 50 ml/hr  RENAL A:   Hypokalemia. P:   Trend BMP / UOP  Replace electrolytes as needed  GASTROINTESTINAL A:   Severe protein calorie malnutrition. P:   Ok for PO diet as tol  Protonix for SUP  HEMATOLOGIC A:   Stage IV gastric cancer. Anemia of chronic disease and critical illness. Chemotherapy induced neutropenia s/p tx with filgastrim. Thrombocytopenia  P:  F/u CBC Lovenox for DVT prevention  INFECTIOUS A:   Possible HCAP. P:   Day 3 vancomycin, cefepime  ENDOCRINE A:   No acute issues.   P:   F/u blood sugar on BMET  NEUROLOGIC A:   Cancer pain. P:   PRN morphine, cymbalta  RHEUMATOLOGY A: Hx of lupus, Sjogren's syndrome. P: Continue plaquenil  Goals of Care >> 5/31  Ongoing discussion with patient and family.  Patient continues to request full code status.  Mother is concerned  that she might not come off support if intubated.  She defers decision to daughter.  They have worked through a MOST form and she has it at home.  She is going to bring it in for review.  FULL CODE for now.     Nickolas Madrid, NP 08/26/2015  9:54 AM Pager: [phone] or [phone]

## 2015-08-26 NOTE — Progress Notes (Signed)
At this time- PT does not appear to be in respiratory distress. PT appears to be sleeping peacefully. Sp02 97% on 2 lpm [place], RR 14.

## 2015-08-26 NOTE — Progress Notes (Signed)
RT removed PT from BiPAP and [laced on 2 lpm [place]- sp02 98%, HR 102, rr 25, BP 126/90. RN at bedside. No respiratory distress noted at this time.

## 2015-08-26 NOTE — Progress Notes (Signed)
PT does not appear to be in respiratory distress at this time. Resting comfortably on 2 lpm [place]- Sp02 97%, RR 22.

## 2015-08-26 NOTE — Progress Notes (Addendum)
Pharmacy Antibiotic Note  Andrea Best is a 45 y.o. female admitted on 08/24/2015 with pneumonia.  Recent admission (discharged on 08/22/15).  Patient presents with c/o shortness of breath.  PMH significant for Gastric Cancer (undergoing treatment in [place]), lupus and chornic anemia.  Pharmacy has been consulted for Vancomycin & Cefepime dosing.  Plan:  Continue vancomycin 750mg  IV q12h; trough goal 15-20 mcg/ml  Given recent admission, tx with vanc/cefepime at that time, and subsequent readmission, will check VT to ensure adequate levels (never checked during last visit)  Continue cefepime 1gm IV q8h  F/U cultures/sensitivities  Follow renal function    Temp (24hrs), Avg:97.2 F (36.2 C), Min:96.7 F (35.9 C), Max:97.7 F (36.5 C)   Recent Labs Lab 08/21/15 0900 08/24/15 1742 08/24/15 1752 08/25/15 0540 08/26/15 0500  WBC 13.1* 36.9*  --  28.5* 36.7*  CREATININE 0.87 0.85  --  1.01* 0.89  LATICACIDVEN  --   --  1.64  --   --     Estimated Creatinine Clearance: 70.5 mL/min (by C-G formula based on Cr of 0.89).    Allergies  Allergen Reactions  . Compazine [Prochlorperazine Maleate] Other (See Comments)    Dystonia  . Azithromycin Rash  . Sulfa Antibiotics Rash    hallucinations  . Hydrocodone-Acetaminophen Nausea And Vomiting    Hallucinating   . 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 SHE DOES NOT WANT ANY BLOOD transfusions but will accept Albumin,     Antimicrobials this admission: 5/30 Vanc >>   5/30 Cefepime >>    Dose adjustments this admission:    Microbiology results: 5/30 BCx:  ngtd 5/30 UCx:   sent 5/31 strep pneumo ur ag: neg 5/31 legionella ur ag: IP  Thank you for allowing pharmacy to be a part of this patient's care.  Reuel Boom, PharmD, BCPS Pager: [phone] 08/26/2015, 11:13 AM   ADDENDUM: vancomycin stopped by PCCM 6/1 Reuel Boom, PharmD, BCPS Pager: [phone] 08/26/2015, 12:16 PM

## 2015-08-27 ENCOUNTER — Inpatient Hospital Stay (HOSPITAL_COMMUNITY): Payer: BLUE CROSS/BLUE SHIELD

## 2015-08-27 DIAGNOSIS — C169 Malignant neoplasm of stomach, unspecified: Secondary | ICD-10-CM

## 2015-08-27 DIAGNOSIS — D6481 Anemia due to antineoplastic chemotherapy: Secondary | ICD-10-CM

## 2015-08-27 DIAGNOSIS — J80 Acute respiratory distress syndrome: Principal | ICD-10-CM

## 2015-08-27 DIAGNOSIS — T451X5A Adverse effect of antineoplastic and immunosuppressive drugs, initial encounter: Secondary | ICD-10-CM

## 2015-08-27 DIAGNOSIS — E43 Unspecified severe protein-calorie malnutrition: Secondary | ICD-10-CM

## 2015-08-27 DIAGNOSIS — J189 Pneumonia, unspecified organism: Secondary | ICD-10-CM

## 2015-08-27 LAB — FANA STAINING PATTERNS: Speckled Pattern: >1:1280 {titer}

## 2015-08-27 LAB — GLUCOSE, CAPILLARY
GLUCOSE-CAPILLARY: 123 mg/dL — AB (ref 65–99)
GLUCOSE-CAPILLARY: 125 mg/dL — AB (ref 65–99)
GLUCOSE-CAPILLARY: 159 mg/dL — AB (ref 65–99)
GLUCOSE-CAPILLARY: 86 mg/dL (ref 65–99)

## 2015-08-27 LAB — BASIC METABOLIC PANEL
Anion gap: 5 (ref 5–15)
BUN: 18 mg/dL (ref 6–20)
CALCIUM: 9.4 mg/dL (ref 8.9–10.3)
CHLORIDE: 114 mmol/L — AB (ref 101–111)
CO2: 21 mmol/L — AB (ref 22–32)
CREATININE: 0.85 mg/dL (ref 0.44–1.00)
GFR calc Af Amer: >60 mL/min (ref >60)
GFR calc non Af Amer: >60 mL/min (ref >60)
Glucose, Bld: 127 mg/dL — ABNORMAL HIGH (ref 65–99)
Potassium: 3.9 mmol/L (ref 3.5–5.1)
SODIUM: 140 mmol/L (ref 135–145)

## 2015-08-27 LAB — RETICULOCYTES
RBC.: 2.63 MIL/uL — ABNORMAL LOW (ref 3.87–5.11)
RETIC CT PCT: 6.6 % — AB (ref 0.4–3.1)
Retic Count, Absolute: 173.6 10*3/uL (ref 19.0–186.0)

## 2015-08-27 LAB — CBC
HCT: 23.8 % — ABNORMAL LOW (ref 36.0–46.0)
HEMOGLOBIN: 7.7 g/dL — AB (ref 12.0–15.0)
MCH: 29.3 pg (ref 26.0–34.0)
MCHC: 32.4 g/dL (ref 30.0–36.0)
MCV: 90.5 fL (ref 78.0–100.0)
Platelets: 114 10*3/uL — ABNORMAL LOW (ref 150–400)
RBC: 2.63 MIL/uL — ABNORMAL LOW (ref 3.87–5.11)
RDW: 27.9 % — AB (ref 11.5–15.5)
WBC: 45.3 10*3/uL — ABNORMAL HIGH (ref 4.0–10.5)

## 2015-08-27 LAB — FOLATE: FOLATE: 10.3 ng/mL (ref >5.9)

## 2015-08-27 LAB — IRON AND TIBC
Iron: 59 ug/dL (ref 28–170)
Saturation Ratios: 48 % — ABNORMAL HIGH (ref 10.4–31.8)
TIBC: 123 ug/dL — AB (ref 250–450)
UIBC: 64 ug/dL

## 2015-08-27 LAB — ANTINUCLEAR ANTIBODIES, IFA: ANA Ab, IFA: POSITIVE — AB

## 2015-08-27 LAB — LEGIONELLA PNEUMOPHILA SEROGP 1 UR AG: L. pneumophila Serogp 1 Ur Ag: NEGATIVE

## 2015-08-27 LAB — VITAMIN B12: VITAMIN B 12: 2690 pg/mL — AB (ref 180–914)

## 2015-08-27 LAB — FERRITIN: Ferritin: 1174 ng/mL — ABNORMAL HIGH (ref 11–307)

## 2015-08-27 MED ORDER — PREDNISONE 20 MG PO TABS
60.0000 mg | ORAL_TABLET | Freq: Every day | ORAL | Status: DC
  Administered 2015-08-28: 60 mg via ORAL
  Filled 2015-08-27: qty 3

## 2015-08-27 MED ORDER — INSULIN ASPART 100 UNIT/ML ~~LOC~~ SOLN: 0.0000 [IU] | Freq: Three times a day (TID) | SUBCUTANEOUS | Status: DC

## 2015-08-27 MED ORDER — PREDNISONE 20 MG PO TABS: 50.0000 mg | ORAL_TABLET | Freq: Every day | ORAL | Status: DC

## 2015-08-27 MED ORDER — SODIUM CHLORIDE 0.9 % IV SOLN: Freq: Once | INTRAVENOUS | Status: DC

## 2015-08-27 MED ORDER — PREDNISONE 20 MG PO TABS: 20.0000 mg | ORAL_TABLET | Freq: Every day | ORAL | Status: DC

## 2015-08-27 MED ORDER — AMOXICILLIN-POT CLAVULANATE 875-125 MG PO TABS
1.0000 | ORAL_TABLET | Freq: Two times a day (BID) | ORAL | Status: DC
  Administered 2015-08-27 – 2015-08-28 (×3): 1 via ORAL
  Filled 2015-08-27 (×3): qty 1

## 2015-08-27 MED ORDER — PREDNISONE 20 MG PO TABS: 60.0000 mg | ORAL_TABLET | Freq: Every day | ORAL | Status: DC

## 2015-08-27 MED ORDER — PREDNISONE 10 MG PO TABS: 30.0000 mg | ORAL_TABLET | Freq: Every day | ORAL | Status: DC

## 2015-08-27 MED ORDER — PREDNISONE 10 MG PO TABS
10.0000 mg | ORAL_TABLET | Freq: Every day | ORAL | Status: DC
Start: 2015-09-22 — End: 2015-08-28

## 2015-08-27 MED ORDER — BISACODYL 10 MG RE SUPP
10.0000 mg | Freq: Every day | RECTAL | Status: DC | PRN
Start: 2015-08-27 — End: 2015-08-28
  Administered 2015-08-27: 10 mg via RECTAL
  Filled 2015-08-27: qty 1

## 2015-08-27 MED ORDER — DRONABINOL 2.5 MG PO CAPS: 5.0000 mg | ORAL_CAPSULE | Freq: Two times a day (BID) | ORAL | Status: DC

## 2015-08-27 MED ORDER — DRONABINOL 2.5 MG PO CAPS
5.0000 mg | ORAL_CAPSULE | Freq: Three times a day (TID) | ORAL | Status: DC
  Administered 2015-08-27 – 2015-08-28 (×5): 5 mg via ORAL
  Filled 2015-08-27 (×5): qty 2

## 2015-08-27 MED ORDER — PREDNISONE 20 MG PO TABS: 40.0000 mg | ORAL_TABLET | Freq: Every day | ORAL | Status: DC

## 2015-08-27 NOTE — Progress Notes (Signed)
PT had been removed from BiPAP prior to 0700. PT appears to be comfortable on 2 lpm [place] at this time. RN aware.

## 2015-08-27 NOTE — Progress Notes (Signed)
PULMONARY / CRITICAL CARE MEDICINE   Name: Andrea Best MRN: [id number] DOB: 1970/10/26    ADMISSION DATE:  08/24/2015 CONSULTATION DATE:  08/24/2015  REFERRING MD:  Dr. Alcario Drought  CHIEF COMPLAINT:  Short of breath  HISTORY OF PRESENT ILLNESS:   45 y/o female with hx stage IV gastric cancer (dx 2015 - getting chemo at [facility] [place] in [place]), SLE, sjogren's admitted 5/30 with progressive dyspnea.  She was hypoxic in ER and started on BiPAP.  Her chest x-ray showed b/l ASD.  She was started on antibiotics.  Hospitalist discussed goals of care with family >> initially made DNR, but family didn't fully understand conversation.  After further discussion decided for full code status.    She was in hospital from 5/22 to 5/28 with neutropenic fever, sepsis, diarrhea, pancytopenia, and severe protein calorie malnutrition.  She was tx with Abx, and granix.    SUBJECTIVE:   Slept with BIPAP due to desaturation O2 improved this morning Feels better eating  VITAL SIGNS: BP 125/76 mmHg  Pulse 104  Temp(Src) 98 F (36.7 C) (Axillary)  Resp 26  Ht 5\' 5"  (1.651 m)  Wt 119 lb 7.8 oz (54.2 kg)  BMI 19.88 kg/m2  SpO2 100%  HEMODYNAMICS:    VENTILATOR SETTINGS: Vent Mode:  [-] BIPAP;PCV FiO2 (%):  [35 %] 35 % Set Rate:  [10 bmp] 10 bmp PEEP:  [5 cmH20] 5 cmH20  INTAKE / OUTPUT: I/O last 3 completed shifts: In: 2793.3 [I.V.:2093.3; IV Piggyback:700] Out: 510 [Urine:510]  PHYSICAL EXAMINATION: General:  Cachectic, comfortable in bed HEENT: Temporal wasting, likely mouth faces Pulmonary: Clear to auscultation bilaterally normal air entry Cardiovascular regular rhythm, slightly tachycardic no murmurs gallops or rubs Belly soft nontender Extremities minimal edema Neuro awake alert, conversant  LABS:  BMET  Recent Labs Lab 08/25/15 0540 08/26/15 0500 08/27/15 0505  NA 141 140 140  K 3.1* 4.0 3.9  CL 116* 114* 114*  CO2 22 21* 21*  BUN 16 16 18    CREATININE 1.01* 0.89 0.85  GLUCOSE 138* 139* 127*    Electrolytes  Recent Labs Lab 08/25/15 0540 08/26/15 0500 08/27/15 0505  CALCIUM 9.0 9.0 9.4  MG 1.6* 2.1  --   PHOS 2.5  --   --     CBC  Recent Labs Lab 08/25/15 0540 08/26/15 0500 08/27/15 0505  WBC 28.5* 36.7* 45.3*  HGB 7.4* 7.8* 7.7*  HCT 22.7* 23.4* 23.8*  PLT 114* 102* 114*    Coag's No results for input(s): APTT, INR in the last 168 hours.  Sepsis Markers  Recent Labs Lab 08/24/15 1752 08/24/15 2330 08/25/15 0540 08/26/15 0500  LATICACIDVEN 1.64  --   --   --   PROCALCITON  --  0.96 0.97 0.72    ABG  Recent Labs Lab 08/24/15 1752  PHART 7.307*  PCO2ART 37.8  PO2ART 248*    Liver Enzymes  Recent Labs Lab 08/21/15 0900 08/24/15 1742 08/25/15 0540  AST 13* 28 23  ALT 9* 13* 13*  ALKPHOS 76 115 105  BILITOT 1.0 0.9 0.6  ALBUMIN 1.3* 1.7* 1.6*    Cardiac Enzymes  Recent Labs Lab 08/24/15 1742  TROPONINI <0.03    Glucose  Recent Labs Lab 08/26/15 1210 08/26/15 1523 08/26/15 2003 08/26/15 2331 08/27/15 0436 08/27/15 0831  GLUCAP 122* 123* 121* 124* 123* 125*    Imaging Dg Chest Port 1 View  08/27/2015  CLINICAL DATA:  Respiratory failure. EXAM: PORTABLE CHEST 1 VIEW COMPARISON:  08/26/2015. FINDINGS: PowerPort catheter stable position. Mediastinum hilar structures are stable. Heart size stable. Persistent but slightly improving bilateral airspace disease. No prominent pleural effusion. No pneumothorax. IMPRESSION: 1.  PowerPort catheter stable position. 2.  Persistent but slightly improving bilateral airspace disease. Electronically Signed   By: Marcello Moores  Register   On: 08/27/2015 07:01   08/27/2015 chest chest x-ray images personally reviewed: Improving bilateral airspace disease  STUDIES:   CULTURES: 5/30 Blood >> 5/30 Urine >> 5/30 Sputum >> 5/30 Legionella Ag >> 5/30 Pneumococcal Ag >>neg   ANTIBIOTICS: 5/30 Vancomycin >> June 1 5/30 Zosyn >> 5/31 5/31  cefepime>>> June 2 June 2 Augmentin>>  SIGNIFICANT EVENTS: 5/30 Admit with hypoxic respiratory failure, bilateral pulmonary infiltrates  LINES/TUBES: Rt PAC>>  DISCUSSION: 45 y/o female with Stage IV gastric cancer, and recent admit for febrile neutropenia now presents with acute hypoxic respiratory failure and b/l pulmonary infiltrates.    Her mother doesn't know exactly what she has received, but she thinks her cancer treatment recently included: Ramucirumab > no significant reports of pneumonitis Nivolumab> increased risk of pneumonia, 1-5% chance of ILD or pneumonitis  ASSESSMENT / PLAN:  PULMONARY A: Acute hypoxic respiratory failure with hypoxemia> improving DDx > HCAP, lupus related or drug related, unclear; but she has had a rapid improvement with high dose steroids and minimal purulent cough or fever which would suggest this was not HCAP They deny any recollection of an aspiration event Appears to be a steroid responsive process, (drug reaction? Lupus symptoms have been stable otherwise) P:   Try to sleep without BIPAP  Pulmonary hygiene  Change solumedrol to prednisone: 60mg  daily for 5 days, 50mg  daily for 5 days, 40mg  daily for 5 days, 30mg  daily for 5 days, 20mg  daily fof 5 days, 10mg  daily for 5 days then stop Make sure Oncology team is aware this could have been a drug reaction Follow procalcitonin Await ANA F/u CXR in am   Goals of Care >>  Limited Code, no CPR; patient and mother don't want ventilator per my conversation with them today, but they want to discuss this with her sisters and other relatives prior to making her full DNR. I explained that mechanical ventilation would not be helpful for them to meet their goals of increased quality of life and repeat cancer treatment if possible.  Patient and mother seem very realistic.  Roselie Awkward, MD [place] Pager: [phone] Cell: [phone] After 3pm or if no response, call [phone]

## 2015-08-27 NOTE — Progress Notes (Signed)
Pharmacy Antibiotic Note  Andrea Best is a 45 y.o. female admitted on 08/24/2015 with possible HCAP.  Recently admitted 5/22-5/28 for neutropenic fever, received Granix and abx (Cefepime/Vanc --> Levaquin).  PMH significant for Gastric Cancer (undergoing treatment in [place]), lupus and chronic anemia.  Pharmacy has been following for Cefepime dosing.  - No positive cxs during recent admission - CXR has significantly improved since admission - PCCM recommends 7 day course of Cefepime.  Today is day #4 of Cefepime.  Plan: Continue cefepime 1gm IV q8h.  Will add end time for order to complete after 7 days of treatment per PCCM recommendation.  Since renal function has been stable, do not anticipate further dose adjustments so will sign off at this time.  Please re-consult if needed.  Height: 5\' 5"  (165.1 cm) Weight: 119 lb 7.8 oz (54.2 kg) IBW/kg (Calculated) : 57  Temp (24hrs), Avg:97.7 F (36.5 C), Min:97.2 F (36.2 C), Max:98.3 F (36.8 C)   Recent Labs Lab 08/21/15 0900 08/24/15 1742 08/24/15 1752 08/25/15 0540 08/26/15 0500 08/27/15 0505  WBC 13.1* 36.9*  --  28.5* 36.7* 45.3*  CREATININE 0.87 0.85  --  1.01* 0.89 0.85  LATICACIDVEN  --   --  1.64  --   --   --     Estimated Creatinine Clearance: 73.8 mL/min (by C-G formula based on Cr of 0.85).    Allergies  Allergen Reactions  . Compazine [Prochlorperazine Maleate] Other (See Comments)    Dystonia  . Azithromycin Rash  . Sulfa Antibiotics Rash    hallucinations  . Hydrocodone-Acetaminophen Nausea And Vomiting    Hallucinating   . 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 SHE DOES NOT WANT ANY BLOOD transfusions but will accept Albumin,     Antimicrobials this admission: 5/30 Vanc >> 6/1 5/30 Cefepime >> (6/5)  Dose adjustments this admission:   Microbiology results: 5/30 BCx:  ngtd 5/30 UCx:  multiple species 5/31 strep pneumo ur ag: neg 5/31 legionella ur ag: IP  Thank you for allowing pharmacy to be  a part of this patient's care.

## 2015-08-27 NOTE — Progress Notes (Addendum)
PROGRESS NOTE    Andrea Best  [id number] DOB: [birth year] DOA: 08/24/2015  PCP: Aretta Nip, MD   Brief Narrative:  45 year old female with stage IV gastric cancer diagnosed in 2015 currently receiving chemotherapy, SLE Sjogren's syndrome on treatment. She presented to the hospital with acute respiratory failure and was treated with BiPAP. Pulmonary critical care consult was requested. She has been weaned down to 2 L of oxygen and transferred to the [facility] today.  Subjective: Patient is slightly confused. Mother is at bedside assisting with history. She states that she is on cycle 4 out of 6 of chemotherapy after which she will have a PET scan. She would like to take her home and would like to be aggressive with her nutrition to see if she can resume chemotherapy in the near future. The patient has no complaints of shortness of breath cough nausea vomiting or constipation.  Assessment & Plan:   Principal Problem:   ARDS / acute resp failure with hypoxia/ possible HCAP -Pulmonary critical care has a high suspicion that she has ARDS/pneumonitis from drug treatment -plan to treat with steroids but also treating for possible HCAP as she has been neutropenic from chemo and to ensure no other etiology has been missed -Assess O2 needs for home  Active Problems:   Gastric cancer stage 4 -Is seen at [facility] in [place] for treatment plans -receives chemotherapy here at the cancer center in [place] - mother wants patient to be a full code- patient is not oriented enough to make this decision for herself    Protein-calorie malnutrition, severe - anorexia -Breeze 3 times a day in addition to whatever else she can eat -Protein supplement twice a day - Marinol TID - prevent constipation from narcotics  Constipation - Dulcolax suppository    Antineoplastic chemotherapy induced anemia - Jehovah's witness- check anemia  panel  Leukocytosis - result of steroids and granix   Lupus, Sjogren's syndrome - cont Plaquenil    DVT prophylaxis: Lovenox Code Status: full code Family Communication: mother Disposition Plan: home in 1-2 days Consultants:   PCCM  Palliative care Procedures:   none Antimicrobials:  Anti-infectives    Start     Dose/Rate Route Frequency Ordered Stop   08/25/15 0800  vancomycin (VANCOCIN) IVPB 750 mg/150 ml premix  Status:  Discontinued     750 mg 150 mL/hr over 60 Minutes Intravenous Every 12 hours 08/24/15 1934 08/26/15 1210   08/25/15 0400  ceFEPIme (MAXIPIME) 1 g in dextrose 5 % 50 mL IVPB     1 g 100 mL/hr over 30 Minutes Intravenous Every 8 hours 08/24/15 2114     08/24/15 2200  hydroxychloroquine (PLAQUENIL) tablet 400 mg     400 mg Oral Daily at bedtime 08/24/15 2106     08/24/15 1930  ceFEPIme (MAXIPIME) 1 g in dextrose 5 % 50 mL IVPB     1 g 100 mL/hr over 30 Minutes Intravenous  Once 08/24/15 1909 08/24/15 2002   08/24/15 1930  vancomycin (VANCOCIN) IVPB 1000 mg/200 mL premix     1,000 mg 200 mL/hr over 60 Minutes Intravenous STAT 08/24/15 1920 08/24/15 2203       Objective: Filed Vitals:   08/27/15 0500 08/27/15 0605 08/27/15 0700 08/27/15 0800  BP: 130/93 130/90 129/91 125/76  Pulse: 100 101 103 104  Temp:    98 F (36.7 C)  TempSrc:    Axillary  Resp: 20 20 26 26   Height:  Weight:      SpO2: 100% 100% 100% 100%    Intake/Output Summary (Last 24 hours) at 08/27/15 0953 Last data filed at 08/27/15 0800  Gross per 24 hour  Intake   1550 ml  Output    290 ml  Net   1260 ml   Filed Weights   08/26/15 0800  Weight: 54.2 kg (119 lb 7.8 oz)    Examination: General exam: Appears comfortable, cachectic, almost inaudible voice HEENT: PERRLA, oral mucosa moist, no sclera icterus or thrush Respiratory system: Clear to auscultation- very poor air entry- no taking deep breaths- Respiratory effort normal. 100 % on 2 L Cardiovascular system: S1  & S2 heard, RRR.  No murmurs  Gastrointestinal system: Abdomen soft, non-tender,  distended. Normal bowel sound. No organomegaly Central nervous system: Alert and oriented. No focal neurological deficits. Extremities: No cyanosis, clubbing or edema Skin: No rashes or ulcers Psychiatry:  Mood & affect appropriate.     Data Reviewed: I have personally reviewed following labs and imaging studies  CBC:  Recent Labs Lab 08/21/15 0900 08/24/15 1742 08/25/15 0540 08/26/15 0500 08/27/15 0505  WBC 13.1* 36.9* 28.5* 36.7* 45.3*  NEUTROABS  --  33.6*  --   --   --   HGB 7.2* 7.3* 7.4* 7.8* 7.7*  HCT 21.9* 22.0* 22.7* 23.4* 23.8*  MCV 85.5 87.3 88.0 88.3 90.5  PLT 149* 124* 114* 102* [id number]*   Basic Metabolic Panel:  Recent Labs Lab 08/21/15 0900 08/24/15 1742 08/25/15 0540 08/26/15 0500 08/27/15 0505  NA 135 140 141 140 140  K 2.4* 3.2* 3.1* 4.0 3.9  CL 113* 117* 116* 114* 114*  CO2 18* 19* 22 21* 21*  GLUCOSE 91 120* 138* 139* 127*  BUN 7 17 16 16 18   CREATININE 0.87 0.85 1.01* 0.89 0.85  CALCIUM 8.2* 9.1 9.0 9.0 9.4  MG  --   --  1.6* 2.1  --   PHOS  --   --  2.5  --   --    GFR: Estimated Creatinine Clearance: 73.8 mL/min (by C-G formula based on Cr of 0.85). Liver Function Tests:  Recent Labs Lab 08/21/15 0900 08/24/15 1742 08/25/15 0540  AST 13* 28 23  ALT 9* 13* 13*  ALKPHOS 76 115 105  BILITOT 1.0 0.9 0.6  PROT 4.1* 5.1* 4.9*  ALBUMIN 1.3* 1.7* 1.6*   No results for input(s): LIPASE, AMYLASE in the last 168 hours. No results for input(s): AMMONIA in the last 168 hours. Coagulation Profile: No results for input(s): INR, PROTIME in the last 168 hours. Cardiac Enzymes:  Recent Labs Lab 08/24/15 1742  TROPONINI <0.03   BNP (last 3 results) No results for input(s): PROBNP in the last 8760 hours. HbA1C: No results for input(s): HGBA1C in the last 72 hours. CBG:  Recent Labs Lab 08/26/15 1523 08/26/15 2003 08/26/15 2331 08/27/15 0436  08/27/15 0831  GLUCAP 123* 121* 124* 123* 125*   Lipid Profile: No results for input(s): CHOL, HDL, LDLCALC, TRIG, CHOLHDL, LDLDIRECT in the last 72 hours. Thyroid Function Tests: No results for input(s): TSH, T4TOTAL, FREET4, T3FREE, THYROIDAB in the last 72 hours. Anemia Panel: No results for input(s): VITAMINB12, FOLATE, FERRITIN, TIBC, IRON, RETICCTPCT in the last 72 hours. Urine analysis:    Component Value Date/Time   COLORURINE YELLOW 08/24/2015 1842   APPEARANCEUR CLOUDY* 08/24/2015 1842   LABSPEC 1.020 08/24/2015 1842   LABSPEC 1.015 01/20/2015 1521   PHURINE 6.0 08/24/2015 1842   PHURINE 6.0 01/20/2015 1521  GLUCOSEU NEGATIVE 08/24/2015 1842   GLUCOSEU Negative 01/20/2015 1521   HGBUR MODERATE* 08/24/2015 1842   HGBUR Negative 01/20/2015 1521   BILIRUBINUR NEGATIVE 08/24/2015 1842   BILIRUBINUR Negative 01/20/2015 1521   BILIRUBINUR NEG 06/12/2013 [place] 08/24/2015 1842   KETONESUR Negative 01/20/2015 1521   PROTEINUR 100* 08/24/2015 1842   PROTEINUR < 30 01/20/2015 1521   PROTEINUR 30 06/12/2013 0937   UROBILINOGEN 0.2 02/04/2015 1030   UROBILINOGEN 0.2 01/20/2015 1521   UROBILINOGEN 0.2 06/12/2013 0937   NITRITE NEGATIVE 08/24/2015 1842   NITRITE Negative 01/20/2015 1521   NITRITE NEG 06/12/2013 0937   LEUKOCYTESUR MODERATE* 08/24/2015 1842   LEUKOCYTESUR Moderate 01/20/2015 1521   Sepsis Labs: @LABRCNTIP (procalcitonin:4,lacticidven:4) ) Recent Results (from the past 240 hour(s))  Culture, body fluid-bottle     Status: None   Collection Time: 08/17/15  4:01 PM  Result Value Ref Range Status   Specimen Description FLUID ABDOMEN PERITONEAL  Final   Special Requests BOTTLES DRAWN AEROBIC AND ANAEROBIC 10CC  Final   Culture   Final    NO GROWTH 5 DAYS Performed at [facility]    Report Status 08/22/2015 FINAL  Final  Gram stain     Status: None   Collection Time: 08/17/15  4:01 PM  Result Value Ref Range Status   Specimen  Description FLUID ABDOMEN PERITONEAL  Final   Special Requests NONE  Final   Gram Stain   Final    FEW WBC PRESENT, PREDOMINANTLY MONONUCLEAR NO ORGANISMS SEEN Performed at [facility]    Report Status 08/17/2015 FINAL  Final  Urine culture     Status: Abnormal   Collection Time: 08/18/15  9:21 AM  Result Value Ref Range Status   Specimen Description URINE, RANDOM  Final   Special Requests NONE  Final   Culture (A)  Final    <10,000 COLONIES/mL INSIGNIFICANT GROWTH Performed at [facility]    Report Status 08/19/2015 FINAL  Final  Culture, blood (routine x 2)     Status: None (Preliminary result)   Collection Time: 08/24/15  5:30 PM  Result Value Ref Range Status   Specimen Description PORTA CATH  Final   Special Requests BOTTLES DRAWN AEROBIC AND ANAEROBIC 5CC  Final   Culture   Final    NO GROWTH 2 DAYS Performed at [facility]    Report Status PENDING  Incomplete  Urine culture     Status: Abnormal   Collection Time: 08/24/15  6:42 PM  Result Value Ref Range Status   Specimen Description URINE, CATHETERIZED  Final   Special Requests NONE  Final   Culture MULTIPLE SPECIES PRESENT, SUGGEST RECOLLECTION (A)  Final   Report Status 08/26/2015 FINAL  Final  Culture, blood (routine x 2)     Status: None (Preliminary result)   Collection Time: 08/24/15 11:31 PM  Result Value Ref Range Status   Specimen Description BLOOD LEFT ARM  Final   Special Requests IN PEDIATRIC BOTTLE 3CC  Final   Culture   Final    NO GROWTH 1 DAY Performed at [facility]    Report Status PENDING  Incomplete         Radiology Studies: Dg Chest Port 1 View  08/27/2015  CLINICAL DATA:  Respiratory failure. EXAM: PORTABLE CHEST 1 VIEW COMPARISON:  08/26/2015. FINDINGS: PowerPort catheter stable position. Mediastinum hilar structures are stable. Heart size stable. Persistent but slightly improving bilateral airspace disease. No prominent pleural effusion. No  pneumothorax. IMPRESSION: 1.  PowerPort catheter stable position. 2.  Persistent but slightly improving bilateral airspace disease. Electronically Signed   By: Marcello Moores  Register   On: 08/27/2015 07:01   Dg Chest Port 1 View  08/26/2015  CLINICAL DATA:  Patient with acute respiratory failure. EXAM: PORTABLE CHEST 1 VIEW COMPARISON:  Chest radiograph 08/24/2025. FINDINGS: Right anterior chest wall Port-A-Cath is present, stable in position with tip projecting over the right atrium. Multiple monitoring leads overlie the patient. Stable cardiac and mediastinal contours. Grossly unchanged bilateral left-greater-than-right hazy pulmonary opacities. No definite pleural effusion or pneumothorax. Low lung volumes. IMPRESSION: Stable support apparatus. Stable hazy opacities bilaterally which may represent edema or infection. Electronically Signed   By: Lovey Newcomer M.D.   On: 08/26/2015 07:03      Scheduled Meds: . antiseptic oral rinse  7 mL Mouth Rinse BID  . ceFEPime (MAXIPIME) IV  1 g Intravenous Q8H  . dronabinol  5 mg Oral TID  . DULoxetine  30 mg Oral Daily  . enoxaparin (LOVENOX) injection  40 mg Subcutaneous Q24H  . feeding supplement  1 Container Oral TID BM  . hydroxychloroquine  400 mg Oral QHS  . insulin aspart  0-9 Units Subcutaneous TID WC  . methylPREDNISolone (SOLU-MEDROL) injection  60 mg Intravenous Q6H   Continuous Infusions:    LOS: 3 days    Time spent in minutes: 85    Dillard, MD [facility] Pager: [email or web address] Password [facility] 08/27/2015, 9:53 AM

## 2015-08-27 NOTE — Progress Notes (Signed)
Daily Progress Note   Patient Name: Andrea Best       Date: 08/27/2015 DOB: 11/28/1970  Age: 45 y.o. MRN#: [id number] Attending Physician: Debbe Odea, MD Primary Care Physician: Aretta Nip, MD Admit Date: 08/24/2015  Reason for Consultation/Follow-up: Establishing goals of care  Subjective: Off BiPAP , on [place].  In no distress Appears chronically ill See below    Length of Stay: 3  Current Medications: Scheduled Meds:  . amoxicillin-clavulanate  1 tablet Oral Q12H  . antiseptic oral rinse  7 mL Mouth Rinse BID  . dronabinol  5 mg Oral TID  . DULoxetine  30 mg Oral Daily  . enoxaparin (LOVENOX) injection  40 mg Subcutaneous Q24H  . feeding supplement  1 Container Oral TID BM  . hydroxychloroquine  400 mg Oral QHS  . insulin aspart  0-9 Units Subcutaneous TID WC  . [START ON 08/28/2015] predniSONE  60 mg Oral Q breakfast   Followed by  . [START ON 09/02/2015] predniSONE  50 mg Oral Q breakfast   Followed by  . [START ON 09/07/2015] predniSONE  40 mg Oral Q breakfast   Followed by  . [START ON 09/12/2015] predniSONE  30 mg Oral Q breakfast   Followed by  . [START ON 09/17/2015] predniSONE  20 mg Oral Q breakfast   Followed by  . [START ON 09/22/2015] predniSONE  10 mg Oral Q breakfast    Continuous Infusions:    PRN Meds: bisacodyl, diphenoxylate-atropine, morphine injection, morphine CONCENTRATE, ondansetron (ZOFRAN) IV  Physical Exam         Thin cachectic female NAD S1 S2 Poor inspiratory effort,   Abdomen soft mildly distended No edema Awake, some what alert.   Vital Signs: BP 127/89 mmHg  Pulse 105  Temp(Src) 97.3 F (36.3 C) (Oral)  Resp 26  Ht 5\' 5"  (1.651 m)  Wt 54.2 kg (119 lb 7.8 oz)  BMI 19.88 kg/m2  SpO2 100% SpO2: SpO2: 100 % O2  Device: O2 Device: Nasal Cannula O2 Flow Rate: O2 Flow Rate (L/min): 3 L/min  Intake/output summary:   Intake/Output Summary (Last 24 hours) at 08/27/15 1402 Last data filed at 08/27/15 0800  Gross per 24 hour  Intake   1100 ml  Output    210 ml  Net    890 ml  LBM:   Baseline Weight: Weight: 54.2 kg (119 lb 7.8 oz) Most recent weight: Weight: 54.2 kg (119 lb 7.8 oz)       Palliative Assessment/Data:    Flowsheet Rows        Most Recent Value   Intake Tab    Referral Department  Critical care   Unit at Time of Referral  ICU   Palliative Care Primary Diagnosis  Cancer   Date Notified  08/24/15   Palliative Care Type  Return patient Palliative Care   Reason for referral  Clarify Goals of Care   Date of Admission  08/24/15   Date first seen by Palliative Care  08/25/15   # of days Palliative referral response time  1 Day(s)   # of days IP prior to Palliative referral  0   Clinical Assessment    Palliative Performance Scale Score  30%   Pain Max last 24 hours  5   Pain Min Last 24 hours  4   Dyspnea Max Last 24 Hours  7   Dyspnea Min Last 24 hours  6   Psychosocial & Spiritual Assessment    Palliative Care Outcomes    Patient/Family meeting held?  Yes   Who was at the meeting?  patient mother    Palliative Care follow-up planned  Yes, Home      Patient Active Problem List   Diagnosis Date Noted  . Encounter for palliative care   . ARDS (adult respiratory distress syndrome) ([place]) 08/24/2015  . Acute respiratory failure with hypoxia ([place]) 08/24/2015  . Acute respiratory failure ([place]) 08/24/2015  . Peritoneal carcinoma ([place]) 08/22/2015  . Acute encephalopathy 08/21/2015  . Neoplasm related pain   . Palliative care encounter   . Goals of care, counseling/discussion   . Abdominal distention   . Neutropenic fever ([place]) 08/16/2015  . Sepsis ([place]) 08/16/2015  . Occult blood in stools 08/16/2015  . Pancytopenia ([place]) 08/16/2015  . Fever and neutropenia ([place])  08/16/2015  . Weakness 06/03/2015  . Antineoplastic chemotherapy induced anemia 06/03/2015  . Ascites 06/03/2015  . Hypotension 06/03/2015  . Peripheral edema 05/29/2015  . Dehydration 05/29/2015  . Cancer associated pain 03/03/2015  . Genetic testing 02/25/2015  . Hydronephrosis with renal and ureteral calculus obstruction   . Malignant neoplasm of stomach ([place])   . Abdominal pain, generalized   . Dry eyes   . E coli bacteremia   . Bacteremia due to Klebsiella pneumoniae   . 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 Bacteremia   . Malnutrition of moderate degree 01/07/2015  . Arterial hypotension   . Biliary obstruction   . Weight loss 11/13/2014  . Diarrhea 11/13/2014  . URI (upper respiratory infection) 12/15/2013  . Hand foot syndrome 12/15/2013  . 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 Anorexia 12/15/2013  . 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 Candida tropicalis infection 10/27/2013  . HAP (hospital-acquired pneumonia) 10/27/2013  . Leucocytosis 10/20/2013  . HCAP (healthcare-associated pneumonia) 10/20/2013  . Fever, unspecified 10/12/2013  . Tachycardia 10/12/2013  . Hypomagnesemia 10/11/2013  . Protein-calorie malnutrition, severe ([place]) 10/09/2013  . Gastric outlet obstruction 10/08/2013  . Anemia, iron deficiency 10/08/2013  . Gastric cancer ([place]) 10/07/2013  . Stomach pain 06/12/2013  . Joint pain 07/28/2011    Palliative Care Assessment & Plan   Patient Profile:  45 yo with metastatic gastric cancer, recently admitted for neutropenic fever, received palliative chemo at [facility] of [place], now  admitted with resp failure.   Assessment:  gastric cancer resp failure Possible PNA versus ARDS   Recommendations/Plan:  Goals of Care and Additional Recommendations:   FAMILY MEETING WITH MOTHER:   Appreciate Dr. Anastasia Pall input. Followed up with mother. She would like to discuss specifically about DNI with  the patient's only child a 43 year old daughter who will be meeting with the patient's mother this evening. She also wants to discuss with the patient's brother.    Palliative will continue gentle conversations with the patient's mother, continue to follow disease trajectory.    Code Status:    Code Status Orders        Start     Ordered   08/24/15 2250  Partial; 08/24/15 2249    Code Status History    Date Active Date Inactive Code Status Order ID Comments User Context   08/24/2015  9:12 PM 08/24/2015 10:49 PM DNR [id number]  Etta Quill, DO ED   08/20/2015 11:48 AM 08/22/2015  2:22 PM Full Code [id number]  Micheline Rough, MD Inpatient   01/20/2015  6:16 PM 02/05/2015  2:55 PM Full Code [id number]  Hosie Poisson, MD Inpatient   01/06/2015  2:43 PM 01/11/2015  7:17 PM Full Code [id number]  Erick Colace, NP ED   06/24/2014  1:29 PM 06/25/2014  3:43 AM Full Code [id number]  Greggory Keen, MD HOV   10/21/2013 11:16 AM 10/27/2013  8:14 PM Full Code [id number]  Marybelle Killings, MD Inpatient   10/08/2013 11:25 PM 10/21/2013 11:16 AM Full Code [id number]  Rise Patience, MD Inpatient    Advance Directive Documentation        Most Recent Value   Type of Advance Directive  Healthcare Power of Attorney, Living will   Pre-existing out of facility DNR order (yellow form or pink MOST form)     "MOST" Form in Place?         Prognosis:   guarded  Discharge Planning:  Likely home.   Care plan was discussed with patient, mother.   Thank you for allowing the Palliative Medicine Team to assist in the care of this patient.   Time In: 1 Time Out: 1:25 Total Time 25  Prolonged Time Billed  no       Greater than 50%  of this time was spent counseling and coordinating care related to the above assessment and plan.  Loistine Chance, MD [phone]  Please contact Palliative Medicine Team phone at [phone] for questions and concerns.

## 2015-08-27 NOTE — Progress Notes (Signed)
Date:  August 27, 2015 Chart reviewed for concurrent status and case management needs. Will continue to follow the patient for changes and needs:  Continues to require intermittent bi-pap Expected discharge date: [id number] Andrea Best, [place], [place], [place]

## 2015-08-28 DIAGNOSIS — Z531 Procedure and treatment not carried out because of patient's decision for reasons of belief and group pressure: Secondary | ICD-10-CM

## 2015-08-28 LAB — CBC
HEMATOCRIT: 23.7 % — AB (ref 36.0–46.0)
Hemoglobin: 7.5 g/dL — ABNORMAL LOW (ref 12.0–15.0)
MCH: 29.1 pg (ref 26.0–34.0)
MCHC: 31.6 g/dL (ref 30.0–36.0)
MCV: 91.9 fL (ref 78.0–100.0)
PLATELETS: 109 10*3/uL — AB (ref 150–400)
RBC: 2.58 MIL/uL — ABNORMAL LOW (ref 3.87–5.11)
RDW: 28.8 % — AB (ref 11.5–15.5)
WBC: 36.1 10*3/uL — AB (ref 4.0–10.5)

## 2015-08-28 LAB — BASIC METABOLIC PANEL
ANION GAP: 3 — AB (ref 5–15)
BUN: 20 mg/dL (ref 6–20)
CALCIUM: 9.3 mg/dL (ref 8.9–10.3)
CO2: 24 mmol/L (ref 22–32)
CREATININE: 0.68 mg/dL (ref 0.44–1.00)
Chloride: 111 mmol/L (ref 101–111)
Glucose, Bld: 99 mg/dL (ref 65–99)
Potassium: 3.4 mmol/L — ABNORMAL LOW (ref 3.5–5.1)
Sodium: 138 mmol/L (ref 135–145)

## 2015-08-28 LAB — GLUCOSE, CAPILLARY
GLUCOSE-CAPILLARY: 107 mg/dL — AB (ref 65–99)
Glucose-Capillary: 99 mg/dL (ref 65–99)

## 2015-08-28 MED ORDER — HEPARIN SOD (PORK) LOCK FLUSH 100 UNIT/ML IV SOLN
500.0000 [IU] | INTRAVENOUS | Status: AC | PRN
  Administered 2015-08-28: 500 [IU]

## 2015-08-28 MED ORDER — SODIUM CHLORIDE 0.9% FLUSH
10.0000 mL | INTRAVENOUS | Status: DC | PRN
  Administered 2015-08-28: 10 mL
  Filled 2015-08-28: qty 40

## 2015-08-28 MED ORDER — DULOXETINE HCL 30 MG PO CPEP: 30.0000 mg | ORAL_CAPSULE | Freq: Every day | ORAL | Status: AC

## 2015-08-28 MED ORDER — DRONABINOL 5 MG PO CAPS: 5.0000 mg | ORAL_CAPSULE | Freq: Three times a day (TID) | ORAL | Status: DC

## 2015-08-28 MED ORDER — AMOXICILLIN-POT CLAVULANATE 875-125 MG PO TABS: 1.0000 | ORAL_TABLET | Freq: Two times a day (BID) | ORAL | Status: AC

## 2015-08-28 MED ORDER — BOOST / RESOURCE BREEZE PO LIQD: 1.0000 | Freq: Three times a day (TID) | ORAL | Status: AC

## 2015-08-28 MED ORDER — POLYETHYLENE GLYCOL 3350 17 G PO PACK: 17.0000 g | PACK | Freq: Every day | ORAL | Status: AC | PRN

## 2015-08-28 MED ORDER — SACCHAROMYCES BOULARDII 250 MG PO CAPS
250.0000 mg | ORAL_CAPSULE | Freq: Two times a day (BID) | ORAL | Status: AC
Start: 2015-08-28 — End: ?

## 2015-08-28 MED ORDER — DRONABINOL 5 MG PO CAPS: 5.0000 mg | ORAL_CAPSULE | Freq: Three times a day (TID) | ORAL | Status: AC

## 2015-08-28 MED ORDER — PREDNISONE 10 MG PO TABS: ORAL_TABLET | ORAL | Status: AC

## 2015-08-28 MED ORDER — VITAMINS A & D EX OINT
TOPICAL_OINTMENT | CUTANEOUS | Status: AC
  Administered 2015-08-28: 1
  Filled 2015-08-28: qty 5

## 2015-08-28 MED ORDER — MORPHINE SULFATE (CONCENTRATE) 20 MG/ML PO SOLN: ORAL | Status: AC

## 2015-08-28 NOTE — Care Management Note (Addendum)
Case Management Note  Patient Details  Name: Andrea Best MRN: [id number] Date of Birth: 1970-05-10  Subjective/Objective:                  Short of breath  Action/Plan: CM spoke with patient and her mother at the bedside. Patient's mother selected [facility] for HHPT, nursing, HHA's and respiratory. Andrea Best at [facility] notified of the referral for home care and discharge date of today. CM paged Dr. Wynelle Cleveland for clarification on the type of bed ordered for the patient. Andrea Best at [facility] notified of the DME referral, awaiting clarification. Patient's mother can be reached to schedule home care visits and DME delivery, Andrea Best, cell [phone], home [phone].   1:16 - DME clarified with Dr. Wynelle Cleveland. Patient needs a hospital bed. Andrea Sheffield RN BSN CCM   Expected Discharge Date:  08/28/15               Expected Discharge Plan:  [place]  In-House Referral:  Clinical Social Work  Discharge planning Services  CM Consult  Post Acute Care Choice:  Home Health Choice offered to:  Parent, Patient  DME Arranged:  Hospital bed DME Agency:  [place]:   RN, HHA, PT, Respiratory [place] Agency:   [place]  Status of Service:     Medicare Important Message Given:    Date Medicare IM Given:    Medicare IM give by:    Date Additional Medicare IM Given:    Additional Medicare Important Message give by:     If discussed at [place] of Stay Meetings, dates discussed:    Additional Comments:  Andrea Schneiders, RN 08/28/2015, 12:46 PM

## 2015-08-28 NOTE — Care Management (Signed)
[facility] called for transport home. Presenter, broadcasting BSN CCM

## 2015-08-28 NOTE — Progress Notes (Deleted)
PROGRESS NOTE    Andrea Best  [id number] DOB: 01/08/1971 DOA: 08/24/2015  PCP: Aretta Nip, MD   Brief Narrative:  45 year old female with stage IV gastric cancer diagnosed in 2015 currently receiving chemotherapy, SLE Sjogren's syndrome on treatment. She presented to the hospital with acute respiratory failure and was treated with BiPAP. Pulmonary critical care consult was requested. She has been weaned down to 2 L of oxygen and transferred to the [facility] today.  Subjective: Patient is slightly confused. Mother is at bedside assisting with history. She states that she is on cycle 4 out of 6 of chemotherapy after which she will have a PET scan. She would like to take her home and would like to be aggressive with her nutrition to see if she can resume chemotherapy in the near future. The patient has no complaints of shortness of breath cough nausea vomiting or constipation.  Assessment & Plan:   Principal Problem:   ARDS / acute resp failure with hypoxia/ possible HCAP -Pulmonary critical care has a high suspicion that she has ARDS/pneumonitis from drug treatment -plan to treat with steroids but also treating for possible HCAP as she has been neutropenic from chemo and to ensure no other etiology has been missed -Assess O2 needs for home  Active Problems:   Gastric cancer stage 4 - s/p gastrectomy with ascites and abdominal metastasis -Is seen at [facility] in [place] for treatment plans -receives palliative chemotherapy here at the cancer center in [place] - mother wants patient to be a partial code- patient is not oriented enough to make this decision for herself - chemo on hold "until she gets stronger"     Protein-calorie malnutrition, severe - anorexia -Breeze 3 times a day in addition to whatever else she can eat -Protein supplement twice a day - Marinol TID - prevent constipation from narcotics  Constipation - Dulcolax  suppository    Antineoplastic chemotherapy induced anemia - Jehovah's witness- check anemia panel  Leukocytosis - result of steroids and granix   Lupus, Sjogren's syndrome - cont Plaquenil    DVT prophylaxis: Lovenox Code Status: full code Family Communication: mother Disposition Plan: home in 1-2 days Consultants:   PCCM  Palliative care Procedures:   none Antimicrobials:  Anti-infectives    Start     Dose/Rate Route Frequency Ordered Stop   08/27/15 1100  amoxicillin-clavulanate (AUGMENTIN) 875-125 MG per tablet 1 tablet     1 tablet Oral Every 12 hours 08/27/15 1053     08/25/15 0800  vancomycin (VANCOCIN) IVPB 750 mg/150 ml premix  Status:  Discontinued     750 mg 150 mL/hr over 60 Minutes Intravenous Every 12 hours 08/24/15 1934 08/26/15 1210   08/25/15 0400  ceFEPIme (MAXIPIME) 1 g in dextrose 5 % 50 mL IVPB  Status:  Discontinued     1 g 100 mL/hr over 30 Minutes Intravenous Every 8 hours 08/24/15 2114 08/27/15 1053   08/24/15 2200  hydroxychloroquine (PLAQUENIL) tablet 400 mg     400 mg Oral Daily at bedtime 08/24/15 2106     08/24/15 1930  ceFEPIme (MAXIPIME) 1 g in dextrose 5 % 50 mL IVPB     1 g 100 mL/hr over 30 Minutes Intravenous  Once 08/24/15 1909 08/24/15 2002   08/24/15 1930  vancomycin (VANCOCIN) IVPB 1000 mg/200 mL premix     1,000 mg 200 mL/hr over 60 Minutes Intravenous STAT 08/24/15 1920 08/24/15 2203       Objective: Danley Danker  Vitals:   08/28/15 0600 08/28/15 0700 08/28/15 0734 08/28/15 0800  BP: 119/90 120/83  121/83  Pulse: 112 109  109  Temp:   97.5 F (36.4 C)   TempSrc:   Oral   Resp: 27 27  24   Height:      Weight:      SpO2: 96% 97%  96%    Intake/Output Summary (Last 24 hours) at 08/28/15 0929 Last data filed at 08/28/15 [id number]  Gross per 24 hour  Intake    195 ml  Output    395 ml  Net   -200 ml   Filed Weights   08/26/15 0800  Weight: 54.2 kg (119 lb 7.8 oz)    Examination: General exam: Appears comfortable,  cachectic, almost inaudible voice HEENT: PERRLA, oral mucosa moist, no sclera icterus or thrush Respiratory system: Clear to auscultation- very poor air entry- no taking deep breaths- Respiratory effort normal. 100 % on 2 L Cardiovascular system: S1 & S2 heard, RRR.  No murmurs  Gastrointestinal system: Abdomen soft, non-tender,  distended. Normal bowel sound. No organomegaly Central nervous system: Alert and oriented. No focal neurological deficits. Extremities: No cyanosis, clubbing or edema Skin: No rashes or ulcers Psychiatry:  Mood & affect appropriate.     Data Reviewed: I have personally reviewed following labs and imaging studies  CBC:  Recent Labs Lab 08/24/15 1742 08/25/15 0540 08/26/15 0500 08/27/15 0505 08/28/15 0206  WBC 36.9* 28.5* 36.7* 45.3* 36.1*  NEUTROABS 33.6*  --   --   --   --   HGB 7.3* 7.4* 7.8* 7.7* 7.5*  HCT 22.0* 22.7* 23.4* 23.8* 23.7*  MCV 87.3 88.0 88.3 90.5 91.9  PLT 124* 114* 102* 114* 0000000*   Basic Metabolic Panel:  Recent Labs Lab 08/24/15 1742 08/25/15 0540 08/26/15 0500 08/27/15 0505 08/28/15 0206  NA 140 141 140 140 138  K 3.2* 3.1* 4.0 3.9 3.4*  CL 117* 116* 114* 114* 111  CO2 19* 22 21* 21* 24  GLUCOSE 120* 138* 139* 127* 99  BUN 17 16 16 18 20   CREATININE 0.85 1.01* 0.89 0.85 0.68  CALCIUM 9.1 9.0 9.0 9.4 9.3  MG  --  1.6* 2.1  --   --   PHOS  --  2.5  --   --   --    GFR: Estimated Creatinine Clearance: 78.4 mL/min (by C-G formula based on Cr of 0.68). Liver Function Tests:  Recent Labs Lab 08/24/15 1742 08/25/15 0540  AST 28 23  ALT 13* 13*  ALKPHOS 115 105  BILITOT 0.9 0.6  PROT 5.1* 4.9*  ALBUMIN 1.7* 1.6*   No results for input(s): LIPASE, AMYLASE in the last 168 hours. No results for input(s): AMMONIA in the last 168 hours. Coagulation Profile: No results for input(s): INR, PROTIME in the last 168 hours. Cardiac Enzymes:  Recent Labs Lab 08/24/15 1742  TROPONINI <0.03   BNP (last 3 results) No  results for input(s): PROBNP in the last 8760 hours. HbA1C: No results for input(s): HGBA1C in the last 72 hours. CBG:  Recent Labs Lab 08/27/15 0436 08/27/15 0831 08/27/15 1210 08/27/15 2117 08/28/15 0751  GLUCAP 123* 125* 159* 86 99   Lipid Profile: No results for input(s): CHOL, HDL, LDLCALC, TRIG, CHOLHDL, LDLDIRECT in the last 72 hours. Thyroid Function Tests: No results for input(s): TSH, T4TOTAL, FREET4, T3FREE, THYROIDAB in the last 72 hours. Anemia Panel:  Recent Labs  08/27/15 0900  VITAMINB12 2690*  FOLATE 10.3  FERRITIN 1174*  TIBC 123*  IRON 59  RETICCTPCT 6.6*   Urine analysis:    Component Value Date/Time   COLORURINE YELLOW 08/24/2015 1842   APPEARANCEUR CLOUDY* 08/24/2015 1842   LABSPEC 1.020 08/24/2015 1842   LABSPEC 1.015 01/20/2015 1521   PHURINE 6.0 08/24/2015 1842   PHURINE 6.0 01/20/2015 1521   GLUCOSEU NEGATIVE 08/24/2015 1842   GLUCOSEU Negative 01/20/2015 1521   HGBUR MODERATE* 08/24/2015 1842   HGBUR Negative 01/20/2015 1521   BILIRUBINUR NEGATIVE 08/24/2015 1842   BILIRUBINUR Negative 01/20/2015 1521   BILIRUBINUR NEG 06/12/2013 0937   KETONESUR NEGATIVE 08/24/2015 1842   KETONESUR Negative 01/20/2015 1521   PROTEINUR 100* 08/24/2015 1842   PROTEINUR < 30 01/20/2015 1521   PROTEINUR 30 06/12/2013 0937   UROBILINOGEN 0.2 02/04/2015 1030   UROBILINOGEN 0.2 01/20/2015 1521   UROBILINOGEN 0.2 06/12/2013 0937   NITRITE NEGATIVE 08/24/2015 1842   NITRITE Negative 01/20/2015 1521   NITRITE NEG 06/12/2013 0937   LEUKOCYTESUR MODERATE* 08/24/2015 1842   LEUKOCYTESUR Moderate 01/20/2015 1521   Sepsis Labs: @LABRCNTIP (procalcitonin:4,lacticidven:4) ) Recent Results (from the past 240 hour(s))  Culture, blood (routine x 2)     Status: None (Preliminary result)   Collection Time: 08/24/15  5:30 PM  Result Value Ref Range Status   Specimen Description PORTA CATH  Final   Special Requests BOTTLES DRAWN AEROBIC AND ANAEROBIC 5CC  Final     Culture   Final    NO GROWTH 3 DAYS Performed at [facility]    Report Status PENDING  Incomplete  Urine culture     Status: Abnormal   Collection Time: 08/24/15  6:42 PM  Result Value Ref Range Status   Specimen Description URINE, CATHETERIZED  Final   Special Requests NONE  Final   Culture MULTIPLE SPECIES PRESENT, SUGGEST RECOLLECTION (A)  Final   Report Status 08/26/2015 FINAL  Final  Culture, blood (routine x 2)     Status: None (Preliminary result)   Collection Time: 08/24/15 11:31 PM  Result Value Ref Range Status   Specimen Description BLOOD LEFT ARM  Final   Special Requests IN PEDIATRIC BOTTLE 3CC  Final   Culture   Final    NO GROWTH 2 DAYS Performed at [facility]    Report Status PENDING  Incomplete         Radiology Studies: Dg Chest Port 1 View  08/27/2015  CLINICAL DATA:  Respiratory failure. EXAM: PORTABLE CHEST 1 VIEW COMPARISON:  08/26/2015. FINDINGS: PowerPort catheter stable position. Mediastinum hilar structures are stable. Heart size stable. Persistent but slightly improving bilateral airspace disease. No prominent pleural effusion. No pneumothorax. IMPRESSION: 1.  PowerPort catheter stable position. 2.  Persistent but slightly improving bilateral airspace disease. Electronically Signed   By: Marcello Moores  Register   On: 08/27/2015 07:01      Scheduled Meds: . amoxicillin-clavulanate  1 tablet Oral Q12H  . antiseptic oral rinse  7 mL Mouth Rinse BID  . dronabinol  5 mg Oral TID  . DULoxetine  30 mg Oral Daily  . enoxaparin (LOVENOX) injection  40 mg Subcutaneous Q24H  . feeding supplement  1 Container Oral TID BM  . hydroxychloroquine  400 mg Oral QHS  . insulin aspart  0-9 Units Subcutaneous TID WC  . predniSONE  60 mg Oral Q breakfast   Followed by  . [START ON 09/02/2015] predniSONE  50 mg Oral Q breakfast   Followed by  . [START ON 09/07/2015] predniSONE  40 mg Oral Q breakfast  Followed by  . [START ON 09/12/2015] predniSONE  30  mg Oral Q breakfast   Followed by  . [START ON 09/17/2015] predniSONE  20 mg Oral Q breakfast   Followed by  . [START ON 09/22/2015] predniSONE  10 mg Oral Q breakfast  . vitamin A & D       Continuous Infusions:    LOS: 4 days    Time spent in minutes: 46    [place], MD [facility] Pager: [email or web address] Password [email or web address] 08/28/2015, 9:29 AM

## 2015-08-28 NOTE — Progress Notes (Signed)
D/C home via EMS

## 2015-08-28 NOTE — Discharge Summary (Addendum)
Physician Discharge Summary  Andrea Best [id number] DOB: 03-Oct-1970 DOA: 08/24/2015  PCP: Andrea Nip, MD  Admit date: 08/24/2015 Discharge date: 08/28/2015  Time spent: 60 minutes  Partial Code- Intubate if needed- no CPR Will go home with home health- Poor Prognosis- Mother is aware  Discharge Condition: stable    Discharge Diagnoses:  Principal Problem:   ARDS (adult respiratory distress syndrome) ([place]) Active Problems:   Gastric cancer ([place])   Protein-calorie malnutrition, severe (HCC)   Antineoplastic chemotherapy induced anemia   Acute respiratory failure with hypoxia (HCC)   Acute respiratory failure ([place])   Encounter for palliative care   Refusal of blood transfusions as patient is Jehovah's Witness   Brief Narrative:  45 year old female with stage IV gastric cancer diagnosed in 2015 currently receiving chemotherapy, SLE Sjogren's syndrome on treatment. She presented to the hospital with acute respiratory failure and was treated with BiPAP. Pulmonary critical care consult was requested. She has been weaned off on O2 as of this AM. Mother has been at bedside assisting with history. She states that she is on cycle 4 out of 6 of chemotherapy after which she will have a PET scan. She would like to take her home and would like to be aggressive with her nutrition to see if she can resume chemotherapy in the near future. The patient has no complaints of shortness of breath cough nausea vomiting or constipation.  Assessment & Plan:   Principal Problem:   ARDS / acute resp failure with hypoxia/ possible HCAP -Pulmonary critical care has a high suspicion that she has ARDS/pneumonitis from drug treatment -plan to treat with steroids but also treating for possible HCAP as she has been neutropenic from chemo and to ensure no other etiology has been missed - on Augmentin (will give 10 days worth with Florastor) and slow prednisone taper now - no home O2 needs   Active  Problems:   Gastric cancer stage 4 -Is seen at [facility] in [place] for treatments - mother wants patient to be a Partial Code after extensive discussions with PCCM and Palliative care- patient is not oriented enough to make this decision for herself    Protein-calorie malnutrition, severe - anorexia -Breeze 3 times a day in addition to whatever else she can eat -Protein supplement twice a day - Marinol TID - prevent constipation from narcotics  Constipation - Dulcolax suppository  Antineoplastic chemotherapy induced anemia - Jehovah's witness- checked anemia panel- no deficiencies noted  Leukocytosis - result of steroids and granix   Lupus, Sjogren's syndrome - cont Plaquenil   Procedures:  none (i.e. Studies not automatically included, echos, thoracentesis, etc; not x-rays)  Consultations:  PCCM  Palliative care  Discharge Exam: Filed Weights   08/26/15 0800  Weight: 54.2 kg (119 lb 7.8 oz)   Filed Vitals:   08/28/15 0734 08/28/15 0800  BP:  121/83  Pulse:  109  Temp: 97.5 F (36.4 C)   Resp:  24    General: AAO x 3, no distress Cardiovascular: RRR, no murmurs  Respiratory: clear to auscultation bilaterally GI: soft, non-tender, non-distended, bowel sound positive  Discharge Instructions You were cared for by a hospitalist during your hospital stay. If you have any questions about your discharge medications or the care you received while you were in the hospital after you are discharged, you can call the unit and asked to speak with the hospitalist on call if the hospitalist that took care of you is not available. Once  you are discharged, your primary care physician will handle any further medical issues. Please note that NO REFILLS for any discharge medications will be authorized once you are discharged, as it is imperative that you return to your primary care physician (or establish a relationship with a primary care physician if you do not have  one) for your aftercare needs so that they can reassess your need for medications and monitor your lab values.      Discharge Instructions    Discharge instructions    Complete by:  As directed   Regular, high protein, high calorie diet     Increase activity slowly    Complete by:  As directed             Medication List    TAKE these medications        amoxicillin-clavulanate 875-125 MG tablet  Commonly known as:  AUGMENTIN  Take 1 tablet by mouth every 12 (twelve) hours.     diphenoxylate-atropine 2.5-0.025 MG tablet  Commonly known as:  LOMOTIL  Take 1 tablet by mouth every 4 (four) hours as needed. diarrhea     dronabinol 5 MG capsule  Commonly known as:  MARINOL  Take 1 capsule (5 mg total) by mouth 3 (three) times daily before meals. n/v     DULoxetine 30 MG capsule  Commonly known as:  CYMBALTA  Take 30 mg by mouth daily.     feeding supplement Liqd  Take 1 Container by mouth 3 (three) times daily between meals.     ferrous sulfate 325 (65 FE) MG tablet  Take 325 mg by mouth 3 (three) times a week.     hydroxychloroquine 200 MG tablet  Commonly known as:  PLAQUENIL  Take 1 tablet (200 mg total) by mouth 2 (two) times daily.     morphine 20 MG/ML concentrated solution  Commonly known as:  ROXANOL  Take 1/2 ml to 88ml every 4 hours as need for pain     MULTIVITAMIN & MINERAL PO  Take 1 tablet by mouth daily.     ondansetron 4 MG tablet  Commonly known as:  ZOFRAN  Take 1 tablet (4 mg total) by mouth every 8 (eight) hours as needed for nausea or vomiting.     polyethylene glycol packet  Commonly known as:  MIRALAX  Take 17 g by mouth daily as needed for mild constipation.     potassium chloride SA 20 MEQ tablet  Commonly known as:  K-DUR,KLOR-CON  Take 1 tablet (20 mEq total) by mouth daily.     predniSONE 10 MG tablet  Commonly known as:  DELTASONE  60 mg- taper by 10 mg every 5 days until finished.     VITAMIN D-3 PO  Take 5 mLs by mouth daily.        Allergies  Allergen Reactions  . Compazine [Prochlorperazine Maleate] Other (See Comments)    Dystonia  . Azithromycin Rash  . Sulfa Antibiotics Rash    hallucinations  . Hydrocodone-Acetaminophen Nausea And Vomiting    Hallucinating   . 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 SHE DOES NOT WANT ANY BLOOD transfusions but will accept Albumin,       The results of significant diagnostics from this hospitalization (including imaging, microbiology, ancillary and laboratory) are listed below for reference.    Significant Diagnostic Studies: Ct Abdomen Pelvis W Contrast  08/17/2015  CLINICAL DATA:  Acute onset of generalized abdominal distention and diarrhea. Nausea, vomiting  and fever. Neutropenia. Initial encounter. EXAM: CT ABDOMEN AND PELVIS WITH CONTRAST TECHNIQUE: Multidetector CT imaging of the abdomen and pelvis was performed using the standard protocol following bolus administration of intravenous contrast. CONTRAST:  2mL ISOVUE-300 IOPAMIDOL (ISOVUE-300) INJECTION 61% COMPARISON:  CT of the chest, abdomen and pelvis performed 05/14/2015 FINDINGS: Bibasilar airspace opacities may reflect atelectasis or pneumonia. Large volume ascites is noted filling the abdomen and pelvis. Areas of vaguely decreased attenuation are noted within the liver, of uncertain significance. The spleen is borderline normal in size. Diffuse pneumobilia is noted, with a common bile duct stent noted in expected position. The gallbladder is relatively decompressed and not well assessed due to surrounding ascites. Vague masslike density is noted about the pancreatic head, with surrounding postoperative change. The adrenal glands are grossly unremarkable in appearance Underlying splenic and gastric varices are noted. Postoperative change is noted at the stomach. Known metastatic disease along the omentum is difficult to fully assess due to compression by ascites. A right ureteral stent is noted. There is mild right  renal atrophy. The kidneys are otherwise unremarkable. No significant hydronephrosis is seen. No renal or ureteral stones are identified. Evaluation for perinephric stranding is limited due to surrounding ascites. There is diffuse wall thickening involving much of the distal and terminal ileum, concerning for acute infectious or inflammatory ileitis. The remainder of the small bowel is grossly unremarkable. The stomach is within normal limits. No acute vascular abnormalities are seen. The appendix is not definitely seen; there is no evidence of appendicitis. The colon is grossly unremarkable in appearance. Vague soft tissue inflammation is noted about the rectum, of uncertain significance. The bladder is moderately distended and grossly unremarkable. The uterus is grossly unremarkable in appearance. Bilateral cystic lesions are noted at the ovaries, measuring 7.6 cm on the right and 4.2 cm on the left, of uncertain significance. No inguinal lymphadenopathy is seen. No acute osseous abnormalities are identified. IMPRESSION: 1. Large volume ascites noted filling the abdomen and pelvis, markedly increased from the prior study. 2. Diffuse wall thickening noted along the distal and terminal ileum, concerning for acute infectious or inflammatory ileitis. 3. Known metastatic disease is difficult to fully assess given the extent of underlying ascites. 4. Vague masslike density about the pancreatic head, with surrounding postoperative change. Common bile duct stent noted in unchanged position, with underlying pneumobilia. 5. Bibasilar airspace opacities may reflect atelectasis or pneumonia. 6. Areas of vaguely decreased attenuation within the liver, of uncertain significance. Underlying metastatic disease cannot be excluded. 7. Mild right renal atrophy noted. 8. Vague nonspecific soft tissue inflammation about the rectum. 9. Bilateral cystic foci again noted at the ovaries, which could reflect metastatic disease to the  ovaries from the patient's gastric primary, though the appearance is nonspecific. This is somewhat larger on the right and smaller on the left. Electronically Signed   By: Garald Balding M.D.   On: 08/17/2015 03:51   US Paracentesis  08/17/2015  INDICATION: History of metastatic gastric cancer with abdominal ascites. Request has been made for diagnostic and therapeutic paracentesis. EXAM: ULTRASOUND GUIDED DIAGNOSTIC AND THERAPEUTIC PARACENTESIS MEDICATIONS: 1% lidocaine COMPLICATIONS: None immediate. PROCEDURE: Informed written consent was obtained from the patient after a discussion of the risks, benefits and alternatives to treatment. A timeout was performed prior to the initiation of the procedure. Initial ultrasound scanning demonstrates a moderate amount of ascites within the left lower abdominal quadrant. The left lower abdomen was prepped and draped in the usual sterile fashion. 1% lidocaine was used  for local anesthesia. Following this, a 19 gauge, 7-cm, Yueh catheter was introduced. An ultrasound image was saved for documentation purposes. The paracentesis was performed. The catheter was removed and a dressing was applied. The patient tolerated the procedure well without immediate post procedural complication. FINDINGS: A total of approximately 3.3 L of clear yellow fluid was removed. Samples were sent to the laboratory as requested by the clinical team. IMPRESSION: Successful ultrasound-guided paracentesis yielding 3.3 liters of peritoneal fluid. Read by: Saverio Danker, PA-C Electronically Signed   By: Corrie Mckusick D.O.   On: 08/17/2015 16:18   Dg Chest Port 1 View  08/27/2015  CLINICAL DATA:  Respiratory failure. EXAM: PORTABLE CHEST 1 VIEW COMPARISON:  08/26/2015. FINDINGS: PowerPort catheter stable position. Mediastinum hilar structures are stable. Heart size stable. Persistent but slightly improving bilateral airspace disease. No prominent pleural effusion. No pneumothorax. IMPRESSION: 1.   PowerPort catheter stable position. 2.  Persistent but slightly improving bilateral airspace disease. Electronically Signed   By: Marcello Moores  Register   On: 08/27/2015 07:01   Dg Chest Port 1 View  08/26/2015  CLINICAL DATA:  Patient with acute respiratory failure. EXAM: PORTABLE CHEST 1 VIEW COMPARISON:  Chest radiograph 08/24/2025. FINDINGS: Right anterior chest wall Port-A-Cath is present, stable in position with tip projecting over the right atrium. Multiple monitoring leads overlie the patient. Stable cardiac and mediastinal contours. Grossly unchanged bilateral left-greater-than-right hazy pulmonary opacities. No definite pleural effusion or pneumothorax. Low lung volumes. IMPRESSION: Stable support apparatus. Stable hazy opacities bilaterally which may represent edema or infection. Electronically Signed   By: Lovey Newcomer M.D.   On: 08/26/2015 07:03   Dg Chest Port 1 View  08/25/2015  CLINICAL DATA:  Acute onset of dyspnea. Decreased O2 saturation. Initial encounter. EXAM: PORTABLE CHEST 1 VIEW COMPARISON:  Chest radiograph performed 08/24/2015 FINDINGS: The lungs are hypoexpanded. Vascular crowding and vascular congestion are noted. Hazy bilateral airspace opacification is improved from the prior study, and may reflect pneumonia or pulmonary edema. Residual ARDS cannot be excluded. No pleural effusion or pneumothorax is seen. The cardiomediastinal silhouette is normal in size. A right-sided chest port is noted ending about the proximal right atrium. No acute osseous abnormalities are seen. IMPRESSION: Lungs hypoexpanded. Vascular congestion noted. Hazy bilateral airspace opacification is improved from the prior study, and may reflect pneumonia or pulmonary edema. Residual ARDS cannot be excluded. Electronically Signed   By: Garald Balding M.D.   On: 08/25/2015 00:46   Dg Chest Port 1 View  08/24/2015  CLINICAL DATA:  Worsening shortness of breath since yesterday. Hx of tachycardia, asthma, pneumonia.  Gastric carcinoma. Lupus and Sjogren's disease. EXAM: PORTABLE CHEST 1 VIEW COMPARISON:  08/16/2015 FINDINGS: Low lung volumes are again demonstrated. Worsening diffuse bilateral pulmonary airspace disease is seen. No evidence of pneumothorax or pleural effusion. Heart size remains within normal limits. Left-sided Port-A-Cath remains in appropriate position. IMPRESSION: Worsening bilateral diffuse airspace disease. Electronically Signed   By: Earle Gell M.D.   On: 08/24/2015 18:25   Dg Chest Port 1 View  08/16/2015  CLINICAL DATA:  Stomach cancer, diarrhea for 2 weeks, nausea, vomiting EXAM: PORTABLE CHEST 1 VIEW COMPARISON:  05/14/2015 FINDINGS: There are bilateral low lung volumes. There is bibasilar airspace disease which may reflect atelectasis versus pneumonia. There is no pleural effusion or pneumothorax. The heart and mediastinal contours are unremarkable. There is a right-sided Port-A-Cath in satisfactory position. The osseous structures are unremarkable. IMPRESSION: Low lung volumes. Bibasilar airspace disease which may reflect atelectasis versus pneumonia.  Electronically Signed   By: Kathreen Devoid   On: 08/16/2015 16:59    Microbiology: Recent Results (from the past 240 hour(s))  Culture, blood (routine x 2)     Status: None (Preliminary result)   Collection Time: 08/24/15  5:30 PM  Result Value Ref Range Status   Specimen Description PORTA CATH  Final   Special Requests BOTTLES DRAWN AEROBIC AND ANAEROBIC 5CC  Final   Culture   Final    NO GROWTH 4 DAYS Performed at [facility]    Report Status PENDING  Incomplete  Urine culture     Status: Abnormal   Collection Time: 08/24/15  6:42 PM  Result Value Ref Range Status   Specimen Description URINE, CATHETERIZED  Final   Special Requests NONE  Final   Culture MULTIPLE SPECIES PRESENT, SUGGEST RECOLLECTION (A)  Final   Report Status 08/26/2015 FINAL  Final  Culture, blood (routine x 2)     Status: None (Preliminary result)    Collection Time: 08/24/15 11:31 PM  Result Value Ref Range Status   Specimen Description BLOOD LEFT ARM  Final   Special Requests IN PEDIATRIC BOTTLE 3CC  Final   Culture   Final    NO GROWTH 3 DAYS Performed at [facility]    Report Status PENDING  Incomplete     Labs: Basic Metabolic Panel:  Recent Labs Lab 08/24/15 1742 08/25/15 0540 08/26/15 0500 08/27/15 0505 08/28/15 0206  NA 140 141 140 140 138  K 3.2* 3.1* 4.0 3.9 3.4*  CL 117* 116* 114* 114* 111  CO2 19* 22 21* 21* 24  GLUCOSE 120* 138* 139* 127* 99  BUN 17 16 16 18 20   CREATININE 0.85 1.01* 0.89 0.85 0.68  CALCIUM 9.1 9.0 9.0 9.4 9.3  MG  --  1.6* 2.1  --   --   PHOS  --  2.5  --   --   --    Liver Function Tests:  Recent Labs Lab 08/24/15 1742 08/25/15 0540  AST 28 23  ALT 13* 13*  ALKPHOS 115 105  BILITOT 0.9 0.6  PROT 5.1* 4.9*  ALBUMIN 1.7* 1.6*   No results for input(s): LIPASE, AMYLASE in the last 168 hours. No results for input(s): AMMONIA in the last 168 hours. CBC:  Recent Labs Lab 08/24/15 1742 08/25/15 0540 08/26/15 0500 08/27/15 0505 08/28/15 0206  WBC 36.9* 28.5* 36.7* 45.3* 36.1*  NEUTROABS 33.6*  --   --   --   --   HGB 7.3* 7.4* 7.8* 7.7* 7.5*  HCT 22.0* 22.7* 23.4* 23.8* 23.7*  MCV 87.3 88.0 88.3 90.5 91.9  PLT 124* 114* 102* 114* 109*   Cardiac Enzymes:  Recent Labs Lab 08/24/15 1742  TROPONINI <0.03   BNP: BNP (last 3 results)  Recent Labs  01/06/15 1450 03/07/15 0236 08/24/15 2331  BNP 108.2* 94.7 324.7*    ProBNP (last 3 results) No results for input(s): PROBNP in the last 8760 hours.  CBG:  Recent Labs Lab 08/27/15 0436 08/27/15 0831 08/27/15 1210 08/27/15 2117 08/28/15 0751  GLUCAP 123* 125* 159* 86 99       Signed:  Debbe Odea, MD [facility] 08/28/2015, 9:43 AM

## 2015-08-29 LAB — CULTURE, BLOOD (ROUTINE X 2): Culture: NO GROWTH

## 2015-08-30 LAB — CULTURE, BLOOD (ROUTINE X 2): Culture: NO GROWTH

## 2015-08-30 LAB — GLUCOSE, CAPILLARY: Glucose-Capillary: 113 mg/dL — ABNORMAL HIGH (ref 65–99)

## 2015-09-01 ENCOUNTER — Encounter: Payer: Self-pay | Admitting: *Deleted

## 2015-09-01 NOTE — Progress Notes (Signed)
Oncology Nurse Navigator Documentation  Oncology Nurse Navigator Flowsheets 09/01/2015  Navigator Location [facility]  Navigator Encounter Type Letter/Fax/Email  Patient Visit Type -  Treatment Phase -  Barriers/Navigation Needs -  Education -  Interventions Coordination of Care  Coordination of Care Other--Faxed discharge summary to Dr. Delorise Shiner [phone]  Acuity -  Time Spent with Patient -

## 2015-09-06 ENCOUNTER — Encounter: Payer: Self-pay | Admitting: *Deleted

## 2015-09-06 ENCOUNTER — Telehealth: Payer: Self-pay

## 2015-09-06 NOTE — Progress Notes (Signed)
Return call placed to April with Dr. Zadie Rhine inquiring about patients leg edema.  Message left for April to inform her that patient is being seen in [place] at this time and has no f/u appointment scheduled with Dr. Benay Spice and that Dr. Benay Spice will assist Dr. Zadie Rhine in any way that he can.  Informed April to call [facility] back if further assistance is needed.

## 2015-09-06 NOTE — Telephone Encounter (Addendum)
April with Dr Zadie Rhine was calling about swelling in pt ankles since Tuesday of last week. Seeking oncology input .April's call back number [phone]. Attempted to call back x2. Left Dr Gearldine Shown nurse's number.

## 2015-09-07 ENCOUNTER — Telehealth: Payer: Self-pay | Admitting: *Deleted

## 2015-09-07 NOTE — Telephone Encounter (Signed)
Dr. Benay Spice discussed case with Dr. Zadie Rhine. This RN left voicemail for pt to call office. Will arrange follow up in this office if pt agrees.

## 2015-09-07 NOTE — Telephone Encounter (Signed)
Message from April with Dr. Zadie Rhine stating Dr. Zadie Rhine would like to speak with Dr. Benay Spice. "She is pretty ill and needs a local oncologist in addition to oncologist in [place]. Dr. Zadie Rhine has only seen her twice." Returned call for contact info. She asks that we call through triage. Will make Dr. Benay Spice aware.

## 2015-09-09 NOTE — Telephone Encounter (Signed)
Left message on voicemail for pt's mother to call office. Will arrange follow up here if pt agrees.

## 2015-09-10 ENCOUNTER — Telehealth: Payer: Self-pay | Admitting: Nurse Practitioner

## 2015-09-10 NOTE — Telephone Encounter (Signed)
This provider called patient's mother for follow-up.  Patient's mother stated that patient is doing very well this week.  She stated that the patient was discharged from hospital on June 3; and is now feeling much better.  She states that the patient's pain is much better under control; and she is both eating and drinking fairly well.  Her mother said that she was actually progressively improving.  Asked and offered if there was anything that the cancer Center could do for the patient; and patient's mother commonly declined.  Advised patient's mother that we would be available to offer any assistance if the patient should need.  Patient's mother was very thankful for the phone call today.

## 2015-09-10 NOTE — Telephone Encounter (Signed)
This provider called and

## 2015-09-16 ENCOUNTER — Other Ambulatory Visit: Payer: Self-pay | Admitting: Nurse Practitioner

## 2015-10-26 DEATH — deceased

## 2016-01-19 ENCOUNTER — Encounter (HOSPITAL_COMMUNITY): Payer: Self-pay
